# Patient Record
Sex: Female | Born: 1950 | Race: Black or African American | Hispanic: No | Marital: Single
Health system: Midwestern US, Community
[De-identification: ages and names within clinical notes are randomized; demographics above are authoritative.]

## PROBLEM LIST (undated history)

## (undated) DIAGNOSIS — Z9889 Other specified postprocedural states: Secondary | ICD-10-CM

## (undated) DIAGNOSIS — Z889 Allergy status to unspecified drugs, medicaments and biological substances status: Secondary | ICD-10-CM

## (undated) DIAGNOSIS — Z8719 Personal history of other diseases of the digestive system: Secondary | ICD-10-CM

## (undated) DIAGNOSIS — F32A Depression, unspecified: Secondary | ICD-10-CM

## (undated) DIAGNOSIS — T8859XA Other complications of anesthesia, initial encounter: Secondary | ICD-10-CM

## (undated) DIAGNOSIS — E039 Hypothyroidism, unspecified: Secondary | ICD-10-CM

## (undated) DIAGNOSIS — K219 Gastro-esophageal reflux disease without esophagitis: Secondary | ICD-10-CM

## (undated) DIAGNOSIS — T4145XA Adverse effect of unspecified anesthetic, initial encounter: Secondary | ICD-10-CM

## (undated) DIAGNOSIS — F419 Anxiety disorder, unspecified: Secondary | ICD-10-CM

## (undated) DIAGNOSIS — D649 Anemia, unspecified: Secondary | ICD-10-CM

## (undated) DIAGNOSIS — M810 Age-related osteoporosis without current pathological fracture: Secondary | ICD-10-CM

## (undated) DIAGNOSIS — H269 Unspecified cataract: Secondary | ICD-10-CM

## (undated) DIAGNOSIS — E079 Disorder of thyroid, unspecified: Secondary | ICD-10-CM

## (undated) DIAGNOSIS — R112 Nausea with vomiting, unspecified: Secondary | ICD-10-CM

## (undated) DIAGNOSIS — Z972 Presence of dental prosthetic device (complete) (partial): Secondary | ICD-10-CM

## (undated) DIAGNOSIS — B019 Varicella without complication: Secondary | ICD-10-CM

## (undated) DIAGNOSIS — M1990 Unspecified osteoarthritis, unspecified site: Secondary | ICD-10-CM

## (undated) DIAGNOSIS — Z8489 Family history of other specified conditions: Secondary | ICD-10-CM

## (undated) DIAGNOSIS — F329 Major depressive disorder, single episode, unspecified: Secondary | ICD-10-CM

## (undated) DIAGNOSIS — E785 Hyperlipidemia, unspecified: Secondary | ICD-10-CM

## (undated) DIAGNOSIS — E7800 Pure hypercholesterolemia, unspecified: Secondary | ICD-10-CM

## (undated) DIAGNOSIS — E119 Type 2 diabetes mellitus without complications: Secondary | ICD-10-CM

## (undated) DIAGNOSIS — M25551 Pain in right hip: Secondary | ICD-10-CM

## (undated) DIAGNOSIS — Z1231 Encounter for screening mammogram for malignant neoplasm of breast: Secondary | ICD-10-CM

## (undated) DIAGNOSIS — E876 Hypokalemia: Secondary | ICD-10-CM

## (undated) DIAGNOSIS — R197 Diarrhea, unspecified: Secondary | ICD-10-CM

## (undated) DIAGNOSIS — M545 Low back pain, unspecified: Secondary | ICD-10-CM

## (undated) DIAGNOSIS — I10 Essential (primary) hypertension: Secondary | ICD-10-CM

## (undated) DIAGNOSIS — Z471 Aftercare following joint replacement surgery: Secondary | ICD-10-CM

## (undated) DIAGNOSIS — K909 Intestinal malabsorption, unspecified: Secondary | ICD-10-CM

## (undated) DIAGNOSIS — Z01818 Encounter for other preprocedural examination: Secondary | ICD-10-CM

## (undated) DIAGNOSIS — E875 Hyperkalemia: Secondary | ICD-10-CM

## (undated) DIAGNOSIS — K912 Postsurgical malabsorption, not elsewhere classified: Secondary | ICD-10-CM

## (undated) DIAGNOSIS — G8929 Other chronic pain: Secondary | ICD-10-CM

## (undated) DIAGNOSIS — R0609 Other forms of dyspnea: Secondary | ICD-10-CM

## (undated) DIAGNOSIS — M1710 Unilateral primary osteoarthritis, unspecified knee: Secondary | ICD-10-CM

## (undated) DIAGNOSIS — M129 Arthropathy, unspecified: Secondary | ICD-10-CM

## (undated) DIAGNOSIS — J449 Chronic obstructive pulmonary disease, unspecified: Secondary | ICD-10-CM

## (undated) DIAGNOSIS — R55 Syncope and collapse: Secondary | ICD-10-CM

## (undated) DIAGNOSIS — G8918 Other acute postprocedural pain: Secondary | ICD-10-CM

## (undated) DIAGNOSIS — I739 Peripheral vascular disease, unspecified: Secondary | ICD-10-CM

## (undated) DIAGNOSIS — Z23 Encounter for immunization: Secondary | ICD-10-CM

## (undated) DIAGNOSIS — Z1211 Encounter for screening for malignant neoplasm of colon: Secondary | ICD-10-CM

## (undated) DIAGNOSIS — Z96659 Presence of unspecified artificial knee joint: Secondary | ICD-10-CM

## (undated) DIAGNOSIS — Z0000 Encounter for general adult medical examination without abnormal findings: Secondary | ICD-10-CM

## (undated) DIAGNOSIS — M24662 Ankylosis, left knee: Secondary | ICD-10-CM

## (undated) DIAGNOSIS — M1712 Unilateral primary osteoarthritis, left knee: Secondary | ICD-10-CM

## (undated) DIAGNOSIS — B372 Candidiasis of skin and nail: Secondary | ICD-10-CM

## (undated) DIAGNOSIS — K9089 Other intestinal malabsorption: Principal | ICD-10-CM

## (undated) DIAGNOSIS — E611 Iron deficiency: Secondary | ICD-10-CM

## (undated) DIAGNOSIS — Z780 Asymptomatic menopausal state: Principal | ICD-10-CM

## (undated) HISTORY — PX: EYE SURGERY: SHX253

## (undated) HISTORY — PX: THYROID SURGERY: SHX805

## (undated) HISTORY — DX: Anxiety disorder, unspecified: F41.9

## (undated) HISTORY — PX: HIP SURGERY: SHX245

## (undated) HISTORY — PX: CATARACT EXTRACTION: SUR2

## (undated) HISTORY — PX: FOOT SURGERY: SHX648

## (undated) HISTORY — DX: Unspecified cataract: H26.9

## (undated) HISTORY — PX: OTHER SURGICAL HISTORY: SHX169

## (undated) HISTORY — DX: Age-related osteoporosis without current pathological fracture: M81.0

---

## 1979-05-19 HISTORY — PX: TUBAL LIGATION: SHX77

## 1990-05-18 HISTORY — PX: TMJ ARTHROPLASTY: SHX1066

## 1999-04-03 ENCOUNTER — Other Ambulatory Visit: Admission: RE | Admit: 1999-04-03 | Discharge: 1999-04-03 | Payer: Self-pay | Admitting: Psychiatry

## 1999-07-15 ENCOUNTER — Other Ambulatory Visit: Admission: RE | Admit: 1999-07-15 | Discharge: 1999-07-15 | Payer: Self-pay | Admitting: *Deleted

## 1999-10-16 ENCOUNTER — Encounter: Payer: Self-pay | Admitting: Obstetrics and Gynecology

## 1999-10-20 ENCOUNTER — Encounter (INDEPENDENT_AMBULATORY_CARE_PROVIDER_SITE_OTHER): Payer: Self-pay

## 1999-10-20 ENCOUNTER — Inpatient Hospital Stay (HOSPITAL_COMMUNITY): Admission: EM | Admit: 1999-10-20 | Discharge: 1999-10-23 | Payer: Self-pay | Admitting: Obstetrics and Gynecology

## 1999-10-21 ENCOUNTER — Encounter: Payer: Self-pay | Admitting: Obstetrics and Gynecology

## 1999-10-30 ENCOUNTER — Inpatient Hospital Stay (HOSPITAL_COMMUNITY): Admission: AD | Admit: 1999-10-30 | Discharge: 1999-10-30 | Payer: Self-pay | Admitting: Obstetrics and Gynecology

## 2000-03-22 ENCOUNTER — Ambulatory Visit (HOSPITAL_COMMUNITY): Admission: RE | Admit: 2000-03-22 | Discharge: 2000-03-22 | Payer: Self-pay | Admitting: Family Medicine

## 2000-05-18 HISTORY — PX: ABDOMINAL HYSTERECTOMY: SHX81

## 2000-12-24 ENCOUNTER — Other Ambulatory Visit: Admission: RE | Admit: 2000-12-24 | Discharge: 2000-12-24 | Payer: Self-pay | Admitting: Obstetrics and Gynecology
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
## 2001-06-13 ENCOUNTER — Inpatient Hospital Stay (HOSPITAL_COMMUNITY): Admission: EM | Admit: 2001-06-13 | Discharge: 2001-06-17 | Payer: Self-pay | Admitting: *Deleted

## 2001-06-20 ENCOUNTER — Other Ambulatory Visit (HOSPITAL_COMMUNITY): Admission: RE | Admit: 2001-06-20 | Discharge: 2001-06-28 | Payer: Self-pay | Admitting: *Deleted

## 2001-07-29 NOTE — ED Provider Notes (Signed)
[facility]                      EMERGENCY DEPARTMENT TREATMENT REPORT   NAME:  Cheryl Paul, Cheryl Paul   MR #:         BILLING #: [id number]          DOS: 07/29/2001   TIME:10:18 P   [id number]   cc:    Darnelle Catalan, M.D.   Primary Physician:  Darnelle Catalan, M.D.   The patient was evaluated at 2150 hours.   CHIEF COMPLAINT:  Abdominal pain.   HISTORY OF PRESENT ILLNESS:  Cheryl Paul is a 51 year old female with a 1-2   month history of generalized abdominal pain.  It has become worse over the   last 2-3 weeks.  She describes it as a crampy pain in her upper abdomen.   She also has vomiting 2-3 times per day and diarrhea 1 loose nonformed   stool per day.  She does have nausea, admits to subjective chills but no   fevers.  Her symptoms are worse at nighttime when she is lying down.  She   has no chest pain or difficulty breathing.  She has an appointment with Dr.   Shon Baton, a GI physician, on March 18th.  She denies dysuria.  Her abdomen   does not feel distended.   REVIEW OF SYSTEMS:   CONSTITUTIONAL:  Positive chills.  No weight loss, no fevers.   ENT: No sore throat, runny nose or other URI symptoms.   ENDOCRINE:  No diabetic symptoms.   HEMATOLOGIC/LYMPHATIC:  No excessive bruising or lymph node swelling.   RESPIRATORY:  No cough, shortness of breath, or wheezing.   CARDIOVASCULAR:  No chest pain, chest pressure, or palpitations.   GENITOURINARY:  No dysuria, frequency, or urgency.   NEUROLOGICAL:  No headaches, sensory or motor symptoms.   PAST MEDICAL HISTORY:   Gastroesophageal reflux disease, asthma.   MEDICATIONS:  Protonix, albuterol, Proventil, Pepcid.  She was on this   twice a day, but is currently taking it as needed for the last 2-3 weeks.   ALLERGIES:  None.   SOCIAL HISTORY:  Negative.   PAST SURGICAL HISTORY:  Multiple abdominal surgeries including a recent   repair of incarcerated hernia 2 months ago.   PHYSICAL EXAMINATION:    VITAL SIGNS:  Blood pressure 169/85, pulse 77, respirations 20, temperature   99.2.  Pain 9/10.   GENERAL:  This is an obese black female, laying on her side and rubbing her   stomach.   HEENT:  Eyes:  Conjunctivae clear, lids normal.  Pupils equal, symmetrical,   and normally reactive.    Ears/Nose:  Hearing is grossly intact to voice.   Internal and external examinations of the ears are unremarkable.   Mouth/Throat:  Surfaces of the pharynx, palate, and tongue are pink, moist,   and without lesions.   NECK:  Supple, nontender, symmetrical, no masses or JVD, trachea midline,   thyroid not enlarged, nodular, or tender.   LYMPHATICS:   No cervical or submandibular lymphadenopathy palpated.   RESPIRATORY:  Clear and equal BS.  No respiratory distress, tachypnea, or   accessory muscle use.   CARDIOVASCULAR:  Heart regular, without murmurs, gallops, rubs, or thrills.   PMI not displaced.   No peripheral edema or significant varicosities.   GI:  Abdomen soft, nontender, without complaint of pain to palpation.  No   hepatomegaly or splenomegaly.  No abdominal or inguinal  masses appreciated   by inspection or palpation.   Rectal:  No masses or hemorrhoids.  Sphincter tone is normal.   Stool   brown, guaiac negative.   MUSCULOSKELETAL:  Nails:  No clubbing or deformities.  Nailbeds pink with   prompt capillary refill.   SKIN:  Warm and dry without rashes.   IMPRESSION/MANAGEMENT PLAN:  This is an acute exacerbation of a chronic   condition for this patient.  The patient with intermittent abdominal pain   for the last 2-3 months, scheduled to see a GI physician in 4 days.  She   has changed her Pepcid from twice a day to as needed.  Symptoms do sound   reflux in etiology.  They are not consistent with small bowel obstruction.   She does not have an acute abdomen on examination.  At this point I will be   treating her symptoms and following her examination in the emergency   department.  Nursing notes were reviewed.    CONTINUATION BY DR. Denny Levy:   COURSE IN THE EMERGENCY DEPARTMENT:  The patient was given 1 liter of IV   fluids.  She was also given Phenergan 25 mg IV.  She had no further   vomiting in the emergency department. Her nausea resolved. Her abdominal   exam remained soft with no rebound or guarding.   FINAL DIAGNOSIS:  Acute exacerbation of chronic epigastric abdominal pain.   DISPOSITION:  The patient is discharged home in stable condition, with   instructions to follow up with their regular doctor.  They are advised to   return immediately for any worsening or symptoms of concern. She is to   follow up with Dr. Shon Baton as scheduled on March 18.  She is to return to   the   emergency department for significantly worsening pain, vomiting, or fevers.   She was given a prescription for Phenergan 25 mg 1 every 6 hours as needed   for nausea and vomiting.   Electronically Signed By:   Octavia Bruckner, M.D. 07/31/2001 06:30   ____________________________   Octavia Bruckner, M.D.   jb/cd  D:  07/29/2001  T:  07/30/2001  3:32 P   [id number]

## 2002-02-12 NOTE — ED Provider Notes (Signed)
[facility]                      EMERGENCY DEPARTMENT TREATMENT REPORT   NAME:  Cheryl Paul, Cheryl Paul                        PT. LOCATION:     ER  [phone]   MR #:         BILLING #: [id number]          DOS: 02/12/2002   TIME: 6:35 P   32-81-01   cc:  Colbert Ewing, M.D.   Primary Physician:   CHIEF COMPLAINT:   Nausea, vomiting, diarrhea, and abdominal pain.   HISTORY OF PRESENT ILLNESS:   51-year-old woman started on the 26th with   nausea, followed by vomiting and diarrhea.  She has some diffuse crampy   abdominal pain that gets better with emesis or diarrhea.  No localized   abdominal pain.  Was lightheaded with standing earlier, not at this moment.   Has vomited 5 or 6 times today.  Has had 2 episodes of diarrhea, no blood   or dark material at either end.   ADDITIONAL REVIEW OF SYSTEMS:   GENITOURINARY:  No dysuria, frequency, or urgency.   CONSTITUTIONAL:  No fever, chills, weight loss.   ENT: No sore throat, runny nose or other URI symptoms.   RESPIRATORY:  No cough, shortness of breath, or wheezing.   CARDIOVASCULAR:  No chest pain, chest pressure, or palpitations.   MUSCULOSKELETAL:   No injuries.   Denies complaints in any other system.   PAST MEDICAL HISTORY:   She has asthma and bronchitis.  States her asthma   is doing well.   PAST SURGICAL HISTORY:  Multiple including hysterectomy.   FAMILY HISTORY:   No one else ill.   SOCIAL HISTORY:  Negative for current tobacco or alcohol use.   ALLERGIES:    None.   MEDICATIONS:   Albuterol p.r.n.   PHYSICAL EXAMINATION:   VITAL SIGNS:   Blood pressure 109/06, pulse 87, respirations 16,   temperature 99.3, oxygen saturation not recorded.   GENERAL:  This is an obese 51 year old woman in minimal distress.   HEENT:  Pupils equal and reactive, extraocular movements normal.  Nose and   throat unremarkable.   NECK:  Supple, no significant cervical lymphadenopathy.  No JVD.    LUNGS:  Clear and equal breath sounds.  No respiratory distress or   tachypnea.   HEART:  Regular without significant murmurs, gallops, or rubs.   BACK:  No CVAT.   ABDOMEN:  Soft with mild diffuse and inconsistent complaints of pain to   palpation. No tenderness, rebound tenderness, masses.   EXTREMITIES:   Calves soft and nontender.  No peripheral edema.  Pulses   satisfactory.   SKIN:  Warm and dry without rashes.   PSYCHIATRIC:   Judgment appears appropriate.  Recent and remote memory   appear to be intact.   IMPRESSION/MANAGEMENT PLAN:   Acute gastroenteritis with volume depletion   by history.  We will provide some IV fluids, antiemetics and antidiarrheals   and reassess.  We will also evaluate for electrolyte abnormalities and   anemia.   CONTINUATION BY DR. FRUMKIN:   COURSE IN THE EMERGENCY DEPARTMENT:  On re-evaluation at 1934 hours, the   patient had no more nausea or vomiting and no diarrhea.  She still had   about a half liter of  IV to go.  At 2100 hours she vomited after some   Mylanta, her pain was better, and she was nontender at that point.  After   that point, she had been given IV fluids, Reglan, Imodium and sips of   liquids.  She was subsequently given Benadryl, Haloperidol, Carafate, and   Mylanta, followed by the vomiting episode.  Subsequently given 12.5 mg of   IV Phenergan with complete resolution of her symptoms.  At the completion   of her therapy, she had no diarrhea, no nausea, no abdominal pain, no   tenderness.   DIAGNOSTIC IMPRESSIONS:   1.   Acute gastroenteritis with volume depletion.   2. Abdominal pain secondary to #1, improved.   DISPOSITION:  The patient was given verbal and written instructions in the   signs and symptoms of gastrointestinal bleeding and the use of liquid   antacids.    To return specifically for fever, continued vomiting,   increasing pain or orthostasis.  Referred back to her private physician if   unimproved in    1 day.  Return to work 10-01.  Prescription:  Phenergan suppositories 25   mg, #2, with 2 refills.  The patient is discharged with verbal and written   instructions and a referral for ongoing care.  The patient is aware that   they may return at any time for new or worsening symptoms.   Electronically Signed By:   Shanna Cisco, M.D. 02/16/2002 11:21   ____________________________   Shanna Cisco, M.D.   ec/jb  D:  02/12/2002  T:  02/13/2002  3:15 P   [id number]

## 2002-03-17 ENCOUNTER — Other Ambulatory Visit: Admission: RE | Admit: 2002-03-17 | Discharge: 2002-03-17 | Payer: Self-pay | Admitting: Obstetrics and Gynecology

## 2002-08-30 ENCOUNTER — Encounter: Admission: RE | Admit: 2002-08-30 | Discharge: 2002-08-30 | Payer: Self-pay | Admitting: Family Medicine

## 2002-08-30 ENCOUNTER — Encounter: Payer: Self-pay | Admitting: Family Medicine

## 2002-11-20 ENCOUNTER — Encounter: Payer: Self-pay | Admitting: Emergency Medicine

## 2002-11-20 ENCOUNTER — Emergency Department (HOSPITAL_COMMUNITY): Admission: EM | Admit: 2002-11-20 | Discharge: 2002-11-21 | Payer: Self-pay | Admitting: *Deleted

## 2002-12-06 ENCOUNTER — Ambulatory Visit (HOSPITAL_COMMUNITY): Admission: RE | Admit: 2002-12-06 | Discharge: 2002-12-06 | Payer: Self-pay | Admitting: Neurology

## 2003-04-16 ENCOUNTER — Other Ambulatory Visit: Admission: RE | Admit: 2003-04-16 | Discharge: 2003-04-16 | Payer: Self-pay | Admitting: Obstetrics and Gynecology

## 2004-04-29 ENCOUNTER — Other Ambulatory Visit: Admission: RE | Admit: 2004-04-29 | Discharge: 2004-04-29 | Payer: Self-pay | Admitting: Obstetrics and Gynecology

## 2004-05-18 HISTORY — PX: CHOLECYSTECTOMY: SHX55

## 2005-03-11 ENCOUNTER — Encounter (INDEPENDENT_AMBULATORY_CARE_PROVIDER_SITE_OTHER): Payer: Self-pay | Admitting: Specialist

## 2005-03-11 ENCOUNTER — Ambulatory Visit (HOSPITAL_COMMUNITY): Admission: RE | Admit: 2005-03-11 | Discharge: 2005-03-12 | Payer: Self-pay | Admitting: General Surgery

## 2005-04-13 ENCOUNTER — Ambulatory Visit (HOSPITAL_COMMUNITY): Admission: RE | Admit: 2005-04-13 | Discharge: 2005-04-13 | Payer: Self-pay | Admitting: General Surgery

## 2005-05-04 ENCOUNTER — Ambulatory Visit (HOSPITAL_COMMUNITY): Admission: RE | Admit: 2005-05-04 | Discharge: 2005-05-04 | Payer: Self-pay | Admitting: Gastroenterology

## 2005-05-13 ENCOUNTER — Ambulatory Visit (HOSPITAL_COMMUNITY): Admission: RE | Admit: 2005-05-13 | Discharge: 2005-05-13 | Payer: Self-pay | Admitting: Gastroenterology

## 2005-05-18 HISTORY — PX: LIVER BIOPSY: SHX301

## 2005-05-31 ENCOUNTER — Emergency Department (HOSPITAL_COMMUNITY): Admission: EM | Admit: 2005-05-31 | Discharge: 2005-05-31 | Payer: Self-pay | Admitting: Emergency Medicine

## 2005-06-09 ENCOUNTER — Other Ambulatory Visit: Admission: RE | Admit: 2005-06-09 | Discharge: 2005-06-09 | Payer: Self-pay | Admitting: Obstetrics and Gynecology

## 2005-06-16 ENCOUNTER — Ambulatory Visit (HOSPITAL_COMMUNITY): Admission: RE | Admit: 2005-06-16 | Discharge: 2005-06-16 | Payer: Self-pay | Admitting: Gastroenterology

## 2005-11-13 ENCOUNTER — Encounter (INDEPENDENT_AMBULATORY_CARE_PROVIDER_SITE_OTHER): Payer: Self-pay | Admitting: Specialist

## 2005-11-13 ENCOUNTER — Ambulatory Visit (HOSPITAL_COMMUNITY): Admission: RE | Admit: 2005-11-13 | Discharge: 2005-11-13 | Payer: Self-pay | Admitting: Gastroenterology

## 2006-06-24 ENCOUNTER — Ambulatory Visit: Payer: Self-pay | Admitting: Cardiovascular Disease

## 2007-02-28 ENCOUNTER — Ambulatory Visit: Payer: Self-pay | Admitting: Internal Medicine

## 2007-03-07 ENCOUNTER — Ambulatory Visit: Payer: Self-pay

## 2007-03-21 ENCOUNTER — Ambulatory Visit: Payer: Self-pay | Admitting: Internal Medicine

## 2007-05-04 ENCOUNTER — Encounter: Admission: RE | Admit: 2007-05-04 | Discharge: 2007-05-04 | Payer: Self-pay | Admitting: Family Medicine

## 2007-08-01 ENCOUNTER — Encounter: Admission: RE | Admit: 2007-08-01 | Discharge: 2007-08-01 | Payer: Self-pay | Admitting: Obstetrics and Gynecology

## 2007-08-08 ENCOUNTER — Ambulatory Visit (HOSPITAL_COMMUNITY): Admission: RE | Admit: 2007-08-08 | Discharge: 2007-08-08 | Payer: Self-pay | Admitting: Physician Assistant

## 2007-08-15 DIAGNOSIS — R0789 Other chest pain: Secondary | ICD-10-CM | POA: Insufficient documentation

## 2007-08-15 DIAGNOSIS — J449 Chronic obstructive pulmonary disease, unspecified: Secondary | ICD-10-CM | POA: Insufficient documentation

## 2007-08-15 DIAGNOSIS — F17200 Nicotine dependence, unspecified, uncomplicated: Secondary | ICD-10-CM | POA: Insufficient documentation

## 2007-08-15 DIAGNOSIS — J4489 Other specified chronic obstructive pulmonary disease: Secondary | ICD-10-CM | POA: Insufficient documentation

## 2007-08-16 ENCOUNTER — Ambulatory Visit: Payer: Self-pay | Admitting: Internal Medicine

## 2007-08-16 DIAGNOSIS — J984 Other disorders of lung: Secondary | ICD-10-CM | POA: Insufficient documentation

## 2007-08-23 ENCOUNTER — Telehealth: Payer: Self-pay | Admitting: Internal Medicine

## 2007-08-24 ENCOUNTER — Ambulatory Visit: Payer: Self-pay | Admitting: Internal Medicine

## 2007-09-15 ENCOUNTER — Telehealth (INDEPENDENT_AMBULATORY_CARE_PROVIDER_SITE_OTHER): Payer: Self-pay | Admitting: *Deleted

## 2007-09-19 ENCOUNTER — Ambulatory Visit: Payer: Self-pay | Admitting: Internal Medicine

## 2007-10-07 ENCOUNTER — Ambulatory Visit: Payer: Self-pay | Admitting: Internal Medicine

## 2008-05-24 LAB — METABOLIC PANEL, COMPREHENSIVE
A-G Ratio: 1.3 (ref 0.8–1.7)
ALT (SGPT): 36 U/L (ref 30–65)
AST (SGOT): 15 U/L (ref 15–37)
Albumin: 4.3 g/dL (ref 3.4–5.0)
Alk. phosphatase: 86 U/L (ref 50–136)
Anion gap: 8 mmol/L (ref 5–15)
BUN/Creatinine ratio: 19 (ref 12–20)
BUN: 13 MG/DL (ref 7–18)
Bilirubin, total: 0.6 MG/DL (ref 0.1–0.9)
CO2: 31 MMOL/L (ref 21–32)
Calcium: 9.5 MG/DL (ref 8.4–10.4)
Chloride: 103 MMOL/L (ref 100–108)
Creatinine: 0.7 MG/DL (ref 0.6–1.3)
GFR est AA: >60 mL/min/{1.73_m2} (ref >60)
GFR est non-AA: >60 mL/min/{1.73_m2} (ref >60)
Globulin: 3.3 g/dL (ref 2.0–4.0)
Glucose: 138 MG/DL — ABNORMAL HIGH (ref 74–99)
Potassium: 4.6 MMOL/L (ref 3.5–5.5)
Protein, total: 7.6 g/dL (ref 6.4–8.2)
Sodium: 142 MMOL/L (ref 136–145)

## 2008-05-24 LAB — CBC WITH AUTOMATED DIFF
ABS. EOSINOPHILS: 0.3 10*3/uL (ref 0.0–0.4)
ABS. LYMPHOCYTES: 2.3 10*3/uL (ref 0.8–3.5)
ABS. MONOCYTES: 0.4 10*3/uL (ref 0–1.0)
ABS. NEUTROPHILS: 5.4 10*3/uL (ref 1.8–8.0)
BASOPHILS: 0 % (ref 0–3)
EOSINOPHILS: 4 % (ref 0–5)
HCT: 43.2 % (ref 36.0–46.0)
HGB: 13.9 g/dL (ref 12.0–16.0)
LYMPHOCYTES: 27 % (ref 20–51)
MCH: 26.9 PG (ref 25.0–35.0)
MCHC: 32.1 g/dL (ref 31.0–37.0)
MCV: 83.7 FL (ref 78.0–102.0)
MONOCYTES: 5 % (ref 2–9)
MPV: 10.9 FL — ABNORMAL HIGH (ref 7.4–10.4)
NEUTROPHILS: 64 % (ref 42–75)
PLATELET: 180 10*3/uL (ref 130–400)
RBC: 5.16 M/uL — ABNORMAL HIGH (ref 4.10–5.10)
RDW: 14.9 % — ABNORMAL HIGH (ref 11.5–14.5)
WBC: 8.4 10*3/uL (ref 4.5–13.0)

## 2008-05-24 LAB — URINALYSIS W/ RFLX MICROSCOPIC
Bilirubin: NEGATIVE
Blood: NEGATIVE
Glucose: NEGATIVE MG/DL
Ketone: NEGATIVE MG/DL
Leukocyte Esterase: NEGATIVE
Nitrites: NEGATIVE
Protein: NEGATIVE MG/DL
Specific gravity: 1.025 (ref 1.003–1.030)
Urobilinogen: 0.2 EU/DL (ref 0.2–1.0)
pH (UA): 5.5 (ref 5.0–8.0)

## 2008-05-24 LAB — LIPID PANEL
CHOL/HDL Ratio: 5.4 — ABNORMAL HIGH (ref 0–5.0)
Cholesterol, total: 260 MG/DL — ABNORMAL HIGH (ref 0–200)
HDL Cholesterol: 48 MG/DL (ref 40–60)
LDL, calculated: 172.8 MG/DL — ABNORMAL HIGH (ref 0–100)
LDL/HDL Ratio: 3.6
Triglyceride: 196 MG/DL — ABNORMAL HIGH (ref 0–150)
VLDL, calculated: 39.2 MG/DL

## 2008-05-24 LAB — HEMOGLOBIN A1C WITH EAG: Hemoglobin A1c: 7.2 % — ABNORMAL HIGH (ref 4.8–6.0)

## 2008-05-24 LAB — TSH 3RD GENERATION: TSH: 1.97 u[IU]/mL (ref 0.51–6.27)

## 2008-05-25 LAB — VITAMIN D, 25 HYDROXY: Vitamin D 25-Hydroxy: 21 ng/mL — ABNORMAL LOW (ref 30–80)

## 2008-08-27 ENCOUNTER — Ambulatory Visit (HOSPITAL_COMMUNITY): Admission: RE | Admit: 2008-08-27 | Discharge: 2008-08-27 | Payer: Self-pay | Admitting: Obstetrics and Gynecology

## 2008-09-03 ENCOUNTER — Encounter: Admission: RE | Admit: 2008-09-03 | Discharge: 2008-09-03 | Payer: Self-pay | Admitting: Neurology

## 2008-09-13 ENCOUNTER — Telehealth: Payer: Self-pay | Admitting: Internal Medicine

## 2008-09-19 ENCOUNTER — Ambulatory Visit: Payer: Self-pay | Admitting: Cardiovascular Disease

## 2008-09-24 ENCOUNTER — Ambulatory Visit: Payer: Self-pay | Admitting: Internal Medicine

## 2008-09-24 DIAGNOSIS — K449 Diaphragmatic hernia without obstruction or gangrene: Secondary | ICD-10-CM | POA: Insufficient documentation

## 2008-09-24 DIAGNOSIS — K219 Gastro-esophageal reflux disease without esophagitis: Secondary | ICD-10-CM | POA: Insufficient documentation

## 2008-10-05 ENCOUNTER — Encounter: Admission: RE | Admit: 2008-10-05 | Discharge: 2008-10-05 | Payer: Self-pay | Admitting: Family Medicine

## 2008-10-31 ENCOUNTER — Ambulatory Visit: Payer: Self-pay | Admitting: Internal Medicine

## 2008-11-15 ENCOUNTER — Telehealth: Payer: Self-pay | Admitting: Internal Medicine

## 2008-11-26 ENCOUNTER — Ambulatory Visit: Payer: Self-pay | Admitting: Internal Medicine

## 2008-12-03 ENCOUNTER — Telehealth (INDEPENDENT_AMBULATORY_CARE_PROVIDER_SITE_OTHER): Payer: Self-pay | Admitting: *Deleted

## 2008-12-27 ENCOUNTER — Ambulatory Visit: Payer: Self-pay | Admitting: Psychology

## 2009-02-14 ENCOUNTER — Ambulatory Visit: Payer: Self-pay | Admitting: Psychology

## 2009-04-12 ENCOUNTER — Emergency Department (HOSPITAL_COMMUNITY): Admission: EM | Admit: 2009-04-12 | Discharge: 2009-04-12 | Payer: Self-pay | Admitting: Emergency Medicine

## 2009-07-22 ENCOUNTER — Encounter: Admission: RE | Admit: 2009-07-22 | Discharge: 2009-07-22 | Payer: Self-pay | Admitting: Family Medicine

## 2009-08-14 ENCOUNTER — Telehealth (INDEPENDENT_AMBULATORY_CARE_PROVIDER_SITE_OTHER): Payer: Self-pay | Admitting: *Deleted

## 2009-08-20 ENCOUNTER — Other Ambulatory Visit: Admission: RE | Admit: 2009-08-20 | Discharge: 2009-08-20 | Payer: Self-pay | Admitting: Interventional Radiology

## 2009-08-20 ENCOUNTER — Encounter: Admission: RE | Admit: 2009-08-20 | Discharge: 2009-08-20 | Payer: Self-pay | Admitting: Family Medicine

## 2009-10-04 ENCOUNTER — Emergency Department (HOSPITAL_COMMUNITY): Admission: EM | Admit: 2009-10-04 | Discharge: 2009-10-04 | Payer: Self-pay | Admitting: Emergency Medicine

## 2009-10-07 ENCOUNTER — Encounter: Admission: RE | Admit: 2009-10-07 | Discharge: 2009-10-07 | Payer: Self-pay | Admitting: Neurological Surgery

## 2009-10-23 ENCOUNTER — Encounter (INDEPENDENT_AMBULATORY_CARE_PROVIDER_SITE_OTHER): Payer: Self-pay | Admitting: Surgery

## 2009-10-23 ENCOUNTER — Ambulatory Visit (HOSPITAL_COMMUNITY): Admission: RE | Admit: 2009-10-23 | Discharge: 2009-10-24 | Payer: Self-pay | Admitting: Surgery

## 2010-03-24 ENCOUNTER — Telehealth (INDEPENDENT_AMBULATORY_CARE_PROVIDER_SITE_OTHER): Payer: Self-pay | Admitting: *Deleted

## 2010-03-27 ENCOUNTER — Ambulatory Visit (HOSPITAL_COMMUNITY): Admission: RE | Admit: 2010-03-27 | Discharge: 2010-03-28 | Payer: Self-pay | Admitting: Orthopedic Surgery

## 2010-05-18 HISTORY — PX: NECK SURGERY: SHX720

## 2010-06-08 ENCOUNTER — Encounter: Payer: Self-pay | Admitting: Internal Medicine

## 2010-06-17 NOTE — Progress Notes (Signed)
Summary: rx req/ cough/ sob  Phone Note Call from Patient   Caller: Patient Call For: ramaswamy Summary of Call: cough "a little better" but still not coughing anything up. also sob. req another "round" of antibiotics. [facility] [phone]. pt cell# [phone]. Initial call taken by: Tivis Ringer,  August 23, 2007 9:06 AM  Follow-up for Phone Call        Pt states her cough is better but she still can't cough up any mucus. She would like another round of the Doxycycline if MR feels it would help. SOB is about the same. Please advise...................................................................Marland KitchenMichel Bickers [facility]  August 23, 2007 10:47 AM  Additional Follow-up for Phone Call Additional follow up Details #1::        Pls check if she is taking spiriva as well. In 03/2007 I had advised spiriva and she forgot about it. Also, sh can have another 5 days of doxycycline 100mg  by mouth two times a day without refills. Please call it in. Next, she is very likely in copd exacerbation. Probably needs prednisone. Can she make a visit today? Or, tommorrow to me or Tammy Parrett? ...................................................................Kalman Shan MD  August 23, 2007 11:29 AM  Additional Follow-up by: Kalman Shan MD,  August 23, 2007 11:29 AM    Additional Follow-up for Phone Call Additional follow up Details #2::    Pt is using Spiriva. She is aware we will call Doxy 100mg  #10 two times a day to [facility]. She is scheduled to see TP on 08-24-07 for ?COPD execerbation per MR. Follow-up by: Michel Bickers CMA,  August 23, 2007 11:49 AM  New/Updated Medications: DOXYCYCLINE HYCLATE 100 MG  TABS (DOXYCYCLINE HYCLATE) 1 by mouth two times a day   Prescriptions: DOXYCYCLINE HYCLATE 100 MG  TABS (DOXYCYCLINE HYCLATE) 1 by mouth two times a day  #10 x 0   Entered by:   Michel Bickers CMA   Authorized by:   Kalman Shan MD   Signed by:   Michel Bickers CMA on 08/23/2007   Method used:    Telephoned to ...       [facility]       [address]       [place], [place]  [address]       Ph: [phone] or [phone]       Fax: [phone]   RxID:   [id number]

## 2010-06-17 NOTE — Assessment & Plan Note (Signed)
Summary: 26yr rov/ct chest@[facility]  09/19/08   Visit Type:  Follow-up Copy to:  Dr. Marisue Brooklyn Primary Provider/Referring Provider:  Dr. Marisue Brooklyn  CC:  Pt here to review CT results..  History of Present Illness: Followup for  02/2007 micronodules seen on CT chest at [facility], COPD  (isolated low dlco, seen on ct chest), and quitting smoking, rt atypical chest pain.  #Smoking - quit smoking 02/06/2008 with help of wellbutrin and psychiatris. has gained weight since  #Rt sided chest pain -  Present in rt interscapular region and right mammary pain (feels like straight to back). Being followed by PCP. No change  #micronodules - had ct 09/19/2008 that was  a followup for lingula nodule when compared to 09/19/2007. THis nodule is regressd now and is a scar but current CT shows new RML pna patch compared to 09/19/2007. However, she has had no pneumonia symptoms. Insstead she is c/o copious GERD with gagging and vomitus. Prevacid as needed has helped. Mom is very concerned about this because her dad died from lung cancer in 2008/05/16.  Mom is also worried that she is aspirating silently. NOTE: I personally reviewed this CT. Info updated in past medical hx  #COPD - Not dyspneic. No wheezing. Does not want to try new inhalers due to lack of symptoms and prior intolerance in form of lip swelling to spiriva.    Preventive Screening-Counseling & Management     Smoking Status: quit     Year Quit: 01/2008  Current Medications (verified): 1)  Estrace 2 Mg  Tabs (Estradiol) .... Take 1 Tablet By Mouth Once A Day 2)  Trazodone Hcl 100 Mg  Tabs (Trazodone Hcl) .... Take 1 Tab By Mouth At Bedtime 3)  Levothroid 25 Mcg  Tabs (Levothyroxine Sodium) .... Take 1 Tablet By Mouth Once A Day 4)  Centrum Silver   Tabs (Multiple Vitamins-Minerals) .... Take 1 Tablet By Mouth Once A Day 5)  Calcium 500 500 Mg  Tabs (Calcium Carbonate) .... Take 1 Tablet By Mouth Two Times A Day 6)  Cymbalta 60 Mg Cpep (Duloxetine Hcl) ....  Take 1 Tablet By Mouth Once A Day 7)  Sleeping Pill? 8)  B-12 1000 Mcg Cr-Tabs (Cyanocobalamin) .... Take 1 Tablet By Mouth Once A Day  Allergies (verified): 1)  ! * Lithium 2)  ! Morphine 3)  ! Sudafed 4)  ! Prednisone  Past History:  Past Medical History:    #PULMONARY NODULE (ICD-518.89)..........Marland KitchenRamswamy    -> CT 10/20098 @ [facility]; Many micronodule <38mm. Largest 5mm    -> CT 05-17-07 - resolution of nodules    -> CT 09/19/2007 - resolution of noudles above but new Lingular nodule 5mm or so    -> CT 09/19/2008 - lingular nodules has regressed/nearly resolved to a scar. Has new RML pneumonic patch    #C O P D (ICD-496).......Marland KitchenRamaswamy    -> dx based on ilsoated low DLCO + CT chest findings    -> intoleratnt to spiriva (had lip swelling)    -> not on inhaler RX    #TOBACCO ABUSE (ICD-305.1)    -> quit 02/06/2008 with wellbutrin    CHEST PAIN, ATYPICAL (ICD-786.59)    COPD (ICD-496)       Family History:    Reviewed history and no changes required:  Social History:    Reviewed history and no changes required:    Smoking Status:  quit  Review of Systems      See HPI  Vital Signs:  Patient profile:   60 year old female Height:      63 inches Weight:      136 pounds BMI:     24.18 O2 Sat:      97 % Temp:     97.5 degrees F oral Pulse rate:   90 / minute BP sitting:   98 / 60  (right arm) Cuff size:   regular  Vitals Entered By: Carron Curie CMA (Sep 24, 2008 11:27 AM)  O2 Sat at Rest %:  97 O2 Flow:  room air CC: Pt here to review CT results.   Physical Exam  General:  thin.   Head:  normocephalic and atraumatic Eyes:  PERRLA/EOM intact; conjunctiva and sclera clear Ears:  TMs intact and clear with normal canals Nose:  no deformity, discharge, inflammation, or lesions Mouth:  no deformity or lesions Neck:  no masses, thyromegaly, or abnormal cervical nodes Chest Wall:  no deformities noted Lungs:  clear bilaterally to auscultation and  percussion Heart:  regular rate and rhythm, S1, S2 without murmurs, rubs, gallops, or clicks Abdomen:  soft. normal bowel sounds. no mass Msk:  no deformity or scoliosis noted with normal posture Pulses:  pulses normal Extremities:  no clubbing, cyanosis, edema, or deformity noted Neurologic:  CN II-XII grossly intact with normal reflexes, coordination, muscle strength and tone Skin:  intact without lesions or rashes Cervical Nodes:  no significant adenopathy Psych:  alert and cooperative; normal mood and affect; normal attention span and concentration   Impression & Recommendations:  Problem # 1:  PULMONARY NODULE (ICD-518.89) Assessment Deteriorated -> CT 10/20098 @ [facility]; Many micronodule <46mm. Largest 5mm -> CT 04/2007 - resolution of nodules -> CT 09/19/2007 - resolution of noudles above but new Lingular nodule 5mm or so -> CT 09/19/2008 - lingular nodules has regressed/nearly resolved to a scar. Has NEW RML pneumonic patch (Patient asymptomatic exceot for GERD.)  PLAN Repeat ct chest in 6 weeks (mom is very concerned that this is lung cancer v aspiration) No abx Rx as patient asymptomatic from infection standpoint Rx GERD with prevacid Orders: Radiology Referral (Radiology) Est. Patient Level III (16109)  Problem # 2:  G E REFLUX (ICD-530.81) Assessment: New  Having new GERD  ? cause. Sound severe.   plan recommended GI referral but mom refused they will try daily prevacid first  Orders: Est. Patient Level III (60454)  Problem # 3:  C O P D (ICD-496) Assessment: Unchanged  Currently asymptomatic. Intolerant to spiriva. Refuses LABA.   PLAN expectant followup  Problem # 4:  TOBACCO ABUSE (ICD-305.1) Assessment: Improved quit 9/21/20009 Orders: Radiology Referral (Radiology) Est. Patient Level III (09811)  Medications Added to Medication List This Visit: 1)  Cymbalta 60 Mg Cpep (Duloxetine hcl) .... Take 1 tablet by mouth once a day 2)  Sleeping Pill?  3)   B-12 1000 Mcg Cr-tabs (Cyanocobalamin) .... Take 1 tablet by mouth once a day  Patient Instructions: 1)  congratulations on quitting smoking 2)  ct scan chest without contrast 6 weeks from 09/19/2008 3)  take prevacid daily 4)  return after ct chest

## 2010-06-17 NOTE — Progress Notes (Signed)
Summary: fax request  Phone Note From Other Clinic   Caller: donna w/ dr Winfred Burn Call For: [facility] Summary of Call: please fax CT results from 10/31/08 to: attn: donna [phone]. contact # is [phone] Initial call taken by: Tivis Ringer, CNA,  August 14, 2009 10:15 AM  Follow-up for Phone Call        faxed ct/Juanita Follow-up by: Darletta Moll,  August 14, 2009 11:34 AM

## 2010-06-17 NOTE — Assessment & Plan Note (Signed)
Summary: ROV   Visit Type:  Follow-up Referred by:  Dr. Marisue Brooklyn PCP:  Dr. Marisue Brooklyn  Chief Complaint:  fu visit....from CT on May 5th...Marland KitchenMarland KitchenMarland Kitchenreviewed meds....stopped Spiriva....breaking mouth out.  History of Present Illness: Followup for  02/2007 micronodules seen on CT chest at [facility], COPD  (isolated low dlco, seen on ct chest), and quitting smoking, rt atypical chest pain.  #Smoking - was started on chantix by psych but was intolerant to it. So, she discontinued. Subsequently on zyban but was intolerant to that too. Currently, smoking but not any antismoking meds. Sill smoking 1 pack per days  #Rt sided chest pain -  iPresent in rt interscapular region and right mammary pain (feels like straight to back). Been told by PCP it is costochondritis. Being followed by PCP  #micronodules - seen on CT 02/2007 done at [facility] (mostly < 4mm. LArgest 5mm). Had followup CT in Dec 2008 (ordered by Dr. Althea Charon) and May 2009 (ordered by me)  #COPD - in interim, tried spiriva but had tongue swelling despite advice to do mouth wash. So, quit taking spiriva. Not dyspneic. No wheezing. Does not want to try new inhalers due to lack of symptoms  #OVerall - losing weight and mom is very concerned that depression is getting worse. PAtient denies suicidal ideation, hallucination and homicidal thoughts. 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       Updated Prior Medication List: ESTRACE 2 MG  TABS (ESTRADIOL) Take 1 tablet by mouth once a day TRAZODONE HCL 100 MG  TABS (TRAZODONE HCL) Take 1 tab by mouth at bedtime LEVOTHROID 25 MCG  TABS (LEVOTHYROXINE SODIUM) Take 1 tablet by mouth once a day CELEXA 20 MG  TABS (CITALOPRAM HYDROBROMIDE) take 60 mg once daily CENTRUM SILVER   TABS (MULTIPLE VITAMINS-MINERALS) Take 1 tablet by mouth once a day CALCIUM 500 500 MG  TABS (CALCIUM CARBONATE) Take 1 tablet by mouth two times a day VOLTAREN 75 MG  TBEC (DICLOFENAC SODIUM)  two times a day  Current Allergies (reviewed today): ! * LITHIUM ! MORPHINE ! SUDAFED ! PREDNISONE  Past Medical History:    Reviewed history from 08/24/2007 and no changes required:       PULMONARY NODULE (ICD-518.89)       C O P D (ICD-496)       TOBACCO ABUSE (ICD-305.1)       CHEST PAIN, ATYPICAL (ICD-786.59)       COPD (ICD-496)           Family History:    Reviewed history and no changes required:  Social History:    Reviewed history and no changes required:   Risk Factors: Tobacco use:  current    Year started:  trying to quit    Cigarettes:  Yes -- 1/2 pack pack(s) per day   Review of Systems      See HPI   Vital Signs:  Patient Profile:   60 Years Old Female Height:     63 inches Weight:      119.2 pounds BMI:     21.19 O2 Sat:      97 % O2 treatment:    Room Air Temp:     97.7 degrees F oral Pulse rate:   64 / minute BP sitting:   94 / 58  (left arm) Cuff size:   regular  Pt. in pain?   no  Vitals Entered By: Clarise Cruz Duncan Dull) (Oct 07, 2007 11:01 AM)                  Physical Exam  General:     thin.   Head:     normocephalic and atraumatic Eyes:     PERRLA/EOM intact; conjunctiva and sclera clear Ears:     TMs intact and clear with normal canals Nose:     no deformity, discharge, inflammation, or lesions Mouth:     no deformity or lesions Neck:     no masses, thyromegaly, or abnormal cervical nodes Chest Wall:     no deformities noted Lungs:     clear bilaterally to auscultation and percussion Heart:     regular rate and rhythm, S1, S2 without murmurs, rubs, gallops, or clicks Abdomen:     soft. normal bowel sounds. no mass Msk:     no deformity or scoliosis noted with normal posture Pulses:     pulses normal Extremities:     no clubbing, cyanosis, edema, or deformity noted Neurologic:     CN II-XII grossly intact with normal reflexes, coordination, muscle strength and tone Skin:     intact without lesions or  rashes Cervical Nodes:     no significant adenopathy Psych:     alert and cooperative; normal mood and affect; normal attention span and concentration   CT of Chest  Procedure date:  05/04/2007  Findings:      IMPRESSION: (personally reviewed)   1.  Changes of COPD.  No lung nodule, infiltrate, adenopathy, or   effusion.   2.  Small amount of coronary artery calcification.   3.  Somewhat nodular thyroid.  Consider ultrasound of the thyroid to   assess further.   4.  Non-obstructing left lower pole renal calculus.    Read By:  Juline Patch,  M.D.   Released By:  Juline Patch,  M.D.  Additional Information  HL7 RESULT STATUS : F  External image : [phone]  CT of Chest  Procedure date:  09/19/2007  Findings:      IMPRESSION: (personally revieweD)    1.  Tiny left upper lobe nodule is not definitely seen on the prior   study.  This can be followed in 12 months, as clinically indicated.   2.  Right thyroid nodule.  Ultrasound is recommended, as clinically   indicated.   3.  Slight prominence of the right adrenal gland.  Small adenoma   not excluded.    Read By:  Reyes Ivan.,  M.D.   Released By:  Reyes Ivan.,  M.D.    Impression & Recommendations:  Problem # 1:  PULMONARY NODULE (ICD-518.89) Assessment: New Pulm nodules of 02/2007 seen [facility] have resolved completely in 04/2007 Ct chest ordered by Dr. Althea Charon. However, when I saw her in 03/2007 I was unasware this cT was in progress. So, I had her do ct chest in 09/2007 which again shows complete resolution of all nodules from 10/;2008 but there is a new micronodule in Lingula region that is new sine 04/2007 and therefore highly unlikely to be cancer esp given small size. Marland Kitchen   PLAN Ct chest in may 2010 (1 year) Orders: Radiology Referral (Radiology) Est. Patient Level III (84696)   Problem # 2:  C O P D (ICD-496) Currently asymptomatic. Intolerant to spiriva. Refuses LABA. Long  disucssion on rehab and potential side benefits of exercise with quitting smoking and depression. She is willin gto try it out at  [place],  PLAN Pulmonary rehab The following medications were removed from the medication list:    Prednisone 10 Mg Tabs (Prednisone) .Marland KitchenMarland KitchenMarland KitchenMarland Kitchen 4 tabs for 2 days, then 3 tabs for 2 days, 2 tabs for 2 days, then 1 tab for 2 days, then stop  Orders: Radiology Referral (Radiology) Rehabilitation Referral (Rehab) Est. Patient Level III (69629)   Problem # 3:  TOBACCO ABUSE (ICD-305.1) Assessment: Unchanged continues to smoke. Failed chantix and zyban  PLAN Refer rehab Orders: Radiology Referral (Radiology) Rehabilitation Referral (Rehab) Est. Patient Level III ([id number])   Problem # 4:  CHEST PAIN, ATYPICAL (ICD-786.59) Assessment: Unchanged under pcp followup   Patient Instructions: 1)  Next CT chest in may 2010 for nodule followup 2)  Please try to attend pulmnary rehab at [facility] 3)  Next visit in 6-7 months   ]

## 2010-06-17 NOTE — Progress Notes (Signed)
Summary: swollen tongue/ mouth raw  Phone Note Call from Patient   Caller: Patient Call For: ramaswamy Summary of Call: re: spiriva. tongue is swollen (just a little bit per pt). roof of mouth and inside lip is raw. [facility]. [phone].  Initial call taken by: Tivis Ringer,  September 15, 2007 11:43 AM  Follow-up for Phone Call        Pt had not been rinsing mouth after using spiriva.  Instructed pt to start rinsing mouth after each use of spiriva.  Brush mouth  with baking soda.  Call back if symptoms do not improve Follow-up by: Abigail Miyamoto RN,  September 15, 2007 12:01 PM

## 2010-06-17 NOTE — Progress Notes (Signed)
Summary: returned call  Phone Note Call from Patient   Caller: Patient Call For: ramaswamy Summary of Call: pt returned call from jennifer. call cell [phone] Initial call taken by: Tivis Ringer,  November 15, 2008 12:47 PM  Follow-up for Phone Call        pt notified of CT results. Follow-up by: Carron Curie CMA,  November 15, 2008 3:07 PM

## 2010-06-17 NOTE — Progress Notes (Signed)
Summary: pick up disc  Phone Note Call from Patient   Caller: Patient Call For: ramaswamy Summary of Call: pt would like to pick up ct disk. [phone] Initial call taken by: Tivis Ringer,  December 03, 2008 2:53 PM  Follow-up for Phone Call        called and spoke with pt. pt had CT done at [facility].  Informed pt to call Rose at [facility] CT to request copy of CT.  Aundra Millet Reynolds LPN  December 03, 2008 3:51 PM

## 2010-06-17 NOTE — Assessment & Plan Note (Signed)
Summary: copd exacerbation per MR/lc   Referred by:  Dr. Marisue Brooklyn PCP:  Dr. Marisue Brooklyn  Chief Complaint:  increased SOB and dry cough - no better since last OV - 5 day round of doxy was called in yesterday.  History of Present Illness: 60 year old female former smoker with known hx of COPD presents for persistent cough, thick mucus cant get up, Seen last week in office given Doxycycline for 5 days, No better. Denies chest pain, dyspnea, orthopnea, hemoptysis, fever, n/v/d, edema. Doxycycline called in last pm, taken 2 doses, no better.         Prior Medication List:  ESTRACE 2 MG  TABS (ESTRADIOL) Take 1 tablet by mouth once a day TRAZODONE HCL 100 MG  TABS (TRAZODONE HCL) Take 1 tab by mouth at bedtime LEVOTHROID 25 MCG  TABS (LEVOTHYROXINE SODIUM) Take 1 tablet by mouth once a day CELEXA 20 MG  TABS (CITALOPRAM HYDROBROMIDE) take 60 mg once daily CENTRUM SILVER   TABS (MULTIPLE VITAMINS-MINERALS) Take 1 tablet by mouth once a day CALCIUM 500 500 MG  TABS (CALCIUM CARBONATE) Take 1 tablet by mouth two times a day VOLTAREN 75 MG  TBEC (DICLOFENAC SODIUM) two times a day SPIRIVA HANDIHALER 18 MCG  CAPS (TIOTROPIUM BROMIDE MONOHYDRATE) one puffs in handihaler daily DOXYCYCLINE HYCLATE 100 MG  TABS (DOXYCYCLINE HYCLATE) 1 by mouth two times a day   Current Allergies (reviewed today): ! * LITHIUM ! MORPHINE ! SUDAFED ! PREDNISONE  Past Medical History:    Reviewed history and no changes required:       PULMONARY NODULE (ICD-518.89)       C O P D (ICD-496)       TOBACCO ABUSE (ICD-305.1)       CHEST PAIN, ATYPICAL (ICD-786.59)       COPD (ICD-496)           Family History:    Reviewed history and no changes required:  Social History:    Reviewed history and no changes required:   Risk Factors:  Tobacco use:  current    Year started:  trying to quit    Cigarettes:  Yes -- 1/2 pack pack(s) per day   Review of Systems      See HPI   Vital Signs:  Patient  Profile:   60 Years Old Female Height:     63 inches Weight:      122.13 pounds O2 Sat:      94 % O2 treatment:    Room Air Temp:     96.6 degrees F oral Pulse rate:   76 / minute BP sitting:   98 / 42  (right arm) Cuff size:   regular  Vitals Entered By: Boone Master CNA (August 24, 2007 10:17 AM)             Is Patient Diabetic? No Comments Medications reviewed with patient ..................................................................Marland KitchenMalayla Granberry CNA  August 24, 2007 10:17 AM      Physical Exam  No acute distress with stable vital signs. HEENT:  Unremarkable.  Oropharynx is clear. LUNG FIELDS:  Coarse breath sounds with scattered rhonchi. HEART:  There is a regular rhythm without murmur, gallop, rub. ABDOMEN:  Soft, non-tender EXTREMITIES:  Warm without calf tenderness, cyanosis, clubbing, edema.        Problem # 1:  COPD (ICD-496) Slow to resolve exacerbation  Rec: Stop Doxycycline.  Begin Avelox once daily for 7 days (sample given) Mucinex DM two times a  day  Prednisone taper over next week, take with food.  Hold Voltaren until done with prednisone.  Prilosec 20mg  once daily 2 weeks.   Her updated medication list for this problem includes:    Spiriva Handihaler 18 Mcg Caps (Tiotropium bromide monohydrate) ..... One puffs in handihaler daily    Avelox 400 Mg Tabs (Moxifloxacin hcl) .Marland Kitchen... 1 by mouth once daily    Prednisone 10 Mg Tabs (Prednisone) .Marland KitchenMarland KitchenMarland KitchenMarland Kitchen 4 tabs for 2 days, then 3 tabs for 2 days, 2 tabs for 2 days, then 1 tab for 2 days, then stop   Medications Added to Medication List This Visit: 1)  Avelox 400 Mg Tabs (Moxifloxacin hcl) .Marland Kitchen.. 1 by mouth once daily 2)  Prednisone 10 Mg Tabs (Prednisone) .... 4 tabs for 2 days, then 3 tabs for 2 days, 2 tabs for 2 days, then 1 tab for 2 days, then stop   Patient Instructions: 1)  Stop Doxycycline.  2)  Begin Avelox once daily for 7 days (sample given) 3)  Mucinex DM two times a day  4)  Prednisone  taper over next week, take with food.  5)  Hold Voltaren until done with prednisone.  6)  Prilosec 20mg  once daily 2 weeks.  7)  follow up 2-3 weeks Dr. Marchelle Gearing     Prescriptions: PREDNISONE 10 MG  TABS (PREDNISONE) 4 tabs for 2 days, then 3 tabs for 2 days, 2 tabs for 2 days, then 1 tab for 2 days, then stop  #20 x 0   Entered and Authorized by:   Rubye Oaks NP   Signed by:   Tammy Parrett NP on 08/24/2007   Method used:   Print then Give to Patient   RxID:   [id number]  ]

## 2010-06-17 NOTE — Progress Notes (Signed)
Summary: set up CT  Phone Note Call from Patient   Caller: Patient Call For: ramaswamy Summary of Call: pt needs chest CT set up. (per last ov). ok to leave msg on pt's phone as she will be out for the rest of the day. Initial call taken by: Tivis Ringer,  September 13, 2008 10:49 AM  Follow-up for Phone Call        Pt has not been seen in office since 10/07/07.  Last Pt instructs states F/U LUL nodule in 12 mos with repeat CT.  Please put order in EMR for CT or do you want to see pt in office for OV first? Pt was supposed to f/u with MR in 6-7 mos per pt instruct, but failed to do so. Please advise. Thanks Follow-up by: Cloyde Reams RN,  September 13, 2008 11:06 AM  Additional Follow-up for Phone Call Additional follow up Details #1::        have ct and then see me   order in EMR for ct.   Additional Follow-up by: Kalman Shan MD,  September 14, 2008 1:32 AM

## 2010-06-17 NOTE — Progress Notes (Signed)
Summary: Records Request  Faxed OV & Stress to [facility] at [facility] ([phone]). Debby Freiberg  March 24, 2010 1:43 PM

## 2010-06-17 NOTE — Assessment & Plan Note (Signed)
Summary: BREATHING PROBLEM/ MBW   Visit Type:  Follow-up Referred by:  Dr. Marisue Brooklyn PCP:  Dr. Marisue Brooklyn  Chief Complaint:  office visit.......SOB ....Brittany Kitchenpain in back......Brittany Kitchenreviewed meds........  History of Present Illness: Followup after 02/2007 for micronodules, copd (isolated low dlco, seen on ct chest), and quitting smoking, rt atypical chest pain.  #Smoking - was started on chantix by psych but was intolerant to it. So, she discontinued. Next visit she is going to discuss zyban. Still smoking but < 1 pack per days  #Rt sided chest pain -  i had deferred to pcp. Present in rt interscapular region and right mammary pain (feels like straight to back). Been told by PCP it is costochondritis.  #micronodules - seen on CT 02/2007 mostly < 4mm. LArgest 5mm. CT done at [facility]  #COPD - past 8 days more dyspneic than usual, increasd cough compared to usual. No sputum. No fever. No hemoptysis. Of note, not taking spiriva as prescribed  - lost script  Also, feels more exhausted. Of note, was started on IV infusion for osteoporosis recently and on that day bp was low per mom. They are concerned she is "decmpensating fast". Currently bp is normal. They are wondeing if low bp is due to pulmonary issues.      Updated Prior Medication List: ESTRACE 2 MG  TABS (ESTRADIOL) Take 1 tablet by mouth once a day TRAZODONE HCL 100 MG  TABS (TRAZODONE HCL) Take 1 tab by mouth at bedtime LEVOTHROID 25 MCG  TABS (LEVOTHYROXINE SODIUM) Take 1 tablet by mouth once a day CELEXA 20 MG  TABS (CITALOPRAM HYDROBROMIDE) take 60 mg once daily CENTRUM SILVER   TABS (MULTIPLE VITAMINS-MINERALS) Take 1 tablet by mouth once a day CALCIUM 500 500 MG  TABS (CALCIUM CARBONATE) Take 1 tablet by mouth two times a day VOLTAREN 75 MG  TBEC (DICLOFENAC SODIUM) two times a day  Current Allergies (reviewed today): ! * LITHIUM ! MORPHINE ! SUDAFED ! PREDNISONE   Family History:    Reviewed history and no  changes required:  Social History:    Reviewed history and no changes required:   Risk Factors: Tobacco use:  current   Review of Systems      See HPI   Vital Signs:  Patient Profile:   60 Years Old Female Height:     63 inches Weight:      120 pounds BMI:     21.33 O2 Sat:      96 % O2 treatment:    Room Air Temp:     97.6 degrees F oral Pulse rate:   75 / minute BP sitting:   120 / 64  (left arm) Cuff size:   large  Pt. in pain?   no  Vitals Entered By: Clarise Cruz Duncan Dull) (August 16, 2007 12:02 PM)                  Physical Exam  General:     thin.   Head:     normocephalic and atraumatic Eyes:     PERRLA/EOM intact; conjunctiva and sclera clear Ears:     TMs intact and clear with normal canals Nose:     no deformity, discharge, inflammation, or lesions Mouth:     no deformity or lesions Neck:     no masses, thyromegaly, or abnormal cervical nodes Chest Wall:     no deformities noted Lungs:     clear bilaterally to auscultation and percussion Heart:  regular rate and rhythm, S1, S2 without murmurs, rubs, gallops, or clicks Abdomen:     soft. normal bowel sounds. no mass Msk:     no deformity or scoliosis noted with normal posture Pulses:     pulses normal Extremities:     no clubbing, cyanosis, edema, or deformity noted Neurologic:     CN II-XII grossly intact with normal reflexes, coordination, muscle strength and tone Skin:     intact without lesions or rashes Cervical Nodes:     no significant adenopathy Psych:     alert and cooperative; normal mood and affect; normal attention span and concentration     Problem # 1:  C O P D (ICD-496) Assessment: Deteriorated Likley in very mild exacerbation. Not taking spiriva. Still smoiking  PLAN Followup for smoking with psych Reiterated need for daily spiriva and its efficacy in iproving dyspnea, and cutting down exacerbation Doxy for 5 days UC/ER if worse Her updated medication  list for this problem includes:    Spiriva Handihaler 18 Mcg Caps (Tiotropium bromide monohydrate) ..... One puffs in handihaler daily    Doxycycline Monohydrate 100 Mg Caps (Doxycycline monohydrate) ..... By mouth twice daily  Orders: Est. Patient Level III (16109)   Problem # 2:  TOBACCO ABUSE (ICD-305.1) Assessment: Unchanged follow wiht psych Orders: Est. Patient Level III (60454)   Problem # 3:  PULMONARY NODULE (ICD-518.89) Assessment: Unchanged next ct needed july 2009 but patient says she has schedule for may 2009 and wants to keep it.  PLAN CT chest in may 2009 and followup Orders: Est. Patient Level III (09811)   Problem # 4:  CHEST PAIN, ATYPICAL (ICD-786.59) Assessment: Unchanged follow with pcp Orders: Est. Patient Level III ([id number])   Medications Added to Medication List This Visit: 1)  Voltaren 75 Mg Tbec (Diclofenac sodium) .... Two times a day 2)  Spiriva Handihaler 18 Mcg Caps (Tiotropium bromide monohydrate) .... One puffs in handihaler daily 3)  Doxycycline Monohydrate 100 Mg Caps (Doxycycline monohydrate) .... By mouth twice daily   Patient Instructions: 1)  take doxycycline and spiriva as prescribed 2)  keep up ct chest appt in may 2009 3)  folllowoup after ct chest    Prescriptions: DOXYCYCLINE MONOHYDRATE 100 MG  CAPS (DOXYCYCLINE MONOHYDRATE) By mouth twice daily  #12 x 0   Entered and Authorized by:   Kalman Shan MD   Signed by:   Kalman Shan MD on 08/16/2007   Method used:   Print then Give to Patient   RxID:   [id number] SPIRIVA HANDIHALER 18 MCG  CAPS (TIOTROPIUM BROMIDE MONOHYDRATE) one puffs in handihaler daily  #1 x 6   Entered and Authorized by:   Kalman Shan MD   Signed by:   Kalman Shan MD on 08/16/2007   Method used:   Print then Give to Patient   RxID:   [id number]  ]

## 2010-06-28 NOTE — Discharge Summary (Signed)
[facility]   ED Discharge Summary   NAME:  Cheryl Paul, Cheryl Paul   SEX:   F   DOB: [birth year]   MR#    [id number]   ROOM:     ACCT#  [id number]               DATE AND TIME OF ASSIGNMENT:   06/28/2010 at 20:00.       DATE AND TIME OF DISPOSITION:     06/29/2010 at 1230.       ASSIGNMENT DIAGNOSIS:   Chest pain.       DISPOSITION DIAGNOSIS:   Chest pain, cardiac unlikely.       HISTORY OF PRESENT ILLNESS:   A 60 year old female came in with symptoms of a tightness across her chest in    the middle of the night.  She had a normal EKG and initial set of enzymes as    well as a negative D-dimer, and she was placed in the observation for    continued cardiac rule out.       COURSE IN THE OBSERVATION UNIT:   [name] vitals remained stable.  She had several more sets of cardiac    enzymes which were negative.  Her symptoms resolved.  She had a stress echo    which was interpreted by cardiology as negative for ischemia by segmental wall    motion analysis and low probability for hemodynamically significant coronary    artery disease.       EXAMINATION AT DISPOSITION:   The patient was feeling well, asymptomatic.  It was realized that her    chemistry had never been checked, so a BMP was ordered.  Her potassium was    normal.  Her glucose is 99.  Renal function was normal.  She was slightly    hypocalcemic but asymptomatic.  Findings were discussed with her.  She felt    comfortable being discharged home.  She has a history of both diabetes and    hypertension and high cholesterol, has not seen a physician in 3 years.  I    have given her Dr. Rosalia Hammers for followup and advised that she get in for further    treatment and/or testing.         VITAL SIGNS:  Blood pressure 141/93, pulse 78, respirations 18, temperature    98.3, 99% on room air, rating her pain as a 0.    LUNGS:  Clear.   CARDIOVASCULAR:  Heart regular without murmur, gallop or thrill.   ABDOMEN:  Soft, benign       DISPOSITION:      Home as above.  The patient was personally evaluated by myself and Dr. Westley Foots who agrees with the above assessment and plan.  The patient is discharged    with verbal and written instructions and a referral for ongoing care.  The    patient is aware that they may return at any time for new or worsening    symptoms.  It was discussed with the patient that their cardiac evaluation was    unremarkable and does not indicate that immediate intervention is necessary.     The patient was counseled that the testing is not 100 percent accurate and    may generate false negatives.  Should they have continued, new, or worsening    symptoms, such as chest pain or shortness of breath, they should seek    treatment immediately.  ___________________   Stark Jock MD   Dictated ZO:XWRU [place], [place]   ec   D:06/29/2010   T: 06/30/2010 01:26:53   [id number]

## 2010-06-28 NOTE — ED Provider Notes (Signed)
KNOWN ALLERGIES   NKDA       TRIAGE (Sat Jun 28, 2010 15:12 LDD0)   PATIENT: NAME: Cheryl Paul, AGE: 60, GENDER: female, DOB: [birth year]         Feb 23, 1951, TIME OF GREET: Sat Jun 28, 2010 15:11, [place]: [id number],         MEDICAL RECORD NUMBER: [id number], ACCOUNT NUMBER: [id number], PCP:         Maye Hides,. (Sat Jun 28, 2010 15:12 LDD0)   ADMISSION: URGENCY: 2, DEPT: Emergency, BED: WAITING. (Sat Jun 28, 2010 15:12 LDD0)   COMPLAINT:  Cp/Side Pain. (Sat Jun 28, 2010 15:12 LDD0)   PRESENTING COMPLAINT:  pt presents to ER c/o substernal chest         pain radiating around to back (left &amp; right) &amp; right shoulder         intermittently since 0100 this am. (16:39 MMT2)   PAIN: Patient complains of pain, On a scale 0-10 patient rates         pain as 0, substernal chest radiating to right/left back &amp; right         shoulder, Pain is intermittent, Onset was 0100, Pain exacerbated by         ambulation. (16:39 MMT2)   LMP: LMP: Hysterectomy. (16:39 MMT2)   TREATMENT PRIOR TO ARRIVAL: None. (16:39 MMT2)   TB SCREENING: TB screen negative for this patient. (16:39         MMT2)   ABUSE SCREENING: Patient denies physical abuse or threats. (16:39         MMT2)   FALL RISK: Patient has a low risk of falling, Patient has no         history of falling (0), No secondary diagnosis (0), None/bed         rest/nurse assist (0), No IV or IV access (0), Normal/bed         rest/wheelchair (0), Oriented to own ability (0), Total 0. (16:39         MMT2)   SUICIDAL IDEATION: Suicidal ideation is not present. (16:39         MMT2)   ADVANCE DIRECTIVES: Patient does not have advance directives,         Triage assessment performed. (16:39 MMT2)   PROVIDERS: TRIAGE NURSE: Annamary Carolin, RN. (Sat Jun 28, 2010 15:12         LDD0)       CURRENT MEDICATIONS (16:40 MMT2)   Patient not taking meds       MEDICATION SERVICE (18:09 BRI1)   Aspirin:  Order: Aspirin - Dose: 325 mg : Oral         Ordered by: Kristeen Mans, PA-C          Entered by: Kristeen Mans, PA-C Sat Jun 28, 2010 16:23 ,          Acknowledged by: Marlyce Huge, RN Sat Jun 28, 2010 16:40         Documented as given by: Marlyce Huge, RN Sat Jun 28, 2010 18:09          Patient, Medication, Dose, Route and Time verified prior to         administration.          Time given: 1730, Amount given: 325mg , Site: Medication administered         P.O., Correct patient, time, route, dose and medication confirmed  prior to administration, Patient advised of actions and side-effects         prior to administration, Allergies confirmed and medications reviewed         prior to administration, Patient tolerated procedure well, Patient in         position of comfort, Side rails up, Cart in lowest position, Call         light in reach.       ORDERS   12 LEAD EKG:  Ordered for: Tsuchitani, M.D., Hunt[place] Dec         Status: Active. (15:12 LDD0)   Cardiac Monitor:  Ordered for: Cipriano Mile, MD, Onalee Hua         Status: Done by Rochele Raring RN, [facility] Jun 28, 2010 16:41. (16:22         BRI1)   MYOGLOBIN (BLOOD):  Ordered for: Cipriano Mile, MD, Onalee Hua         Status: Done by System Sat Jun 28, 2010 17:48. (16:22 BRI1)   BP Monitor:  Ordered for: Cipriano Mile, MD, Onalee Hua         Status: Done by Rochele Raring RN, [facility] Jun 28, 2010 16:41. (16:22         BRI1)   CHEST 2 VIEWS:  Ordered for: Cipriano Mile, MD, Onalee Hua         Status: Active. (16:22 BRI1)   CBC, AUTOMATED DIFFERENTIAL:  Ordered for: Cipriano Mile, MD, David         Status: Done by System Sat Jun 28, 2010 17:35. (16:22 BRI1)   Urine dip (send for lab U/A if positive):  Ordered for: Cipriano Mile,         MD, Onalee Hua         Status: Done by Francina Ames Sat Jun 28, 2010 17:05. (16:22         BRI1)   TROPONIN I:  Ordered for: Cipriano Mile, MD, David         Status: Done by System Sat Jun 28, 2010 17:47. (16:22 BRI1)   CPK PROFILE:  Ordered for: Cipriano Mile, MD, David         Status: Done by System Sat Jun 28, 2010 17:47. (16:22 BRI1)    O2 sat Monitor:  Ordered for: Cipriano Mile, MD, Onalee Hua         Status: Done by Rochele Raring RN, [facility] Jun 28, 2010 16:41. (16:22         BRI1)   IV- Saline Lock:  Ordered for: Cipriano Mile, MD, Onalee Hua         Status: Done by Cruz Condon, Jamie Sat Jun 28, 2010 17:05. (16:24         JLC7)   BP Cuff Adult Large:  Ordered for: Cipriano Mile, MD, Onalee Hua         Status: Active. (16:46 MMT2)   CONTINUOUS PULSE OX:  Ordered for: Cipriano Mile, MD, Onalee Hua         Status: Active. (16:46 MMT2)   MONITOR ELECTRODE:  Ordered for: Cipriano Mile, MD, Onalee Hua         Status: Active. (16:46 MMT2)   D-DIMER:  Ordered for: Cipriano Mile, MD, David         Status: Done by System Sat Jun 28, 2010 18:15. (17:42 BRI1)   SLIPPERS:  Ordered for: Cipriano Mile, MD, David         Status: Active. (17:48 JDP0)   ED CHEST PAIN OBSERVATION for CEP:  Ordered for: Cipriano Mile, MD,         Onalee Hua  Status: Done by Neil Crouch Sat Jun 28, 2010 18:19. (18:17         DAP0)       NURSING ASSESSMENT: CARDIOVASCULAR (16:41 MMT2)   CONSTITUTIONAL: Patient arrives ambulatory, Gait steady, Patient         appears comfortable, Patient cooperative, Patient alert, Oriented to         person, place and time, Skin warm, Skin dry, Skin normal in color,         Mucous membranes pink, Mucous membranes moist, Patient is         well-groomed.   PAIN: substernal, Pain radiates, to the left back, to the right         back, to the right shoulder, Onset of pain 0100, intermittent,         Patient rates pain as 0 out of 10, Pain exacerbated by, ambulation.   CARDIOVASCULAR: Cardiovascular assessment findings include heart         rate normal, Heart rhythm normal sinus, No associated diaphoresis,         Associated with dyspnea, with exertion, no associated dizziness, no         associated palpitations, no associated paresthesias, no associated         syncopal episode, no associated weakness, No history of pulmonary         embolism, No history of DVT or leg swelling.    RESPIRATORY/CHEST: Respiratory assessment findings include         respiratory effort easy, Respirations regular, Conversing normally,         no signs of distress, no retractions noted, no cyanosis, Breath         sounds clear, Neck and chest exam findings include trachea midline,         Chest expansion equal, Chest movement symmetrical, no jugular vein         distension, no associated cough noted, no associated fever.   SAFETY: Side rails up, Cart/Stretcher in lowest position, Call         light within reach, Hospital ID band on.       NURSING PROCEDURE: ADMISSION (19:41 EAG1)   ADMISSION: Report called to, Marina Goodell, RN, Provided opportunity to         answer questions, Report called at 06/28/2010 19:43, Patient Admited         at. 06/28/2010 19:43.   SAFETY: Call light within reach, Hospital ID band on.       NURSING PROCEDURE: CARDIAC MONITOR (16:00 MMT2)   PATIENT IDENTIFIER: Patient's identity verified by patient         stating name, Patient's identity verified by patient stating birth         date, Patient's identity verified by hospital ID bracelet.   CARDIAC MONITOR: Cardiac monitoring indicated for complaint of         chest pain, Patient placed on cardiac monitor, Heart rate: 78,         showing normal sinus rhythm, Patient placed on non-invasive blood         pressure monitor, with disposable blood pressure cuff applied.   FOLLOW-UP: After procedure, alarms set and on, After procedure,         patient tolerating monitoring.   SAFETY: Side rails up, Cart/Stretcher in lowest position, Call         light within reach, Hospital ID band on.       NURSING PROCEDURE: EKG CHART (15:37 JAL0)   PATIENT IDENTIFIER:  Patient's identity verified by patient         stating name, Patient's identity verified by patient stating birth         date, Patient's identity verified by hospital ID bracelet.   EKG: EKG indicated for complaint of chest pain.   FOLLOW-UP: After procedure, EKG for interpretation given to Dr.          Cipriano Mile, EKG was given to Dr. at 0981.   NOTES: Patient tolerated procedure well, Notes: Ekg performed at         triage.       NURSING PROCEDURE: IV (17:05 JLC7)   PATIENT IDENITIFIER: Patient's identity verified by patient         stating name, Patient's identity verified by patient stating birth         date, Patient's identity verified by hospital ID bracelet, Patient         actively involved in identification process.   IV SITE 1: IV therapy indicated for medication administration, IV         established, to the left antecubital, using a 20 gauge catheter, in         two attempts, Saline lock established, Flushed with normal saline         (mls): 10CC, Labs drawn at time of placement, labeled in the presence         of the patient and sent to lab, Labs drawn at 1705, Tourniquet         removed from patient after procedure., Labs labeled in the presence         of the patient and then sent to the Lab.   FOLLOW-UP SITE 1: After procedure, sterile transparent dressing         applied, After procedure, no drainage at IV site, After procedure, no         swelling at IV site, After procedure, no redness at IV site.   SAFETY: Side rails up, Cart/Stretcher in lowest position, Family         at bedside, Call light within reach, Hospital ID band on.       NURSING PROCEDURE: LAB DRAW (17:55 JLC7)   PATIENT IDENTIFIER: Patient's identity verified by patient         stating name, Patient's identity verified by patient stating birth         date, Patient's identity verified by hospital ID bracelet, Patient's         identity verified by family member, Patient actively involved in         identification process.   LAB DRAW: Lab draw indicated for obtaining specimens for         evaluation, Subsequent lab draw performed, from vascular access         device, existing IV site, LEFT AC, After labs drawn, device flushed         with saline, Lab specimens labeled in the presence of the patient and          sent to lab, LABS DRAWN AT 1755.   SAFETY: Side rails up, Cart/Stretcher in lowest position, Family         at bedside, Call light within reach, Hospital ID band on.       NURSING PROCEDURE: NURSE NOTES   NURSES NOTES: Notes: Reviewed med hx, treatments, results. Pt         posted to ED Obs. (18:24 JLD1)     Notes: attempted report @ 1916;  nurse will call back for report. (19:18         MMT2)       DIAGNOSIS (18:16 DAP0)   FINAL: PRIMARY: chest pain.       DISPOSITION   PATIENT:  Disposition Type: Observation, Disposition: Chest Pain         Observation, Condition: Satisfactory. (18:16 DAP0)      IV Infusion: N/A, Patient left the department. (20:02 EAG1)       INSTRUCTION Wynelle Link Jun 29, 2010 12:11 EI)   DISCHARGE:  STRESS TEST, CHEST PAIN OF UNCLEAR ETIOLOGY.   FOLLOWUPAdelfa Koh, GEN, [address], [place]         [id number], [phone].   SPECIAL:  Follow up with primary care physician to discuss your         blood pressure and diabetes/cholesterol and any medications that need         to be prescribed.         Return to the ER if condition worsens or new symptoms develop.   Key:     BRI1=Irwin, PA-C, [place]  DAP0=Pitrolo, MD, Anne Ng, LPN,     Veda Canning, PA-C, Erin  JAL0=Lettley, ACT III, [place]  JDP0=Parker,     RN, Adela Lank     JLC7=Czajka, PM, Jamie  JLD1=Duft, RN(LYNNETTE), Liborio Nixon  LDD0=David, RN,     Lyn     MMT2=Temme, RN, Elon Jester

## 2010-06-28 NOTE — Procedures (Signed)
Test Reason : Chest pain   Blood Pressure : ***/*** mmHG   Vent. Rate : 077 BPM     Atrial Rate : 077 BPM      P-R Int : 116 ms          QRS Dur : 080 ms       QT Int : 368 ms       P-R-T Axes : 052 004 -89 degrees      QTc Int : 416 ms   Normal sinus rhythm   Nonspecific ST and T wave abnormality   Abnormal ECG   When compared with ECG of 24-Dec-1995 09:57,   No significant change was found   Confirmed by Gotti, M.D., Maruthi (17) on 06/29/2010 10:28:45 AM   Referred By:             Overread By: Maruthi Gotti, M.D.

## 2010-06-28 NOTE — ED Provider Notes (Signed)
[facility]   EMERGENCY DEPARTMENT TREATMENT REPORT   NAME:  Cheryl Paul, Cheryl Paul   SEX:   F   ADMIT: 06/28/2010   DOB:   [birth year]   MR#    [id number]   ROOM:     TIME SEEN: 06 06 PM   ACCT#  [id number]       cc: Colbert Ewing MD       TIME OF EVALUATION:   1542.       PRIMARY CARE Kiaira Pointer:   Colbert Ewing, MD        CHIEF COMPLAINT:   Chest pain, thigh pain.       HISTORY OF PRESENT ILLNESS:   This is a 60 year old female with complaints of chest pain and left flank    pain.  The pain began last night, is described as a tightness in her chest.     She reports it is located across the whole anterior chest wall, but it seems    to be more severe underneath her right breast.  It does radiate into her back.     The patient states that she took Advil last night for her pain and was able    to fall sleep; however, when she woke up this morning, she experienced the    same pain.  It has been constant.  She does state that it is worse with    movement and is associated with some shortness of breath and diaphoresis.  The    patient denies any nausea or vomiting.  Additionally, the patient reports    that left flank pain began approximately the same time.  Denies any problems    with urination, but it does seem to be related to her chest pain.  The pain is    rated as 3 out of 10.       REVIEW OF SYSTEMS:   CONSTITUTIONAL:  No fever.   EYES: No visual symptoms.     ENT:  Denies sore throat or runny nose.   RESPIRATORY:  Does report some shortness of breath associated with her chest    pain. No cough.   CARDIOVASCULAR:  Positive for chest pain.  No palpitations.   GASTROINTESTINAL:  Denies any abdominal pain.  Does report left-sided flank    pain.   GENITOURINARY:  Denies any dysuria.   MUSCULOSKELETAL:  Positive for chest wall pain and left flank pain.  Denies    any other musculoskeletal complaints.   INTEGUMENTARY:  No rashes.   NEUROLOGIC:  No headache.       PAST MEDICAL HISTORY:    Diabetes, hyperlipidemia, hypertension and asthma.  The patient reports that    she does not take any medications for the above conditions.  History of bowel    obstructions status post surgery, hysterectomy, tubal ligation.       SOCIAL HISTORY:   Nonsmoker.       FAMILY HISTORY:   Reports that her mother had a "weak heart," but states that she never had open    heart surgery or stents placed in her heart.  It is unlikely to be coronary    artery disease.  There is a history of diabetes in her family.       MEDICATIONS:   None.       ALLERGIES:   NO KNOWN DRUG ALLERGIES.       PHYSICAL EXAMINATION:   VITAL SIGNS:  Blood pressure 151/87, pulse is 78, respirations 18, temperature  97.9, pain is 3 out of 10, O2 saturation 98% on room air.   GENERAL:  The patient is somewhat obese but she is well developed.  Appearance    and behavior are age and situation appropriate.  ENT:  Mouth with moist    mucous membranes.   NECK:  Supple.   RESPIRATORY:  Breath sounds are clear to auscultation bilaterally.  The    patient is not in any respiratory distress, is not tachypneic or using    accessory muscles to breathe.   CARDIOVASCULAR:  Regular rate and rhythm without any murmurs.  The patient has    2+ distal pulses that are equal.  She does not have any significant    peripheral edema.   CHEST:  Chest wall is without any masses; however, there is diffuse tenderness    to palpation.  There is no evidence of any kind of bruising at these sites.   GASTROINTESTINAL:  Normoactive bowel sounds.  Abdomen is soft and nontender.     It is difficult to appreciate any masses due to the patient's size.   SKIN:  Warm and dry.  There is no evidence of ecchymosis on the chest wall or    any other site.   GENITOURINARY:  I cannot reproduce any cva tenderness on the left or right    side.   NEUROLOGIC:  The patient exhibits good upper and lower extremity strength.       CONTINUATION BY Kristeen Mans, PA:         INITIAL ASSESSMENT AND MANAGEMENT PLAN:    This is a 60 year old female who is noncompliant with her diabetes,    hypertension and cholesterol medications who presented to the emergency room    with chest pain.  Additionally, the patient reports left-sided flank pain.  On    physical exam, the chest wall is tender to palpation; however, lungs are    clear to auscultation and heart is regular rate.  For further evaluation of    the patient's symptoms,  we will do a chest pain workup.  Because the pain is    described as radiating to the back, we will add a D-dimer to this workup.     Additionally, we will add a urine as she does report left-sided flank pain.       RESULTS OF DIAGNOSTIC STUDIES:    Urinalysis:  There was no evidence of infection.  It was nitrite and leukocyte    esterase negative.  Chest x-ray as read by radiology showed no acute    pulmonary process.  EKG showed no acute ST segment changes.  CBC was    unremarkable.  CPK was normal.  Troponin normal.  Myoglobin normal.  D-dimer    was also normal at 0.25.       EMERGENCY DEPARTMENT COURSE:    The patient remained stable while in the emergency room.  She was given a dose    of aspirin 325 mg for her chest pain.  The chest pain described as pressure    remained unchanged across the chest wall anteriorly bilaterally.  The patient    agreed to stay overnight in our observation unit for chest pain protocol.       CLINICAL IMPRESSION AND DIAGNOSIS:   Chest pain.       DISPOSITION AND PLAN:   The patient is discharged to the observation unit for chest pain protocol.     She will receive serial cardiac enzymes and  undergo EST testing tomorrow.  The    patient is agreeable to the above plan.  The patient was personally evaluated    by myself and Dr. Cipriano Mile, who agrees with the above assessment and plan.           ___________________   Elsie Saas MD   Dictated By: Kristeen Mans, PA   cd   D:06/28/2010   T: 06/28/2010 18:45:18   [id number]

## 2010-06-28 NOTE — ED Provider Notes (Signed)
KNOWN ALLERGIES   NKDA       TRIAGE   PATIENT: NAME: Cheryl Paul, AGE: 60, GENDER: female, DOB: Fri         [birth year], TIME OF GREET: Sat Jun 28, 2010 15:11, [place]: [id number],         MEDICAL RECORD NUMBER: [id number], ACCOUNT NUMBER: [id number], PCP:         Maye Hides,.   ADMISSION: URGENCY: 2, DEPT: Emergency, BED: WAITING.   COMPLAINT:  Cp/Side Pain.   PRESENTING COMPLAINT:  pt presents to ER c/o substernal chest         pain radiating around to back (left &amp; right) &amp; right shoulder         intermittently since 0100 this am.   PAIN: Patient complains of pain, On a scale 0-10 patient rates         pain as 0, substernal chest radiating to right/left back &amp; right         shoulder, Pain is intermittent, Onset was 0100, Pain exacerbated by         ambulation.   LMP: LMP: Hysterectomy.   TREATMENT PRIOR TO ARRIVAL: None.   TB SCREENING: TB screen negative for this patient.   ABUSE SCREENING: Patient denies physical abuse or threats.   FALL RISK: Patient has a low risk of falling, Patient has no         history of falling (0), No secondary diagnosis (0), None/bed         rest/nurse assist (0), No IV or IV access (0), Normal/bed         rest/wheelchair (0), Oriented to own ability (0), Total 0.   SUICIDAL IDEATION: Suicidal ideation is not present.   ADVANCE DIRECTIVES: Patient does not have advance directives,         Triage assessment performed.   PROVIDERS: TRIAGE NURSE: Annamary Carolin, RN.       CURRENT MEDICATIONS   Patient not taking meds       MEDICATION SERVICE   Aspirin:  Order: Aspirin - Dose: 325 mg : Oral         Ordered by: Kristeen Mans, PA-C         Entered by: Kristeen Mans, PA-C Sat Jun 28, 2010 16:23 ,          Acknowledged by: Marlyce Huge, RN Sat Jun 28, 2010 16:40         Documented as given by: Marlyce Huge, RN Sat Jun 28, 2010 18:09          Patient, Medication, Dose, Route and Time verified prior to         administration.           Time given: 1730, Amount given: 325mg , Site: Medication administered         P.O., Correct patient, time, route, dose and medication confirmed         prior to administration, Patient advised of actions and side-effects         prior to administration, Allergies confirmed and medications reviewed         prior to administration, Patient tolerated procedure well, Patient in         position of comfort, Side rails up, Cart in lowest position, Call         light in reach.   Key:     BRI1=Irwin, PA-C, [place]  LDD0=David, RN, Lyn  MMT2=Temme, Charity fundraiser, Elon Jester

## 2010-06-28 NOTE — Procedures (Signed)
Study ID: [id number]                                                      [facility]                                                      [address] [place], [place] [address]                            Exercise Stress Echocardiogram Report           Name: Cheryl Paul, Cheryl Paul Date: 06/29/2010 09:48 AM   MRN: [id number]              Patient Location: ERO^EO10^EO10^C   DOB: May 14, 1951          Age: 60 yrs   Height: 63 in            Weight: 270 lb                       BSA: 2.2 meters2   BP: 150/88 mmHg          HR: 69   Gender: Female           Account #: [id number]   Reason For Study: CHEST PAIN   Ordering Physician: Micki Riley   Performed By: Dwyane Luo       Interpretation Summary   The Electrocardiographic Interpretation: undetermined due to baseline ECG   changes.   The Echocardiographic Interpretation: normal wall motion.   Negative for ischemia by segmental wall motion analysis.   Low probability for hemodynamically significant coronary artery disease.       Stress Results              Protocol:  Bruce              Target HR: 138 bpm         Maximum Predicted HR: 162 bpm                          Stress Duration:   3:41 mm:ss *                      Maximum Stress HR: 162 bpm *           Stress Comments   A treadmill exercise test according to Bruce protocol was performed. Resting   ECG: Non-specific ST-T wave changes. Arrhythmia induced during stress:   occasional PAC's. ST Changes during stress: no further change from baseline   EKG changes. Test was terminated due to target heart rate was achieved.   Patient developed no symptoms. Normal blood pressure response.  I      WMSI = 1.00     % Normal = 100                                                                   REST                                                                    II      WMSI = 1.00     % Normal = 100                                                                   PEAK                                                                                                                               Segments  Size   X - Cannot   1 - Normal   2 -          3 - Akinetic4 -          1-2     small   Interpret                 Hypokinetic              Dyskinetic   3-5     moder   ate   5 -                                                             6-14    large   Aneurysmal                                                      15-16   diffu   se               Left Ventricle  The left ventricular chamber size at rest is normal. The left ventricular   chamber size at peak stress is smaller.       _____________________________________________________________________________   __           Electronically signed byDr. Regina Eck, MD   06/29/2010 10:15                           AM

## 2010-06-28 NOTE — Procedures (Signed)
Test Reason : Chest pain   Blood Pressure : ***/*** mmHG   Vent. Rate : 077 BPM     Atrial Rate : 077 BPM      P-R Int : 116 ms          QRS Dur : 080 ms       QT Int : 368 ms       P-R-T Axes : 052 004 -89 degrees      QTc Int : 416 ms   Normal sinus rhythm   Nonspecific ST and T wave abnormality   Abnormal ECG   When compared with ECG of 24-Dec-1995 09:57,   No significant change was found   Confirmed by Tory Emerald, M.D., Maruthi (17) on 06/29/2010 10:28:45 AM   Referred By:             Overread By: Donella Stade, M.D.

## 2010-06-28 NOTE — Procedures (Signed)
Study ID: [id number]                                                      [facility]                                                      [address] [place], [place] [address]                            Exercise Stress Echocardiogram Report           Name: Cheryl Paul, Cheryl Paul Date: 06/29/2010 09:48 AM   MRN: [id number]              Patient Location: ERO^EO10^EO10^C   DOB: 03-02-1951          Age: 60 yrs   Height: 63 in            Weight: 270 lb                       BSA: 2.2 meters2   BP: 150/88 mmHg          HR: 69   Gender: Female           Account #: [id number]   Reason For Study: CHEST PAIN   Ordering Physician: Micki Riley   Performed By: Dwyane Luo       Interpretation Summary   The Electrocardiographic Interpretation: undetermined due to baseline ECG   changes.   The Echocardiographic Interpretation: normal wall motion.   Negative for ischemia by segmental wall motion analysis.   Low probability for hemodynamically significant coronary artery disease.       Stress Results              Protocol:  Bruce              Target HR: 138 bpm         Maximum Predicted HR: 162 bpm                          Stress Duration:   3:41 mm:ss *                      Maximum Stress HR: 162 bpm *           Stress Comments   A treadmill exercise test according to Bruce protocol was performed. Resting   ECG: Non-specific ST-T wave changes. Arrhythmia induced during stress:   occasional PAC's. ST Changes during stress: no further change from baseline   EKG changes. Test was terminated due to target heart rate was achieved.   Patient developed no symptoms. Normal blood pressure response.  I      WMSI = 1.00     % Normal = 100                                                                   REST                                                                   II       WMSI = 1.00     % Normal = 100                                                                   PEAK                                                                                                                               Segments  Size   X - Cannot   1 - Normal   2 -          3 - Akinetic4 -          1-2     small   Interpret                 Hypokinetic              Dyskinetic   3-5     moder   ate   5 -                                                             6-14    large   Aneurysmal                                                      15-16   diffu   se               Left Ventricle  The left ventricular chamber size at rest is normal. The left ventricular   chamber size at peak stress is smaller.       _____________________________________________________________________________   __           Electronically signed byDr. Regina Eck, MD   06/29/2010 10:15                           AM

## 2010-07-30 LAB — CBC
HCT: 38.2 % (ref 36.0–46.0)
Hemoglobin: 12.2 g/dL (ref 12.0–15.0)
MCH: 30.3 pg (ref 26.0–34.0)
MCHC: 31.9 g/dL (ref 30.0–36.0)
MCV: 94.8 fL (ref 78.0–100.0)
Platelets: 223 10*3/uL (ref 150–400)
RBC: 4.03 MIL/uL (ref 3.87–5.11)
RDW: 12.8 % (ref 11.5–15.5)
WBC: 6.4 10*3/uL (ref 4.0–10.5)

## 2010-07-30 LAB — SURGICAL PCR SCREEN
MRSA, PCR: NEGATIVE
Staphylococcus aureus: NEGATIVE

## 2010-08-04 LAB — COMPREHENSIVE METABOLIC PANEL
ALT: 22 U/L (ref 0–35)
AST: 22 U/L (ref 0–37)
Albumin: 3.4 g/dL — ABNORMAL LOW (ref 3.5–5.2)
Alkaline Phosphatase: 78 U/L (ref 39–117)
BUN: 6 mg/dL (ref 6–23)
CO2: 27 mEq/L (ref 19–32)
Calcium: 8.8 mg/dL (ref 8.4–10.5)
Chloride: 105 mEq/L (ref 96–112)
Creatinine, Ser: 0.84 mg/dL (ref 0.4–1.2)
GFR calc Af Amer: >60 mL/min (ref >60)
GFR calc non Af Amer: >60 mL/min (ref >60)
Glucose, Bld: 111 mg/dL — ABNORMAL HIGH (ref 70–99)
Potassium: 4 mEq/L (ref 3.5–5.1)
Sodium: 139 mEq/L (ref 135–145)
Total Bilirubin: 0.5 mg/dL (ref 0.3–1.2)
Total Protein: 6.9 g/dL (ref 6.0–8.3)

## 2010-08-04 LAB — CALCIUM: Calcium: 8.3 mg/dL — ABNORMAL LOW (ref 8.4–10.5)

## 2010-08-20 LAB — APTT: aPTT: 23 seconds — ABNORMAL LOW (ref 24–37)

## 2010-08-20 LAB — PROTIME-INR
INR: 0.98 (ref 0.00–1.49)
Prothrombin Time: 12.9 seconds (ref 11.6–15.2)

## 2010-08-20 LAB — POCT CARDIAC MARKERS
CKMB, poc: 1.4 ng/mL (ref 1.0–8.0)
Myoglobin, poc: 400 ng/mL (ref 12–200)
Troponin i, poc: <0.05 ng/mL (ref 0.00–0.09)

## 2010-08-20 LAB — HEPATIC FUNCTION PANEL
ALT: 11 U/L (ref 0–35)
AST: 36 U/L (ref 0–37)
Albumin: 3.6 g/dL (ref 3.5–5.2)
Alkaline Phosphatase: 79 U/L (ref 39–117)
Bilirubin, Direct: 0.1 mg/dL (ref 0.0–0.3)
Indirect Bilirubin: 0.7 mg/dL (ref 0.3–0.9)
Total Bilirubin: 0.8 mg/dL (ref 0.3–1.2)
Total Protein: 7.1 g/dL (ref 6.0–8.3)

## 2010-08-20 LAB — CK TOTAL AND CKMB (NOT AT ARMC)
CK, MB: 2 ng/mL (ref 0.3–4.0)
Relative Index: 1.1 (ref 0.0–2.5)
Total CK: 186 U/L — ABNORMAL HIGH (ref 7–177)

## 2010-08-20 LAB — POCT I-STAT, CHEM 8
BUN: 8 mg/dL (ref 6–23)
Calcium, Ion: 1.06 mmol/L — ABNORMAL LOW (ref 1.12–1.32)
Chloride: 107 mEq/L (ref 96–112)
Creatinine, Ser: 0.8 mg/dL (ref 0.4–1.2)
Glucose, Bld: 133 mg/dL — ABNORMAL HIGH (ref 70–99)
HCT: 38 % (ref 36.0–46.0)
Hemoglobin: 12.9 g/dL (ref 12.0–15.0)
Potassium: 3.2 mEq/L — ABNORMAL LOW (ref 3.5–5.1)
Sodium: 138 mEq/L (ref 135–145)
TCO2: 18 mmol/L (ref 0–100)

## 2010-08-20 LAB — CBC
HCT: 35.9 % — ABNORMAL LOW (ref 36.0–46.0)
Hemoglobin: 11.9 g/dL — ABNORMAL LOW (ref 12.0–15.0)
MCHC: 33.1 g/dL (ref 30.0–36.0)
MCV: 89.2 fL (ref 78.0–100.0)
Platelets: 309 10*3/uL (ref 150–400)
RBC: 4.03 MIL/uL (ref 3.87–5.11)
RDW: 15 % (ref 11.5–15.5)
WBC: 7.4 10*3/uL (ref 4.0–10.5)

## 2010-08-20 LAB — DIFFERENTIAL
Basophils Absolute: 0 10*3/uL (ref 0.0–0.1)
Basophils Relative: 1 % (ref 0–1)
Eosinophils Absolute: 0.2 10*3/uL (ref 0.0–0.7)
Eosinophils Relative: 2 % (ref 0–5)
Lymphocytes Relative: 39 % (ref 12–46)
Lymphs Abs: 2.9 10*3/uL (ref 0.7–4.0)
Monocytes Absolute: 0.6 10*3/uL (ref 0.1–1.0)
Monocytes Relative: 8 % (ref 3–12)
Neutro Abs: 3.7 10*3/uL (ref 1.7–7.7)
Neutrophils Relative %: 50 % (ref 43–77)

## 2010-08-20 LAB — COMPREHENSIVE METABOLIC PANEL
ALT: 11 U/L (ref 0–35)
AST: 34 U/L (ref 0–37)
Albumin: 3.7 g/dL (ref 3.5–5.2)
Alkaline Phosphatase: 83 U/L (ref 39–117)
BUN: 8 mg/dL (ref 6–23)
CO2: 18 mEq/L — ABNORMAL LOW (ref 19–32)
Calcium: 8.9 mg/dL (ref 8.4–10.5)
Chloride: 105 mEq/L (ref 96–112)
Creatinine, Ser: 0.95 mg/dL (ref 0.4–1.2)
GFR calc Af Amer: >60 mL/min (ref >60)
GFR calc non Af Amer: >60 mL/min (ref >60)
Glucose, Bld: 136 mg/dL — ABNORMAL HIGH (ref 70–99)
Potassium: 3.3 mEq/L — ABNORMAL LOW (ref 3.5–5.1)
Sodium: 137 mEq/L (ref 135–145)
Total Bilirubin: 0.7 mg/dL (ref 0.3–1.2)
Total Protein: 7.4 g/dL (ref 6.0–8.3)

## 2010-08-20 LAB — TROPONIN I: Troponin I: 0.02 ng/mL (ref 0.00–0.06)

## 2010-08-20 LAB — GLUCOSE, CAPILLARY: Glucose-Capillary: 132 mg/dL — ABNORMAL HIGH (ref 70–99)

## 2010-08-20 LAB — LIPASE, BLOOD: Lipase: 27 U/L (ref 11–59)

## 2010-09-30 NOTE — Assessment & Plan Note (Signed)
[facility]                             PULMONARY OFFICE NOTE   NAME:Brittany [name], Brittany [name]                       MRN:          [id number]  DATE:03/21/2007                            DOB:          Oct 06, 1950    CHIEF COMPLAINT:  Followup for right-sided chest pain results and PFT  results.   PROBLEMS:  1. Chronic obstructive pulmonary disease (as evidenced by isolated low      DLCO and emphysema on CT chest).  2. Right-sided atypical chest pain.  Normal stress test on March 07, 2007.  3. Tobacco abuse disorder:  Wants to quit.  4. Micro-nodules on CT scan of the chest, followup pending.  5. Allergic to Prednisone with nausea and vomiting.  6. Significant psychiatric history in the past.   HISTORY OF PRESENT ILLNESS:  Ms. Brittany [name] returns for follow-up  results.  In the interim, she continues to have right-sided chest pain.  Cardiac stress test ordered on March 07, 2007, and is negative.  She  was seen subsequently by Dr. Lovenia Kim on March 16, 2007, and  prescribed meloxicam but this has not helped.  She is also taking  Vicodin since March 16, 2007, at night and this has helped for her  chest pain.  She says now that she also has pain in her shoulders with  radiation down to her fingers, and the fingers feel numb.  I suspect  this is related to some cervical disk disease.  I recommended that she  follow this up with her PCP.   She has also had PFTs and she is also here to discuss quitting smoking.   PAST MEDICAL HISTORY:  Unchanged from February 28, 2007.   PAST SURGICAL HISTORY:  Unchanged from March 17, 2007.   ALLERGIES:  I reviewed the medication allergies.  She says that she has  had a drug reaction to LITHIUM but with MORPHINE AND PREDNISONE she has  had vomiting.  Therefore she did not take it.   SOCIAL HISTORY:  I delved into her tobacco abuse.  She smokes 1-1/2  packs per day for the last 25 years.  She definitely  wants to quit but  she read a lot about Chantix and the suicidal behaviors that have been  reported.  She does not want to take it.  She is willing to try Zyban,  however.   FAMILY HISTORY/REVIEW OF SYSTEMS:  No changes.   CURRENT MEDICATIONS:  1. Currently Estrace.  2. Trazodone.  3. Levothyroxine.  4. Celexa.  5. Calcium.  6. Centrum Silver.  She stopped taking her nortriptyline.   PHYSICAL EXAMINATION:  VITAL SIGNS:  Weight 125 pounds, temperature 98.8  degrees, pulse 108, saturation 97% on room air.  GENERAL:  A pleasant female, seated in the examination room,  comfortable.  CNS:  Alert and oriented x3.  CARDIOVASCULAR:  Normal heart sounds.  Respiratory air entry equal on  both sides.  Normal breath sounds.  HEENT/NECK:  No neck nodes.  No elevated JVP.  ABDOMEN:  Soft, no mass, no organomegaly.  EXTREMITIES:  No clubbing, cyanosis or edema.  SKIN:  Intact.  MUSCULOSKELETAL:  Normal.  No obvious joint problems.   Pulmonary function tests:  These were reviewed today on March 21, 2007:  FEV-I 2.4 liters, 93%; FVC 3.3 liters, 98%; FEV-I to FVC ratio  72, against a predicted of 74; total lung capacity 4.8 liters, 90%;  adjusted DLCO 10.9 mL per mmHg per minute, 56%.  In essence, spirometry  and lung volumes are normal.  The only abnormality is an isolated low  DLCO.   ASSESSMENT/PLAN:  1. Right-sided chest pain:  Cardiac stress test is normal.  The      symptoms suggest spinal process like a degenerative disk disease.      I recommended that she follow this up with her primary care      physician.   1. Tobacco abuse:  She wants to try medications.  I informed her that      the descriptions of suicidal ideations were seen in a few patients      and were reported widely in the media.  No body knows the true      incidence, but is probably rare, and currently under FDA      investigation.  She then deliberated about taking Chantix, but      later she and her mom  collectively felt that given her recent      emergence from psychiatric decompensation, they did not want to      take this.  I also mentioned reports in [employer] that      Zyban/Wellbutrin could precipitate a shift towards mania in      patients with bipolar disorder.  Therefore, we jointly concluded      that her psychiatrist, Dr. Archer Asa, should advise on her the      right drug to help her quit smoking.   1. Chronic obstructive pulmonary disease:  She has chronic obstructive      pulmonary disease on the CT chest and PFTs today show isolated low      DLCO.  I have started her on Spiriva one puff daily.   1. Health maintenance:  She already had her flu shot.   1. Allergies:  She states allergy to PREDNISONE caused vomiting.  In      the future, if there is a chronic obstructive pulmonary disease      exacerbation, I see no reason why she cannot have prednisone.   FOLLOWUP:  In nine months, when she will have a CT scan of her chest for  micro-nodules.  At that time she will update me on her conversation with  her psychiatrist.     Kalman Shan, MD  Electronically Signed    MR/MedQ  DD: 03/21/2007  DT: 03/22/2007  Job #: [id number]   cc:   Lovenia Kim, D.O.  Archer Asa, M.D.

## 2010-09-30 NOTE — Letter (Signed)
February 28, 2007    Lovenia Kim, D.O.  [address]  [place], [place] [address]   RE:  Brittany [name], Brittany [name]  MRN:  [id number]  /  DOB:  [birth year]   Dear Dr. Elisabeth Most:   Thank you for referring Wyman Songster to pulmonary clinic for evaluation  of her pulmonary nodules.  It was indeed a pleasure to see her.  As you  know, she is a very pleasant 60 year old woman.  She is accompanied by  her mom.  History dates back to January 2008 when she was evaluated by  you for right-sided atypical chest pain that has been present since  November 2007.  She describes this chest pain as present in the right  lateral part of the sternum and radiating to the back with atypical  features.  She has been diagnosed to have costochondritis.  As part of  this evaluation, she had chest x-ray and was then followed up with a CT  scan of the chest.  Apparently, the CT scan in January showed right  upper lobe nodule.  This was followed up by a CT scan again on February 18, 2007.  According to her own history, the right upper lobe nodule was  stable, but there were 2 new nodules on the right lung, and therefore,  she has been referred here.   She herself is completely asymptomatic, other than the chest pain.  She  denies shortness of breath, hemoptysis, fever, weight loss, night  sweats, or chills.   PAST MEDICAL HISTORY:  1. Significant for depression on antidepressants.  2. Hypothyroidism on Synthroid.   PAST SURGICAL HISTORY:  1. Status post cholecystectomy October 2006.  2. Status post hysterectomy June 2002.  3. Status post tubal ligation January 1981.  4. Denies exposure to tuberculosis.  5. Status post hip surgery in 1971.  6. Status post TMJ surgery in 1992.   MEDICATION ALLERGIES:  LITHIUM, MORPHINE, SUDAFED, PREDNISONE.  Unclear  what the nature of the allergies are.  I will address this at the next  visit.   SOCIAL HISTORY:  She lives by herself.  She is disabled.  She works  in  Clinical biochemist.  No recent travel history.  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  She denied any asbestos or fume  exposures otherwise.  She smokes 1-and-a-half packs per day for the last  25 years, and continues to smoke.  She is divorced and has children.   FAMILY HISTORY:  Heart disease -  mother.  Her father had lung cancer.   REVIEW OF SYSTEMS:  As documented in the questionnaire.  Significant for  atypical chest pain, acid reflux, and depression.  Otherwise, negative  for any other symptoms.   CURRENT MEDICATIONS:  Estrace.  Trazodone.  Levothyroxine.  Celexa.  Calcium.  Centrum Silver.  Nortriptyline.   PHYSICAL EXAM:  VITAL SIGNS:  Weight 125.2, temperature 98.3, blood  pressure 100/58, pulse 76.  Saturation 96% on room air.  GENERAL:  Pleasant thin female sitting comfortably in the exam room.  CNS:  Alert and oriented x3.  Speech and ambulation normal.  CARDIOVASCULAR:  Normal heart sounds.  No murmurs.  RESPIRATORY:  Air entry equal in both sides.  No wheezes or crackles.  HEENT:  No neck nodes.  No  elevated JVP.  Supple neck.  ABDOMEN:  Soft.  No mass.  No organomegaly.  Normal bowel sounds.  EXTREMITIES:  No cyanosis, clubbing, or pedal edema.  SKIN:  Intact.  BACK:  Normal musculoskeletal.  No obvious joint problems.   LABORATORY EVALUATION:  CT scan of the chest done on February 23, 2007 and  reported by Dr. Frederica Kuster at [facility] on [facility] in  [place].  The findings are as follows.  1. 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  I confirm the      presence of this.  2. A 4 mm nodule at the posterior right apex.  I am not sure that this      is present.  Probably referring to a nodule-like lesion that is      present along the pleural surface.  3. Small hazy airspace nodules seen in  January in the right mid lung      at the junction of the upper and middle lobes.  They say this has      diminished.  I did not see this in either scan.  4. A 4 to 5 mm nodule in the upper right middle lobe lung window image      27.  I confirm the presence of this nodule.  5. A 3 mm subpleural nodule that is new in the medial left upper lobe      lung window 14.  I did not see this.  6. Two small 2 to 3 mm air space nodules within the left lower lobe      lung window, images 31 to 33.  I did not see this.  7. A 2 to 3 mm hazy airspace nodule along the superior margin of the      right major fissure, probably within the middle lobe lung window,      image 36.  I confirm the presence of this.  8. Additional small scattered hazy subpleural nodularities that are      unchanged.  I am not sure this is there.  9. Bilateral upper lobe emphysematous changes.  I confirm the presence      of this.  In addition, I think the emphysematous changes are      present even in the lower lobes.  10.CBC, white count is 6.7, hemoglobin 13.2, platelet count 227,000 on      February 22, 2007.  11.Chemistries show an albumin of 3.8, calcium 8.8, LFTs normal and      vitamin D normal on February 22, 2007.   ASSESSMENT AND PLAN:  1. Chest pain.  The chest pain sounds atypical, but given the fact      that she is a smoker, her mother had heart disease, and she is in      her 62s, I have recommended dobutamine cardiac stress test.   1. Micro-nodules.  There are multiple micro-nodules in the CT scan of      the chest.  Some of the nodules reported by the radiologist, I am      not sure of, but some I do agree with.  Never the less, the nodules      are too small to be categorized.  Therefore, the next followup scan      according to the Fleischner criteria, will be in 6 to 9 months.      She will have this at [facility].  I have specifically      instructed her  to bring her old scans with her for  comparison.   1. Chronic obstructive pulmonary disease.  There is definite presence      of chronic obstructive pulmonary disease on her CT scan of the      chest.  I will discuss this in more detail at the next visit.  I      have ordered PFTs and have ordered PFTs.   1. Health Maintenance.  She has already had her flu shot last week.   1. Smoking.  I have told her that she needs to stop smoking.  At      followup we will discuss this.   1. Allergies.  She is allergic to PREDNISONE and multiple other      medications.  I will delve into this more at the next visit.   Return to clinic in 2 to 4 weeks to discuss smoking and COPD.    Sincerely,      Kalman Shan, MD  Electronically Signed    MR/MedQ  DD: 02/28/2007  DT: 03/01/2007  Job #: [id number]

## 2010-10-03 NOTE — Op Note (Signed)
   NAME:  Brittany [name], Brittany [name]                          ACCOUNT NO.:  [id number]   MEDICAL RECORD NO.:  [id number]                   PATIENT TYPE:  OUT   LOCATION:  [facility]                                  FACILITY:  [facility]   PHYSICIAN:  Casimiro Needle L. Thad Ranger, M.D.           DATE OF BIRTH:  June 27, 1950   DATE OF PROCEDURE:  12/06/2002  DATE OF DISCHARGE:                                 OPERATIVE REPORT   PROCEDURE:  Diagnostic lumbar puncture.   INDICATIONS:  Atypical dementia.   DESCRIPTION OF PROCEDURE:  Informed consent was obtained after the procedure  risks and benefits were explained to the patient, and she agreed to proceed.  The patient was placed in the right lateral decubitus position and prepped  and draped in the usual sterile fashion. Local anesthesia was achieved with  2 cc of lidocaine. A 20-gauge spinal needle was inserted into the L3-4  interspace and advanced until clear CSF was obtained. Opening pressure was  70 mm of water. CSF flow rates were extremely slow and ceased after  collection of 3 cc of spinal fluid. No further CSF could be obtained, even  after multiple attempts at replacing the needle. The needle was then  withdrawn. Hemostasis achieved. There were no immediate complications noted.  The patient was advised to lie supine for one hour and then may be  discharged home. She is to have a RPR drawn with results called to me, and  CSF orders will be obtained further from that time. If RPR is positive, we  will check cell count, differential, glucose, and VDRL. If negative, will  send for oligoclonal and obtain other studies as above, to the extent that  they can be obtained with the small amount of CSF collected.                                               Michael L. Thad Ranger, M.D.    MLR/MEDQ  D:  12/06/2002  T:  12/06/2002  Job:  [id number]

## 2010-10-03 NOTE — Assessment & Plan Note (Signed)
[facility]                            CARDIOLOGY OFFICE NOTE   NAME:Brittany [name]                       MRN:          [id number]  DATE:06/24/2006                            DOB:          01-Jul-1950    Brittany [name] is a pleasant 60 year old patient of Dr Elisabeth Most.   Her sister accompanies her today.  Apparently, she has had some decline  in her mental status recently.  She is disabled from this.   She has been having atypical chest pain.  Her pain is clearly  musculoskeletal in nature.  She has pain to palpation over her chest.  She had an injection by Dr. Purnell Shoemaker before and has an indentation over the  left breast area.   The patient has not had any significant palpitations, PND, or orthopnea.   She denies on review of systems any other significant problems.   She has allergies to LITHIUM, MORPHINE, and SUDAFED.   She smokes one-and-a-half packs per year.  She drinks 32 ounces of  caffeinated beverages a day.   She is disabled, she is single.  Her sister was with her today.  Her  sister just got into nursing school.  Apparently, there have been some  issues with declining mental status which have been worked up by  neurology.  She has had previous hip surgery, tubal ligation,  hysterectomy, and TMJ surgery.  Family history remarkable for no  significant premature coronary disease.  Mother and father are still  alive.   Medications include:  1. Estrace 2 mg a day.  2. Neurontin 300 b.i.d.  3. Trazodone 100 a day.  4. Synthroid 25 a day.  5. Celexa 100 a day.  6. Calcium.   The Neurontin was started recently for the patient's chest pain.   Her exam is remarkable for a blood pressure 130/70, pulse is 70 and  regular.  HEENT is normal.  There are no carotid bruits.  Lungs are clear.  There is an S1, S2 with normal heart tones.  Abdomen  is benign, lower extremities intact pulses, no edema.   EKG is normal at rest.   I think the patient  should probably be worked up with a stress Myoview  only.   She does not think she can walk on a treadmill and I think adenosine  would be fine.  I advised her to talk to Dr. Elisabeth Most about being  placed on Mobic or other antiinflammatories.  It may be worthwhile in  the future to check a sed rate.   I will try to look up the results but apparently she had a chest CT at  [facility] recently which apparently was normal.  This would rule out  any other noncardiac etiologies to her chest pain.  I do not think she  needs a bone scan, which her sister asked about.  Her chest pain is  atypical, her EKG is normal, and I suspect her stress test will look  fine.  So long as this is the case then she will follow up with Dr.  Elisabeth Most for antiinflammatory  therapy.     Brittany [name]. Eden Emms, MD, [facility]  Electronically Signed    PCN/MedQ  DD: 06/24/2006  DT: 06/24/2006  Job #: [id number]

## 2010-10-03 NOTE — Op Note (Signed)
[facility]  Patient:    Brittany [name], Brittany [name]                       MRN: [id number] Proc. Date: 10/20/99 Adm. Date:  [id number] Attending:  Cordelia Pen Ii                           Operative Report  PREOPERATIVE DIAGNOSIS:  Uterine prolapse, cystocele and pelvic pain.  POSTOPERATIVE DIAGNOSIS:  Uterine prolapse, cystocele and pelvic pain.  OPERATION PERFORMED:  Laparoscopically assisted vaginal hysterectomy with bilateral salpingo-oophorectomy and anterior-posterior vaginal repair.  SURGEON:  Guy Sandifer. Arleta Creek, M.D.  ASSISTANT:  Juluis Mire, M.D.  ANESTHESIA:  General endotracheal.  ANESTHESIOLOGIST:  Lucille Passy, M.D.  ESTIMATED BLOOD LOSS:  250 cc.  INDICATIONS FOR PROCEDURE:  The patient is a 60 year old married white female G2, P2, status post tubal ligation with symptoms of pelvic prolapse.  Details are dictated in the history and physical.  Laparoscopically assisted vaginal hysterectomy with bilateral salpingo-oophorectomy and anterior-posterior vaginal repair has been discussed with the patient.  Possible risks and complications have been discussed including but not limited to infection, bowel, bladder or ureteral damage, bleeding requiring transfusion of blood products with possible transfusion reaction, HIV and hepatitis acquisition, deep vein thrombosis, pulmonary embolus and pneumonia, fistula formation, laparotomy, dyspareunia and recurrence of prolapse.  All questions were answered and consent was signed on the chart.  OPERATIVE FINDINGS:  Upper abdomen was normal.  Uterus was upper normal limits of size, smooth, normal in contour.  Tubes are status post ligation bilaterally.  Otherwise normal.  Left ovary contained a 2 cm smooth translucent follicular type cyst.  Right ovary normal.  Pelvic sidewall, anterior and posterior cul-de-sacs were normal.  DESCRIPTION OF PROCEDURE:  The patient was taken to the operating  room and placed in dorsal supine position where general anesthesia was induced via endotracheal intubation.  She was then placed in dorsal lithotomy position where she was prepped abdominally and vaginally.  Bladder straight catheterized.  Hulka tenaculum was placed in the uterus as a manipulator and she was draped in sterile fashion.  Small infraumbilical incision was made and a 12 mm disposable trocar sleeve was placed on the first attempt without difficulty.  Placement verified laparoscope and no damage to surrounding structures was noted.  Pneumoperitoneum was induced.  A superpubic and later a left lower quadrant incisions were made after careful transillumination and 5 mm nondisposable trocar sleeves were placed under direct visualization without difficulty.  The above findings were noted.  The course of the ureters was identified carefully bilaterally and seemed to be well clear of the area of surgery.  The laparoscope was then placed through the left lower quadrant incision and using the disposable staple with the white cartridge, the infundibulopelvic ligament on the right side was taken down, followed by a second bite taking down the round ligament.  A similar procedure was carried out on the left side using three bites in total.  Then changing back to the operative laparoscope, reinspection again revealed good placement of the staple cartridges and the ureters to be well clear of the area of surgery and good hemostasis.  The vesicouterine peritoneum was then taken down in the midline, hydrodissected and then taken down bilaterally.  Good hemostasis was achieved.  Instruments were removed and attention was turned to the vagina. The posterior cul-de-sac was entered sharply without difficulty.  The  cervic was circumscribed with a scalpel and mucosa was advanced sharply and bluntly. The uterosacral ligaments were then taken down bilaterally and ligated with transfixion sutures of 0  Monocryl.  All suture was 0 Monocryl unless otherwise designated.  Bladder pillars were then taken bilaterally.  The uterine vessels were then taken bilaterally. The anterior cul-de-sac was then entered without difficulty.  A bite above the level of the uterine vessels was taken bilaterally and the fundus with tubes and ovaries was then delivered posteriorly.  Proximal ligaments were then clamped and cut and the specimen was then cut free.  Pedicles were then ligated with two free ties bilaterally. Good hemostasis was noted.  Uterosacral ligaments were then plicated to the vaginal cuff bilaterally.  Uterosacral ligaments were then plicated in the midline with two interrupted sutures.  The posterior half of the cuff was then closed with figure-of-eight sutures.  Then the anterior vaginal mucosa was freed from the underlying bladder in the midline sharply to a point approximately 1 to 2 cm below the urethral meatus.  The mucosa was then dissected bilaterally sharply and bluntly.  Kelly plication sutures were then placed at the urethrovessical angle.  The base of the bladder was reduced with a pursestring and the vesicovaginal fascia was reapproximated in the midline with interrupted suture.  Excess vaginal mucosa was trimmed.  0 figure-of-eight sutures were used to close the anterior half of the vaginal cuff.  The anterior vaginal mucosa was then closed in running fashion with running locking 2-0 Vicryl suture.  Posterior repairs were then carried out by first removing a diamond-shaped wedge of tissue from the posterior peritoneal body.  The posterior mucosa was then freed from the underlying rectum in the midline sharply.  This was then carried bilaterally sharply and bluntly.  The rectovaginal fascia was then reapproximated in the midline with interrupted sutures of 0 Monocryl.  Excess mucosa was removed and the posterior vaginal mucosa was then reapproximated in running fashion with running  2-0 locking Vicryl suture.  The posterior perineal body was then dissected out and reapproximated with 0 Monocryl suture.  The 2-0 Vicryl suture from the  posterior vaginal mucosa was then carried forward and closed as the remainder of the incision in episiotomy type fashion.  A Foley catheter was then placed in the bladder and approximately 30 cc of clear urine was obtained.  The bladder was then filled retrograde with approximately 400 cc of normal saline. A Bonanno catheter was then placed superpubically with good aspiration of saline both with and without the stilet in place.  The catheter was then sutured in place with Ethilon suture.  The bladder was drained and Foley catheter was removed.  2-inch iodoform gauze was then placed in the vagina. Next, attention was returned to the abdomen.  Pneumoperitoneum was reintroduced.  Irrigation was carried out and excellent hemostasis was noted all around.  Inferior trocar sleeves were removed.  The pneumoperitoneum was reduced and no bleeding was noted from any site.  The pneumoperitoneum was completely reduced and the umbilical trocar sleeve was removed.  The umbilical incision was closed first with a 0 Vicryl suture in the deep underlying tissues.  3-0 Vicryl suture was then used to reapproximate the skin edges on the incisions in an interrupted type fashion.  0.5% plain Marcaine was then injected into the incisions.  Dressings were applied.  All counts were correct.  The patient was awakened and taken to recovery room in stable condition. DD:  10/20/99 TD:  10/22/99 Job: [id number]

## 2010-10-03 NOTE — H&P (Signed)
[facility]  Patient:    Brittany [name], Brittany [name]                         MRN: [id number] Adm. Date:  10/20/99 Attending:  Guy Sandifer. Arleta Creek, M.D.                         History and Physical  CHIEF COMPLAINT:  Cystocele, uterine prolapse and pelvic pain.  HISTORY OF PRESENT ILLNESS:  This patient is a 60 year old married white female, G 2, P 2, status post tubal ligation, who had regular menses until one year ago. She now has a menses every 3-12 weeks.  She has intermittent vasomotor symptoms. She has a distinct sensation of something falling from the vagina.  This is associated with low back pain.  She has occasional stress incontinence when bearing down very hard.  By examination she has a cystocele, uterine prolapse, and right adnexal pain.  A pelvic ultrasound on August 15, 1999, revealed a normal-sized uterus with normal ovaries.  A Pap smear in November 2000, is benign.  Options have been carefully discussed with the patient.  She desires definitive surgical therapy.  She is being admitted for an LAVH, BSO, and an AP repair.  PAST MEDICAL HISTORY:  Negative.  PAST SURGICAL HISTORY: 1. Right hip surgery.  The patient states she has no residual soreness and no    limit to her range of motion in the right hip. 2. Tubal ligation in 1981. 3. TMJ surgery in 1993.  SOCIAL HISTORY:  Tobacco, two packages a day.  Denies alcohol or drug abuse.  CURRENT MEDICATIONS: 1. Desyrel 50 mg q.d. 2. Celexa 40 mg q.d. 3. Allergy injections.  ALLERGIES:  No known drug allergies.  FAMILY HISTORY:  Mental retardation in a maternal aunt.  The patient is a twin.  Ovarian cancer in a maternal grandmother.  Coronary artery disease in the mother.  OBSTETRIC HISTORY:  Vaginal delivery x 2.  REVIEW OF SYSTEMS:  Negative except as above.  PHYSICAL EXAMINATION:  VITAL SIGNS:  Height 5 feet 6 inches, weight 127 pounds, blood pressure 102/60.  HEENT:  Without  thyromegaly.  LUNGS:  Clear to auscultation.  HEART:  A regular rate and rhythm.  BACK:  Without CVA tenderness.  BREASTS:  Without masses, retraction, or discharge.  ABDOMEN:  Soft, nontender.  No masses.  PELVIC:  Vulva, vagina, cervix without lesions.  There is first to second degree cystocele.  Uterus is very immobile with at least first-degree prolapse in the dorsal lithotomy position.  A small rectocele is noted.  Adnexa:  Mildly tender on the right without a mass.   Left adnexa nontender, without masses.  EXTREMITIES/NEUROLOGIC:  Grossly within normal limits.  ASSESSMENT: 1. Uterine prolapse. 2. Cystocele. 3. Pelvic pain.  PLAN:  Laparoscopically-assisted vaginal hysterectomy with bilateral salpingo-oophorectomy and anterior/posterior vaginal repair. DD:  10/15/99 TD:  10/15/99 Job: [id number]

## 2010-10-03 NOTE — Discharge Summary (Signed)
[facility]  Patient:    Brittany [name], Brittany [name]                       MRN: [id number] Adm. Date:  [id number] Disc. Date: [id number] Attending:  Cordelia Pen Ii                           Discharge Summary  ADMISSION DIAGNOSES:          1. Uterine prolapse.                               2. Cystocele.                               3. Pelvic pain.  DISCHARGE DIAGNOSES:          1. Uterine prolapse.                               2. Cystocele.                               3. Pelvic pain.  PROCEDURE:                    On October 20, 1999, laparoscopically-assisted vaginal hysterectomy with bilateral salpingo-oophorectomy and anterior and posterior vaginal repair.  REASON FOR ADMISSION:         This patient is a 60 year old married white female, gravida 2, para 2, status post tubal ligation with symptoms of vaginal prolapse and pelvic pain.  Details are dictated in history and physical.  HOSPITAL COURSE:              The patient is admitted to the hospital and undergoes the above procedure without complications.  On the evening of surgery, she has ood pain relief, is resting well, and is afebrile with clear urine output.  On the first postoperative day, the patient is up to the bathroom to try and void with her bladder trials.  Her suprapubic catheter reportedly catches on something which pulls and gives her a popping sensation and sudden onset of severe lower abdominal pain.  This was unrelieved with her PCA pump.  She also had nausea and vomiting  with this.  Examination revealed the abdomen to be slightly distended with voluntary guarding.  Acute abdominal series was without evidence of obstruction or bowel perforation.  White count that morning is 14.4 and repeat at 5 p.m. with he onset of this pain is 9.8.  Hemoglobin that morning is 10.0 and the repeat at 5  p.m. is 9.7.  It is felt that her pain primarily is due to bladder spasm.   The suprapubic catheter is removed intact and a Foley catheter is placed.  She is changed to a Dilaudid PCA and given Zofran p.r.n. for nausea and vomiting.  On he following day, she reports improvement in that she has decreased pain.  She did  develop a bad headache late that evening and the early a.m. of June 6, which lead to some nausea and vomiting.  This headache was relieved with Toradol.  She does report she had a history of headaches in the past.  At the time of evaluation, he  was passing flatus, but still had some headaches.  Her diet had not been advanced to that point.  She was afebrile and vital signs were stable.  Electrolytes were within normal limits with the exception of a calcium level of 8.3.  Her examination was otherwise within normal limits.  Her diet was advanced and the patient was ambulating well.  On the day of discharge, she is tolerating a regular diet, had a bowel movement, is afebrile with stable vital signs and feels much better.  CONDITION ON DISCHARGE:       Good.  DIET:                         Regular as tolerated.  ACTIVITY:                     No lifting, no vaginal entry, and no operation of  automobiles.  She is to call the office for problems including, but not limited to, temperature of 101 or greater, increasing pain, persistent nausea and vomiting, or vaginal bleeding as heavy as a period.  Foley catheter is in place and catheter  education is ordered.  DISCHARGE MEDICATIONS:        1. Tylox #30 one to two p.o. q.6h. p.r.n.                               2. ______ 1.25 mg daily.                               3. Macrobid #7 no refills one p.o. q.d. while                                  catheter is in place.  FOLLOW-UP:                    Follow-up will be in the office in approximately ne week for catheter removal and a voiding trial. DD:  10/23/99 TD:  10/24/99 Job: [id number]

## 2010-10-03 NOTE — Discharge Summary (Signed)
[facility]  Patient:    Brittany [name], Brittany [name] Visit Number: [id number] MRN: [id number]          Service Type: PSY Location: [facility] Attending Physician:  Denny Peon Dictated by:   Jeanice Lim, M.D. Admit Date:  06/20/2001 Discharge Date: 06/28/2001                             Discharge Summary  IDENTIFYING DATA:  This is a 60 year old Caucasian female, married, voluntarily admitted.  Referred to [facility] by her psychiatrist for suicidal thoughts, feeling that she may harm herself and explode.  The patient has been followed up by Dr. Senaida Ores and has a history of being diagnosed with bipolar disorder and being treated with lithium which she believes she is allergic to.  ADMISSION MEDICATIONS: 1. Allergy injection weekly. 2. Depakote. 3. Effexor. 4. BuSpar. 5. Trazodone. 6. Risperdal. 7. Estratest. 8. Estrace vaginal cream. 9. Calcium supplement daily.  ALLERGIES:   LITHIUM, PSEUDOEPHEDRINE, PREDNISONE.  ROUTINE ADMISSION LABORATORY DATA:  Essentially within normal limits including CMET and CBC.  MENTAL STATUS EXAMINATION:  Healthy, fully alert female in no acute distress, depressed, mildly anxious, calm and cooperative.  Speech: Within normal limits.  Mood: Depressed and mildly hopeless.  Thought process: Goal directed. Thought content: Positive for vague suicidal ideation without specific plan, no homicidal ideation, no psychosis.  Cognitive: Intact.  ADMITTING DIAGNOSES: Axis I:    Bipolar disorder type I, depressed phase. Axis II:   None. Axis III:  Lipid disorder. Axis IV:   Moderate problems with marital stress related to sexual side            effects of medications. Axis V:    32/68  HOSPITAL COURSE:  The patient was admitted and routine p.r.n. medications were ordered.  The patient was restarted on Depakote, trazodone, Risperdal, and medical medications were resumed.  BuSpar was restarted and Seroquel was optimized.  The  patient responded well to clinical intervention and medication changes, tolerating them well without side effects.  CONDITION AT DISCHARGE:  Markedly improved.  Mood was more euthymic.  Affect: Brighter.  Thought process: Goal directed.  Thought content: Negative for dangerous ideation or psychotic symptoms.  The patient reported motivation to be compliant with followup plan.  DISCHARGE MEDICATIONS: 1. Depakote 500 mg two q.h.s. 2. Seroquel 100 mg two q.h.s. 3. The patient was taken off of BuSpar and trazodone. 4. Continue Estrace, the allergy medicine, Estratest, and Os-Cal as previously    prescribed.  FOLLOWUP:  IOP downstairs.  DISCHARGE DIAGNOSES: Axis I:    Bipolar disorder type I, depressed phase. Axis II:   None. Axis III:  Lipid disorder. Axis IV:   Moderate problems with marital stress related to sexual side            effects of medications. Axis V:    Global assessment of functioning on discharge was 50-55. Dictated by:   Jeanice Lim, M.D. Attending Physician:  Denny Peon DD:  07/28/01 TD:  07/30/01 Job: [id number]

## 2010-10-03 NOTE — H&P (Signed)
[facility]  Patient:    Brittany [name], Brittany [name] Visit Number: [id number] MRN: [id number]          Service Type: PSY Location: 300 0300 02 Attending Physician:  Denny Peon Dictated by:   Young Berry Scott, N.P. Admit Date:  06/13/2001                     Psychiatric Admission Assessment  DATE OF ASSESSMENT:  June 14, 2001 at 9:30 a.m.  IDENTIFYING INFORMATION:  This is a 60 year old Caucasian female, who is married.  She is a voluntary admission.  HISTORY OF PRESENT ILLNESS:  This patient is referred by her psychiatrist for suicidal thoughts that she might "harm herself and explode."  The patient reports that she has been "on a downhill slide" for the past 3-4 weeks, feeling more depressed, irritable with poor concentration and increased forgetfulness at work.  She reports that this has been compounded by a decreasing taper of her Effexor for the past 2-3 weeks, which she was being taken off of by her psychiatrist because of the sexual side effects, which have caused severe stress in her marriage.  The patient reports feelings of suicidality and severe depression have become much worse over this past week and particularly over this past weekend.  The patient endorses hypersomnia, sleeping 9-10 hours a night.  She denies any homicidal ideation.  Denies any auditory or visual hallucinations.  The patient reports that she has tried Viagra for sexual side effects related to the antidepressants but with no response.  PAST PSYCHIATRIC HISTORY:  The patient is followed by Dr. Leone Haven as an outpatient.  This is her first psychiatric inpatient admission.  This is her [facility] admission.  The patient reports that she has a history of bipolar disorder diagnosed approximately two years ago.  Has been previously seen by Dr. Lourdes Sledge.  At one point, she had been treated with lithium, which she found out that she was  allergic to and was told never to take it again.  The patient reports that she was started on Risperdal some months back because of feeling forgetful and having some periods where her mind would jump around and she could not think clearly and then forget where she was and the Risperdal has cleared that up.  The patient also reports that, two years ago, when she was first being diagnosed with bipolar disorder, there was a period of eight months where she really does not remember anything, where she really lost everything.  SOCIAL HISTORY:  The patient is married with a supportive husband and grown children.  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  FAMILY HISTORY:  Unclear.  ALCOHOL/DRUG HISTORY:  The patient smokes approximately two packs per day of cigarettes.  She denies any other substance abuse.  MEDICAL HISTORY:  The patient is followed by Dr. Lianne Bushy, who is her primary care physician.  Medical problems include multiple environmental and drug allergies and patient does have some type of lipid disorder not otherwise specified.  Past medical history is remarkable for history of hysterectomy.  MEDICATIONS:  The patient takes an allergy injection weekly.  Depakote 1000 mg q.h.s., Effexor 37.5 mg q.d., BuSpar (dose unknown), trazodone 150 mg p.o. q.h.s., Risperdal 2 mg p.o. q.h.s., Estratest 1  q.d. and Estrace vaginal cream p.r.n. and calcium supplement daily.  DRUG ALLERGIES:  LITHIUM, PSEUDOEPHEDRINE, PREDNISONE and then several other food and environmental allergies.  POSITIVE PHYSICAL FINDINGS:  PE is pending.  Deferred until later today. Vital signs, on admission, are temperature 97.8, pulse 84, respirations 18, blood pressure 118/68.  LABORATORY DATA:  The patients thyroid panel is currently pending.   CMET and CBC are within normal limits.  MENTAL STATUS EXAMINATION:  This is a healthy, fully alert female, who is in no acute distress with a depressed and mildly anxious affect but she does have a calm and cooperative manner.  Speech is normal and relevant.  She is able to appreciate humor.  Mood is depressed and mildly hopeless.  Thought process is logical and goal directed.  She is positive for some vague suicidal ideation but with no specific intent or plan.  No homicidal ideation.  No psychosis. Cognitively, she is intact and oriented x 4.  The patients intelligence is average to above average.  Judgment within normal limits.  She is reliable. Impulse control within normal limits.  Insight fair.  DIAGNOSES: Axis I:    Rule out bipolar disorder, type 1, depressed. Axis II:   Deferred. 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 Axis IV:   Moderate to severe (problems with marital stress related to sexual            side effects of medications). Axis V:    Current 32; past year 23.  PLAN:  Voluntarily admit the patient to stabilize her mood with 15-minute checks in place.  The patient contracts for safety on the unit.  Our goal is to alleviate her suicidal ideation.  As far as a plan, we will plan on stopping her Effexor now secondary to her sexual side effects and will consider substituting Geodon for her Risperdal for stabilization and secondary antidepressant properties of the Geodon and see if that does not help her.  We will also consider an OB/GYN referral on discharge for further assistance with the sexual side effects.  The patient has used Viagra in the past but had no luck with this.  Meanwhile, we have restarted her other routine medications.  ESTIMATED LENGTH OF STAY:  Four to five days. Dictated by:   Young Berry Scott, N.P. Attending Physician:  Denny Peon DD:  06/14/01 TD:  06/15/01 Job: [id number]

## 2010-10-03 NOTE — Op Note (Signed)
Brittany [name], Brittany [name]                ACCOUNT NO.:  [id number]   MEDICAL RECORD NO.:  [id number]          PATIENT TYPE:  AMB   LOCATION:  DAY                          FACILITY:  [facility]   PHYSICIAN:  Gita Kudo, M.D. DATE OF BIRTH:  1950/05/30   DATE OF PROCEDURE:  03/11/2005  DATE OF DISCHARGE:                                 OPERATIVE REPORT   OPERATIVE PROCEDURE:  Laparoscopic cholecystectomy with intraoperative  cholangiogram.   SURGEON:  Gita Kudo, M.D.   ASSISTANT:  Anselm Pancoast. Zachery Dakins, M.D.   ANESTHESIA:  General.   PREOPERATIVE DIAGNOSIS:  Gallstones.   POSTOPERATIVE DIAGNOSIS:  Gallstones. Normal cholangiogram.   CLINICAL SUMMARY:  A 60 year old female with bouts of abdominal pain,  documented gallstones on ultrasound and a normal liver function panel.   OPERATIVE FINDINGS:  The gallbladder was thin-walled, had multiple stones in  it. The cystic duct and artery normal on anatomy and the cholangiogram  looked normal.   OPERATIVE PROCEDURE:  Under satisfactory general endotracheal anesthesia,  the patient was positioned, prepped and draped in the standard fashion. She  received 1.0 grams Ancef preop. A total of 30 mL of 0.5% Marcaine with  epinephrine was infiltrated at her operative sites for postop analgesia. A  midline incision made below the umbilicus and carried into the peritoneal  cavity. Controlled with a figure-of-eight #0 Vicryl suture and operating  Hassan port inserted, secured. Good CO2 pneumoperitoneum established and  camera placed. Under direct vision, two #5 ports placed laterally and a  second #10 medially. Lateral graspers gave good exposure and I took the  filmy adhesions down from the gallbladder and then dissected the cystic duct  carefully and circumferentially. The cystic duct gallbladder junction was  identified and a clip placed there. An incision made in the cystic duct and  the cholangiocatheter inserted and good films obtained.  The catheter  withdrawn and the duct controlled with multiple clips distally and divided.  The cystic artery was likewise circumferentially dissected, controlled with  multiple clips and divided. Following this, the gallbladder was removed from  below upward using coagulating current for both hemostasis and dissection.  The liver bed was dry, lavaged with saline, suctioned dry. Then the camera  moved to the upper port and through the lower port, a large grasper placed  and used to extract the gallbladder intact without spillage or complication.  Following this, the operative site was again checked, lavaged, suctioned.  Then all ports CO2 released. The midline closed with a previous figure-of-  eight and a second interrupted #0 Vicryl suture. Subcu  approximated with 4-0 Vicryl and Steri-Strips for the skin. Sterile  absorbent dressings were applied and the patient went to the recovery room  from the operating room in good condition. Blood loss was minimal, no  complications, sponge and needle counts correct.           ______________________________  Gita Kudo, M.D.     MRL/MEDQ  D:  03/11/2005  T:  03/11/2005  Job:  [id number]   cc:   Lianne Bushy, M.D.  Fax: [phone]

## 2010-12-23 ENCOUNTER — Other Ambulatory Visit: Payer: Self-pay | Admitting: Gastroenterology

## 2010-12-23 DIAGNOSIS — R634 Abnormal weight loss: Secondary | ICD-10-CM

## 2010-12-29 ENCOUNTER — Ambulatory Visit
Admission: RE | Admit: 2010-12-29 | Discharge: 2010-12-29 | Disposition: A | Discharge status: Home / Self Care | Payer: Medicare Other | Source: Ambulatory Visit | Attending: Gastroenterology | Admitting: Gastroenterology

## 2010-12-29 DIAGNOSIS — R634 Abnormal weight loss: Secondary | ICD-10-CM

## 2010-12-29 MED ORDER — IOHEXOL 300 MG/ML  SOLN
100.0000 mL | Freq: Once | INTRAMUSCULAR | Status: AC | PRN
  Administered 2010-12-29: 100 mL via INTRAVENOUS

## 2011-01-08 ENCOUNTER — Ambulatory Visit
Admission: RE | Admit: 2011-01-08 | Discharge: 2011-01-08 | Disposition: A | Discharge status: Home / Self Care | Payer: Medicare Other | Source: Ambulatory Visit | Attending: Gastroenterology | Admitting: Gastroenterology

## 2011-01-08 DIAGNOSIS — R634 Abnormal weight loss: Secondary | ICD-10-CM

## 2011-02-13 ENCOUNTER — Other Ambulatory Visit: Payer: Self-pay | Admitting: Gastroenterology

## 2011-03-07 NOTE — ED Provider Notes (Signed)
[facility]   EMERGENCY DEPARTMENT TREATMENT REPORT   NAME:  Cheryl Paul, Cheryl Paul   SEX:   F   ADMIT: 03/07/2011   DOB:   03/31/1951   MR#    [id number]   ROOM:     TIME SEEN: 05 57 PM   ACCT#  [id number]               PRIMARY CARE PHYSICIAN:   The patient denies.       EVALUATION TIME:   1741         CHIEF COMPLAINT:   Upper extremity pain and paresthesias.       HISTORY OF PRESENT ILLNESS:   A 60 year old female presenting with complaints of 2-week history of upper    extremity pain described as beginning near the shoulders, at times radiating    down both arms exacerbated with movement and overuse, but not exertion.  She    does complain of intermittent paresthesias to both arms as well.  She has had    no injury, no trauma, no dyspnea at rest and/or exertion.  No chest pain or    discomfort.  Seeking further evaluation in the ER at this time.       REVIEW OF SYSTEMS:   CONSTITUTIONAL:  No fever, no chill.   RESPIRATORY:  The patient does state she has some chronic dyspnea secondary to    asthma but no worsening dyspnea complaints today.  Currently, no wheezing    complaints.   CARDIOVASCULAR:  As above.  No palpitation.   GASTROINTESTINAL:  No vomiting, diarrhea, or abdominal pain.    MUSCULOSKELETAL:  As above.  No other joint pain or swelling.   INTEGUMENTARY:  No rashes.   NEUROLOGICAL:  As above.  No other sensory or motor symptoms.       PAST MEDICAL HISTORY:   Asthma, diabetes, bowel obstruction, hyperlipidemia, hypertension, arthritis,    bursitis shoulders, surgical hernia repair, hysterectomy, tonsillectomy.       SOCIAL HISTORY:   No alcohol, tobacco or drug abuse.       FAMILY HISTORY:   Noncontributory.       ALLERGIES:   IV DYE, LATEX, TYLENOL WITH CODEINE.       MEDICATIONS:   None.       PHYSICAL EXAMINATION:   GENERAL APPEARANCE:  Adequately nourished 60 year old female presenting for    exam.   VITAL SIGNS:  Blood pressure 168/82, pulse 105, rechecked on monitor 90 at     bedside, respirations 20, temperature 99.1, O2 sat 99% room air.   RESPIRATORY:  Clear and equal breath sounds.  No respiratory distress,    tachypnea, or accessory muscle use.      CARDIOVASCULAR:  Heart:  S1 and S2 appreciated, regular rate and rhythm    appreciated.     MUSCULOSKELETAL:  Some reproducible discomfort over the bilateral deltoids and    bilateral trapezius muscles but with no obvious bony deformity.  The patient    has adequate range of motion of both shoulders to flexion, extension,    abduction, adduction on exam.  She has full range of motion of other joints of    upper extremities.  No other acute musculoskeletal findings.  2+ radial    pulses bilaterally.   NEUROLOGIC:  5/5 strength upper extremities bilaterally, diminished though    symmetric deep tendon reflexes upper extremities bilaterally.  Light touch    sensation intact and equal in dermatomes of upper extremities bilaterally.  SKIN:  Warm and dry, no acute findings.          CONTINUATION BY NICHOLAS BROSKY, PA-C:       INITIAL ASSESSMENT AND MANAGEMENT PLAN:   A 60 year old female presenting with complaints of bilateral shoulder pain    exacerbated with overuse and movement.  She has had no exertional dyspnea, no    chest pain, no clinical correlation whatsoever to suggest anginal equivalent.     Emergency room attending, however, does request EKG for any possible    baseline changes.  She is a diabetic as well.  We will check baseline glucose,    although she has been noncompliant with her medication therapy.  Further    evaluate patient upon receipt of these services.       DIAGNOSTIC STUDIES:   A 12-lead EKG as read by ER attending demonstrating normal sinus rate and    rhythm with no acute ST or T-wave abnormalities.  Blood glucose 232.       RE-EVALUATION AND COURSE IN THE EMERGENCY DEPARTMENT:   The patient remained stable.  We will discharge home.  Restart her for     diabetic medications.  Recommend followup care.  It should be noted that    nursing staff did make a note incorrectly about patient having dyspnea on    exertion.  The patient denied any dyspnea on exertion to ER attending and PA.     She states she has some baseline dyspnea associated with her asthma, but no    acute exacerbations whatsoever today.       CLINICAL IMPRESSION AND DIAGNOSES:   1.  Diabetes mellitus type 2, uncontrolled, no complications.   2.  Bilateral upper extremity paresthesia.       DISPOSITION AND PLAN:   Metformin as directed to control untreated diabetes.  Must follow up with    physician within the next week for recheck of blood sugar and possible    addition of medication if necessary.  Also follow up for further evaluation of    paresthesia.  Return to the ER sooner if the condition worsens, new symptoms    develop, or for any other concerns.       The patient was personally evaluated by myself and Dr. Orma Flaming who    agrees with the above assessment and plan.           ___________________   Posey Pronto MD   Dictated By: Baruch Goldmann, PA-C       My signature above authenticates this document and my orders, the final    diagnosis (es), discharge prescription (s), and instructions in the [facility]    Pulsecheck record.   [facility]   D:03/07/2011   T: 03/08/2011 16:19:06   [id number]

## 2011-03-07 NOTE — Procedures (Signed)
Test Reason : Other   Blood Pressure : ***/*** mmHG   Vent. Rate : 088 BPM     Atrial Rate : 088 BPM      P-R Int : 132 ms          QRS Dur : 080 ms       QT Int : 314 ms       P-R-T Axes : 029 002 -07 degrees      QTc Int : 379 ms   Normal sinus rhythm   Nonspecific ST and T wave abnormality   When compared with ECG of 28-Jun-2010 15:14,   No significant change was found   Confirmed by Sposato, Joseph ([id number]) on 03/08/2011 8:21:54 AM   Referred By:             Overread By: Joseph Sposato

## 2011-03-07 NOTE — ED Provider Notes (Signed)
KNOWN ALLERGIES   Iodinated radiocontrast agents   Latex   NKDA (Unconfirmed)   Tylenol with Codeine #3       TRIAGE (17:33 LDD0)   TRIAGE NOTES:  delayed triage, patient went to restroom. (17:33         LDD0)   PATIENT: NAME: Cheryl Paul, AGE: 60, GENDER: female, DOB: Fri         28-Jun-1950, TIME OF GREET: Sat Mar 07, 2011 17:26, SSN: [id number],         MEDICAL RECORD NUMBER: [phone], ACCOUNT NUMBER: [id number], PCP: Pt         Denies,. (17:33 LDD0)   ADMISSION: URGENCY: 3, TRANSPORT: Ambulatory, DEPT: Emergency,         BED: WAITING. (17:33 LDD0)   COMPLAINT:  Pain In Both Arms Numbness In. (17:33 LDD0)   PRESENTING COMPLAINT:  Pt c/o shoulder pain and numbness/tingling         in both arms x 2 -2.5 weeks - No chest pain or SOB that is different         from baseline. (17:54 BMS1)   PAIN: Patient complains of pain, Pain described as aching, Pain         described as unbearable, On a scale 0-10 patient rates pain as 7,         Pain is constant. (17:54 BMS1)   IMMUNIZATIONS:  Last tetanus shot received less than 5 years ago.         (17:54 BMS1)   LMP: LMP: Hysterectomy. (17:54 BMS1)   TB SCREENING: TB screen negative for this patient. (17:54         BMS1)   ABUSE SCREENING: Patient denies physical abuse or threats. (17:54         BMS1)   FALL RISK: Patient has a low risk of falling, Patient has no         history of falling (0), Secondary diagnosis (25), None/bed rest/nurse         assist (0), No IV or IV access (0), Normal/bed rest/wheelchair (0),         Oriented to own ability (0), Total 25. (17:54 BMS1)   SUICIDAL IDEATION: Suicidal ideation is not present. (17:54         BMS1)   ADVANCE DIRECTIVES: Patient does not have advance directives.         (17:54 BMS1)   PROVIDERS: TRIAGE NURSE: Annamary Carolin, RN. (17:33 LDD0)   PREVIOUS VISIT ALLERGIES: Nkda. (17:33 LDD0)       PRESENTING PROBLEM (Sat Mar 07, 2011 17:33 LDD0)      Presenting problems: Arm Injury-Pain-Swelling.       CURRENT MEDICATIONS (17:39 LDD0)    Patient not taking meds       ORDERS   12 LEAD EKG:  Ordered for: Truddie Crumble, MD, Rob         Status: Active         Comment: BIL shoulder pain. (18:05 NJB)   Blood Glucose:  Ordered for: Truddie Crumble, MD, Rob         Status: Done by Louie Bun, Erlinda Sat Mar 07, 2011 18:35.         (18:05 NJB)   BP Cuff Adult X-Long:  Ordered for: Truddie Crumble, MD, Rob         Status: Active. (19:06 BMS1)   MONITOR ELECTRODE:  Ordered for: Truddie Crumble, MD, Rob         Status: Active. (19:06 BMS1)  CONTINUOUS PULSE OX:  Ordered for: Truddie Crumble, MD, Rob         Status: Active. (19:06 BMS1)       NURSING ASSESSMENT: CV WITH PROCEDURES (17:55 BMS1)   CONSTITUTIONAL: History obtained from patient, Patient arrives,         via hospital wheelchair, Gait steady, Patient appears comfortable,         Patient cooperative, Patient alert, Oriented to person, place and         time, Skin warm, Skin dry, Skin normal in color, Mucous membranes         pink, Mucous membranes moist, Patient is well-groomed, Patient         complains of Pt c/o shoulder pain and numbness/tingling in both arms         x 2 -2.5 weeks - No chest pain or SOB that is different from         baseline.   PAIN:  shoulders and arms, on a scale 0-10 patient rates pain as         7.   CARDIOVASCULAR: Cardiovascular assessment findings include heart         rate normal, Heart rhythm normal sinus, Heart sounds normal, Left         radial pulse +3(easily palpated, considered normal), Right radial         pulse +3(easily palpated, considered normal), Left dorsalis pedis         pulse +3(easily palpated, considered normal), Right dorsalis pedis         pulse +3(easily palpated, considered normal), No associated         diaphoresis, Associated with dyspnea, with exertion, normal level for         pt, no associated dizziness, no associated edema, no associated         palpitations, Associated with paresthesias, described as tingling, to          bilateral upper extremities, no associated syncopal episode, no         associated weakness, No history of pulmonary embolism, No history of         DVT or leg swelling.   RESPIRATORY/CHEST: Respiratory assessment findings include         respiratory effort easy, Respirations regular, Conversing normally,         no signs of distress, no retractions noted, no cyanosis, Breath         sounds clear, Neck and chest exam findings include trachea midline,         Chest expansion equal, Chest movement symmetrical, no jugular vein         distension, Tenderness, to left chest, no crepitus noted, no         subcutaneous emphysema noted, Associated with cough, productive of,         white sputum, no associated fever.   CARDIAC MONITOR: Patient placed on cardiac monitor, Heart rate:         90, showing normal sinus rhythm, without ectopy, with no ST segment         changes, Patient placed on non-invasive blood pressure monitor, with         disposable blood pressure cuff applied, Patient placed on continuous         pulse oximetry, Adult/pediatric oxisensor applied, Oxygen saturation         95%.   VITAL SIGNS: BP: 172, / 97, BP: (Lying), Pulse: 89, Resp: 23,  Pain: 7, O2 sat: 95, on Room air.       NURSING ASSESSMENT: EXTREMITY UPPER (17:59 BMS1)   CONSTITUTIONAL: Complex assessment performed.   PAIN: sharp, to the shoulder, bilaterally.   LEFT UPPER EXTREMITY: Left upper extremity assessment findings         include capillary refill less than 2 seconds, Skin color normal to         hand, Skin temperature to hand warm, Distal sensation intact, Muscle         tone normal, muscle strength 5, no edema present, radial pulse is +3,         brachial pulse is +3, Inspection findings include no signs of trauma.   RIGHT UPPER EXTREMITY: Right upper extremity assessment findings         include capillary refill less than 2 seconds, Skin color normal to         hand, Skin temperature to hand warm, Distal sensation intact,  Muscle         tone normal, muscle strength 5, no edema present, radial pulse is +3,         brachial pulse is +3, Inspection findings include no signs of trauma.       NURSING PROCEDURE: DISCHARGE NOTE (19:03 BMS1)   DISCHARGE: Patient discharged to home, ambulating without         assistance, driving self, unaccompanied, Discharge instructions given         to patient, Simple or moderate discharge teaching performed, by B.         Shortt RN, Prescriptions given and instructions on side effects         given, Name of prescription(s) given: Metformin, Above person(s)         verbalized understanding of discharge instructions and follow-up         care, Patient treated and evaluated by physician.   BELONGINGS: Belongings and valuables with patient at time of         discharge include:, Belongings remain with patient, Valuables remain         with patient.   SAFETY: Side rails up, Cart/Stretcher in lowest position, Family         at bedside, Call light within reach, Hospital ID band on.       NURSING PROCEDURE: EKG CHART (18:21 BMS1)   PATIENT IDENTIFIER: Patient's identity verified by patient         stating name, Patient's identity verified by patient stating birth         date, Patient's identity verified by hospital ID bracelet, Patient         actively involved in identification process.   EKG: 12 lead EKG performed on the left chest, done by B. Shortt         RN at [employer], first EKG.   FOLLOW-UP: After procedure, EKG for interpretation given to Dr.         Jama Flavors and Truddie Crumble, EKG was given to Dr. at [phone].       DIAGNOSIS (18:41 NJB)   FINAL: PRIMARY: Type II Diabetes mellitus (NIDDM) -         uncontrolled, no complications, ADDITIONAL: BIL UE paresthesias.       DISPOSITION   PATIENT:  Disposition Type: Discharged, Disposition: Discharged,         Condition: Stable. (18:41 NJB)      Patient left the department. (19:07 BMS1)       INSTRUCTION (18:39 NJB)  DISCHARGE:  DIABETES MELLITUS, TYPE II - WITH ONGOING TREATMENT         (NIDDM, DM), PARESTHESIAS (TINGLING FEELING, PINS AND NEEDLES).   FOLLOWUPErnst Spell, GMD, [address] [place] [id number], [place]         [id number], [id number].   SPECIAL:  Metformin as directed to control your untreated         diabetes. You MUST follow up with physician within next week for         recheck of blood sugar and possible additional medication if         necessary. Also follow up for further evaluation of your         paresthesias.         Read and understand discharge instructions prior to leaving ER.         Follow these in regards to care and return for those reasons as         detailed.         Return to the ER if condition worsens or new symptoms develop.   Key:     BMS1=Shortt, RN, MSN, Britta Mccreedy  LDD0=David, RN, Lyn  NJB=Brosky, PA-C,     Weston Brass

## 2011-03-07 NOTE — Procedures (Signed)
Test Reason : Other   Blood Pressure : ***/*** mmHG   Vent. Rate : 088 BPM     Atrial Rate : 088 BPM      P-R Int : 132 ms          QRS Dur : 080 ms       QT Int : 314 ms       P-R-T Axes : 029 002 -07 degrees      QTc Int : 379 ms   Normal sinus rhythm   Nonspecific ST and T wave abnormality   When compared with ECG of 28-Jun-2010 15:14,   No significant change was found   Confirmed by Lauree Chandler ([id number]) on 03/08/2011 8:21:54 AM   Referred By:             Gay Filler By: Lauree Chandler

## 2011-10-27 ENCOUNTER — Ambulatory Visit
Admission: RE | Admit: 2011-10-27 | Discharge: 2011-10-27 | Disposition: A | Discharge status: Home / Self Care | Payer: Medicare Other | Source: Ambulatory Visit | Attending: Family Medicine | Admitting: Family Medicine

## 2011-10-27 ENCOUNTER — Other Ambulatory Visit: Payer: Self-pay | Admitting: Family Medicine

## 2011-10-27 DIAGNOSIS — R0989 Other specified symptoms and signs involving the circulatory and respiratory systems: Secondary | ICD-10-CM

## 2011-11-03 ENCOUNTER — Other Ambulatory Visit: Payer: Self-pay | Admitting: Family Medicine

## 2011-11-03 DIAGNOSIS — R9389 Abnormal findings on diagnostic imaging of other specified body structures: Secondary | ICD-10-CM

## 2011-11-09 ENCOUNTER — Other Ambulatory Visit: Payer: Medicare Other

## 2011-11-09 ENCOUNTER — Ambulatory Visit
Admission: RE | Admit: 2011-11-09 | Discharge: 2011-11-09 | Disposition: A | Discharge status: Home / Self Care | Payer: Medicare Other | Source: Ambulatory Visit | Attending: Family Medicine | Admitting: Family Medicine

## 2011-11-09 DIAGNOSIS — R9389 Abnormal findings on diagnostic imaging of other specified body structures: Secondary | ICD-10-CM

## 2011-12-13 ENCOUNTER — Emergency Department (HOSPITAL_COMMUNITY): Payer: No Typology Code available for payment source

## 2011-12-13 ENCOUNTER — Emergency Department (HOSPITAL_COMMUNITY)
Admission: EM | Admit: 2011-12-13 | Discharge: 2011-12-13 | Disposition: A | Discharge status: Home / Self Care | Payer: No Typology Code available for payment source | Attending: Emergency Medicine | Admitting: Emergency Medicine

## 2011-12-13 ENCOUNTER — Encounter (HOSPITAL_COMMUNITY): Payer: Self-pay | Admitting: *Deleted

## 2011-12-13 DIAGNOSIS — F329 Major depressive disorder, single episode, unspecified: Secondary | ICD-10-CM | POA: Insufficient documentation

## 2011-12-13 DIAGNOSIS — M25559 Pain in unspecified hip: Secondary | ICD-10-CM | POA: Insufficient documentation

## 2011-12-13 DIAGNOSIS — M25539 Pain in unspecified wrist: Secondary | ICD-10-CM | POA: Insufficient documentation

## 2011-12-13 DIAGNOSIS — F3289 Other specified depressive episodes: Secondary | ICD-10-CM | POA: Insufficient documentation

## 2011-12-13 DIAGNOSIS — T148XXA Other injury of unspecified body region, initial encounter: Secondary | ICD-10-CM

## 2011-12-13 DIAGNOSIS — Z23 Encounter for immunization: Secondary | ICD-10-CM | POA: Insufficient documentation

## 2011-12-13 DIAGNOSIS — E7800 Pure hypercholesterolemia, unspecified: Secondary | ICD-10-CM | POA: Insufficient documentation

## 2011-12-13 HISTORY — DX: Depression, unspecified: F32.A

## 2011-12-13 HISTORY — DX: Pure hypercholesterolemia, unspecified: E78.00

## 2011-12-13 HISTORY — DX: Major depressive disorder, single episode, unspecified: F32.9

## 2011-12-13 LAB — POCT I-STAT, CHEM 8
BUN: 6 mg/dL (ref 6–23)
Calcium, Ion: 1.09 mmol/L — ABNORMAL LOW (ref 1.13–1.30)
Chloride: 108 mEq/L (ref 96–112)
Creatinine, Ser: 0.9 mg/dL (ref 0.50–1.10)
Glucose, Bld: 100 mg/dL — ABNORMAL HIGH (ref 70–99)
HCT: 36 % (ref 36.0–46.0)
Hemoglobin: 12.2 g/dL (ref 12.0–15.0)
Potassium: 3.4 mEq/L — ABNORMAL LOW (ref 3.5–5.1)
Sodium: 142 mEq/L (ref 135–145)
TCO2: 18 mmol/L (ref 0–100)

## 2011-12-13 LAB — GLUCOSE, CAPILLARY: Glucose-Capillary: 109 mg/dL — ABNORMAL HIGH (ref 70–99)

## 2011-12-13 MED ORDER — AMMONIA AROMATIC IN INHA
RESPIRATORY_TRACT | Status: AC
  Administered 2011-12-13: 17:00:00
  Filled 2011-12-13: qty 10

## 2011-12-13 MED ORDER — TETANUS-DIPHTH-ACELL PERTUSSIS 5-2.5-18.5 LF-MCG/0.5 IM SUSP
0.5000 mL | Freq: Once | INTRAMUSCULAR | Status: AC
Start: 2011-12-13 — End: 2011-12-13
  Administered 2011-12-13: 0.5 mL via INTRAMUSCULAR
  Filled 2011-12-13: qty 0.5

## 2011-12-13 MED ORDER — TETANUS-DIPHTHERIA TOXOIDS TD 5-2 LFU IM INJ: 0.5000 mL | INJECTION | Freq: Once | INTRAMUSCULAR | Status: DC

## 2011-12-13 NOTE — ED Notes (Signed)
Patient laying in bed unresponsive to auditory and physical stimuli. Patient responded to  ammonia  and oral suction. Patient now alert and answers questions appropriately.
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
## 2012-03-18 HISTORY — PX: ROTATOR CUFF REPAIR: SHX139

## 2012-04-14 NOTE — ED Provider Notes (Signed)
[facility]  EMERGENCY DEPARTMENT TREATMENT REPORT  NAME:  Yetta Barre, Cire  SEX:   F  ADMIT: 04/14/2012  DOB:   03-02-1951  MR#    [id number]  ROOM:    TIME SEEN: 02 16 AM  ACCT#  [id number]        TIME OF SERVICE:  0147    CHIEF COMPLAINT:  Finger injury.    HISTORY OF PRESENT ILLNESS:  The patient is a 61 year old female who accidentally cut her left second   finger with a steak knife approximately 45 minutes ago while she was cooking.    The patient denies any numbness, tingling or pain to the area.  She states   her tetanus is up to date.    REVIEW OF SYSTEMS:  CONSTITUTIONAL:  No reported fever.  MUSCULOSKELETAL:  No joint pain.  INTEGUMENTARY:  Laceration to left 2nd finger.  NEUROLOGIC:  No sensory or motor symptoms.    PAST MEDICAL HISTORY:  Diabetes, asthma, bowel obstruction, hyperlipidemia, hypertension.    CURRENT MEDICATIONS:  Reviewed on Ibex.    ALLERGIES:  MULTIPLE, PLEASE SEE IBEX.    PHYSICAL EXAMINATION:  VITAL SIGNS:  Blood pressure 147/87, pulse 83, respirations 18, temperature   97.8 orally, pain 4 out of 10, O2 saturation 97% on room air.  GENERAL APPEARANCE:  The patient appears well developed, well nourished.  She   is alert, sitting comfortably on exam bed.  MUSCULOSKELETAL:  No bony tenderness to palpation over the left hand, all 5   fingers are intact.  Radial pulses 2+.  Nail beds pink with prompt capillary   refill.  SKIN:  There is a small 1 cm superficial linear laceration over the lateral   aspect of the left 2nd finger.  No nail bed involvement is noted.  Minimal   bleeding at this time.  No surrounding erythema or edema.  NEUROLOGIC:  Strength and light touch sensation over all 5 digits and grip   strength is intact.    INITIAL IMPRESSION:  This is a 61 year old female here for evaluation of a 1 cm superficial linear   laceration to left 2nd finger requiring simple repair.  Her tetanus, again, is   up to date and she is neurovascularly intact.    PROCEDURE NOTE:   Using sterile technique, area of wound was cleaned using Betadine and normal   saline, was anesthetized using a single digit block using 2% plain lidocaine.    Using 4-0 nylon sutures, a total of 2 sutures were placed for closure of the   wound without complication.  The patient tolerated this procedure well.    Bacitracin ointment and a bandage were placed over top of wound.    FINAL DIAGNOSIS:  A 2 cm finger laceration with simple repair.    DISPOSITION AND PLAN:  The patient is stable for discharge home at this time.  The patient to follow   up with her primary care physician or return to the ED in 10 days for suture   removal.  She is to apply bacitracin, Neosporin to the area twice daily.  She   is to return for any new or worsening symptoms including increased redness   from the wound site, fever or any other complications.      The patient was personally evaluated by myself and Dr. Candis Shine who   agrees with the above assessment and plan.      ___________________  Wynelle Bourgeois MD  Dictated By: Morrie Sheldon  Dellis Anes, PA-C    My signature above authenticates this document and my orders, the final   diagnosis (es), discharge prescription (s), and instructions in the [facility]   Pul[facility]ck record.  DS  D:04/14/2012  T: 04/14/2012 03:41:09  [id number]  Authenticated by Wynelle Bourgeois, MD On 04/16/2012 82:95:62 AM

## 2012-05-25 LAB — CBC WITH AUTOMATED DIFF
BASOPHILS: 0 % (ref 0–3)
EOSINOPHILS: 2 % (ref 0–5)
HCT: 42.1 % (ref 37.0–50.0)
HGB: 13.4 gm/dl (ref 12.4–17.2)
LYMPHOCYTES: 25 % — ABNORMAL LOW (ref 28–48)
MCH: 27.1 pg (ref 25.4–34.6)
MCHC: 31.9 gm/dl (ref 30.0–36.0)
MCV: 85.1 fL (ref 80.0–98.0)
MONOCYTES: 6 % (ref 1–13)
MPV: 11.1 fL — ABNORMAL HIGH (ref 6.0–10.0)
NEUTROPHILS: 67 % — ABNORMAL HIGH (ref 34–64)
NRBC: 0 (ref 0–0)
PLATELET: 167 10*3/uL (ref 140–450)
RBC: 4.95 M/uL (ref 3.60–5.20)
RDW: 14.7 % — ABNORMAL HIGH (ref 11.5–14.0)
WBC: 10.6 10*3/uL (ref 4.0–11.0)

## 2012-05-25 LAB — METABOLIC PANEL, BASIC
BUN: 15 mg/dl (ref 7–25)
CO2: 30 mEq/L (ref 21–32)
Calcium: 9 mg/dl (ref 8.5–10.1)
Chloride: 106 mEq/L (ref 98–107)
Creatinine: 0.7 mg/dl (ref 0.6–1.3)
GFR est AA: >60
GFR est non-AA: >60
Glucose: 114 mg/dl — ABNORMAL HIGH (ref 74–106)
Potassium: 3.9 mEq/L (ref 3.5–5.1)
Sodium: 142 mEq/L (ref 136–145)

## 2012-05-25 LAB — GLUCOSE, POC: Glucose (POC): 129 — ABNORMAL HIGH (ref 65–105)

## 2012-05-25 LAB — CKMB PROFILE
CK - MB: 1.6 ng/ml (ref 0.0–3.6)
CK - MB: 1.6 ng/ml (ref 0.0–3.6)
CK - MB: 1.5 ng/ml (ref 0.0–3.6)
CK-MB Index: 1.5 % (ref 0.0–4.9)
CK-MB Index: 1.6 % (ref 0.0–4.9)
CK-MB Index: 1.8 % (ref 0.0–4.9)
CK: 107 U/L (ref 26–192)
CK: 98 U/L (ref 26–192)
CK: 82 U/L (ref 26–192)

## 2012-05-25 LAB — TROPONIN I
Troponin-I: <0.015 ng/ml (ref 0.00–0.09)
Troponin-I: <0.015 ng/ml (ref 0.00–0.09)
Troponin-I: <0.015 ng/ml (ref 0.00–0.09)

## 2012-05-25 NOTE — Discharge Summary (Signed)
[facility]  ED Discharge Summary  NAME:  Cheryl Paul, Cheryl Paul  SEX:   F  DOB: [birth year]  MR#    [id number]  ROOM:  EO02  ACCT#  [id number]        DATE AND TIME OF ADMISSION TO OBSERVATION:  05/25/2012 at 0136    DATE AND TIME OF DISCHARGE FROM OBSERVATION:  05/25/2012 at 1251    The patient was seen in the Emergency Department for evaluation of chest pain.    The evaluation was unremarkable and subsequently the patient was assigned to   observation under chest pain protocol.  Course in Observation:  The patient   remained pain free and did not develop other symptoms.  The cardiac enzymes   were negative and the patient underwent an EST, the results of which were   negative for ischemia.    PHYSICAL EXAM:   VITAL SIGNS:  Blood pressure 142/75, pulse 72, respirations 19, temperature is   98.2, pain is 5 out 10, O2 saturations 97% on room air.    HEENT:  Membranes moist.  LUNGS:  Clear.  HEART:  Regular.  ABDOMEN:  Soft and nontender.      DIAGNOSIS:  Acute chest pain with negative cardiac testing.    PLAN:  Follow up within 1 week with their physician or the on-call doctor.  Should   they have continued, new, or worsening symptoms, such as chest pain or   shortness of breath, they should seek treatment immediately.     It was discussed with the patient that their cardiac evaluation was   unremarkable and does not indicate that immediate intervention is necessary.   The patient was counseled that the testing is not 100 percent accurate and may   generate false negatives.      CLINICAL COURSE:  We discussed the patient's bilateral arm pain.  It is unclear as to what may   be causing her discomfort, but she will follow up with her primary care for   further evaluation.  She denied any fall or injury.  The patient was given   prescriptions for Robaxin and acetaminophen/hydrocodone.  The patient was   personally evaluated by myself and Dr. Juanito Doom who agrees with the above   assessment and plan.       ___________________  Wynelle Bourgeois MD  Dictated NF:AOZHYQM V. Rice, PA-C    My signature above authenticates this document and my orders, the final   diagnosis(es), discharge prescription(s) and instructions in the [facility] record.  SC  D:05/25/2012  T: 05/25/2012 13:37:53  [id number]  Authenticated by Wynelle Bourgeois, MD On 05/25/2012 62:95:28 PM

## 2012-05-25 NOTE — ED Provider Notes (Signed)
[facility]  EMERGENCY DEPARTMENT TREATMENT REPORT  NAME:  Cheryl Paul, Cheryl Paul  SEX:   F  ADMIT: 05/24/2012  DOB:   07/19/1950  MR#    [id number]  ROOM:  EO02  TIME SEEN: 05 43 AM  ACCT#  [id number]    cc: Nickie Retort M.D.    TIME OF EVALUATION:  2255    PRIMARY CARE PROVIDER:  Dr. Kandis Mannan.      CHIEF COMPLAINT:  Bilateral arm pain.    HISTORY OF PRESENT ILLNESS:  This is a 62 year old female presenting to the emergency room today with   complaints of bilateral arm pain over the past 5 days.  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  Pain is described as aching.  She does report   worsening of the pain with movement.  She denies any associated chest pain or   shortness of breath to me.  Has been taking over-the-counter Tylenol and   ibuprofen without any relief and, therefore, is here for further evaluation.    REVIEW OF SYSTEMS:  CONSTITUTIONAL:  No fever.  EYES:  No visual symptoms.   ENT:  No sore throat, runny nose, or other URI symptoms.   RESPIRATORY:  No shortness of breath.  CARDIOVASCULAR:  No chest pain.  GASTROINTESTINAL:  No abdominal pain, vomiting or diarrhea.  GENITOURINARY:  No dysuria.  MUSCULOSKELETAL:  Positive for bilateral shoulder pain.  INTEGUMENTARY:  No rashes.  NEUROLOGIC:  No headache.    PAST MEDICAL HISTORY:  Asthma, diabetes, hyperlipidemia, hypertension, history of surgery to the   abdomen secondary to bowel obstruction, hysterectomy.    SOCIAL HISTORY:  Nonsmoker.    FAMILY HISTORY:  Noncontributory.    MEDICATIONS:  Reviewed in Ibex.    ALLERGIES:  ALSO REVIEWED.    PHYSICAL EXAMINATION:  VITAL SIGNS:  Blood pressure 133/76, pulse of 74, respirations 18, temperature   97.8, pain 10 out of 10, O2 saturation 99% on room air.  GENERAL:  The patient well nourished, well developed, answering questions   appropriately.  HEENT:  Eyes:  Conjunctivae clear, lids normal.  Pupils equal,  symmetrical,   and normally reactive.  ENT: Mouth:  Mucous membranes moist.     RESPIRATORY:  Clear and equal breath sounds.  No respiratory distress,   tachypnea, or accessory muscle use.   CARDIOVASCULAR:   Heart regular, without murmurs, gallops, rubs, or thrills.    Chest wall symmetric, no masses.  No reproducible tenderness to the anterior   chest wall.  GASTROINTESTINAL:  Normoactive bowel sounds.  Abdomen soft and nontender.  MUSCULOSKELETAL:  The patient has no localized midline tenderness to her   cervical, thoracic, lumbar, sacral bodies.  She seems to have paravertebral   tenderness bilaterally of the low cervical, high thoracic region into the   musculature of the shoulders bilaterally.  On my evaluation, it seems that   movement of the neck reproduces pain; however, the patient has full range of   motion of the shoulders without any reproduction of the pain.  There is no   tenderness to the scapula, humeral head, clavicles bilaterally.    NEUROLOGIC:  Alert, oriented. Sensation intact, motor strength equal and   symmetric.     INITIAL ASSESSMENT AND MANAGEMENT PLAN:   This is a 62 year old female here with bilateral arm pain.  On Dr. Larwance Rote   evaluation, he found it more difficult  to reproduce the pain.  The patient is   diabetic and concern for atypical presentation for chest pain etiology;   therefore, additional labs were ordered to consider chest pain etiology.    DIAGNOSTIC  INTERPRETATION:  Dr. Vinnie Langton did not see any acute S-T segment or T-wave abnormalities that   are consistent with acute ischemia or infarction.  Chest x-ray is read by Dr.   Arvella Merles, read as negative for acute cardiopulmonary process.  CBC is   unremarkable, normal white count, normal hemoglobin and hematocrit.  BMP   revealed a glucose of 114, otherwise normal.  Cardiac enzymes within normal    stable.      EMERGENCY ROOM COURSE:  The patient was stable while here in the Emergency Department.  Medicated for    her pain with oral Tylenol, oral aspirin given per chest pain protocol.  The   patient's pain did seem to improve with these medications.  Based on her risk   factors of diabetes, hypertension, she is agreeable to stay in our chest pain   protocol unit for further testing with dobutamine stress echo.  Apparently,   her last stress test was around 4 years ago.    CLINICAL IMPRESSION AND DIAGNOSES:    Chest pain.     DISPOSITION:  The patient admitted to the ED observation unit.  The initial Emergency   Department evaluation of this patient appears to be negative for evidence of   an acute coronary ischemia requiring hospital admission or urgent   intervention.  However, ischemic coronary disease has not been eliminated as a   consideration.  Consequently, the patient will be assigned to the Emergency   Department Observation Unit for serial cardiac enzymes and, if these are   negative, resting and stress echocardiography or other additional diagnostic   testing. The patient was personally evaluated by myself and Dr. Vinnie Langton who   agrees with the above assessment and plan.      ___________________  Smitty Cords MD  Dictated By: Kristeen Mans, PA    My signature above authenticates this document and my orders, the final   diagnosis (es), discharge prescription (s), and instructions in the [facility]   Pul[facility]ck record.  NT  D:05/25/2012  T: 05/25/2012 09:01:49  [id number]  Authenticated by Smitty Cords, M.D. On 05/27/2012 01:22:53 AM

## 2012-05-26 ENCOUNTER — Encounter (HOSPITAL_COMMUNITY): Payer: Self-pay | Admitting: *Deleted

## 2012-05-26 ENCOUNTER — Encounter (HOSPITAL_COMMUNITY)
Admission: RE | Disposition: A | Discharge status: Home / Self Care | Payer: Self-pay | Source: Ambulatory Visit | Attending: Gastroenterology

## 2012-05-26 ENCOUNTER — Ambulatory Visit (HOSPITAL_COMMUNITY)
Admission: RE | Admit: 2012-05-26 | Discharge: 2012-05-26 | Disposition: A | Discharge status: Home / Self Care | Payer: Medicare Other | Source: Ambulatory Visit | Attending: Gastroenterology | Admitting: Gastroenterology

## 2012-05-26 DIAGNOSIS — K449 Diaphragmatic hernia without obstruction or gangrene: Secondary | ICD-10-CM | POA: Insufficient documentation

## 2012-05-26 DIAGNOSIS — K219 Gastro-esophageal reflux disease without esophagitis: Secondary | ICD-10-CM | POA: Insufficient documentation

## 2012-05-26 HISTORY — DX: Hypothyroidism, unspecified: E03.9

## 2012-05-26 HISTORY — DX: Anemia, unspecified: D64.9

## 2012-05-26 HISTORY — DX: Other complications of anesthesia, initial encounter: T88.59XA

## 2012-05-26 HISTORY — DX: Nausea with vomiting, unspecified: R11.2

## 2012-05-26 HISTORY — PX: ESOPHAGOGASTRODUODENOSCOPY: SHX5428

## 2012-05-26 HISTORY — DX: Adverse effect of unspecified anesthetic, initial encounter: T41.45XA

## 2012-05-26 HISTORY — DX: Personal history of other diseases of the digestive system: Z87.19

## 2012-05-26 HISTORY — DX: Gastro-esophageal reflux disease without esophagitis: K21.9

## 2012-05-26 HISTORY — DX: Other specified postprocedural states: Z98.890

## 2012-05-26 HISTORY — PX: BRAVO PH STUDY: SHX5421

## 2012-05-26 SURGERY — EGD (ESOPHAGOGASTRODUODENOSCOPY)
Anesthesia: Moderate Sedation

## 2012-05-26 MED ORDER — DIPHENHYDRAMINE HCL 50 MG/ML IJ SOLN
INTRAMUSCULAR | Status: DC | PRN
  Administered 2012-05-26 (×2): 25 mg via INTRAVENOUS

## 2012-05-26 MED ORDER — ONDANSETRON HCL 4 MG/2ML IJ SOLN
INTRAMUSCULAR | Status: DC | PRN
  Administered 2012-05-26: 4 mg via INTRAVENOUS

## 2012-05-26 MED ORDER — BUTAMBEN-TETRACAINE-BENZOCAINE 2-2-14 % EX AERO
INHALATION_SPRAY | CUTANEOUS | Status: DC | PRN
  Administered 2012-05-26 (×2): 1 via TOPICAL

## 2012-05-26 MED ORDER — MIDAZOLAM HCL 10 MG/2ML IJ SOLN
INTRAMUSCULAR | Status: DC | PRN
  Administered 2012-05-26 (×4): 2 mg via INTRAVENOUS

## 2012-05-26 MED ORDER — FENTANYL CITRATE 0.05 MG/ML IJ SOLN
INTRAMUSCULAR | Status: DC | PRN
  Administered 2012-05-26 (×4): 25 ug via INTRAVENOUS

## 2012-05-26 MED ORDER — FENTANYL CITRATE 0.05 MG/ML IJ SOLN
INTRAMUSCULAR | Status: AC
  Filled 2012-05-26: qty 4

## 2012-05-26 MED ORDER — SODIUM CHLORIDE 0.9 % IV SOLN: INTRAVENOUS | Status: DC

## 2012-05-26 MED ORDER — ONDANSETRON HCL 4 MG/2ML IJ SOLN
INTRAMUSCULAR | Status: AC
  Filled 2012-05-26: qty 2

## 2012-05-26 MED ORDER — SODIUM CHLORIDE 0.9 % IV SOLN
INTRAVENOUS | Status: DC
  Administered 2012-05-26: 14:00:00 via INTRAVENOUS

## 2012-05-26 MED ORDER — MIDAZOLAM HCL 10 MG/2ML IJ SOLN
INTRAMUSCULAR | Status: AC
  Filled 2012-05-26: qty 4

## 2012-05-26 MED ORDER — DIPHENHYDRAMINE HCL 50 MG/ML IJ SOLN
INTRAMUSCULAR | Status: AC
  Filled 2012-05-26: qty 1

## 2012-05-26 NOTE — H&P (Signed)
  Date of Initial H&P: 05/25/12  History reviewed, patient examined, no change in status, stable for surgery. EGD with Bravo placement to measure the pH and see if having acid reflux. To be done off of PPI therapy so advised to not take anymore PPIs until the procedure has completed.

## 2012-05-26 NOTE — Op Note (Signed)
[facility] [address] [place], [address]   ENDOSCOPY PROCEDURE REPORT  PATIENT: Brittany, [name]  MR#: [id number] BIRTHDATE: 09-18-1950 , 61  yrs. old GENDER: Female  ENDOSCOPIST: Charlott Rakes, MD REFERRED XB:JYNWG Little, M.D.  PROCEDURE DATE:  05/26/2012 PROCEDURE:   EGD w/ Bravo capsule placement ASA CLASS:   Class II INDICATIONS:History of esophageal reflux. MEDICATIONS: Fentanyl 100 mcg IV, Versed 8 mg IV, Diphenhydramine (Benadryl) 50 mg IV, Zofran 4 mg IV, and Cetacaine spray x 2  TOPICAL ANESTHETIC:  DESCRIPTION OF PROCEDURE:   After the risks benefits and alternatives of the procedure were thoroughly explained, informed consent was obtained.  The Pentax Gastroscope [id number]  endoscope was introduced through the mouth and advanced to the second portion of the duodenum , limited by Without limitations.   The instrument was slowly withdrawn as the mucosa was fully examined.     FINDINGS: The endoscope was inserted into the oropharynx and esophagus was intubated.  The gastroesophageal junction was noted to be 36 cm from the incisors. The esophagus was normal in appearance.  Endoscope was advanced into the stomach, which revealed normal appearing gastric mucosa.  The endoscope was advanced to the duodenal bulb and second portion of duodenum which were unremarkable.  The endoscope was withdrawn back into the stomach and retroflexion revealed a small hiatal hernia. The GEJ was confirmed at 36 cm and the endoscope was withdrawn. The Bravo capsule catheter was marked at 30 cm ( 6 cm above the GEJ) and passed without difficulty into the esophagus. The Bravo capsule was deployed in the usual fashion and placement was confirmed. The Bravo capsule was confirmed to be reading the pH prior to completion of the procedure.  COMPLICATIONS: None  ENDOSCOPIC IMPRESSION:     1. S/P Bravo capsule placement to measure pH off of PPIs 2. Small hiatal  hernia  RECOMMENDATIONS: F/U on Bravo capsule results   REPEAT EXAM: N/A  _______________________________ Charlott Rakes, MD eSigned:  Charlott Rakes, MD 05/26/2012 3:15 PM    NF:AOZHY Little, MD  PATIENT NAME:  Brittany [name] MR#: [id number]

## 2012-05-26 NOTE — Interval H&P Note (Signed)
History and Physical Interval Note:  05/26/2012 2:37 PM  Brittany [name]  has presented today for surgery, with the diagnosis of reflux/  The various methods of treatment have been discussed with the patient and family. After consideration of risks, benefits and other options for treatment, the patient has consented to  Procedure(s) (LRB) with comments: ESOPHAGOGASTRODUODENOSCOPY (EGD) (N/A) BRAVO PH STUDY (N/A) as a surgical intervention .  [name] history has been reviewed, patient examined, no change in status, stable for surgery.  I have reviewed the patient's chart and labs.  Questions were answered to the patient's satisfaction.     Jamilyn Pigeon C.

## 2012-05-27 ENCOUNTER — Encounter (HOSPITAL_COMMUNITY): Payer: Self-pay | Admitting: Gastroenterology

## 2012-05-30 ENCOUNTER — Encounter (HOSPITAL_COMMUNITY): Payer: Self-pay

## 2012-05-30 ENCOUNTER — Encounter (HOSPITAL_COMMUNITY)
Admission: RE | Disposition: A | Discharge status: Home / Self Care | Payer: Self-pay | Source: Ambulatory Visit | Attending: Gastroenterology

## 2012-05-30 ENCOUNTER — Ambulatory Visit (HOSPITAL_COMMUNITY)
Admission: RE | Admit: 2012-05-30 | Discharge: 2012-05-30 | Disposition: A | Discharge status: Home / Self Care | Payer: Medicare Other | Source: Ambulatory Visit | Attending: Gastroenterology | Admitting: Gastroenterology

## 2012-05-30 DIAGNOSIS — E7800 Pure hypercholesterolemia, unspecified: Secondary | ICD-10-CM | POA: Insufficient documentation

## 2012-05-30 DIAGNOSIS — K449 Diaphragmatic hernia without obstruction or gangrene: Secondary | ICD-10-CM | POA: Insufficient documentation

## 2012-05-30 DIAGNOSIS — R1310 Dysphagia, unspecified: Secondary | ICD-10-CM | POA: Insufficient documentation

## 2012-05-30 DIAGNOSIS — K219 Gastro-esophageal reflux disease without esophagitis: Secondary | ICD-10-CM | POA: Insufficient documentation

## 2012-05-30 DIAGNOSIS — E039 Hypothyroidism, unspecified: Secondary | ICD-10-CM | POA: Insufficient documentation

## 2012-05-30 HISTORY — PX: ESOPHAGOGASTRODUODENOSCOPY: SHX5428

## 2012-05-30 SURGERY — EGD (ESOPHAGOGASTRODUODENOSCOPY)
Anesthesia: Moderate Sedation

## 2012-05-30 MED ORDER — MIDAZOLAM HCL 10 MG/2ML IJ SOLN
INTRAMUSCULAR | Status: AC
  Filled 2012-05-30: qty 2

## 2012-05-30 MED ORDER — SODIUM CHLORIDE 0.9 % IV SOLN: INTRAVENOUS | Status: DC

## 2012-05-30 MED ORDER — MIDAZOLAM HCL 10 MG/2ML IJ SOLN
INTRAMUSCULAR | Status: DC | PRN
  Administered 2012-05-30 (×4): 2 mg via INTRAVENOUS

## 2012-05-30 MED ORDER — ONDANSETRON HCL 4 MG/2ML IJ SOLN
INTRAMUSCULAR | Status: DC | PRN
  Administered 2012-05-30: 4 mg via INTRAVENOUS

## 2012-05-30 MED ORDER — ONDANSETRON HCL 4 MG/2ML IJ SOLN
INTRAMUSCULAR | Status: AC
  Filled 2012-05-30: qty 2

## 2012-05-30 MED ORDER — SODIUM CHLORIDE 0.9 % IV SOLN
INTRAVENOUS | Status: DC
  Administered 2012-05-30: 500 mL via INTRAVENOUS

## 2012-05-30 MED ORDER — DIPHENHYDRAMINE HCL 50 MG/ML IJ SOLN
INTRAMUSCULAR | Status: DC | PRN
  Administered 2012-05-30 (×2): 25 mg via INTRAVENOUS

## 2012-05-30 MED ORDER — FENTANYL CITRATE 0.05 MG/ML IJ SOLN
INTRAMUSCULAR | Status: DC | PRN
  Administered 2012-05-30 (×4): 25 ug via INTRAVENOUS

## 2012-05-30 MED ORDER — DIPHENHYDRAMINE HCL 50 MG/ML IJ SOLN
INTRAMUSCULAR | Status: AC
  Filled 2012-05-30: qty 1

## 2012-05-30 MED ORDER — BUTAMBEN-TETRACAINE-BENZOCAINE 2-2-14 % EX AERO
INHALATION_SPRAY | CUTANEOUS | Status: DC | PRN
  Administered 2012-05-30: 2 via TOPICAL

## 2012-05-30 MED ORDER — FENTANYL CITRATE 0.05 MG/ML IJ SOLN
INTRAMUSCULAR | Status: AC
  Filled 2012-05-30: qty 2

## 2012-05-30 NOTE — Op Note (Signed)
[facility] [address] [place], [address]   ENDOSCOPY PROCEDURE REPORT  PATIENT: Brittany, [name]  MR#: [id number] BIRTHDATE: 02-25-1951 , 61  yrs. old GENDER: Female ENDOSCOPIST:Catina Nuss, MD REFERRED BY:  Dr. Aida Puffer PROCEDURE DATE:  05/30/2012 PROCEDURE:      Upper Endoscopy ASA CLASS: INDICATIONS:   Food impaction 4 days s/p Bravo capsule placement MEDICATION:    Zofran 4 mg IV, Benadryl 50 mg IV, fentanyl 100 mcg IV, Versed 10 mg IV TOPICAL ANESTHETIC:    Cetacaine spray  DESCRIPTION OF PROCEDURE:   the patient came as an outpatient to the [facility] long endoscopy unit, for the purpose of returning her bravo capsule monitoring equipment. However, when she arrived to the endoscopy unit, she reported that the chicken sandwich she had eaten for lunch had become lodged in her esophagus, and she was unable to swallow. She was spitting up moderately large amounts of clear secretions. Therefore, endoscopic evaluation was felt to be needed, for relief of a food impaction.  She was sedated with the above medications after providing written consent and undergoing "time out." The Pentax adult video endoscope was passed under direct vision. The vocal cords looked normal. The esophagus was readily entered under direct vision.  Interestingly, there was no food impaction present by the time we passed the scope. The bravo capsule was present in the distal esophagus, but there was no food in the esophagus whatsoever. The esophageal mucosa was normal. A 1 cm hiatal hernia was present, without any obvious ring or stricture.  The stomach was entered. It contained a small bilious residual, perhaps 30 cc. There was some food debris floating in the bile. The gastric mucosa was unremarkable, specifically without evidence of erosions or ulcers, and a retroflex view the cardia was unremarkable.  The pylorus, duodenal bulb, and second duodenum looked normal.  The  scope was then removed from the patient. No biopsies were obtained. No interventions were performed. I did attempt to gently nudge the bravo capsule with the tip of the endoscope to see if I would be able to break up free and get it out of the esophageal lumen, as a potential source for future obstruction. However, it held fast and I did not want to force it off.  The patient tolerated this procedure well. There was no clinical instability throughout the procedure.     COMPLICATIONS: None  ENDOSCOPIC IMPRESSION:  no food impaction remaining at the time this procedure. Bravo capsule still present  RECOMMENDATIONS:  mechanical soft diet for the next 2-3 weeks, by which time the bravo capsule will most likely have broken loose and passed spontaneously.    _______________________________ eSignedBernette Redbird, MD 05/30/2012 5:13 PM    PATIENT NAME:  Brittany, [name]

## 2012-05-30 NOTE — H&P (Signed)
  62 year old female presents to the endoscopy unit with a food impaction, approximately 4 days status post placement of a bravo capsule for pH monitoring. She had a chicken sandwich for lunch and that stuck there and she's been having secretions, but ever since. These symptoms came to light when she returned to the endoscopy unit today to turn in the monitoring equipment.  Past medical history: GERD, hypercholesterolemia, hypothyroidism, depression, anemia  Operations: TMJ surgery, tubal ligation, hysterectomy, cholecystectomy, neck surgery, thyroid surgery  Physical exam: Vital signs normal. Patient coughing repeatedly but lungs are clear. She's bringing up clear mucous salivary secretions. Heart normal. Abdomen soft and nontender. Oropharynx benign. No pallor or icterus.  Impression: Food impaction, probably due to chicken hung up on her bravo capsule  Plan: Proceed to endoscopic evaluation. The patient is already familiar with the procedure but the risks were reviewed and she is agreeable to proceed.  Florencia Reasons, M.D. [phone]

## 2012-05-31 ENCOUNTER — Encounter (HOSPITAL_COMMUNITY): Payer: Self-pay | Admitting: Gastroenterology

## 2012-05-31 ENCOUNTER — Ambulatory Visit (HOSPITAL_COMMUNITY): Admit: 2012-05-31 | Payer: Self-pay | Admitting: Gastroenterology

## 2012-06-02 ENCOUNTER — Ambulatory Visit (INDEPENDENT_AMBULATORY_CARE_PROVIDER_SITE_OTHER): Payer: Medicare Other | Admitting: Family Medicine

## 2012-06-02 ENCOUNTER — Ambulatory Visit: Payer: Medicare Other

## 2012-06-02 VITALS — BP 97/62 | HR 67 | Temp 97.7°F | Resp 16 | Ht 66.0 in | Wt 128.0 lb

## 2012-06-02 DIAGNOSIS — M549 Dorsalgia, unspecified: Secondary | ICD-10-CM

## 2012-06-02 DIAGNOSIS — R509 Fever, unspecified: Secondary | ICD-10-CM

## 2012-06-02 DIAGNOSIS — J690 Pneumonitis due to inhalation of food and vomit: Secondary | ICD-10-CM

## 2012-06-02 LAB — POCT CBC
Granulocyte percent: 49.7 %G (ref 37–80)
HCT, POC: 38.5 % (ref 37.7–47.9)
Hemoglobin: 11.4 g/dL — AB (ref 12.2–16.2)
Lymph, poc: 3.9 — AB (ref 0.6–3.4)
MCH, POC: 28.1 pg (ref 27–31.2)
MCHC: 29.6 g/dL — AB (ref 31.8–35.4)
MCV: 95.1 fL (ref 80–97)
MID (cbc): 0.5 (ref 0–0.9)
MPV: 9 fL (ref 0–99.8)
POC Granulocyte: 4.3 (ref 2–6.9)
POC LYMPH PERCENT: 45 %L (ref 10–50)
POC MID %: 5.3 %M (ref 0–12)
Platelet Count, POC: 435 10*3/uL — AB (ref 142–424)
RBC: 4.05 M/uL (ref 4.04–5.48)
RDW, POC: 14.3 %
WBC: 8.6 10*3/uL (ref 4.6–10.2)

## 2012-06-02 MED ORDER — LEVOFLOXACIN 500 MG PO TABS: 500.0000 mg | ORAL_TABLET | Freq: Every day | ORAL | Status: DC

## 2012-06-02 MED ORDER — CEFTRIAXONE SODIUM 1 G IJ SOLR
1.0000 g | INTRAMUSCULAR | Status: DC
  Administered 2012-06-02: 1 g via INTRAMUSCULAR

## 2012-06-02 NOTE — Patient Instructions (Addendum)
Aspiration Pneumonia Aspiration pneumonia is an infection in your lungs. It occurs when you breathe (aspirate) things into your lungs such as food, vomit, or liquid. When these things get into your lungs, swelling (inflammation) can occur. This can make it difficult for you to breath. Aspiration pneumonia is a serious condition and can be life threatening.  CAUSES  Aspiration pneumonia can have many causes. Some of the causes include:  Having a brain injury or disease:  Stroke.  Seizures.  Confusion (Dementia).  ALS (Amyotrophic Lateral Sclerosis, or Lou Gehrig's disease).  Parkinson's disease.  Other causes of aspiration pneumonia include:  Being under general anesthesia for procedures.  Being in a coma (unconscious). This unconscious state can be caused by medicine, illegal drugs, injury or disease. Being in a coma can decrease a person's cough (gag) reflex. Aspiration can occur when a person is not awake enough to cough if something goes into the lungs.  A narrowing of the esophagus (the tube that carries food to the stomach).  Having dental problems that makes it hard to swallow.  Drinking too much alcohol. If a person passes out and vomits, vomit can be swallowed into the lungs.  Taking certain medications. Tranquilizers and sedatives can sometimes decrease your swallowing or gag reflex. SYMPTOMS   Coughing after swallowing food or liquids.  Breathing problems. These could include wheezing (a whistling sound) or shortness of breath.  Bluish skin. This can be caused by lack of oxygen.  Coughing up food or mucus. The mucus might contain blood, pus or greenish material.  Fever.  Chest pain.  Fatigue (being more tired than usual).  Sweating more than usual.  Bad breath. DIAGNOSIS  A physical examination and testing will be needed to see if aspiration pneumonia is present. This can include:  A review of the above symptoms.  Chest X-ray. This is a picture of the  lungs.  Listening to your lungs with a stethoscope. Your healthcare provider will listen for:  Crackling sounds in the lungs.  Decreased breath sounds.  A rapid heartbeat.  Swallowing study. This test looks at how food is swallowed and whether it goes into your breathing tube (trachea) or food pipe (esophagus).  Sputum culture. Sputum (saliva and mucus) is collected from the lungs or bronchi (tubes that carry air to the lungs). It is then tested for bacteria.  Computed tomography. This is called a CT scan. It also can show lung damage.  Bronchoscopy. This test uses a flexible tube (bronchoscope) to see inside the lungs. TREATMENT  Treatment will depend on how severe the aspiration pneumonia is and what led to it.  Some people may need to be treated in the hospital. Your breathing will be carefully monitored. Depending on how well you are breathing, you may:  Be able to breath on your own but need oxygen.  Need breathing support via a breathing machine (ventilator).  Medication:  Antibiotics or anti-fungal drugs might be prescribed. The particular medicine will depend on what caused the infection.  Other drugs may be given to reduce fever and/or pain.  Other treatments or corrections may be needed. For example:  Following a recommended diet. This is especially important if the swallowing study was failed.  Revising medications.  Fixing dental problems.  Correcting breathing obstructions.  Treating stomach disorders.  Dealing with alcohol issues.  Having a feeding tube placed in the stomach. HOME CARE INSTRUCTIONS   Take any medicines that were prescribed. Follow the directions carefully.  Check with your caregiver  before taking over-the-counter medications.  Rest as instructed by your caregiver.  Keep all follow-up appointments with your healthcare provider. This is important so the caregiver can make sure the pneumonia is gone. SEEK MEDICAL CARE IF:  Any of  these symptoms return:  Shortness of breath or difficulty breathing.  Wheezing.  Fever of more than 100.5 F (38.1 C) or as recommended by your caregiver.  Chest pain. MAKE SURE YOU:   Understand these instructions.  Will watch your condition.  Will get help right away if you are not doing well or get worse. Document Released: 03/01/2009 Document Revised: 07/27/2011 Document Reviewed: 03/01/2009 [facility] Patient Information 2013 [place], [place].

## 2012-06-02 NOTE — Progress Notes (Signed)
Subjective:    Patient ID: Brittany [name], female    DOB: [birth year], 62 y.o.   MRN: [id number] Chief Complaint  Patient presents with  . Aspiration    felt like she aspirated on Monday  . Cough  . Pneumonia    concerned that she has pneumonia  . Fever    HPI  Brittany [name] is a 62 yo female who 1 wk prev went to [facility] for endoscopy and pH capsule monitor put into throat.  Returned Monday to have it removed (4d ago) and after eating lunch she regurgitated - felt her food lodge in throat. Was coughing a lot and was concerned she aspirated into lung as now sxs feel similar to when she had pneumonia prior.  Her main symptom is a lot of right side back pain. She is having fever spikes - highest 100.5, sweats. occ chills.  No cough - it has completely resolved. No energy. Just feels horrible.  Past Medical History  Diagnosis Date  . Hypercholesteremia   . Complication of anesthesia   . PONV (postoperative nausea and vomiting)   . Hypothyroidism   . Depression   . GERD (gastroesophageal reflux disease)   . H/O hiatal hernia   . Anemia   . Cataract   . Anxiety   . Osteoporosis    Current Outpatient Prescriptions on File Prior to Visit  Medication Sig Dispense Refill  . aspirin 81 MG tablet Take 81 mg by mouth daily.      . calcium carbonate (OS-CAL) 600 MG TABS Take 600 mg by mouth 2 (two) times daily with a meal.      . citalopram (CELEXA) 40 MG tablet Take 40 mg by mouth daily.      . clorazepate (TRANXENE) 7.5 MG tablet Take 4 mg by mouth daily as needed. Anxiety      . estradiol (ESTRACE) 1 MG tablet Take 1 mg by mouth daily.      Marland Kitchen levothyroxine (SYNTHROID, LEVOTHROID) 25 MCG tablet Take 25 mcg by mouth daily.      . Multiple Vitamins-Minerals (WOMENS 50+ MULTI VITAMIN/MIN PO) Take 1 tablet by mouth daily.      . traZODone (DESYREL) 50 MG tablet Take 50 mg by mouth at bedtime.       No current facility-administered medications on file prior to visit.   Allergies  Allergen Reactions    . Lithium Anaphylaxis  . Morphine Nausea And Vomiting  . Pseudoephedrine Hypertension     Review of Systems  Constitutional: Positive for fever, chills, diaphoresis, activity change, appetite change and fatigue. Negative for unexpected weight change.  HENT: Positive for sore throat. Negative for congestion, rhinorrhea and sinus pressure.   Respiratory: Negative for cough, chest tightness, shortness of breath and wheezing.   Cardiovascular: Negative for chest pain, palpitations and leg swelling.  Gastrointestinal: Positive for abdominal pain. Negative for nausea, vomiting, diarrhea and constipation.  Musculoskeletal: Positive for myalgias, back pain and arthralgias.  Skin: Negative for rash.  Hematological: Negative for adenopathy.  Psychiatric/Behavioral: Negative for sleep disturbance.      BP 97/62  Pulse 67  Temp 97.7 F (36.5 C) (Oral)  Resp 16  Ht 5\' 6"  (1.676 m)  Wt 128 lb (58.06 kg)  BMI 20.66 kg/m2  SpO2 99% Objective:   Physical Exam  Constitutional: She is oriented to person, place, and time. She appears well-developed and well-nourished. No distress.  HENT:  Head: Normocephalic and atraumatic.  Right Ear: Tympanic membrane, external ear and ear  canal normal.  Left Ear: Tympanic membrane, external ear and ear canal normal.  Nose: Nose normal. No mucosal edema or rhinorrhea.  Mouth/Throat: Uvula is midline, oropharynx is clear and moist and mucous membranes are normal. No oropharyngeal exudate.  Eyes: Conjunctivae normal are normal. Right eye exhibits no discharge. Left eye exhibits no discharge. No scleral icterus.  Neck: Normal range of motion. Neck supple.  Cardiovascular: Normal rate, regular rhythm, normal heart sounds and intact distal pulses.   Pulmonary/Chest: Effort normal and breath sounds normal.  Abdominal: Soft. Bowel sounds are normal. She exhibits no distension and no mass. There is no tenderness. There is no rebound and no guarding.   Musculoskeletal:       Thoracic back: She exhibits tenderness and bony tenderness.  Lymphadenopathy:    She has no cervical adenopathy.  Neurological: She is alert and oriented to person, place, and time.  Skin: Skin is warm and dry. She is not diaphoretic. No erythema.  Psychiatric: She has a normal mood and affect. Her behavior is normal.      Results for orders placed in visit on 06/02/12  POCT CBC      Component Value Range   WBC 8.6  4.6 - 10.2 K/uL   Lymph, poc 3.9 (*) 0.6 - 3.4   POC LYMPH PERCENT 45.0  10 - 50 %L   MID (cbc) 0.5  0 - 0.9   POC MID % 5.3  0 - 12 %M   POC Granulocyte 4.3  2 - 6.9   Granulocyte percent 49.7  37 - 80 %G   RBC 4.05  4.04 - 5.48 M/uL   Hemoglobin 11.4 (*) 12.2 - 16.2 g/dL   HCT, POC 16.1  09.6 - 47.9 %   MCV 95.1  80 - 97 fL   MCH, POC 28.1  27 - 31.2 pg   MCHC 29.6 (*) 31.8 - 35.4 g/dL   RDW, POC 04.5     Platelet Count, POC 435 (*) 142 - 424 K/uL   MPV 9.0  0 - 99.8 fL      [facility] reading (PRIMARY) by  Dr. Clelia Croft.  Right central lung infiltrate  Assessment & Plan:  Aspiration pneumonia - Rocephin 1g IM x 1 now.  Start levaquin - warned pt that true aspiration pna often requires IV anbitiotics so will need to f/u closely and if any worsening - SHoB, higher fevers, etc -> to ER.  Recheck in 2-3d.  If no improvement, could consider adding clindamycin?

## 2012-06-04 ENCOUNTER — Encounter: Payer: Self-pay | Admitting: Family Medicine

## 2012-06-04 ENCOUNTER — Ambulatory Visit (INDEPENDENT_AMBULATORY_CARE_PROVIDER_SITE_OTHER): Payer: Medicare Other | Admitting: Family Medicine

## 2012-06-04 VITALS — BP 90/58 | HR 70 | Temp 97.5°F | Resp 16 | Ht 65.5 in | Wt 127.0 lb

## 2012-06-04 DIAGNOSIS — J690 Pneumonitis due to inhalation of food and vomit: Secondary | ICD-10-CM

## 2012-06-04 MED ORDER — ALBUTEROL SULFATE (2.5 MG/3ML) 0.083% IN NEBU
2.5000 mg | INHALATION_SOLUTION | Freq: Once | RESPIRATORY_TRACT | Status: AC
  Administered 2012-06-04: 2.5 mg via RESPIRATORY_TRACT

## 2012-06-04 MED ORDER — METHYLPREDNISOLONE ACETATE 80 MG/ML IJ SUSP
80.0000 mg | Freq: Once | INTRAMUSCULAR | Status: AC
  Administered 2012-06-04: 80 mg via INTRAMUSCULAR

## 2012-06-04 NOTE — Progress Notes (Signed)
62 yo woman who was diagnosed with aspiration pneumonia after GERD monitor removed last Monday by endoscopy.  A capsule that was inserted in throat "remains."   She was seen two days ago and given Rocephin and started on Levaquin,    Feels worse today than Thursday.  More short of breath with increased chest pain (central).  She is still regurgitating despite being put on soft diet.  Patient has remote h/o asthma  Objective:  NAD, somewhat pale. Neck: supple Chest:  Clear to ausculatation Heart:  Reg, no murmur. Ext:  No edema  Results for orders placed in visit on 06/02/12  POCT CBC      Component Value Range   WBC 8.6  4.6 - 10.2 K/uL   Lymph, poc 3.9 (*) 0.6 - 3.4   POC LYMPH PERCENT 45.0  10 - 50 %L   MID (cbc) 0.5  0 - 0.9   POC MID % 5.3  0 - 12 %M   POC Granulocyte 4.3  2 - 6.9   Granulocyte percent 49.7  37 - 80 %G   RBC 4.05  4.04 - 5.48 M/uL   Hemoglobin 11.4 (*) 12.2 - 16.2 g/dL   HCT, POC 28.4  13.2 - 47.9 %   MCV 95.1  80 - 97 fL   MCH, POC 28.1  27 - 31.2 pg   MCHC 29.6 (*) 31.8 - 35.4 g/dL   RDW, POC 44.0     Platelet Count, POC 435 (*) 142 - 424 K/uL   MPV 9.0  0 - 99.8 fL   Clear lungs, jittery after albuterol treatment;  No symptomatic relief  Assessment:  Very little objective today to base further treatment on.  She seems stable at present, but anxious and has poor eye contact.  Plan:  1. Aspiration pneumonia  albuterol (PROVENTIL) (2.5 MG/3ML) 0.083% nebulizer solution 2.5 mg, methylPREDNISolone acetate (DEPO-MEDROL) injection 80 mg   Recheck 24-48 hours

## 2012-08-30 ENCOUNTER — Emergency Department (HOSPITAL_COMMUNITY): Payer: Medicare Other

## 2012-08-30 ENCOUNTER — Encounter (HOSPITAL_COMMUNITY): Payer: Self-pay | Admitting: Emergency Medicine

## 2012-08-30 ENCOUNTER — Observation Stay (HOSPITAL_COMMUNITY)
Admission: EM | Admit: 2012-08-30 | Discharge: 2012-09-02 | Disposition: A | Discharge status: Home / Self Care | Payer: Medicare Other | Attending: Internal Medicine | Admitting: Internal Medicine

## 2012-08-30 DIAGNOSIS — K219 Gastro-esophageal reflux disease without esophagitis: Secondary | ICD-10-CM | POA: Insufficient documentation

## 2012-08-30 DIAGNOSIS — R4182 Altered mental status, unspecified: Secondary | ICD-10-CM | POA: Insufficient documentation

## 2012-08-30 DIAGNOSIS — R1084 Generalized abdominal pain: Secondary | ICD-10-CM | POA: Insufficient documentation

## 2012-08-30 DIAGNOSIS — K922 Gastrointestinal hemorrhage, unspecified: Secondary | ICD-10-CM | POA: Diagnosis present

## 2012-08-30 DIAGNOSIS — R531 Weakness: Secondary | ICD-10-CM

## 2012-08-30 DIAGNOSIS — R29898 Other symptoms and signs involving the musculoskeletal system: Secondary | ICD-10-CM | POA: Insufficient documentation

## 2012-08-30 DIAGNOSIS — D649 Anemia, unspecified: Secondary | ICD-10-CM | POA: Insufficient documentation

## 2012-08-30 DIAGNOSIS — J449 Chronic obstructive pulmonary disease, unspecified: Secondary | ICD-10-CM | POA: Diagnosis present

## 2012-08-30 DIAGNOSIS — F17200 Nicotine dependence, unspecified, uncomplicated: Secondary | ICD-10-CM | POA: Diagnosis present

## 2012-08-30 DIAGNOSIS — J4489 Other specified chronic obstructive pulmonary disease: Secondary | ICD-10-CM | POA: Diagnosis present

## 2012-08-30 DIAGNOSIS — R1110 Vomiting, unspecified: Secondary | ICD-10-CM | POA: Insufficient documentation

## 2012-08-30 DIAGNOSIS — R0789 Other chest pain: Secondary | ICD-10-CM

## 2012-08-30 DIAGNOSIS — K921 Melena: Principal | ICD-10-CM | POA: Insufficient documentation

## 2012-08-30 DIAGNOSIS — K449 Diaphragmatic hernia without obstruction or gangrene: Secondary | ICD-10-CM | POA: Diagnosis present

## 2012-08-30 LAB — SAMPLE TO BLOOD BANK

## 2012-08-30 LAB — CBC WITH DIFFERENTIAL/PLATELET
Basophils Absolute: 0 10*3/uL (ref 0.0–0.1)
Basophils Relative: 0 % (ref 0–1)
Eosinophils Absolute: 0.2 10*3/uL (ref 0.0–0.7)
Eosinophils Relative: 4 % (ref 0–5)
HCT: 30.5 % — ABNORMAL LOW (ref 36.0–46.0)
Hemoglobin: 9.9 g/dL — ABNORMAL LOW (ref 12.0–15.0)
Lymphocytes Relative: 55 % — ABNORMAL HIGH (ref 12–46)
Lymphs Abs: 3.2 10*3/uL (ref 0.7–4.0)
MCH: 29.4 pg (ref 26.0–34.0)
MCHC: 32.5 g/dL (ref 30.0–36.0)
MCV: 90.5 fL (ref 78.0–100.0)
Monocytes Absolute: 0.8 10*3/uL (ref 0.1–1.0)
Monocytes Relative: 14 % — ABNORMAL HIGH (ref 3–12)
Neutro Abs: 1.6 10*3/uL — ABNORMAL LOW (ref 1.7–7.7)
Neutrophils Relative %: 28 % — ABNORMAL LOW (ref 43–77)
Platelets: 206 10*3/uL (ref 150–400)
RBC: 3.37 MIL/uL — ABNORMAL LOW (ref 3.87–5.11)
RDW: 14.1 % (ref 11.5–15.5)
WBC: 5.8 10*3/uL (ref 4.0–10.5)

## 2012-08-30 LAB — OCCULT BLOOD, POC DEVICE: Fecal Occult Bld: POSITIVE — AB

## 2012-08-30 LAB — COMPREHENSIVE METABOLIC PANEL
ALT: 12 U/L (ref 0–35)
AST: 21 U/L (ref 0–37)
Albumin: 3.3 g/dL — ABNORMAL LOW (ref 3.5–5.2)
Alkaline Phosphatase: 86 U/L (ref 39–117)
BUN: 9 mg/dL (ref 6–23)
CO2: 28 mEq/L (ref 19–32)
Calcium: 9.1 mg/dL (ref 8.4–10.5)
Chloride: 108 mEq/L (ref 96–112)
Creatinine, Ser: 0.9 mg/dL (ref 0.50–1.10)
GFR calc Af Amer: 79 mL/min — ABNORMAL LOW (ref >90)
GFR calc non Af Amer: 68 mL/min — ABNORMAL LOW (ref >90)
Glucose, Bld: 82 mg/dL (ref 70–99)
Potassium: 3.9 mEq/L (ref 3.5–5.1)
Sodium: 142 mEq/L (ref 135–145)
Total Bilirubin: 0.1 mg/dL — ABNORMAL LOW (ref 0.3–1.2)
Total Protein: 6.7 g/dL (ref 6.0–8.3)

## 2012-08-30 LAB — LACTIC ACID, PLASMA: Lactic Acid, Venous: 0.7 mmol/L (ref 0.5–2.2)

## 2012-08-30 LAB — LIPASE, BLOOD: Lipase: 36 U/L (ref 11–59)

## 2012-08-30 LAB — TROPONIN I: Troponin I: <0.3 ng/mL (ref <0.30)

## 2012-08-30 MED ORDER — ONDANSETRON HCL 4 MG/2ML IJ SOLN
4.0000 mg | INTRAMUSCULAR | Status: AC | PRN
  Administered 2012-08-31 – 2012-09-01 (×2): 4 mg via INTRAVENOUS
  Filled 2012-08-30 (×2): qty 2

## 2012-08-30 MED ORDER — FENTANYL CITRATE 0.05 MG/ML IJ SOLN
50.0000 ug | INTRAMUSCULAR | Status: DC | PRN
  Administered 2012-08-30: 50 ug via INTRAVENOUS
  Filled 2012-08-30: qty 2

## 2012-08-30 MED ORDER — IOHEXOL 300 MG/ML  SOLN
50.0000 mL | Freq: Once | INTRAMUSCULAR | Status: AC | PRN
  Administered 2012-08-30: 50 mL via ORAL

## 2012-08-30 MED ORDER — PANTOPRAZOLE SODIUM 40 MG IV SOLR
8.0000 mg/h | INTRAVENOUS | Status: DC
  Administered 2012-08-30 – 2012-09-01 (×4): 8 mg/h via INTRAVENOUS
  Filled 2012-08-30 (×9): qty 80

## 2012-08-30 MED ORDER — SODIUM CHLORIDE 0.9 % IV SOLN
INTRAVENOUS | Status: DC
  Administered 2012-08-30: 21:00:00 via INTRAVENOUS

## 2012-08-30 MED ORDER — SODIUM CHLORIDE 0.9 % IV SOLN
80.0000 mg | Freq: Once | INTRAVENOUS | Status: AC
  Administered 2012-08-30: 80 mg via INTRAVENOUS
  Filled 2012-08-30 (×2): qty 80

## 2012-08-30 NOTE — ED Notes (Signed)
PT. REPORTS DARK STOOLS /DARK EMESIS ONSET YESTERDAY WITH GENERALIZED ABDOMINAL CRAMPING .

## 2012-08-30 NOTE — ED Provider Notes (Signed)
History     CSN: [id number]  Arrival date & time 08/30/12  2019   First MD Initiated Contact with Patient 08/30/12 2029      Chief Complaint  Patient presents with  . GI Bleeding     HPI Pt was seen at 2035.   Per pt, c/o gradual onset and persistence of constant generalized abd "pain" for the past 3 days.  Has been associated with multiple intermittent episodes of N/V/D.  Describes the abd pain as "cramping."  Describes the emesis and diarrhea as "dark" since yesterday.  Denies fevers, no back pain, no rash, no CP/SOB, no blood in stools or emesis.      Past Medical History  Diagnosis Date  . Hypercholesteremia   . Complication of anesthesia   . PONV (postoperative nausea and vomiting)   . Hypothyroidism   . Depression   . GERD (gastroesophageal reflux disease)   . H/O hiatal hernia   . Anemia   . Cataract   . Anxiety   . Osteoporosis     Past Surgical History  Procedure Laterality Date  . Hip surgery  1970s    "hip sunk in"  . Tubal ligation  1981  . Tmj arthroplasty  1992  . Abdominal hysterectomy  2002    total  . Cholecystectomy  2006  . Neck surgery  2012  . Thyroid surgery      nodule removed, right side  . Rotator cuff repair  2013 november    right side  . Esophagogastroduodenoscopy  05/26/2012    Procedure: ESOPHAGOGASTRODUODENOSCOPY (EGD);  Surgeon: Shirley Friar, MD;  Location: [facility];  Service: Endoscopy;  Laterality: N/A;  . Bravo ph study  05/26/2012    Procedure: BRAVO PH STUDY;  Surgeon: Shirley Friar, MD;  Location: [facility];  Service: Endoscopy;  Laterality: N/A;  . Esophagogastroduodenoscopy  05/30/2012    Procedure: ESOPHAGOGASTRODUODENOSCOPY (EGD);  Surgeon: Florencia Reasons, MD;  Location: [facility];  Service: Endoscopy;  Laterality: N/A;  . Eye surgery      Family History  Problem Relation Age of Onset  . Hypertension Mother   . Kidney disease Mother   . Cancer Sister     History  Substance Use Topics  .  Smoking status: Former Smoker    Quit date: 05/18/2008  . Smokeless tobacco: Not on file  . Alcohol Use: No    Review of Systems ROS: Statement: All systems negative except as marked or noted in the HPI; Constitutional: Negative for fever and chills. ; ; Eyes: Negative for eye pain, redness and discharge. ; ; ENMT: Negative for ear pain, hoarseness, nasal congestion, sinus pressure and sore throat. ; ; Cardiovascular: Negative for chest pain, palpitations, diaphoresis, dyspnea and peripheral edema. ; ; Respiratory: Negative for cough, wheezing and stridor. ; ; Gastrointestinal: +abd pain, N/V/D, black stools and emesis. Negative for blood in stool, hematemesis, jaundice and rectal bleeding. . ; ; Genitourinary: Negative for dysuria, flank pain and hematuria. ; ; Musculoskeletal: Negative for back pain and neck pain. Negative for swelling and trauma.; ; Skin: Negative for pruritus, rash, abrasions, blisters, bruising and skin lesion.; ; Neuro: Negative for headache, lightheadedness and neck stiffness. Negative for weakness, altered level of consciousness , altered mental status, extremity weakness, paresthesias, involuntary movement, seizure and syncope.       Allergies  Lithium; Morphine; and Pseudoephedrine  Home Medications   Current Outpatient Rx  Name  Route  Sig  Dispense  Refill  . aluminum  hydroxide-magnesium carbonate (GAVISCON) 95-358 MG/15ML SUSP   Oral   Take 15 mLs by mouth every 6 (six) hours as needed (for gas).         Marland Kitchen aspirin EC 81 MG tablet   Oral   Take 81 mg by mouth every evening.         . calcium carbonate (OS-CAL) 600 MG TABS   Oral   Take 600 mg by mouth 2 (two) times daily with a meal.         . citalopram (CELEXA) 40 MG tablet   Oral   Take 40 mg by mouth every morning.          . clorazepate (TRANXENE) 7.5 MG tablet   Oral   Take 4 mg by mouth at bedtime as needed for anxiety or sleep. Anxiety         . levothyroxine (SYNTHROID,  LEVOTHROID) 25 MCG tablet   Oral   Take 25 mcg by mouth every morning.          . loratadine (CLARITIN) 10 MG tablet   Oral   Take 10 mg by mouth at bedtime.         . Multiple Vitamins-Minerals (WOMENS 50+ MULTI VITAMIN/MIN PO)   Oral   Take 1 tablet by mouth daily.         . traZODone (DESYREL) 50 MG tablet   Oral   Take 50 mg by mouth at bedtime.           BP 131/71  Pulse 63  Resp 16  SpO2 100%  Physical Exam 2040: Physical examination:  Nursing notes reviewed; Vital signs and O2 SAT reviewed;  Constitutional: Well developed, Well nourished, Well hydrated, In no acute distress; Head:  Normocephalic, atraumatic; Eyes: EOMI, PERRL, No scleral icterus; ENMT: Mouth and pharynx normal, Mucous membranes moist; Neck: Supple, Full range of motion, No lymphadenopathy; Cardiovascular: Regular rate and rhythm, No gallop; Respiratory: Breath sounds clear & equal bilaterally, No wheezes.  Speaking full sentences with ease, Normal respiratory effort/excursion; Chest: Nontender, Movement normal; Abdomen: Soft, +diffuse tenderness to palp, esp RUQ and LUQ. Nondistended, Normal bowel sounds. Rectal exam performed w/permission of pt and ED RN chaperone present.  Anal tone normal.  Non-tender, soft black stool in rectal vault, heme positive.  No fissures, no external hemorrhoids, no palp masses.;; Genitourinary: No CVA tenderness; Extremities: Pulses normal, No tenderness, No edema, No calf edema or asymmetry.; Neuro: AA&Ox3, Major CN grossly intact.  Speech clear. No gross focal motor or sensory deficits in extremities.; Skin: Color pale, Warm, Dry.   ED Course  Procedures    MDM  MDM Reviewed: previous chart, vitals and nursing note Reviewed previous: labs Interpretation: labs, ECG, x-ray and CT scan    Date: 08/30/2012  Rate: 62  Rhythm: normal sinus rhythm  QRS Axis: normal  Intervals: normal  ST/T Wave abnormalities: normal  Conduction Disutrbances:none  Narrative  Interpretation:   Old EKG Reviewed: none available.  Results for orders placed during the hospital encounter of 08/30/12  CBC WITH DIFFERENTIAL      Result Value Range   WBC 5.8  4.0 - 10.5 K/uL   RBC 3.37 (*) 3.87 - 5.11 MIL/uL   Hemoglobin 9.9 (*) 12.0 - 15.0 g/dL   HCT 16.1 (*) 09.6 - 04.5 %   MCV 90.5  78.0 - 100.0 fL   MCH 29.4  26.0 - 34.0 pg   MCHC 32.5  30.0 - 36.0 g/dL   RDW 40.9  11.5 - 15.5 %   Platelets 206  150 - 400 K/uL   Neutrophils Relative 28 (*) 43 - 77 %   Neutro Abs 1.6 (*) 1.7 - 7.7 K/uL   Lymphocytes Relative 55 (*) 12 - 46 %   Lymphs Abs 3.2  0.7 - 4.0 K/uL   Monocytes Relative 14 (*) 3 - 12 %   Monocytes Absolute 0.8  0.1 - 1.0 K/uL   Eosinophils Relative 4  0 - 5 %   Eosinophils Absolute 0.2  0.0 - 0.7 K/uL   Basophils Relative 0  0 - 1 %   Basophils Absolute 0.0  0.0 - 0.1 K/uL  COMPREHENSIVE METABOLIC PANEL      Result Value Range   Sodium 142  135 - 145 mEq/L   Potassium 3.9  3.5 - 5.1 mEq/L   Chloride 108  96 - 112 mEq/L   CO2 28  19 - 32 mEq/L   Glucose, Bld 82  70 - 99 mg/dL   BUN 9  6 - 23 mg/dL   Creatinine, Ser 1.61  0.50 - 1.10 mg/dL   Calcium 9.1  8.4 - 09.6 mg/dL   Total Protein 6.7  6.0 - 8.3 g/dL   Albumin 3.3 (*) 3.5 - 5.2 g/dL   AST 21  0 - 37 U/L   ALT 12  0 - 35 U/L   Alkaline Phosphatase 86  39 - 117 U/L   Total Bilirubin 0.1 (*) 0.3 - 1.2 mg/dL   GFR calc non Af Amer 68 (*) >90 mL/min   GFR calc Af Amer 79 (*) >90 mL/min  LIPASE, BLOOD      Result Value Range   Lipase 36  11 - 59 U/L  LACTIC ACID, PLASMA      Result Value Range   Lactic Acid, Venous 0.7  0.5 - 2.2 mmol/L  TROPONIN I      Result Value Range   Troponin I <0.30  <0.30 ng/mL  OCCULT BLOOD, POC DEVICE      Result Value Range   Fecal Occult Bld POSITIVE (*) NEGATIVE  SAMPLE TO BLOOD BANK      Result Value Range   Blood Bank Specimen SAMPLE AVAILABLE FOR TESTING     Sample Expiration 08/31/2012     Ct Abdomen Pelvis W Contrast 08/31/2012  *RADIOLOGY  REPORT*  Clinical Data: Abdominal pain with nausea, vomiting, and diarrhea. Dark stools.  Dark emesis.  CT ABDOMEN AND PELVIS WITH CONTRAST  Technique:  Multidetector CT imaging of the abdomen and pelvis was performed following the standard protocol during bolus administration of intravenous contrast.  Contrast: OMNIPAQUE IOHEXOL 300 MG/ML  SOLN  Comparison: CT scan dated 12/29/2010 and radiographs dated 08/30/2012  Findings: There is a moderate hiatal hernia.  The bowel is otherwise normal including the terminal ileum and appendix.  Cecum lies low in the right side of the pelvis as does the appendix. Uterus and ovaries and gallbladder have been removed.  Liver, spleen, pancreas, adrenal glands, bile ducts, and kidneys are normal except for two tiny cysts on the lower pole of the left kidney and a small stone in the lower pole of the left kidney.  IMPRESSION: No acute abnormalities.  Hiatal hernia.  Single stone in the lower pole of the left kidney.   Original Report Authenticated By: Francene Boyers, M.D.    Dg Abd Acute W/chest 08/30/2012  *RADIOLOGY REPORT*  Clinical Data: Generalized abdominal pain and vomiting.  ACUTE ABDOMEN SERIES (ABDOMEN 2  VIEW & CHEST 1 VIEW)  Comparison: Chest radiograph performed 06/02/2012, and CT of the chest, abdomen and pelvis performed 02/10/2011  Findings: The lungs are well-aerated.  Residual mild right apical and midlung airspace opacity may reflect scarring, or possibly mild recurrent infection.  This is noted in a similar distribution to the prior study.  There is no evidence of pleural effusion or pneumothorax.  The cardiomediastinal silhouette is within normal limits.  The visualized bowel gas pattern is unremarkable.  Scattered stool and air are seen within the colon; there is no evidence of small bowel dilatation to suggest obstruction.  No free intra-abdominal air is identified on the provided upright view.  No acute osseous abnormalities are seen; the sacroiliac  joints are unremarkable in appearance.  Cervical spinal fusion hardware is noted.  Clips are noted within the right upper quadrant, reflecting prior cholecystectomy.  IMPRESSION:  1.  Unremarkable bowel gas pattern; no free intra-abdominal air seen.  Moderate amount of stool noted in the colon. 2.  Residual mild right apical and midlung airspace opacity may reflect scarring, or possibly mild recurrent pneumonia.  This is seen in a similar distribution to the prior study from January.   Original Report Authenticated By: Tonia Ghent, M.D.     Results for EMREY, THORNLEY (MRN [id number]) as of 08/31/2012 00:51  Ref. Range 04/12/2009 19:17 03/24/2010 10:47 12/13/2011 17:40 06/02/2012 13:04 08/30/2012 20:38  Hemoglobin Latest Range: 12.0-15.0 g/dL 62.1 30.8 65.7 84.6 (A) 9.9 (L)  HCT Latest Range: 36.0-46.0 % 38.0 38.2 36.0 38.5 30.5 (L)     0045:  No hx of esophageal varices on previous EGD in 05/2012. No hx liver dysfunction. H/H lower than previous.  Likely gastritis; tx with IV zofran and protonix gtt.  Dx and testing d/w pt and family.  Questions answered.  Verb understanding, agreeable to admit. T/C to [facility] Dr. Conley Rolls, case discussed, including:  HPI, pertinent PM/SHx, VS/PE, dx testing, ED course and treatment:  Agreeable to admit, requests to write temporary orders, obtain tele bed to team [id number].            Laray Anger, DO 09/01/12 1614

## 2012-08-31 ENCOUNTER — Encounter (HOSPITAL_COMMUNITY): Payer: Self-pay | Admitting: Internal Medicine

## 2012-08-31 ENCOUNTER — Encounter (HOSPITAL_COMMUNITY)
Admission: EM | Disposition: A | Discharge status: Home / Self Care | Payer: Self-pay | Source: Home / Self Care | Attending: Internal Medicine

## 2012-08-31 DIAGNOSIS — F17200 Nicotine dependence, unspecified, uncomplicated: Secondary | ICD-10-CM

## 2012-08-31 DIAGNOSIS — J449 Chronic obstructive pulmonary disease, unspecified: Secondary | ICD-10-CM

## 2012-08-31 DIAGNOSIS — R0789 Other chest pain: Secondary | ICD-10-CM

## 2012-08-31 DIAGNOSIS — K922 Gastrointestinal hemorrhage, unspecified: Secondary | ICD-10-CM | POA: Diagnosis present

## 2012-08-31 DIAGNOSIS — K219 Gastro-esophageal reflux disease without esophagitis: Secondary | ICD-10-CM

## 2012-08-31 HISTORY — PX: ESOPHAGOGASTRODUODENOSCOPY: SHX5428

## 2012-08-31 LAB — CBC
HCT: 30.2 % — ABNORMAL LOW (ref 36.0–46.0)
HCT: 30.2 % — ABNORMAL LOW (ref 36.0–46.0)
HCT: 30.4 % — ABNORMAL LOW (ref 36.0–46.0)
HCT: 30.6 % — ABNORMAL LOW (ref 36.0–46.0)
Hemoglobin: 10 g/dL — ABNORMAL LOW (ref 12.0–15.0)
Hemoglobin: 10.2 g/dL — ABNORMAL LOW (ref 12.0–15.0)
Hemoglobin: 9.8 g/dL — ABNORMAL LOW (ref 12.0–15.0)
Hemoglobin: 9.8 g/dL — ABNORMAL LOW (ref 12.0–15.0)
MCH: 29.9 pg (ref 26.0–34.0)
MCH: 30.2 pg (ref 26.0–34.0)
MCH: 29.5 pg (ref 26.0–34.0)
MCH: 29.3 pg (ref 26.0–34.0)
MCHC: 33.1 g/dL (ref 30.0–36.0)
MCHC: 33.8 g/dL (ref 30.0–36.0)
MCHC: 32.2 g/dL (ref 30.0–36.0)
MCHC: 32 g/dL (ref 30.0–36.0)
MCV: 90.1 fL (ref 78.0–100.0)
MCV: 89.3 fL (ref 78.0–100.0)
MCV: 91.6 fL (ref 78.0–100.0)
MCV: 91.3 fL (ref 78.0–100.0)
Platelets: 182 10*3/uL (ref 150–400)
Platelets: 182 10*3/uL (ref 150–400)
Platelets: 183 10*3/uL (ref 150–400)
Platelets: 194 10*3/uL (ref 150–400)
RBC: 3.35 MIL/uL — ABNORMAL LOW (ref 3.87–5.11)
RBC: 3.38 MIL/uL — ABNORMAL LOW (ref 3.87–5.11)
RBC: 3.32 MIL/uL — ABNORMAL LOW (ref 3.87–5.11)
RBC: 3.35 MIL/uL — ABNORMAL LOW (ref 3.87–5.11)
RDW: 14 % (ref 11.5–15.5)
RDW: 14.1 % (ref 11.5–15.5)
RDW: 14.2 % (ref 11.5–15.5)
RDW: 14.3 % (ref 11.5–15.5)
WBC: 6.1 10*3/uL (ref 4.0–10.5)
WBC: 4.8 10*3/uL (ref 4.0–10.5)
WBC: 4.8 10*3/uL (ref 4.0–10.5)
WBC: 5.9 10*3/uL (ref 4.0–10.5)

## 2012-08-31 LAB — TSH: TSH: 4.83 u[IU]/mL — ABNORMAL HIGH (ref 0.350–4.500)

## 2012-08-31 SURGERY — EGD (ESOPHAGOGASTRODUODENOSCOPY)
Anesthesia: Moderate Sedation

## 2012-08-31 MED ORDER — LORATADINE 10 MG PO TABS
10.0000 mg | ORAL_TABLET | Freq: Every day | ORAL | Status: DC
  Administered 2012-08-31 – 2012-09-01 (×2): 10 mg via ORAL
  Filled 2012-08-31 (×4): qty 1

## 2012-08-31 MED ORDER — MIDAZOLAM HCL 5 MG/ML IJ SOLN
INTRAMUSCULAR | Status: AC
  Filled 2012-08-31: qty 3

## 2012-08-31 MED ORDER — FENTANYL CITRATE 0.05 MG/ML IJ SOLN
INTRAMUSCULAR | Status: AC
  Filled 2012-08-31: qty 4

## 2012-08-31 MED ORDER — FENTANYL CITRATE 0.05 MG/ML IJ SOLN
25.0000 ug | INTRAMUSCULAR | Status: DC | PRN
  Administered 2012-08-31 – 2012-09-01 (×4): 25 ug via INTRAVENOUS
  Filled 2012-08-31 (×5): qty 2

## 2012-08-31 MED ORDER — LEVOTHYROXINE SODIUM 25 MCG PO TABS
25.0000 ug | ORAL_TABLET | Freq: Every day | ORAL | Status: DC
  Administered 2012-08-31 – 2012-09-02 (×3): 25 ug via ORAL
  Filled 2012-08-31 (×5): qty 1

## 2012-08-31 MED ORDER — TRAZODONE HCL 50 MG PO TABS
50.0000 mg | ORAL_TABLET | Freq: Every day | ORAL | Status: DC
  Administered 2012-08-31 – 2012-09-01 (×2): 50 mg via ORAL
  Filled 2012-08-31 (×4): qty 1

## 2012-08-31 MED ORDER — IOHEXOL 300 MG/ML  SOLN
100.0000 mL | Freq: Once | INTRAMUSCULAR | Status: AC | PRN
  Administered 2012-08-31: 100 mL via INTRAVENOUS

## 2012-08-31 MED ORDER — ACETAMINOPHEN 325 MG PO TABS
650.0000 mg | ORAL_TABLET | Freq: Four times a day (QID) | ORAL | Status: DC | PRN
  Administered 2012-09-01 (×2): 650 mg via ORAL
  Filled 2012-08-31 (×2): qty 2

## 2012-08-31 MED ORDER — ALUM HYDROXIDE-MAG CARBONATE 95-358 MG/15ML PO SUSP: 15.0000 mL | Freq: Four times a day (QID) | ORAL | Status: DC | PRN

## 2012-08-31 MED ORDER — CITALOPRAM HYDROBROMIDE 40 MG PO TABS
40.0000 mg | ORAL_TABLET | Freq: Every morning | ORAL | Status: DC
Start: 2012-08-31 — End: 2012-09-02
  Administered 2012-08-31 – 2012-09-02 (×2): 40 mg via ORAL
  Filled 2012-08-31 (×3): qty 1

## 2012-08-31 MED ORDER — SODIUM CHLORIDE 0.9 % IJ SOLN
3.0000 mL | Freq: Two times a day (BID) | INTRAMUSCULAR | Status: DC
  Administered 2012-08-31: 3 mL via INTRAVENOUS

## 2012-08-31 MED ORDER — SUCRALFATE 1 GM/10ML PO SUSP
1.0000 g | Freq: Three times a day (TID) | ORAL | Status: DC
  Administered 2012-08-31 – 2012-09-02 (×3): 1 g via ORAL
  Filled 2012-08-31 (×12): qty 10

## 2012-08-31 MED ORDER — CALCIUM CARBONATE 1250 (500 CA) MG PO TABS
1.0000 | ORAL_TABLET | Freq: Two times a day (BID) | ORAL | Status: DC
  Administered 2012-08-31 – 2012-09-02 (×4): 500 mg via ORAL
  Filled 2012-08-31 (×9): qty 1

## 2012-08-31 MED ORDER — CHLORHEXIDINE GLUCONATE 0.12 % MT SOLN
15.0000 mL | Freq: Two times a day (BID) | OROMUCOSAL | Status: DC
  Administered 2012-08-31 – 2012-09-02 (×5): 15 mL via OROMUCOSAL
  Filled 2012-08-31 (×8): qty 15

## 2012-08-31 MED ORDER — KCL IN DEXTROSE-NACL 20-5-0.9 MEQ/L-%-% IV SOLN
INTRAVENOUS | Status: DC
  Administered 2012-08-31: 03:00:00 via INTRAVENOUS
  Administered 2012-08-31: 100 mL/h via INTRAVENOUS
  Administered 2012-08-31 – 2012-09-01 (×4): via INTRAVENOUS
  Filled 2012-08-31 (×11): qty 1000

## 2012-08-31 MED ORDER — CLORAZEPATE DIPOTASSIUM 3.75 MG PO TABS: 4.0000 mg | ORAL_TABLET | Freq: Every evening | ORAL | Status: DC | PRN

## 2012-08-31 MED ORDER — ONDANSETRON HCL 4 MG/2ML IJ SOLN: 4.0000 mg | Freq: Three times a day (TID) | INTRAMUSCULAR | Status: AC | PRN

## 2012-08-31 MED ORDER — CALCIUM CARBONATE 600 MG PO TABS: 600.0000 mg | ORAL_TABLET | Freq: Two times a day (BID) | ORAL | Status: DC

## 2012-08-31 MED ORDER — BIOTENE DRY MOUTH MT LIQD
15.0000 mL | Freq: Two times a day (BID) | OROMUCOSAL | Status: DC
Start: 2012-08-31 — End: 2012-09-02
  Administered 2012-08-31 – 2012-09-01 (×2): 15 mL via OROMUCOSAL

## 2012-08-31 MED ORDER — DIPHENHYDRAMINE HCL 50 MG/ML IJ SOLN
INTRAMUSCULAR | Status: AC
  Filled 2012-08-31: qty 1

## 2012-08-31 MED ORDER — ONDANSETRON HCL 4 MG/2ML IJ SOLN
INTRAMUSCULAR | Status: DC | PRN
  Administered 2012-08-31: 4 mg via INTRAVENOUS

## 2012-08-31 MED ORDER — ONDANSETRON HCL 4 MG/2ML IJ SOLN
INTRAMUSCULAR | Status: AC
  Filled 2012-08-31: qty 2

## 2012-08-31 MED ORDER — SODIUM CHLORIDE 0.9 % IV SOLN: INTRAVENOUS | Status: DC

## 2012-08-31 MED ORDER — MIDAZOLAM HCL 10 MG/2ML IJ SOLN
INTRAMUSCULAR | Status: DC | PRN
  Administered 2012-08-31 (×2): 2.5 mg via INTRAVENOUS

## 2012-08-31 MED ORDER — ALUM & MAG HYDROXIDE-SIMETH 200-200-20 MG/5ML PO SUSP: 15.0000 mL | Freq: Four times a day (QID) | ORAL | Status: DC | PRN

## 2012-08-31 MED ORDER — FENTANYL CITRATE 0.05 MG/ML IJ SOLN
INTRAMUSCULAR | Status: DC | PRN
  Administered 2012-08-31 (×2): 25 ug via INTRAVENOUS

## 2012-08-31 MED ORDER — BUTAMBEN-TETRACAINE-BENZOCAINE 2-2-14 % EX AERO
INHALATION_SPRAY | CUTANEOUS | Status: DC | PRN
  Administered 2012-08-31: 2 via TOPICAL

## 2012-08-31 NOTE — H&P (Signed)
[facility] History and Physical  Brittany [name] WUJ:[id number] DOB: 12-25-1950    PCP:   Carmin Richmond, MD   Chief Complaint: Black stool.  HPI: Brittany [name] is an 62 y.o. female with history of GERD, status post capsule enteroscopy with pH monitoring, (result unavailable at this time in Epic), hypothyroidism, hyperlipidemia, anxiety, history of anemia status post hysterectomy and cholecystectomy, presents to the emergency room complaining of abdominal discomfort and black stool. She is currently not on any iron supplement. She denied any recent use of Pepto-Bismol. She denied any lightheadedness, chest pain, shortness of breath. She also has.vomitus. There has been no hematochezia nor hematemesis. Evaluation in emergency room included a hemoglobin of 10.9 g per decaliter (about 1 g per decaliter lower than in January 2040) she is guaiac positive, she has normal renal function tests and BUN was only 9. Hospitalist was asked to admit her for possible upper GI bleed. She remained hemodynamically stable. Her gastroenterologist was Dr. Charlott Rakes  Rewiew of Systems:  Constitutional: Negative for malaise, fever and chills. No significant weight loss or weight gain Eyes: Negative for eye pain, redness and discharge, diplopia, visual changes, or flashes of light. ENMT: Negative for ear pain, hoarseness, nasal congestion, sinus pressure and sore throat. No headaches; tinnitus, drooling, or problem swallowing. Cardiovascular: Negative for chest pain, palpitations, diaphoresis, dyspnea and peripheral edema. ; No orthopnea, PND Respiratory: Negative for cough, hemoptysis, wheezing and stridor. No pleuritic chestpain. Gastrointestinal: Negative for nausea, vomiting, diarrhea, constipation, abdominal pain, melena Genitourinary: Negative for frequency, dysuria, incontinence,flank pain and hematuria; Musculoskeletal: Negative for back pain and neck pain. Negative for swelling and trauma.;  Skin:  . Negative for pruritus, rash, abrasions, bruising and skin lesion.; ulcerations Neuro: Negative for headache, lightheadedness and neck stiffness. Negative for weakness, altered level of consciousness , altered mental status, extremity weakness, burning feet, involuntary movement, seizure and syncope.  Psych: negative for anxiety, depression, insomnia, tearfulness, panic attacks, hallucinations, paranoia, suicidal or homicidal ideation    Past Medical History  Diagnosis Date  . Hypercholesteremia   . Complication of anesthesia   . PONV (postoperative nausea and vomiting)   . Hypothyroidism   . Depression   . GERD (gastroesophageal reflux disease)   . H/O hiatal hernia   . Anemia   . Cataract   . Anxiety   . Osteoporosis     Past Surgical History  Procedure Laterality Date  . Hip surgery  1970s    "hip sunk in"  . Tubal ligation  1981  . Tmj arthroplasty  1992  . Abdominal hysterectomy  2002    total  . Cholecystectomy  2006  . Neck surgery  2012  . Thyroid surgery      nodule removed, right side  . Rotator cuff repair  2013 november    right side  . Esophagogastroduodenoscopy  05/26/2012    Procedure: ESOPHAGOGASTRODUODENOSCOPY (EGD);  Surgeon: Shirley Friar, MD;  Location: [facility];  Service: Endoscopy;  Laterality: N/A;  . Bravo ph study  05/26/2012    Procedure: BRAVO PH STUDY;  Surgeon: Shirley Friar, MD;  Location: [facility];  Service: Endoscopy;  Laterality: N/A;  . Esophagogastroduodenoscopy  05/30/2012    Procedure: ESOPHAGOGASTRODUODENOSCOPY (EGD);  Surgeon: Florencia Reasons, MD;  Location: [facility];  Service: Endoscopy;  Laterality: N/A;  . Eye surgery      Medications:  HOME MEDS: Prior to Admission medications   Medication Sig Start Date End Date Taking? Authorizing Provider  aluminum hydroxide-magnesium  carbonate (GAVISCON) 95-358 MG/15ML SUSP Take 15 mLs by mouth every 6 (six) hours as needed (for gas).   Yes Historical Provider, MD   aspirin EC 81 MG tablet Take 81 mg by mouth every evening.   Yes Historical Provider, MD  calcium carbonate (OS-CAL) 600 MG TABS Take 600 mg by mouth 2 (two) times daily with a meal.   Yes Historical Provider, MD  citalopram (CELEXA) 40 MG tablet Take 40 mg by mouth every morning.    Yes Historical Provider, MD  clorazepate (TRANXENE) 7.5 MG tablet Take 4 mg by mouth at bedtime as needed for anxiety or sleep. Anxiety   Yes Historical Provider, MD  levothyroxine (SYNTHROID, LEVOTHROID) 25 MCG tablet Take 25 mcg by mouth every morning.    Yes Historical Provider, MD  loratadine (CLARITIN) 10 MG tablet Take 10 mg by mouth at bedtime.   Yes Historical Provider, MD  Multiple Vitamins-Minerals (WOMENS 50+ MULTI VITAMIN/MIN PO) Take 1 tablet by mouth daily.   Yes Historical Provider, MD  traZODone (DESYREL) 50 MG tablet Take 50 mg by mouth at bedtime.   Yes Historical Provider, MD     Allergies:  Allergies  Allergen Reactions  . Lithium Anaphylaxis  . Morphine Nausea And Vomiting  . Pseudoephedrine Hypertension    Social History:   reports that she quit smoking about 4 years ago. She does not have any smokeless tobacco history on file. She reports that she does not drink alcohol or use illicit drugs.  Family History: Family History  Problem Relation Age of Onset  . Hypertension Mother   . Kidney disease Mother   . Cancer Sister      Physical Exam: Filed Vitals:   08/30/12 2300 08/30/12 2315 08/31/12 0130 08/31/12 0211  BP: 134/71 146/63 125/99 130/79  Pulse: 60 61 75 69  Temp:    98.3 F (36.8 C)  TempSrc:    Oral  Resp: 14 19  20   Height:    5\' 6"  (1.676 m)  Weight:    58.6 kg (129 lb 3 oz)  SpO2: 100% 100% 100% 96%   Blood pressure 130/79, pulse 69, temperature 98.3 F (36.8 C), temperature source Oral, resp. rate 20, height 5\' 6"  (1.676 m), weight 58.6 kg (129 lb 3 oz), SpO2 96.00%.  GEN:  Pleasant  patient lying in the stretcher in no acute distress; cooperative with  exam. PSYCH:  alert and oriented x4; does not appear anxious or depressed; affect is appropriate. HEENT: Mucous membranes pink and anicteric; PERRLA; EOM intact; no cervical lymphadenopathy nor thyromegaly or carotid bruit; no JVD; There were no stridor. Neck is very supple. Breasts:: Not examined CHEST WALL: No tenderness CHEST: Normal respiration, clear to auscultation bilaterally.  HEART: Regular rate and rhythm.  There are no murmur, rub, or gallops.   BACK: No kyphosis or scoliosis; no CVA tenderness ABDOMEN: soft and non-tender; no masses, no organomegaly, normal abdominal bowel sounds; no pannus; no intertriginous candida. There is no rebound and no distention. Rectal Exam: EDP he stated it was guaiac positive melanotic stool EXTREMITIES: No bone or joint deformity; age-appropriate arthropathy of the hands and knees; no edema; no ulcerations.  There is no calf tenderness. Genitalia: not examined PULSES: 2+ and symmetric SKIN: Normal hydration no rash or ulceration CNS: Cranial nerves 2-12 grossly intact no focal lateralizing neurologic deficit.  Speech is fluent; uvula elevated with phonation, facial symmetry and tongue midline. DTR are normal bilaterally, cerebella exam is intact, barbinski is negative and strengths are  equaled bilaterally.  No sensory loss.   Labs on Admission:  Basic Metabolic Panel:  Recent Labs Lab 08/30/12 2038  NA 142  K 3.9  CL 108  CO2 28  GLUCOSE 82  BUN 9  CREATININE 0.90  CALCIUM 9.1   Liver Function Tests:  Recent Labs Lab 08/30/12 2038  AST 21  ALT 12  ALKPHOS 86  BILITOT 0.1*  PROT 6.7  ALBUMIN 3.3*    Recent Labs Lab 08/30/12 2038  LIPASE 36   No results found for this basename: AMMONIA,  in the last 168 hours CBC:  Recent Labs Lab 08/30/12 2038 08/31/12 0315  WBC 5.8 6.1  NEUTROABS 1.6*  --   HGB 9.9* 10.0*  HCT 30.5* 30.2*  MCV 90.5 90.1  PLT 206 182   Cardiac Enzymes:  Recent Labs Lab 08/30/12 2028   TROPONINI <0.30    CBG: No results found for this basename: GLUCAP,  in the last 168 hours   Radiological Exams on Admission: Ct Abdomen Pelvis W Contrast  08/31/2012  *RADIOLOGY REPORT*  Clinical Data: Abdominal pain with nausea, vomiting, and diarrhea. Dark stools.  Dark emesis.  CT ABDOMEN AND PELVIS WITH CONTRAST  Technique:  Multidetector CT imaging of the abdomen and pelvis was performed following the standard protocol during bolus administration of intravenous contrast.  Contrast: OMNIPAQUE IOHEXOL 300 MG/ML  SOLN  Comparison: CT scan dated 12/29/2010 and radiographs dated 08/30/2012  Findings: There is a moderate hiatal hernia.  The bowel is otherwise normal including the terminal ileum and appendix.  Cecum lies low in the right side of the pelvis as does the appendix. Uterus and ovaries and gallbladder have been removed.  Liver, spleen, pancreas, adrenal glands, bile ducts, and kidneys are normal except for two tiny cysts on the lower pole of the left kidney and a small stone in the lower pole of the left kidney.  IMPRESSION: No acute abnormalities.  Hiatal hernia.  Single stone in the lower pole of the left kidney.   Original Report Authenticated By: Francene Boyers, M.D.    Dg Abd Acute W/chest  08/30/2012  *RADIOLOGY REPORT*  Clinical Data: Generalized abdominal pain and vomiting.  ACUTE ABDOMEN SERIES (ABDOMEN 2 VIEW & CHEST 1 VIEW)  Comparison: Chest radiograph performed 06/02/2012, and CT of the chest, abdomen and pelvis performed 02/10/2011  Findings: The lungs are well-aerated.  Residual mild right apical and midlung airspace opacity may reflect scarring, or possibly mild recurrent infection.  This is noted in a similar distribution to the prior study.  There is no evidence of pleural effusion or pneumothorax.  The cardiomediastinal silhouette is within normal limits.  The visualized bowel gas pattern is unremarkable.  Scattered stool and air are seen within the colon; there is no  evidence of small bowel dilatation to suggest obstruction.  No free intra-abdominal air is identified on the provided upright view.  No acute osseous abnormalities are seen; the sacroiliac joints are unremarkable in appearance.  Cervical spinal fusion hardware is noted.  Clips are noted within the right upper quadrant, reflecting prior cholecystectomy.  IMPRESSION:  1.  Unremarkable bowel gas pattern; no free intra-abdominal air seen.  Moderate amount of stool noted in the colon. 2.  Residual mild right apical and midlung airspace opacity may reflect scarring, or possibly mild recurrent pneumonia.  This is seen in a similar distribution to the prior study from January.   Original Report Authenticated By: Tonia Ghent, M.D.     Assessment/Plan Present on  Admission:  Marland Kitchen G E REFLUX . TOBACCO ABUSE . Upper GI bleed . Chronic airway obstruction, not elsewhere classified  PLAN:  I unfortunately was not able to obtain the result of the EGD in January 2014. I suspect she does have a slow upper GI bleed. She is quite asymptomatic at this time. There has been hemodynamic stability. We'll follow her crit every 6 hours with type and screen will hold off on transfusion. I will make her n.p.o. in case she needs upper endoscopy again. Please consult Dr. Oswaldo Done schooler in the morning. She was given intravenous PPI drip and I will continue. I recommended she stop smoking. She is stable, full code, and will be admitted to telemetry under [facility].  Other plans as per orders.  Code Status: Full Code.   Houston Siren, MD. [facility] Pager [phone] 7pm to 7am.  08/31/2012, 4:23 AM

## 2012-08-31 NOTE — Progress Notes (Signed)
Addendum  Patient seen and examined, chart and data base reviewed.  I agree with the above assessment and plan.  For full details please see Mrs. Algis Downs PA note.  EGD showed hiatal hernia and erythematous mucosa, GI recommend to continue PPI.  No evidence of bleeding.   Clint Lipps, MD [facility] Pager: [phone] 08/31/2012, 12:54 PM

## 2012-08-31 NOTE — Progress Notes (Signed)
[facility] PROGRESS NOTE  Brittany [name] HQI:[id number] DOB: [birth year] DOA: 08/30/2012 PCP: Carmin Richmond, MD  Assessment/Plan: 1. Severe GERD with coffee ground emesis -History of same with on-going work up by [facility] -unknown etiology  -Started on protonix drip at admission.  Given supportive txt with zofran, IVF. -Consulted [facility] - we appreciate Dr. Luan Moore evaluation -awaiting endoscopy today  2. Abdominal pain - Uncertain etiology -significantly tender to palpation in central abdomen and epigastic area -CT abdomen pelvis negative for acute abnormalities.  Moderate hiatal hernia.  3. Hypothyroidism -Continue levothyroxine   4. Anxiety -stable -Continue citalopram -Continue clorazepate  -Continue trazodone   5. Normocytic Anemia with possible acute blood loss anemia. -Guiac positive in the ED - baseline Hgb appears to be 11-12.  Currently 10.0 -Serial hemoglobin labs   Code Status: Full Family Communication: None Disposition Plan: Inpatient   Consultants:  [facility]  Procedures:  Endoscopy scheduled for 11am   Antibiotics:    HPI/Subjective: No complaints of pain. Patient is sitting in chair at bedside. No recent vomiting or BMs. Patient is NPO and scheduled for endoscopy.   Objective: Filed Vitals:   08/30/12 2315 08/31/12 0130 08/31/12 0211 08/31/12 0537  BP: 146/63 125/99 130/79 112/67  Pulse: 61 75 69 71  Temp:   98.3 F (36.8 C) 97.9 F (36.6 C)  TempSrc:   Oral Oral  Resp: 19  20 18   Height:   5\' 6"  (1.676 m)   Weight:   58.6 kg (129 lb 3 oz)   SpO2: 100% 100% 96% 97%    Intake/Output Summary (Last 24 hours) at 08/31/12 0835 Last data filed at 08/31/12 0602  Gross per 24 hour  Intake 495.41 ml  Output      0 ml  Net 495.41 ml   Filed Weights   08/31/12 0211  Weight: 58.6 kg (129 lb 3 oz)    Exam:   General: WDWN female, resting comfortably seated in chair, no apparent distress  HEENT: PERRLA,  sclera/conjunctiva clear, mucous membranes moist,  Neck: supple, no JVD, no cervical lymphadenopathy   Cardiovascular: RRR, no murmurs/rubs/gallops,   Respiratory: clear to auscultation anterior and posterior fields bilaterally, no rales/rhonchi/wheezes   Abdomen: soft non-distended, tender to deep palpation in RUQ and epigastric areas, normal BS, no ascites appreciated, no guarding, no rigidity  Musculoskeletal: moves all extremities spontaneously, no lower leg edema, distal pulses present and even bilaterally  Neurological: A&O, CN 2-12 grossly intact, cerebella exam intact, normal affect   Data Reviewed: Basic Metabolic Panel:  Recent Labs Lab 08/30/12 2038  NA 142  K 3.9  CL 108  CO2 28  GLUCOSE 82  BUN 9  CREATININE 0.90  CALCIUM 9.1   Liver Function Tests:  Recent Labs Lab 08/30/12 2038  AST 21  ALT 12  ALKPHOS 86  BILITOT 0.1*  PROT 6.7  ALBUMIN 3.3*    Recent Labs Lab 08/30/12 2038  LIPASE 36   CBC:  Recent Labs Lab 08/30/12 2038 08/31/12 0315 08/31/12 0755  WBC 5.8 6.1 4.8  NEUTROABS 1.6*  --   --   HGB 9.9* 10.0* 10.2*  HCT 30.5* 30.2* 30.2*  MCV 90.5 90.1 89.3  PLT 206 182 182   Cardiac Enzymes:  Recent Labs Lab 08/30/12 2028  TROPONINI <0.30    Studies: Ct Abdomen Pelvis W Contrast  08/31/2012  *RADIOLOGY REPORT*  Clinical Data: Abdominal pain with nausea, vomiting, and diarrhea. Dark stools.  Dark emesis.  CT ABDOMEN AND PELVIS WITH  CONTRAST  Technique:  Multidetector CT imaging of the abdomen and pelvis was performed following the standard protocol during bolus administration of intravenous contrast.  Contrast: OMNIPAQUE IOHEXOL 300 MG/ML  SOLN  Comparison: CT scan dated 12/29/2010 and radiographs dated 08/30/2012  Findings: There is a moderate hiatal hernia.  The bowel is otherwise normal including the terminal ileum and appendix.  Cecum lies low in the right side of the pelvis as does the appendix. Uterus and ovaries and  gallbladder have been removed.  Liver, spleen, pancreas, adrenal glands, bile ducts, and kidneys are normal except for two tiny cysts on the lower pole of the left kidney and a small stone in the lower pole of the left kidney.  IMPRESSION: No acute abnormalities.  Hiatal hernia.  Single stone in the lower pole of the left kidney.   Original Report Authenticated By: Francene Boyers, M.D.    Dg Abd Acute W/chest  08/30/2012  *RADIOLOGY REPORT*  Clinical Data: Generalized abdominal pain and vomiting.  ACUTE ABDOMEN SERIES (ABDOMEN 2 VIEW & CHEST 1 VIEW)  Comparison: Chest radiograph performed 06/02/2012, and CT of the chest, abdomen and pelvis performed 02/10/2011  Findings: The lungs are well-aerated.  Residual mild right apical and midlung airspace opacity may reflect scarring, or possibly mild recurrent infection.  This is noted in a similar distribution to the prior study.  There is no evidence of pleural effusion or pneumothorax.  The cardiomediastinal silhouette is within normal limits.  The visualized bowel gas pattern is unremarkable.  Scattered stool and air are seen within the colon; there is no evidence of small bowel dilatation to suggest obstruction.  No free intra-abdominal air is identified on the provided upright view.  No acute osseous abnormalities are seen; the sacroiliac joints are unremarkable in appearance.  Cervical spinal fusion hardware is noted.  Clips are noted within the right upper quadrant, reflecting prior cholecystectomy.  IMPRESSION:  1.  Unremarkable bowel gas pattern; no free intra-abdominal air seen.  Moderate amount of stool noted in the colon. 2.  Residual mild right apical and midlung airspace opacity may reflect scarring, or possibly mild recurrent pneumonia.  This is seen in a similar distribution to the prior study from January.   Original Report Authenticated By: Tonia Ghent, M.D.     Scheduled Meds: . antiseptic oral rinse  15 mL Mouth Rinse q12n4p  . calcium  carbonate  1 tablet Oral BID WC  . chlorhexidine  15 mL Mouth Rinse BID  . citalopram  40 mg Oral q morning - 10a  . levothyroxine  25 mcg Oral QAC breakfast  . loratadine  10 mg Oral QHS  . sodium chloride  3 mL Intravenous Q12H  . traZODone  50 mg Oral QHS   Continuous Infusions: . dextrose 5 % and 0.9 % NaCl with KCl 20 mEq/L 100 mL/hr at 08/31/12 0242  . pantoprozole (PROTONIX) infusion 8 mg/hr (08/30/12 2333)    Active Problems:   TOBACCO ABUSE   Chronic airway obstruction, not elsewhere classified   G E REFLUX   Upper GI bleed    Rudolpho Sevin PA-S [facility]  If 7PM-7AM, please contact night-coverage at [email or web address], password [email or web address]  Algis Downs, [place] [facility] Pager: [phone]  08/31/2012, 8:35 AM  LOS: 1 day

## 2012-08-31 NOTE — Progress Notes (Signed)
Patient admitted to 5529 from ED. Patient lives at home with son. Patient has bruise on rt hip from fall last Wednesday.  Patient states that her left foot is numb and has been numb for over a month.  Patient is A&Ox3.  Placed patient on tele running NSR.  Patient oriented to unit and room.  Will continue to monitor patient. Nelda Marseille, RN

## 2012-08-31 NOTE — Consult Note (Addendum)
Subjective:   HPI  The patient is a 62 year old [name] who we are asked to see in regards to melena and coffee-ground emesis. She has a long history of heartburn and apparent gastroesophageal reflux. She complains that for years she has had problems with waterbrash type symptoms and heartburn. Normally what she brings up she describes as a yellowish bile but yesterday started bringing up coffee-ground-appearing material and having melena.  She has a history of gastroparesis. In January of this year she had a EGD which was normal and a bravo capsule was placed. The reading of the bravo capsule was normal. She had an EGD done 4 days after her bravo capsule was placed because she felt there was a foreign body in her esophagus ,  thought was chicken but the endoscopy showed only a small hiatal hernia and the bravo capsule was still in place. There was no obstruction. She saw a gastroenterologist at [facility] 8 days ago for further evaluation. No tests were done. She had a colonoscopy in 2012 which showed diverticulosis.  Review of Systems Denies chest pain or shortness of breath  Past Medical History  Diagnosis Date  . Hypercholesteremia   . Complication of anesthesia   . PONV (postoperative nausea and vomiting)   . Hypothyroidism   . Depression   . GERD (gastroesophageal reflux disease)   . H/O hiatal hernia   . Anemia   . Cataract   . Anxiety   . Osteoporosis    Past Surgical History  Procedure Laterality Date  . Hip surgery  1970s    "hip sunk in"  . Tubal ligation  1981  . Tmj arthroplasty  1992  . Abdominal hysterectomy  2002    total  . Cholecystectomy  2006  . Neck surgery  2012  . Thyroid surgery      nodule removed, right side  . Rotator cuff repair  2013 november    right side  . Esophagogastroduodenoscopy  05/26/2012    Procedure: ESOPHAGOGASTRODUODENOSCOPY (EGD);  Surgeon: Shirley Friar, MD;  Location: [facility];  Service: Endoscopy;  Laterality: N/A;  . Bravo  ph study  05/26/2012    Procedure: BRAVO PH STUDY;  Surgeon: Shirley Friar, MD;  Location: [facility];  Service: Endoscopy;  Laterality: N/A;  . Esophagogastroduodenoscopy  05/30/2012    Procedure: ESOPHAGOGASTRODUODENOSCOPY (EGD);  Surgeon: Florencia Reasons, MD;  Location: [facility];  Service: Endoscopy;  Laterality: N/A;  . Eye surgery     History   Social History  . Marital Status: Divorced    Spouse Name: N/A    Number of Children: N/A  . Years of Education: N/A   Occupational History  . Not on file.   Social History Main Topics  . Smoking status: Former Smoker    Quit date: 02/05/2008  . Smokeless tobacco: Not on file  . Alcohol Use: No  . Drug Use: No  . Sexually Active: Not on file   Other Topics Concern  . Not on file   Social History Narrative  . No narrative on file   family history includes Cancer in her sister; Hypertension in her mother; and Kidney disease in her mother. Current facility-administered medications:alum & mag hydroxide-simeth (MAALOX/MYLANTA) 200-200-20 MG/5ML suspension 15 mL, 15 mL, Oral, Q6H PRN, Houston Siren, MD;  antiseptic oral rinse (BIOTENE) solution 15 mL, 15 mL, Mouth Rinse, q12n4p, Houston Siren, MD;  calcium carbonate (OS-CAL - dosed in mg of elemental calcium) tablet 500 mg of elemental calcium, 1  tablet, Oral, BID WC, Houston Siren, MD, 500 mg of elemental calcium at 08/31/12 0752 chlorhexidine (PERIDEX) 0.Brittany % solution 15 mL, 15 mL, Mouth Rinse, BID, Houston Siren, MD, 15 mL at 08/31/12 2130;  citalopram (CELEXA) tablet 40 mg, 40 mg, Oral, q morning - 10a, Houston Siren, MD;  clorazepate (TRANXENE) tablet 3.75 mg, 3.75 mg, Oral, QHS PRN, Houston Siren, MD;  dextrose 5 % and 0.9 % NaCl with KCl 20 mEq/L infusion, , Intravenous, Continuous, Houston Siren, MD, Last Rate: 100 mL/hr at 08/31/12 0242 fentaNYL (SUBLIMAZE) injection 25 mcg, 25 mcg, Intravenous, Q2H PRN, Houston Siren, MD, 25 mcg at 08/31/12 8657;  levothyroxine (SYNTHROID, LEVOTHROID) tablet 25 mcg, 25 mcg, Oral,  QAC breakfast, Houston Siren, MD, 25 mcg at 08/31/12 8469;  loratadine (CLARITIN) tablet 10 mg, 10 mg, Oral, QHS, Houston Siren, MD;  ondansetron [facility]) injection 4 mg, 4 mg, Intravenous, Q1H PRN, Laray Anger, DO ondansetron [facility]) injection 4 mg, 4 mg, Intravenous, Q8H PRN, Laray Anger, DO;  pantoprazole (PROTONIX) 80 mg in sodium chloride 0.9 % 250 mL infusion, 8 mg/hr, Intravenous, Continuous, Laray Anger, DO, Last Rate: 25 mL/hr at 08/30/12 2333, 8 mg/hr at 08/30/12 2333;  sodium chloride 0.9 % injection 3 mL, 3 mL, Intravenous, Q12H, Houston Siren, MD;  traZODone (DESYREL) tablet 50 mg, 50 mg, Oral, QHS, Houston Siren, MD Allergies  Allergen Reactions  . Lithium Anaphylaxis  . Morphine Nausea And Vomiting  . Pseudoephedrine Hypertension     Objective:     BP 112/67  Pulse 71  Temp(Src) 97.9 F (36.6 C) (Oral)  Resp 18  Ht 5\' 6"  (1.676 m)  Wt 58.6 kg (129 lb 3 oz)  BMI 20.86 kg/m2  SpO2 97%  She is in no distress  Heart regular rhythm no murmurs  Lungs clear  Abdomen: Soft, mild epigastric tenderness  No hepatosplenomegaly  Laboratory No components found with this basename: d1      Assessment:     #1. Coffee-ground emesis and melena  #2. Long history of gastroesophageal reflux  #3. History of gastroparesis      Plan:     We will proceed with EGD to evaluate the upper GI tract at this time in view of the coffee-ground emesis and melena. Recommend PPI therapy. Lab Results  Component Value Date   HGB 10.2* 08/31/2012   HGB 10.0* 08/31/2012   HGB 9.9* 08/30/2012   HGB 11.4* 06/02/2012   HCT 30.2* 08/31/2012   HCT 30.2* 08/31/2012   HCT 30.5* 08/30/2012   HCT 38.5 06/02/2012   ALKPHOS 86 08/30/2012   ALKPHOS 78 10/21/2009   ALKPHOS 79 04/12/2009   AST 21 08/30/2012   AST 22 10/21/2009   AST 36 04/12/2009   ALT Brittany 08/30/2012   ALT 22 10/21/2009   ALT 11 04/12/2009

## 2012-08-31 NOTE — Op Note (Signed)
[facility] [address] [place], [address]   ENDOSCOPY PROCEDURE REPORT  PATIENT: Brittany [name], Brittany [name]  MR#: [id number] BIRTHDATE: 1950-11-03 , 61  yrs. old GENDER: Female ENDOSCOPIST: Wandalee Ferdinand, MD REFERRED BY: PROCEDURE DATE:  08/31/2012 PROCEDURE:   EGD ASA CLASS: 3 INDICATIONS: coffee-ground emesis and melena MEDICATIONS: fentanyl 50 mcg IV, Versed 5 mg IV, Zofran 4 mg IV  TOPICAL ANESTHETIC:  DESCRIPTION OF PROCEDURE:   After the risks benefits and alternatives of the procedure were thoroughly explained, informed consent was obtained.  The EG-[id number] ([id number])  endoscope was introduced through the mouth and advanced to the second portion of the duodenum      , limited by Without limitations.   The instrument was slowly withdrawn as the mucosa was fully examined.      FINDINGS:  Esophagus: Normal  Stomach: Hiatal hernia, erythematous mucosa in the antrum. No evidence of ulcer or active bleeding.  Duodenum: Normal  COMPLICATIONS:none  ENDOSCOPIC IMPRESSION:see above   RECOMMENDATIONS:I would recommend continuing PPI therapy at this time. I don't see any evidence of active bleeding. Monitor for further signs of bleeding or drop in H&H.   REPEAT EXAM: when necessary   _______________________________ Rhodia Albright, MD 08/31/2012 11:31 AM       PATIENT NAME:  [name] MR#: [id number]

## 2012-09-01 ENCOUNTER — Encounter (HOSPITAL_COMMUNITY): Payer: Self-pay | Admitting: Gastroenterology

## 2012-09-01 ENCOUNTER — Observation Stay (HOSPITAL_COMMUNITY): Payer: Medicare Other

## 2012-09-01 DIAGNOSIS — R531 Weakness: Secondary | ICD-10-CM

## 2012-09-01 LAB — CBC
HCT: 33.2 % — ABNORMAL LOW (ref 36.0–46.0)
Hemoglobin: 10.9 g/dL — ABNORMAL LOW (ref 12.0–15.0)
MCH: 29.5 pg (ref 26.0–34.0)
MCHC: 32.8 g/dL (ref 30.0–36.0)
MCV: 90 fL (ref 78.0–100.0)
Platelets: 206 10*3/uL (ref 150–400)
RBC: 3.69 MIL/uL — ABNORMAL LOW (ref 3.87–5.11)
RDW: 13.9 % (ref 11.5–15.5)
WBC: 6.3 10*3/uL (ref 4.0–10.5)

## 2012-09-01 LAB — GLUCOSE, CAPILLARY
Glucose-Capillary: 108 mg/dL — ABNORMAL HIGH (ref 70–99)
Glucose-Capillary: 104 mg/dL — ABNORMAL HIGH (ref 70–99)

## 2012-09-01 LAB — BASIC METABOLIC PANEL
BUN: 4 mg/dL — ABNORMAL LOW (ref 6–23)
CO2: 23 mEq/L (ref 19–32)
Calcium: 9.2 mg/dL (ref 8.4–10.5)
Chloride: 110 mEq/L (ref 96–112)
Creatinine, Ser: 0.83 mg/dL (ref 0.50–1.10)
GFR calc Af Amer: 87 mL/min — ABNORMAL LOW (ref >90)
GFR calc non Af Amer: 75 mL/min — ABNORMAL LOW (ref >90)
Glucose, Bld: 105 mg/dL — ABNORMAL HIGH (ref 70–99)
Potassium: 4 mEq/L (ref 3.5–5.1)
Sodium: 143 mEq/L (ref 135–145)

## 2012-09-01 LAB — LACTIC ACID, PLASMA: Lactic Acid, Venous: 3.5 mmol/L — ABNORMAL HIGH (ref 0.5–2.2)

## 2012-09-01 MED ORDER — ONDANSETRON HCL 4 MG/2ML IJ SOLN: 4.0000 mg | Freq: Four times a day (QID) | INTRAMUSCULAR | Status: DC | PRN

## 2012-09-01 MED ORDER — PANTOPRAZOLE SODIUM 40 MG IV SOLR
40.0000 mg | Freq: Two times a day (BID) | INTRAVENOUS | Status: DC
  Administered 2012-09-01 – 2012-09-02 (×3): 40 mg via INTRAVENOUS
  Filled 2012-09-01 (×4): qty 40

## 2012-09-01 MED ORDER — NALOXONE HCL 0.4 MG/ML IJ SOLN
INTRAMUSCULAR | Status: AC
  Administered 2012-09-01: 09:00:00
  Filled 2012-09-01: qty 1

## 2012-09-01 MED ORDER — NALOXONE HCL 0.4 MG/ML IJ SOLN
INTRAMUSCULAR | Status: AC
  Filled 2012-09-01: qty 1

## 2012-09-01 MED ORDER — ACETAMINOPHEN 650 MG RE SUPP: 650.0000 mg | RECTAL | Status: DC | PRN

## 2012-09-01 MED ORDER — PROCHLORPERAZINE EDISYLATE 5 MG/ML IJ SOLN
10.0000 mg | Freq: Once | INTRAMUSCULAR | Status: AC
  Administered 2012-09-01: 10 mg via INTRAVENOUS
  Filled 2012-09-01: qty 2

## 2012-09-01 NOTE — Progress Notes (Signed)
Pt started vomiting. Pt was given 4mg  of zofran IV.

## 2012-09-01 NOTE — Progress Notes (Signed)
No further melena or coffee-ground emesis. She still complains of her chronic reflux symptoms.  At this point I have nothing more to suggest. After discharge I will have her followup with Dr. Bosie Clos her primary gastroenterologist and also at [facility]. We will sign off.

## 2012-09-01 NOTE — Evaluation (Signed)
Clinical/Bedside Swallow Evaluation Patient Details  Name: Brittany [name] MRN: [id number] Date of Birth: [birth year]  Today's Date: 09/01/2012 Time: 4782-9562 SLP Time Calculation (min): 11 min  Past Medical History:  Past Medical History  Diagnosis Date  . Hypercholesteremia   . Complication of anesthesia   . PONV (postoperative nausea and vomiting)   . Hypothyroidism   . Depression   . GERD (gastroesophageal reflux disease)   . H/O hiatal hernia   . Anemia   . Cataract   . Anxiety   . Osteoporosis    Past Surgical History:  Past Surgical History  Procedure Laterality Date  . Hip surgery  1970s    "hip sunk in"  . Tubal ligation  1981  . Tmj arthroplasty  1992  . Abdominal hysterectomy  2002    total  . Cholecystectomy  2006  . Neck surgery  2012  . Thyroid surgery      nodule removed, right side  . Rotator cuff repair  2013 november    right side  . Esophagogastroduodenoscopy  05/26/2012    Procedure: ESOPHAGOGASTRODUODENOSCOPY (EGD);  Surgeon: Shirley Friar, MD;  Location: [facility];  Service: Endoscopy;  Laterality: N/A;  . Bravo ph study  05/26/2012    Procedure: BRAVO PH STUDY;  Surgeon: Shirley Friar, MD;  Location: [facility];  Service: Endoscopy;  Laterality: N/A;  . Esophagogastroduodenoscopy  05/30/2012    Procedure: ESOPHAGOGASTRODUODENOSCOPY (EGD);  Surgeon: Florencia Reasons, MD;  Location: [facility];  Service: Endoscopy;  Laterality: N/A;  . Eye surgery    . Esophagogastroduodenoscopy N/A 08/31/2012    Procedure: ESOPHAGOGASTRODUODENOSCOPY (EGD);  Surgeon: Graylin Shiver, MD;  Location[facility];  Service: Endoscopy;  Laterality: N/A;   HPI:  Brittany [name] is an 62 y.o. female admitted for possible slow Upper GI bleed. She has a history of GERD and gastroparesis. She recently had capsule enteroscopy with pH monitoring. From chart-She had an EGD done 4 days after her bravo capsule was placed because she felt there was a foreign body in her  esophagus , thought was chicken but the endoscopy showed only a small hiatal hernia and the bravo capsule was still in place. There was no obstruction. Patient was last seen normal at [phone]. At 8 AM patient was found to be unresponsive to painfull stimuli. RR nurse was called but by the time they arrived patient was more arousal and complaining of HA and sever abd pain. It is thought she has increased left arm weakness and noted left tongue deviation.  CT head showed no acute infarct. 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 Endoscopy negative for GI abnormality.    Assessment / Plan / Recommendation Clinical Impression  Pt demosntrates adequate oral and oropharyngeal function. There is extensive history of GERD and vomiting. SLP briefly offered basic esophageal precautions of which the pt is aware. The pt did not demosntrate any difficutly during exam. Recommend continuing regular diet and thin liquids. No SLP f/u recommended.     Aspiration Risk       Diet Recommendation Regular;Thin liquid   Liquid Administration via: Cup;Straw Medication Administration: Whole meds with liquid Supervision: Patient able to self feed Compensations: Slow rate;Small sips/bites;Follow solids with liquid Postural Changes and/or Swallow Maneuvers: Seated upright 90 degrees;Upright 30-60 min after meal    Other  Recommendations Oral Care Recommendations: Patient independent with oral care   Follow Up Recommendations  None    Frequency and Duration  Pertinent Vitals/Pain NA    SLP Swallow Goals     Swallow Study Prior Functional Status       General HPI: Brittany [name] is an 62 y.o. female admitted for possible slow Upper GI bleed. She has a history of GERD and gastroparesis. She recently had capsule enteroscopy with pH monitoring. From chart-She had an EGD done 4 days after her bravo capsule was placed because she felt there was a foreign body in her esophagus ,  thought was chicken but the endoscopy showed only a small hiatal hernia and the bravo capsule was still in place. There was no obstruction. Patient was last seen normal at [phone]. At 8 AM patient was found to be unresponsive to painfull stimuli. RR nurse was called but by the time they arrived patient was more arousal and complaining of HA and sever abd pain. It is thought she has increased left arm weakness and noted left tongue deviation.  CT head showed no acute infarct. 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 Endoscopy negative for GI abnormality.  Type of Study: Bedside swallow evaluation Previous Swallow Assessment: Esophagram 2012 - normal oropharyngeal swallow, mild GER Diet Prior to this Study: Regular;Thin liquids Temperature Spikes Noted: No History of Recent Intubation: No Behavior/Cognition: Alert;Cooperative Oral Cavity - Dentition: Poor condition;Missing dentition Self-Feeding Abilities: Able to feed self Patient Positioning: Upright in bed Baseline Vocal Quality: Clear Volitional Cough: Strong Volitional Swallow: Able to elicit    Oral/Motor/Sensory Function Overall Oral Motor/Sensory Function: Appears within functional limits for tasks assessed   Ice Chips     Thin Liquid Thin Liquid: Within functional limits Presentation: Cup;Straw    Nectar Thick Nectar Thick Liquid: Not tested   Honey Thick Honey Thick Liquid: Not tested   Puree Puree: Not tested   Solid   GO    Solid: Within functional limits      [facility], [place] CCC-SLP [phone]  Claudine Mouton 09/01/2012,4:05 PM

## 2012-09-01 NOTE — Progress Notes (Signed)
Addendum  Patient seen and examined, chart and data base reviewed.  I agree with the above assessment and plan.  For full details please see Mrs. Algis Downs PA note.  Rapid response for unresponsiveness. Likely secondary to the Fentanyl effect, resolved after Narcan administration.    Clint Lipps, MD [facility] Pager: [phone] 09/01/2012, 12:44 PM

## 2012-09-01 NOTE — Consult Note (Signed)
NEURO HOSPITALIST CONSULT NOTE    Reason for Consult: briefAMS with ? stroke  HPI:                                                                                                                                          Brittany [name] is an 62 y.o. female admitted for possible slow Upper GI bleed. From chart-She had an EGD done 4 days after her bravo capsule was placed because she felt there was a foreign body in her esophagus , thought was chicken but the endoscopy showed only a small hiatal hernia and the bravo capsule was still in place. There was no obstruction. Patient was last seen normal at [phone]. At 8 AM patient was found to be unresponsive to painfull stimuli. RR nurse was called but by the time they arrived patient was more arousal and complaining of HA and sever abd pain. It is thought she has increased left arm weakness and noted left tongue deviation.  CT head showed no acute infarct. Neurology was consulted to see patient.    Past Medical History  Diagnosis Date  . Hypercholesteremia   . Complication of anesthesia   . PONV (postoperative nausea and vomiting)   . Hypothyroidism   . Depression   . GERD (gastroesophageal reflux disease)   . H/O hiatal hernia   . Anemia   . Cataract   . Anxiety   . Osteoporosis     Past Surgical History  Procedure Laterality Date  . Hip surgery  1970s    "hip sunk in"  . Tubal ligation  1981  . Tmj arthroplasty  1992  . Abdominal hysterectomy  2002    total  . Cholecystectomy  2006  . Neck surgery  2012  . Thyroid surgery      nodule removed, right side  . Rotator cuff repair  2013 november    right side  . Esophagogastroduodenoscopy  05/26/2012    Procedure: ESOPHAGOGASTRODUODENOSCOPY (EGD);  Surgeon: Shirley Friar, MD;  Location: [facility];  Service: Endoscopy;  Laterality: N/A;  . Bravo ph study  05/26/2012    Procedure: BRAVO PH STUDY;  Surgeon: Shirley Friar, MD;  Location: [facility] ENDOSCOPY;   Service: Endoscopy;  Laterality: N/A;  . Esophagogastroduodenoscopy  05/30/2012    Procedure: ESOPHAGOGASTRODUODENOSCOPY (EGD);  Surgeon: Florencia Reasons, MD;  Location: [facility];  Service: Endoscopy;  Laterality: N/A;  . Eye surgery    . Esophagogastroduodenoscopy N/A 08/31/2012    Procedure: ESOPHAGOGASTRODUODENOSCOPY (EGD);  Surgeon: Graylin Shiver, MD;  Location: [facility] ENDOSCOPY;  Service: Endoscopy;  Laterality: N/A;    Family History  Problem Relation Age of Onset  . Hypertension Mother   . Kidney disease Mother   . Cancer Sister     Social  History:  reports that she quit smoking about 4 years ago. She does not have any smokeless tobacco history on file. She reports that she does not drink alcohol or use illicit drugs.  Allergies  Allergen Reactions  . Lithium Anaphylaxis  . Morphine Nausea And Vomiting  . Pseudoephedrine Hypertension    MEDICATIONS:                                                                                                                     Scheduled: . antiseptic oral rinse  15 mL Mouth Rinse q12n4p  . calcium carbonate  1 tablet Oral BID WC  . chlorhexidine  15 mL Mouth Rinse BID  . citalopram  40 mg Oral q morning - 10a  . levothyroxine  25 mcg Oral QAC breakfast  . loratadine  10 mg Oral QHS  . naloxone      . pantoprazole (PROTONIX) IV  40 mg Intravenous Q12H  . sodium chloride  3 mL Intravenous Q12H  . sucralfate  1 g Oral TID WC & HS  . traZODone  50 mg Oral QHS     ROS:                                                                                                                                       History obtained from the patient  General ROS: negative for - chills, fatigue, fever, night sweats, weight gain or weight loss Psychological ROS: negative for - behavioral disorder, hallucinations, memory difficulties, mood swings or suicidal ideation Ophthalmic ROS: negative for - blurry vision, double vision, eye pain or loss of  vision ENT ROS: negative for - epistaxis, nasal discharge, oral lesions, sore throat, tinnitus or vertigo Allergy and Immunology ROS: negative for - hives or itchy/watery eyes Hematological and Lymphatic ROS: negative for - bleeding problems, bruising or swollen lymph nodes Endocrine ROS: negative for - galactorrhea, hair pattern changes, polydipsia/polyuria or temperature intolerance Respiratory ROS: negative for - cough, hemoptysis, shortness of breath or wheezing Cardiovascular ROS: negative for - chest pain, dyspnea on exertion, edema or irregular heartbeat Gastrointestinal ROS: negative for - abdominal pain, diarrhea, hematemesis, nausea/vomiting or stool incontinence Genito-Urinary ROS: negative for - dysuria, hematuria, incontinence or urinary frequency/urgency Musculoskeletal ROS: negative for - joint swelling or muscular weakness Neurological ROS: as noted in HPI Dermatological ROS: negative for rash and skin lesion changes   Blood pressure 146/85, pulse  80, temperature 97.5 F (36.4 C), temperature source Axillary, resp. rate 20, height 5\' 6"  (1.676 m), weight 58.6 kg (129 lb 3 oz), SpO2 100.00%.   Neurologic Examination:                                                                                                      Mental Status: Alert, oriented, flat affect.  Speech fluent without evidence of aphasia.  Able to follow 3 step commands without difficulty. Cranial Nerves: II: Discs flat bilaterally; Visual fields grossly normal, pupils equal, round, reactive to light and accommodation III,IV, VI: ptosis not present, extra-ocular motions intact bilaterally V,VII: smile asymmetric on the left but inconsistant, facial light touch sensation splits midline to tuning fork  VIII: hearing normal bilaterally IX,X: gag reflex present XI: bilateral shoulder shrug XII: midline tongue extension with no problems deviating to left and right when asked.  Motor: Right : Upper extremity    5/5    Left:     Upper extremity   See note  Lower extremity   5/5     Lower extremity   See note --Significant give way strength on the left arm and leg with inconsistent strength. On formal exam she shows 4/5 strength when distracted and able to hold left leg off bed on the left.  Tone and bulk:normal tone throughout; no atrophy noted Sensory: Pinprick and light touch intact stated to be absent in left arm and leg but withdraws from pain on leg when distracted.  Deep Tendon Reflexes: 2+ and symmetric throughout UE and 1+ KJ bilaterally with no AJ Plantars: Mute bilaterally Cerebellar: normal finger-to-nose,  normal heel-to-shin test o the right and also on left leg CV: pulses palpable throughout    No results found for this basename: cbc, bmp, coags, chol, tri, ldl, hga1c    Results for orders placed during the hospital encounter of 08/30/12 (from the past 48 hour(s))  SAMPLE TO BLOOD BANK     Status: None   Collection Time    08/30/12  8:27 PM      Result Value Range   Blood Bank Specimen SAMPLE AVAILABLE FOR TESTING     Sample Expiration 08/31/2012    TROPONIN I     Status: None   Collection Time    08/30/12  8:28 PM      Result Value Range   Troponin I <0.30  <0.30 ng/mL   Comment:            Due to the release kinetics of cTnI,     a negative result within the first hours     of the onset of symptoms does not rule out     myocardial infarction with certainty.     If myocardial infarction is still suspected,     repeat the test at appropriate intervals.  CBC WITH DIFFERENTIAL     Status: Abnormal   Collection Time    08/30/12  8:38 PM      Result Value Range   WBC 5.8  4.0 - 10.5 K/uL   RBC 3.37 (*)  3.87 - 5.11 MIL/uL   Hemoglobin 9.9 (*) 12.0 - 15.0 g/dL   HCT 16.1 (*) 09.6 - 04.5 %   MCV 90.5  78.0 - 100.0 fL   MCH 29.4  26.0 - 34.0 pg   MCHC 32.5  30.0 - 36.0 g/dL   RDW 40.9  81.1 - 91.4 %   Platelets 206  150 - 400 K/uL   Neutrophils Relative 28 (*) 43 - 77 %    Neutro Abs 1.6 (*) 1.7 - 7.7 K/uL   Lymphocytes Relative 55 (*) 12 - 46 %   Lymphs Abs 3.2  0.7 - 4.0 K/uL   Monocytes Relative 14 (*) 3 - 12 %   Monocytes Absolute 0.8  0.1 - 1.0 K/uL   Eosinophils Relative 4  0 - 5 %   Eosinophils Absolute 0.2  0.0 - 0.7 K/uL   Basophils Relative 0  0 - 1 %   Basophils Absolute 0.0  0.0 - 0.1 K/uL  COMPREHENSIVE METABOLIC PANEL     Status: Abnormal   Collection Time    08/30/12  8:38 PM      Result Value Range   Sodium 142  135 - 145 mEq/L   Potassium 3.9  3.5 - 5.1 mEq/L   Chloride 108  96 - 112 mEq/L   CO2 28  19 - 32 mEq/L   Glucose, Bld 82  70 - 99 mg/dL   BUN 9  6 - 23 mg/dL   Creatinine, Ser 7.82  0.50 - 1.10 mg/dL   Calcium 9.1  8.4 - 95.6 mg/dL   Total Protein 6.7  6.0 - 8.3 g/dL   Albumin 3.3 (*) 3.5 - 5.2 g/dL   AST 21  0 - 37 U/L   ALT 12  0 - 35 U/L   Alkaline Phosphatase 86  39 - 117 U/L   Total Bilirubin 0.1 (*) 0.3 - 1.2 mg/dL   GFR calc non Af Amer 68 (*) >90 mL/min   GFR calc Af Amer 79 (*) >90 mL/min   Comment:            The eGFR has been calculated     using the CKD EPI equation.     This calculation has not been     validated in all clinical     situations.     eGFR's persistently     <90 mL/min signify     possible Chronic Kidney Disease.  LIPASE, BLOOD     Status: None   Collection Time    08/30/12  8:38 PM      Result Value Range   Lipase 36  11 - 59 U/L  LACTIC ACID, PLASMA     Status: None   Collection Time    08/30/12  8:38 PM      Result Value Range   Lactic Acid, Venous 0.7  0.5 - 2.2 mmol/L  OCCULT BLOOD, POC DEVICE     Status: Abnormal   Collection Time    08/30/12  8:47 PM      Result Value Range   Fecal Occult Bld POSITIVE (*) NEGATIVE  TSH     Status: Abnormal   Collection Time    08/31/12  3:15 AM      Result Value Range   TSH 4.830 (*) 0.350 - 4.500 uIU/mL  CBC     Status: Abnormal   Collection Time    08/31/12  3:15 AM      Result Value Range  WBC 6.1  4.0 - 10.5 K/uL   RBC 3.35  (*) 3.87 - 5.11 MIL/uL   Hemoglobin 10.0 (*) 12.0 - 15.0 g/dL   HCT 16.1 (*) 09.6 - 04.5 %   MCV 90.1  78.0 - 100.0 fL   MCH 29.9  26.0 - 34.0 pg   MCHC 33.1  30.0 - 36.0 g/dL   RDW 40.9  81.1 - 91.4 %   Platelets 182  150 - 400 K/uL  CBC     Status: Abnormal   Collection Time    08/31/12  7:55 AM      Result Value Range   WBC 4.8  4.0 - 10.5 K/uL   RBC 3.38 (*) 3.87 - 5.11 MIL/uL   Hemoglobin 10.2 (*) 12.0 - 15.0 g/dL   HCT 78.2 (*) 95.6 - 21.3 %   MCV 89.3  78.0 - 100.0 fL   MCH 30.2  26.0 - 34.0 pg   MCHC 33.8  30.0 - 36.0 g/dL   RDW 08.6  57.8 - 46.9 %   Platelets 182  150 - 400 K/uL  CBC     Status: Abnormal   Collection Time    08/31/12  1:11 PM      Result Value Range   WBC 4.8  4.0 - 10.5 K/uL   RBC 3.32 (*) 3.87 - 5.11 MIL/uL   Hemoglobin 9.8 (*) 12.0 - 15.0 g/dL   HCT 62.9 (*) 52.8 - 41.3 %   MCV 91.6  78.0 - 100.0 fL   MCH 29.5  26.0 - 34.0 pg   MCHC 32.2  30.0 - 36.0 g/dL   RDW 24.4  01.0 - 27.2 %   Platelets 183  150 - 400 K/uL  CBC     Status: Abnormal   Collection Time    08/31/12  7:25 PM      Result Value Range   WBC 5.9  4.0 - 10.5 K/uL   RBC 3.35 (*) 3.87 - 5.11 MIL/uL   Hemoglobin 9.8 (*) 12.0 - 15.0 g/dL   HCT 53.6 (*) 64.4 - 03.4 %   MCV 91.3  78.0 - 100.0 fL   MCH 29.3  26.0 - 34.0 pg   MCHC 32.0  30.0 - 36.0 g/dL   RDW 74.2  59.5 - 63.8 %   Platelets 194  150 - 400 K/uL  GLUCOSE, CAPILLARY     Status: Abnormal   Collection Time    09/01/12  8:56 AM      Result Value Range   Glucose-Capillary 108 (*) 70 - 99 mg/dL  CBC     Status: Abnormal   Collection Time    09/01/12  9:55 AM      Result Value Range   WBC 6.3  4.0 - 10.5 K/uL   RBC 3.69 (*) 3.87 - 5.11 MIL/uL   Hemoglobin 10.9 (*) 12.0 - 15.0 g/dL   HCT 75.6 (*) 43.3 - 29.5 %   MCV 90.0  78.0 - 100.0 fL   MCH 29.5  26.0 - 34.0 pg   MCHC 32.8  30.0 - 36.0 g/dL   RDW 18.8  41.6 - 60.6 %   Platelets 206  150 - 400 K/uL  LACTIC ACID, PLASMA     Status: Abnormal   Collection Time     09/01/12  9:55 AM      Result Value Range   Lactic Acid, Venous 3.5 (*) 0.5 - 2.2 mmol/L  BASIC METABOLIC PANEL     Status: Abnormal  Collection Time    09/01/12  9:55 AM      Result Value Range   Sodium 143  135 - 145 mEq/L   Potassium 4.0  3.5 - 5.1 mEq/L   Chloride 110  96 - 112 mEq/L   CO2 23  19 - 32 mEq/L   Glucose, Bld 105 (*) 70 - 99 mg/dL   BUN 4 (*) 6 - 23 mg/dL   Creatinine, Ser 4.78  0.50 - 1.10 mg/dL   Calcium 9.2  8.4 - 29.5 mg/dL   GFR calc non Af Amer 75 (*) >90 mL/min   GFR calc Af Amer 87 (*) >90 mL/min   Comment:            The eGFR has been calculated     using the CKD EPI equation.     This calculation has not been     validated in all clinical     situations.     eGFR's persistently     <90 mL/min signify     possible Chronic Kidney Disease.  GLUCOSE, CAPILLARY     Status: Abnormal   Collection Time    09/01/12 11:37 AM      Result Value Range   Glucose-Capillary 104 (*) 70 - 99 mg/dL    Ct Head Wo Contrast  09/01/2012  *RADIOLOGY REPORT*  Clinical Data: Headache with vomiting status post recent fall.  CT HEAD WITHOUT CONTRAST  Technique:  Contiguous axial images were obtained from the base of the skull through the vertex without contrast.  Comparison: Head CT 12/13/2011.  Findings: [name] head is tilted within the CT gantry. Reformatted images were generated to partially correct for this. There is mild motion artifact.  Ill-defined increased density within the left lentiform nucleus on image 15 is probably artifactual.  There is no abnormality on the adjacent images or on the reformatted images.  I do not see any evidence of acute intracranial hemorrhage, mass lesion, brain edema or extra-axial fluid collection.  The ventricles and subarachnoid spaces appear appropriately sized for age.  There is minimal ethmoid sinus mucosal thickening without air fluid levels.  The mastoids and middle ears are clear.  No calvarial fracture is identified.   IMPRESSION: Stable examination.  No acute intracranial or calvarial findings identified.   Original Report Authenticated By: Carey Bullocks, M.D.    Ct Abdomen Pelvis W Contrast  08/31/2012  *RADIOLOGY REPORT*  Clinical Data: Abdominal pain with nausea, vomiting, and diarrhea. Dark stools.  Dark emesis.  CT ABDOMEN AND PELVIS WITH CONTRAST  Technique:  Multidetector CT imaging of the abdomen and pelvis was performed following the standard protocol during bolus administration of intravenous contrast.  Contrast: OMNIPAQUE IOHEXOL 300 MG/ML  SOLN  Comparison: CT scan dated 12/29/2010 and radiographs dated 08/30/2012  Findings: There is a moderate hiatal hernia.  The bowel is otherwise normal including the terminal ileum and appendix.  Cecum lies low in the right side of the pelvis as does the appendix. Uterus and ovaries and gallbladder have been removed.  Liver, spleen, pancreas, adrenal glands, bile ducts, and kidneys are normal except for two tiny cysts on the lower pole of the left kidney and a small stone in the lower pole of the left kidney.  IMPRESSION: No acute abnormalities.  Hiatal hernia.  Single stone in the lower pole of the left kidney.   Original Report Authenticated By: Francene Boyers, M.D.    Dg Abd Acute W/chest  08/30/2012  *RADIOLOGY REPORT*  Clinical Data: Generalized abdominal pain and vomiting.  ACUTE ABDOMEN SERIES (ABDOMEN 2 VIEW & CHEST 1 VIEW)  Comparison: Chest radiograph performed 06/02/2012, and CT of the chest, abdomen and pelvis performed 02/10/2011  Findings: The lungs are well-aerated.  Residual mild right apical and midlung airspace opacity may reflect scarring, or possibly mild recurrent infection.  This is noted in a similar distribution to the prior study.  There is no evidence of pleural effusion or pneumothorax.  The cardiomediastinal silhouette is within normal limits.  The visualized bowel gas pattern is unremarkable.  Scattered stool and air are seen within the  colon; there is no evidence of small bowel dilatation to suggest obstruction.  No free intra-abdominal air is identified on the provided upright view.  No acute osseous abnormalities are seen; the sacroiliac joints are unremarkable in appearance.  Cervical spinal fusion hardware is noted.  Clips are noted within the right upper quadrant, reflecting prior cholecystectomy.  IMPRESSION:  1.  Unremarkable bowel gas pattern; no free intra-abdominal air seen.  Moderate amount of stool noted in the colon. 2.  Residual mild right apical and midlung airspace opacity may reflect scarring, or possibly mild recurrent pneumonia.  This is seen in a similar distribution to the prior study from January.   Original Report Authenticated By: Tonia Ghent, M.D.    Dg Abd Portable 2v  09/01/2012  *RADIOLOGY REPORT*  Clinical Data: Abdominal pain.  Question free intraperitoneal air.  PORTABLE ABDOMEN - 2 VIEW  Comparison: CT abdomen and pelvis 08/30/2012.  Findings: No free intraperitoneal air is identified.  Contrast material from the patient's CT scan is seen throughout the colon. No evidence of bowel obstruction.  IMPRESSION: No acute finding.   Original Report Authenticated By: Holley Dexter, M.D.      Assessment/Plan: 62 YO female with new onset AMS this am and new complaints of left sided weakness and decreased sensation. Initial CT head was negative for acute CVA, Exam shows inconsistent/effort dependant strength, positive hoover's, splits midline to tuning fork on forehead.  Patient currently in MRI.    Assessment and plan discussed with with attending physician and they are in agreement.    Felicie Morn PA-C [facility] [phone]  09/01/2012, 12:12 PM     I have seen and evaluated the patient. I have reviewed the above note and made appropriate changes. She described face arm and leg involvement. By the time of my exam, the patient's symptom had improved, but still present. She described  facial weakness on the left, but was able to puff her cheeks out and held her left cheek tightly as the right cheek puffed. She had an inconsistent motor exam that was encourageable.   The exam coupled with a normal MRI would argue strongly for a psychogenic nature to her symptoms. At this time, I do not feel that any further neurological workup needs to be done. I discussed the nature of the spell with the patient.    Neurology will sign off, please call with any further questions.   Ritta Slot, MD [facility] [phone]  If 7pm- 7am, please page neurology on call at [phone].

## 2012-09-01 NOTE — Progress Notes (Signed)
[facility] PROGRESS NOTE  Brittany [name] ZOX:[id number] DOB: [birth year] DOA: 08/30/2012 PCP: Carmin Richmond, MD  09/01/12 Upon entering the room this morning I found Brittany [name] obtunded. She was last seen normal at 8:00 am on 4/17She barely responded to painful stimuli.  Rapid response was called.  When Brittany [name] did begin to come around she was complaining of a severe headache and severe abdominal pain.  She wretched multiple times.  Now with left arm weakness and left tongue deviation.  Assessment/Plan:   Altered Mental Status -Was last seen normal at 8:00 am on 4/17 -found non-responsive on 4/17 at approx 8:30.  -Had received Fentanyl 25 mcg 30 min prior. -Rapid response called.  Narcan given with some positive results. -Patient still drowsy.  Now demonstrated Left upper extremity weakness and tongue deviation.  Also complaining of numbness in the left side of her face and left arm.  Rule out stroke --Patient still drowsy.  11:30 am Now demonstrated Left upper extremity weakness and tongue deviation.  Also complaining of numbness in the left side of her face and left arm. -CT Head 4/17 negative -MRI Brain pending -Neuro Consulted.  Urinary Retention Patient complaining that she is unable to urinate  Bladder scan showed 750 ml in place. Foley placed.  Severe GERD with coffee ground emesis -History of same with on-going work up by [facility] -Upper EGD 4/16 by Dr. Evette Cristal showed no ulcerations or bleeding only hiatal hernia and erythematous mucosa. -Started on protonix drip at admission.  Changed to IV protonix BID. Given supportive txt with zofran, IVF. - Patient still with significant wretching and acid sensation  Abdominal pain -Uncertain etiology .   -significantly tender to palpation in central abdomen and epigastic area (she jumps) -CT abdomen pelvis negative for acute abnormalities.  Moderate hiatal hernia.  Hypothyroidism -Continue levothyroxine    Anxiety -stable -Continue citalopram -Continue clorazepate  -Continue trazodone   Normocytic Anemia with possible acute blood loss anemia. -Guiac positive in the ED - baseline Hgb appears to be 11-12.  Currently 10.9   Code Status: Full Family Communication: None Disposition Plan: Inpatient   Consultants:  [facility]  Neuro  Procedures:  Endoscopy scheduled for 11am   Antibiotics:    HPI/Subjective: No complaints of pain. Patient is sitting in chair at bedside. No recent vomiting or BMs. Patient is NPO and scheduled for endoscopy.   Objective: Filed Vitals:   08/31/12 2105 09/01/12 0524 09/01/12 0854 09/01/12 0902  BP: 126/75 134/76 165/94 146/85  Pulse: 73 69 86 80  Temp: 98.3 F (36.8 C) 97.4 F (36.3 C) 97.5 F (36.4 C)   TempSrc: Oral Oral Axillary   Resp: 18 20 20 20   Height:      Weight:      SpO2: 93% 97% 100% 100%    Intake/Output Summary (Last 24 hours) at 09/01/12 1201 Last data filed at 09/01/12 8119  Gross per 24 hour  Intake 2707.67 ml  Output      0 ml  Net 2707.67 ml   Filed Weights   08/31/12 0211  Weight: 58.6 kg (129 lb 3 oz)    Exam:   General: WDWN female, resting comfortably seated in chair, no apparent distress  HEENT: PERRLA, sclera/conjunctiva clear, mucous membranes moist,  Neck: supple, no JVD, no cervical lymphadenopathy   Cardiovascular: RRR, no murmurs/rubs/gallops,   Respiratory: clear to auscultation anterior and posterior fields bilaterally, no rales/rhonchi/wheezes   Abdomen: soft non-distended, tender to deep palpation in RUQ and epigastric  areas, normal BS, no ascites appreciated, no guarding, no rigidity  Musculoskeletal: moves all extremities spontaneously, no lower leg edema, distal pulses present and even bilaterally  Neurological: A&O, CN 2-12 grossly intact, cerebella exam intact, normal affect   Data Reviewed: Basic Metabolic Panel:  Recent Labs Lab 08/30/12 2038 09/01/12 0955  NA 142  143  K 3.9 4.0  CL 108 110  CO2 28 23  GLUCOSE 82 105*  BUN 9 4*  CREATININE 0.90 0.83  CALCIUM 9.1 9.2   Liver Function Tests:  Recent Labs Lab 08/30/12 2038  AST 21  ALT 12  ALKPHOS 86  BILITOT 0.1*  PROT 6.7  ALBUMIN 3.3*    Recent Labs Lab 08/30/12 2038  LIPASE 36   CBC:  Recent Labs Lab 08/30/12 2038 08/31/12 0315 08/31/12 0755 08/31/12 1311 08/31/12 1925 09/01/12 0955  WBC 5.8 6.1 4.8 4.8 5.9 6.3  NEUTROABS 1.6*  --   --   --   --   --   HGB 9.9* 10.0* 10.2* 9.8* 9.8* 10.9*  HCT 30.5* 30.2* 30.2* 30.4* 30.6* 33.2*  MCV 90.5 90.1 89.3 91.6 91.3 90.0  PLT 206 182 182 183 194 206   Cardiac Enzymes:  Recent Labs Lab 08/30/12 2028  TROPONINI <0.30    Studies: Ct Head Wo Contrast  09/01/2012  *RADIOLOGY REPORT*  Clinical Data: Headache with vomiting status post recent fall.  CT HEAD WITHOUT CONTRAST  Technique:  Contiguous axial images were obtained from the base of the skull through the vertex without contrast.  Comparison: Head CT 12/13/2011.  Findings: [name] head is tilted within the CT gantry. Reformatted images were generated to partially correct for this. There is mild motion artifact.  Ill-defined increased density within the left lentiform nucleus on image 15 is probably artifactual.  There is no abnormality on the adjacent images or on the reformatted images.  I do not see any evidence of acute intracranial hemorrhage, mass lesion, brain edema or extra-axial fluid collection.  The ventricles and subarachnoid spaces appear appropriately sized for age.  There is minimal ethmoid sinus mucosal thickening without air fluid levels.  The mastoids and middle ears are clear.  No calvarial fracture is identified.  IMPRESSION: Stable examination.  No acute intracranial or calvarial findings identified.   Original Report Authenticated By: Carey Bullocks, M.D.    Ct Abdomen Pelvis W Contrast  08/31/2012  *RADIOLOGY REPORT*  Clinical Data: Abdominal pain  with nausea, vomiting, and diarrhea. Dark stools.  Dark emesis.  CT ABDOMEN AND PELVIS WITH CONTRAST  Technique:  Multidetector CT imaging of the abdomen and pelvis was performed following the standard protocol during bolus administration of intravenous contrast.  Contrast: OMNIPAQUE IOHEXOL 300 MG/ML  SOLN  Comparison: CT scan dated 12/29/2010 and radiographs dated 08/30/2012  Findings: There is a moderate hiatal hernia.  The bowel is otherwise normal including the terminal ileum and appendix.  Cecum lies low in the right side of the pelvis as does the appendix. Uterus and ovaries and gallbladder have been removed.  Liver, spleen, pancreas, adrenal glands, bile ducts, and kidneys are normal except for two tiny cysts on the lower pole of the left kidney and a small stone in the lower pole of the left kidney.  IMPRESSION: No acute abnormalities.  Hiatal hernia.  Single stone in the lower pole of the left kidney.   Original Report Authenticated By: Francene Boyers, M.D.    Dg Abd Acute W/chest  08/30/2012  *RADIOLOGY REPORT*  Clinical Data:  Generalized abdominal pain and vomiting.  ACUTE ABDOMEN SERIES (ABDOMEN 2 VIEW & CHEST 1 VIEW)  Comparison: Chest radiograph performed 06/02/2012, and CT of the chest, abdomen and pelvis performed 02/10/2011  Findings: The lungs are well-aerated.  Residual mild right apical and midlung airspace opacity may reflect scarring, or possibly mild recurrent infection.  This is noted in a similar distribution to the prior study.  There is no evidence of pleural effusion or pneumothorax.  The cardiomediastinal silhouette is within normal limits.  The visualized bowel gas pattern is unremarkable.  Scattered stool and air are seen within the colon; there is no evidence of small bowel dilatation to suggest obstruction.  No free intra-abdominal air is identified on the provided upright view.  No acute osseous abnormalities are seen; the sacroiliac joints are unremarkable in appearance.   Cervical spinal fusion hardware is noted.  Clips are noted within the right upper quadrant, reflecting prior cholecystectomy.  IMPRESSION:  1.  Unremarkable bowel gas pattern; no free intra-abdominal air seen.  Moderate amount of stool noted in the colon. 2.  Residual mild right apical and midlung airspace opacity may reflect scarring, or possibly mild recurrent pneumonia.  This is seen in a similar distribution to the prior study from January.   Original Report Authenticated By: Tonia Ghent, M.D.    Dg Abd Portable 2v  09/01/2012  *RADIOLOGY REPORT*  Clinical Data: Abdominal pain.  Question free intraperitoneal air.  PORTABLE ABDOMEN - 2 VIEW  Comparison: CT abdomen and pelvis 08/30/2012.  Findings: No free intraperitoneal air is identified.  Contrast material from the patient's CT scan is seen throughout the colon. No evidence of bowel obstruction.  IMPRESSION: No acute finding.   Original Report Authenticated By: Holley Dexter, M.D.     Scheduled Meds: . antiseptic oral rinse  15 mL Mouth Rinse q12n4p  . calcium carbonate  1 tablet Oral BID WC  . chlorhexidine  15 mL Mouth Rinse BID  . citalopram  40 mg Oral q morning - 10a  . levothyroxine  25 mcg Oral QAC breakfast  . loratadine  10 mg Oral QHS  . naloxone      . pantoprazole (PROTONIX) IV  40 mg Intravenous Q12H  . sodium chloride  3 mL Intravenous Q12H  . sucralfate  1 g Oral TID WC & HS  . traZODone  50 mg Oral QHS   Continuous Infusions: . dextrose 5 % and 0.9 % NaCl with KCl 20 mEq/L 100 mL/hr at 09/01/12 1104    Active Problems:   TOBACCO ABUSE   Chronic airway obstruction, not elsewhere classified   G E REFLUX   Upper GI bleed    Algis Downs, PA-C [facility] Pager: [phone]  09/01/2012, 12:01 PM  LOS: 2 days

## 2012-09-02 DIAGNOSIS — M6281 Muscle weakness (generalized): Secondary | ICD-10-CM

## 2012-09-02 MED ORDER — PANTOPRAZOLE SODIUM 40 MG PO TBEC: 40.0000 mg | DELAYED_RELEASE_TABLET | Freq: Every day | ORAL | Status: DC

## 2012-09-02 NOTE — Progress Notes (Signed)
Brittany [name] to be D/C'd Home per MD order.  Discussed with the patient and all questions fully answered.    Medication List    TAKE these medications       aluminum hydroxide-magnesium carbonate 95-358 MG/15ML Susp  Commonly known as:  GAVISCON  Take 15 mLs by mouth every 6 (six) hours as needed (for gas).     aspirin EC 81 MG tablet  Take 81 mg by mouth every evening.     calcium carbonate 600 MG Tabs  Commonly known as:  OS-CAL  Take 600 mg by mouth 2 (two) times daily with a meal.     citalopram 40 MG tablet  Commonly known as:  CELEXA  Take 40 mg by mouth every morning.     clorazepate 7.5 MG tablet  Commonly known as:  TRANXENE  Take 4 mg by mouth at bedtime as needed for anxiety or sleep. Anxiety     levothyroxine 25 MCG tablet  Commonly known as:  SYNTHROID, LEVOTHROID  Take 25 mcg by mouth every morning.     loratadine 10 MG tablet  Commonly known as:  CLARITIN  Take 10 mg by mouth at bedtime.     pantoprazole 40 MG tablet  Commonly known as:  PROTONIX  Take 1 tablet (40 mg total) by mouth daily.     traZODone 50 MG tablet  Commonly known as:  DESYREL  Take 50 mg by mouth at bedtime.     WOMENS 50+ MULTI VITAMIN/MIN PO  Take 1 tablet by mouth daily.        VVS, Skin clean, dry and intact without evidence of skin break down, no evidence of skin tears noted. IV catheter discontinued intact. Site without signs and symptoms of complications. Dressing and pressure applied.  An After Visit Summary was printed and given to the patient. Patient escorted via WC, and D/C home via private auto.  Kennyth Arnold D 09/02/2012 1:17 PM

## 2012-09-02 NOTE — Discharge Summary (Signed)
Physician Discharge Summary  Brittany [name] ZOX:[id number] DOB: Jul 11, 1950 DOA: 08/30/2012  PCP: Carmin Richmond, MD  Admit date: 08/30/2012 Discharge date: 09/02/2012  Time spent: *40* minutes  Recommendations for Outpatient Follow-up:   Followup with primary care physician primary gastroenterologist.  Discharge Diagnoses:  Active Problems:   TOBACCO ABUSE   Chronic airway obstruction, not elsewhere classified   G E REFLUX   Upper GI bleed   Left-sided weakness   Discharge Condition: Stable  Diet recommendation: Regular diet, patient advised to avoid acidic food  Filed Weights   08/31/12 0211  Weight: 58.6 kg (129 lb 3 oz)    History of present illness:  Brittany [name] is an 62 y.o. female with history of GERD, status post capsule enteroscopy with pH monitoring, (result unavailable at this time in Epic), hypothyroidism, hyperlipidemia, anxiety, history of anemia status post hysterectomy and cholecystectomy, presents to the emergency room complaining of abdominal discomfort and black stool. She is currently not on any iron supplement. She denied any recent use of Pepto-Bismol. She denied any lightheadedness, chest pain, shortness of breath. She also has.vomitus. There has been no hematochezia nor hematemesis. Evaluation in emergency room included a hemoglobin of 10.9 g per decaliter (about 1 g per decaliter lower than in January 2040) she is guaiac positive, she has normal renal function tests and BUN was only 9. Hospitalist was asked to admit her for possible upper GI bleed. She remained hemodynamically stable. Her gastroenterologist was Dr. Jennings Books Course:   1. Severe GERD with coffee-ground emesis: As mentioned above patient reports coffee-ground emesis at the time of admission, patient has stable hemoglobin and stable vitals. Her Synthroid was consulted, EGD was done by gastroenterology and 08/31/2012 which showed hiatal hernia and erythematous mucosa, there is no  evidence of bleeding. GI recommended to continue PPI daily at home, followup with primary gastroenterologist Dr. Bosie Clos as outpatient.  2. Altered mental status: On 09/01/2012 patient was about to be discharged from the hospital, but she did develop and responsiveness altered mental status. Rapid response team was called, patient did have normal blood pressure and pulse but she was not able to respond to verbal stimuli. Nurse reported that she had fentanyl 25 mg about 30 minutes before the event, patient received Narcan and she became responsive immediately, the unresponsiveness is likely secondary to narcotics. After the incident patient was complaining of left-sided weakness, I consulted neurology to evaluate the patient, MRI of the brain showed no evidence of abnormalities, neurology question the numbness/weakness psychogenic after her unresponsive episode. Patient today is doing very well, she walked around, she ate her food and she is wide awake and alert.  3. Abdominal pain: Of unclear etiology, patient is significantly tender to palpation in the central abdomen and epigastric area, CT scan of abdomen pelvis negative for acute abnormalities as well as EGD showed no abnormalities. Patient claimed her pain is better prior to discharge.  4. Anxiety: Patient is on citalopram, clorazepate and trazodone at home, she was still have significant anxiety was in the hospital and she needed a lot of feedback to stay calm.  5. Anemia: Normocytic anemia, patient has positive stool Hemoccult in the ED, hemoglobin remains relatively stable her baseline is 11-12 and she stayed around 10.9. As mentioned above the EGD showed no evidence of abnormalities or bleeding, as no further "coffee-ground emesis" or melena. Per GI recommendation followup as outpatient.  Procedures:  EGD done by Dr.Ganem , Showed hiatal hernia and erythematous mucosa in  the antrum no evidence of ulcer or active  bleeding.  Consultations:  [facility]  Discharge Exam: Filed Vitals:   09/01/12 0902 09/01/12 1300 09/01/12 2029 09/02/12 0543  BP: 146/85 122/70 101/59 114/74  Pulse: 80 69 68 73  Temp:  98.2 F (36.8 C) 98.2 F (36.8 C) 98.4 F (36.9 C)  TempSrc:  Oral  Oral  Resp: 20 16 18 16   Height:      Weight:      SpO2: 100% 96% 97% 97%   General: Alert and awake, oriented x3, not in any acute distress. HEENT: anicteric sclera, pupils reactive to light and accommodation, EOMI CVS: S1-S2 clear, no murmur rubs or gallops Chest: clear to auscultation bilaterally, no wheezing, rales or rhonchi Abdomen: soft nontender, nondistended, normal bowel sounds, no organomegaly Extremities: no cyanosis, clubbing or edema noted bilaterally Neuro: Cranial nerves II-XII intact, no focal neurological deficits  Discharge Instructions     Medication List    TAKE these medications       aluminum hydroxide-magnesium carbonate 95-358 MG/15ML Susp  Commonly known as:  GAVISCON  Take 15 mLs by mouth every 6 (six) hours as needed (for gas).     aspirin EC 81 MG tablet  Take 81 mg by mouth every evening.     calcium carbonate 600 MG Tabs  Commonly known as:  OS-CAL  Take 600 mg by mouth 2 (two) times daily with a meal.     citalopram 40 MG tablet  Commonly known as:  CELEXA  Take 40 mg by mouth every morning.     clorazepate 7.5 MG tablet  Commonly known as:  TRANXENE  Take 4 mg by mouth at bedtime as needed for anxiety or sleep. Anxiety     levothyroxine 25 MCG tablet  Commonly known as:  SYNTHROID, LEVOTHROID  Take 25 mcg by mouth every morning.     loratadine 10 MG tablet  Commonly known as:  CLARITIN  Take 10 mg by mouth at bedtime.     pantoprazole 40 MG tablet  Commonly known as:  PROTONIX  Take 1 tablet (40 mg total) by mouth daily.     traZODone 50 MG tablet  Commonly known as:  DESYREL  Take 50 mg by mouth at bedtime.     WOMENS 50+ MULTI VITAMIN/MIN PO  Take  1 tablet by mouth daily.           Follow-up Information   Follow up with DAVIS,JAMES W, MD In 1 week.   Contact information:   [address] [place] [address] [phone]       Follow up with Shirley Friar., MD In 2 weeks.   Contact information:   [address] AND [address] [place] [address] [phone]        The results of significant diagnostics from this hospitalization (including imaging, microbiology, ancillary and laboratory) are listed below for reference.    Significant Diagnostic Studies: Ct Head Wo Contrast  09/01/2012  *RADIOLOGY REPORT*  Clinical Data: Headache with vomiting status post recent fall.  CT HEAD WITHOUT CONTRAST  Technique:  Contiguous axial images were obtained from the base of the skull through the vertex without contrast.  Comparison: Head CT 12/13/2011.  Findings: [name] head is tilted within the CT gantry. Reformatted images were generated to partially correct for this. There is mild motion artifact.  Ill-defined increased density within the left lentiform nucleus on image 15 is probably artifactual.  There is no abnormality on the adjacent images or on the reformatted images.  I do not see any evidence of acute intracranial hemorrhage, mass lesion, brain edema or extra-axial fluid collection.  The ventricles and subarachnoid spaces appear appropriately sized for age.  There is minimal ethmoid sinus mucosal thickening without air fluid levels.  The mastoids and middle ears are clear.  No calvarial fracture is identified.  IMPRESSION: Stable examination.  No acute intracranial or calvarial findings identified.   Original Report Authenticated By: Carey Bullocks, M.D.    Mr Brain Wo Contrast  09/01/2012  *RADIOLOGY REPORT*  Clinical Data: Headache.  Obtunded.  Left arm and leg numbness  MRI HEAD WITHOUT CONTRAST  Technique:  Multiplanar, multiecho pulse sequences of the brain and surrounding  structures were obtained according to standard protocol without intravenous contrast.  Comparison: CT head 09/01/2012  Findings: Negative for acute infarct.  No significant chronic ischemia.  Ventricle size is normal.  Brainstem is normal.  Negative for hemorrhage or mass lesion.  No fluid collection or midline shift.  Mild mucosal edema in the paranasal sinuses.  IMPRESSION: No significant intracranial abnormality.   Original Report Authenticated By: Janeece Riggers, M.D.    Ct Abdomen Pelvis W Contrast  08/31/2012  *RADIOLOGY REPORT*  Clinical Data: Abdominal pain with nausea, vomiting, and diarrhea. Dark stools.  Dark emesis.  CT ABDOMEN AND PELVIS WITH CONTRAST  Technique:  Multidetector CT imaging of the abdomen and pelvis was performed following the standard protocol during bolus administration of intravenous contrast.  Contrast: OMNIPAQUE IOHEXOL 300 MG/ML  SOLN  Comparison: CT scan dated 12/29/2010 and radiographs dated 08/30/2012  Findings: There is a moderate hiatal hernia.  The bowel is otherwise normal including the terminal ileum and appendix.  Cecum lies low in the right side of the pelvis as does the appendix. Uterus and ovaries and gallbladder have been removed.  Liver, spleen, pancreas, adrenal glands, bile ducts, and kidneys are normal except for two tiny cysts on the lower pole of the left kidney and a small stone in the lower pole of the left kidney.  IMPRESSION: No acute abnormalities.  Hiatal hernia.  Single stone in the lower pole of the left kidney.   Original Report Authenticated By: Francene Boyers, M.D.    Dg Abd Acute W/chest  08/30/2012  *RADIOLOGY REPORT*  Clinical Data: Generalized abdominal pain and vomiting.  ACUTE ABDOMEN SERIES (ABDOMEN 2 VIEW & CHEST 1 VIEW)  Comparison: Chest radiograph performed 06/02/2012, and CT of the chest, abdomen and pelvis performed 02/10/2011  Findings: The lungs are well-aerated.  Residual mild right apical and midlung airspace opacity may reflect  scarring, or possibly mild recurrent infection.  This is noted in a similar distribution to the prior study.  There is no evidence of pleural effusion or pneumothorax.  The cardiomediastinal silhouette is within normal limits.  The visualized bowel gas pattern is unremarkable.  Scattered stool and air are seen within the colon; there is no evidence of small bowel dilatation to suggest obstruction.  No free intra-abdominal air is identified on the provided upright view.  No acute osseous abnormalities are seen; the sacroiliac joints are unremarkable in appearance.  Cervical spinal fusion hardware is noted.  Clips are noted within the right upper quadrant, reflecting prior cholecystectomy.  IMPRESSION:  1.  Unremarkable bowel gas pattern; no free intra-abdominal air seen.  Moderate amount of stool noted in the colon. 2.  Residual mild right apical and midlung airspace opacity may reflect scarring,  or possibly mild recurrent pneumonia.  This is seen in a similar distribution to the prior study from January.   Original Report Authenticated By: Tonia Ghent, M.D.    Dg Abd Portable 2v  09/01/2012  *RADIOLOGY REPORT*  Clinical Data: Abdominal pain.  Question free intraperitoneal air.  PORTABLE ABDOMEN - 2 VIEW  Comparison: CT abdomen and pelvis 08/30/2012.  Findings: No free intraperitoneal air is identified.  Contrast material from the patient's CT scan is seen throughout the colon. No evidence of bowel obstruction.  IMPRESSION: No acute finding.   Original Report Authenticated By: Holley Dexter, M.D.     Microbiology: No results found for this or any previous visit (from the past 240 hour(s)).   Labs: Basic Metabolic Panel:  Recent Labs Lab 08/30/12 2038 09/01/12 0955  NA 142 143  K 3.9 4.0  CL 108 110  CO2 28 23  GLUCOSE 82 105*  BUN 9 4*  CREATININE 0.90 0.83  CALCIUM 9.1 9.2   Liver Function Tests:  Recent Labs Lab 08/30/12 2038  AST 21  ALT 12  ALKPHOS 86  BILITOT 0.1*  PROT 6.7   ALBUMIN 3.3*    Recent Labs Lab 08/30/12 2038  LIPASE 36   No results found for this basename: AMMONIA,  in the last 168 hours CBC:  Recent Labs Lab 08/30/12 2038 08/31/12 0315 08/31/12 0755 08/31/12 1311 08/31/12 1925 09/01/12 0955  WBC 5.8 6.1 4.8 4.8 5.9 6.3  NEUTROABS 1.6*  --   --   --   --   --   HGB 9.9* 10.0* 10.2* 9.8* 9.8* 10.9*  HCT 30.5* 30.2* 30.2* 30.4* 30.6* 33.2*  MCV 90.5 90.1 89.3 91.6 91.3 90.0  PLT 206 182 182 183 194 206   Cardiac Enzymes:  Recent Labs Lab 08/30/12 2028  TROPONINI <0.30   BNP: BNP (last 3 results) No results found for this basename: PROBNP,  in the last 8760 hours CBG:  Recent Labs Lab 09/01/12 0856 09/01/12 1137  GLUCAP 108* 104*       Signed:  Pura Picinich A  [facility] 09/02/2012, 12:08 PM

## 2012-09-02 NOTE — Evaluation (Signed)
Physical Therapy Evaluation Patient Details Name: Brittany [name] MRN: [id number] DOB: 09/09/1950 Today's Date: 09/02/2012 Time: 4098-1191 PT Time Calculation (min): 19 min  PT Assessment / Plan / Recommendation Clinical Impression  Patient is a 61 yo female admitted with GIB and weakness.  Patient independent with mobility/gait with good balance.  No acute PT needs identified - PT will sign off.    PT Assessment  Patent does not need any further PT services    Follow Up Recommendations  No PT follow up;Supervision - Intermittent    Does the patient have the potential to tolerate intense rehabilitation      Barriers to Discharge        Equipment Recommendations  None recommended by PT    Recommendations for Other Services     Frequency      Precautions / Restrictions Precautions Precautions: None Restrictions Weight Bearing Restrictions: No   Pertinent Vitals/Pain       Mobility  Bed Mobility Bed Mobility: Not assessed Transfers Transfers: Sit to Stand;Stand to Sit Sit to Stand: 7: Independent;With armrests;From chair/3-in-1 Stand to Sit: 7: Independent;With armrests;To chair/3-in-1 Ambulation/Gait Ambulation/Gait Assistance: 7: Independent Ambulation Distance (Feet): 200 Feet Assistive device: None Ambulation/Gait Assistance Details: Good gait pattern and balance. Gait Pattern: Within Functional Limits Gait velocity: WFL      PT Goals  N/A  Visit Information  Last PT Received On: 09/02/12 Assistance Needed: +1    Subjective Data  Subjective: "My face is still a little numb.  Otherwise I am OK" Patient Stated Goal: To go home soon.   Prior Functioning  Home Living Lives With: Alone Available Help at Discharge: Family;Available PRN/intermittently Prior Function Level of Independence: Independent Able to Take Stairs?: Yes Driving: Yes Communication Communication: No difficulties    Cognition  Cognition Arousal/Alertness: Awake/alert Behavior  During Therapy: WFL for tasks assessed/performed Overall Cognitive Status: Within Functional Limits for tasks assessed    Extremity/Trunk Assessment Right Upper Extremity Assessment RUE ROM/Strength/Tone: WFL for tasks assessed RUE Sensation: WFL - Light Touch Left Upper Extremity Assessment LUE ROM/Strength/Tone: WFL for tasks assessed LUE Sensation: WFL - Light Touch Right Lower Extremity Assessment RLE ROM/Strength/Tone: WFL for tasks assessed RLE Sensation: WFL - Light Touch Left Lower Extremity Assessment LLE ROM/Strength/Tone: WFL for tasks assessed LLE Sensation: WFL - Light Touch   Balance Balance Balance Assessed: Yes High Level Balance High Level Balance Activites: Direction changes;Turns;Sudden stops;Head turns (Stepping over obstacles) High Level Balance Comments: No loss of balance with high level balance activities.  End of Session PT - End of Session Activity Tolerance: Patient tolerated treatment well Patient left: in chair;with call bell/phone within reach;with family/visitor present Nurse Communication: Mobility status  GP Functional Assessment Tool Used: Clinical judgement Functional Limitation: Mobility: Walking and moving around Mobility: Walking and Moving Around Current Status (Y7829): 0 percent impaired, limited or restricted Mobility: Walking and Moving Around Discharge Status [phone]): 0 percent impaired, limited or restricted   Vena Austria 09/02/2012, 2:51 PM Durenda Hurt. Renaldo Fiddler, [facility] Acute Rehab Services Pager [phone]

## 2012-09-05 NOTE — Progress Notes (Signed)
09/01/12 1500  SLP G-Codes **NOT FOR INPATIENT CLASS**  Functional Assessment Tool Used (clinical judgement)  Functional Limitations Swallowing  Swallow Current Status (Z6109) [facility]  Swallow Goal Status (U0454) [facility]  Swallow Discharge Status (U9811) [facility]  SLP Evaluations  $ SLP Speech Visit 1 Procedure  SLP Evaluations  $BSS Swallow 1 Procedure

## 2012-10-26 NOTE — ED Provider Notes (Signed)
[facility]  EMERGENCY DEPARTMENT TREATMENT REPORT  NAME:  Cheryl Paul, Myrla  SEX:   F  ADMIT: 10/25/2012  DOB:   1951-02-01  MR#    [id number]  ROOM:    TIME SEEN: 08 03 AM  ACCT#  [id number]        TIME OF EVALUATION:  2320.    PRIMARY CARE PROVIDER:  Unknown.    CHIEF COMPLAINT:  Productive cough.    HISTORY OF PRESENT ILLNESS:  This is a 62 year old female presenting to the Emergency Department secondary   to cough.  Symptoms have been persistent over the past 3 days.  Cough is   productive with what the patient believes is green-colored sputum.  It is   associated with runny nose, nasal congestion symptoms as well.  The patient   has been taking over-the-counter Mucinex without any relief and due to   symptoms, here for further evaluation.  Otherwise, the patient states that she   only has chest pain when she coughs.  She denies any shortness of breath.    REVIEW OF SYSTEMS:  CONSTITUTIONAL:  No fever.  ENT:  Positive for nasal congestion.  Reports hoarseness of voice but   otherwise denies sore throat.  RESPIRATORY:  Positive cough and shortness of breath.  CARDIOVASCULAR:  Reports chest pain only when she coughs.  No current chest   pain.  GASTROINTESTINAL:  No abdominal pain, vomiting or diarrhea.    PAST MEDICAL HISTORY:  Diabetes, asthma, history of bowel obstructions.  The patient has had a couple   surgeries in the past, hyperlipidemia, hypertension, arthritis, hernia   repair, hysterectomy, tonsillectomy.    SOCIAL HISTORY:   Nonsmoker.    FAMILY HISTORY:  Noncontributory.    MEDICATIONS:  Lisinopril, Glucophage, Proventil and aspirin.    ALLERGIES:  REVIEWED IN IBEX.    PHYSICAL EXAMINATION:  VITAL SIGNS:  Blood pressure 130/104, pulse 75, respirations 22, temperature   98.7, pain 7 out of 10, O2 saturation 100% on room air.  GENERAL:  The patient well nourished, well developed, answering questions   appropriately, no acute distress.   HEENT:  Eyes:  Conjunctivae clear, lids normal.  Pupils equal, symmetrical and   normally reactive.  ENT:  Internal examination of the ears bilaterally   unremarkable.  TMs clear.  No bulging or erythema.  Mouth:  Mucous membranes   moist.  Pharynx nonerythematous.  LYMPHATICS:  No cervical or submandibular lymphadenopathy palpated.   RESPIRATORY:  The patient does have faint expiratory wheezing in all lung   fields.  She is in no acute respiratory distress, not tachypneic.  CARDIOVASCULAR:  Regular rate and rhythm, no murmurs are appreciated.  CHEST:  Chest wall symmetric, no masses, no tenderness.  DP pulses 2+ and   equal bilaterally.  No peripheral edema or significant varicosities.    GASTROINTESTINAL:  Normoactive bowel sounds.  Abdomen soft, nontender.    INITIAL ASSESSMENT AND MANAGEMENT PLAN:  A 62 year old female here secondary to cough.  I believe that she has likely   viral URI, bronchitis.  However, she does state that she has chest pain when   she coughs.  Due to her history of diabetes, hypertension, we will obtain   screening EKG as well as chest x-ray to ensure that there is no acute   infiltrate.    DIAGNOSTIC  INTERPRETATIONS:  Dr. Luciano Cutter did not see any acute ST segment or T-wave abnormalities that are   consistent with acute ischemia or infarction.  Chest x-ray as read by Dr.   Luciano Cutter revealed no evidence of acute infiltrate.      ER COURSE:   The patient stable while here, received numerous breathing treatments while   here.  Reassessment of the patient's lungs revealed clear breath sounds.  She   did feel much more comfortable.  Again, I suspect symptoms are secondary to a   viral URI, bronchitis.  The patient will be discharged home with albuterol   inhaler, given Tussionex to help with her cough symptoms, instructions to   follow up with her doctor, return for any new or worsening symptoms including   fevers, chest pain, difficulty breathing.    CLINICAL IMPRESSION, DIAGNOSIS:   Acute bronchitis.    DISPOSITION:  The patient discharged home with instructions as above.      The patient was personally evaluated by myself and Dr. Luciano Cutter who agrees with   the above assessment and plan.      ___________________  Candace Cruise MD  Dictated By: Kristeen Mans, PA    My signature above authenticates this document and my orders, the final   diagnosis (es), discharge prescription (s), and instructions in the [facility]   [facility] record.    If you have any questions please contact [phone].      D:10/26/2012  T: 10/26/2012 14:00:56  [id number]  Authenticated by Lyman Speller. Luciano Cutter, M.D. On 11/26/2012 11:09:02 AM

## 2013-03-23 ENCOUNTER — Other Ambulatory Visit: Payer: Self-pay

## 2013-11-14 NOTE — Progress Notes (Signed)
Pt is here to establish care. Diabetes, HTN, Chol, arthriits, asthma,     Do you have an advance directive no  Do you want information on an advance directive yes    Pt  mychart is active.    1. Have you been to the ER, urgent care clinic since your last visit?  Hospitalized since your last visit?No    2. Have you seen or consulted any other health care providers outside of the [facility] since your last visit?  Include any pap smears or colon screening. No

## 2013-11-14 NOTE — Patient Instructions (Addendum)
Preventing Falls: After Your Visit  Your Care Instructions  Getting around your home safely can be a challenge if you have injuries or health problems that make it easy for you to fall. Loose rugs and furniture in walkways are among the dangers for many older people who have problems walking or who have poor eyesight. People who have conditions such as arthritis, osteoporosis, or dementia also have to be careful not to fall.  You can make your home safer with a few simple measures.  Follow-up care is a key part of your treatment and safety. Be sure to make and go to all appointments, and call your doctor if you are having problems. It's also a good idea to know your test results and keep a list of the medicines you take.  How can you care for yourself at home?  Taking care of yourself  ?? You may get dizzy if you do not drink enough water. To prevent dehydration, drink plenty of fluids, enough so that your urine is light yellow or clear like water. Choose water and other caffeine-free clear liquids. If you have kidney, heart, or liver disease and have to limit fluids, talk with your doctor before you increase the amount of fluids you drink.  ?? Exercise regularly to improve your strength, muscle tone, and balance. Walk if you can. Swimming may be a good choice if you cannot walk easily.  ?? Have your vision and hearing checked each year or any time you notice a change. If you have trouble seeing and hearing, you might not be able to avoid objects and could lose your balance.  ?? Know the side effects of the medicines you take. Ask your doctor or pharmacist whether the medicines you take can affect your balance. Sleeping pills or sedatives can affect your balance.  ?? Limit the amount of alcohol you drink. Alcohol can impair your balance and other senses.  ?? Ask your doctor whether calluses or corns on your feet need to be removed. If you wear loose-fitting shoes because of calluses or corns, you  can lose your balance and fall.  ?? Talk to your doctor if you have numbness in your feet.  Preventing falls at home  ?? Remove raised doorway thresholds, throw rugs, and clutter. Repair loose carpet or raised areas in the floor.  ?? Move furniture and electrical cords to keep them out of walking paths.  ?? Use nonskid floor wax, and wipe up spills right away, especially on ceramic tile floors.  ?? If you use a walker or cane, put rubber tips on it. If you use crutches, clean the bottoms of them regularly with an abrasive pad, such as steel wool.  ?? Keep your house well lit, especially stairways, porches, and outside walkways. Use night-lights in areas such as hallways and bathrooms. Add extra light switches or use remote switches (such as switches that go on or off when you clap your hands) to make it easier to turn lights on if you have to get up during the night.  ?? Install sturdy handrails on stairways.  ?? Move items in your cabinets so that the things you use a lot are on the lower shelves (about waist level).  ?? Keep a cordless phone and a flashlight with new batteries by your bed. If possible, put a phone in each of the main rooms of your house, or carry a cell phone in case you fall and cannot reach a phone. Or, you can wear   a device around your neck or wrist. You push a button that sends a signal for help.  ?? Wear low-heeled shoes that fit well and give your feet good support. Use footwear with nonskid soles. Check the heels and soles of your shoes for wear. Repair or replace worn heels or soles.  ?? Do not wear socks without shoes on wood floors.  ?? Walk on the grass when the sidewalks are slippery. If you live in an area that gets snow and ice in the winter, sprinkle salt on slippery steps and sidewalks.  Preventing falls in the bath  ?? Install grab bars and nonskid mats inside and outside your shower or tub and near the toilet and sinks.  ?? Use shower chairs and bath benches.   ?? Use a hand-held shower head that will allow you to sit while showering.  ?? Get into a tub or shower by putting the weaker leg in first. Get out of a tub or shower with your strong side first.  ?? Repair loose toilet seats and consider installing a raised toilet seat to make getting on and off the toilet easier.  ?? Keep your bathroom door unlocked while you are in the shower.   Where can you learn more?   Go to http://www.healthwise.net/[facility]  Enter G117 in the search box to learn more about "Preventing Falls: After Your Visit."   ?? 2006-2015 Healthwise, Incorporated. Care instructions adapted under license by [employer] (which disclaims liability or warranty for this information). This care instruction is for use with your licensed healthcare professional. If you have questions about a medical condition or this instruction, always ask your healthcare professional. Healthwise, Incorporated disclaims any warranty or liability for your use of this information.  Content Version: 10.5.422740; Current as of: March 31, 2013              Preventing Outdoor Falls: After Your Visit  Your Care Instructions  Worries about falls don't need to keep you indoors. Walking and other types of exercise can improve your fitness, strength, and balance. And this helps to lower your risk of falling. You will need to watch your step and learn a few safety measures.  If you are worried about having a fall outdoors, ask your doctor about exercises, classes, or physical therapy that may help. You can learn ways to gain strength, flexibility, and balance. Ask if it might help to use a cane or walker.  You can make your time outdoors safer with a few simple measures.  Follow-up care is a key part of your treatment and safety. Be sure to make and go to all appointments, and call your doctor if you are having problems. It's also a good idea to know your test results and keep a list of the medicines you take.   How can you prevent falls outdoors?  ?? Wear shoes with firm soles and low heels. If you have to walk on an icy surface, use grippers that can be worn over your shoes in bad weather.  ?? Be extra careful if weather is bad. Walk on the grass when the sidewalks are slick. If you live in a place that gets snow and ice in the winter, sprinkle salt on slippery stairs and sidewalks.  ?? Be careful getting on or off buses and trains or getting in and out of cars. If handrails are available, use them.  ?? Be careful when you cross the street. Look for crosswalks or places where curb   cuts or ramps are present.  ?? Try not to hurry, especially if you are carrying something.  ?? Be cautious in parking lots or garages. There may be curbs or changes in pavement, or the height of the pavement may vary.  ?? Make sure to wear the correct eyeglasses, if you need them. Reading glasses or bifocals can make it harder to see hazards that might be in your way.  ?? If you are walking outdoors for exercise, try to:  ?? Walk in well-lighted, well-maintained areas. These include high school or college tracks, shopping malls, and public spaces.  ?? Walk with a partner.  ?? Watch out for cracked sidewalks, curbs, changes in the height of the pavement, exposed tree roots, and debris such as fallen leaves or branches.   Where can you learn more?   Go to Metropolitan[employer].huhttp://www.healthwise.net/[facility]  Enter U018 in the search box to learn more about "Preventing Outdoor Falls: After Your Visit."   ?? 2006-2015 Healthwise, Incorporated. Care instructions adapted under license by [employer] (which disclaims liability or warranty for this information). This care instruction is for use with your licensed healthcare professional. If you have questions about a medical condition or this instruction, always ask your healthcare professional. Healthwise, Incorporated disclaims any warranty or liability for your use of this information.   Content Version: 10.5.422740; Current as of: March 31, 2013      Follow-up Disposition:   Return if symptoms worsen or fail to improve.   Risk and benefits of new medication discussed in detail, patient was given the opportunity to ask questions  AVS provided  reviewed diet, exercise and weight control   Alarm signals discussed. ER precautions   Plan of care reviewed with patient. Understanding verbalized and they are in agreement with plan of care.     Baruch Goldmanneresa R Johnson, DO

## 2013-11-15 NOTE — Telephone Encounter (Signed)
Opened in error

## 2013-11-15 NOTE — Progress Notes (Signed)
HISTORY OF PRESENT ILLNESS  Cheryl Paul is a 63 y.o. female.  HPI Comments: 63 year old female new patient in today to establish.    -Patient in today with complaint of joint pain from arthritis and incontinence.    -Here previous doctor's office is closed, she has her paper chart with her.  -She reports hx of COPD and Sleep apnea, she does not use her Cpap and says she probably needs a new one.  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  She reports hx of chronic sinus infections and bronchitis  -She was last seen in feb and had blood work including an A1c which was 8.1,   -She reports chronic pain in her b/l knee at 8/10 and shoulders 9/10, she is on percocet previously provided by ortho.  She also reports neck pain with radiation into shoulders.  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    Allergies   Allergen Reactions   ??? Iodine Rash   ??? Lactose Diarrhea     Lactose intolerance       Past Medical History   Diagnosis Date   ??? Asthma    ??? Diabetes (HCC)    ??? Hypercholesterolemia    ??? Hypertension    ??? H/O sinusitis    ??? Arthritis      knee, arms,        Family History   Problem Relation Age of Onset   ??? Hypertension Mother    ??? Stroke Mother    ??? Diabetes Mother    ??? Thyroid Disease Mother    ??? Arthritis-rheumatoid Mother    ??? Cancer Father      colon polpe       History   Substance Use Topics   ??? Smoking status: Never Smoker    ??? Smokeless tobacco: Never Used   ??? Alcohol Use: No        Current Outpatient Prescriptions   Medication Sig   ??? glipiZIDE (GLUCOTROL) 5 mg tablet Take  by mouth two (2) times a day.   ??? clotrimazole-betamethasone (LOTRISONE) 1-0.05 % lotion Apply  to affected area two (2) times a day.   ??? albuterol (VENTOLIN HFA) 90 mcg/actuation inhaler Take  by inhalation.   ??? oxyCODONE-acetaminophen (PERCOCET) 5-325 mg per tablet Take 1 Tab by  mouth every four (4) hours as needed for Pain.   ??? lisinopril (PRINIVIL, ZESTRIL) 10 mg tablet Take  by mouth daily.     No current facility-administered medications for this visit.        Past Surgical History   Procedure Laterality Date   ??? Pr abdomen surgery proc unlisted       4 surguries for blockages   ??? Hx hernia repair       abdomen   ??? Hx cholecystectomy     ??? Hx gyn     ??? Hx hysterectomy       tubes tied   ??? Hx carpal tunnel release Bilateral        Review of Systems   Constitutional: Negative for fever and chills.   HENT: Positive for congestion.    Respiratory: Positive for shortness of breath.         DOE   Cardiovascular: Positive for orthopnea.   Gastrointestinal:        C/o  early satiety and N/V when eating certain foods   Genitourinary:        C/o frequency, nocturia, urge incontenance   Musculoskeletal: Positive for back pain, joint pain and neck pain.        C/o morning stiffness, Muscle cramps, calf pain w/ walking.  Walks with walker   Skin: Positive for rash.        In folds of skin, she uses a cream that helps   Neurological: Positive for tingling.        C/o numbness   Endo/Heme/Allergies: Positive for polydipsia.     BP 138/83 mmHg   Pulse 84   Temp(Src) 98 ??F (36.7 ??C) (Oral)   Resp 18   Ht 5\' 3"  (1.6 m)   Wt 293 lb (132.904 kg)   BMI 51.92 kg/m2   SpO2 99%     Physical Exam   Constitutional: She is oriented to person, place, and time.   Morbidly obese female, antalgic gait, uses walker   HENT:   Head: Normocephalic and atraumatic.   Eyes: Conjunctivae and EOM are normal.   Neck: Normal range of motion.   Cardiovascular: Normal rate, regular rhythm, normal heart sounds and intact distal pulses.    No murmur heard.  Pulmonary/Chest: Effort normal and breath sounds normal. No respiratory distress.   Abdominal: Soft.   Musculoskeletal: She exhibits no edema or tenderness.        Right knee: She exhibits decreased range of motion.        Left knee: She exhibits decreased range of motion.    Neurological: She is alert and oriented to person, place, and time. No cranial nerve deficit.   Psychiatric: She has a normal mood and affect. Her behavior is normal. Judgment and thought content normal.       ASSESSMENT and PLAN    ICD-9-CM    1. Type 2 diabetes mellitus without complication (HCC) 250.00 HEMOGLOBIN A1C     CBC W/O DIFF     URINALYSIS W/MICROSCOPIC     [id number] [facility] DRAW FEE     [id number] - [facility] COLLECTION   2. Incontinence in female 625.6    3. Candidal intertrigo 112.3    4. Arthritis, multiple joint involvement 716.99    5. Essential hypertension 401.9 METABOLIC PANEL, COMPREHENSIVE     MICROALBUMIN:CREATININE RATIO, RANDOM URINE     CBC W/O DIFF     URINALYSIS W/MICROSCOPIC   6. COPD (chronic obstructive pulmonary disease) (HCC) 496 REFERRAL TO PULMONARY DISEASE   7. Hyperlipidemia 272.4 LIPID PANEL     CBC W/O DIFF   8. Chronic sinusitis, unspecified location 473.9    9. Encounter for vitamin deficiency screening V77.99 VITAMIN D, 25 HYDROXY     -Reviewed old records  -Advised close follow up for chronic medical conditions  -Labs for CVD risk stratification and treatment planning.  HM to be addressed include mammogram, Hep C screen, DM foot exam, colonoscopy, pap etc.  Will continue to review old records for any past results on any HM that may have been done   -RTC in 2 weeks    Follow-up Disposition:   Return if symptoms worsen or fail to improve.   Risk and benefits of new medication discussed in detail, patient was given the opportunity to ask questions  AVS provided  reviewed diet, exercise and weight control   Alarm signals discussed. ER precautions   Plan of care reviewed with patient. Understanding verbalized and they are in agreement with plan  of care.     Lemont Fillers, DO

## 2013-11-20 LAB — CBC W/O DIFF
HCT: 42.3 % (ref 34.0–46.6)
HGB: 13.6 g/dL (ref 11.1–15.9)
MCH: 27.1 pg (ref 26.6–33.0)
MCHC: 32.2 g/dL (ref 31.5–35.7)
MCV: 84 fL (ref 79–97)
PLATELET: 189 10*3/uL (ref 150–379)
RBC: 5.01 x10E6/uL (ref 3.77–5.28)
RDW: 13.8 % (ref 12.3–15.4)
WBC: 7.6 10*3/uL (ref 3.4–10.8)

## 2013-11-20 LAB — METABOLIC PANEL, COMPREHENSIVE
A-G Ratio: 2 (ref 1.1–2.5)
ALT (SGPT): 14 IU/L (ref 0–32)
AST (SGOT): 16 IU/L (ref 0–40)
Albumin: 4.5 g/dL (ref 3.6–4.8)
Alk. phosphatase: 60 IU/L (ref 39–117)
BUN/Creatinine ratio: 15 (ref 11–26)
BUN: 11 mg/dL (ref 8–27)
Bilirubin, total: 0.3 mg/dL (ref 0.0–1.2)
CO2: 22 mmol/L (ref 18–29)
Calcium: 9.6 mg/dL (ref 8.7–10.3)
Chloride: 99 mmol/L (ref 97–108)
Creatinine: 0.73 mg/dL (ref 0.57–1.00)
GFR est AA: 102 mL/min/{1.73_m2} (ref >59)
GFR est non-AA: 89 mL/min/{1.73_m2} (ref >59)
GLOBULIN, TOTAL: 2.2 g/dL (ref 1.5–4.5)
Glucose: 214 mg/dL — ABNORMAL HIGH (ref 65–99)
Potassium: 4.6 mmol/L (ref 3.5–5.2)
Protein, total: 6.7 g/dL (ref 6.0–8.5)
Sodium: 141 mmol/L (ref 134–144)

## 2013-11-20 LAB — URINALYSIS W/MICROSCOPIC
Bilirubin: NEGATIVE
Blood: NEGATIVE
Glucose: NEGATIVE
Ketone: NEGATIVE
Leukocyte Esterase: NEGATIVE
Nitrites: NEGATIVE
Protein: NEGATIVE
Specific Gravity: 1.019 (ref 1.005–1.030)
Urobilinogen: 0.2 mg/dL (ref 0.0–1.9)
pH (UA): 5.5 (ref 5.0–7.5)

## 2013-11-20 LAB — LIPID PANEL
Cholesterol, total: 225 mg/dL — ABNORMAL HIGH (ref 100–199)
HDL Cholesterol: 47 mg/dL (ref >39)
LDL, calculated: 125 mg/dL — ABNORMAL HIGH (ref 0–99)
Triglyceride: 265 mg/dL — ABNORMAL HIGH (ref 0–149)
VLDL, calculated: 53 mg/dL — ABNORMAL HIGH (ref 5–40)

## 2013-11-20 LAB — MICROALBUMIN:CREATININE RATIO, RANDOM URINE
Creatinine, urine random: 131.9 mg/dL (ref 15.0–278.0)
Microalb/Creat ratio (ug/mg creat.): 3.1 mg/g creat (ref 0.0–30.0)
Microalbumin, urine: 4.1 ug/mL (ref 0.0–17.0)

## 2013-11-20 LAB — MICROSCOPIC EXAMINATION

## 2013-11-20 LAB — CVD REPORT

## 2013-11-20 LAB — HEMOGLOBIN A1C WITH EAG: Hemoglobin A1c: 10.7 % — ABNORMAL HIGH (ref 4.8–5.6)

## 2013-11-21 NOTE — Telephone Encounter (Signed)
Called patient and discussed significantly abnormal lipid panel and A1c.  Asked patient to schedule an appointment to see me in the next few days for a medication adjustment.  Patient agreed.

## 2013-11-21 NOTE — Telephone Encounter (Signed)
Patient request return call re recent lab results.    Also said the weight loss program you discussed is not available at this time (they are postponed until Sept or Oct)

## 2013-11-22 MED ORDER — METFORMIN 1,000 MG TAB
1000 mg | ORAL_TABLET | Freq: Two times a day (BID) | ORAL | Status: DC
Start: 2013-11-22 — End: 2014-07-23

## 2013-11-22 MED ORDER — ATORVASTATIN 20 MG TAB
20 mg | ORAL_TABLET | Freq: Every day | ORAL | Status: DC
Start: 2013-11-22 — End: 2014-04-16

## 2013-11-22 MED ORDER — CANAGLIFLOZIN 100 MG TABLET
100 mg | ORAL_TABLET | Freq: Every day | ORAL | Status: DC
Start: 2013-11-22 — End: 2013-12-05

## 2013-11-22 NOTE — Progress Notes (Signed)
Attempted to contact patient for Diabetes medication management. Hgb A1C 10.7. LVM

## 2013-11-22 NOTE — Patient Instructions (Addendum)
Learning About Diabetes Food Guidelines  Your Care Instructions  Meal planning is important to manage diabetes. It helps keep your blood sugar at a target level (which you set with your doctor). You don't have to eat special foods. You can eat what your family eats, including sweets once in a while. But you do have to pay attention to how often you eat and how much you eat of certain foods.  You may want to work with a dietitian or a certified diabetes educator (CDE) to help you plan meals and snacks. A dietitian or CDE can also help you lose weight if that is one of your goals.  What should you know about eating carbs?  Managing the amount of carbohydrate (carbs) you eat is an important part of healthy meals when you have diabetes. Carbohydrate is found in many foods.  ?? Learn which foods have carbs. And learn the amounts of carbs in different foods.  ?? Bread, cereal, pasta, and rice have about 15 grams of carbs in a serving. A serving is 1 slice of bread (1 ounce), ?? cup of cooked cereal, or 1/3 cup of cooked pasta or rice.  ?? Fruits have 15 grams of carbs in a serving. A serving is 1 small fresh fruit, such as an apple or orange; ?? of a banana; ?? cup of cooked or canned fruit; ?? cup of fruit juice; 1 cup of melon or raspberries; or 2 tablespoons of dried fruit.  ?? Milk and no-sugar-added yogurt have 15 grams of carbs in a serving. A serving is 1 cup of milk or 2/3 cup of no-sugar-added yogurt.  ?? Starchy vegetables have 15 grams of carbs in a serving. A serving is ?? cup of mashed potatoes or sweet potato; 1 cup winter squash; ?? of a small baked potato; ?? cup of cooked beans; or ?? cup cooked corn or green peas.  ?? Learn how much carbs to eat each day and at each meal. A dietitian or CDE can teach you how to keep track of the amount of carbs you eat. This is called carbohydrate counting.  ?? If you are not sure how to count carbohydrate grams, use the Plate  Method to plan meals. It is a good, quick way to make sure that you have a balanced meal. It also helps you spread carbs throughout the day.  ?? Divide your plate by types of foods. Put non-starchy vegetables on half the plate, meat or other protein food on one-quarter of the plate, and a grain or starchy vegetable in the final quarter of the plate. To this you can add a small piece of fruit and 1 cup of milk or yogurt, depending on how many carbs you are supposed to eat at a meal.  ?? Try to eat about the same amount of carbs at each meal. Do not "save up" your daily allowance of carbs to eat at one meal.  ?? Proteins have very little or no carbs per serving. Examples of proteins are beef, chicken, turkey, fish, eggs, tofu, cheese, cottage cheese, and peanut butter. A serving size of meat is 3 ounces, which is about the size of a deck of cards. Examples of meat substitute serving sizes (equal to 1 ounce of meat) are 1/4 cup of cottage cheese, 1 egg, 1 tablespoon of peanut butter, and ?? cup of tofu.  How can you eat out and still eat healthy?  ?? Learn to estimate the serving sizes of foods that have   carbohydrate. If you measure food at home, it will be easier to estimate the amount in a serving of restaurant food.  ?? If the meal you order has too much carbohydrate (such as potatoes, corn, or baked beans), ask to have a low-carbohydrate food instead. Ask for a salad or green vegetables.  ?? If you use insulin, check your blood sugar before and after eating out to help you plan how much to eat in the future.  ?? If you eat more carbohydrate at a meal than you had planned, take a walk or do other exercise. This will help lower your blood sugar.  What else should you know?  ?? Limit saturated fat, such as the fat from meat and dairy products. This is a healthy choice because people who have diabetes are at higher risk of heart disease. So choose lean cuts of meat and nonfat or low-fat dairy  products. Use olive or canola oil instead of butter or shortening when cooking.  ?? Don't skip meals. Your blood sugar may drop too low if you skip meals and take insulin or certain medicines for diabetes.  ?? Check with your doctor before you drink alcohol. Alcohol can cause your blood sugar to drop too low. Alcohol can also cause a bad reaction if you take certain diabetes medicines.  Follow-up care is a key part of your treatment and safety. Be sure to make and go to all appointments, and call your doctor if you are having problems. It's also a good idea to know your test results and keep a list of the medicines you take.   Where can you learn more?   Go to http://www.healthwise.net/[facility]  Enter I147 in the search box to learn more about "Learning About Diabetes Food Guidelines."   ?? 2006-2015 Healthwise, Incorporated. Care instructions adapted under license by [employer] (which disclaims liability or warranty for this information). This care instruction is for use with your licensed healthcare professional. If you have questions about a medical condition or this instruction, always ask your healthcare professional. Healthwise, Incorporated disclaims any warranty or liability for your use of this information.  Content Version: 10.5.422740; Current as of: June 29, 2013              Nutrition Tips for Diabetes: After Your Visit  Your Care Instructions  A healthy diet is important to manage diabetes. It helps you lose weight (if you need to) and keep it off. It gives you the nutrition and energy your body needs and helps prevent heart disease. But a diet for diabetes does not mean that you have to eat special foods. You can eat what your family eats, including occasional sweets and other favorites. But you do have to pay attention to how often you eat and how much you eat of certain foods. The right plan for you will give you meals that help you keep your blood sugar at healthy levels.   Try to eat a variety of foods and to spread carbohydrate throughout the day. Carbohydrate raises blood sugar higher and more quickly than any other nutrient does. Carbohydrate is found in sugar, breads and cereals, fruit, starchy vegetables such as potatoes and corn, and milk and yogurt.  You may want to work with a dietitian or diabetes educator to help you plan meals and snacks. A dietitian or diabetes educator also can help you lose weight if that is one of your goals. The following tips can help you enjoy your meals and stay   healthy.  Follow-up care is a key part of your treatment and safety. Be sure to make and go to all appointments, and call your doctor if you are having problems. It???s also a good idea to know your test results and keep a list of the medicines you take.  How can you care for yourself at home?  ?? Learn which foods have carbohydrate and how much carbohydrate to eat. A dietitian or diabetes educator can help you learn to keep track of how much carbohydrate you eat.  ?? Spread carbohydrate throughout the day. Eat some carbohydrate at all meals, but do not eat too much at any one time.  ?? Plan meals to include food from all the food groups. These are the food groups and some example portion sizes:  ?? Grains: 1 slice of bread (1 ounce), ?? cup of cooked cereal, and 1/3 cup of cooked pasta or rice. These have about 15 grams of carbohydrate in a serving. Choose whole grains such as whole wheat bread or crackers, oatmeal, and brown rice more often than refined grains.  ?? Fruit: 1 small fresh fruit, such as an apple or orange; ?? of a banana; ?? cup of chopped, cooked, or canned fruit; ?? cup of fruit juice; 1 cup of melon or raspberries; and 2 tablespoons of dried fruit. These have about 15 grams of carbohydrate in a serving.  ?? Dairy: 1 cup of nonfat or low-fat milk and 2/3 cup of plain yogurt. These have about 15 grams of carbohydrate in a serving.   ?? Protein foods: Beef, chicken, turkey, fish, eggs, tofu, cheese, cottage cheese, and peanut butter. A serving size of meat is 3 ounces, which is about the size of a deck of cards. Examples of meat substitute serving sizes (equal to 1 ounce of meat) are 1/4 cup of cottage cheese, 1 egg, 1 tablespoon of peanut butter, and ?? cup of tofu. These have very little or no carbohydrate per serving.  ?? Vegetables: Starchy vegetables such as ?? cup of cooked dried beans, peas, potatoes, or corn have about 15 grams of carbohydrate. Nonstarchy vegetables have very little carbohydrate, such as 1 cup of raw leafy vegetables (such as spinach), ?? cup of other vegetables (cooked or chopped), and 3/4 cup of vegetable juice.  ?? Use the plate format to plan meals. It is a good, quick way to make sure that you have a balanced meal. It also helps you spread carbohydrate throughout the day. You divide your plate by types of foods. Put vegetables on half the plate, meat or meat substitutes on one-quarter of the plate, and a grain or starchy vegetable (such as brown rice or a potato) in the final quarter of the plate. To this you can add a small piece of fruit and 1 cup of milk or yogurt, depending on how much carbohydrate you are supposed to eat at a meal.  ?? Talk to your dietitian or diabetes educator about ways to add limited amounts of sweets into your meal plan. You can eat these foods now and then, as long as you include the amount of carbohydrate they have in your daily carbohydrate allowance.  ?? If you drink alcohol, limit it to no more than 1 drink a day for women and 2 drinks a day for men. If you are pregnant, no amount of alcohol is known to be safe.  ?? Protein, fat, and fiber do not raise blood sugar as much as carbohydrate does. If you eat   a lot of these nutrients in a meal, your blood sugar will rise more slowly than it would otherwise.  ?? Limit saturated fats, such as those from meat and dairy products. Try to  replace it with monounsaturated fat, such as olive oil. This is a healthier choice because people who have diabetes are at higher-than-average risk of heart disease. But use a modest amount of olive oil. A tablespoon of olive oil has 14 grams of fat and 120 calories.  ?? Exercise lowers blood sugar. If you take insulin by shots or pump, you can use less than you would if you were not exercising. Keep in mind that timing matters. If you exercise within 1 hour after a meal, your body may need less insulin for that meal than it would if you exercised 3 hours after the meal. Test your blood sugar to find out how exercise affects your need for insulin.  ?? Exercise on most days of the week. Aim for at least 30 minutes. Exercise helps you stay at a healthy weight and helps your body use insulin. Walking is an easy way to get exercise. Gradually increase the amount you walk every day. You also may want to swim, bike, or do other activities.  When you eat out  ?? Learn to estimate the serving sizes of foods that have carbohydrate. If you measure food at home, it will be easier to estimate the amount in a serving of restaurant food.  ?? If the meal you order has too much carbohydrate (such as potatoes, corn, or baked beans), ask to have a low-carbohydrate food instead. Ask for a salad or green vegetables.  ?? If you use insulin, check your blood sugar before and after eating out to help you plan how much to eat in the future.  ?? If you eat more carbohydrate at a meal than you had planned, take a walk or do other exercise. This will help lower your blood sugar.   Where can you learn more?   Go to [facility]  Enter [phone] in the search box to learn more about "Nutrition Tips for Diabetes: After Your Visit."   ?? 2006-2014 Healthwise, Incorporated. Care instructions adapted under license by [employer] (which disclaims liability or warranty for this  information). This care instruction is for use with your licensed healthcare professional. If you have questions about a medical condition or this instruction, always ask your healthcare professional. Healthwise, Incorporated disclaims any warranty or liability for your use of this information.  Content Version: 10.2.346038; Current as of: October 19, 2012              Hemoglobin A1c: About This Test  What is it?  Hemoglobin A1c is a blood test that checks your average blood sugar level over the past 2 to 3 months. This test also is called a glycohemoglobin test or an A1c test.  Why is this test done?  The A1c test is done to check how well your diabetes has been controlled over the past 2 to 3 months. Your doctor can use this information to adjust your medicine and diabetes treatment, if needed.  How can you prepare for the test?  You do not need to stop eating before you have an A1c test. This test can be done at any time during the day, even after a meal.  What happens during the test?  The health professional taking a sample of your blood will:  ?? Wrap an elastic band around your  upper arm. This makes the veins below the band larger so it is easier to put a needle into the vein.  ?? Clean the needle site with alcohol.  ?? Put the needle into the vein.  ?? Attach a tube to the needle to fill it with blood.  ?? Remove the band from your arm when enough blood is collected.  ?? Put a gauze pad or cotton ball over the needle site as the needle is removed.  ?? Put pressure on the site and then put on a bandage.  What else should you know about the test?  The test result is usually given as a percentage. The normal A1c is less than 5.7%.  The A1c test result also can be used to find your estimated average glucose, or eAG. Your eAG and A1c show the same thing in two different ways. They both help you learn more about your average blood sugar range over the past 2 to 3 months.   Where can you learn more?    Go to [email or web address]http://www.healthwise.net/[facility]  Enter U216 in the search box to learn more about "Hemoglobin A1c: About This Test."   ?? 2006-2015 Healthwise, Incorporated. Care instructions adapted under license by [employer] (which disclaims liability or warranty for this information). This care instruction is for use with your licensed healthcare professional. If you have questions about a medical condition or this instruction, always ask your healthcare professional. Healthwise, Incorporated disclaims any warranty or liability for your use of this information.  Content Version: 10.5.422740; Current as of: October 19, 2012

## 2013-11-22 NOTE — Telephone Encounter (Signed)
Pt states the neurosurgeon gave Pt of percocet and she is no longer seeing him. Also states her PCP gave her a lower dose of percocet but he is no longer practicing.  Pt made aware  she would need to discuss getting pain medication for various joint pains before Dr Laural BenesJohnson will prescribe it, per verbral order of Dr Laural BenesJohnson.

## 2013-11-22 NOTE — Progress Notes (Signed)
Pt is here for  follow up on diabetes & discuss labs.    Do you have an advance directive no  Do you want information on an advance directive no    Pt  mychart is active.      1. Have you been to the ER, urgent care clinic since your last visit?  Hospitalized since your last visit?No    2. Have you seen or consulted any other health care providers outside of the [facility] since your last visit?  Include any pap smears or colon screening. No

## 2013-11-22 NOTE — Progress Notes (Signed)
Discussed with patient about adding an additional medication to DM management regimen to lower Hgb A1C. Patient understands that a Hgb A1C of 10.7 is high and needs to be lowered with medication, diet and exercise. Patient understands what Hgb A1C is and what BS range she has been in for the last 2-78months. New medication will be sent to pharmacy. Patient had discussion with Dr. Laural Benes earlier today at OV. Patient encouraged to notify our office if she has any questions, concerns, side effects, etc. Patient verbalizes an understanding. Next OV 12/06/13. Appointment with Nutritionist 12/05/13.

## 2013-11-22 NOTE — Telephone Encounter (Signed)
Pt requests pain med for leg pain. She states she mentioned pain at today's OV but was never told whether or not Dr. Laural Paul would prescribe one.

## 2013-11-23 NOTE — Telephone Encounter (Signed)
Pt calling back on telephone enc from 11/22/13. She states she does not have to have Percocet pt would like any pain med due to her Arthritis. Pt states she does not have the money for another OV after just seeing [facility] yesterday. Please advise.

## 2013-11-23 NOTE — Telephone Encounter (Signed)
Called patient to discuss her request.  No answer, generic VM left asking patient call my office so I could speak with her.

## 2013-11-24 MED ORDER — TRAMADOL 50 MG TAB
50 mg | ORAL_TABLET | Freq: Four times a day (QID) | ORAL | Status: DC | PRN
Start: 2013-11-24 — End: 2014-07-19

## 2013-11-24 NOTE — Telephone Encounter (Signed)
Called patient , advised of the protocol that I follow for narcotic prescribing.  I advised that since I have reviewed her chart and see that she has sever c spine degenerative changes as assessed by her previous neurosurgeon I will go ahead and give her 1 month of tramadol (patient had initially requested percocet) until she is able to come in for a visit specifically for her chronic pain and until I am able to make my own independent assessment and recommendations.  Patient agrees with plan.      Please advise patient that medication is ready for pick up but she would need to be seen with in the mont prior to any refills being issued.

## 2013-11-25 NOTE — Progress Notes (Signed)
HISTORY OF PRESENT ILLNESS  Cheryl Paul is a 63 y.o. female.  HPI Comments: 63 year old established female in today to follow up on abnormal lab work.    She reports that since her last visit she has had no side effects or complications.  She has been taking her medication with no difficulty or side effects.  She denies polydipsia, polyuria or polyphagia.      Allergies   Allergen Reactions   ??? Iodine Rash   ??? Lactose Diarrhea     Lactose intolerance       Past Medical History   Diagnosis Date   ??? Asthma    ??? Diabetes (HCC)    ??? Hypercholesterolemia    ??? Hypertension    ??? H/O sinusitis    ??? Arthritis      knee, arms,        Family History   Problem Relation Age of Onset   ??? Hypertension Mother    ??? Stroke Mother    ??? Diabetes Mother    ??? Thyroid Disease Mother    ??? Arthritis-rheumatoid Mother    ??? Cancer Father      colon polpe       History   Substance Use Topics   ??? Smoking status: Never Smoker    ??? Smokeless tobacco: Never Used   ??? Alcohol Use: No        Current Outpatient Prescriptions   Medication Sig   ??? metFORMIN (GLUCOPHAGE) 1,000 mg tablet Take 1 Tab by mouth two (2) times daily (with meals).   ??? atorvastatin (LIPITOR) 20 mg tablet Take 1 Tab by mouth daily. Indications: HYPERCHOLESTEROLEMIA   ??? canagliflozin (INVOKANA) 100 mg tablet Take 1 Tab by mouth Daily (before breakfast).   ??? glipiZIDE (GLUCOTROL) 5 mg tablet Take  by mouth two (2) times a day.   ??? clotrimazole-betamethasone (LOTRISONE) 1-0.05 % lotion Apply  to affected area two (2) times a day.   ??? albuterol (VENTOLIN HFA) 90 mcg/actuation inhaler Take  by inhalation.   ??? lisinopril (PRINIVIL, ZESTRIL) 10 mg tablet Take  by mouth daily.   ??? traMADol (ULTRAM) 50 mg tablet Take 1 Tab by mouth every six (6) hours as needed for Pain. Max Daily Amount: 200 mg. Indications: NEUROPATHIC PAIN, PAIN     No current facility-administered medications for this visit.        Past Surgical History   Procedure Laterality Date    ??? Pr abdomen surgery proc unlisted       4 surguries for blockages   ??? Hx hernia repair       abdomen   ??? Hx cholecystectomy     ??? Hx gyn     ??? Hx hysterectomy       tubes tied   ??? Hx carpal tunnel release Bilateral        ROS  Constitutional: Negative for fever and chills.   Respiratory: Negative for cough and shortness of breath.    Cardiovascular: Negative for chest pain and leg swelling.   Gastrointestinal: Negative for nausea, vomiting and diarrhea.   Genitourinary: Negative for dysuria and flank pain.   Musculoskeletal: Negative for myalgias and joint pain.   Neurological: Negative for dizziness, tremors, sensory change, speech change, focal weakness and headaches.   Psychiatric/Behavioral: Negative for depression and suicidal ideas. The patient is not nervous/anxious.      BP 138/82 mmHg   Pulse 75   Temp(Src) 98.2 ??F (36.8 ??C) (Oral)  Resp 18   Ht 5\' 3"  (1.6 m)   Wt 291 lb (131.997 kg)   BMI 51.56 kg/m2   SpO2 98%  Physical Exam   Constitutional: She is oriented to person, place, and time and well-developed, well-nourished, and in no distress.   Cardiovascular: Normal rate, regular rhythm, normal heart sounds and intact distal pulses.    No murmur heard.  Pulmonary/Chest: Effort normal and breath sounds normal. No respiratory distress.   Musculoskeletal: Normal range of motion. She exhibits no edema.   Neurological: She is alert and oriented to person, place, and time. Gait normal.   Skin: Skin is warm and dry.   Psychiatric: Mood, memory, affect and judgment normal.       ASSESSMENT and PLAN    ICD-9-CM    1. Type 2 diabetes mellitus without complication (HCC) 250.00 metFORMIN (GLUCOPHAGE) 1,000 mg tablet     REFERRAL TO NUTRITION     canagliflozin (INVOKANA) 100 mg tablet   2. Hyperlipidemia 272.4 atorvastatin (LIPITOR) 20 mg tablet   3. Screening for colon cancer V76.51 REFERRAL FOR COLONOSCOPY   4. Screening for breast cancer V76.10 MAM MAMMO BI SCREENING DIGTL        -Type on DM with significantly elevated A1C of 10.7  -HLD uncontrolled with TG 265, total chol 225, HDL 47 and LDL 125.    -RTC 2 weeks    Follow-up Disposition:   Return if symptoms worsen or fail to improve.   Risk and benefits of new medication discussed in detail, patient was given the opportunity to ask questions  AVS provided  reviewed diet, exercise and weight control   Alarm signals discussed. ER precautions   Plan of care reviewed with patient. Understanding verbalized and they are in agreement with plan of care.     Baruch Goldmann, DO

## 2013-11-27 NOTE — Telephone Encounter (Signed)
LM for Pt to call:  Has Pt had previous colonscopy and if not has she seen a GI doctor and what is his/her name.

## 2013-11-29 NOTE — Telephone Encounter (Signed)
Pt aware referral form faxed to Dr Candie ChromanLawson's group & is fine with this.

## 2013-11-29 NOTE — Telephone Encounter (Signed)
Pt went to Dr. Hart [place] on [address] in 2003.

## 2013-12-04 NOTE — Telephone Encounter (Signed)
Pt called to advised she had to stop canagliflozin (INVOKANA) 100 mg tablet .Cheryl Paul. Pt said she was having terrible side effects. Please give update on what other medications that could be prescribe. Thanks

## 2013-12-04 NOTE — Telephone Encounter (Signed)
Please triage patient and find out exactly what side effects where as they could be an allergy vs intolerance.  It will help me determine what other medications can be beneficial to her.    Best,  TRJ

## 2013-12-04 NOTE — Telephone Encounter (Signed)
LM w/request for Pt to return call to discuss side effects she is having.

## 2013-12-05 NOTE — ED Provider Notes (Signed)
HPI Comments: 9:14 AM. 12/05/2013    Cheryl Paul is a 63 y.o. female who presents to the ED with complaints of L 4th toe pain since Thursday night(5 days ago) after hitting it on her large wooden bedpost. Pt states that she has been taking left over Tramadol for pain. The pain went away after 2 days and now the toe just feels sore.  The toe began to bruise and there was some bruising on the outside of her left foot so she got concerned.  She went to [facility] ED last night, but there was a very long wait so she went home.  She denies other injuries. She denies nausea, SOB, dizziness. She has no other complaints at this time.           The history is provided by the patient.        Past Medical History   Diagnosis Date   ??? Asthma    ??? Diabetes (HCC)    ??? Hypercholesterolemia    ??? Hypertension    ??? H/O sinusitis    ??? Arthritis      knee, arms,         Past Surgical History   Procedure Laterality Date   ??? Pr abdomen surgery proc unlisted       4 surguries for blockages   ??? Hx hernia repair       abdomen   ??? Hx cholecystectomy     ??? Hx gyn     ??? Hx hysterectomy       tubes tied   ??? Hx carpal tunnel release Bilateral          Family History   Problem Relation Age of Onset   ??? Hypertension Mother    ??? Stroke Mother    ??? Diabetes Mother    ??? Thyroid Disease Mother    ??? Arthritis-rheumatoid Mother    ??? Cancer Father      colon polpe        History     Social History   ??? Marital Status: SINGLE     Spouse Name: N/A     Number of Children: N/A   ??? Years of Education: N/A     Occupational History   ??? Not on file.     Social History Main Topics   ??? Smoking status: Never Smoker    ??? Smokeless tobacco: Never Used   ??? Alcohol Use: No   ??? Drug Use: No   ??? Sexual Activity: Not on file     Other Topics Concern   ??? Not on file     Social History Narrative                  ALLERGIES: Iodine and Lactose      Review of Systems   Constitutional: Negative.    HENT: Negative.    Eyes: Negative.    Respiratory: Negative.     Cardiovascular: Negative.    Gastrointestinal: Negative.    Endocrine: Negative.    Genitourinary: Negative.    Musculoskeletal: Positive for myalgias and arthralgias.   Skin: Negative.    Allergic/Immunologic: Negative.    Neurological: Negative.    Hematological: Negative.    Psychiatric/Behavioral: Negative.    All other systems reviewed and are negative.      Filed Vitals:    12/05/13 0756   BP: 141/82   Pulse: 88   Temp: 98.5 ??F (36.9 ??C)   Resp: 20  Height: 5\' 3"  (1.6 m)   Weight: 135.172 kg (298 lb)   SpO2: 99%            Physical Exam   Constitutional: She is oriented to person, place, and time. She appears well-developed and well-nourished. No distress.   HENT:   Head: Normocephalic and atraumatic.   Eyes: Conjunctivae and EOM are normal. Pupils are equal, round, and reactive to light.   Cardiovascular: Normal rate, regular rhythm and normal heart sounds.    Pulmonary/Chest: Effort normal and breath sounds normal.   Musculoskeletal:        Left foot: There is tenderness (at base of 4th toe over proximal phalanx) and swelling (slight at the 4th toe). There is normal range of motion (ROM of 4th toe increases pain), normal capillary refill, no crepitus, no deformity and no laceration.   L foot:    Neurological: She is alert and oriented to person, place, and time.   Skin: Skin is warm and dry. She is not diaphoretic.   Nursing note and vitals reviewed.       MDM  Number of Diagnoses or Management Options  Fracture of fourth toe, left, closed, initial encounter:   Pain of toe of left foot:   Diagnosis management comments: Suspect a toe fracture.  Will perform x-ray to confirm.         Amount and/or Complexity of Data Reviewed  Tests in the radiology section of CPT??: ordered and reviewed    Patient Progress  Patient progress: improved      Procedures   Left 4th toe buddy taped to the 3rd toe with cotton between the toes.  Pt tolerated the procedure well.  Distal NV supply intact post-taping.        X-Ray, CT or other radiology findings or impressions:  XR TOES LT 2 OR MORE   Preliminary Result      LT toe XR: non-displaced spiral fracture proximal phalanx 4th toe extending to distal articular surface, no dislocation.             Scribe Attestation:   December 05, 2013 at 9:13 AM Oswaldo ConroyErin E. Winebarger scribing for and in the presence of Dr.Vada Swift Carmela Hurt Kieth Hartis, MD     Oswaldo ConroyErin E. Winebarger, Water engineercribe      Provider Attestation:   I personally performed the services described in the documentation, reviewed the documentation, as recorded by the scribe in my presence, and it accurately and completely records my words and actions. December 05, 2013 at 9:43 AM - Norval MortonGAIL D Annahi Short, MD

## 2013-12-05 NOTE — ED Notes (Signed)
Pt. States she hit her 4th left toe on a bed post on Thursday,c/o pain.

## 2013-12-05 NOTE — Progress Notes (Signed)
RD Nutrition Assessment      Anthropometrics  Ht Readings from Last 1 Encounters:   12/05/13 5\' 3"  (1.6 m)     Wt Readings from Last 2 Encounters:   12/05/13 298 lb (135.172 kg)   11/22/13 291 lb (131.997 kg)        Estimated body mass index is 51.56 kg/(m^2) as calculated from the following:    Height as of 11/22/13: 5\' 3"  (1.6 m).    Weight as of 11/22/13: 291 lb (131.997 kg).      IBW: 115#    ABW: 173#         RMR (AF:  1.2    )= 2595kcals/day  Estimated Needs for Weight Loss: 1600 kcals/day    Subjective/Objective    Labs:  Lab Results   Component Value Date/Time    HEMOGLOBIN A1C 10.7 11/14/2013 10:59 AM       Lab Results   Component Value Date/Time    CHOLESTEROL, TOTAL 225 11/14/2013 10:59 AM    HDL CHOLESTEROL 47 11/14/2013 10:59 AM    LDL, CALCULATED 125 11/14/2013 10:59 AM    TRIGLYCERIDE 265 11/14/2013 10:59 AM       Medications: Metformin and glipizide.    Weight & Diet Hx: Patient presents for nutrition counseling for diabetes management. Patient has had diabetes for a while now and attending classes at [facility] about a year ago. Currently checking her blood sugar every once in a while (the other day was 144 after breakfast). Interested in trying a smoothie diet. Currently skipping meals often and consuming a lot of sweet tea.    Food Record Results:   Breakfast- oatmeal (1 cup) or scrambled eggs with boiled sausage.  Lunch- Skips  Dinner- baked meat with vegetable and starch.    Beverages- sweet tea and water.      Exercise: None    Social Hx: NA    ________________________________________________________________________    Diagnosis:  1. Nutrition related knowledge deficit r/t DM as evidenced by patients report of never learning carb counting before and A1c=10.7.        Intervention:  Improve nutrition and diabetes related knowledge base.  Educated on basics of diabetes pathophysiology. Including signs/symptoms of hypo/hyperglycemia + treatment, target blood glucose levels and  importance of regular monitoring.  Discussed meal planning for glycemic control including the plate method, carbohydrate counting (recommend   3    carbs/meal), demonstrated reading food labels & discussed portion control. Encouraged paired glucose monitoring with meals to identify appropriate meals for optimal blood glucose control. Reviewed smoothie diet and discussed fruit portions (cut fruit portions in half from recipe). Reviewed carb amount in sweet tea (at least 60 grams from [employer]). Patient agreed she needs to cut down on this. Provided healthier alternatives to frequently consumed foods and encouraged patient to stop skipping lunch meal.  Provided take home handouts for materials discussed above.            Monitoring/Evaluation  Follow up appointment scheduled per patient. Reassess needs based on weigh-in and progress with nutrition recommendations.    Pt provided with RD contact information for questions/concerns between appointments.     Sula Fetterly A POPE, RD

## 2013-12-07 NOTE — Telephone Encounter (Signed)
LM for Pt to return call concerning side effects to medication.

## 2013-12-07 NOTE — Telephone Encounter (Signed)
Please attempt to call patient again.

## 2013-12-07 NOTE — Telephone Encounter (Signed)
Pt states she was nauseated, fatique,  had unsteady gait & feeling like she had heartburn. She stopped the Invokanna on 11/30/13 and symptoms are gone. Will call with new RX

## 2013-12-08 NOTE — Telephone Encounter (Signed)
Called patient, VM picked up.  Left VM asking patient to call my office on Monday to set up visit with nurse navigator to talk about options to lower A1C.

## 2013-12-12 NOTE — Telephone Encounter (Signed)
Left detail message on patient's personal voicemail that her mammogram was negative. If she has any questions she can give us a call.

## 2013-12-12 NOTE — Telephone Encounter (Signed)
-----   Message from Baruch Goldmann, DO sent at 12/12/2013  2:18 PM EDT -----  Please advise patient of negative mammogram.  ----- Message -----     From: Rad Results In Edi     Sent: 12/12/2013   8:18 AM       To: Baruch Goldmann, DO

## 2014-01-01 ENCOUNTER — Encounter

## 2014-01-01 MED ORDER — BUDESONIDE-FORMOTEROL HFA 80 MCG-4.5 MCG/ACTUATION AEROSOL INHALER
Freq: Two times a day (BID) | RESPIRATORY_TRACT | Status: DC
Start: 2014-01-01 — End: 2015-07-16

## 2014-01-01 NOTE — Telephone Encounter (Signed)
Pt states she can't afford $60 for Symbicort.  Is there something else she can take  or a coupon or something?

## 2014-01-01 NOTE — Patient Instructions (Signed)
Start Symbicort 80/4.5 two inhalation twice a day  Use Albuterol inhaler, two puffs every six hours as needed   IGE  BNP  Follow up in two to three months time

## 2014-01-01 NOTE — Progress Notes (Signed)
Problem list:  History of reactive airway disease   Cough and mild wheezing in extreme weather     HPI: Cheryl Paul is a 63 y/o female who carries known history of reactive airway disease, DM, and HTN is evaluated for interval increase in dry cough and wheezing for past  Five years. Patient carries diagnosis of mild reactive airway disease for past many many years. Her symptoms stayed well control without active treatment. For past five years patient has noticed dry coughing spells especially when she gets expose to dry and or hot humid air. Each coughing spell last for five to 10 seconds. She denies any productive phlegm. She also reports occasional wheezing with such exposure  And or over exertional activity. She denies any exercise limitation. Whatever limitation she has experienced in past is due to knee arthritis. Patient denies any history of Allergic rhinitis and or GERD.    Discussed the patient's above normal BMI with her.  I have recommended the following interventions: dietary management education, guidance, and counseling .  The BMI follow up plan is as follows: BMI is out of normal parameters and plan is as follows: I have counseled this patient on diet and exercise regimens      Allergies   Allergen Reactions   ??? Iodine Rash   ??? Lactose Diarrhea     Lactose intolerance     Current Outpatient Prescriptions   Medication Sig Dispense Refill   ??? ADVOCATE REDI-CODE+ strip      ??? ADVOCATE REDI-CODE+ CTRL LOW soln      ??? ULTRA THIN LANCETS 31 gauge misc      ??? traMADol (ULTRAM) 50 mg tablet Take 1 Tab by mouth every six (6) hours as needed for Pain. Max Daily Amount: 200 mg. Indications: NEUROPATHIC PAIN, PAIN 120 Tab 0   ??? metFORMIN (GLUCOPHAGE) 1,000 mg tablet Take 1 Tab by mouth two (2) times daily (with meals). 60 Tab 3   ??? atorvastatin (LIPITOR) 20 mg tablet Take 1 Tab by mouth daily. Indications: HYPERCHOLESTEROLEMIA 30 Tab 3   ??? glipiZIDE (GLUCOTROL) 5 mg tablet Take  by mouth two (2) times a day.      ??? clotrimazole-betamethasone (LOTRISONE) 1-0.05 % lotion Apply  to affected area two (2) times a day.     ??? lisinopril (PRINIVIL, ZESTRIL) 10 mg tablet Take  by mouth daily.     ??? INVOKANA 100 mg tablet      ??? albuterol (VENTOLIN HFA) 90 mcg/actuation inhaler Take  by inhalation.       Past Medical History   Diagnosis Date   ??? Asthma    ??? Diabetes (HCC)    ??? Hypercholesterolemia    ??? Hypertension    ??? H/O sinusitis    ??? Arthritis      knee, arms,      Past Surgical History   Procedure Laterality Date   ??? Pr abdomen surgery proc unlisted       4 surguries for blockages   ??? Hx hernia repair       abdomen   ??? Hx cholecystectomy     ??? Hx gyn     ??? Hx hysterectomy       tubes tied   ??? Hx carpal tunnel release Bilateral      Family History   Problem Relation Age of Onset   ??? Hypertension Mother    ??? Stroke Mother    ??? Diabetes Mother    ??? Thyroid Disease Mother    ???  Arthritis-rheumatoid Mother    ??? Cancer Father      colon polpe     History     Social History   ??? Marital Status: SINGLE     Spouse Name: N/A     Number of Children: N/A   ??? Years of Education: N/A     Social History Main Topics   ??? Smoking status: Never Smoker    ??? Smokeless tobacco: Never Used   ??? Alcohol Use: No   ??? Drug Use: No   ??? Sexual Activity: Not on file     Other Topics Concern   ??? 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 Denies history of smoking      Review of Systems   Constitutional: Negative.    HENT: Negative.    Eyes: Negative.    Respiratory: Negative.    Cardiovascular: Negative.    Gastrointestinal: Negative.    Genitourinary: Negative.    Musculoskeletal: Negative.    Skin: Negative.    Neurological: Negative.    Endo/Heme/Allergies: Negative.    Psychiatric/Behavioral: Negative.      Blood pressure 160/103, pulse 76, temperature 98.1 ??F (36.7 ??C), temperature source Oral, resp. rate 16, height 5\' 3"  (1.6 m), weight 131.543 kg (290 lb), SpO2 96 %.    Physical Exam    Constitutional: She is oriented to person, place, and time.   Obesity    HENT:   Head: Normocephalic and atraumatic.   Eyes: EOM are normal. Pupils are equal, round, and reactive to light.   Neck: Normal range of motion. Neck supple.   Cardiovascular: Normal rate and regular rhythm.    Pulmonary/Chest: Effort normal.   Abdominal: Soft. Bowel sounds are normal. She exhibits no distension.   Musculoskeletal: Normal range of motion. She exhibits edema. She exhibits no tenderness.   Trace    Neurological: She is alert and oriented to person, place, and time.   Skin: Skin is warm and dry.   Psychiatric: She has a normal mood and affect. Her behavior is normal.     Investigation   PFT (12/27/13): Mixed respiratory defect   DATE Pre bronchodilator  Post  Bronchodilator    FVC 1.99(80) 2.09(84)(5)   FEV1 1.47(75) 1.53(5)   FEV1/FVC 74(79) 73   TLC 3.38(79)    RV 1.47(84)    RV/TLC 44(41)    DLCO 17.09(86)        Results for Cheryl Paul, Cheryl Paul (MRN [id number]) as of 01/01/2014 10:41   Ref. Range 05/23/2008 15:05 11/14/2013 10:59   WBC Latest Range: 3.4-10.8 x10E3/uL 8.4 7.6   RBC Latest Range: 3.77-5.28 x10E6/uL 5.16 (H) 5.01   HGB Latest Range: 11.1-15.9 g/dL 04.513.9 40.913.6   HCT Latest Range: 34.0-46.6 % 43.2 42.3   MCV Latest Range: 79-97 fL 83.7 84   MCH Latest Range: 26.6-33.0 pg 26.9 27.1   MCHC Latest Range: 31.5-35.7 g/dL 81.132.1 91.432.2   RDW Latest Range: 12.3-15.4 % 14.9 (H) 13.8   PLATELET Latest Range: 150-379 x10E3/uL 180 189   MPV Latest Range: 7.4-10.4 FL 10.9 (H)    NEUTROPHILS Latest Range: 42-75 % 64    LYMPHOCYTES Latest Range: 20-51 % 27    MONOCYTES Latest Range: 2-9 % 5    EOSINOPHILS Latest Range: 0-5 % 4    BASOPHILS Latest Range: 0-3 % 0    DF No range found AUTOMATED    ABS. NEUTROPHILS Latest Range:  1.8-8.0 K/UL 5.4    ABS. LYMPHOCYTES Latest Range: 0.8-3.5 K/UL 2.3    ABS. MONOCYTES Latest Range: 0-1.0 K/UL 0.4    ABS. EOSINOPHILS Latest Range: 0.0-0.4 K/UL 0.3      Assessment    Mild persistent reactive airway disease.  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 Allena Katz, MD, MBA   Pulmonary and Critical Care Medicine         .

## 2014-01-01 NOTE — Addendum Note (Signed)
Addended by: Dyanne CarrelUKE, JUSTINA M on: 01/01/2014 11:52 AM      Modules accepted: Level of Service

## 2014-01-01 NOTE — Telephone Encounter (Signed)
The pt. Is noted to have [employer] FEP retired Investment banker, corporate.  She is told $60 is very cheap for an inhaler.  Due to the [facility] FEP there are not many coupons available for use but, she could look on line for coupons and Patient Assistance programs.

## 2014-01-04 LAB — IMMUNOGLOBULIN E, QT: Immunoglobulin E: 158 IU/mL — ABNORMAL HIGH (ref 0–100)

## 2014-01-04 LAB — BNP: B-type Natriuretic Peptide: 26.9 pg/mL (ref 0.0–100.0)

## 2014-02-05 NOTE — Progress Notes (Signed)
HISTORY OF PRESENT ILLNESS  Cheryl Paul is a 63 y.o. female.  HPI Comments: 63 year old established female in today for 3 mos follow up on HTN, COPD, DM, arthritis and HLD.      Patient is here today for her follow up, she has concerns about her blood sugars.  She was on a third agent invokana but had side effects and D/C'ed it.  She reports that BG at home average 140 and she has one reading in the 200's.  She doe not have her log today.  She reports that she is doing well.  She is taking her medications with no adverse side effects, she is requesting refills today.  She denies CP, SOB, dyspnea, edema, N/T or myalgias.    She reports a sedentary lifestyle.    She has seen the nutritionist but has not started a diet plan as yet, she has gained 3 lbs.  She is fasting today and has not taken her medication    She has sleep apnea but needs a new CPAP    She reports chronic pain related to what sounds like a pinched nerve that radiates down her right arm    Allergies   Allergen Reactions   ??? Iodine Rash   ??? Lactose Diarrhea     Lactose intolerance       Past Medical History   Diagnosis Date   ??? Asthma    ??? Diabetes (HCC)    ??? Hypercholesterolemia    ??? Hypertension    ??? H/O sinusitis    ??? Arthritis      knee, arms,    ??? Toe fracture, left August 2015     4th toe       Family History   Problem Relation Age of Onset   ??? Hypertension Mother    ??? Stroke Mother    ??? Diabetes Mother    ??? Thyroid Disease Mother    ??? Arthritis-rheumatoid Mother    ??? Cancer Father      colon polpe       History   Substance Use Topics   ??? Smoking status: Never Smoker    ??? Smokeless tobacco: Never Used   ??? Alcohol Use: No        Current Outpatient Prescriptions   Medication Sig   ??? ADVOCATE REDI-CODE+ strip    ??? ADVOCATE REDI-CODE+ CTRL LOW soln    ??? ULTRA THIN LANCETS 31 gauge misc    ??? budesonide-formoterol (SYMBICORT) 80-4.5 mcg/actuation HFAA inhaler Take 2 Puffs by inhalation two (2) times a day.    ??? traMADol (ULTRAM) 50 mg tablet Take 1 Tab by mouth every six (6) hours as needed for Pain. Max Daily Amount: 200 mg. Indications: NEUROPATHIC PAIN, PAIN   ??? metFORMIN (GLUCOPHAGE) 1,000 mg tablet Take 1 Tab by mouth two (2) times daily (with meals).   ??? atorvastatin (LIPITOR) 20 mg tablet Take 1 Tab by mouth daily. Indications: HYPERCHOLESTEROLEMIA   ??? glipiZIDE (GLUCOTROL) 5 mg tablet Take  by mouth two (2) times a day.   ??? clotrimazole-betamethasone (LOTRISONE) 1-0.05 % lotion Apply  to affected area two (2) times a day.   ??? albuterol (VENTOLIN HFA) 90 mcg/actuation inhaler Take  by inhalation.   ??? lisinopril (PRINIVIL, ZESTRIL) 10 mg tablet Take  by mouth daily.     No current facility-administered medications for this visit.        Past Surgical History   Procedure Laterality Date   ???  Pr abdomen surgery proc unlisted       4 surguries for blockages   ??? Hx hernia repair       abdomen   ??? Hx cholecystectomy     ??? Hx gyn     ??? Hx hysterectomy       tubes tied   ??? Hx carpal tunnel release Bilateral        ROS   Constitutional: See HPI.  Negative for fever and chills.   HENT: Negative for tinnitus.    Eyes: Negative for blurred vision and double vision.   Respiratory: Negative for cough and shortness of breath.    Cardiovascular: Negative for chest pain and leg swelling.   Gastrointestinal: Negative for nausea, vomiting and diarrhea.   Genitourinary: Negative for dysuria and flank pain.   Musculoskeletal: See HPI.  Negative for myalgias and joint pain.   Neurological: See HPI.  Negative for dizziness, tremors, sensory change, speech change, focal weakness and headaches.   Psychiatric/Behavioral: Negative for depression and suicidal ideas. The patient is not nervous/anxious.    BP 146/81 mmHg   Pulse 76   Temp(Src) 97.3 ??F (36.3 ??C) (Oral)   Resp 16   Ht  (1.6 m)   Wt 293 lb (132.904 kg)   BMI 51.92 kg/m2   SpO2 99%  Physical Exam  Constitutional: Obese habitus.  She is oriented to person, place, and time  and in no distress.   HENT:   Head: Normocephalic and atraumatic.   Right Ear: External ear normal.   Left Ear: External ear normal.   Eyes: Pupils are equal, round, and reactive to light.   Neck: Normal range of motion.   Cardiovascular: Normal rate, regular rhythm, normal heart sounds and intact distal pulses.    No murmur heard.  Pulmonary/Chest: Effort normal and breath sounds normal. No respiratory distress.   Musculoskeletal: +  Trace edema.  Normal range of motion.   Neurological: She is alert and oriented to person, place, and time. Gait normal.   Skin: Skin is warm and dry.   Psychiatric: Mood, memory, affect and judgment normal.     ASSESSMENT and PLAN    ICD-9-CM ICD-10-CM    1. Type 2 diabetes mellitus without complication (HCC) 250.00 E11.9 HEMOGLOBIN A1C      METABOLIC PANEL, COMPREHENSIVE      TSH, 3RD GENERATION      CBC W/O DIFF      [id number] [facility] DRAW FEE      [id number] - [facility] COLLECTION   2. COPD (chronic obstructive pulmonary disease) (HCC) 496 J44.9 METABOLIC PANEL, COMPREHENSIVE      CBC W/O DIFF   3. Incontinence in female 625.6 N39.3    4. Arthritis, multiple joint involvement 716.99 M12.9    5. Essential hypertension 401.9 I10 METABOLIC PANEL, COMPREHENSIVE      CBC W/O DIFF   6. Hyperlipidemia 272.4 E78.5 LIPID PANEL      METABOLIC PANEL, COMPREHENSIVE      CBC W/O DIFF   7. Sleep apnea, obstructive 327.23 G47.33 REFERRAL TO NEUROLOGY      METABOLIC PANEL, COMPREHENSIVE      CBC W/O DIFF   8. Need for hepatitis C screening test V73.89 Z11.59 HEPATITIS C AB   9. Diabetic eye exam (HCC) V72.0 E11.9 REFERRAL TO OPHTHALMOLOGY    250.00 Z01.00    10. Morbid obesity (HCC) 278.01 E66.01 HEMOGLOBIN A1C      METABOLIC PANEL, COMPREHENSIVE      TSH, 3RD  GENERATION      CBC W/O DIFF   11. Encounter for vitamin deficiency screening V77.99 Z13.21 VITAMIN D, 25 HYDROXY      VITAMIN D, 25 HYDROXY     -Advised continued compliance with medication regimen and therapeutic treatment plan.   -labs ordered today  -Patient will likely need the addition of a third agent for better DM control.  Awaiting A1C for further assessment.  -RTC 2 weeks to follow up on labs    Follow-up Disposition:   Return if symptoms worsen or fail to improve.   Risk and benefits of new medication discussed in detail, patient was given the opportunity to ask questions  AVS provided  reviewed diet, exercise and weight control   Alarm signals discussed. ER precautions   Plan of care reviewed with patient. Understanding verbalized and they are in agreement with plan of care.     Baruch Goldmann, DO    -

## 2014-02-05 NOTE — Patient Instructions (Addendum)
Learning About Diabetes Food Guidelines  Your Care Instructions  Meal planning is important to manage diabetes. It helps keep your blood sugar at a target level (which you set with your doctor). You don't have to eat special foods. You can eat what your family eats, including sweets once in a while. But you do have to pay attention to how often you eat and how much you eat of certain foods.  You may want to work with a dietitian or a certified diabetes educator (CDE) to help you plan meals and snacks. A dietitian or CDE can also help you lose weight if that is one of your goals.  What should you know about eating carbs?  Managing the amount of carbohydrate (carbs) you eat is an important part of healthy meals when you have diabetes. Carbohydrate is found in many foods.  ?? Learn which foods have carbs. And learn the amounts of carbs in different foods.  ?? Bread, cereal, pasta, and rice have about 15 grams of carbs in a serving. A serving is 1 slice of bread (1 ounce), ?? cup of cooked cereal, or 1/3 cup of cooked pasta or rice.  ?? Fruits have 15 grams of carbs in a serving. A serving is 1 small fresh fruit, such as an apple or orange; ?? of a banana; ?? cup of cooked or canned fruit; ?? cup of fruit juice; 1 cup of melon or raspberries; or 2 tablespoons of dried fruit.  ?? Milk and no-sugar-added yogurt have 15 grams of carbs in a serving. A serving is 1 cup of milk or 2/3 cup of no-sugar-added yogurt.  ?? Starchy vegetables have 15 grams of carbs in a serving. A serving is ?? cup of mashed potatoes or sweet potato; 1 cup winter squash; ?? of a small baked potato; ?? cup of cooked beans; or ?? cup cooked corn or green peas.  ?? Learn how much carbs to eat each day and at each meal. A dietitian or CDE can teach you how to keep track of the amount of carbs you eat. This is called carbohydrate counting.  ?? If you are not sure how to count carbohydrate grams, use the Plate  Method to plan meals. It is a good, quick way to make sure that you have a balanced meal. It also helps you spread carbs throughout the day.  ?? Divide your plate by types of foods. Put non-starchy vegetables on half the plate, meat or other protein food on one-quarter of the plate, and a grain or starchy vegetable in the final quarter of the plate. To this you can add a small piece of fruit and 1 cup of milk or yogurt, depending on how many carbs you are supposed to eat at a meal.  ?? Try to eat about the same amount of carbs at each meal. Do not "save up" your daily allowance of carbs to eat at one meal.  ?? Proteins have very little or no carbs per serving. Examples of proteins are beef, chicken, turkey, fish, eggs, tofu, cheese, cottage cheese, and peanut butter. A serving size of meat is 3 ounces, which is about the size of a deck of cards. Examples of meat substitute serving sizes (equal to 1 ounce of meat) are 1/4 cup of cottage cheese, 1 egg, 1 tablespoon of peanut butter, and ?? cup of tofu.  How can you eat out and still eat healthy?  ?? Learn to estimate the serving sizes of foods that have   carbohydrate. If you measure food at home, it will be easier to estimate the amount in a serving of restaurant food.  ?? If the meal you order has too much carbohydrate (such as potatoes, corn, or baked beans), ask to have a low-carbohydrate food instead. Ask for a salad or green vegetables.  ?? If you use insulin, check your blood sugar before and after eating out to help you plan how much to eat in the future.  ?? If you eat more carbohydrate at a meal than you had planned, take a walk or do other exercise. This will help lower your blood sugar.  What else should you know?  ?? Limit saturated fat, such as the fat from meat and dairy products. This is a healthy choice because people who have diabetes are at higher risk of heart disease. So choose lean cuts of meat and nonfat or low-fat dairy  products. Use olive or canola oil instead of butter or shortening when cooking.  ?? Don't skip meals. Your blood sugar may drop too low if you skip meals and take insulin or certain medicines for diabetes.  ?? Check with your doctor before you drink alcohol. Alcohol can cause your blood sugar to drop too low. Alcohol can also cause a bad reaction if you take certain diabetes medicines.  Follow-up care is a key part of your treatment and safety. Be sure to make and go to all appointments, and call your doctor if you are having problems. It's also a good idea to know your test results and keep a list of the medicines you take.   Where can you learn more?   Go to http://www.healthwise.net/[facility]  Enter I147 in the search box to learn more about "Learning About Diabetes Food Guidelines."   ?? 2006-2015 Healthwise, Inc[employer]. Care instructions adapted under license by [facility] (which disclaims liability or warranty for this information). This care instruction is for use with your licensed healthcare professional. If you have questions about a medical condition or this instruction, always ask your healthcare professional. Healthwise, Incorporated disclaims any warranty or liability for your use of this information.  Content Version: 10.5.422740; Current as of: June 29, 2013          Follow-up Disposition:   Return if symptoms worsen or fail to improve.   Risk and benefits of new medication discussed in detail, patient was given the opportunity to ask questions  AVS provided  reviewed diet, exercise and weight control   Alarm signals discussed. ER precautions   Plan of care reviewed with patient. Understanding verbalized and they are in agreement with plan of care.     Teresa R Johnson, DO
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
## 2014-02-06 LAB — HEPATITIS C ANTIBODY: HCV Ab: <0.1 s/co ratio (ref 0.0–0.9)

## 2014-02-06 LAB — CBC W/O DIFF
HCT: 42.3 % (ref 34.0–46.6)
HGB: 13.3 g/dL (ref 11.1–15.9)
MCH: 27.2 pg (ref 26.6–33.0)
MCHC: 31.4 g/dL — ABNORMAL LOW (ref 31.5–35.7)
MCV: 87 fL (ref 79–97)
PLATELET: 187 10*3/uL (ref 150–379)
RBC: 4.89 x10E6/uL (ref 3.77–5.28)
RDW: 13.6 % (ref 12.3–15.4)
WBC: 8.3 10*3/uL (ref 3.4–10.8)

## 2014-02-06 LAB — METABOLIC PANEL, COMPREHENSIVE
A-G Ratio: 2 (ref 1.1–2.5)
ALT (SGPT): 9 IU/L (ref 0–32)
AST (SGOT): 10 IU/L (ref 0–40)
Albumin: 4.3 g/dL (ref 3.6–4.8)
Alk. phosphatase: 60 IU/L (ref 39–117)
BUN/Creatinine ratio: 18 (ref 11–26)
BUN: 12 mg/dL (ref 8–27)
Bilirubin, total: 0.3 mg/dL (ref 0.0–1.2)
CO2: 23 mmol/L (ref 18–29)
Calcium: 9.1 mg/dL (ref 8.7–10.3)
Chloride: 100 mmol/L (ref 97–108)
Creatinine: 0.67 mg/dL (ref 0.57–1.00)
GFR est AA: 109 mL/min/{1.73_m2} (ref >59)
GFR est non-AA: 95 mL/min/{1.73_m2} (ref >59)
GLOBULIN, TOTAL: 2.1 g/dL (ref 1.5–4.5)
Glucose: 219 mg/dL — ABNORMAL HIGH (ref 65–99)
Potassium: 4.5 mmol/L (ref 3.5–5.2)
Protein, total: 6.4 g/dL (ref 6.0–8.5)
Sodium: 140 mmol/L (ref 134–144)

## 2014-02-06 LAB — VITAMIN D, 25 HYDROXY: VITAMIN D, 25-HYDROXY: 13.1 ng/mL — ABNORMAL LOW (ref 30.0–100.0)

## 2014-02-06 LAB — LIPID PANEL
Cholesterol, total: 161 mg/dL (ref 100–199)
HDL Cholesterol: 47 mg/dL (ref >39)
LDL, calculated: 80 mg/dL (ref 0–99)
Triglyceride: 168 mg/dL — ABNORMAL HIGH (ref 0–149)
VLDL, calculated: 34 mg/dL (ref 5–40)

## 2014-02-06 LAB — HEMOGLOBIN A1C WITH EAG: Hemoglobin A1c: 9.2 % — ABNORMAL HIGH (ref 4.8–5.6)

## 2014-02-06 LAB — TSH 3RD GENERATION: TSH: 2.08 u[IU]/mL (ref 0.450–4.500)

## 2014-02-06 LAB — CVD REPORT

## 2014-02-06 LAB — HEPATITIS C AB: HEP C VIRUS AB: <0.1 s/co ratio (ref 0.0–0.9)

## 2014-02-06 NOTE — Telephone Encounter (Signed)
Dr Oletta Lamas  [facility]   Tuesday, Nov. 3, 2015 @ 2:45pm  [address]  [place], [place]    Ph [phone]  Fax [phone]  LM w/appt info on Pt's cell

## 2014-02-06 NOTE — Telephone Encounter (Signed)
[facility]  Dr Cristopher Peru  02/14/14 @ 8:30am  [address]  [place], [place]  [phone]    Pt will need to bring Cpap to appt if she has one.  Appt will last 1 to 1 1/2 hours.

## 2014-02-12 NOTE — Progress Notes (Signed)
Quick Note:        Will discuss with patient on upcoming visit.    ______

## 2014-02-15 ENCOUNTER — Encounter: Primary: Family Medicine

## 2014-02-19 ENCOUNTER — Encounter: Attending: Family Medicine | Primary: Family Medicine

## 2014-02-19 NOTE — Progress Notes (Signed)
Quick Note:        Called and discussed with patient, she will reschedule her appointment.    ______
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
## 2014-03-02 ENCOUNTER — Other Ambulatory Visit: Payer: Self-pay

## 2014-03-06 ENCOUNTER — Encounter: Attending: Pulmonary Disease | Primary: Family Medicine

## 2014-03-09 MED ORDER — LISINOPRIL 10 MG TAB
10 mg | ORAL_TABLET | Freq: Every day | ORAL | Status: DC
Start: 2014-03-09 — End: 2015-01-12

## 2014-03-09 NOTE — Telephone Encounter (Signed)
Requested Prescriptions     Pending Prescriptions Disp Refills   ??? lisinopril (PRINIVIL, ZESTRIL) 10 mg tablet       Sig: Take  by mouth daily.

## 2014-03-26 ENCOUNTER — Encounter

## 2014-04-16 MED ORDER — ATORVASTATIN 20 MG TAB
20 mg | ORAL_TABLET | ORAL | Status: DC
Start: 2014-04-16 — End: 2014-09-13

## 2014-04-18 ENCOUNTER — Inpatient Hospital Stay: Payer: BLUE CROSS/BLUE SHIELD

## 2014-04-18 LAB — GLUCOSE, POC
Glucose (POC): 156 mg/dL — ABNORMAL HIGH (ref 70–110)
Glucose (POC): 132 mg/dL — ABNORMAL HIGH (ref 70–110)

## 2014-04-18 MED ORDER — INSULIN LISPRO 100 UNIT/ML INJECTION
100 unit/mL | Freq: Once | SUBCUTANEOUS | Status: AC
Start: 2014-04-18 — End: 2014-04-18
  Administered 2014-04-18: 14:00:00 via SUBCUTANEOUS

## 2014-04-18 MED ORDER — SODIUM CHLORIDE 0.9 % IJ SYRG
Freq: Three times a day (TID) | INTRAMUSCULAR | Status: DC
Start: 2014-04-18 — End: 2014-04-18

## 2014-04-18 MED ORDER — LACTATED RINGERS IV
INTRAVENOUS | Status: DC
Start: 2014-04-18 — End: 2014-04-18
  Administered 2014-04-18: 15:00:00 via INTRAVENOUS

## 2014-04-18 MED ORDER — PROPOFOL 10 MG/ML IV EMUL
10 mg/mL | INTRAVENOUS | Status: DC | PRN
Start: 2014-04-18 — End: 2014-04-18
  Administered 2014-04-18 (×6): via INTRAVENOUS

## 2014-04-18 MED ORDER — FAMOTIDINE (PF) 20 MG/2 ML IV
20 mg/2 mL | Freq: Once | INTRAVENOUS | Status: AC
Start: 2014-04-18 — End: 2014-04-18
  Administered 2014-04-18: 15:00:00 via INTRAVENOUS

## 2014-04-18 MED ORDER — SODIUM CHLORIDE 0.9 % IJ SYRG
INTRAMUSCULAR | Status: DC | PRN
Start: 2014-04-18 — End: 2014-04-18

## 2014-04-18 MED ORDER — LIDOCAINE (PF) 20 MG/ML (2 %) IJ SOLN
20 mg/mL (2 %) | INTRAMUSCULAR | Status: DC | PRN
Start: 2014-04-18 — End: 2014-04-18
  Administered 2014-04-18: 15:00:00 via INTRAVENOUS

## 2014-04-18 MED FILL — FAMOTIDINE (PF) 20 MG/2 ML IV: 20 mg/2 mL | INTRAVENOUS | Qty: 2

## 2014-04-18 MED FILL — LACTATED RINGERS IV: INTRAVENOUS | Qty: 1000

## 2014-04-18 MED FILL — INSULIN LISPRO 100 UNIT/ML INJECTION: 100 unit/mL | SUBCUTANEOUS | Qty: 1

## 2014-04-18 MED FILL — BD POSIFLUSH NORMAL SALINE 0.9 % INJECTION SYRINGE: INTRAMUSCULAR | Qty: 10

## 2014-04-18 NOTE — Anesthesia Post-Procedure Evaluation (Signed)
Post-Anesthesia Evaluation and Assessment    Patient: Cheryl Paul MRN: [id number]  SSN: [id number]    Date of Birth: 08-May-1951  Age: 63 y.o.  Sex: female       Cardiovascular Function/Vital Signs  Visit Vitals   Item Reading   ??? BP 109/62 mmHg   ??? Pulse 69   ??? Temp 36.7 ??C (98.1 ??F)   ??? Resp 18   ??? Ht 5\' 3"  (1.6 m)   ??? Wt 131.543 kg (290 lb)   ??? BMI 51.38 kg/m2   ??? SpO2 100%       Patient is status post MAC anesthesia for Procedure(s):  COLONOSCOPY with cold forceps polypectomy.    Nausea/Vomiting: None    Postoperative hydration reviewed and adequate.    Pain:  Pain Scale 1: Numeric (0 - 10) (04/18/14 1039)  Pain Intensity 1: 0 (04/18/14 1001)   Managed    Neurological Status:       At baseline    Mental Status and Level of Consciousness: Alert and oriented     Pulmonary Status:   O2 Device: Oxygen mask (04/18/14 1029)   Adequate oxygenation and airway patent    Complications related to anesthesia: None    Post-anesthesia assessment completed. No concerns    Signed By: Guss BundeXIAOPING Darnice Comrie, MD     April 18, 2014

## 2014-04-18 NOTE — Other (Signed)
Patient armband removed and shredded

## 2014-04-18 NOTE — Anesthesia Pre-Procedure Evaluation (Signed)
Anesthetic History   No history of anesthetic complications            Review of Systems / Medical History  Patient summary reviewed    Pulmonary    COPD: moderate    Sleep apnea: CPAP    Asthma : well controlled       Neuro/Psych   Within defined limits           Cardiovascular    Hypertension: well controlled              Exercise tolerance: >4 METS     GI/Hepatic/Renal  Within defined limits              Endo/Other    Diabetes: well controlled, type 2         Other Findings              Physical Exam    Airway  Mallampati: III  TM Distance: 4 - 6 cm  Neck ROM: short neck   Mouth opening: Diminished (comment)     Cardiovascular  Regular rate and rhythm,  S1 and S2 normal,  no murmur, click, rub, or gallop             Dental    Dentition: Poor dentition     Pulmonary  Breath sounds clear to auscultation               Abdominal  GI exam deferred       Other Findings            Anesthetic Plan    ASA: 3  Anesthesia type: MAC            Anesthetic plan and risks discussed with: Patient

## 2014-04-18 NOTE — Procedures (Signed)
[facility]  [address]  [place], [place] [id number]      Brief Procedure Note    [name]  1950/08/03  [id number]    Date of Procedure: 04/18/2014    Preoperative diagnosis: Hx colon polyps    Postoperative diagnosis: colon polyps x 2, internal hemorrhoids; non-bleeding cecal AVM    Procedure: Procedure(s):  COLONOSCOPY with cold forceps polypectomy    Operator:  Dr. Olivia Mackieanielle J Ruble Buttler, MD    Assistant(s): Endoscopy Technician-1: Leane ParaEloisa Sodusta  Endoscopy Technician-2: Rosalita ChessmanLoleta S Bass  Endoscopy RN-1: Leotis Painebra L. Gordy LevanWalton    NWG:[id number]:None    Specimens:   ID Type Source Tests Collected by Time Destination   1 : descending colon polyp Preservative Colon, Descending  Olivia Mackieanielle J Shaden Higley, MD 04/18/2014 1023 Pathology   2 : sigmoid polyp Preservative Sigmoid  Olivia Mackieanielle J Velton Roselle, MD 04/18/2014 1023 Pathology       Findings: See printed and scanned procedure note    Complications: None    Dr. Olivia Mackieanielle J Kissy Cielo, MD  04/18/2014  10:33 AM

## 2014-04-18 NOTE — Other (Signed)
Cheryl MawBrenda T Paul  [birth year]  [id number]    Situation:  Verbal report to  Claris CheErin Lottman RN  Procedure: Procedure(s):  COLONOSCOPY with cold forceps polypectomy    Background:    Preoperative diagnosis: Hx colon polyps  Postoperative diagnosis: colon polyps, internal hemorrhoids    Operator:  Dr. Rubye Oaks. Culbert  Assistant(s): Endoscopy Technician-1: Memory DanceEloisa Sodusta  Endoscopy Technician-2: Rosalita ChessmanLoleta S Bass  Endoscopy RN-1: Leotis Painebra L. Gordy LevanWalton    Specimens:   ID Type Source Tests Collected by Time Destination   1 : descending colon polyp Preservative Colon, Descending  Olivia Mackieanielle J Culbert, MD 04/18/2014 1023 Pathology   2 : sigmoid polyp Preservative Sigmoid  Olivia Mackieanielle J Culbert, MD 04/18/2014 1023 Pathology       Assessment:  Intra-procedure medications       Anesthesia gave intra-procedure sedation and medications, see anesthesia flow sheet    Abdominal assessment: round and soft     Recommendation:  Discharge patient per MD order to home
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
## 2014-04-18 NOTE — H&P (Signed)
 Formatting of this note is different from the original.  Images from the original note were not included.    History and Physical    Patient: Cheryl Paul MRN: [id number]  SSN: [id number]    Date of Birth: Nov 18, 1950  Age: 63 y.o.  Sex: female      Subjective:     Cheryl Paul is a 63 y.o. female who presents for interval colon cancer surveillance. Last colonoscopy in 2003-2004 with removal of 1-2 polyps. Denies any symptoms or concerns.     Past Medical History   Diagnosis Date   ? Asthma    ? Diabetes (HCC)    ? Hypercholesterolemia    ? Hypertension    ? H/O sinusitis    ? Arthritis      knee, arms,    ? Toe fracture, left August 2015     4th toe   ? Chronic obstructive pulmonary disease [facility])      Past Surgical History   Procedure Laterality Date   ? Pr abdomen surgery proc unlisted       4 surguries for blockages   ? Hx hernia repair       abdomen   ? Hx cholecystectomy     ? Hx gyn     ? Hx hysterectomy       tubes tied   ? Hx carpal tunnel release Bilateral      Family History   Problem Relation Age of Onset   ? Hypertension Mother    ? Stroke Mother    ? Diabetes Mother    ? Thyroid Disease Mother    ? Arthritis-rheumatoid Mother    ? Cancer Father      colon polpe     History   Substance Use Topics   ? Smoking status: Never Smoker    ? Smokeless tobacco: Never Used   ? Alcohol Use: No     Prior to Admission medications    Medication Sig Start Date End Date Taking? Authorizing Provider   lisinopril (PRINIVIL, ZESTRIL) 10 mg tablet Take 1 Tab by mouth daily. 03/09/14  Yes Verneita JONELLE Louder, DO   ADVOCATE REDI-CODE+ strip  10/11/13  Yes Historical Provider   ADVOCATE REDI-CODE+ CTRL LOW soln  10/11/13  Yes Historical Provider   ULTRA THIN LANCETS 31 gauge misc  10/11/13  Yes Historical Provider   budesonide-formoterol (SYMBICORT) 80-4.5 mcg/actuation HFAA inhaler Take 2 Puffs by inhalation two (2) times a day. 01/01/14  Yes Laurence Blanch, MD   traMADol (ULTRAM) 50 mg tablet Take 1 Tab by mouth every six (6)  hours as needed for Pain. Max Daily Amount: 200 mg. Indications: NEUROPATHIC PAIN, PAIN 11/24/13  Yes Verneita JONELLE Louder, DO   metFORMIN (GLUCOPHAGE) 1,000 mg tablet Take 1 Tab by mouth two (2) times daily (with meals). 11/22/13  Yes Verneita JONELLE Louder, DO   glipiZIDE (GLUCOTROL) 5 mg tablet Take  by mouth two (2) times a day.   Yes Historical Provider   clotrimazole-betamethasone (LOTRISONE) 1-0.05 % lotion Apply  to affected area two (2) times a day.   Yes Historical Provider   albuterol (VENTOLIN HFA) 90 mcg/actuation inhaler Take  by inhalation.   Yes Historical Provider   atorvastatin (LIPITOR) 20 mg tablet TAKE ONE TABLET BY MOUTH ONE TIME DAILY  04/16/14   Teresa R Johnson, DO       Allergies   Allergen Reactions   ? Iodine Rash   ? Lactose Diarrhea  Lactose intolerance     Review of Systems:  A comprehensive review of systems was negative except for that written in the History of Present Illness.    Objective:     Filed Vitals:    03/30/14 1013   Height: 5' 3 (1.6 m)   Weight: 132.45 kg (292 lb)       Physical Exam:  GENERAL: alert, cooperative, no distress, appears stated age  LUNG: clear to auscultation bilaterally  HEART: regular rate and rhythm, S1, S2 normal, no murmur, click, rub or gallop  ABDOMEN: soft, non-tender. Bowel sounds normal. No masses,  no organomegaly  NEUROLOGIC: alert & oriented x 3    Assessment:     1. Personal history of colon polyps    Plan:     1. Colonoscopy    Signed By: Edsel JINNY Reeds, MD     April 18, 2014        Electronically signed by Reeds Edsel JINNY, MD at 04/18/2014  7:39 AM EST

## 2014-04-18 NOTE — Unmapped External Note (Signed)
 Formatting of this note might be different from the original.  Patient armband removed and shredded    Electronically signed by Glinda Welby CROME, RN at 04/18/2014 12:12 PM EST

## 2014-04-18 NOTE — Procedures (Signed)
 Formatting of this note is different from the original.  Images from the original note were not included.      [facility]  [address]  [place], [place] [address]    Brief Procedure Note    Cheryl Paul  May 15, 1951  [id number]    Date of Procedure: 04/18/2014    Preoperative diagnosis: Hx colon polyps    Postoperative diagnosis: colon polyps x 2, internal hemorrhoids; non-bleeding cecal AVM    Procedure: Procedure(s):  COLONOSCOPY with cold forceps polypectomy    Operator:  Dr. Edsel JINNY Reeds, MD    Assistant(s): Endoscopy Technician-1: Marni Mohr  Endoscopy Technician-2: Benny GORMAN Buddle  Endoscopy RN-1: Adrien CROME. Lebron    ZAO:Wnwz    Specimens:   ID Type Source Tests Collected by Time Destination   1 : descending colon polyp Preservative Colon, Descending  Edsel JINNY Reeds, MD 04/18/2014 1023 Pathology   2 : sigmoid polyp Preservative Sigmoid  Edsel JINNY Reeds, MD 04/18/2014 1023 Pathology     Findings: See printed and scanned procedure note    Complications: None    Dr. Edsel JINNY Reeds, MD  04/18/2014  10:33 AM      Electronically signed by Reeds Edsel JINNY, MD at 04/18/2014 10:34 AM EST

## 2014-04-18 NOTE — Unmapped External Note (Signed)
 Formatting of this note is different from the original.  Cheryl Paul  Feb 15, 1951  [id number]    Situation:  Verbal report to  Rocky Petty RN  Procedure: Procedure(s):  COLONOSCOPY with cold forceps polypectomy    Background:    Preoperative diagnosis: Hx colon polyps  Postoperative diagnosis: colon polyps, internal hemorrhoids    Operator:  Dr. CHARM Reeds  Assistant(s): Endoscopy Technician-1: Marni Sodusta  Endoscopy Technician-2: Benny GORMAN Buddle  Endoscopy RN-1: Adrien CROME. Lebron    Specimens:   ID Type Source Tests Collected by Time Destination   1 : descending colon polyp Preservative Colon, Descending  Edsel JINNY Reeds, MD 04/18/2014 1023 Pathology   2 : sigmoid polyp Preservative Sigmoid  Edsel JINNY Reeds, MD 04/18/2014 1023 Pathology     Assessment:  Intra-procedure medications     Anesthesia gave intra-procedure sedation and medications, see anesthesia flow sheet    Abdominal assessment: round and soft     Recommendation:  Discharge patient per MD order to home  Electronically signed by Lebron Adrien L at 04/18/2014 10:33 AM EST

## 2014-04-18 NOTE — Procedures (Signed)
Procedures by Olivia Mackieulbert, Rossie Bretado J, MD at 04/18/14 1033                Author: Olivia Mackieulbert, Claudis Giovanelli J, MD  Service: Gastroenterology  Author Type: Physician       Filed: 04/18/14 1034  Date of Service: 04/18/14 1033  Status: Signed          Editor: Olivia Mackieulbert, Blandon Offerdahl J, MD (Physician)                                [facility]   [address]   [place], [place] [id number]         Brief Procedure Note      Cheryl Paul   [birth year]   [id number]      Date of Procedure: 04/18/2014      Preoperative diagnosis: Hx colon polyps      Postoperative diagnosis: colon polyps x 2, internal hemorrhoids ; non-bleeding cecal AVM      Procedure: Procedure(s):   COLONOSCOPY with cold forceps polypectomy      Operator:  Dr. Olivia Mackieanielle J Lake Cinquemani, MD      Assistant(s): Endoscopy Technician-1: Leane ParaEloisa Sodusta   Endoscopy Technician-2: Rosalita ChessmanLoleta S Bass   Endoscopy RN-1: Leotis Painebra L. Gordy LevanWalton      [id number]:None      Specimens:            ID  Type  Source  Tests  Collected by  Time  Destination             1 : descending colon polyp  Preservative  Colon, Descending    Olivia Mackieanielle J Samira Acero, MD  04/18/2014 1023  Pathology     2 : sigmoid polyp  Preservative  Sigmoid    Olivia Mackieanielle J Huntington Leverich, MD  04/18/2014 1023  Pathology           Findings: See printed and scanned procedure note      Complications: None      Dr. Olivia Mackieanielle J Tayquan Gassman, MD   04/18/2014  10:33 AM

## 2014-04-19 LAB — EKG, 12 LEAD, INITIAL
Atrial Rate: 76 {beats}/min
Calculated P Axis: 51 degrees
Calculated R Axis: 25 degrees
Calculated T Axis: 72 degrees
Diagnosis: NORMAL
P-R Interval: 142 ms
Q-T Interval: 392 ms
QRS Duration: 86 ms
QTC Calculation (Bezet): 441 ms
Ventricular Rate: 76 {beats}/min

## 2014-04-19 MED FILL — DIPRIVAN 10 MG/ML INTRAVENOUS EMULSION: 10 mg/mL | INTRAVENOUS | Qty: 20

## 2014-04-19 MED FILL — LIDOCAINE (PF) 20 MG/ML (2 %) IJ SOLN: 20 mg/mL (2 %) | INTRAMUSCULAR | Qty: 5

## 2014-06-12 DIAGNOSIS — R159 Full incontinence of feces: Secondary | ICD-10-CM | POA: Diagnosis not present

## 2014-06-12 DIAGNOSIS — E039 Hypothyroidism, unspecified: Secondary | ICD-10-CM | POA: Diagnosis not present

## 2014-06-12 DIAGNOSIS — Z6822 Body mass index (BMI) 22.0-22.9, adult: Secondary | ICD-10-CM | POA: Diagnosis not present

## 2014-06-26 DIAGNOSIS — R062 Wheezing: Secondary | ICD-10-CM | POA: Diagnosis not present

## 2014-06-26 DIAGNOSIS — R222 Localized swelling, mass and lump, trunk: Secondary | ICD-10-CM | POA: Diagnosis not present

## 2014-06-26 DIAGNOSIS — E039 Hypothyroidism, unspecified: Secondary | ICD-10-CM | POA: Diagnosis not present

## 2014-06-26 DIAGNOSIS — R61 Generalized hyperhidrosis: Secondary | ICD-10-CM | POA: Diagnosis not present

## 2014-06-26 DIAGNOSIS — Z6822 Body mass index (BMI) 22.0-22.9, adult: Secondary | ICD-10-CM | POA: Diagnosis not present

## 2014-07-12 DIAGNOSIS — E039 Hypothyroidism, unspecified: Secondary | ICD-10-CM | POA: Diagnosis not present

## 2014-07-12 DIAGNOSIS — R61 Generalized hyperhidrosis: Secondary | ICD-10-CM | POA: Diagnosis not present

## 2014-07-12 DIAGNOSIS — J302 Other seasonal allergic rhinitis: Secondary | ICD-10-CM | POA: Diagnosis not present

## 2014-07-12 DIAGNOSIS — Z6823 Body mass index (BMI) 23.0-23.9, adult: Secondary | ICD-10-CM | POA: Diagnosis not present

## 2014-07-19 ENCOUNTER — Encounter: Attending: Family Medicine | Primary: Family Medicine

## 2014-07-19 ENCOUNTER — Ambulatory Visit
Admit: 2014-07-19 | Discharge: 2014-07-19 | Payer: PRIVATE HEALTH INSURANCE | Attending: Family Medicine | Primary: Family Medicine

## 2014-07-19 ENCOUNTER — Encounter

## 2014-07-19 DIAGNOSIS — R61 Generalized hyperhidrosis: Secondary | ICD-10-CM | POA: Diagnosis not present

## 2014-07-19 DIAGNOSIS — R109 Unspecified abdominal pain: Secondary | ICD-10-CM | POA: Diagnosis not present

## 2014-07-19 DIAGNOSIS — Z6823 Body mass index (BMI) 23.0-23.9, adult: Secondary | ICD-10-CM | POA: Diagnosis not present

## 2014-07-19 DIAGNOSIS — Z0000 Encounter for general adult medical examination without abnormal findings: Secondary | ICD-10-CM

## 2014-07-19 MED ORDER — HYDROCODONE-ACETAMINOPHEN 5 MG-325 MG TAB
5-325 mg | ORAL_TABLET | Freq: Four times a day (QID) | ORAL | Status: DC | PRN
Start: 2014-07-19 — End: 2014-08-17

## 2014-07-19 NOTE — Progress Notes (Signed)
Pt is here for shoulder, knee, back pain, chronic.    Do you have an advance directive no  Do you want information on an advance directive no      1. Have you been to the ER, urgent care clinic since your last visit?  Hospitalized since your last visit?No    2. Have you seen or consulted any other health care providers outside of the [facility] since your last visit?  Include any pap smears or colon screening. No

## 2014-07-19 NOTE — Telephone Encounter (Signed)
Pt will be going traveling to [place] and she needs a tetanus shot and a malari pill before she leaves in Oct

## 2014-07-19 NOTE — Patient Instructions (Addendum)
A Healthy Lifestyle: Care Instructions  Your Care Instructions  A healthy lifestyle can help you feel good, stay at a healthy weight, and have plenty of energy for both work and play. A healthy lifestyle is something you can share with your whole family.  A healthy lifestyle also can lower your risk for serious health problems, such as high blood pressure, heart disease, and diabetes.  You can follow a few steps listed below to improve your health and the health of your family.  Follow-up care is a key part of your treatment and safety. Be sure to make and go to all appointments, and call your doctor if you are having problems. It???s also a good idea to know your test results and keep a list of the medicines you take.  How can you care for yourself at home?  ?? Do not eat too much sugar, fat, or fast foods. You can still have dessert and treats now and then. The goal is moderation.  ?? Start small to improve your eating habits. Pay attention to portion sizes, drink less juice and soda pop, and eat more fruits and vegetables.  ?? Eat a healthy amount of food. A 3-ounce serving of meat, for example, is about the size of a deck of cards. Fill the rest of your plate with vegetables and whole grains.  ?? Limit the amount of soda and sports drinks you have every day. Drink more water when you are thirsty.  ?? Eat at least 5 servings of fruits and vegetables every day. It may seem like a lot, but it is not hard to reach this goal. A serving or helping is 1 piece of fruit, 1 cup of vegetables, or 2 cups of leafy, raw vegetables. Have an apple or some carrot sticks as an afternoon snack instead of a candy bar. Try to have fruits and/or vegetables at every meal.  ?? Make exercise part of your daily routine. You may want to start with simple activities, such as walking, bicycling, or slow swimming. Try to be active 30 to 60 minutes every day. You do not need to do all 30 to 60  minutes all at once. For example, you can exercise 3 times a day for 10 or 20 minutes. Moderate exercise is safe for most people, but it is always a good idea to talk to your doctor before starting an exercise program.  ?? Keep moving. Mow the lawn, work in the garden, or clean your house. Take the stairs instead of the elevator at work.  ?? If you smoke, quit. People who smoke have an increased risk for heart attack, stroke, cancer, and other lung illnesses. Quitting is hard, but there are ways to boost your chance of quitting tobacco for good.  ?? Use nicotine gum, patches, or lozenges.  ?? Ask your doctor about stop-smoking programs and medicines.  ?? Keep trying.  In addition to reducing your risk of diseases in the future, you will notice some benefits soon after you stop using tobacco. If you have shortness of breath or asthma symptoms, they will likely get better within a few weeks after you quit.  ?? Limit how much alcohol you drink. Moderate amounts of alcohol (up to 2 drinks a day for men, 1 drink a day for women) are okay. But drinking too much can lead to liver problems, high blood pressure, and other health problems.  Family health  If you have a family, there are many things you can do   together to improve your health.  ?? Eat meals together as a family as often as possible.  ?? Eat healthy foods. This includes fruits, vegetables, lean meats and dairy, and whole grains.  ?? Include your family in your fitness plan. Most people think of activities such as jogging or tennis as the way to fitness, but there are many ways you and your family can be more active. Anything that makes you breathe hard and gets your heart pumping is exercise. Here are some tips:  ?? Walk to do errands or to take your child to school or the bus.  ?? Go for a family bike ride after dinner instead of watching TV.   Where can you learn more?   Go to http://www.healthwise.net/[facility]   Enter U807 in the search box to learn more about "A Healthy Lifestyle: Care Instructions."   ?? 2006-2015 Healthwise, Incorporated. Care instructions adapted under license by [employer] (which disclaims liability or warranty for this information). This care instruction is for use with your licensed healthcare professional. If you have questions about a medical condition or this instruction, always ask your healthcare professional. Healthwise, Incorporated disclaims any warranty or liability for your use of this information.  Content Version: 10.7.482551; Current as of: March 31, 2013        Follow-up Disposition:   Return if symptoms worsen or fail to improve.   Risk and benefits of new medication discussed in detail when indicated, patient was given the opportunity to ask questions   AVS provided  reviewed diet, exercise and weight control when indicated  Alarm signals discussed. ER precautions reviewed when indicated  Plan of care reviewed with patient. Understanding verbalized and they are in agreement with plan of care.     Teresa R Johnson, DO

## 2014-07-20 ENCOUNTER — Inpatient Hospital Stay: Admit: 2014-07-20 | Payer: BLUE CROSS/BLUE SHIELD | Primary: Family Medicine

## 2014-07-20 LAB — METABOLIC PANEL, COMPREHENSIVE
A-G Ratio: 1.3 (ref 0.8–1.7)
ALT (SGPT): 22 U/L (ref 13–56)
AST (SGOT): 12 U/L — ABNORMAL LOW (ref 15–37)
Albumin: 4 g/dL (ref 3.4–5.0)
Alk. phosphatase: 64 U/L (ref 45–117)
Anion gap: 9 mmol/L (ref 3.0–18)
BUN/Creatinine ratio: 16 (ref 12–20)
BUN: 11 MG/DL (ref 7.0–18)
Bilirubin, total: 0.3 MG/DL (ref 0.2–1.0)
CO2: 27 mmol/L (ref 21–32)
Calcium: 8.8 MG/DL (ref 8.5–10.1)
Chloride: 107 mmol/L (ref 100–108)
Creatinine: 0.68 MG/DL (ref 0.6–1.3)
GFR est AA: >60 mL/min/{1.73_m2} (ref >60)
GFR est non-AA: >60 mL/min/{1.73_m2} (ref >60)
Globulin: 3.1 g/dL (ref 2.0–4.0)
Glucose: 173 mg/dL — ABNORMAL HIGH (ref 74–99)
Potassium: 4.2 mmol/L (ref 3.5–5.5)
Protein, total: 7.1 g/dL (ref 6.4–8.2)
Sodium: 143 mmol/L (ref 136–145)

## 2014-07-20 LAB — TSH 3RD GENERATION: TSH: 1.88 u[IU]/mL (ref 0.36–3.74)

## 2014-07-20 LAB — CBC WITH AUTOMATED DIFF
ABS. BASOPHILS: 0 10*3/uL (ref 0.0–0.06)
ABS. EOSINOPHILS: 0.1 10*3/uL (ref 0.0–0.4)
ABS. LYMPHOCYTES: 1.9 10*3/uL (ref 0.9–3.6)
ABS. MONOCYTES: 0.4 10*3/uL (ref 0.05–1.2)
ABS. NEUTROPHILS: 4.4 10*3/uL (ref 1.8–8.0)
BASOPHILS: 1 % (ref 0–2)
EOSINOPHILS: 2 % (ref 0–5)
HCT: 45.5 % — ABNORMAL HIGH (ref 35.0–45.0)
HGB: 13.7 g/dL (ref 12.0–16.0)
LYMPHOCYTES: 28 % (ref 21–52)
MCH: 26.9 PG (ref 24.0–34.0)
MCHC: 30.1 g/dL — ABNORMAL LOW (ref 31.0–37.0)
MCV: 89.2 FL (ref 74.0–97.0)
MONOCYTES: 6 % (ref 3–10)
MPV: 13.3 FL — ABNORMAL HIGH (ref 9.2–11.8)
NEUTROPHILS: 63 % (ref 40–73)
PLATELET: 184 10*3/uL (ref 135–420)
RBC: 5.1 M/uL (ref 4.20–5.30)
RDW: 14.2 % (ref 11.6–14.5)
WBC: 6.9 10*3/uL (ref 4.6–13.2)

## 2014-07-20 LAB — HEMOGLOBIN A1C WITH EAG
Est. average glucose: 189 mg/dL
Hemoglobin A1c: 8.2 % — ABNORMAL HIGH (ref 4.2–5.6)

## 2014-07-20 LAB — VITAMIN D, 25 HYDROXY: Vitamin D 25-Hydroxy: 12.9 ng/mL — ABNORMAL LOW (ref 30–100)

## 2014-07-20 LAB — LIPID PANEL
CHOL/HDL Ratio: 3.2 (ref 0–5.0)
Cholesterol, total: 158 MG/DL (ref <200)
HDL Cholesterol: 49 MG/DL (ref 40–60)
LDL, calculated: 82 MG/DL (ref 0–100)
Triglyceride: 135 MG/DL (ref <150)
VLDL, calculated: 27 MG/DL

## 2014-07-22 NOTE — Progress Notes (Signed)
HISTORY OF PRESENT ILLNESS  Cheryl Paul is a 64 y.o. female.  HPI Comments: 64 year old established female patient in today with an acute concern.    Patient reports b/l knee pain.  She reports chronic pain in her back and thigh also.  She occasional uses a walker  She reports that tramadol has not been beneficial.  Patient reports that she has seen ortho but needs to loose weight prior to surgery.  She has reported a 20 lbs weight loss but reports she needs to loose another 20 lbs.  Pain is 9/10    She is requesting weight loss help and pain control    She has no concerns and reports that she is doing well.  She is taking her medications with no adverse side effects, she is requesting refills today.  She denies CP, SOB, dyspnea, edema, N/T or myalgias.        Allergies   Allergen Reactions   ??? Iodine Rash   ??? Lactose Diarrhea     Lactose intolerance       Past Medical History   Diagnosis Date   ??? Asthma    ??? Diabetes (HCC)    ??? Hypercholesterolemia    ??? Hypertension    ??? H/O sinusitis    ??? Arthritis      knee, arms,    ??? Toe fracture, left August 2015     4th toe   ??? Chronic obstructive pulmonary disease (HCC)        Family History   Problem Relation Age of Onset   ??? Hypertension Mother    ??? Stroke Mother    ??? Diabetes Mother    ??? Thyroid Disease Mother    ??? Arthritis-rheumatoid Mother    ??? Cancer Father      colon polpe       History   Substance Use Topics   ??? Smoking status: Never Smoker    ??? Smokeless tobacco: Never Used   ??? Alcohol Use: No        Current Outpatient Prescriptions   Medication Sig   ??? HYDROcodone-acetaminophen (NORCO) 5-325 mg per tablet Take 1 Tab by mouth every six (6) hours as needed for Pain. Max Daily Amount: 4 Tabs.   ??? atorvastatin (LIPITOR) 20 mg tablet TAKE ONE TABLET BY MOUTH ONE TIME DAILY    ??? lisinopril (PRINIVIL, ZESTRIL) 10 mg tablet Take 1 Tab by mouth daily.   ??? ADVOCATE REDI-CODE+ strip    ??? ADVOCATE REDI-CODE+ CTRL LOW soln    ??? ULTRA THIN LANCETS 31 gauge misc     ??? budesonide-formoterol (SYMBICORT) 80-4.5 mcg/actuation HFAA inhaler Take 2 Puffs by inhalation two (2) times a day.   ??? metFORMIN (GLUCOPHAGE) 1,000 mg tablet Take 1 Tab by mouth two (2) times daily (with meals).   ??? glipiZIDE (GLUCOTROL) 5 mg tablet Take  by mouth two (2) times a day.   ??? clotrimazole-betamethasone (LOTRISONE) 1-0.05 % lotion Apply  to affected area two (2) times a day.   ??? albuterol (VENTOLIN HFA) 90 mcg/actuation inhaler Take  by inhalation.     No current facility-administered medications for this visit.        Past Surgical History   Procedure Laterality Date   ??? Pr abdomen surgery proc unlisted       4 surguries for blockages   ??? Hx hernia repair       abdomen   ??? Hx cholecystectomy     ??? Hx  gyn     ??? Hx hysterectomy       tubes tied   ??? Hx carpal tunnel release Bilateral    ??? Hx polypectomy  04/18/14       Review of Systems   Constitutional:        C/o increased weight   Musculoskeletal: Positive for back pain.        C/o b/l knee pain   Neurological: Negative for tingling.     BP 138/96 mmHg   Pulse 82   Temp(Src) 98 ??F (36.7 ??C) (Oral)   Resp 16   Ht  (1.6 m)   Wt 279 lb (126.554 kg)   BMI 49.44 kg/m2   SpO2 98%  Physical Exam  Constitutional: Obese habitus.  She is oriented to person, place, and time and in no distress.   Head: Normocephalic and atraumatic.   Right Ear: External ear normal.   Left Ear: External ear normal.   Eyes: Pupils are equal, round, and reactive to light.   Neck: Normal range of motion.   Cardiovascular: Normal rate, regular rhythm, normal heart sounds and intact distal pulses.    No murmur heard.  Pulmonary/Chest: Effort normal and breath sounds normal. No respiratory distress.   Musculoskeletal: +  Trace edema.  Normal range of motion.   Neurological: She is alert and oriented to person, place, and time. Gait normal.   Skin: Skin is warm and dry.   Psychiatric: Mood, memory, affect and judgment normal.     ASSESSMENT and PLAN    ICD-10-CM ICD-9-CM     1. Morbid obesity, unspecified obesity type (HCC) E66.01 278.01    2. Type 2 diabetes mellitus without complication (HCC) E11.9 250.00 HEMOGLOBIN A1C   3. Arthritis, multiple joint involvement M12.9 716.99 HYDROcodone-acetaminophen (NORCO) 5-325 mg per tablet   4. Chronic obstructive pulmonary disease, unspecified COPD type (HCC) J44.9 496    5. Essential hypertension I10 401.9 METABOLIC PANEL, COMPREHENSIVE   6. Hyperlipidemia, unspecified hyperlipidemia type E78.5 272.4 LIPID PANEL   7. Vitamin D deficiency E55.9 268.9 VITAMIN D, 25 HYDROXY   8. Laboratory exam ordered as part of routine general medical examination Z00.00 V72.62 [id number] - [facility] COLLECTION      CBC WITH AUTOMATED DIFF      METABOLIC PANEL, COMPREHENSIVE      LIPID PANEL      TSH, 3RD GENERATION      VITAMIN D, 25 HYDROXY      [id number] [facility] DRAW FEE      HEMOGLOBIN A1C     -Labs reviewed with patient today.  -DM uncontrolled A1C 8.2 improved from 9.2, patient advised to ctn glipizide, metformin and dietary modification.  Will consider adding GLP-1 receptor agonist on next visit.  -HTN needs improvement but patient reports pain today which is likely contributing will add a second agent if BP remains elevated on follow up visit.  -Advised better pain control with norco and follow up with ortho   -Recommend medical weight loss and patient provided with information today  -RTC 1 month follow up HTN    Follow-up Disposition:   Return if symptoms worsen or fail to improve.   Risk and benefits of new medication discussed in detail when indicated, patient was given the opportunity to ask questions   AVS provided  reviewed diet, exercise and weight control when indicated  Alarm signals discussed. ER precautions reviewed when indicated  Plan of care reviewed with patient. Understanding verbalized and they are in agreement  with plan of care.     Lemont Fillers, DO

## 2014-07-23 ENCOUNTER — Encounter

## 2014-07-23 NOTE — Telephone Encounter (Signed)
Last OB 07/19/14  Next OV 08/21/14

## 2014-07-24 DIAGNOSIS — Z9049 Acquired absence of other specified parts of digestive tract: Secondary | ICD-10-CM | POA: Diagnosis not present

## 2014-07-24 DIAGNOSIS — R109 Unspecified abdominal pain: Secondary | ICD-10-CM | POA: Diagnosis not present

## 2014-07-24 DIAGNOSIS — Z9889 Other specified postprocedural states: Secondary | ICD-10-CM | POA: Diagnosis not present

## 2014-07-24 MED ORDER — METFORMIN 1,000 MG TAB
1000 mg | ORAL_TABLET | Freq: Two times a day (BID) | ORAL | Status: DC
Start: 2014-07-24 — End: 2015-07-16

## 2014-08-14 DIAGNOSIS — K219 Gastro-esophageal reflux disease without esophagitis: Secondary | ICD-10-CM | POA: Diagnosis not present

## 2014-08-14 DIAGNOSIS — R159 Full incontinence of feces: Secondary | ICD-10-CM | POA: Diagnosis not present

## 2014-08-16 NOTE — Telephone Encounter (Signed)
C/o lab results from 07/19/14 and the pain med norco not helping with pain it only makes the patient sleepy.

## 2014-08-17 ENCOUNTER — Encounter

## 2014-08-17 DIAGNOSIS — A09 Infectious gastroenteritis and colitis, unspecified: Secondary | ICD-10-CM | POA: Diagnosis not present

## 2014-08-17 DIAGNOSIS — R197 Diarrhea, unspecified: Secondary | ICD-10-CM | POA: Diagnosis not present

## 2014-08-17 MED ORDER — ERGOCALCIFEROL (VITAMIN D2) 50,000 UNIT CAP
1250 mcg (50,000 unit) | ORAL_CAPSULE | ORAL | Status: AC
Start: 2014-08-17 — End: 2014-10-06

## 2014-08-17 MED ORDER — SITAGLIPTIN 25 MG TAB
25 mg | ORAL_TABLET | Freq: Every day | ORAL | Status: DC
Start: 2014-08-17 — End: 2015-09-26

## 2014-08-17 MED ORDER — OXYCODONE-ACETAMINOPHEN 5 MG-325 MG TAB
5-325 mg | ORAL_TABLET | ORAL | Status: DC | PRN
Start: 2014-08-17 — End: 2014-11-08

## 2014-08-17 NOTE — Progress Notes (Signed)
Patient returned my call, advised of most recently labs including uncontrolled DM and vit D deficiency.  Will send new medicines to pharmacy for better control of both.  Patient agrees.  She reports previously seeing ortho but says she would like to be seen at a closer location, advised that referral was in.  Patient provided with script for percocet today, she is aware that it is at the office ready for pick up.

## 2014-08-17 NOTE — Telephone Encounter (Signed)
Called patient but no response.  Non emergent VM left asking patient to call back

## 2014-08-20 NOTE — Telephone Encounter (Signed)
Pt states she needs a letter that states she is under your care. Also she needs a malari and tetanus shot. Pt states she was told by Dr Delford FieldWright she may need to go to a clinic or pharmacy for malaria shot. Pt states they need a rx prescription for immunizations.  Thanks

## 2014-08-21 ENCOUNTER — Encounter: Attending: Family Medicine | Primary: Family Medicine

## 2014-09-03 ENCOUNTER — Ambulatory Visit
Admit: 2014-09-03 | Discharge: 2014-09-03 | Payer: PRIVATE HEALTH INSURANCE | Attending: Specialist | Primary: Family Medicine

## 2014-09-03 DIAGNOSIS — M170 Bilateral primary osteoarthritis of knee: Secondary | ICD-10-CM

## 2014-09-03 MED ORDER — BETAMETHASONE ACET & SOD PHOS 6 MG/ML SUSP FOR INJECTION
6 mg/mL | Freq: Once | INTRAMUSCULAR | Status: AC
Start: 2014-09-03 — End: 2014-09-03

## 2014-09-03 MED ORDER — BUPIVACAINE (PF) 0.25 % (2.5 MG/ML) IJ SOLN
0.25 % (2.5 mg/mL) | Freq: Once | INTRAMUSCULAR | Status: AC
Start: 2014-09-03 — End: 2014-09-03

## 2014-09-03 NOTE — Progress Notes (Signed)
Chief Complaint   Patient presents with   ??? Knee Pain     bil   ??? Shoulder Pain     bil

## 2014-09-03 NOTE — Progress Notes (Signed)
Patient: Cheryl Paul                MRN: [id number]       SSN: [id number]  Date of Birth: [birth year]        AGE: 64 y.o.        SEX: female      PCP: Langston Maskereresa R Wright, DO  09/03/2014    Chief Complaint   Patient presents with   ??? Knee Pain     bil   ??? Shoulder Pain     bil       HISTORY:  Cheryl Paul is a 64 y.o. female who is seen for bilateral shoulder and knee pain.   She has had ongoing knee pain for the past few years.  She states previous x-rays of her knees showed severe osteoarthritis.     She was previously seen [facility] by Dr. Hollice EspyGibson about two years ago.  She states she had previous cortisone injections.  She has difficulty standing and sitting.  She previously saw a neurosurgereon who discussed doing an laminectomy.  She has posterior burning sensations in her knees.  She has shoulder pain radiating into her upper arms.      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  She retired from the [employer] in 2008 after 35 years.  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  She has two great grandchildren ages 2610 and 513.  She has 5 grandchildren.    Last 3 Recorded Weights in this Encounter    09/03/14 1030   Weight: 279 lb (126.554 kg)     Body mass index is 49.44 kg/(m^2).    Patient Active Problem List   Diagnosis Code   ??? 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 Type 2 diabetes mellitus without complication (HCC) E11.9   ??? Incontinence in female N39.3   ??? Candidal intertrigo B37.2   ??? 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 Essential hypertension I10   ??? COPD (chronic obstructive pulmonary disease) (HCC) J44.9   ??? Hyperlipidemia E78.5   ??? Vitamin D deficiency E55.9   ??? Internal hemorrhoids K64.8   ??? Colon polyps K63.5       REVIEW OF SYSTEMS: All Below are Negative except: See HPI     Constitutional: negative for fever, chills, and weight loss.    Cardiovascular: negative for chest pain, claudication, leg swelling, SOB, DOE   Gastrointestinal: Negative for pain, N/V/C/D, Blood in stool or urine, dysuria,  hematuria, incontinence, pelvic pain.   Musculoskeletal: See HPI   Neurological: Negative for dizziness and weakness.   Negative for headaches, Visual changes, confusion, seizures   Phychiatric/Behavioral: Negative for depression, memory loss, substance  abuse.    Extremities: Negative for hair changes, rash, or skin lesion changes.   Hematologic: Negative for bleeding problems, bruising, pallor or swollen lymph  nodes   Peripheral Vascular: No calf pain, no circulation deficits.    History     Social History   ??? Marital Status: SINGLE     Spouse Name: N/A   ??? Number of Children: N/A   ??? Years of Education: N/A     Occupational History   ??? Not on file.     Social History Main Topics   ??? Smoking status: Never Smoker    ??? Smokeless tobacco: Never Used   ??? Alcohol Use: No   ??? Drug  Use: No   ??? Sexual Activity: Not on file     Other Topics Concern   ??? 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 Denies history of smoking         Allergies   Allergen Reactions   ??? Iodine Rash   ??? Lactose Diarrhea     Lactose intolerance        Current Outpatient Prescriptions   Medication Sig   ??? betamethasone (CELESTONE SOLUSPAN) 6 mg/mL injection 0.5 mL by Intra artICUlar route once for 1 dose.   ??? bupivacaine, PF, (MARCAINE, PF,) 0.25 % (2.5 mg/mL) injection 4 mL by Intra artICUlar route once for 1 dose.   ??? ergocalciferol (ERGOCALCIFEROL) 50,000 unit capsule Take 1 Cap by mouth every seven (7) days for 8 doses.   ??? oxyCODONE-acetaminophen (PERCOCET) 5-325 mg per tablet Take 1 Tab by mouth every four (4) hours as needed for Pain. Max Daily Amount: 6 Tabs.   ??? sitaGLIPtin (JANUVIA) 25 mg tablet Take 1 Tab by mouth daily.   ??? metFORMIN (GLUCOPHAGE) 1,000 mg tablet Take 1 Tab by mouth two (2) times  daily (with meals).   ??? atorvastatin (LIPITOR) 20 mg tablet TAKE ONE TABLET BY MOUTH ONE TIME DAILY    ??? lisinopril (PRINIVIL, ZESTRIL) 10 mg tablet Take 1 Tab by mouth daily.   ??? ADVOCATE REDI-CODE+ strip    ??? ADVOCATE REDI-CODE+ CTRL LOW soln    ??? ULTRA THIN LANCETS 31 gauge misc    ??? budesonide-formoterol (SYMBICORT) 80-4.5 mcg/actuation HFAA inhaler Take 2 Puffs by inhalation two (2) times a day.   ??? glipiZIDE (GLUCOTROL) 5 mg tablet Take  by mouth two (2) times a day.   ??? clotrimazole-betamethasone (LOTRISONE) 1-0.05 % lotion Apply  to affected area two (2) times a day.   ??? albuterol (VENTOLIN HFA) 90 mcg/actuation inhaler Take  by inhalation.     No current facility-administered medications for this visit.        PHYSICAL EXAMINATION:  BP 134/80 mmHg   Pulse 74   Temp(Src) 97.9 ??F (36.6 ??C)   Ht  (1.6 m)   Wt 279 lb (126.554 kg)   BMI 49.44 kg/m2     ORTHO EXAMINATION:  Examination Left knee Right knee   Skin Intact Intact   Range of motion 95-5 95-5   Effusion - -   Medial joint line tenderness + +   Lateral joint line tenderness - -   Popliteal tenderness - -   Osteophytes palpable +, medial  +, medial   McMurray???s - -   Patella crepitus + +   Anterior drawer - -   Lateral laxity - -   Medial laxity - -   Varus deformity + +   Valgus deformity - -   Pretibial edema 2+ 2+   Calf tenderness - -     Examination Right shoulder Left shoulder   Skin Intact Intact   Effusion - -   Biceps deformity - -   Atrophy - -   AC joint tenderness - -   Acromial tenderness + +   Biceps tenderness - -   Forward flexion/Elevation ROM 175 175   Active abduction ROM 160 160   External rotation ROM 90 90   Internal rotation ROM 70 70   Apprehension - -   Impingement - -   Drop Arm Test - -   Neurovascular  Intact Intact         PROCEDURE: Patient's knees were injected with 4 cc Marcaine and 1/2 cc Celestone.      Chart reviewed for the following:   I, Tonia Brooms, MD, have reviewed the History, Physical and updated  the Allergic reactions for Cheryl Maw     TIME OUT performed immediately prior to start of procedure:  I, Tonia Brooms, MD, have performed the following reviews on Cheryl Paul prior to the start of the procedure:            * Patient was identified by name and date of birth   * Agreement on procedure being performed was verified  * Risks and Benefits explained to the patient  * Procedure site verified and marked as necessary  * Patient was positioned for comfort  * Consent was obtained     Time: 11:41 AM     Date of procedure: 09/03/2014    Procedure performed by:  Tonia Brooms, MD    Cheryl Paul tolerated the procedure well with no complications.      RADIOLOGY: Three views of knees: no fractures, no effusion, severe joint space narrowing, tricompartmental osteophytes present, varus deformity.       IMPRESSION:      ICD-10-CM ICD-9-CM    1. Primary osteoarthritis of both knees M17.0 715.16 betamethasone (CELESTONE SOLUSPAN) 6 mg/mL injection      BETAMETHASONE ACETATE & SODIUM PHOSPHATE INJECTION 3 MG EA.      DRAIN/INJECT LARGE JOINT/BURSA      bupivacaine, PF, (MARCAINE, PF,) 0.25 % (2.5 mg/mL) injection      PROCEDURE AUTHORIZATION TO SCHEDULER      AMB POC X-RAY KNEE 3 VIEW   2. Chronic pain of both knees M25.561 719.46 betamethasone (CELESTONE SOLUSPAN) 6 mg/mL injection    M25.562 338.29 BETAMETHASONE ACETATE & SODIUM PHOSPHATE INJECTION 3 MG EA.    G89.29  DRAIN/INJECT LARGE JOINT/BURSA      bupivacaine, PF, (MARCAINE, PF,) 0.25 % (2.5 mg/mL) injection      PROCEDURE AUTHORIZATION TO SCHEDULER      AMB POC X-RAY KNEE 3 VIEW   3. Chronic pain of both shoulders M25.512 719.41     G89.29 338.29     M25.511         PLAN:  Cheryl Paul knees were injected 4 cc 0.25% Marcaine and 0.5 cc Celestone.   I will see her back in about one month.   Consider viscosupplementation if pain continues.      Scribed by Beverly Sessions (Scribekick) as dictated by Tonia Brooms, MD

## 2014-09-03 NOTE — Patient Instructions (Signed)
Joint Injections: Care Instructions  Your Care Instructions  Joint injections are shots into a joint, such as the knee. They may be used to put in medicines, such as pain relievers. Or they can be used to take out fluid. Sometimes the fluid is tested in a lab. This can help find the cause of a joint problem.  A corticosteroid, or steroid, shot is used to reduce inflammation in tendons or joints. It is often used to treat problems such as arthritis, tendinitis, and bursitis.  Steroids can be injected directly into a painful, inflamed joint. They can also help reduce inflammation of a bursa. A bursa is a sac of fluid. It cushions and lubricates areas where tendons, ligaments, skin, muscles, or bones rub against each other.  A steroid shot can sometimes help with short-term pain relief when other treatments haven't worked. If steroid shots help, pain may improve for weeks or months.  Follow-up care is a key part of your treatment and safety. Be sure to make and go to all appointments, and call your doctor if you are having problems. It's also a good idea to know your test results and keep a list of the medicines you take.  How can you care for yourself at home?  ?? Put ice or a cold pack on the area for 10 to 20 minutes at a time. Put a thin cloth between the ice and your skin.  ?? Take anti-inflammatory medicines to reduce pain, swelling, or inflammation. These include ibuprofen (Advil, Motrin) and naproxen (Aleve). Read and follow all instructions on the label.  ?? Avoid strenuous activities for several days, especially those that put stress on the area where you got the shot.  ?? If you have dressings over the area, keep them clean and dry. You may remove them when your doctor tells you to.  When should you call for help?  Call your doctor now or seek immediate medical care if:  ?? You have signs of infection, such as:  ?? Increased pain, swelling, warmth, or redness.  ?? Red streaks leading from the site.   ?? Pus draining from the site.  ?? A fever.  Watch closely for changes in your health, and be sure to contact your doctor if you have any problems.   Where can you learn more?   Go to http://www.healthwise.net/[facility]  Enter N616 in the search box to learn more about "Joint Injections: Care Instructions."   ?? 2006-2015 Healthwise, Incorporated. Care instructions adapted under license by [place] (which disclaims liability or warranty for this information). This care instruction is for use with your licensed healthcare professional. If you have questions about a medical condition or this instruction, always ask your healthcare professional. Healthwise, Incorporated disclaims any warranty or liability for your use of this information.  Content Version: 10.7.482551; Current as of: Oct 06, 2013

## 2014-09-04 NOTE — Telephone Encounter (Signed)
Received procedure request for bilateral Euflexxa injections/pt only seen once/need further documentation, failed steroid, NSAID use, non pharmocologic failure, such as, PT, home exercise, etc.

## 2014-09-04 NOTE — Telephone Encounter (Signed)
Pt informed and told her to RTO if further treatment is needed

## 2014-09-06 NOTE — Telephone Encounter (Signed)
Pt called in she went to Dr. Liliane ChannelBlasdell office and he wanted her to be seen by Dr.Barrett for a gastic bypass patient wants to know if this is okay with Dr.Wright she would like to speak to Dr.Wright about this

## 2014-09-07 NOTE — Telephone Encounter (Signed)
Please advise patient that I would love to speak with her regarding her concerns.  Unfortunately I may not be able to get back with her via phone until Monday but if she would like to communicate through MyChart I may be able to get her a response sooner.

## 2014-09-07 NOTE — Telephone Encounter (Signed)
Patient called again regarding status of message below. She said she made an appointment with Dr Beverely LowElizabeth Barrett [facility] for 09/27/2014    She wants Dr Delford FieldWright to be aware and speak with her about this

## 2014-09-07 NOTE — Telephone Encounter (Signed)
Patient states she will send a My Chart message to Dr. Delford FieldWright.

## 2014-09-10 DIAGNOSIS — R109 Unspecified abdominal pain: Secondary | ICD-10-CM | POA: Diagnosis not present

## 2014-09-10 DIAGNOSIS — Z6823 Body mass index (BMI) 23.0-23.9, adult: Secondary | ICD-10-CM | POA: Diagnosis not present

## 2014-09-10 DIAGNOSIS — J302 Other seasonal allergic rhinitis: Secondary | ICD-10-CM | POA: Diagnosis not present

## 2014-09-10 DIAGNOSIS — N951 Menopausal and female climacteric states: Secondary | ICD-10-CM | POA: Diagnosis not present

## 2014-09-12 NOTE — Telephone Encounter (Signed)
Patient called last week and request information on gastric bypass surgery which has been recommended to her for 70lbs weight loss prior to knee replacement surgery.  I returned her call today.    Discussed pertinent alternatives for weight loss including medical weight loss, discussed some of the resources offered to help patient loose weight and provided her with the number to [facility] weight loss program.  Patient expressed her gratitude.

## 2014-09-13 ENCOUNTER — Encounter

## 2014-09-13 MED ORDER — GLIPIZIDE 5 MG TAB
5 mg | ORAL_TABLET | Freq: Two times a day (BID) | ORAL | Status: DC
Start: 2014-09-13 — End: 2014-12-10

## 2014-09-13 MED ORDER — ATORVASTATIN 20 MG TAB
20 mg | ORAL_TABLET | ORAL | Status: DC
Start: 2014-09-13 — End: 2014-12-10

## 2014-09-13 NOTE — Telephone Encounter (Signed)
Last OV 07/19/14

## 2014-09-13 NOTE — Telephone Encounter (Signed)
Lipitor and Glipizide refilled and sent to pharmacy. Please notify pt.

## 2014-09-24 DIAGNOSIS — Z6824 Body mass index (BMI) 24.0-24.9, adult: Secondary | ICD-10-CM | POA: Diagnosis not present

## 2014-09-24 DIAGNOSIS — N952 Postmenopausal atrophic vaginitis: Secondary | ICD-10-CM | POA: Diagnosis not present

## 2014-09-24 NOTE — Telephone Encounter (Signed)
Patient was called to confirm appointment. Unable to reach. Left voicemail to return call.

## 2014-09-27 ENCOUNTER — Encounter: Attending: Surgery | Primary: Family Medicine

## 2014-09-27 NOTE — Telephone Encounter (Signed)
Left a message for Mrs. Cheryl Paul to reschedule her appointment and to make sure that all is ok. Will try again later to schedule appointment. JDG

## 2014-10-01 NOTE — Telephone Encounter (Signed)
Talked to Cheryl Paul and she stated to me that " she is going to just continue with the current provider that she has." " She doesn't want to start all the way over and lose what she has started with the other provider."...JDG

## 2014-10-04 ENCOUNTER — Other Ambulatory Visit: Payer: Self-pay | Admitting: Obstetrics and Gynecology

## 2014-10-04 DIAGNOSIS — Z124 Encounter for screening for malignant neoplasm of cervix: Secondary | ICD-10-CM | POA: Diagnosis not present

## 2014-10-04 DIAGNOSIS — Z118 Encounter for screening for other infectious and parasitic diseases: Secondary | ICD-10-CM | POA: Diagnosis not present

## 2014-10-04 DIAGNOSIS — Z113 Encounter for screening for infections with a predominantly sexual mode of transmission: Secondary | ICD-10-CM | POA: Diagnosis not present

## 2014-10-04 DIAGNOSIS — Z6823 Body mass index (BMI) 23.0-23.9, adult: Secondary | ICD-10-CM | POA: Diagnosis not present

## 2014-10-04 DIAGNOSIS — N771 Vaginitis, vulvitis and vulvovaginitis in diseases classified elsewhere: Secondary | ICD-10-CM | POA: Diagnosis not present

## 2014-10-05 DIAGNOSIS — G603 Idiopathic progressive neuropathy: Secondary | ICD-10-CM | POA: Diagnosis not present

## 2014-10-05 DIAGNOSIS — G5600 Carpal tunnel syndrome, unspecified upper limb: Secondary | ICD-10-CM | POA: Diagnosis not present

## 2014-10-05 DIAGNOSIS — K219 Gastro-esophageal reflux disease without esophagitis: Secondary | ICD-10-CM | POA: Diagnosis not present

## 2014-10-05 DIAGNOSIS — R1013 Epigastric pain: Secondary | ICD-10-CM | POA: Diagnosis not present

## 2014-10-05 LAB — CYTOLOGY - PAP

## 2014-10-08 ENCOUNTER — Other Ambulatory Visit: Payer: Self-pay

## 2014-10-08 DIAGNOSIS — K319 Disease of stomach and duodenum, unspecified: Secondary | ICD-10-CM | POA: Diagnosis not present

## 2014-10-08 DIAGNOSIS — K2900 Acute gastritis without bleeding: Secondary | ICD-10-CM | POA: Diagnosis not present

## 2014-10-08 DIAGNOSIS — E039 Hypothyroidism, unspecified: Secondary | ICD-10-CM | POA: Diagnosis not present

## 2014-10-08 DIAGNOSIS — Z9884 Bariatric surgery status: Secondary | ICD-10-CM | POA: Diagnosis not present

## 2014-10-08 DIAGNOSIS — F419 Anxiety disorder, unspecified: Secondary | ICD-10-CM | POA: Diagnosis not present

## 2014-10-08 DIAGNOSIS — K219 Gastro-esophageal reflux disease without esophagitis: Secondary | ICD-10-CM | POA: Diagnosis not present

## 2014-10-08 DIAGNOSIS — K2950 Unspecified chronic gastritis without bleeding: Secondary | ICD-10-CM | POA: Diagnosis not present

## 2014-10-08 DIAGNOSIS — R1013 Epigastric pain: Secondary | ICD-10-CM | POA: Diagnosis not present

## 2014-10-11 DIAGNOSIS — R202 Paresthesia of skin: Secondary | ICD-10-CM | POA: Diagnosis not present

## 2014-10-16 DIAGNOSIS — M545 Low back pain: Secondary | ICD-10-CM | POA: Diagnosis not present

## 2014-10-25 ENCOUNTER — Other Ambulatory Visit: Payer: Self-pay | Admitting: Obstetrics and Gynecology

## 2014-10-25 DIAGNOSIS — N762 Acute vulvitis: Secondary | ICD-10-CM | POA: Diagnosis not present

## 2014-10-25 DIAGNOSIS — N760 Acute vaginitis: Secondary | ICD-10-CM | POA: Diagnosis not present

## 2014-11-08 ENCOUNTER — Encounter

## 2014-11-08 NOTE — Telephone Encounter (Signed)
Pt called in requesting refill of her   Requested Prescriptions     Pending Prescriptions Disp Refills   ??? oxyCODONE-acetaminophen (PERCOCET) 5-325 mg per tablet 60 Tab 0     Sig: Take 1 Tab by mouth every four (4) hours as needed for Pain. Max Daily Amount: 6 Tabs.   .

## 2014-11-08 NOTE — Telephone Encounter (Signed)
Last OV 09/03/14  No future appts scheduled

## 2014-11-09 MED ORDER — OXYCODONE-ACETAMINOPHEN 5 MG-325 MG TAB
5-325 mg | ORAL_TABLET | ORAL | Status: DC | PRN
Start: 2014-11-09 — End: 2015-01-28

## 2014-11-09 NOTE — Telephone Encounter (Signed)
Pt aware written RX is ready for pickup.

## 2014-11-21 DIAGNOSIS — M4806 Spinal stenosis, lumbar region: Secondary | ICD-10-CM | POA: Diagnosis not present

## 2014-11-21 DIAGNOSIS — M545 Low back pain: Secondary | ICD-10-CM | POA: Diagnosis not present

## 2014-12-01 DIAGNOSIS — M4806 Spinal stenosis, lumbar region: Secondary | ICD-10-CM | POA: Diagnosis not present

## 2014-12-03 DIAGNOSIS — M5126 Other intervertebral disc displacement, lumbar region: Secondary | ICD-10-CM | POA: Diagnosis not present

## 2014-12-05 DIAGNOSIS — N762 Acute vulvitis: Secondary | ICD-10-CM | POA: Diagnosis not present

## 2014-12-06 DIAGNOSIS — Z1231 Encounter for screening mammogram for malignant neoplasm of breast: Secondary | ICD-10-CM | POA: Diagnosis not present

## 2014-12-07 DIAGNOSIS — M5126 Other intervertebral disc displacement, lumbar region: Secondary | ICD-10-CM | POA: Diagnosis not present

## 2014-12-07 DIAGNOSIS — M545 Low back pain: Secondary | ICD-10-CM | POA: Diagnosis not present

## 2014-12-10 MED ORDER — ATORVASTATIN 20 MG TAB
20 mg | ORAL_TABLET | ORAL | Status: DC
Start: 2014-12-10 — End: 2015-07-17

## 2014-12-10 MED ORDER — GLIPIZIDE 5 MG TAB
5 mg | ORAL_TABLET | ORAL | Status: DC
Start: 2014-12-10 — End: 2015-07-16

## 2014-12-12 DIAGNOSIS — H2640 Unspecified secondary cataract: Secondary | ICD-10-CM | POA: Diagnosis not present

## 2014-12-12 DIAGNOSIS — H04123 Dry eye syndrome of bilateral lacrimal glands: Secondary | ICD-10-CM | POA: Diagnosis not present

## 2014-12-14 DIAGNOSIS — M47897 Other spondylosis, lumbosacral region: Secondary | ICD-10-CM | POA: Diagnosis not present

## 2014-12-14 DIAGNOSIS — M5126 Other intervertebral disc displacement, lumbar region: Secondary | ICD-10-CM | POA: Diagnosis not present

## 2014-12-14 DIAGNOSIS — M5416 Radiculopathy, lumbar region: Secondary | ICD-10-CM | POA: Diagnosis not present

## 2014-12-19 DIAGNOSIS — N762 Acute vulvitis: Secondary | ICD-10-CM | POA: Diagnosis not present

## 2014-12-19 DIAGNOSIS — N898 Other specified noninflammatory disorders of vagina: Secondary | ICD-10-CM | POA: Diagnosis not present

## 2014-12-30 DIAGNOSIS — S92351A Displaced fracture of fifth metatarsal bone, right foot, initial encounter for closed fracture: Secondary | ICD-10-CM | POA: Diagnosis not present

## 2014-12-30 DIAGNOSIS — S92341A Displaced fracture of fourth metatarsal bone, right foot, initial encounter for closed fracture: Secondary | ICD-10-CM | POA: Diagnosis not present

## 2014-12-30 DIAGNOSIS — S92331A Displaced fracture of third metatarsal bone, right foot, initial encounter for closed fracture: Secondary | ICD-10-CM | POA: Diagnosis not present

## 2014-12-31 ENCOUNTER — Ambulatory Visit
Admit: 2014-12-31 | Discharge: 2014-12-31 | Payer: PRIVATE HEALTH INSURANCE | Attending: Family Medicine | Primary: Family Medicine

## 2014-12-31 DIAGNOSIS — R55 Syncope and collapse: Secondary | ICD-10-CM

## 2014-12-31 MED ORDER — ALBUTEROL SULFATE HFA 90 MCG/ACTUATION AEROSOL INHALER
90 mcg/actuation | Freq: Four times a day (QID) | RESPIRATORY_TRACT | 1 refills | Status: DC | PRN
Start: 2014-12-31 — End: 2015-07-16

## 2014-12-31 NOTE — Progress Notes (Signed)
Pt is here for a F/U from a fall last Thursday  .Pt denies injury.     Do you have an advance directive no  Request Pt bring a copy of advance directive for scanning.  Do you want information on an advance directive declined     1. Have you been to the ER, urgent care clinic since your last visit?  Hospitalized since your last visit?No    2. Have you seen or consulted any other health care providers outside of the [facility] since your last visit?  Include any pap smears or colon screening. No

## 2014-12-31 NOTE — Patient Instructions (Addendum)
Fainting: Care Instructions  Your Care Instructions     When you faint, or pass out, you lose consciousness for a short time. A brief drop in blood flow to the brain often causes it. When you fall or lie down, more blood flows to your brain and you regain consciousness.  Emotional stress, pain, or overheating???especially if you have been standing???can make you faint. In these cases, fainting is usually not serious. But fainting can be a sign of a more serious problem. Your doctor may want you to have more tests to rule out other causes.  The treatment you need depends on the reason why you fainted.  The doctor has checked you carefully, but problems can develop later. If you notice any problems or new symptoms, get medical treatment right away.  Follow-up care is a key part of your treatment and safety. Be sure to make and go to all appointments, and call your doctor if you are having problems. It's also a good idea to know your test results and keep a list of the medicines you take.  How can you care for yourself at home?  ?? Drink plenty of fluids to prevent dehydration. If you have kidney, heart, or liver disease and have to limit fluids, talk with your doctor before you increase your fluid intake.  When should you call for help?  Call 911 anytime you think you may need emergency care. For example, call if:  ?? You have symptoms of a heart problem. These may include:  ?? Chest pain or pressure.  ?? Severe trouble breathing.  ?? A fast or irregular heartbeat.  ?? Lightheadedness or sudden weakness.  ?? Coughing up pink, foamy mucus.  ?? Passing out.  After you call 911, the operator may tell you to chew 1 adult-strength or 2 to 4 low-dose aspirin. Wait for an ambulance. Do not try to drive yourself.  ?? You have symptoms of a stroke. These may include:  ?? Sudden numbness, tingling, weakness, or loss of movement in your face, arm, or leg, especially on only one side of your body.  ?? Sudden vision changes.   ?? Sudden trouble speaking.  ?? Sudden confusion or trouble understanding simple statements.  ?? Sudden problems with walking or balance.  ?? A sudden, severe headache that is different from past headaches.  ?? You passed out (lost consciousness) again.  Watch closely for changes in your health, and be sure to contact your doctor if:  ?? You do not get better as expected.  Where can you learn more?  Go to http://www.healthwise.net/GoodHelpConnections  Enter A848 in the search box to learn more about "Fainting: Care Instructions."  ?? 2006-2016 Healthwise, Incorporated. Care instructions adapted under license by [employer] (which disclaims liability or warranty for this information). This care instruction is for use with your licensed healthcare professional. If you have questions about a medical condition or this instruction, always ask your healthcare professional. Healthwise, Incorporated disclaims any warranty or liability for your use of this information.  Content Version: 10.9.538570; Current as of: April 06, 2014

## 2015-01-01 DIAGNOSIS — S92331A Displaced fracture of third metatarsal bone, right foot, initial encounter for closed fracture: Secondary | ICD-10-CM | POA: Diagnosis not present

## 2015-01-01 DIAGNOSIS — S92352A Displaced fracture of fifth metatarsal bone, left foot, initial encounter for closed fracture: Secondary | ICD-10-CM | POA: Diagnosis not present

## 2015-01-01 DIAGNOSIS — S92342A Displaced fracture of fourth metatarsal bone, left foot, initial encounter for closed fracture: Secondary | ICD-10-CM | POA: Diagnosis not present

## 2015-01-03 ENCOUNTER — Encounter: Primary: Family Medicine

## 2015-01-04 DIAGNOSIS — K219 Gastro-esophageal reflux disease without esophagitis: Secondary | ICD-10-CM | POA: Diagnosis not present

## 2015-01-04 DIAGNOSIS — S92342A Displaced fracture of fourth metatarsal bone, left foot, initial encounter for closed fracture: Secondary | ICD-10-CM | POA: Diagnosis not present

## 2015-01-04 DIAGNOSIS — S92341A Displaced fracture of fourth metatarsal bone, right foot, initial encounter for closed fracture: Secondary | ICD-10-CM | POA: Diagnosis not present

## 2015-01-04 DIAGNOSIS — Z79899 Other long term (current) drug therapy: Secondary | ICD-10-CM | POA: Diagnosis not present

## 2015-01-04 DIAGNOSIS — M545 Low back pain: Secondary | ICD-10-CM | POA: Diagnosis not present

## 2015-01-04 DIAGNOSIS — J3089 Other allergic rhinitis: Secondary | ICD-10-CM | POA: Diagnosis not present

## 2015-01-04 DIAGNOSIS — S92351A Displaced fracture of fifth metatarsal bone, right foot, initial encounter for closed fracture: Secondary | ICD-10-CM | POA: Diagnosis not present

## 2015-01-04 DIAGNOSIS — G8918 Other acute postprocedural pain: Secondary | ICD-10-CM | POA: Diagnosis not present

## 2015-01-04 DIAGNOSIS — S92352A Displaced fracture of fifth metatarsal bone, left foot, initial encounter for closed fracture: Secondary | ICD-10-CM | POA: Diagnosis not present

## 2015-01-04 DIAGNOSIS — S92331A Displaced fracture of third metatarsal bone, right foot, initial encounter for closed fracture: Secondary | ICD-10-CM | POA: Diagnosis not present

## 2015-01-04 DIAGNOSIS — E039 Hypothyroidism, unspecified: Secondary | ICD-10-CM | POA: Diagnosis not present

## 2015-01-04 DIAGNOSIS — G8929 Other chronic pain: Secondary | ICD-10-CM | POA: Diagnosis not present

## 2015-01-05 NOTE — Progress Notes (Signed)
HISTORY OF PRESENT ILLNESS  Cheryl Paul is a 64 y.o. female.  HPI  64 year old established female patient in today with an ongoing concern.    Patient reports an unwitnessed fall last week.  Patient says she was sitting in the kitchen doing chores as she always does due to her OA of B/L knees when she felt herself lean to the left before falling from the chair.  She felt as if she had no control of her body at the time.  Her friends were in another room and heard her fall and came to her aid, she does not think she lost consciousness.  She did not go to the ED that day.  She reports that she had not eaten all that day and had felt fatigue following the incident and slept for most of the day following.  She denies shakes, sweats, pain, neurological deficits, weakness, incontinence or tongue biting.  She denies CP, palpitations or SOB.    Patient with hx of DM, HLD and COPD    Allergies   Allergen Reactions   ??? Iodine Rash   ??? Lactose Diarrhea     Lactose intolerance       Past Medical History   Diagnosis Date   ??? Arthritis      knee, arms,    ??? Asthma    ??? Chronic obstructive pulmonary disease (HCC)    ??? Diabetes (HCC)    ??? H/O sinusitis    ??? Hypercholesterolemia    ??? Hypertension    ??? Toe fracture, left August 2015     4th toe       Family History   Problem Relation Age of Onset   ??? Hypertension Mother    ??? Stroke Mother    ??? Diabetes Mother    ??? Thyroid Disease Mother    ??? Arthritis-rheumatoid Mother    ??? Cancer Father      colon polpe       Social History   Substance Use Topics   ??? Smoking status: Never Smoker   ??? Smokeless tobacco: Never Used   ??? Alcohol use No        Current Outpatient Prescriptions   Medication Sig   ??? albuterol (VENTOLIN HFA) 90 mcg/actuation inhaler Take 2 Puffs by inhalation every six (6) hours as needed for Wheezing.   ??? atorvastatin (LIPITOR) 20 mg tablet TAKE ONE TABLET BY MOUTH ONE TIME DAILY   ??? glipiZIDE (GLUCOTROL) 5 mg tablet TAKE ONE TABLET BY MOUTH TWICE DAILY    ??? oxyCODONE-acetaminophen (PERCOCET) 5-325 mg per tablet Take 1 Tab by mouth every four (4) hours as needed for Pain. Max Daily Amount: 6 Tabs.   ??? metFORMIN (GLUCOPHAGE) 1,000 mg tablet Take 1 Tab by mouth two (2) times daily (with meals).   ??? lisinopril (PRINIVIL, ZESTRIL) 10 mg tablet Take 1 Tab by mouth daily.   ??? ADVOCATE REDI-CODE+ strip    ??? ADVOCATE REDI-CODE+ CTRL LOW soln    ??? ULTRA THIN LANCETS 31 gauge misc    ??? budesonide-formoterol (SYMBICORT) 80-4.5 mcg/actuation HFAA inhaler Take 2 Puffs by inhalation two (2) times a day.   ??? clotrimazole-betamethasone (LOTRISONE) 1-0.05 % lotion Apply  to affected area two (2) times a day.   ??? sitaGLIPtin (JANUVIA) 25 mg tablet Take 1 Tab by mouth daily.     No current facility-administered medications for this visit.         Past Surgical History   Procedure Laterality  Date   ??? Pr abdomen surgery proc unlisted       4 surguries for blockages   ??? Hx hernia repair       abdomen   ??? Hx cholecystectomy     ??? Hx gyn     ??? Hx hysterectomy       tubes tied   ??? Hx carpal tunnel release Bilateral    ??? Hx polypectomy  04/18/14     ROS  See HPI  Visit Vitals   ??? BP 132/74 (BP 1 Location: Left arm)   ??? Pulse 67   ??? Temp 97.5 ??F (36.4 ??C)   ??? Resp 18   ??? Ht 5\' 3"  (1.6 m)   ??? Wt 280 lb (127 kg)   ??? SpO2 97%   ??? BMI 49.6 kg/m2     Physical Exam  Constitutional: Obese habitus.  She is oriented to person, place, and time and in no distress.   Head: Normocephalic and atraumatic.   Right Ear: External ear normal.   Left Ear: External ear normal.   Eyes: Pupils are equal, round, and reactive to light.   Neck: Normal range of motion.   Cardiovascular: Normal rate, regular rhythm, normal heart sounds and intact distal pulses.    No murmur heard.  Pulmonary/Chest: Effort normal and breath sounds normal. No respiratory distress.   Musculoskeletal: +  Trace edema.  Normal range of motion.   Neurological: She is alert and oriented to person, place, and time. Gait normal.    Skin: Skin is warm and dry.   Psychiatric: Mood, memory, affect and judgment normal.     Lab Results   Component Value Date/Time    HEMOGLOBIN A1C 8.2 07/19/2014 09:45 AM     ASSESSMENT and PLAN    ICD-10-CM ICD-9-CM    1. Syncope, unspecified syncope type R55 780.2 EKG, 12 LEAD, INITIAL      CT HEAD WO CONT      [id number] - [facility] COLLECTION      CBC WITH AUTOMATED DIFF      METABOLIC PANEL, COMPREHENSIVE      TSH 3RD GENERATION      [id number] [facility] DRAW FEE      URINALYSIS W/MICROSCOPIC      HEMOGLOBIN A1C WITH EAG      LIPID PANEL      DUPLEX CAROTID BILATERAL   2. Fall from chair, initial encounter W07.Lorne Skeens B147.8      -Work up for syncope initiated today.  DDX extensive but include hypoglycemia, seizure d/o, vasovagal syncope, neurologic or cardiac in nature.  -No symptoms today and no emergent work up neccessary based on HPI and PE.  ED precautions reviewed.  Patient agrees with plan and was given the opportunity to ask questions.  -RTC 2 weeks follow up     -Patient classified as morbidly obese  -Advised continued exercise and dietary modifications.   -Patient agrees with assessment and plan    According to the CDC an increased BMI can lead to "all-causes of death (mortality), High blood pressure (Hypertension), High LDL cholesterol, low HDL cholesterol, or high levels of triglycerides (Dyslipidemia), Type 2 diabetes, Coronary heart disease, Stroke, Gallbladder disease, Osteoarthritis (a breakdown of cartilage and bone within a joint), Sleep apnea and breathing problems, Chronic inflammation and increased oxidative stress, some cancers (endometrial, breast, colon, kidney, gallbladder, and liver), Low quality of life, Mental illness such as clinical depression, anxiety, and other mental disorders and Body pain and difficulty with physical functioning."  BMI Weight Status   Below 18.5 Underweight   18.5 ??? 24.9 Normal or Healthy Weight   25.0 ??? 29.9 Overweight   30.0 and Above Obese    40.0 and Above Morbid Obesity     Additional Instructions:  The patient understands that they should contact the office at any time if any questions or concerns develop.  They are also aware that they can call our main office number at [phone] at any time if they would like to address any concerns with the physician.  They also understand that they should dial 911 if any acute emergency arises.  The patient understands that they should give us a minimum of 48 hours to complete prescription refills once they are requested.  The patient has also been instructed to contact us by calling the main office number if they have not received feedback within 2 weeks of having any tests completed.  The patient is a aware that they should read all package insert information when picking up the medications and that they should consult the pharmacist of a physician if they have any questions or concerns regarding the prescribed medications.  Discussed with the patient new medications given and patient instructed to read pharmacy literature regarding side effects and drug interactions.  Instructions for taking the medications were provided to the patient and the consequences of not taking it.      Follow-up Disposition:   Return if symptoms worsen or fail to improve.   Risk and benefits of new medication discussed in detail when indicated, patient was given the opportunity to ask questions   AVS provided  reviewed diet, exercise and weight control when indicated  Alarm signals discussed. ER precautions reviewed when indicated  Plan of care reviewed with patient. Understanding verbalized and they are in agreement with plan of care.     Baruch Goldmanneresa R Johnson, DO

## 2015-01-08 ENCOUNTER — Inpatient Hospital Stay: Admit: 2015-01-08 | Payer: BLUE CROSS/BLUE SHIELD | Attending: Family Medicine | Primary: Family Medicine

## 2015-01-08 DIAGNOSIS — R55 Syncope and collapse: Secondary | ICD-10-CM

## 2015-01-08 DIAGNOSIS — R9431 Abnormal electrocardiogram [ECG] [EKG]: Secondary | ICD-10-CM

## 2015-01-08 LAB — EKG, 12 LEAD, INITIAL
Atrial Rate: 72 {beats}/min
Calculated P Axis: 42 degrees
Calculated R Axis: 14 degrees
Calculated T Axis: 23 degrees
Diagnosis: NORMAL
P-R Interval: 138 ms
Q-T Interval: 378 ms
QRS Duration: 82 ms
QTC Calculation (Bezet): 413 ms
Ventricular Rate: 72 {beats}/min

## 2015-01-08 NOTE — Other (Signed)
Vascular study complete. Patient armband removed and shredded. Patient discharged.

## 2015-01-08 NOTE — Telephone Encounter (Signed)
LM for Pt that Dr Delford Field prefers to sit down & discuss your labs at the office visit & recommended scheduling a lab appt 1 week before her appt with Dr Delford Field.

## 2015-01-08 NOTE — Telephone Encounter (Signed)
Patient has a follow up appointment and wants to know if she should come in for her labs before her appointment. Appointment Sept 12, 2016.

## 2015-01-08 NOTE — Procedures (Signed)
[facility]  *** FINAL REPORT ***    Name: Cheryl Paul, Cheryl Paul  MRN: [id number]    Outpatient  DOB: 28 Oct 1950  HIS Order #: [id number]  TRAKnet Visit #: [id number]  Date: 08 Jan 2015    TYPE OF TEST: Cerebrovascular Duplex    REASON FOR TEST    Right Carotid:-             Proximal               Mid                 Distal  cm/s  Systolic  Diastolic  Systolic  Diastolic  Systolic  Diastolic  CCA:    103.1      19.1      111.5      21.8       95.2      17.1  Bulb:  ECA:     79.8      16.3  ICA:     38.8      13.5       55.1      19.6       46.2      23.6  ICA/CCA:  0.5       0.9    ICA Stenosis: Normal    Right Vertebral:-  Finding: Antegrade  Sys:       56.5  Dia:       25.4    Right Subclavian: Normal    Left Carotid:-            Proximal                Mid                 Distal  cm/s  Systolic  Diastolic  Systolic  Diastolic  Systolic  Diastolic  CCA:     87.7      21.2       85.4      26.2       92.9      26.2  Bulb:  ECA:     55.9      10.7  ICA:     63.4      13.0       84.1      27.1       50.9      18.8  ICA/CCA:  0.9       1.0    ICA Stenosis: Normal    Left Vertebral:-  Finding: Antegrade  Sys:       32.7  Dia:        7.8    Left Subclavian: Normal    INTERPRETATION/FINDINGS  Duplex images were obtained using 2-D gray scale, color flow, and  spectral Doppler analysis.  1. No evidence of significant arterial occlusive disease in the  internal carotid arteries.  2. No significant stenosis in the external carotid arteries  bilaterally.  3. Antegrade flow in both vertebral arteries.  4. Normal flow in both subclavian arteries.    ADDITIONAL COMMENTS  Limitations: Body habitus    I have personally reviewed the data relevant to the interpretation of  this  study.    TECHNOLOGIST: Jennifer McFarland, RVT  Signed: 01/08/2015 11:33 AM    PHYSICIAN: Kataleah Bejar, MD  Signed: 01/08/2015 03:41 PM

## 2015-01-08 NOTE — Procedures (Signed)
[facility]  *** FINAL REPORT ***    Name: Cheryl SongsterJONES, Cheryl  MRN: [id number]    Outpatient  DOB: 28 Oct 1950  HIS Order #: [id number]  TRAKnet Visit #: [id number]  Date: 08 Jan 2015    TYPE OF TEST: Cerebrovascular Duplex    REASON FOR TEST    Right Carotid:-             Proximal               Mid                 Distal  cm/s  Systolic  Diastolic  Systolic  Diastolic  Systolic  Diastolic  CCA:    103.1      19.1      111.5      21.8       95.2      17.1  Bulb:  ECA:     79.8      16.3  ICA:     38.8      13.5       55.1      19.6       46.2      23.6  ICA/CCA:  0.5       0.9    ICA Stenosis: Normal    Right Vertebral:-  Finding: Antegrade  Sys:       56.5  Dia:       25.4    Right Subclavian: Normal    Left Carotid:-            Proximal                Mid                 Distal  cm/s  Systolic  Diastolic  Systolic  Diastolic  Systolic  Diastolic  CCA:     78.487.7      21.2       85.4      26.2       92.9      26.2  Bulb:  ECA:     55.9      10.7  ICA:     63.4      13.0       84.1      27.1       50.9      18.8  ICA/CCA:  0.9       1.0    ICA Stenosis: Normal    Left Vertebral:-  Finding: Antegrade  Sys:       32.7  Dia:        7.8    Left Subclavian: Normal    INTERPRETATION/FINDINGS  Duplex images were obtained using 2-D gray scale, color flow, and  spectral Doppler analysis.  1. No evidence of significant arterial occlusive disease in the  internal carotid arteries.  2. No significant stenosis in the external carotid arteries  bilaterally.  3. Antegrade flow in both vertebral arteries.  4. Normal flow in both subclavian arteries.    ADDITIONAL COMMENTS  Limitations: Body habitus    I have personally reviewed the data relevant to the interpretation of  this  study.    TECHNOLOGIST: Wynell BalloonJennifer McFarland, RVT  Signed: 01/08/2015 11:33 AM    PHYSICIAN: Lenox AhrJayaraman Carlton Buskey, MD  Signed: 01/08/2015 03:41 PM

## 2015-01-09 DIAGNOSIS — S92342A Displaced fracture of fourth metatarsal bone, left foot, initial encounter for closed fracture: Secondary | ICD-10-CM | POA: Diagnosis not present

## 2015-01-12 MED ORDER — LISINOPRIL 10 MG TAB
10 mg | ORAL_TABLET | ORAL | 1 refills | Status: DC
Start: 2015-01-12 — End: 2015-07-16

## 2015-01-14 DIAGNOSIS — S92331A Displaced fracture of third metatarsal bone, right foot, initial encounter for closed fracture: Secondary | ICD-10-CM | POA: Diagnosis not present

## 2015-01-18 ENCOUNTER — Other Ambulatory Visit
Admit: 2015-01-18 | Discharge: 2015-01-18 | Payer: PRIVATE HEALTH INSURANCE | Attending: Family Medicine | Primary: Family Medicine

## 2015-01-18 ENCOUNTER — Inpatient Hospital Stay: Admit: 2015-01-18 | Payer: BLUE CROSS/BLUE SHIELD | Primary: Family Medicine

## 2015-01-18 DIAGNOSIS — M5416 Radiculopathy, lumbar region: Secondary | ICD-10-CM | POA: Diagnosis not present

## 2015-01-18 DIAGNOSIS — M5117 Intervertebral disc disorders with radiculopathy, lumbosacral region: Secondary | ICD-10-CM | POA: Diagnosis not present

## 2015-01-18 DIAGNOSIS — M5126 Other intervertebral disc displacement, lumbar region: Secondary | ICD-10-CM | POA: Diagnosis not present

## 2015-01-18 DIAGNOSIS — M5136 Other intervertebral disc degeneration, lumbar region: Secondary | ICD-10-CM | POA: Diagnosis not present

## 2015-01-18 DIAGNOSIS — R55 Syncope and collapse: Secondary | ICD-10-CM

## 2015-01-18 LAB — METABOLIC PANEL, COMPREHENSIVE
A-G Ratio: 1.4 (ref 0.8–1.7)
ALT (SGPT): 17 U/L (ref 13–56)
AST (SGOT): 11 U/L — ABNORMAL LOW (ref 15–37)
Albumin: 3.8 g/dL (ref 3.4–5.0)
Alk. phosphatase: 59 U/L (ref 45–117)
Anion gap: 10 mmol/L (ref 3.0–18)
BUN/Creatinine ratio: 15 (ref 12–20)
BUN: 11 MG/DL (ref 7.0–18)
Bilirubin, total: 0.4 MG/DL (ref 0.2–1.0)
CO2: 27 mmol/L (ref 21–32)
Calcium: 8.8 MG/DL (ref 8.5–10.1)
Chloride: 105 mmol/L (ref 100–108)
Creatinine: 0.72 MG/DL (ref 0.6–1.3)
GFR est AA: >60 mL/min/{1.73_m2} (ref >60)
GFR est non-AA: >60 mL/min/{1.73_m2} (ref >60)
Globulin: 2.8 g/dL (ref 2.0–4.0)
Glucose: 211 mg/dL — ABNORMAL HIGH (ref 74–99)
Potassium: 4 mmol/L (ref 3.5–5.5)
Protein, total: 6.6 g/dL (ref 6.4–8.2)
Sodium: 142 mmol/L (ref 136–145)

## 2015-01-18 LAB — CBC WITH AUTOMATED DIFF
ABS. BASOPHILS: 0 10*3/uL (ref 0.0–0.06)
ABS. EOSINOPHILS: 0.1 10*3/uL (ref 0.0–0.4)
ABS. LYMPHOCYTES: 2.1 10*3/uL (ref 0.9–3.6)
ABS. MONOCYTES: 0.3 10*3/uL (ref 0.05–1.2)
ABS. NEUTROPHILS: 4.6 10*3/uL (ref 1.8–8.0)
BASOPHILS: 0 % (ref 0–2)
EOSINOPHILS: 2 % (ref 0–5)
HCT: 41.8 % (ref 35.0–45.0)
HGB: 12.7 g/dL (ref 12.0–16.0)
LYMPHOCYTES: 29 % (ref 21–52)
MCH: 27.1 PG (ref 24.0–34.0)
MCHC: 30.4 g/dL — ABNORMAL LOW (ref 31.0–37.0)
MCV: 89.3 FL (ref 74.0–97.0)
MONOCYTES: 4 % (ref 3–10)
MPV: 13.1 FL — ABNORMAL HIGH (ref 9.2–11.8)
NEUTROPHILS: 65 % (ref 40–73)
PLATELET: 169 10*3/uL (ref 135–420)
RBC: 4.68 M/uL (ref 4.20–5.30)
RDW: 14.1 % (ref 11.6–14.5)
WBC: 7 10*3/uL (ref 4.6–13.2)

## 2015-01-18 LAB — LIPID PANEL
CHOL/HDL Ratio: 3 (ref 0–5.0)
Cholesterol, total: 154 MG/DL (ref <200)
HDL Cholesterol: 52 MG/DL (ref 40–60)
LDL, calculated: 73 MG/DL (ref 0–100)
Triglyceride: 145 MG/DL (ref <150)
VLDL, calculated: 29 MG/DL

## 2015-01-18 LAB — TSH 3RD GENERATION: TSH: 2.32 u[IU]/mL (ref 0.36–3.74)

## 2015-01-18 NOTE — Progress Notes (Signed)
Lab visit

## 2015-01-19 LAB — URINALYSIS W/MICROSCOPIC
Bilirubin: NEGATIVE
Blood: NEGATIVE
Glucose: NEGATIVE mg/dL
Ketone: NEGATIVE mg/dL
Leukocyte Esterase: NEGATIVE
Nitrites: NEGATIVE
Protein: NEGATIVE mg/dL
RBC: 0 /hpf (ref 0–5)
Specific gravity: 1.02 (ref 1.005–1.030)
Urobilinogen: 1 EU/dL (ref 0.2–1.0)
WBC: 4 /hpf (ref 0–4)
pH (UA): 5 (ref 5.0–8.0)

## 2015-01-19 LAB — HEMOGLOBIN A1C WITH EAG
Est. average glucose: 206 mg/dL
Hemoglobin A1c: 8.8 % — ABNORMAL HIGH (ref 4.2–5.6)

## 2015-01-23 DIAGNOSIS — S92331A Displaced fracture of third metatarsal bone, right foot, initial encounter for closed fracture: Secondary | ICD-10-CM | POA: Diagnosis not present

## 2015-01-28 ENCOUNTER — Ambulatory Visit
Admit: 2015-01-28 | Discharge: 2015-01-28 | Payer: PRIVATE HEALTH INSURANCE | Attending: Family Medicine | Primary: Family Medicine

## 2015-01-28 ENCOUNTER — Telehealth

## 2015-01-28 DIAGNOSIS — E119 Type 2 diabetes mellitus without complications: Secondary | ICD-10-CM

## 2015-01-28 MED ORDER — NYSTATIN 100,000 UNIT/G TOPICAL CREAM
100000 unit/gram | Freq: Two times a day (BID) | CUTANEOUS | 0 refills | Status: DC
Start: 2015-01-28 — End: 2015-07-16

## 2015-01-28 MED ORDER — EXENATIDE ER 2 MG SUBCUTANEOUS EXTENDED RELEASE SUSPENSION
2 mg | SUBCUTANEOUS | 2 refills | Status: DC
Start: 2015-01-28 — End: 2015-06-17

## 2015-01-28 MED ORDER — OXYCODONE-ACETAMINOPHEN 5 MG-325 MG TAB
5-325 mg | ORAL_TABLET | ORAL | 0 refills | Status: DC | PRN
Start: 2015-01-28 — End: 2015-04-13

## 2015-01-28 NOTE — Patient Instructions (Signed)
A Healthy Lifestyle: Care Instructions  Your Care Instructions  A healthy lifestyle can help you feel good, stay at a healthy weight, and have plenty of energy for both work and play. A healthy lifestyle is something you can share with your whole family.  A healthy lifestyle also can lower your risk for serious health problems, such as high blood pressure, heart disease, and diabetes.  You can follow a few steps listed below to improve your health and the health of your family.  Follow-up care is a key part of your treatment and safety. Be sure to make and go to all appointments, and call your doctor if you are having problems. It???s also a good idea to know your test results and keep a list of the medicines you take.  How can you care for yourself at home?  ?? Do not eat too much sugar, fat, or fast foods. You can still have dessert and treats now and then. The goal is moderation.  ?? Start small to improve your eating habits. Pay attention to portion sizes, drink less juice and soda pop, and eat more fruits and vegetables.  ?? Eat a healthy amount of food. A 3-ounce serving of meat, for example, is about the size of a deck of cards. Fill the rest of your plate with vegetables and whole grains.  ?? Limit the amount of soda and sports drinks you have every day. Drink more water when you are thirsty.  ?? Eat at least 5 servings of fruits and vegetables every day. It may seem like a lot, but it is not hard to reach this goal. A serving or helping is 1 piece of fruit, 1 cup of vegetables, or 2 cups of leafy, raw vegetables. Have an apple or some carrot sticks as an afternoon snack instead of a candy bar. Try to have fruits and/or vegetables at every meal.  ?? Make exercise part of your daily routine. You may want to start with simple activities, such as walking, bicycling, or slow swimming. Try to be active 30 to 60 minutes every day. You do not need to do all 30 to 60  minutes all at once. For example, you can exercise 3 times a day for 10 or 20 minutes. Moderate exercise is safe for most people, but it is always a good idea to talk to your doctor before starting an exercise program.  ?? Keep moving. Mow the lawn, work in the garden, or clean your house. Take the stairs instead of the elevator at work.  ?? If you smoke, quit. People who smoke have an increased risk for heart attack, stroke, cancer, and other lung illnesses. Quitting is hard, but there are ways to boost your chance of quitting tobacco for good.  ?? Use nicotine gum, patches, or lozenges.  ?? Ask your doctor about stop-smoking programs and medicines.  ?? Keep trying.  In addition to reducing your risk of diseases in the future, you will notice some benefits soon after you stop using tobacco. If you have shortness of breath or asthma symptoms, they will likely get better within a few weeks after you quit.  ?? Limit how much alcohol you drink. Moderate amounts of alcohol (up to 2 drinks a day for men, 1 drink a day for women) are okay. But drinking too much can lead to liver problems, high blood pressure, and other health problems.  Family health  If you have a family, there are many things you can do   together to improve your health.  ?? Eat meals together as a family as often as possible.  ?? Eat healthy foods. This includes fruits, vegetables, lean meats and dairy, and whole grains.  ?? Include your family in your fitness plan. Most people think of activities such as jogging or tennis as the way to fitness, but there are many ways you and your family can be more active. Anything that makes you breathe hard and gets your heart pumping is exercise. Here are some tips:  ?? Walk to do errands or to take your child to school or the bus.  ?? Go for a family bike ride after dinner instead of watching TV.  Where can you learn more?  Go to http://www.healthwise.net/GoodHelpConnections   Enter U807 in the search box to learn more about "A Healthy Lifestyle: Care Instructions."  ?? 2006-2016 Healthwise, Incorporated. Care instructions adapted under license by [employer] (which disclaims liability or warranty for this information). This care instruction is for use with your licensed healthcare professional. If you have questions about a medical condition or this instruction, always ask your healthcare professional. Healthwise, Incorporated disclaims any warranty or liability for your use of this information.  Content Version: 10.9.538570; Current as of: April 06, 2014

## 2015-01-28 NOTE — Progress Notes (Signed)
Pt is here for 2 week follow up syncope.     Do you have an advance directive no  Request Pt bring a copy of advance directive for scanning.  Do you want information on an advance directive declined       1. Have you been to the ER, urgent care clinic since your last visit?  Hospitalized since your last visit?No    2. Have you seen or consulted any other health care providers outside of the [facility] since your last visit?  Include any pap smears or colon screening. No

## 2015-01-28 NOTE — Telephone Encounter (Signed)
Patient called in and would like to have a script sent to her pharmacy for a malaria pill. Patient also wants to know what other options she has, can she get a shot for this and where. Please advise.

## 2015-01-29 MED ORDER — ATOVAQUONE-PROGUANIL 250 MG-100 MG TAB
250-100 mg | ORAL_TABLET | Freq: Every day | ORAL | 0 refills | Status: AC
Start: 2015-01-29 — End: 2015-02-14

## 2015-01-29 NOTE — Telephone Encounter (Signed)
Attempted to reach Pt however mailbox is full.

## 2015-01-29 NOTE — Telephone Encounter (Signed)
Pt returned call & is aware DR Delford Field send in medication to her pharmacy as requested.

## 2015-01-29 NOTE — Telephone Encounter (Signed)
Please advise patient that there are many medications used to prevent malaria, medications are recommended based on many variables.  Currently I recommend malarone, script has been sent to pharmacy.

## 2015-01-30 DIAGNOSIS — S92331A Displaced fracture of third metatarsal bone, right foot, initial encounter for closed fracture: Secondary | ICD-10-CM | POA: Diagnosis not present

## 2015-01-31 ENCOUNTER — Encounter

## 2015-01-31 DIAGNOSIS — Z0000 Encounter for general adult medical examination without abnormal findings: Secondary | ICD-10-CM | POA: Diagnosis not present

## 2015-01-31 DIAGNOSIS — Z139 Encounter for screening, unspecified: Secondary | ICD-10-CM | POA: Diagnosis not present

## 2015-01-31 DIAGNOSIS — Z1389 Encounter for screening for other disorder: Secondary | ICD-10-CM | POA: Diagnosis not present

## 2015-02-01 NOTE — Progress Notes (Signed)
HISTORY OF PRESENT ILLNESS  Cheryl Paul is a 64 y.o. female.  HPI  64 year old established female patient in today for 2 week follow up on syncope.    Patient reports that she has been doing well, she has had no more episodes of of passing out.  She has reported a recent fall while getting onto her bed which is on risers one evening.  She now has left hip aches and muscle cramps  CT head, Carotid dopplers, EKG and labs reviewed with patient today.    Patient reports continued erratic eating patterns, she is on a sulfonylureas.  After full review of tests and imaging leads me to suspect hypoglycemia as etiology of syncope.      Recap from previous visit:  Patient reports an unwitnessed fall last week.  Patient says she was sitting in the kitchen doing chores as she always does due to her OA of B/L knees when she felt herself lean to the left before falling from the chair.  She felt as if she had no control of her body at the time.  Her friends were in another room and heard her fall and came to her aid, she does not think she lost consciousness.  She did not go to the ED that day.  She reports that she had not eaten all that day and had felt fatigue following the incident and slept for most of the day following.  She denies shakes, sweats, pain, neurological deficits, weakness, incontinence or tongue biting.  She denies CP, palpitations or SOB.    Patient with hx of DM, HLD and COPD    Allergies   Allergen Reactions   ??? Iodine Rash   ??? Lactose Diarrhea     Lactose intolerance       Past Medical History   Diagnosis Date   ??? Arthritis      knee, arms,    ??? Asthma    ??? Chronic obstructive pulmonary disease (HCC)    ??? Diabetes (HCC)    ??? H/O sinusitis    ??? Hypercholesterolemia    ??? Hypertension    ??? Toe fracture, left August 2015     4th toe       Family History   Problem Relation Age of Onset   ??? Hypertension Mother    ??? Stroke Mother    ??? Diabetes Mother    ??? Thyroid Disease Mother     ??? Arthritis-rheumatoid Mother    ??? Cancer Father      colon polpe       Social History   Substance Use Topics   ??? Smoking status: Never Smoker   ??? Smokeless tobacco: Never Used   ??? Alcohol use No        Current Outpatient Prescriptions   Medication Sig   ??? oxyCODONE-acetaminophen (PERCOCET) 5-325 mg per tablet Take 1 Tab by mouth every four (4) hours as needed for Pain. Max Daily Amount: 6 Tabs.   ??? nystatin (MYCOSTATIN) topical cream Apply  to affected area two (2) times a day.   ??? exenatide microspheres (BYDUREON) 2 mg serr 2 mg by SubCUTAneous route every seven (7) days.   ??? lisinopril (PRINIVIL, ZESTRIL) 10 mg tablet TAKE 1 TABLET BY MOUTH DAILY   ??? albuterol (VENTOLIN HFA) 90 mcg/actuation inhaler Take 2 Puffs by inhalation every six (6) hours as needed for Wheezing.   ??? atorvastatin (LIPITOR) 20 mg tablet TAKE ONE TABLET BY MOUTH ONE TIME DAILY   ???  glipiZIDE (GLUCOTROL) 5 mg tablet TAKE ONE TABLET BY MOUTH TWICE DAILY   ??? metFORMIN (GLUCOPHAGE) 1,000 mg tablet Take 1 Tab by mouth two (2) times daily (with meals).   ??? ADVOCATE REDI-CODE+ strip    ??? ADVOCATE REDI-CODE+ CTRL LOW soln    ??? ULTRA THIN LANCETS 31 gauge misc    ??? budesonide-formoterol (SYMBICORT) 80-4.5 mcg/actuation HFAA inhaler Take 2 Puffs by inhalation two (2) times a day.   ??? atovaquone-proguanil (MALARONE) 250-100 mg per tablet Take 1 Tab by mouth daily for 16 days. Start 2 days prior to travel and continue for 1 week after return from [place].   ??? AFLURIA 2016-2017, PF, syrg injection Flu shot @@ [facility] 01/08/15   ??? sitaGLIPtin (JANUVIA) 25 mg tablet Take 1 Tab by mouth daily.     No current facility-administered medications for this visit.         Past Surgical History   Procedure Laterality Date   ??? Pr abdomen surgery proc unlisted       4 surguries for blockages   ??? Hx hernia repair       abdomen   ??? Hx cholecystectomy     ??? Hx gyn     ??? Hx hysterectomy       tubes tied   ??? Hx carpal tunnel release Bilateral    ??? Hx polypectomy  04/18/14      ROS  See HPI  Visit Vitals   ??? BP 132/80 (BP 1 Location: Right arm)   ??? Pulse 82   ??? Temp 97.4 ??F (36.3 ??C) (Oral)   ??? Resp 18   ??? Ht 5\' 3"  (1.6 m)   ??? Wt 280 lb 9.6 oz (127.3 kg)   ??? SpO2 99%   ??? BMI 49.71 kg/m2     Physical Exam  Constitutional: Obese habitus.  She is oriented to person, place, and time and in no distress.   Head: Normocephalic and atraumatic.   Right Ear: External ear normal.   Left Ear: External ear normal.   Eyes: Pupils are equal, round, and reactive to light.   Neck: Normal range of motion.   Cardiovascular: Normal rate, regular rhythm, normal heart sounds and intact distal pulses.    No murmur heard.  Pulmonary/Chest: Effort normal and breath sounds normal. No respiratory distress.   Musculoskeletal: +  Trace edema.  Normal range of motion.   Neurological: She is alert and oriented to person, place, and time. Gait normal.   Skin: Skin is warm and dry.   Psychiatric: Mood, memory, affect and judgment normal.     Lab Results   Component Value Date/Time    HEMOGLOBIN A1C 8.8 01/18/2015 09:01 AM     ASSESSMENT and PLAN    ICD-10-CM ICD-9-CM    1. Type 2 diabetes mellitus without complication (HCC) E11.9 250.00 exenatide microspheres (BYDUREON) 2 mg serr      CBC WITH AUTOMATED DIFF      METABOLIC PANEL, COMPREHENSIVE      [id number] [facility] DRAW FEE      [id number] - [facility] COLLECTION      MICROALBUMIN, UR, RAND W/ MICROALBUMIN/CREA RATIO      HEMOGLOBIN A1C WITH EAG   2. Arthritis, multiple joint involvement M12.9 716.99 AMB SUPPLY ORDER      oxyCODONE-acetaminophen (PERCOCET) 5-325 mg per tablet   3. Candidal intertrigo B37.2 112.3 nystatin (MYCOSTATIN) topical cream   4. Chronic obstructive pulmonary disease, unspecified COPD type (HCC) J44.9 496 CBC WITH AUTOMATED DIFF  METABOLIC PANEL, COMPREHENSIVE   5. Essential hypertension with goal blood pressure less than 130/80 I10 401.9 CBC WITH AUTOMATED DIFF      METABOLIC PANEL, COMPREHENSIVE      [id number] [facility] DRAW FEE       [id number] - [facility] COLLECTION      TSH 3RD GENERATION      MICROALBUMIN, UR, RAND W/ MICROALBUMIN/CREA RATIO   6. Pure hypercholesterolemia E78.0 272.0 [id number] [facility] DRAW FEE      [id number] - [facility] COLLECTION      LIPID PANEL   7. Vitamin D deficiency E55.9 268.9 [id number] [facility] DRAW FEE      [id number] - [facility] COLLECTION      VITAMIN D, 25 HYDROXY   8. Morbid obesity with BMI of 45.0-49.9, adult (HCC) Z68.42 V85.42 CBC WITH AUTOMATED DIFF      METABOLIC PANEL, COMPREHENSIVE      [id number] [facility] DRAW FEE      [id number] - [facility] COLLECTION      TSH 3RD GENERATION      MICROALBUMIN, UR, RAND W/ MICROALBUMIN/CREA RATIO      HEMOGLOBIN A1C WITH EAG     -Work up for syncope reviewed today.  DDX extensive but include hypoglycemia (which is my primary diagnosis), seizure d/o, vasovagal syncope, neurologic or cardiac in nature.  -Will d/c glipizide and januvia.  Initiate bydureon today  -No symptoms today and no further emergent work up neccessary based on HPI, PE and results review.  ED precautions reviewed.  Patient agrees with plan and was given the opportunity to ask questions.  -RTC 3 month  follow up w/ labs    -Patient classified as morbidly obese  -Advised continued exercise and dietary modifications.   -Patient agrees with assessment and plan    According to the CDC an increased BMI can lead to "all-causes of death (mortality), High blood pressure (Hypertension), High LDL cholesterol, low HDL cholesterol, or high levels of triglycerides (Dyslipidemia), Type 2 diabetes, Coronary heart disease, Stroke, Gallbladder disease, Osteoarthritis (a breakdown of cartilage and bone within a joint), Sleep apnea and breathing problems, Chronic inflammation and increased oxidative stress, some cancers (endometrial, breast, colon, kidney, gallbladder, and liver), Low quality of life, Mental illness such as clinical depression, anxiety, and other mental disorders and Body pain and difficulty with physical functioning."     BMI Weight Status   Below 18.5 Underweight   18.5 ??? 24.9 Normal or Healthy Weight   25.0 ??? 29.9 Overweight   30.0 and Above Obese   40.0 and Above Morbid Obesity     Additional Instructions:  The patient understands that they should contact the office at any time if any questions or concerns develop.  They are also aware that they can call our main office number at [phone] at any time if they would like to address any concerns with the physician.  They also understand that they should dial 911 if any acute emergency arises.  The patient understands that they should give [place] a minimum of 48 hours to complete prescription refills once they are requested.  The patient has also been instructed to contact us by calling the main office number if they have not received feedback within 2 weeks of having any tests completed.  The patient is a aware that they should read all package insert information when picking up the medications and that they should consult the pharmacist of a physician if they have any questions or concerns regarding the prescribed medications.  Discussed with  the patient new medications given and patient instructed to read pharmacy literature regarding side effects and drug interactions.  Instructions for taking the medications were provided to the patient and the consequences of not taking it.      Follow-up Disposition:   Return if symptoms worsen or fail to improve.   Risk and benefits of new medication discussed in detail when indicated, patient was given the opportunity to ask questions   AVS provided  reviewed diet, exercise and weight control when indicated  Alarm signals discussed. ER precautions reviewed when indicated  Plan of care reviewed with patient. Understanding verbalized and they are in agreement with plan of care.     Baruch Goldmann, DO

## 2015-02-04 DIAGNOSIS — N94819 Vulvodynia, unspecified: Secondary | ICD-10-CM | POA: Diagnosis not present

## 2015-02-04 DIAGNOSIS — R102 Pelvic and perineal pain: Secondary | ICD-10-CM | POA: Diagnosis not present

## 2015-02-14 NOTE — Telephone Encounter (Signed)
Per last OV note on 01/28/15 Pt is supposed to stop glipizide & januvia and start bydueron. LM for Pt with this information & to call back if she has any questions.

## 2015-02-14 NOTE — Telephone Encounter (Signed)
Patient called in and stated that she will be taking her Bydureon and is not sure which diabetic medications she should stop. Please advise.

## 2015-02-18 DIAGNOSIS — S92331A Displaced fracture of third metatarsal bone, right foot, initial encounter for closed fracture: Secondary | ICD-10-CM | POA: Diagnosis not present

## 2015-03-08 DIAGNOSIS — Z23 Encounter for immunization: Secondary | ICD-10-CM | POA: Diagnosis not present

## 2015-03-18 DIAGNOSIS — J029 Acute pharyngitis, unspecified: Secondary | ICD-10-CM | POA: Diagnosis not present

## 2015-03-18 DIAGNOSIS — H6691 Otitis media, unspecified, right ear: Secondary | ICD-10-CM | POA: Diagnosis not present

## 2015-03-18 DIAGNOSIS — Z112 Encounter for screening for other bacterial diseases: Secondary | ICD-10-CM | POA: Diagnosis not present

## 2015-03-18 DIAGNOSIS — Z23 Encounter for immunization: Secondary | ICD-10-CM | POA: Diagnosis not present

## 2015-04-13 ENCOUNTER — Emergency Department: Admit: 2015-04-13 | Payer: BLUE CROSS/BLUE SHIELD | Primary: Family Medicine

## 2015-04-13 ENCOUNTER — Inpatient Hospital Stay
Admit: 2015-04-13 | Discharge: 2015-04-13 | Disposition: A | Discharge status: Home / Self Care | Payer: BLUE CROSS/BLUE SHIELD | Attending: Emergency Medicine

## 2015-04-13 DIAGNOSIS — M79662 Pain in left lower leg: Secondary | ICD-10-CM

## 2015-04-13 LAB — D-DIMER, QUANTITATIVE: D-Dimer, Quant: 0.33 ug/ml(FEU) (ref <0.46)

## 2015-04-13 LAB — CBC WITH AUTOMATED DIFF
ABS. BASOPHILS: 0 10*3/uL (ref 0.0–0.06)
ABS. EOSINOPHILS: 0.1 10*3/uL (ref 0.0–0.4)
ABS. LYMPHOCYTES: 2 10*3/uL (ref 0.9–3.6)
ABS. MONOCYTES: 0.3 10*3/uL (ref 0.05–1.2)
ABS. NEUTROPHILS: 4.1 10*3/uL (ref 1.8–8.0)
BASOPHILS: 1 % (ref 0–2)
EOSINOPHILS: 2 % (ref 0–5)
HCT: 42.4 % (ref 35.0–45.0)
HGB: 13 g/dL (ref 12.0–16.0)
LYMPHOCYTES: 30 % (ref 21–52)
MCH: 26.4 PG (ref 24.0–34.0)
MCHC: 30.7 g/dL — ABNORMAL LOW (ref 31.0–37.0)
MCV: 86.2 FL (ref 74.0–97.0)
MONOCYTES: 5 % (ref 3–10)
MPV: 12.4 FL — ABNORMAL HIGH (ref 9.2–11.8)
NEUTROPHILS: 62 % (ref 40–73)
PLATELET: 177 10*3/uL (ref 135–420)
RBC: 4.92 M/uL (ref 4.20–5.30)
RDW: 13.6 % (ref 11.6–14.5)
WBC: 6.6 10*3/uL (ref 4.6–13.2)

## 2015-04-13 LAB — METABOLIC PANEL, BASIC
Anion gap: 8 mmol/L (ref 3.0–18)
BUN/Creatinine ratio: 14 (ref 12–20)
BUN: 10 MG/DL (ref 7.0–18)
CO2: 31 mmol/L (ref 21–32)
Calcium: 8.9 MG/DL (ref 8.5–10.1)
Chloride: 103 mmol/L (ref 100–108)
Creatinine: 0.73 MG/DL (ref 0.6–1.3)
GFR est AA: >60 mL/min/{1.73_m2} (ref >60)
GFR est non-AA: >60 mL/min/{1.73_m2} (ref >60)
Glucose: 278 mg/dL — ABNORMAL HIGH (ref 74–99)
Potassium: 4.2 mmol/L (ref 3.5–5.5)
Sodium: 142 mmol/L (ref 136–145)

## 2015-04-13 LAB — D DIMER: D DIMER: 0.33 ug/ml(FEU) (ref <0.46)

## 2015-04-13 MED ORDER — OXYCODONE-ACETAMINOPHEN 5 MG-325 MG TAB
5-325 mg | ORAL_TABLET | Freq: Four times a day (QID) | ORAL | 0 refills | Status: DC | PRN
Start: 2015-04-13 — End: 2015-06-17

## 2015-04-13 NOTE — ED Notes (Signed)
I have reviewed discharge instructions with the patient.  The patient verbalized understanding.  Current Discharge Medication List      CONTINUE these medications which have CHANGED    Details   oxyCODONE-acetaminophen (PERCOCET) 5-325 mg per tablet Take 1 Tab by mouth every six (6) hours as needed for Pain. Max Daily Amount: 4 Tabs.  Qty: 12 Tab, Refills: 0         CONTINUE these medications which have NOT CHANGED    Details   AFLURIA 2016-2017, PF, syrg injection Flu shot @@ [facility] 01/08/15      nystatin (MYCOSTATIN) topical cream Apply  to affected area two (2) times a day.  Qty: 15 g, Refills: 0    Associated Diagnoses: Candidal intertrigo      exenatide microspheres (BYDUREON) 2 mg serr 2 mg by SubCUTAneous route every seven (7) days.  Qty: 4 Each, Refills: 2    Associated Diagnoses: Type 2 diabetes mellitus without complication (HCC)      lisinopril (PRINIVIL, ZESTRIL) 10 mg tablet TAKE 1 TABLET BY MOUTH DAILY  Qty: 90 Tab, Refills: 1      albuterol (VENTOLIN HFA) 90 mcg/actuation inhaler Take 2 Puffs by inhalation every six (6) hours as needed for Wheezing.  Qty: 1 Inhaler, Refills: 1      atorvastatin (LIPITOR) 20 mg tablet TAKE ONE TABLET BY MOUTH ONE TIME DAILY  Qty: 90 Tab, Refills: 1      glipiZIDE (GLUCOTROL) 5 mg tablet TAKE ONE TABLET BY MOUTH TWICE DAILY  Qty: 180 Tab, Refills: 1      sitaGLIPtin (JANUVIA) 25 mg tablet Take 1 Tab by mouth daily.  Qty: 30 Tab, Refills: 3    Associated Diagnoses: Type 2 diabetes mellitus without complication (HCC)      metFORMIN (GLUCOPHAGE) 1,000 mg tablet Take 1 Tab by mouth two (2) times daily (with meals).  Qty: 60 Tab, Refills: 3    Associated Diagnoses: Type 2 diabetes mellitus without complication (HCC)      ADVOCATE REDI-CODE+ strip       ADVOCATE REDI-CODE+ CTRL LOW soln       ULTRA THIN LANCETS 31 gauge misc       budesonide-formoterol (SYMBICORT) 80-4.5 mcg/actuation HFAA inhaler Take 2 Puffs by inhalation two (2) times a day.  Qty: 1 Inhaler, Refills: 11          Patient armband removed and shredded

## 2015-04-13 NOTE — ED Provider Notes (Signed)
HPI Comments: 64 yr old female, hx DM, htn, asthma, COPD, and arthritis presents to the ED complaining of L leg pain and a "knot" to the outer upper thigh. Pt reports losing her balance and falling several weeks ago, which she states is when her pain started. Denies any  Hx of DVT. Denies leg swelling, redness, warmth, SOB, chest pain, fever, chills, abdominal sx, and urinary sx. Pt states the pain is worse when she lays down. Pt reports that she takes percocet for pain but is out of this medication. No other complaints.     Patient is a 64 y.o. female presenting with leg pain.   Leg Pain    Pertinent negatives include no numbness, no back pain and no neck pain.        Past Medical History:   Diagnosis Date   ??? Arthritis      knee, arms,    ??? Asthma    ??? Chronic obstructive pulmonary disease (HCC)    ??? Diabetes (HCC)    ??? H/O sinusitis    ??? Hypercholesterolemia    ??? Hypertension    ??? Toe fracture, left August 2015     4th toe       Past Surgical History:   Procedure Laterality Date   ??? Pr abdomen surgery proc unlisted       4 surguries for blockages   ??? Hx hernia repair       abdomen   ??? Hx cholecystectomy     ??? Hx gyn     ??? Hx hysterectomy       tubes tied   ??? Hx carpal tunnel release Bilateral    ??? Hx polypectomy  04/18/14         Family History:   Problem Relation Age of Onset   ??? Hypertension Mother    ??? Stroke Mother    ??? Diabetes Mother    ??? Thyroid Disease Mother    ??? Arthritis-rheumatoid Mother    ??? Cancer Father      colon polpe       Social History     Social History   ??? Marital status: SINGLE     Spouse name: N/A   ??? Number of children: N/A   ??? Years of education: N/A     Occupational History   ??? Not on file.     Social History Main Topics   ??? Smoking status: Never Smoker   ??? Smokeless tobacco: Never Used   ??? Alcohol use No   ??? Drug use: No   ??? Sexual activity: Not on file     Other Topics Concern   ??? 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 Denies history of smoking          ALLERGIES: Iodine and Lactose    Review of Systems   Constitutional: Negative for chills, diaphoresis, fatigue and fever.   HENT: Negative for congestion, ear pain, rhinorrhea and sore throat.    Eyes: Negative for pain and redness.   Respiratory: Negative for cough, shortness of breath, wheezing and stridor.    Cardiovascular: Negative for chest pain, palpitations and leg swelling.   Gastrointestinal: Negative for abdominal pain, constipation, diarrhea, nausea and vomiting.   Genitourinary: Negative for dysuria, flank pain, frequency and hematuria.   Musculoskeletal: Negative for back pain, myalgias, neck pain and neck stiffness.  Leg pain   Skin: Negative for rash and wound.   Neurological: Negative for dizziness, seizures, syncope, light-headedness, numbness and headaches.   All other systems reviewed and are negative.      Vitals:    04/13/15 1351   BP: 150/80   Pulse: 74   Resp: 16   Temp: 97.7 ??F (36.5 ??C)   SpO2: 99%   Weight: 127 kg (280 lb)   Height: 5\' 3"  (1.6 m)            Physical Exam   Constitutional: She is oriented to person, place, and time. She appears well-developed and well-nourished. No distress.   Morbidly obese   HENT:   Head: Normocephalic.   Neck: Normal range of motion. Neck supple.   Cardiovascular: Normal rate, regular rhythm and normal heart sounds.  Exam reveals no gallop and no friction rub.    No murmur heard.  Pulmonary/Chest: Effort normal and breath sounds normal. No stridor. No respiratory distress. She has no wheezes. She has no rales.   Musculoskeletal: Normal range of motion. She exhibits no edema or tenderness.   No edema or TTP noted to the R calf or femur. Negative SLR and Faber/patrick bilaterally. Pt is able to ambulate with the use of a cane, but is limping with ambulation.    Neurological: She is alert and oriented to person, place, and time. She  exhibits normal muscle tone. Coordination normal.   Pt is able to ambulate with the use of a cane, but is limping with ambulation.    Skin: Skin is warm and dry. No rash noted. She is not diaphoretic. No erythema.   Psychiatric: She has a normal mood and affect. Her behavior is normal. Thought content normal.   Nursing note and vitals reviewed.       MDM  Number of Diagnoses or Management Options  Arthralgia of left lower leg:   Diagnosis management comments: Impression: leg pain, hyperglycemia, htn     X-ray: degenerative changes, no acute process noted    MCHC 30.7, MPLV 12.4, glucose 278, D dimer unremarkable.     Patient is stable for discharge at this time. Rx for percocet given. Rest and follow-up with PCP/ortho this week. Return to the ED immediately for any new or worsening sx.  Jennie Hannay J Kenika Sahm, PA-C 3:00 PM            Amount and/or Complexity of Data Reviewed  Clinical lab tests: reviewed and ordered  Tests in the radiology section of CPT??: ordered and reviewed    Risk of Complications, Morbidity, and/or Mortality  Presenting problems: moderate  Diagnostic procedures: moderate  Management options: low    Patient Progress  Patient progress: stable    ED Course       Procedures

## 2015-04-13 NOTE — ED Triage Notes (Addendum)
Patient states losing her balance and experiencing a fall 2-3 weeks ago.  She states pain to left leg and c/o "knot" to outer upper thigh.  Patient states hx of arthritis.  Patient states that she is currently out of her percocet

## 2015-04-16 NOTE — Progress Notes (Signed)
[facility]    Nurse Navigator Note for ED Follow up    Patient was seen in [facility] ED on 04/13/15 for Left leg pain.    Medical History:     Past Medical History   Diagnosis Date   ??? Arthritis      knee, arms,    ??? Asthma    ??? Chronic obstructive pulmonary disease (HCC)    ??? Diabetes (HCC)    ??? H/O sinusitis    ??? Hypercholesterolemia    ??? Hypertension    ??? Toe fracture, left August 2015     4th toe       Patient presenting symptoms:Per Triage nurse, Patient states losing her balance and experiencing a fall 2-3 weeks ago. She states pain to left leg and c/o "knot" to outer upper thigh. Patient states hx of arthritis. Patient states that she is currently out of her percocet    Discharge Diagnoses:  Pain of left lower extremity    Hyperglycemia    Essential hypertension            Significant Lab/Diagnostic Findings:   Lab Results  Component Value Date/Time   WBC 6.6 04/13/2015 02:25 PM   HGB 13.0 04/13/2015 02:25 PM   HCT 42.4 04/13/2015 02:25 PM   PLATELET 177 04/13/2015 02:25 PM   MCV 86.2 04/13/2015 02:25 PM       Lab Results  Component Value Date/Time   GFR EST AA >60 04/13/2015 02:25 PM   GFR EST NON-AA >60 04/13/2015 02:25 PM   CREATININE 0.73 04/13/2015 02:25 PM   BUN 10 04/13/2015 02:25 PM   SODIUM 142 04/13/2015 02:25 PM   POTASSIUM 4.2 04/13/2015 02:25 PM   CHLORIDE 103 04/13/2015 02:25 PM   CO2 31 04/13/2015 02:25 PM          New medications at discharge include:  Percocet/Patient states she is not taking it because Percocet doesn't work for her.     Support System consists of: family    Future Plan for Patient/Discharge Instructions:  Patient had an office visit at Dr. Pricilla RiffleWardell's office on today. Patient denies any left leg, hip or thigh pain. Patient states she received injections in both hands. Patient doesn't want to see Dr. Delford FieldWright prior to her 04/29/15 appointment    Adherence to previous treatment and likelihood for f/u: yes    Called patient on telephone and verified with 2 identifiers.

## 2015-04-22 ENCOUNTER — Inpatient Hospital Stay: Admit: 2015-04-22 | Payer: BLUE CROSS/BLUE SHIELD | Primary: Family Medicine

## 2015-04-22 ENCOUNTER — Other Ambulatory Visit
Admit: 2015-04-22 | Discharge: 2015-04-22 | Payer: PRIVATE HEALTH INSURANCE | Attending: Family Medicine | Primary: Family Medicine

## 2015-04-22 DIAGNOSIS — E119 Type 2 diabetes mellitus without complications: Secondary | ICD-10-CM

## 2015-04-22 NOTE — Progress Notes (Signed)
Lab visit

## 2015-04-23 LAB — HEMOGLOBIN A1C WITH EAG
Est. average glucose: 220 mg/dL
Hemoglobin A1c: 9.3 % — ABNORMAL HIGH (ref 4.2–5.6)

## 2015-04-23 LAB — METABOLIC PANEL, COMPREHENSIVE
A-G Ratio: 1.3 (ref 0.8–1.7)
ALT (SGPT): 17 U/L (ref 13–56)
AST (SGOT): 11 U/L — ABNORMAL LOW (ref 15–37)
Albumin: 3.8 g/dL (ref 3.4–5.0)
Alk. phosphatase: 62 U/L (ref 45–117)
Anion gap: 8 mmol/L (ref 3.0–18)
BUN/Creatinine ratio: 18 (ref 12–20)
BUN: 12 MG/DL (ref 7.0–18)
Bilirubin, total: 0.5 MG/DL (ref 0.2–1.0)
CO2: 29 mmol/L (ref 21–32)
Calcium: 8.6 MG/DL (ref 8.5–10.1)
Chloride: 101 mmol/L (ref 100–108)
Creatinine: 0.67 MG/DL (ref 0.6–1.3)
GFR est AA: >60 mL/min/{1.73_m2} (ref >60)
GFR est non-AA: >60 mL/min/{1.73_m2} (ref >60)
Globulin: 3 g/dL (ref 2.0–4.0)
Glucose: 182 mg/dL — ABNORMAL HIGH (ref 74–99)
Potassium: 4.1 mmol/L (ref 3.5–5.5)
Protein, total: 6.8 g/dL (ref 6.4–8.2)
Sodium: 138 mmol/L (ref 136–145)

## 2015-04-23 LAB — CBC WITH AUTOMATED DIFF
ABS. BASOPHILS: 0 10*3/uL (ref 0.0–0.06)
ABS. EOSINOPHILS: 0.2 10*3/uL (ref 0.0–0.4)
ABS. LYMPHOCYTES: 2.9 10*3/uL (ref 0.9–3.6)
ABS. MONOCYTES: 0.5 10*3/uL (ref 0.05–1.2)
ABS. NEUTROPHILS: 4.5 10*3/uL (ref 1.8–8.0)
BASOPHILS: 1 % (ref 0–2)
EOSINOPHILS: 2 % (ref 0–5)
HCT: 42.9 % (ref 35.0–45.0)
HGB: 13.1 g/dL (ref 12.0–16.0)
LYMPHOCYTES: 36 % (ref 21–52)
MCH: 27.1 PG (ref 24.0–34.0)
MCHC: 30.5 g/dL — ABNORMAL LOW (ref 31.0–37.0)
MCV: 88.6 FL (ref 74.0–97.0)
MONOCYTES: 7 % (ref 3–10)
MPV: 12.8 FL — ABNORMAL HIGH (ref 9.2–11.8)
NEUTROPHILS: 54 % (ref 40–73)
PLATELET: 169 10*3/uL (ref 135–420)
RBC: 4.84 M/uL (ref 4.20–5.30)
RDW: 13.7 % (ref 11.6–14.5)
WBC: 8.2 10*3/uL (ref 4.6–13.2)

## 2015-04-23 LAB — LIPID PANEL
CHOL/HDL Ratio: 3.2 (ref 0–5.0)
Cholesterol, total: 156 MG/DL (ref <200)
HDL Cholesterol: 49 MG/DL (ref 40–60)
LDL, calculated: 72.6 MG/DL (ref 0–100)
Triglyceride: 172 MG/DL — ABNORMAL HIGH (ref <150)
VLDL, calculated: 34.4 MG/DL

## 2015-04-23 LAB — MICROALBUMIN, UR, RAND W/ MICROALB/CREAT RATIO
Creatinine, urine random: 178.54 mg/dL — ABNORMAL HIGH (ref 30–125)
Microalbumin,urine random: 1.3 MG/DL (ref 0–3.0)
Microalbumin/Creat ratio (mg/g creat): 7 mg/g (ref 0–30)

## 2015-04-23 LAB — TSH 3RD GENERATION: TSH: 3.96 u[IU]/mL — ABNORMAL HIGH (ref 0.36–3.74)

## 2015-04-23 LAB — VITAMIN D, 25 HYDROXY: Vitamin D 25-Hydroxy: 14.7 ng/mL — ABNORMAL LOW (ref 30–100)

## 2015-04-25 NOTE — Progress Notes (Signed)
Non emergent labs will discuss at follow up visit.

## 2015-04-29 ENCOUNTER — Encounter

## 2015-04-29 ENCOUNTER — Encounter: Attending: Family Medicine | Primary: Family Medicine

## 2015-06-06 NOTE — Telephone Encounter (Signed)
Patient called in and stated that she has bursitis and want to speak to Dr. Delford Field about this. Patient can be reached at [phone]

## 2015-06-06 NOTE — Telephone Encounter (Signed)
Message left on the VM for the patient to call back.

## 2015-06-07 NOTE — Telephone Encounter (Signed)
Pt returned nurse Avis call and would like for Nurse to give her a return call.

## 2015-06-10 NOTE — Telephone Encounter (Signed)
The patient has an appointment and will discuss with Dr.Wright at that time.

## 2015-06-17 ENCOUNTER — Ambulatory Visit
Admit: 2015-06-17 | Discharge: 2015-06-17 | Payer: PRIVATE HEALTH INSURANCE | Attending: Family Medicine | Primary: Family Medicine

## 2015-06-17 DIAGNOSIS — E119 Type 2 diabetes mellitus without complications: Secondary | ICD-10-CM

## 2015-06-17 MED ORDER — BLOOD SUGAR DIAGNOSTIC TEST STRIPS
ORAL_STRIP | 3 refills | Status: DC
Start: 2015-06-17 — End: 2018-02-23

## 2015-06-17 NOTE — Patient Instructions (Signed)
Hemoglobin A1c: About This Test  What is it?  Hemoglobin A1c is a blood test that checks your average blood sugar level over the past 2 to 3 months. This test also is called a glycohemoglobin test or an A1c test.  Why is this test done?  The A1c test is done to check how well your diabetes has been controlled over the past 2 to 3 months. Your doctor can use this information to adjust your medicine and diabetes treatment, if needed.  How can you prepare for the test?  You do not need to stop eating before you have an A1c test. This test can be done at any time during the day, even after a meal.  What happens during the test?  The health professional taking a sample of your blood will:  ?? Wrap an elastic band around your upper arm. This makes the veins below the band larger so it is easier to put a needle into the vein.  ?? Clean the needle site with alcohol.  ?? Put the needle into the vein.  ?? Attach a tube to the needle to fill it with blood.  ?? Remove the band from your arm when enough blood is collected.  ?? Put a gauze pad or cotton ball over the needle site as the needle is removed.  ?? Put pressure on the site and then put on a bandage.  What else should you know about the test?  The test result is usually given as a percentage. The normal A1c is less than 5.7%.  The A1c test result also can be used to find your estimated average glucose, or eAG. Your eAG and A1c show the same thing in two different ways. They both help you learn more about your average blood sugar range over the past 2 to 3 months.  Where can you learn more?  Go to http://www.healthwise.net/[employer].  Enter U216 in the search box to learn more about "Hemoglobin A1c: About This Test."  Current as of: Oct 08, 2014  Content Version: 11.1  ?? 2006-2016 [employer]. Care instructions adapted under license by [employer] (which disclaims liability or warranty  for this information). If you have questions about a medical condition or this instruction, always ask your healthcare professional. [employer]wise[employer] Inc[employer] disclaims any warranty or liability for your use of this information.

## 2015-06-17 NOTE — Progress Notes (Signed)
Pt is here for follow up HTN, Diabetes, Cholesterol, Vit D Def. Was to F/U in 3 months.  Labs drawn in Dec. 2016    Do you have an advance directive no  Request Pt bring a copy of advance directive for scanning.  Do you want information on an advance directive declined        1. Have you been to the ER, urgent care clinic since your last visit?  Hospitalized since your last visit?Yes [facility] ED 04/13/15 Leg pain.     2. Have you seen or consulted any other health care providers outside of the [facility] since your last visit?  Include any pap smears or colon screening. Yes Dr Delene Loll 12/16       Pt is due for foot exam today.

## 2015-06-17 NOTE — Progress Notes (Signed)
HISTORY OF PRESENT ILLNESS  Cheryl Paul is a 65 y.o. female.  HPI Comments: 65 year old established female in today for 4 mos follow up on HTN, COPD, DM, vitamin D deficiency, arthritis and HLD.    Patient is here today for her follow up, she has concerns about her blood sugars.  She does not have her log today.  She reports that she is doing well.  She is taking her medications with no adverse side effects, she is requesting refills today.  She denies CP, SOB, dyspnea, edema, N/T or myalgias.    She reports a sedentary lifestyle.    She has seen the nutritionist but has not started a diet plan as yet, she has gained 3 lbs.    She has sleep apnea but needs a new CPAP    She c/o trigger finger and recent diagnosis of bursitis in the hip.  She sees ortho    Allergies   Allergen Reactions   ??? Iodine Rash   ??? Lactose Diarrhea     Lactose intolerance       Past Medical History   Diagnosis Date   ??? Arthritis      knee, arms,    ??? Asthma    ??? Chronic obstructive pulmonary disease ([place])    ??? Diabetes ([place])    ??? H/O sinusitis    ??? Hypercholesterolemia    ??? Hypertension    ??? Toe fracture, left August 2015     4th toe       Family History   Problem Relation Age of Onset   ??? Hypertension Mother    ??? Stroke Mother    ??? Diabetes Mother    ??? Thyroid Disease Mother    ??? Arthritis-rheumatoid Mother    ??? Cancer Father      colon polpe       Social History   Substance Use Topics   ??? Smoking status: Never Smoker   ??? Smokeless tobacco: Never Used   ??? Alcohol use No        Current Outpatient Prescriptions   Medication Sig   ??? glucose blood VI test strips (ADVOCATE REDI-CODE+) strip Check blood sugar daily before breakfast   DX E11.9   ??? nystatin (MYCOSTATIN) topical cream Apply  to affected area two (2) times a day.   ??? lisinopril (PRINIVIL, ZESTRIL) 10 mg tablet TAKE 1 TABLET BY MOUTH DAILY   ??? atorvastatin (LIPITOR) 20 mg tablet TAKE ONE TABLET BY MOUTH ONE TIME DAILY    ??? glipiZIDE (GLUCOTROL) 5 mg tablet TAKE ONE TABLET BY MOUTH TWICE DAILY   ??? metFORMIN (GLUCOPHAGE) 1,000 mg tablet Take 1 Tab by mouth two (2) times daily (with meals).   ??? ADVOCATE REDI-CODE+ CTRL LOW soln    ??? ULTRA THIN LANCETS 31 gauge misc    ??? AFLURIA 2016-2017, PF, syrg injection Flu shot @@ [facility] 01/08/15   ??? albuterol (VENTOLIN HFA) 90 mcg/actuation inhaler Take 2 Puffs by inhalation every six (6) hours as needed for Wheezing.   ??? sitaGLIPtin (JANUVIA) 25 mg tablet Take 1 Tab by mouth daily.   ??? budesonide-formoterol (SYMBICORT) 80-4.5 mcg/actuation HFAA inhaler Take 2 Puffs by inhalation two (2) times a day.     No current facility-administered medications for this visit.         Past Surgical History   Procedure Laterality Date   ??? Pr abdomen surgery proc unlisted       4 surguries for blockages   ???  Hx hernia repair       abdomen   ??? Hx cholecystectomy     ??? Hx gyn     ??? Hx hysterectomy       tubes tied   ??? Hx carpal tunnel release Bilateral    ??? Hx polypectomy  04/18/14       ROS   Constitutional: Negative for fever and chills.   HENT: Negative for tinnitus.    Eyes: Negative for blurred vision and double vision.   Respiratory: Negative for cough and shortness of breath.    Cardiovascular: Negative for chest pain and leg swelling.   Gastrointestinal: Negative for nausea, vomiting and diarrhea.   Genitourinary: Negative for dysuria and flank pain.   Musculoskeletal: See HPI.  Negative for myalgias and joint pain.   Neurological: Negative for dizziness, tremors, sensory change, speech change, focal weakness and headaches.   Psychiatric/Behavioral: Negative for depression and suicidal ideas. The patient is not nervous/anxious.        Visit Vitals   ??? BP 137/82   ??? Pulse 76   ??? Temp 97.9 ??F (36.6 ??C) (Oral)   ??? Resp 18   ??? Ht 5' 3"  (1.6 m)   ??? Wt 286 lb (129.7 kg)   ??? SpO2 100%   ??? BMI 50.66 kg/m2     Physical Exam  Constitutional: Obese habitus.  She is oriented to person, place, and time  and in no distress.   Head: Normocephalic and atraumatic.   Right Ear: External ear normal.   Left Ear: External ear normal.   Eyes: Pupils are equal, round, and reactive to light.   Neck: Normal range of motion.   Cardiovascular: Normal rate, regular rhythm, normal heart sounds and intact distal pulses.    No murmur heard.  Pulmonary/Chest: Effort normal and breath sounds normal. No respiratory distress.   Musculoskeletal: +  Trace edema.  Normal range of motion.   Neurological: She is alert and oriented to person, place, and time. Gait normal.   Skin: Skin is warm and dry.   Psychiatric: Mood, memory, affect and judgment normal.          Diabetic foot exam performed by TRJ    Measurement  Response Physician Comment   Monofilament  R - normal sensation with micro filament  L - normal sensation with micro filament    Pulse DP R - present  L - present    Vibratory R - normal  L -normal    Pulse TP R - present  L - present    Structural deformity R - None  L - None    Skin Integrity / Deformity R - None  L - None       Reviewed by: Lemont Fillers, DO       Lab Results   Component Value Date/Time    WBC 8.2 04/22/2015 09:20 AM    HGB 13.1 04/22/2015 09:20 AM    HCT 42.9 04/22/2015 09:20 AM    PLATELET 169 04/22/2015 09:20 AM    MCV 88.6 04/22/2015 09:20 AM     Lab Results   Component Value Date/Time    Sodium 138 04/22/2015 09:20 AM    Potassium 4.1 04/22/2015 09:20 AM    Chloride 101 04/22/2015 09:20 AM    CO2 29 04/22/2015 09:20 AM    Anion gap 8 04/22/2015 09:20 AM    Glucose 182 04/22/2015 09:20 AM    BUN 12 04/22/2015 09:20 AM  Creatinine 0.67 04/22/2015 09:20 AM    BUN/Creatinine ratio 18 04/22/2015 09:20 AM    GFR est AA >60 04/22/2015 09:20 AM    GFR est non-AA >60 04/22/2015 09:20 AM    Calcium 8.6 04/22/2015 09:20 AM    Bilirubin, total 0.5 04/22/2015 09:20 AM    AST (SGOT) 11 04/22/2015 09:20 AM    Alk. phosphatase 62 04/22/2015 09:20 AM    Protein, total 6.8 04/22/2015 09:20 AM     Albumin 3.8 04/22/2015 09:20 AM    Globulin 3.0 04/22/2015 09:20 AM    A-G Ratio 1.3 04/22/2015 09:20 AM    ALT (SGPT) 17 04/22/2015 09:20 AM     Lab Results   Component Value Date/Time    Cholesterol, total 156 04/22/2015 09:20 AM    HDL Cholesterol 49 04/22/2015 09:20 AM    LDL, calculated 72.6 04/22/2015 09:20 AM    VLDL, calculated 34.4 04/22/2015 09:20 AM    Triglyceride 172 04/22/2015 09:20 AM    CHOL/HDL Ratio 3.2 04/22/2015 09:20 AM     Lab Results   Component Value Date/Time    Hemoglobin A1c 9.3 04/22/2015 09:20 AM     Lab Results   Component Value Date/Time    TSH 3.96 04/22/2015 09:20 AM       ASSESSMENT and PLAN    ICD-10-CM ICD-9-CM    1. Type 2 diabetes mellitus without complication, without long-term current use of insulin (HCC) E11.9 250.00 CBC WITH AUTOMATED DIFF      METABOLIC PANEL, COMPREHENSIVE      MICROALBUMIN, UR, RAND W/ MICROALBUMIN/CREA RATIO      HEMOGLOBIN A1C WITH EAG      URINALYSIS W/MICROSCOPIC   2. Chronic obstructive pulmonary disease, unspecified COPD type (HCC) J44.9 496 CBC WITH AUTOMATED DIFF      METABOLIC PANEL, COMPREHENSIVE   3. [place] intertrigo B37.2 112.3    4. Essential hypertension I10 401.9 CBC WITH AUTOMATED DIFF      METABOLIC PANEL, COMPREHENSIVE      MICROALBUMIN, UR, RAND W/ MICROALBUMIN/CREA RATIO      URINALYSIS W/MICROSCOPIC   5. Pure hypercholesterolemia E78.00 272.0 LIPID PANEL   6. Vitamin D deficiency E55.9 268.9 ergocalciferol (ERGOCALCIFEROL) 50,000 unit capsule      VITAMIN D, 25 HYDROXY   7. Abnormal thyroid blood test R94.6 794.5 TSH 3RD GENERATION      T4, FREE      TSH 3RD GENERATION   8. Morbid obesity with BMI of 40.0-44.9, adult ([place]) E66.01 278.01     Z68.41 V85.41      -Advised continued compliance with medication regimen and therapeutic treatment plan.  -Repeat thyroid labs   -Add high dose vit D today  -labs reviewed in detail with patient  -Patient will likely need the addition of a third agent for better DM  control.  Awaiting A1C for further assessment.  -RTC 3 months follow up w/ labs    Additional Instructions:  The patient understands that they should contact the office at any time if any questions or concerns develop.  They are also aware that they can call our main office number at [phone] at any time if they would like to address any concerns with the physician.  They also understand that they should dial 911 if any acute emergency arises.  The patient understands that they should give [place] a minimum of 48 hours to complete prescription refills once they are requested.  The patient has also been instructed to contact us by calling  the main office number if they have not received feedback within 2 weeks of having any tests completed.  The patient is a aware that they should read all package insert information when picking up the medications and that they should consult the pharmacist of a physician if they have any questions or concerns regarding the prescribed medications.  Discussed with the patient new medications given and patient instructed to read pharmacy literature regarding side effects and drug interactions.  Instructions for taking the medications were provided to the patient and the consequences of not taking it.      Follow-up Disposition:   Return if symptoms worsen or fail to improve.   Risk and benefits of new medication discussed in detail, patient was given the opportunity to ask questions  AVS provided  reviewed diet, exercise and weight control   Alarm signals discussed. ER precautions   Plan of care reviewed with patient. Understanding verbalized and they are in agreement with plan of care.     Lemont Fillers, DO    -

## 2015-06-20 NOTE — Telephone Encounter (Signed)
Please advise patient that vit D sent to the pharmacy for her.  Also please ask her to come in to repeat thyroid labs as she had a mild abnormality.

## 2015-06-20 NOTE — Addendum Note (Signed)
Addended by: Langston Masker on: 06/20/2015 07:40 PM      Modules accepted: Orders

## 2015-06-21 MED ORDER — ERGOCALCIFEROL (VITAMIN D2) 50,000 UNIT CAP
1250 mcg (50,000 unit) | ORAL_CAPSULE | ORAL | 2 refills | Status: AC
Start: 2015-06-21 — End: 2015-08-09

## 2015-06-21 NOTE — Telephone Encounter (Signed)
I called and spoke with the pt and informed her that per the provider "vit D sent to the pharmacy for her. Also please ask her to come in to repeat thyroid labs as she had a mild abnormality."  Pt asked "i did a thyroid test?" and i explained to her that she completed a tsh (thyroid lab test) in December."  Pt stated "okay" and verbalized understanding.

## 2015-06-24 ENCOUNTER — Ambulatory Visit
Admit: 2015-06-24 | Discharge: 2015-06-24 | Payer: PRIVATE HEALTH INSURANCE | Attending: Family Medicine | Primary: Family Medicine

## 2015-06-24 ENCOUNTER — Inpatient Hospital Stay: Admit: 2015-06-24 | Payer: BLUE CROSS/BLUE SHIELD | Primary: Family Medicine

## 2015-06-24 DIAGNOSIS — R946 Abnormal results of thyroid function studies: Secondary | ICD-10-CM

## 2015-06-24 DIAGNOSIS — E119 Type 2 diabetes mellitus without complications: Secondary | ICD-10-CM

## 2015-06-24 NOTE — Progress Notes (Signed)
Pt is here for 1 week to discuss diabetes   Pt has not taken BP meds this morning.     Do you have an advance directive no  Request Pt bring a copy of advance directive for scanning.  Do you want information on an advance directive declined       1. Have you been to the ER, urgent care clinic since your last visit?  Hospitalized since your last visit?No    2. Have you seen or consulted any other health care providers outside of the [facility] since your last visit?  Include any pap smears or colon screening. No      Pt given Pneumoccal vaccine 23 in R deltoid per verbral read back order Dr Shelle Iron.  Pt tolerated procedure with out reaction.

## 2015-06-24 NOTE — Progress Notes (Signed)
HISTORY OF PRESENT ILLNESS  Cheryl Paul is a 65 y.o. female.  HPI Comments: 65 year old established female in today for 1 week follow up on DM.    Patient is here today for her follow up, she has her log with her, AM BG average 220 and PM BG average 170.    She reports that she is doing well.  She is taking her medications with no adverse side effects, she is requesting refills today.  She denies CP, SOB, dyspnea, edema, N/T or myalgias.    She reports a sedentary lifestyle.      She has seen the nutritionist and has implemented her diet plan as yet, she has had some weight gain.    She has sleep apnea but needs a new CPAP    She has not taken her BP medications today    Allergies   Allergen Reactions   ??? Iodine Rash   ??? Lactose Diarrhea     Lactose intolerance       Past Medical History   Diagnosis Date   ??? Arthritis      knee, arms,    ??? Asthma    ??? Chronic obstructive pulmonary disease ([place])    ??? Diabetes ([place])    ??? H/O sinusitis    ??? Hypercholesterolemia    ??? Hypertension    ??? Toe fracture, left August 2015     4th toe       Family History   Problem Relation Age of Onset   ??? Hypertension Mother    ??? Stroke Mother    ??? Diabetes Mother    ??? Thyroid Disease Mother    ??? Arthritis-rheumatoid Mother    ??? Cancer Father      colon polpe       Social History   Substance Use Topics   ??? Smoking status: Never Smoker   ??? Smokeless tobacco: Never Used   ??? Alcohol use No        Current Outpatient Prescriptions   Medication Sig   ??? ergocalciferol (ERGOCALCIFEROL) 50,000 unit capsule Take 1 Cap by mouth every seven (7) days for 8 doses.   ??? glucose blood VI test strips (ADVOCATE REDI-CODE+) strip Check blood sugar daily before breakfast   DX E11.9   ??? lisinopril (PRINIVIL, ZESTRIL) 10 mg tablet TAKE 1 TABLET BY MOUTH DAILY   ??? atorvastatin (LIPITOR) 20 mg tablet TAKE ONE TABLET BY MOUTH ONE TIME DAILY   ??? glipiZIDE (GLUCOTROL) 5 mg tablet TAKE ONE TABLET BY MOUTH TWICE DAILY    ??? metFORMIN (GLUCOPHAGE) 1,000 mg tablet Take 1 Tab by mouth two (2) times daily (with meals).   ??? ADVOCATE REDI-CODE+ CTRL LOW soln    ??? ULTRA THIN LANCETS 31 gauge misc    ??? HYDROcodone-acetaminophen (NORCO) 5-325 mg per tablet 40 tabs     06/17/15   ??? AFLURIA 2016-2017, PF, syrg injection Flu shot @@ [employer] 01/08/15   ??? nystatin (MYCOSTATIN) topical cream Apply  to affected area two (2) times a day.   ??? albuterol (VENTOLIN HFA) 90 mcg/actuation inhaler Take 2 Puffs by inhalation every six (6) hours as needed for Wheezing.   ??? sitaGLIPtin (JANUVIA) 25 mg tablet Take 1 Tab by mouth daily.   ??? budesonide-formoterol (SYMBICORT) 80-4.5 mcg/actuation HFAA inhaler Take 2 Puffs by inhalation two (2) times a day.     No current facility-administered medications for this visit.         Past Surgical History  Procedure Laterality Date   ??? Pr abdomen surgery proc unlisted       4 surguries for blockages   ??? Hx hernia repair       abdomen   ??? Hx cholecystectomy     ??? Hx gyn     ??? Hx hysterectomy       tubes tied   ??? Hx carpal tunnel release Bilateral    ??? Hx polypectomy  04/18/14       ROS   Constitutional: Negative for fever and chills.   Respiratory: Negative for cough and shortness of breath.    Cardiovascular: Negative for chest pain and leg swelling.   Musculoskeletal: Negative for myalgias and joint pain.   Neurological: Negative for dizziness, tremors, sensory change, speech change, focal weakness and headaches.   Psychiatric/Behavioral: Negative for depression and suicidal ideas. The patient is not nervous/anxious.        Visit Vitals   ??? BP 136/71 (BP 1 Location: Left arm)   ??? Pulse 84   ??? Temp 98.6 ??F (37 ??C) (Oral)   ??? Resp 16   ??? Ht 5' 3"  (1.6 m)   ??? Wt 284 lb (128.8 kg)   ??? SpO2 99%   ??? BMI 50.31 kg/m2     Physical Exam  Constitutional: Obese habitus.  She is oriented to person, place, and time and in no distress.   Head: Normocephalic and atraumatic.    Cardiovascular: Normal rate, regular rhythm, normal heart sounds and intact distal pulses.    No murmur heard.  Pulmonary/Chest: Effort normal and breath sounds normal. No respiratory distress.   Musculoskeletal: +  Trace edema.  Normal range of motion.   Neurological: She is alert and oriented to person, place, and time. Gait normal.   Skin: Skin is warm and dry.   Psychiatric: Mood, memory, affect and judgment normal.       Lab Results   Component Value Date/Time    WBC 8.2 04/22/2015 09:20 AM    HGB 13.1 04/22/2015 09:20 AM    HCT 42.9 04/22/2015 09:20 AM    PLATELET 169 04/22/2015 09:20 AM    MCV 88.6 04/22/2015 09:20 AM     Lab Results   Component Value Date/Time    Sodium 138 04/22/2015 09:20 AM    Potassium 4.1 04/22/2015 09:20 AM    Chloride 101 04/22/2015 09:20 AM    CO2 29 04/22/2015 09:20 AM    Anion gap 8 04/22/2015 09:20 AM    Glucose 182 04/22/2015 09:20 AM    BUN 12 04/22/2015 09:20 AM    Creatinine 0.67 04/22/2015 09:20 AM    BUN/Creatinine ratio 18 04/22/2015 09:20 AM    GFR est AA >60 04/22/2015 09:20 AM    GFR est non-AA >60 04/22/2015 09:20 AM    Calcium 8.6 04/22/2015 09:20 AM    Bilirubin, total 0.5 04/22/2015 09:20 AM    AST (SGOT) 11 04/22/2015 09:20 AM    Alk. phosphatase 62 04/22/2015 09:20 AM    Protein, total 6.8 04/22/2015 09:20 AM    Albumin 3.8 04/22/2015 09:20 AM    Globulin 3.0 04/22/2015 09:20 AM    A-G Ratio 1.3 04/22/2015 09:20 AM    ALT (SGPT) 17 04/22/2015 09:20 AM     Lab Results   Component Value Date/Time    Cholesterol, total 156 04/22/2015 09:20 AM    HDL Cholesterol 49 04/22/2015 09:20 AM    LDL, calculated 72.6 04/22/2015 09:20 AM    VLDL, calculated  34.4 04/22/2015 09:20 AM    Triglyceride 172 04/22/2015 09:20 AM    CHOL/HDL Ratio 3.2 04/22/2015 09:20 AM     Lab Results   Component Value Date/Time    Hemoglobin A1c 9.3 04/22/2015 09:20 AM     Lab Results   Component Value Date/Time    TSH 2.85 06/24/2015 09:32 AM    T4, Free 1.2 06/24/2015 09:32 AM        ASSESSMENT and PLAN    ICD-10-CM ICD-9-CM    1. Type 2 diabetes mellitus without complication, without long-term current use of insulin (HCC) E11.9 250.00 REFERRAL TO OPHTHALMOLOGY   2. Essential hypertension I10 401.9    3. Encounter for immunization Z23 V03.89 PNEUMOCOCCAL POLYSACCHARIDE VACCINE, 23-VALENT, ADULT OR IMMUNOSUPPRESSED PT DOSE,      PR IMMUNIZ ADMIN,1 SINGLE/COMB VAC/TOXOID     -Advised continued compliance with medication regimen and therapeutic treatment plan.  -Patient will likely need the addition of another agent for better DM control.    -RTC 3 months follow up w/ labs    Additional Instructions:  The patient understands that they should contact the office at any time if any questions or concerns develop.  They are also aware that they can call our main office number at [phone] at any time if they would like to address any concerns with the physician.  They also understand that they should dial 911 if any acute emergency arises.  The patient understands that they should give [place] a minimum of 48 hours to complete prescription refills once they are requested.  The patient has also been instructed to contact us by calling the main office number if they have not received feedback within 2 weeks of having any tests completed.  The patient is a aware that they should read all package insert information when picking up the medications and that they should consult the pharmacist of a physician if they have any questions or concerns regarding the prescribed medications.  Discussed with the patient new medications given and patient instructed to read pharmacy literature regarding side effects and drug interactions.  Instructions for taking the medications were provided to the patient and the consequences of not taking it.      Follow-up Disposition:   Return if symptoms worsen or fail to improve.   Risk and benefits of new medication discussed in detail, patient was given  the opportunity to ask questions  AVS provided  reviewed diet, exercise and weight control   Alarm signals discussed. ER precautions   Plan of care reviewed with patient. Understanding verbalized and they are in agreement with plan of care.     Lemont Fillers, DO

## 2015-06-24 NOTE — Patient Instructions (Addendum)
Nutrition Tips for Diabetes: After Your Visit  Your Care Instructions  A healthy diet is important to manage diabetes. It helps you lose weight (if you need to) and keep it off. It gives you the nutrition and energy your body needs and helps prevent heart disease. But a diet for diabetes does not mean that you have to eat special foods. You can eat what your family eats, including occasional sweets and other favorites. But you do have to pay attention to how often you eat and how much you eat of certain foods. The right plan for you will give you meals that help you keep your blood sugar at healthy levels.  Try to eat a variety of foods and to spread carbohydrate throughout the day. Carbohydrate raises blood sugar higher and more quickly than any other nutrient does. Carbohydrate is found in sugar, breads and cereals, fruit, starchy vegetables such as potatoes and corn, and milk and yogurt.  You may want to work with a dietitian or diabetes educator to help you plan meals and snacks. A dietitian or diabetes educator also can help you lose weight if that is one of your goals. The following tips can help you enjoy your meals and stay healthy.  Follow-up care is a key part of your treatment and safety. Be sure to make and go to all appointments, and call your doctor if you are having problems. It???s also a good idea to know your test results and keep a list of the medicines you take.  How can you care for yourself at home?  ?? Learn which foods have carbohydrate and how much carbohydrate to eat. A dietitian or diabetes educator can help you learn to keep track of how much carbohydrate you eat.  ?? Spread carbohydrate throughout the day. Eat some carbohydrate at all meals, but do not eat too much at any one time.  ?? Plan meals to include food from all the food groups. These are the food groups and some example portion sizes:  ?? Grains: 1 slice of bread (1 ounce), ?? cup of cooked cereal, and 1/3 cup  of cooked pasta or rice. These have about 15 grams of carbohydrate in a serving. Choose whole grains such as whole wheat bread or crackers, oatmeal, and brown rice more often than refined grains.  ?? Fruit: 1 small fresh fruit, such as an apple or orange; ?? of a banana; ?? cup of chopped, cooked, or canned fruit; ?? cup of fruit juice; 1 cup of melon or raspberries; and 2 tablespoons of dried fruit. These have about 15 grams of carbohydrate in a serving.  ?? Dairy: 1 cup of nonfat or low-fat milk and 2/3 cup of plain yogurt. These have about 15 grams of carbohydrate in a serving.  ?? Protein foods: Beef, chicken, turkey, fish, eggs, tofu, cheese, cottage cheese, and peanut butter. A serving size of meat is 3 ounces, which is about the size of a deck of cards. Examples of meat substitute serving sizes (equal to 1 ounce of meat) are 1/4 cup of cottage cheese, 1 egg, 1 tablespoon of peanut butter, and ?? cup of tofu. These have very little or no carbohydrate per serving.  ?? Vegetables: Starchy vegetables such as ?? cup of cooked dried beans, peas, potatoes, or corn have about 15 grams of carbohydrate. Nonstarchy vegetables have very little carbohydrate, such as 1 cup of raw leafy vegetables (such as spinach), ?? cup of other vegetables (cooked or chopped), and 3/4 cup   of vegetable juice.  ?? Use the plate format to plan meals. It is a good, quick way to make sure that you have a balanced meal. It also helps you spread carbohydrate throughout the day. You divide your plate by types of foods. Put vegetables on half the plate, meat or meat substitutes on one-quarter of the plate, and a grain or starchy vegetable (such as brown rice or a potato) in the final quarter of the plate. To this you can add a small piece of fruit and 1 cup of milk or yogurt, depending on how much carbohydrate you are supposed to eat at a meal.  ?? Talk to your dietitian or diabetes educator about ways to add limited  amounts of sweets into your meal plan. You can eat these foods now and then, as long as you include the amount of carbohydrate they have in your daily carbohydrate allowance.  ?? If you drink alcohol, limit it to no more than 1 drink a day for women and 2 drinks a day for men. If you are pregnant, no amount of alcohol is known to be safe.  ?? Protein, fat, and fiber do not raise blood sugar as much as carbohydrate does. If you eat a lot of these nutrients in a meal, your blood sugar will rise more slowly than it would otherwise.  ?? Limit saturated fats, such as those from meat and dairy products. Try to replace it with monounsaturated fat, such as olive oil. This is a healthier choice because people who have diabetes are at higher-than-average risk of heart disease. But use a modest amount of olive oil. A tablespoon of olive oil has 14 grams of fat and 120 calories.  ?? Exercise lowers blood sugar. If you take insulin by shots or pump, you can use less than you would if you were not exercising. Keep in mind that timing matters. If you exercise within 1 hour after a meal, your body may need less insulin for that meal than it would if you exercised 3 hours after the meal. Test your blood sugar to find out how exercise affects your need for insulin.  ?? Exercise on most days of the week. Aim for at least 30 minutes. Exercise helps you stay at a healthy weight and helps your body use insulin. Walking is an easy way to get exercise. Gradually increase the amount you walk every day. You also may want to swim, bike, or do other activities.  When you eat out  ?? Learn to estimate the serving sizes of foods that have carbohydrate. If you measure food at home, it will be easier to estimate the amount in a serving of restaurant food.  ?? If the meal you order has too much carbohydrate (such as potatoes, corn, or baked beans), ask to have a low-carbohydrate food instead. Ask for a salad or green vegetables.   ?? If you use insulin, check your blood sugar before and after eating out to help you plan how much to eat in the future.  ?? If you eat more carbohydrate at a meal than you had planned, take a walk or do other exercise. This will help lower your blood sugar.   Where can you learn more?   Go to http://www.healthwise.net/[facility]  Enter I183 in the search box to learn more about "Nutrition Tips for Diabetes: After Your Visit."   ?? 2006-2014 Healthwise[employer]. Care instructions adapted under license by [facility] (which disclaims liability or warranty for this   information). This care instruction is for use with your licensed healthcare professional. If you have questions about a medical condition or this instruction, always ask your healthcare professional. Healthwise, Incorporated disclaims any warranty or liability for your use of this information.  Content Version: 10.2.346038; Current as of: October 19, 2012              Vaccine Information Statement    Pneumococcal Polysaccharide Vaccine: What You Need to Know    Many Vaccine Information Statements are available in Spanish and other languages. See www.immunize.org/vis.  Hojas de informaci??n Sobre Vacunas est??n disponibles en espa??ol y en muchos otros idiomas. Visite http://www.immunize.org/vis.    1. Why get vaccinated?    Vaccination can protect older adults (and some children and younger adults) from pneumococcal disease.    Pneumococcal disease is caused by bacteria that can spread from person to person through close contact.  It can cause ear infections, and it can also lead to more serious infections of the:  ??? Lungs (pneumonia),  ??? Blood (bacteremia), and  ??? Covering of the brain and spinal cord (meningitis). Meningitis can cause deafness and brain damage, and it can be fatal.      Anyone can get pneumococcal disease, but children under 2 years of age, people with certain medical conditions, adults over 65 years of age, and  cigarette smokers are at the highest risk.    About 18,000 older adults die each year from pneumococcal disease in the [place].    Treatment of pneumococcal infections with penicillin and other drugs used to be more effective. But some strains of the disease have become resistant to these drugs. This makes prevention of the disease, through vaccination, even more important.    2. Pneumococcal polysaccharide vaccine (PPSV23)    Pneumococcal polysaccharide vaccine (PPSV23) protects against 23 types of pneumococcal bacteria. It will not prevent all pneumococcal disease.    PPSV23 is recommended for:  ??? All adults 65 years of age and older,  ??? Anyone 2 through 64 years of age with certain long-term health problems,  ??? Anyone 2 through 64 years of age with a weakened immune system,  ??? Adults 19 through 64 years of age who smoke cigarettes or have asthma.     Most people need only one dose of PPSV.  A second dose is recommended for certain high-risk groups.  People 65 and older should get a dose even if they have gotten one or more doses of the vaccine before they turned 65.    Your healthcare provider can give you more information about these recommendations.    Most healthy adults develop protection within 2 to 3 weeks of getting the shot.     3. Some people should not get this vaccine    ??? Anyone who has had a life-threatening allergic reaction to PPSV should not get another dose.    ??? Anyone who has a severe allergy to any component of PPSV should not receive it. Tell your provider if you have any severe allergies.    ??? Anyone who is moderately or severely ill when the shot is scheduled may be asked to wait until they recover before getting the vaccine. Someone with a mild illness can usually be vaccinated.    ??? Children less than 2 years of age should not receive this vaccine.    ??? There is no evidence that PPSV is harmful to either a pregnant woman or  to her fetus. However, as a   precaution, women who need the vaccine should be vaccinated before becoming pregnant, if possible.    4. Risks of a vaccine reaction    With any medicine, including vaccines, there is a chance of side effects. These are usually mild and go away on their own, but serious reactions are also possible.     About half of people who get PPSV have mild side effects, such as redness or pain where the shot is given, which go away within about two days.    Less than 1 out of 100 people develop a fever, muscle aches, or more severe local reactions.    Problems that could happen after any vaccine:    ??? People sometimes faint after a medical procedure, including vaccination. Sitting or lying down for about 15 minutes can help prevent fainting, and injuries caused by a fall. Tell your doctor if you feel dizzy, or have vision changes or ringing in the ears.    ??? Some people get severe pain in the shoulder and have difficulty moving the arm where a shot was given. This happens very rarely.    ??? Any medication can cause a severe allergic reaction. Such reactions from a vaccine are very rare, estimated at about 1 in a million doses, and would happen within a few minutes to a few hours after the vaccination.     As with any medicine, there is a very remote chance of a vaccine causing a serious injury or death.    The safety of vaccines is always being monitored.  For more information, visit: www.cdc.gov/vaccinesafety/     5. What if there is a serious reaction?    What should I look for?    Look for anything that concerns you, such as signs of a severe allergic reaction, very high fever, or unusual behavior.    Signs of a severe allergic reaction can include hives, swelling of the face and throat, difficulty breathing, a fast heartbeat, dizziness, and weakness. These would usually start a few minutes to a few hours after the vaccination.    What should I do?     If you think it is a severe allergic reaction or other emergency that can???t wait, call 9-1-1 or get to the nearest hospital. Otherwise, call your doctor.    Afterward, the reaction should be reported to the Vaccine Adverse Event Reporting System (VAERS). Your doctor might file this report, or you can do it yourself through the VAERS web site at www.vaers.hhs.gov, or by calling 1-800-822-7967.     VAERS does not give medical advice.    6. How can I learn more?    ??? Ask your doctor. He or she can give you the vaccine package insert or suggest other sources of information.  ??? Call your local or state health department.  ??? Contact the Centers for Disease Control and Prevention (CDC):  - Call 1-800-232-4636 (1-800-CDC-INFO) or  - Visit CDC???s website at www.cdc.gov/vaccines    Vaccine Information Statement   PPSV   09/08/2013    Department of Health and Human Services  Centers for Disease Control and Prevention    Office Use Only

## 2015-06-25 LAB — TSH 3RD GENERATION: TSH: 2.85 u[IU]/mL (ref 0.36–3.74)

## 2015-06-25 LAB — T4, FREE: T4, Free: 1.2 NG/DL (ref 0.7–1.5)

## 2015-06-29 ENCOUNTER — Inpatient Hospital Stay
Admit: 2015-06-29 | Discharge: 2015-06-29 | Disposition: A | Discharge status: Home / Self Care | Payer: BLUE CROSS/BLUE SHIELD | Attending: Emergency Medicine

## 2015-06-29 DIAGNOSIS — J069 Acute upper respiratory infection, unspecified: Secondary | ICD-10-CM

## 2015-06-29 MED ORDER — BENZONATATE 100 MG CAP
100 mg | ORAL_CAPSULE | Freq: Three times a day (TID) | ORAL | 0 refills | Status: AC | PRN
Start: 2015-06-29 — End: 2015-07-06

## 2015-06-29 MED ORDER — FLUTICASONE 50 MCG/ACTUATION NASAL SPRAY, SUSP
50 mcg/actuation | Freq: Every day | NASAL | 0 refills | Status: DC
Start: 2015-06-29 — End: 2017-12-17

## 2015-06-29 NOTE — ED Provider Notes (Addendum)
HPI Comments: Cheryl Paul is a 65 y.o. Female with history of DM, HTN, arthritis, chronic pain that presents to the ED with a complaint of cough and post nasal drip x3 days.  Patient states that she went to here PCP on Monday and was feeling OK.  Denies SOB, CP, HA, NVD, abdominal pain.       Past Medical History:   Diagnosis Date   ??? Arthritis      knee, arms,    ??? Asthma    ??? Chronic obstructive pulmonary disease (HCC)    ??? Diabetes (HCC)    ??? H/O sinusitis    ??? Hypercholesterolemia    ??? Hypertension    ??? Toe fracture, left August 2015     4th toe       Past Surgical History:   Procedure Laterality Date   ??? Pr abdomen surgery proc unlisted       4 surguries for blockages   ??? Hx hernia repair       abdomen   ??? Hx cholecystectomy     ??? Hx gyn     ??? Hx hysterectomy       tubes tied   ??? Hx carpal tunnel release Bilateral    ??? Hx polypectomy  04/18/14         Family History:   Problem Relation Age of Onset   ??? Hypertension Mother    ??? Stroke Mother    ??? Diabetes Mother    ??? Thyroid Disease Mother    ??? Arthritis-rheumatoid Mother    ??? Cancer Father      colon polpe       Social History     Social History   ??? Marital status: SINGLE     Spouse name: N/A   ??? Number of children: N/A   ??? Years of education: N/A     Occupational History   ??? Not on file.     Social History Main Topics   ??? Smoking status: Never Smoker   ??? Smokeless tobacco: Never Used   ??? Alcohol use No   ??? Drug use: No   ??? Sexual activity: Not on file     Other Topics Concern   ??? 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 Denies history of smoking          ALLERGIES: Iodine and Lactose    Review of Systems   Respiratory: Positive for cough.    All other systems reviewed and are negative.      There were no vitals filed for this visit.         Physical Exam   Constitutional: She is oriented to person, place, and time. She appears well-developed.   Patient appears obese   HENT:    Head: Normocephalic and atraumatic.   Right Ear: External ear normal.   Left Ear: External ear normal.   Nose: Nose normal.   Mouth/Throat: Oropharynx is clear and moist. No oropharyngeal exudate.   Eyes: Conjunctivae are normal. Pupils are equal, round, and reactive to light.   Neck: Normal range of motion. Neck supple.   Cardiovascular: Normal rate, regular rhythm and normal heart sounds.    Pulmonary/Chest: Effort normal and breath sounds normal. No respiratory distress.   Musculoskeletal: Normal range of motion.   Neurological: She is alert and oriented to person, place, and time.   Skin: Skin is  warm and dry.   Psychiatric: She has a normal mood and affect. Her behavior is normal.   Nursing note and vitals reviewed.       MDM  Number of Diagnoses or Management Options  Diagnosis management comments: Impression:  URI, cough    Plan: discharge home  Increase fluids  Flonase for nasal sx  Tessalon Perles as prescribed  Follow up with PCP    Risk of Complications, Morbidity, and/or Mortality  Presenting problems: low  Diagnostic procedures: low  Management options: low    Patient Progress  Patient progress: stable    ED Course       Procedures             Vitals:  Patient Vitals for the past 12 hrs:   Temp Pulse Resp BP SpO2   06/29/15 1307 98.5 ??F (36.9 ??C) 78 20 145/69 100 %         Medications ordered:   Medications - No data to display      Lab findings:  No results found for this or any previous visit (from the past 12 hour(s)).        X-Ray, CT or other radiology findings or impressions:  No orders to display       Progress notes, Consult notes or additional Procedure notes:       Disposition:  Diagnosis: No diagnosis found.    Disposition: discharge     Follow-up Information     None           Patient's Medications   Start Taking    No medications on file   Continue Taking    ADVOCATE REDI-CODE+ CTRL LOW SOLN        AFLURIA 2016-2017, PF, SYRG INJECTION    Flu shot @@ [facility] 01/08/15     ALBUTEROL (VENTOLIN HFA) 90 MCG/ACTUATION INHALER    Take 2 Puffs by inhalation every six (6) hours as needed for Wheezing.    ATORVASTATIN (LIPITOR) 20 MG TABLET    TAKE ONE TABLET BY MOUTH ONE TIME DAILY    BUDESONIDE-FORMOTEROL (SYMBICORT) 80-4.5 MCG/ACTUATION HFAA INHALER    Take 2 Puffs by inhalation two (2) times a day.    ERGOCALCIFEROL (ERGOCALCIFEROL) 50,000 UNIT CAPSULE    Take 1 Cap by mouth every seven (7) days for 8 doses.    GLIPIZIDE (GLUCOTROL) 5 MG TABLET    TAKE ONE TABLET BY MOUTH TWICE DAILY    GLUCOSE BLOOD VI TEST STRIPS (ADVOCATE REDI-CODE+) STRIP    Check blood sugar daily before breakfast   DX E11.9    HYDROCODONE-ACETAMINOPHEN (NORCO) 5-325 MG PER TABLET    40 tabs     06/17/15    LISINOPRIL (PRINIVIL, ZESTRIL) 10 MG TABLET    TAKE 1 TABLET BY MOUTH DAILY    METFORMIN (GLUCOPHAGE) 1,000 MG TABLET    Take 1 Tab by mouth two (2) times daily (with meals).    NYSTATIN (MYCOSTATIN) TOPICAL CREAM    Apply  to affected area two (2) times a day.    SITAGLIPTIN (JANUVIA) 25 MG TABLET    Take 1 Tab by mouth daily.    ULTRA THIN LANCETS 31 GAUGE MISC       These Medications have changed    No medications on file   Stop Taking    No medications on file

## 2015-06-29 NOTE — ED Triage Notes (Signed)
Pt presents to ER c/o post nasal drip, runny nose, cough productive of clear sputum x 2 days.

## 2015-06-29 NOTE — ED Notes (Signed)
I have reviewed discharge instructions with the patient.  The patient verbalized understanding.  Current Discharge Medication List      START taking these medications    Details   fluticasone (FLONASE) 50 mcg/actuation nasal spray 2 Sprays by Both Nostrils route daily.  Qty: 1 Bottle, Refills: 0      benzonatate (TESSALON PERLES) 100 mg capsule Take 1 Cap by mouth three (3) times daily as needed for Cough for up to 7 days.  Qty: 30 Cap, Refills: 0         CONTINUE these medications which have NOT CHANGED    Details   HYDROcodone-acetaminophen (NORCO) 5-325 mg per tablet 40 tabs     06/17/15      ergocalciferol (ERGOCALCIFEROL) 50,000 unit capsule Take 1 Cap by mouth every seven (7) days for 8 doses.  Qty: 8 Cap, Refills: 2    Associated Diagnoses: Vitamin D deficiency      nystatin (MYCOSTATIN) topical cream Apply  to affected area two (2) times a day.  Qty: 15 g, Refills: 0    Associated Diagnoses: Candidal intertrigo      lisinopril (PRINIVIL, ZESTRIL) 10 mg tablet TAKE 1 TABLET BY MOUTH DAILY  Qty: 90 Tab, Refills: 1      albuterol (VENTOLIN HFA) 90 mcg/actuation inhaler Take 2 Puffs by inhalation every six (6) hours as needed for Wheezing.  Qty: 1 Inhaler, Refills: 1      atorvastatin (LIPITOR) 20 mg tablet TAKE ONE TABLET BY MOUTH ONE TIME DAILY  Qty: 90 Tab, Refills: 1      glipiZIDE (GLUCOTROL) 5 mg tablet TAKE ONE TABLET BY MOUTH TWICE DAILY  Qty: 180 Tab, Refills: 1      metFORMIN (GLUCOPHAGE) 1,000 mg tablet Take 1 Tab by mouth two (2) times daily (with meals).  Qty: 60 Tab, Refills: 3    Associated Diagnoses: Type 2 diabetes mellitus without complication (HCC)      glucose blood VI test strips (ADVOCATE REDI-CODE+) strip Check blood sugar daily before breakfast   DX E11.9  Qty: 50 Strip, Refills: 3      AFLURIA 2016-2017, PF, syrg injection Flu shot @@ [facility] 01/08/15      sitaGLIPtin (JANUVIA) 25 mg tablet Take 1 Tab by mouth daily.  Qty: 30 Tab, Refills: 3     Associated Diagnoses: Type 2 diabetes mellitus without complication (HCC)      ADVOCATE REDI-CODE+ CTRL LOW soln       ULTRA THIN LANCETS 31 gauge misc       budesonide-formoterol (SYMBICORT) 80-4.5 mcg/actuation HFAA inhaler Take 2 Puffs by inhalation two (2) times a day.  Qty: 1 Inhaler, Refills: 11         Patient armband removed and shredded

## 2015-07-05 NOTE — Telephone Encounter (Signed)
Pt calling with the following 2 items    1. Request return call re recent lab results    2. Wanted to let Dr Delford Field know she was seen at [facility] recently, treated for upper respiratory infection

## 2015-07-05 NOTE — Telephone Encounter (Signed)
Called patient and DOB verified.  Advised that repeat thyroid labs are unremarkable.    She updated me on ED visit also.

## 2015-07-16 ENCOUNTER — Encounter

## 2015-07-16 NOTE — Telephone Encounter (Signed)
Pt has new pharmacy. [facility]

## 2015-07-17 MED ORDER — NYSTATIN 100,000 UNIT/G TOPICAL CREAM
100000 unit/gram | Freq: Two times a day (BID) | CUTANEOUS | 1 refills | Status: DC
Start: 2015-07-17 — End: 2020-07-02

## 2015-07-17 MED ORDER — ALCOHOL SWABS
MEDICATED_PAD | CUTANEOUS | 1 refills | Status: DC
Start: 2015-07-17 — End: 2020-10-23

## 2015-07-17 MED ORDER — LANCETS 33 GAUGE
33 gauge | 3 refills | Status: DC
Start: 2015-07-17 — End: 2018-02-23

## 2015-07-17 MED ORDER — METFORMIN 1,000 MG TAB
1000 mg | ORAL_TABLET | Freq: Two times a day (BID) | ORAL | 1 refills | Status: DC
Start: 2015-07-17 — End: 2018-02-23

## 2015-07-17 MED ORDER — GLIPIZIDE 5 MG TAB
5 mg | ORAL_TABLET | ORAL | 1 refills | Status: DC
Start: 2015-07-17 — End: 2018-02-23

## 2015-07-17 MED ORDER — LISINOPRIL 10 MG TAB
10 mg | ORAL_TABLET | ORAL | 1 refills | Status: DC
Start: 2015-07-17 — End: 2018-02-21

## 2015-07-17 MED ORDER — LANCING DEVICE WITH LANCETS KIT
PACK | 0 refills | Status: DC
Start: 2015-07-17 — End: 2018-02-23

## 2015-07-17 MED ORDER — ALBUTEROL SULFATE HFA 90 MCG/ACTUATION AEROSOL INHALER
90 mcg/actuation | Freq: Four times a day (QID) | RESPIRATORY_TRACT | 0 refills | Status: DC | PRN
Start: 2015-07-17 — End: 2017-05-24

## 2015-07-17 MED ORDER — BUDESONIDE-FORMOTEROL HFA 80 MCG-4.5 MCG/ACTUATION AEROSOL INHALER
Freq: Two times a day (BID) | RESPIRATORY_TRACT | 1 refills | Status: DC
Start: 2015-07-17 — End: 2017-12-17

## 2015-07-18 MED ORDER — ATORVASTATIN 20 MG TAB
20 mg | ORAL_TABLET | ORAL | 2 refills | Status: DC
Start: 2015-07-18 — End: 2018-05-22

## 2015-07-18 NOTE — Telephone Encounter (Signed)
Please notify patient refill has been approved and sent to pharmacy.

## 2015-07-24 NOTE — Telephone Encounter (Signed)
Returned call to patient. Left message for patient to call the office.

## 2015-07-24 NOTE — Telephone Encounter (Signed)
Patient called in and stated that she would like for the nurse to give her a call. Patient wants to talk about diabetic eye exam. Please advise.

## 2015-09-18 ENCOUNTER — Encounter: Attending: Family Medicine | Primary: Family Medicine

## 2015-09-26 ENCOUNTER — Inpatient Hospital Stay
Admit: 2015-09-26 | Discharge: 2015-09-26 | Disposition: A | Discharge status: Home / Self Care | Payer: BLUE CROSS/BLUE SHIELD | Attending: Emergency Medicine

## 2015-09-26 DIAGNOSIS — M25561 Pain in right knee: Secondary | ICD-10-CM

## 2015-09-26 MED ORDER — TRAMADOL 50 MG TAB
50 mg | ORAL_TABLET | Freq: Three times a day (TID) | ORAL | 0 refills | Status: DC | PRN
Start: 2015-09-26 — End: 2015-10-29

## 2015-09-26 NOTE — Progress Notes (Signed)
Below documentation reviewed and agree that patient should be seen, she has no showed otho and declined appointment with me.  Emergent ED precautions were reviewed per nurse note.

## 2015-09-26 NOTE — Other (Signed)
Vascular study complete. Report to follow.

## 2015-09-26 NOTE — ED Notes (Signed)
Cheryl Paul is a 65 y.o. female that was discharged in stable. Pt was accompanied by friend. Pt is not driving. The patients diagnosis, condition and treatment were explained to  patient and aftercare instructions were given.  The patient verbalized understanding. Patient armband removed and shredded.

## 2015-09-26 NOTE — Progress Notes (Signed)
Complained of trouble sleeping, stated that she took tramadol (prescribed by orthopedics). Appointment scheduled for sometime this week to see ortho but did not go due to limited funds.   Rt leg pain, burning, rated at a 9 and swelling, located in the thigh area. Feels better when she sleeps. Denied radiating pain. Tramadol does not help. Bilateral swelling in legs. Noticed a week ago. Denied, sob, chest pain, difficulty breathing. Offered to schedule her to see PCP today which she declined. Advised that she go to the ED to be evaluated and she verbalized understanding.

## 2015-09-26 NOTE — Procedures (Signed)
[facility]  *** FINAL REPORT ***    Name: Cheryl Paul, Cheryl Paul  MRN: [id number]  DOB: 28 Oct 1950  HIS Order #: [id number]  [facility] Visit #: [id number]  Date: 26 Sep 2015    TYPE OF TEST: Peripheral Venous Testing    REASON FOR TEST  Pain in limb, Limb swelling    Right Leg:-  Deep venous thrombosis:           No  Superficial venous thrombosis:    No  Deep venous insufficiency:        Not examined  Superficial venous insufficiency: Not examined      INTERPRETATION/FINDINGS  Right leg :  Duplex images were obtained using 2-D gray scale, color flow, and  spectral Doppler analysis.  1. No evidence of deep venous thrombosis detected in the veins  visualized.  2. Deep veins visualized include the common femoral, femoral,  popliteal, posterior tibial and peroneal veins.  3. No evidence of superficial thrombosis detected.  4. Superficial veins visualized include the proximal great saphenous  vein.  5. No evidence of deep vein thrombosis in the contralateral common  femoral vein.    ADDITIONAL COMMENTS    I have personally reviewed the data relevant to the interpretation of  this  study.    TECHNOLOGIST: Jennifer McFarland, RVT  Signed: 09/26/2015 03:28 PM    PHYSICIAN: Mardella Nuckles, D.O.  Signed: 09/26/2015 03:56 PM

## 2015-09-26 NOTE — ED Provider Notes (Signed)
HPI Comments: Patient is a 65 y/o female w/ osteoarthritis, chronic knee pain, DM, who presents to the ER c/o right knee pain.  Patient states she was sent by her PCP for further evaluation to rule out possible DVT.  Patient denied any previous history of the same.  Patient states she sees Dr. Orson Slick, ortho for her chronic knee pain and osteoarthritis.  Patient is not a candidate for surgery due to her weight.  She has been taking tramadol and hydrocodone as needed with little relief in the symptoms.  She denied any recent long travels.  Patient is ambulatory, with slow steady gate.  No other symptoms or complaints at this time.      Patient is a 65 y.o. female presenting with knee pain. The history is provided by the patient.   Knee Pain    This is a recurrent problem.        Past Medical History:   Diagnosis Date   ??? Arthritis     knee, arms,    ??? Asthma    ??? Chronic obstructive pulmonary disease (HCC)    ??? Diabetes (HCC)    ??? H/O sinusitis    ??? Hypercholesterolemia    ??? Hypertension    ??? Toe fracture, left August 2015    4th toe       Past Surgical History:   Procedure Laterality Date   ??? ABDOMEN SURGERY PROC UNLISTED      4 surguries for blockages   ??? HX CARPAL TUNNEL RELEASE Bilateral    ??? HX CHOLECYSTECTOMY     ??? HX GYN     ??? HX HERNIA REPAIR      abdomen   ??? HX HYSTERECTOMY      tubes tied   ??? HX POLYPECTOMY  04/18/14         Family History:   Problem Relation Age of Onset   ??? Hypertension Mother    ??? Stroke Mother    ??? Diabetes Mother    ??? Thyroid Disease Mother    ??? Arthritis-rheumatoid Mother    ??? Cancer Father      colon polpe       Social History     Social History   ??? Marital status: SINGLE     Spouse name: N/A   ??? Number of children: N/A   ??? Years of education: N/A     Occupational History   ??? Not on file.     Social History Main Topics   ??? Smoking status: Never Smoker   ??? Smokeless tobacco: Never Used   ??? Alcohol use No   ??? Drug use: No   ??? Sexual activity: Not on file     Other Topics Concern    ??? 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 Denies history of smoking          ALLERGIES: Penicillins; Iodine; and Lactose    Review of Systems   Constitutional: Negative for chills, fatigue and fever.   HENT: Negative.  Negative for sore throat.    Eyes: Negative.    Respiratory: Negative for cough and shortness of breath.    Cardiovascular: Negative for chest pain and palpitations.   Gastrointestinal: Negative for abdominal pain, nausea and vomiting.   Genitourinary: Negative for dysuria.   Musculoskeletal: Positive for arthralgias.        Right knee pain   Skin: Negative.  Neurological: Negative for dizziness, weakness, light-headedness and headaches.   Psychiatric/Behavioral: Negative.    All other systems reviewed and are negative.      Vitals:    09/26/15 1355   BP: 128/74   Pulse: 74   Resp: 18   Temp: 98.4 ??F (36.9 ??C)   SpO2: 100%   Weight: 126.2 kg (278 lb 4 oz)   Height: 5\' 3"  (1.6 m)            Physical Exam   Constitutional: She is oriented to person, place, and time. She appears well-developed and well-nourished. No distress.   Morbidly obese   HENT:   Head: Normocephalic and atraumatic.   Mouth/Throat: Oropharynx is clear and moist.   Eyes: Conjunctivae are normal. No scleral icterus.   Neck: Neck supple. No JVD present. No tracheal deviation present.   Cardiovascular: Normal rate, regular rhythm and normal heart sounds.    Pulmonary/Chest: Effort normal and breath sounds normal. No respiratory distress. She has no wheezes.   Abdominal: Soft. She exhibits no distension. There is no tenderness. There is no rebound and no guarding.   Musculoskeletal: Normal range of motion. She exhibits tenderness. She exhibits no edema or deformity.        Legs:  Neurological: She is alert and oriented to person, place, and time. She has normal strength. Gait normal. GCS eye subscore is 4. GCS verbal subscore is 5. GCS motor subscore is 6.    Ambulatory with slow, steady gait   Skin: Skin is warm and dry. She is not diaphoretic.   Psychiatric: She has a normal mood and affect.   Nursing note and vitals reviewed.       MDM  Number of Diagnoses or Management Options  Acute pain of right knee:   Diagnosis management comments: 2:37 PM  65 y/o female c/o right knee pain ongoing for several weeks. Pt has hx of osteoarthritis; not a candidate for surgery per ortho due to weight and DM.  Patient states she has been taking tramadol and hydrocodone with little relief.  No previous hx of DVT.  Will plan on doppler of right leg and reassess.  Dwan BoltStacy L Jt Brabec, PA-C    3:57 PM  Doppler of right leg negative for DVT.  Discussed results with patient.  Will have follow up with pcp and ortho as outpatient for chronic pain.  All questions answered and patient in agreement with plan of care.  Will plan for discharge.  Dwan BoltStacy L Julian Askin, PA-C    Clinical Impression:  Right knee pain         Amount and/or Complexity of Data Reviewed  Tests in the radiology section of CPT??: ordered and reviewed    Risk of Complications, Morbidity, and/or Mortality  Presenting problems: moderate  Diagnostic procedures: moderate  Management options: moderate    Patient Progress  Patient progress: stable    ED Course       Procedures

## 2015-09-26 NOTE — ED Triage Notes (Addendum)
Patient complaints of bilateral knees pain for a week worsening today. Patient reports pain increased with movement  Patient reports swelling noted on both knees and she was sent to ED for further evaluation for blood clot  Patient reports fell down on both knees over a week ago

## 2015-09-26 NOTE — Procedures (Signed)
[facility]  *** FINAL REPORT ***    Name: Cheryl Paul, Cheryl Paul  MRN: [id number]  DOB: 28 Oct 1950  HIS Order #: [id number]  TRAKnet Visit #: [id number]  Date: 26 Sep 2015    TYPE OF TEST: Peripheral Venous Testing    REASON FOR TEST  Pain in limb, Limb swelling    Right Leg:-  Deep venous thrombosis:           No  Superficial venous thrombosis:    No  Deep venous insufficiency:        Not examined  Superficial venous insufficiency: Not examined      INTERPRETATION/FINDINGS  Right leg :  Duplex images were obtained using 2-D gray scale, color flow, and  spectral Doppler analysis.  1. No evidence of deep venous thrombosis detected in the veins  visualized.  2. Deep veins visualized include the common femoral, femoral,  popliteal, posterior tibial and peroneal veins.  3. No evidence of superficial thrombosis detected.  4. Superficial veins visualized include the proximal great saphenous  vein.  5. No evidence of deep vein thrombosis in the contralateral common  femoral vein.    ADDITIONAL COMMENTS    I have personally reviewed the data relevant to the interpretation of  this  study.    TECHNOLOGIST: Wynell BalloonJennifer McFarland, RVT  Signed: 09/26/2015 03:28 PM    PHYSICIAN: [place] Rozann Holts, D.O.  Signed: 09/26/2015 03:56 PM

## 2015-10-15 ENCOUNTER — Encounter: Attending: Family Medicine | Primary: Family Medicine

## 2015-10-29 ENCOUNTER — Ambulatory Visit
Admit: 2015-10-29 | Discharge: 2015-10-29 | Payer: PRIVATE HEALTH INSURANCE | Attending: Family Medicine | Primary: Family Medicine

## 2015-10-29 DIAGNOSIS — M129 Arthropathy, unspecified: Secondary | ICD-10-CM

## 2015-10-29 MED ORDER — BUPRENORPHINE 10 MCG/HOUR WEEKLY TRANSDERMAL PATCH
10 mcg/hour | MEDICATED_PATCH | TRANSDERMAL | 0 refills | Status: DC
Start: 2015-10-29 — End: 2015-11-20

## 2015-10-29 NOTE — Telephone Encounter (Signed)
The pain patch buprenorphine (BUTRANS) 10 mcg/hour     too expensive per pt is there something else Dr Delford FieldWright can prescribe.    [facility] [address]

## 2015-10-29 NOTE — Telephone Encounter (Signed)
Pt aware to come by office to pickup coupon for trial offer & savings card for butrans patch.     Also need Pt to initial at the bottom of each page of the controlled substance contract.     Pt [facility] [address] [facility] narcotics.

## 2015-10-29 NOTE — ACP (Advance Care Planning) (Signed)
Pt declined ACP paperwork at previous visit.

## 2015-10-29 NOTE — Progress Notes (Signed)
HISTORY OF PRESENT ILLNESS  Cheryl Paul is a 65 y.o. female.  HPI  She has an ongoing concern    Patient reports b/l knee pain, R>L.  She reports chronic pain in her back and thigh also.  She is currently using a walker  She reports that tramadol and norco has not been beneficial.  Patient reports that she has seen ortho but needs to loose weight prior to surgery.  She has reported a 20 lbs weight loss but reports she needs to loose another 20 lbs.  Pain is 9/10    She has been doing water aerobics- she has had 5 sessions  She is requesting pain control    She has no concerns and reports that she is doing well.  She is taking her medications with no adverse side effects, she is requesting refills today.  She denies CP, SOB, dyspnea, edema, N/T or myalgias.    She was seen in the ED on 09/26/15 and had a negative workup for DVT    Allergies   Allergen Reactions   ??? Penicillins Itching   ??? Iodine Rash   ??? Lactose Diarrhea     Lactose intolerance       Past Medical History:   Diagnosis Date   ??? Arthritis     knee, arms,    ??? Asthma    ??? Chronic obstructive pulmonary disease ([place])    ??? Diabetes ([place])    ??? H/O sinusitis    ??? Hypercholesterolemia    ??? Hypertension    ??? Toe fracture, left August 2015    4th toe       Family History   Problem Relation Age of Onset   ??? Hypertension Mother    ??? Stroke Mother    ??? Diabetes Mother    ??? Thyroid Disease Mother    ??? Arthritis-rheumatoid Mother    ??? Cancer Father      colon polpe       Social History   Substance Use Topics   ??? Smoking status: Never Smoker   ??? Smokeless tobacco: Never Used   ??? Alcohol use No        Current Outpatient Prescriptions   Medication Sig   ??? buprenorphine (BUTRANS) 10 mcg/hour 1 Patch by TransDERmal route every seven (7) days. Indications: Chronic Pain with Opioid Tolerance   ??? atorvastatin (LIPITOR) 20 mg tablet TAKE ONE TABLET BY MOUTH ONE TIME DAILY   ??? lisinopril (PRINIVIL, ZESTRIL) 10 mg tablet TAKE 1 TABLET BY MOUTH DAILY    ??? glipiZIDE (GLUCOTROL) 5 mg tablet TAKE ONE TABLET BY MOUTH TWICE DAILY   ??? lancets (ONE TOUCH DELICA) 33 gauge misc Use daily to test blood sugar    E11.9   ??? budesonide-formoterol (SYMBICORT) 80-4.5 mcg/actuation HFAA inhaler Take 2 Puffs by inhalation two (2) times a day.   ??? metFORMIN (GLUCOPHAGE) 1,000 mg tablet Take 1 Tab by mouth two (2) times daily (with meals).   ??? Lancing Device with Lancets (ONE TOUCH DELICA) kit Test blood sugar daily DX E11.9   ??? alcohol swabs padm Use daily to test blood sugar  DX E11.9   ??? glucose blood VI test strips (ADVOCATE REDI-CODE+) strip Check blood sugar daily before breakfast   DX E11.9   ??? ADVOCATE REDI-CODE+ CTRL LOW soln    ??? ULTRA THIN LANCETS 31 gauge misc    ??? ergocalciferol (ERGOCALCIFEROL) 50,000 unit capsule    ??? nystatin (MYCOSTATIN) topical cream Apply  to  affected area two (2) times a day.   ??? albuterol (VENTOLIN HFA) 90 mcg/actuation inhaler Take 2 Puffs by inhalation every six (6) hours as needed for Wheezing.   ??? fluticasone (FLONASE) 50 mcg/actuation nasal spray 2 Sprays by Both Nostrils route daily.   ??? AFLURIA 2016-2017, PF, syrg injection Flu shot @@ [facility] 01/08/15     No current facility-administered medications for this visit.         Past Surgical History:   Procedure Laterality Date   ??? ABDOMEN SURGERY PROC UNLISTED      4 surguries for blockages   ??? HX CARPAL TUNNEL RELEASE Bilateral    ??? HX CHOLECYSTECTOMY     ??? HX GYN     ??? HX HERNIA REPAIR      abdomen   ??? HX HYSTERECTOMY      tubes tied   ??? HX POLYPECTOMY  04/18/14     ROS  See HPI    Visit Vitals   ??? BP 143/72   ??? Pulse 77   ??? Temp 98.1 ??F (36.7 ??C) (Oral)   ??? Resp 18   ??? Ht 5' 3"  (1.6 m)   ??? Wt 287 lb 9.6 oz (130.5 kg)   ??? SpO2 99%   ??? BMI 50.95 kg/m2     Physical Exam   Constitutional: She is oriented to person, place, and time.   Obese habitus   Musculoskeletal:   Using a walker   Neurological: She is alert and oriented to person, place, and time.   Skin: Skin is warm and dry.    Psychiatric: She has a normal mood and affect. Her behavior is normal.     ASSESSMENT and PLAN    ICD-10-CM ICD-9-CM    1. Arthritis, multiple joint involvement M12.9 716.99 buprenorphine (BUTRANS) 10 mcg/hour   2. Chronic pain of right knee M25.561 719.46     G89.29 338.29      -Pain contract completed today  -RTC as previously scheduled  -Follow up with ortho as scheduled    -Patient classified as morbidly obese  -Advised continued exercise and dietary modifications.   -Patient agrees with assessment and plan    According to the CDC an increased BMI can lead to "all-causes of death (mortality), High blood pressure (Hypertension), High LDL cholesterol, low HDL cholesterol, or high levels of triglycerides (Dyslipidemia), Type 2 diabetes, Coronary heart disease, Stroke, Gallbladder disease, Osteoarthritis (a breakdown of cartilage and bone within a joint), Sleep apnea and breathing problems, Chronic inflammation and increased oxidative stress, some cancers (endometrial, breast, colon, kidney, gallbladder, and liver), Low quality of life, Mental illness such as clinical depression, anxiety, and other mental disorders and Body pain and difficulty with physical functioning."    BMI Weight Status   Below 18.5 Underweight   18.5 ??? 24.9 Normal or Healthy Weight   25.0 ??? 29.9 Overweight   30.0 and Above Obese   40.0 and Above Morbid Obesity     Follow-up Disposition:   Return if symptoms worsen or fail to improve.   Risk and benefits of new medication discussed in detail when indicated, patient was given the opportunity to ask questions   AVS provided  reviewed diet, exercise and weight control when indicated  Alarm signals discussed. ER precautions reviewed when indicated  Plan of care reviewed with patient. Understanding verbalized and they are in agreement with plan of care.     Lemont Fillers, DO

## 2015-10-29 NOTE — Patient Instructions (Signed)
A Healthy Lifestyle: Care Instructions  Your Care Instructions  A healthy lifestyle can help you feel good, stay at a healthy weight, and have plenty of energy for both work and play. A healthy lifestyle is something you can share with your whole family.  A healthy lifestyle also can lower your risk for serious health problems, such as high blood pressure, heart disease, and diabetes.  You can follow a few steps listed below to improve your health and the health of your family.  Follow-up care is a key part of your treatment and safety. Be sure to make and go to all appointments, and call your doctor if you are having problems. It???s also a good idea to know your test results and keep a list of the medicines you take.  How can you care for yourself at home?  ?? Do not eat too much sugar, fat, or fast foods. You can still have dessert and treats now and then. The goal is moderation.  ?? Start small to improve your eating habits. Pay attention to portion sizes, drink less juice and soda pop, and eat more fruits and vegetables.  ?? Eat a healthy amount of food. A 3-ounce serving of meat, for example, is about the size of a deck of cards. Fill the rest of your plate with vegetables and whole grains.  ?? Limit the amount of soda and sports drinks you have every day. Drink more water when you are thirsty.  ?? Eat at least 5 servings of fruits and vegetables every day. It may seem like a lot, but it is not hard to reach this goal. A serving or helping is 1 piece of fruit, 1 cup of vegetables, or 2 cups of leafy, raw vegetables. Have an apple or some carrot sticks as an afternoon snack instead of a candy bar. Try to have fruits and/or vegetables at every meal.  ?? Make exercise part of your daily routine. You may want to start with simple activities, such as walking, bicycling, or slow swimming. Try to be active 30 to 60 minutes every day. You do not need to do all 30 to 60  minutes all at once. For example, you can exercise 3 times a day for 10 or 20 minutes. Moderate exercise is safe for most people, but it is always a good idea to talk to your doctor before starting an exercise program.  ?? Keep moving. Mow the lawn, work in the garden, or clean your house. Take the stairs instead of the elevator at work.  ?? If you smoke, quit. People who smoke have an increased risk for heart attack, stroke, cancer, and other lung illnesses. Quitting is hard, but there are ways to boost your chance of quitting tobacco for good.  ?? Use nicotine gum, patches, or lozenges.  ?? Ask your doctor about stop-smoking programs and medicines.  ?? Keep trying.  In addition to reducing your risk of diseases in the future, you will notice some benefits soon after you stop using tobacco. If you have shortness of breath or asthma symptoms, they will likely get better within a few weeks after you quit.  ?? Limit how much alcohol you drink. Moderate amounts of alcohol (up to 2 drinks a day for men, 1 drink a day for women) are okay. But drinking too much can lead to liver problems, high blood pressure, and other health problems.  Family health  If you have a family, there are many things you can do   together to improve your health.  ?? Eat meals together as a family as often as possible.  ?? Eat healthy foods. This includes fruits, vegetables, lean meats and dairy, and whole grains.  ?? Include your family in your fitness plan. Most people think of activities such as jogging or tennis as the way to fitness, but there are many ways you and your family can be more active. Anything that makes you breathe hard and gets your heart pumping is exercise. Here are some tips:  ?? Walk to do errands or to take your child to school or the bus.  ?? Go for a family bike ride after dinner instead of watching TV.  Where can you learn more?  Go to http://www.healthwise.net/GoodHelpConnections.   Enter U807 in the search box to learn more about "A Healthy Lifestyle: Care Instructions."  Current as of: December 11, 2014  Content Version: 11.2  ?? 2006-2017 Healthwise[employer]. Care instructions adapted under license by [employer] (which disclaims liability or warranty for this information). If you have questions about a medical condition or this instruction, always ask your healthcare professional. Healthwise, Incorporated disclaims any warranty or liability for your use of this information.

## 2015-10-29 NOTE — Progress Notes (Signed)
Pt is here for ED F/U from [facility] 09/26/15 for R knee pain. Pt going to orthopaedics for knee pain. States she just wants to discuss this with Dr Delford FieldWright & let her know whats going on.      1. Have you been to the ER, urgent care clinic since your last visit?  Hospitalized since your last visit?Yes 09/26/15 [facility], R knee pain,  06/29/15 cough    2. Have you seen or consulted any other health care providers outside of the [facility] since your last visit?  Include any pap smears or colon screening. Yes Dr Kennyth LoseBowman orthro 07/19/15    Pt [facility] for [facility] therapy by Dr Orson SlickBowman, orthopaedics

## 2015-10-29 NOTE — ACP (Advance Care Planning) (Signed)
Pt declined ACP paperwork at previous visit.

## 2015-10-30 NOTE — Telephone Encounter (Signed)
Called and spoke with the pt. She stated that she is unable to use the savings card due to her insurance type.   Verified eligibility information on the savings card, she is not eligible.

## 2015-10-30 NOTE — Telephone Encounter (Signed)
Patient called in and stated that she can not use the coupon card and can not afford $100 for the medication Butrans. Patient want to know if something else can be called in for her. Please advise.

## 2015-10-30 NOTE — Telephone Encounter (Signed)
Called the pt to informed her that per the provider "Please advise patient that I don't provide any other pain medicines but the ones that she has already tried and says did not work. I have no other options for her, she should see ortho" she previously stated that she is scheduled to see ortho July 7th.  No answer. Left her a message requesting return call.

## 2015-10-30 NOTE — Telephone Encounter (Signed)
Please advise patient that I don't provide any other pain medicines but the ones that she has already tried and says did not work.  I have no other options for her, she should see ortho

## 2015-11-11 ENCOUNTER — Telehealth

## 2015-11-11 NOTE — Telephone Encounter (Signed)
Patient called in and stated that she was informed by her insurance company that if Dr. Delford FieldWright give her a script for a three month supply of the Butrans patch she can get in for $150 dollars instead of a $100 dollars a month. Please advise and call patient with more information.

## 2015-11-12 NOTE — Telephone Encounter (Signed)
Pt aware this would need to be covered by Dr Delford FieldWright once she returns next week.

## 2015-11-12 NOTE — Telephone Encounter (Signed)
This prescription needs to be addressed by Dr. Delford FieldWright when she returns.

## 2015-11-20 MED ORDER — BUPRENORPHINE 10 MCG/HOUR WEEKLY TRANSDERMAL PATCH
10 mcg/hour | MEDICATED_PATCH | TRANSDERMAL | 0 refills | Status: DC
Start: 2015-11-20 — End: 2016-07-22

## 2015-11-20 NOTE — Telephone Encounter (Signed)
Which pharmacy?

## 2015-11-20 NOTE — Telephone Encounter (Signed)
[facility]

## 2015-11-20 NOTE — Telephone Encounter (Signed)
Script sent

## 2015-11-20 NOTE — Telephone Encounter (Signed)
RX printed faxed to [facility] @ [phone].  Pt's insurance# [id number]

## 2015-11-20 NOTE — Telephone Encounter (Signed)
LM for Pt that requested RX for 3 month supply has been faxed to her pharmacy.

## 2015-11-20 NOTE — Telephone Encounter (Signed)
LM for Pt to inquire what pharmacy she is wants the Butrans to be sent to,  can it be escribed or does it need to be faxed by hand. & her account number if it has to be faxed by hand

## 2015-12-04 NOTE — Telephone Encounter (Signed)
[facility] called to request One Touch Verio test strips.    Fax: [phone]

## 2015-12-05 MED ORDER — BLOOD SUGAR DIAGNOSTIC TEST STRIPS
ORAL_STRIP | 3 refills | Status: DC
Start: 2015-12-05 — End: 2018-02-23

## 2015-12-06 NOTE — Telephone Encounter (Signed)
Pt states that the Butran patch had patient to break out on the right front of arm.  Rash was reddish and used nesporin.    Pt would like to know if she should try relocating the area where she would place the patch.    Pharmacist suggested that the patient try another area on her front of arm or chest or right side of chest or back.    Pt would like to know what PCP would suggest.

## 2015-12-09 NOTE — Telephone Encounter (Signed)
LM for Pt to place patch in another location & if the rash continues she would need to stop the Butrans patch.   Request call back if any questions.

## 2015-12-09 NOTE — Telephone Encounter (Signed)
She may try relocating the patch, if rash resumes she should d/c medications

## 2015-12-10 NOTE — Telephone Encounter (Signed)
Patient called in and stated that she was using the butrans patch and it caused a rash on her arm. Patient called and informed the office of this. Patient stated that now she is out of $105. Per Patient the pharmacy told her that she should try another Musician like Teda or Wallace. Patient wants to know which one she should use. Please advise.

## 2015-12-11 NOTE — Telephone Encounter (Signed)
Pt aware of Dr Lynelle Doctor message concerning the butrans patch. Pt states she got an injection in her knee & with that & the butrans patch she isn't having too much pain.  Pt states she will decide what to do & call back.

## 2015-12-11 NOTE — Telephone Encounter (Signed)
Please advise patient that when I order butrans patch I do not specify which brand it really depends on what insurance will cover from my understanding.  I cannot recommend a specific brand and I cannot guarantee that she will not also have a reaction to that patch.  The only other thing I can offer is to put her back on pills for her pain

## 2015-12-27 MED ORDER — ERGOCALCIFEROL (VITAMIN D2) 50,000 UNIT CAP
1250 mcg (50,000 unit) | ORAL_CAPSULE | ORAL | 0 refills | Status: DC
Start: 2015-12-27 — End: 2018-02-18

## 2015-12-27 NOTE — Progress Notes (Signed)
Called patient and left call back number.  The message was to inform of PA-C's notes.

## 2015-12-27 NOTE — Progress Notes (Signed)
Please let patient know that I have reviewed her labs and sent in a prescription for Vitamin D supplementation that she will need to take once weekly.  Please have her follow up with PCP.

## 2015-12-30 NOTE — Telephone Encounter (Signed)
Hey Cindee this is just [place].  Thanks

## 2015-12-30 NOTE — Telephone Encounter (Signed)
Pt was advised of Vit D script sent to the pharmacy.    Just wanted to make everyone aware this patient decided to transfer care to Dr. Meridee ScoreJ. Bowers.

## 2015-12-31 ENCOUNTER — Inpatient Hospital Stay: Admit: 2015-12-31 | Payer: BLUE CROSS/BLUE SHIELD | Primary: Family Medicine

## 2015-12-31 DIAGNOSIS — E119 Type 2 diabetes mellitus without complications: Secondary | ICD-10-CM

## 2015-12-31 LAB — HEMOGLOBIN A1C W/O EAG: Hemoglobin A1c: 8.9 % — ABNORMAL HIGH (ref 4.2–5.6)

## 2016-01-02 NOTE — Progress Notes (Signed)
Patient is aware: Please let patient know that I have reviewed her labs and sent in a prescription for Vitamin D supplementation that she will need to take once weekly.  Please have her follow up with PCP.

## 2016-01-17 ENCOUNTER — Encounter: Attending: Family Medicine | Primary: Family Medicine

## 2016-02-20 ENCOUNTER — Encounter: Attending: Family Medicine | Primary: Family Medicine

## 2016-03-18 ENCOUNTER — Encounter

## 2016-04-06 ENCOUNTER — Inpatient Hospital Stay: Admit: 2016-04-06 | Payer: BLUE CROSS/BLUE SHIELD | Primary: Family Medicine

## 2016-04-06 DIAGNOSIS — E039 Hypothyroidism, unspecified: Secondary | ICD-10-CM

## 2016-04-06 LAB — METABOLIC PANEL, COMPREHENSIVE
A-G Ratio: 1.2 (ref 0.8–1.7)
ALT (SGPT): 18 U/L (ref 13–56)
AST (SGOT): 12 U/L — ABNORMAL LOW (ref 15–37)
Albumin: 3.6 g/dL (ref 3.4–5.0)
Alk. phosphatase: 67 U/L (ref 45–117)
Anion gap: 8 mmol/L (ref 3.0–18)
BUN/Creatinine ratio: 15 (ref 12–20)
BUN: 12 MG/DL (ref 7.0–18)
Bilirubin, total: 0.2 MG/DL (ref 0.2–1.0)
CO2: 28 mmol/L (ref 21–32)
Calcium: 8.4 MG/DL — ABNORMAL LOW (ref 8.5–10.1)
Chloride: 103 mmol/L (ref 100–108)
Creatinine: 0.82 MG/DL (ref 0.6–1.3)
GFR est AA: >60 mL/min/{1.73_m2} (ref >60)
GFR est non-AA: >60 mL/min/{1.73_m2} (ref >60)
Globulin: 3.1 g/dL (ref 2.0–4.0)
Glucose: 267 mg/dL — ABNORMAL HIGH (ref 74–99)
Potassium: 4.2 mmol/L (ref 3.5–5.5)
Protein, total: 6.7 g/dL (ref 6.4–8.2)
Sodium: 139 mmol/L (ref 136–145)

## 2016-04-06 LAB — CBC WITH AUTOMATED DIFF
ABS. BASOPHILS: 0 10*3/uL (ref 0.0–0.06)
ABS. EOSINOPHILS: 0.2 10*3/uL (ref 0.0–0.4)
ABS. LYMPHOCYTES: 1.9 10*3/uL (ref 0.9–3.6)
ABS. MONOCYTES: 0.4 10*3/uL (ref 0.05–1.2)
ABS. NEUTROPHILS: 4.7 10*3/uL (ref 1.8–8.0)
BASOPHILS: 0 % (ref 0–2)
EOSINOPHILS: 2 % (ref 0–5)
HCT: 41.1 % (ref 35.0–45.0)
HGB: 12.8 g/dL (ref 12.0–16.0)
LYMPHOCYTES: 26 % (ref 21–52)
MCH: 27.2 PG (ref 24.0–34.0)
MCHC: 31.1 g/dL (ref 31.0–37.0)
MCV: 87.4 FL (ref 74.0–97.0)
MONOCYTES: 6 % (ref 3–10)
MPV: 13 FL — ABNORMAL HIGH (ref 9.2–11.8)
NEUTROPHILS: 66 % (ref 40–73)
PLATELET: 157 10*3/uL (ref 135–420)
RBC: 4.7 M/uL (ref 4.20–5.30)
RDW: 13.7 % (ref 11.6–14.5)
WBC: 7.2 10*3/uL (ref 4.6–13.2)

## 2016-04-06 LAB — HEMOGLOBIN A1C WITH EAG
Est. average glucose: 252 mg/dL
Hemoglobin A1c: 10.4 % — ABNORMAL HIGH (ref 4.2–5.6)

## 2016-04-06 LAB — LIPID PANEL
CHOL/HDL Ratio: 5 (ref 0–5.0)
Cholesterol, total: 218 MG/DL — ABNORMAL HIGH (ref <200)
HDL Cholesterol: 44 MG/DL (ref 40–60)
Triglyceride: 458 MG/DL — ABNORMAL HIGH (ref <150)

## 2016-04-06 LAB — TSH 3RD GENERATION: TSH: 2.8 u[IU]/mL (ref 0.36–3.74)

## 2016-04-06 LAB — VITAMIN D, 25 HYDROXY: Vitamin D 25-Hydroxy: 16.1 ng/mL — ABNORMAL LOW (ref 30–100)

## 2016-04-08 ENCOUNTER — Ambulatory Visit

## 2016-04-08 ENCOUNTER — Inpatient Hospital Stay: Admit: 2016-04-08 | Payer: BLUE CROSS/BLUE SHIELD | Primary: Family Medicine

## 2016-04-08 DIAGNOSIS — Z780 Asymptomatic menopausal state: Secondary | ICD-10-CM

## 2016-04-08 DIAGNOSIS — Z1231 Encounter for screening mammogram for malignant neoplasm of breast: Secondary | ICD-10-CM

## 2016-05-14 NOTE — Telephone Encounter (Signed)
error 

## 2016-07-22 ENCOUNTER — Emergency Department: Admit: 2016-07-22 | Payer: BLUE CROSS/BLUE SHIELD | Primary: Family Medicine

## 2016-07-22 ENCOUNTER — Inpatient Hospital Stay
Admit: 2016-07-22 | Discharge: 2016-07-22 | Disposition: A | Discharge status: Home / Self Care | Payer: BLUE CROSS/BLUE SHIELD | Attending: Emergency Medicine

## 2016-07-22 DIAGNOSIS — M25551 Pain in right hip: Secondary | ICD-10-CM

## 2016-07-22 LAB — EKG 12-LEAD
Atrial Rate: 76 {beats}/min
Diagnosis: NORMAL
P Axis: 51 degrees
P-R Interval: 116 ms
Q-T Interval: 410 ms
QRS Duration: 84 ms
QTc Calculation (Bazett): 461 ms
R Axis: 3 degrees
T Axis: 71 degrees
Ventricular Rate: 76 {beats}/min

## 2016-07-22 LAB — CBC WITH AUTOMATED DIFF
ABS. BASOPHILS: 0 10*3/uL (ref 0.0–0.06)
ABS. EOSINOPHILS: 0.1 10*3/uL (ref 0.0–0.4)
ABS. LYMPHOCYTES: 2.4 10*3/uL (ref 0.9–3.6)
ABS. MONOCYTES: 0.5 10*3/uL (ref 0.05–1.2)
ABS. NEUTROPHILS: 5.1 10*3/uL (ref 1.8–8.0)
BASOPHILS: 0 % (ref 0–2)
EOSINOPHILS: 2 % (ref 0–5)
HCT: 43.9 % (ref 35.0–45.0)
HGB: 13.6 g/dL (ref 12.0–16.0)
LYMPHOCYTES: 30 % (ref 21–52)
MCH: 26.5 PG (ref 24.0–34.0)
MCHC: 31 g/dL (ref 31.0–37.0)
MCV: 85.6 FL (ref 74.0–97.0)
MONOCYTES: 6 % (ref 3–10)
MPV: 12.7 FL — ABNORMAL HIGH (ref 9.2–11.8)
NEUTROPHILS: 62 % (ref 40–73)
PLATELET: 170 10*3/uL (ref 135–420)
RBC: 5.13 M/uL (ref 4.20–5.30)
RDW: 13.6 % (ref 11.6–14.5)
WBC: 8.1 10*3/uL (ref 4.6–13.2)

## 2016-07-22 LAB — METABOLIC PANEL, COMPREHENSIVE
A-G Ratio: 1.1 (ref 0.8–1.7)
ALT (SGPT): 19 U/L (ref 13–56)
AST (SGOT): 15 U/L (ref 15–37)
Albumin: 3.9 g/dL (ref 3.4–5.0)
Alk. phosphatase: 62 U/L (ref 45–117)
Anion gap: 8 mmol/L (ref 3.0–18)
BUN/Creatinine ratio: 15 (ref 12–20)
BUN: 10 MG/DL (ref 7.0–18)
Bilirubin, total: 0.5 MG/DL (ref 0.2–1.0)
CO2: 30 mmol/L (ref 21–32)
Calcium: 9.7 MG/DL (ref 8.5–10.1)
Chloride: 102 mmol/L (ref 100–108)
Creatinine: 0.68 MG/DL (ref 0.6–1.3)
GFR est AA: >60 mL/min/{1.73_m2} (ref >60)
GFR est non-AA: >60 mL/min/{1.73_m2} (ref >60)
Globulin: 3.7 g/dL (ref 2.0–4.0)
Glucose: 184 mg/dL — ABNORMAL HIGH (ref 74–99)
Potassium: 3.9 mmol/L (ref 3.5–5.5)
Protein, total: 7.6 g/dL (ref 6.4–8.2)
Sodium: 140 mmol/L (ref 136–145)

## 2016-07-22 LAB — URINALYSIS W/ RFLX MICROSCOPIC
Bilirubin: NEGATIVE
Blood: NEGATIVE
Glucose: NEGATIVE mg/dL
Ketone: NEGATIVE mg/dL
Leukocyte Esterase: NEGATIVE
Nitrites: NEGATIVE
Protein: NEGATIVE mg/dL
Specific gravity: 1.018 (ref 1.005–1.030)
Urobilinogen: 0.2 EU/dL (ref 0.2–1.0)
pH (UA): 5 (ref 5.0–8.0)

## 2016-07-22 LAB — EKG, 12 LEAD, INITIAL
Atrial Rate: 76 {beats}/min
Calculated P Axis: 51 degrees
Calculated R Axis: 3 degrees
Calculated T Axis: 71 degrees
Diagnosis: NORMAL
P-R Interval: 116 ms
Q-T Interval: 410 ms
QRS Duration: 84 ms
QTC Calculation (Bezet): 461 ms
Ventricular Rate: 76 {beats}/min

## 2016-07-22 LAB — LIPASE: Lipase: 188 U/L (ref 73–393)

## 2016-07-22 LAB — CARDIAC PANEL,(CK, CKMB & TROPONIN)
CK - MB: <1 ng/ml (ref <3.6)
CK: 59 U/L (ref 26–192)
Troponin-I, QT: <0.02 NG/ML (ref 0.00–0.06)

## 2016-07-22 MED ORDER — ACETAMINOPHEN 325 MG TABLET
325 mg | ORAL | Status: AC
Start: 2016-07-22 — End: 2016-07-22
  Administered 2016-07-22: 22:00:00 via ORAL

## 2016-07-22 MED ORDER — ACETAMINOPHEN 325 MG TABLET
325 mg | ORAL_TABLET | ORAL | 0 refills | Status: DC | PRN
Start: 2016-07-22 — End: 2017-09-24

## 2016-07-22 MED ORDER — ACETAMINOPHEN 325 MG TABLET
325 mg | ORAL_TABLET | ORAL | 0 refills | Status: DC | PRN
Start: 2016-07-22 — End: 2016-07-22

## 2016-07-22 MED FILL — TYLENOL 325 MG TABLET: 325 mg | ORAL | Qty: 2

## 2016-07-22 NOTE — ED Notes (Signed)
Patient ambulate with out difficulty patient had a visitor at bedside.

## 2016-07-22 NOTE — ED Provider Notes (Addendum)
EMERGENCY DEPARTMENT HISTORY AND PHYSICAL EXAM    Date: 07/22/2016  Patient Name: Cheryl Paul    History of Presenting Illness     Chief Complaint   Patient presents with   ??? Hip Pain   ??? Abdominal Pain         History Provided By: Patient    Chief Complaint: right flank, right rib, and right hip pain   Duration: 3 Days  Timing:  Constant  Location: right flank and right hip  Quality: Dull  Severity: 8 out of 10  Modifying Factors: no aggravating or alleviating factors  Associated Symptoms: denies any other associated signs or symptoms      Additional History (Context): Cheryl Paul is a 66 y.o. female with asthma, DM, HTN, and COPD who presents with C/O right hip, right flank, and right rib pain x3 days. Patient reports constant right hip and right flank pain x3 days, describes her pain as dull with intermittent sharp episodes. Patient also reports sharp pains under right ribs for the last 3 days. Patient denies injury to hip or back, falling, blood in urine, dysuria, cough, fever, chills, abdominal pain, or any other symptoms or complaints.     PCP: Gwyneth Revels, MD    Current Facility-Administered Medications   Medication Dose Route Frequency Provider Last Rate Last Dose   ??? acetaminophen (TYLENOL) tablet 650 mg  650 mg Oral NOW Orpah Clinton, NP         Current Outpatient Prescriptions   Medication Sig Dispense Refill   ??? insulin glargine (TOUJEO SOLOSTAR U-300 INSULIN) 300 unit/mL (1.5 mL) inpn 40 Units by SubCUTAneous route daily.     ??? acetaminophen (TYLENOL) 325 mg tablet Take 2 Tabs by mouth every four (4) hours as needed for Pain. 20 Tab 0   ??? ergocalciferol (ERGOCALCIFEROL) 50,000 unit capsule Take 1 Cap by mouth every seven (7) days. 8 Cap 0   ??? atorvastatin (LIPITOR) 20 mg tablet TAKE ONE TABLET BY MOUTH ONE TIME DAILY 90 Tab 2   ??? lisinopril (PRINIVIL, ZESTRIL) 10 mg tablet TAKE 1 TABLET BY MOUTH DAILY 90 Tab 1   ??? nystatin (MYCOSTATIN) topical cream Apply  to affected area two (2)  times a day. 15 g 1   ??? glipiZIDE (GLUCOTROL) 5 mg tablet TAKE ONE TABLET BY MOUTH TWICE DAILY 180 Tab 1   ??? albuterol (VENTOLIN HFA) 90 mcg/actuation inhaler Take 2 Puffs by inhalation every six (6) hours as needed for Wheezing. 3 Inhaler 0   ??? budesonide-formoterol (SYMBICORT) 80-4.5 mcg/actuation HFAA inhaler Take 2 Puffs by inhalation two (2) times a day. 3 Inhaler 1   ??? metFORMIN (GLUCOPHAGE) 1,000 mg tablet Take 1 Tab by mouth two (2) times daily (with meals). 180 Tab 1   ??? fluticasone (FLONASE) 50 mcg/actuation nasal spray 2 Sprays by Both Nostrils route daily. 1 Bottle 0   ??? glucose blood VI test strips (ONETOUCH VERIO) strip Check blood sugar daily. DX E11.9 100 Strip 3   ??? lancets (ONE TOUCH DELICA) 33 gauge misc Use daily to test blood sugar    E11.9 100 Lancet 3   ??? Lancing Device with Lancets (ONE TOUCH DELICA) kit Test blood sugar daily DX E11.9 1 Kit 0   ??? alcohol swabs padm Use daily to test blood sugar  DX E11.9 100 Pad 1   ??? glucose blood VI test strips (ADVOCATE REDI-CODE+) strip Check blood sugar daily before breakfast   DX E11.9 50 Strip 3   ???  ULTRA THIN LANCETS 31 gauge misc          Past History     Past Medical History:  Past Medical History:   Diagnosis Date   ??? Arthritis     knee, arms,    ??? Asthma    ??? Chronic obstructive pulmonary disease ([place])    ??? Diabetes ([place])    ??? H/O sinusitis    ??? Hypercholesterolemia    ??? Hypertension    ??? Toe fracture, left August 2015    4th toe       Past Surgical History:  Past Surgical History:   Procedure Laterality Date   ??? ABDOMEN SURGERY PROC UNLISTED      4 surguries for blockages   ??? HX CARPAL TUNNEL RELEASE Bilateral    ??? HX CHOLECYSTECTOMY     ??? HX GYN     ??? HX HERNIA REPAIR      abdomen   ??? HX HYSTERECTOMY      tubes tied   ??? HX POLYPECTOMY  04/18/14       Family History:  Family History   Problem Relation Age of Onset   ??? Hypertension Mother    ??? Stroke Mother    ??? Diabetes Mother    ??? Thyroid Disease Mother    ??? Arthritis-rheumatoid Mother     ??? Cancer Father      colon polpe   ??? Cancer Maternal Aunt        Social History:  Social History   Substance Use Topics   ??? Smoking status: Never Smoker   ??? Smokeless tobacco: Never Used   ??? Alcohol use No       Allergies:  Allergies   Allergen Reactions   ??? Penicillins Itching   ??? Butrans [Buprenorphine] Rash   ??? Iodine Rash   ??? Lactose Diarrhea     Lactose intolerance         Review of Systems   Review of Systems   Constitutional: Negative for chills and fever.   Respiratory: Negative for cough and shortness of breath.    Cardiovascular: Negative for chest pain.   Gastrointestinal: Negative for abdominal pain, nausea and vomiting.   Genitourinary: Positive for flank pain. Negative for difficulty urinating, dysuria and urgency.   Musculoskeletal: Positive for arthralgias (right hip). Negative for back pain.   All other systems reviewed and are negative.    All Other Systems Negative  Physical Exam     Vitals:    07/22/16 1416   BP: (!) 161/91   Pulse: 77   Resp: 18   Temp: 98 ??F (36.7 ??C)   SpO2: 98%   Weight: 126.6 kg (279 lb)   Height: _0  (1.6 m)     Physical Exam   Constitutional: She is oriented to person, place, and time. She appears well-developed and well-nourished. No distress.   HENT:   Head: Normocephalic.   Eyes: Pupils are equal, round, and reactive to light.   Neck: Normal range of motion. Neck supple.   Cardiovascular: Normal rate, regular rhythm, normal heart sounds and intact distal pulses.    Pulmonary/Chest: Effort normal and breath sounds normal. No respiratory distress. She has no wheezes. She has no rales.   Abdominal: Soft. Bowel sounds are normal. She exhibits no distension. There is no tenderness. There is no rebound, no guarding and no CVA tenderness.   Musculoskeletal:   Tender to palpation over right lateral hip. No deformity, ecchymosis, warmth, or erythema.  Neurological: She is alert and oriented to person, place, and time.   Skin: Skin is warm and dry. She is not diaphoretic.    Nursing note and vitals reviewed.     Diagnostic Study Results     Labs -     Recent Results (from the past 12 hour(s))   CBC WITH AUTOMATED DIFF    Collection Time: 07/22/16  3:00 PM   Result Value Ref Range    WBC 8.1 4.6 - 13.2 K/uL    RBC 5.13 4.20 - 5.30 M/uL    HGB 13.6 12.0 - 16.0 g/dL    HCT 43.9 35.0 - 45.0 %    MCV 85.6 74.0 - 97.0 FL    MCH 26.5 24.0 - 34.0 PG    MCHC 31.0 31.0 - 37.0 g/dL    RDW 13.6 11.6 - 14.5 %    PLATELET 170 135 - 420 K/uL    MPV 12.7 (H) 9.2 - 11.8 FL    NEUTROPHILS 62 40 - 73 %    LYMPHOCYTES 30 21 - 52 %    MONOCYTES 6 3 - 10 %    EOSINOPHILS 2 0 - 5 %    BASOPHILS 0 0 - 2 %    ABS. NEUTROPHILS 5.1 1.8 - 8.0 K/UL    ABS. LYMPHOCYTES 2.4 0.9 - 3.6 K/UL    ABS. MONOCYTES 0.5 0.05 - 1.2 K/UL    ABS. EOSINOPHILS 0.1 0.0 - 0.4 K/UL    ABS. BASOPHILS 0.0 0.0 - 0.06 K/UL    DF AUTOMATED     METABOLIC PANEL, COMPREHENSIVE    Collection Time: 07/22/16  3:00 PM   Result Value Ref Range    Sodium 140 136 - 145 mmol/L    Potassium 3.9 3.5 - 5.5 mmol/L    Chloride 102 100 - 108 mmol/L    CO2 30 21 - 32 mmol/L    Anion gap 8 3.0 - 18 mmol/L    Glucose 184 (H) 74 - 99 mg/dL    BUN 10 7.0 - 18 MG/DL    Creatinine 0.68 0.6 - 1.3 MG/DL    BUN/Creatinine ratio 15 12 - 20      GFR est AA >60 >60 ml/min/1.22m    GFR est non-AA >60 >60 ml/min/1.727m   Calcium 9.7 8.5 - 10.1 MG/DL    Bilirubin, total 0.5 0.2 - 1.0 MG/DL    ALT (SGPT) 19 13 - 56 U/L    AST (SGOT) 15 15 - 37 U/L    Alk. phosphatase 62 45 - 117 U/L    Protein, total 7.6 6.4 - 8.2 g/dL    Albumin 3.9 3.4 - 5.0 g/dL    Globulin 3.7 2.0 - 4.0 g/dL    A-G Ratio 1.1 0.8 - 1.7     LIPASE    Collection Time: 07/22/16  3:00 PM   Result Value Ref Range    Lipase 188 73 - 393 U/L   CARDIAC PANEL,(CK, CKMB & TROPONIN)    Collection Time: 07/22/16  3:00 PM   Result Value Ref Range    CK 59 26 - 192 U/L    CK - MB <1.0 <3.6 ng/ml    CK-MB Index  0.0 - 4.0 %     CALCULATION NOT PERFORMED WHEN RESULT IS BELOW LINEAR LIMIT     Troponin-I, Qt. <0.02 0.00 - 0.06 NG/ML   EKG, 12 LEAD, INITIAL    Collection Time: 07/22/16  3:03 PM   Result Value  Ref Range    Ventricular Rate 76 BPM    Atrial Rate 76 BPM    P-R Interval 116 ms    QRS Duration 84 ms    Q-T Interval 410 ms    QTC Calculation (Bezet) 461 ms    Calculated P Axis 51 degrees    Calculated R Axis 3 degrees    Calculated T Axis 71 degrees    Diagnosis       Normal sinus rhythm  Low voltage QRS  Nonspecific T wave abnormality  Abnormal ECG  When compared with ECG of 08-Jan-2015 08:30,  No significant change was found     URINALYSIS W/ RFLX MICROSCOPIC    Collection Time: 07/22/16  3:05 PM   Result Value Ref Range    Color YELLOW      Appearance CLEAR      Specific gravity 1.018 1.005 - 1.030      pH (UA) 5.0 5.0 - 8.0      Protein NEGATIVE  NEG mg/dL    Glucose NEGATIVE  NEG mg/dL    Ketone NEGATIVE  NEG mg/dL    Bilirubin NEGATIVE  NEG      Blood NEGATIVE  NEG      Urobilinogen 0.2 0.2 - 1.0 EU/dL    Nitrites NEGATIVE  NEG      Leukocyte Esterase NEGATIVE  NEG         Radiologic Studies -   XR HIP RT W OR WO PELV 2-3 VWS   Final Result      XR CHEST PA LAT   Final Result        CT Results  (Last 48 hours)    None        CXR Results  (Last 48 hours)               07/22/16 1614  XR CHEST PA LAT Final result    Impression:  IMPRESSION:       No acute cardiopulmonary process.       Thank you for your referral.       Narrative:  Chest PA and lateral views       CPT CODE: [id number]       HISTORY:Right-sided abdominal pain with breathing        COMPARISON: None.       FINDINGS: The heart is normal in size. The lungs are clear.  The bilateral   costophrenic angles are sharp. The mediastinum, pulmonary vascularity and bony   thorax appear unremarkable.                    Medical Decision Making   I am the first provider for this patient.    I reviewed the vital signs, available nursing notes, past medical history, past surgical history, family history and social history.     Vital Signs-Reviewed the patient's vital signs.      Pulse Oximetry Analysis - 98% on room air    EKG:  Interpreted by the EP. Dr. Izell Carolina   Time Interpreted: 1503   Rate: 76   Rhythm: sinus rhythm   Interpretation: Normal sinus rhythm, nonspecific t wave abnormality    Comparison: no change from prior EKG on 01/08/2015    Records Reviewed: Nursing Notes    Procedures:  Procedures    Provider Notes (Medical Decision Making):     DDX:  UTI, pyelonephritis, kidney stone, right hip pain, arthritis, or other process    66 YO  F presents with 3 day history of right hip, right flank, and pain under right rib. Patient denies chest pain, SOB, nausea, abdominal pain, fever, or chills. On exam patient is tender to palpation over right lateral hip. No CVA tenderness, nontender in RUQ, right rip, and abdomen. Will drawn CBC, BMP, UA.    4:00 PM:  Patient's lab work unremarkable including cardiac enzymes and UA. Will order xray of chest to evalauate right rib pain and xray of right hip.     4:27 PM: Right hip shows changes consistent with arthritis, no acute findings seen. Chest xray shows no acute cardiopulmonary process. Awaiting radiology read on both. Will advise patient to take tylenol as needed for pain. Follow up with PCP for further management of hip pain. Advised patient to return to ED for worsening or change in symptoms or concerns.     MED RECONCILIATION:  Current Facility-Administered Medications   Medication Dose Route Frequency   ??? acetaminophen (TYLENOL) tablet 650 mg  650 mg Oral NOW     Current Outpatient Prescriptions   Medication Sig   ??? insulin glargine (TOUJEO SOLOSTAR U-300 INSULIN) 300 unit/mL (1.5 mL) inpn 40 Units by SubCUTAneous route daily.   ??? acetaminophen (TYLENOL) 325 mg tablet Take 2 Tabs by mouth every four (4) hours as needed for Pain.   ??? ergocalciferol (ERGOCALCIFEROL) 50,000 unit capsule Take 1 Cap by mouth every seven (7) days.    ??? atorvastatin (LIPITOR) 20 mg tablet TAKE ONE TABLET BY MOUTH ONE TIME DAILY   ??? lisinopril (PRINIVIL, ZESTRIL) 10 mg tablet TAKE 1 TABLET BY MOUTH DAILY   ??? nystatin (MYCOSTATIN) topical cream Apply  to affected area two (2) times a day.   ??? glipiZIDE (GLUCOTROL) 5 mg tablet TAKE ONE TABLET BY MOUTH TWICE DAILY   ??? albuterol (VENTOLIN HFA) 90 mcg/actuation inhaler Take 2 Puffs by inhalation every six (6) hours as needed for Wheezing.   ??? budesonide-formoterol (SYMBICORT) 80-4.5 mcg/actuation HFAA inhaler Take 2 Puffs by inhalation two (2) times a day.   ??? metFORMIN (GLUCOPHAGE) 1,000 mg tablet Take 1 Tab by mouth two (2) times daily (with meals).   ??? fluticasone (FLONASE) 50 mcg/actuation nasal spray 2 Sprays by Both Nostrils route daily.   ??? glucose blood VI test strips (ONETOUCH VERIO) strip Check blood sugar daily. DX E11.9   ??? lancets (ONE TOUCH DELICA) 33 gauge misc Use daily to test blood sugar    E11.9   ??? Lancing Device with Lancets (ONE TOUCH DELICA) kit Test blood sugar daily DX E11.9   ??? alcohol swabs padm Use daily to test blood sugar  DX E11.9   ??? glucose blood VI test strips (ADVOCATE REDI-CODE+) strip Check blood sugar daily before breakfast   DX E11.9   ??? ULTRA THIN LANCETS 31 gauge misc        Disposition: d/c home    DISCHARGE NOTE:   Pt has been reexamined. Patient has no new complaints, changes, or physical findings.  Care plan outlined and precautions discussed.  Results of labs and xray were reviewed with the patient. All medications were reviewed with the patient; will d/c home with tylenol. All of pt's questions and concerns were addressed. Patient was instructed and agrees to follow up with pcp, as well as to return to the ED upon further deterioration. Patient is ready to go home.    Follow-up Information     Follow up With Details Comments [place], MD  Call in 1 day for follow up appointment.  [place] [address]  [phone]       [facility]  As needed, If symptoms worsen [address]  [place] [address]  [phone]          Current Discharge Medication List      START taking these medications    Details   acetaminophen (TYLENOL) 325 mg tablet Take 2 Tabs by mouth every four (4) hours as needed for Pain.  Qty: 20 Tab, Refills: 0               Diagnosis     Clinical Impression:   1. Right hip pain    2. Rib pain on right side

## 2016-07-22 NOTE — ED Triage Notes (Addendum)
States right side abdominal pain from beneath right breast radiating downward into right hip area since yesterday.  Denies vomiting.

## 2016-07-22 NOTE — ED Notes (Signed)
Current Discharge Medication List      START taking these medications    Details   acetaminophen (TYLENOL) 325 mg tablet Take 2 Tabs by mouth every four (4) hours as needed for Pain.  Qty: 20 Tab, Refills: 0           Patient armband removed and shredded  Prescription given and reviewed with patient.

## 2016-07-22 NOTE — ED Triage Notes (Signed)
Cheryl Paul is a 66 year old female arrived to ER c/o right rib, RLQ abdominal pain, and right hip pain.  Patient states, "I mainly feel the pain in my right rib and it goes to my belly."  Patient reports she has been applying Lidocaine cream with no relief.  Denies fever, chills, nausea, vomiting, diarrhea, constipation, melena.  Denies any recent travels. Denies CP, SOB, flank pain, urinary sx's, or any other concerns.     I performed a brief evaluation, including history and physical, of the patient here in triage and I have determined that pt will need further treatment and evaluation from the main side ER physician.  I have placed initial orders to help in expediting patients care.     July 22, 2016 at 2:17 PM - Charlott RakesYolanda Binnie Droessler, NP

## 2016-08-13 ENCOUNTER — Encounter

## 2016-08-18 ENCOUNTER — Ambulatory Visit: Primary: Family Medicine

## 2016-08-31 ENCOUNTER — Inpatient Hospital Stay
Admit: 2016-08-31 | Discharge: 2016-08-31 | Disposition: A | Discharge status: Home / Self Care | Payer: BLUE CROSS/BLUE SHIELD | Attending: Emergency Medicine

## 2016-08-31 ENCOUNTER — Emergency Department: Admit: 2016-08-31 | Payer: BLUE CROSS/BLUE SHIELD | Primary: Family Medicine

## 2016-08-31 DIAGNOSIS — J449 Chronic obstructive pulmonary disease, unspecified: Secondary | ICD-10-CM

## 2016-08-31 MED ORDER — FLUTICASONE 50 MCG/ACTUATION NASAL SPRAY, SUSP
50 mcg/actuation | Freq: Every day | NASAL | 0 refills | Status: DC
Start: 2016-08-31 — End: 2017-08-20

## 2016-08-31 MED ORDER — PREDNISONE 20 MG TAB
20 mg | ORAL | Status: AC
Start: 2016-08-31 — End: 2016-08-31
  Administered 2016-08-31: 14:00:00 via ORAL

## 2016-08-31 MED ORDER — IPRATROPIUM-ALBUTEROL 2.5 MG-0.5 MG/3 ML NEB SOLUTION
2.5 mg-0.5 mg/3 ml | RESPIRATORY_TRACT | Status: AC
Start: 2016-08-31 — End: 2016-08-31
  Administered 2016-08-31: 14:00:00 via RESPIRATORY_TRACT

## 2016-08-31 MED ORDER — PREDNISONE 10 MG TABLETS IN A DOSE PACK
10 mg | ORAL_TABLET | ORAL | 0 refills | Status: DC
Start: 2016-08-31 — End: 2017-09-24

## 2016-08-31 MED ORDER — CETIRIZINE 10 MG TAB
10 mg | ORAL_TABLET | Freq: Every day | ORAL | 0 refills | Status: AC
Start: 2016-08-31 — End: 2016-09-30

## 2016-08-31 MED FILL — IPRATROPIUM-ALBUTEROL 2.5 MG-0.5 MG/3 ML NEB SOLUTION: 2.5 mg-0.5 mg/3 ml | RESPIRATORY_TRACT | Qty: 3

## 2016-08-31 MED FILL — PREDNISONE 20 MG TAB: 20 mg | ORAL | Qty: 3

## 2016-08-31 NOTE — ED Notes (Signed)
I have reviewed discharge instructions with the patient.  The patient verbalized understanding.

## 2016-08-31 NOTE — ED Provider Notes (Signed)
EMERGENCY DEPARTMENT HISTORY AND PHYSICAL EXAM    Date: 08/31/2016  Patient Name: Cheryl Paul    History of Presenting Illness     Chief Complaint   Patient presents with   ??? Shortness of Breath         History Provided By: Patient    Chief Complaint: Shortness of breath   Duration: 2 days   Timing:  Acute  Location: Chest   Quality: Tightness  Severity: Mild  Modifying Factors: None   Associated Symptoms: none       Additional History (Context): Cheryl Paul is a 66 y.o. female with a history of asthma, DM, HTN, arthritis, hypercholesterolemia, and COPD who presents with a 2 day history of shortness of breath, wheezing, pruritic/watery eyes. She states she has been using her rescue inhaler at home without relief. She states she should be on simbcort but is unable to afford it. Is not on any allergy medication, states the wheezing get worse when the pollen is out and seasons change. Has not called her PCP. Reports a dry cough for the past two days. Is not and has never been a smoker. Denies chest pain. Pt denies any fevers or chills, headache, dizziness or light headedness, ENT issues, CP or discomfort, n/v/d/c, abd pain, back pain, diaphoresis, melena/hematochezia, dysuria, hematuria, frequency, focal weakness/numbness/tingling, or rash.  Patient has no other complaints at this time.       PCP: Gwyneth Revels, MD    Current Outpatient Prescriptions   Medication Sig Dispense Refill   ??? insulin glargine (TOUJEO SOLOSTAR U-300 INSULIN) 300 unit/mL (1.5 mL) inpn 40 Units by SubCUTAneous route daily.     ??? acetaminophen (TYLENOL) 325 mg tablet Take 2 Tabs by mouth every four (4) hours as needed for Pain. 20 Tab 0   ??? ergocalciferol (ERGOCALCIFEROL) 50,000 unit capsule Take 1 Cap by mouth every seven (7) days. 8 Cap 0   ??? glucose blood VI test strips (ONETOUCH VERIO) strip Check blood sugar daily. DX E11.9 100 Strip 3   ??? atorvastatin (LIPITOR) 20 mg tablet TAKE ONE TABLET BY MOUTH ONE TIME DAILY 90 Tab 2    ??? lisinopril (PRINIVIL, ZESTRIL) 10 mg tablet TAKE 1 TABLET BY MOUTH DAILY 90 Tab 1   ??? nystatin (MYCOSTATIN) topical cream Apply  to affected area two (2) times a day. 15 g 1   ??? glipiZIDE (GLUCOTROL) 5 mg tablet TAKE ONE TABLET BY MOUTH TWICE DAILY 180 Tab 1   ??? lancets (ONE TOUCH DELICA) 33 gauge misc Use daily to test blood sugar    E11.9 100 Lancet 3   ??? albuterol (VENTOLIN HFA) 90 mcg/actuation inhaler Take 2 Puffs by inhalation every six (6) hours as needed for Wheezing. 3 Inhaler 0   ??? budesonide-formoterol (SYMBICORT) 80-4.5 mcg/actuation HFAA inhaler Take 2 Puffs by inhalation two (2) times a day. 3 Inhaler 1   ??? metFORMIN (GLUCOPHAGE) 1,000 mg tablet Take 1 Tab by mouth two (2) times daily (with meals). 180 Tab 1   ??? Lancing Device with Lancets (ONE TOUCH DELICA) kit Test blood sugar daily DX E11.9 1 Kit 0   ??? alcohol swabs padm Use daily to test blood sugar  DX E11.9 100 Pad 1   ??? fluticasone (FLONASE) 50 mcg/actuation nasal spray 2 Sprays by Both Nostrils route daily. 1 Bottle 0   ??? glucose blood VI test strips (ADVOCATE REDI-CODE+) strip Check blood sugar daily before breakfast   DX E11.9 50 Strip 3   ???  ULTRA THIN LANCETS 31 gauge misc          Past History     Past Medical History:  Past Medical History:   Diagnosis Date   ??? Arthritis     knee, arms,    ??? Asthma    ??? Chronic obstructive pulmonary disease ([place])    ??? Diabetes ([place])    ??? H/O sinusitis    ??? Hypercholesterolemia    ??? Hypertension    ??? Toe fracture, left August 2015    4th toe       Past Surgical History:  Past Surgical History:   Procedure Laterality Date   ??? ABDOMEN SURGERY PROC UNLISTED      4 surguries for blockages   ??? HX CARPAL TUNNEL RELEASE Bilateral    ??? HX CHOLECYSTECTOMY     ??? HX GYN     ??? HX HERNIA REPAIR      abdomen   ??? HX HYSTERECTOMY      tubes tied   ??? HX POLYPECTOMY  04/18/14       Family History:  Family History   Problem Relation Age of Onset   ??? Hypertension Mother    ??? Stroke Mother    ??? Diabetes Mother     ??? Thyroid Disease Mother    ??? Arthritis-rheumatoid Mother    ??? Cancer Father      colon polpe   ??? Cancer Maternal Aunt        Social History:  Social History   Substance Use Topics   ??? Smoking status: Never Smoker   ??? Smokeless tobacco: Never Used   ??? Alcohol use No       Allergies:  Allergies   Allergen Reactions   ??? Penicillins Itching   ??? Butrans [Buprenorphine] Rash   ??? Iodine Rash   ??? Lactose Diarrhea     Lactose intolerance         Review of Systems   Review of Systems   Constitutional: Negative for chills and fever.   HENT: Negative for congestion, rhinorrhea and sore throat.    Eyes: Positive for discharge and itching.   Respiratory: Positive for cough, shortness of breath and wheezing.    Cardiovascular: Negative for chest pain, palpitations and leg swelling.   Gastrointestinal: Negative for abdominal pain, blood in stool, constipation, diarrhea, nausea and vomiting.   Genitourinary: Negative for dysuria, frequency and hematuria.   Musculoskeletal: Negative for back pain and myalgias.   Skin: Negative for rash and wound.   Neurological: Negative for dizziness and headaches.   All other systems reviewed and are negative.    All Other Systems Negative  Physical Exam     Vitals:    08/31/16 1007 08/31/16 1010 08/31/16 1015 08/31/16 1030   BP: 154/71 154/71  (!) 164/93   Pulse:  77     Resp:  20     Temp:  98.4 ??F (36.9 ??C)     SpO2: 97% 100% 100% 99%   Weight:  127 kg (280 lb)     Height:  5' 3"  (1.6 m)       Physical Exam   Constitutional: She is oriented to person, place, and time. She appears well-developed and well-nourished. No distress.   HENT:   Head: Normocephalic and atraumatic.   Eyes: Conjunctivae are normal.   Neck: Normal range of motion. Neck supple.   Cardiovascular: Normal rate, regular rhythm and normal heart sounds.    Pulmonary/Chest: Effort normal. No  accessory muscle usage. No respiratory distress. She has wheezes. She has no rhonchi. She has no rales. She exhibits no tenderness.    Abdominal: Soft. Bowel sounds are normal. She exhibits no distension. There is no tenderness. There is no rebound and no guarding.   Musculoskeletal: She exhibits no edema or deformity.   Neurological: She is alert and oriented to person, place, and time. She has normal reflexes.   Skin: Skin is warm and dry. She is not diaphoretic.   Psychiatric: She has a normal mood and affect.   Nursing note and vitals reviewed.        Diagnostic Study Results     Labs -   No results found for this or any previous visit (from the past 12 hour(s)).    Radiologic Studies -   XR CHEST PA LAT   Final Result        CT Results  (Last 48 hours)    None        CXR Results  (Last 48 hours)               08/31/16 1030  XR CHEST PA LAT Final result    Impression:  IMPRESSION:       Borderline cardiomegaly.       Minimal pulmonary vascular prominence, without pulmonary edema.       At the base of left lung there are mild atelectatic changes, and central   fibrotic changes, with or without subtle infiltrates. No pleural effusion.       Narrative:  Chest x-rays, PA and lateral views:               INDICATION:       Chest congestion and cough for 2 days.       Shortness of breath.       History of asthma, diabetes, hypertension and COPD.       COMPARISON STUDY: Chest x-ray on 07/22/2016, 07/29/2007.       FINDINGS:       Cardiac size is borderline with a cardiothoracic ratio of 15.2/29.1. Pulmonary   vascularity minimally prominent but not cephalized.       At the basal left lung there are mild hazy and patchy opacities, suggestive of   minimal fibrotic or atelectatic changes, although subtle infiltrates may not be   excluded.       Rest the lungs is are clear. Lungs are not hyperinflated.       No evidence of pneumothorax or pleural effusion.       In thoracic spine there are findings of mild-to-moderate multilevel spondylosis.                           Medical Decision Making   I am the first provider for this patient.     I reviewed the vital signs, available nursing notes, past medical history, past surgical history, family history and social history.    Vital Signs-Reviewed the patient's vital signs.      Pulse Oximetry Analysis -  100 % RA    Records Reviewed: Nursing Notes and Old Medical Records    Procedures:  Procedures none     Provider Notes (Medical Decision Making):     Differential Diagnosis:  influenza, mononucleosis, acute bronchitis, URI, streptococcal pharyngitis, pertussis, pneumonia, asthma exacerbation, allergic rhinitis      Plan: Will order chest xray, dose of steroids, and duo neb.  11:50 AM  Patient states she is feeling much better and is ready to go home.       MED RECONCILIATION:  No current facility-administered medications for this encounter.      Current Outpatient Prescriptions   Medication Sig   ??? insulin glargine (TOUJEO SOLOSTAR U-300 INSULIN) 300 unit/mL (1.5 mL) inpn 40 Units by SubCUTAneous route daily.   ??? acetaminophen (TYLENOL) 325 mg tablet Take 2 Tabs by mouth every four (4) hours as needed for Pain.   ??? ergocalciferol (ERGOCALCIFEROL) 50,000 unit capsule Take 1 Cap by mouth every seven (7) days.   ??? glucose blood VI test strips (ONETOUCH VERIO) strip Check blood sugar daily. DX E11.9   ??? atorvastatin (LIPITOR) 20 mg tablet TAKE ONE TABLET BY MOUTH ONE TIME DAILY   ??? lisinopril (PRINIVIL, ZESTRIL) 10 mg tablet TAKE 1 TABLET BY MOUTH DAILY   ??? nystatin (MYCOSTATIN) topical cream Apply  to affected area two (2) times a day.   ??? glipiZIDE (GLUCOTROL) 5 mg tablet TAKE ONE TABLET BY MOUTH TWICE DAILY   ??? lancets (ONE TOUCH DELICA) 33 gauge misc Use daily to test blood sugar    E11.9   ??? albuterol (VENTOLIN HFA) 90 mcg/actuation inhaler Take 2 Puffs by inhalation every six (6) hours as needed for Wheezing.   ??? budesonide-formoterol (SYMBICORT) 80-4.5 mcg/actuation HFAA inhaler Take 2 Puffs by inhalation two (2) times a day.    ??? metFORMIN (GLUCOPHAGE) 1,000 mg tablet Take 1 Tab by mouth two (2) times daily (with meals).   ??? Lancing Device with Lancets (ONE TOUCH DELICA) kit Test blood sugar daily DX E11.9   ??? alcohol swabs padm Use daily to test blood sugar  DX E11.9   ??? fluticasone (FLONASE) 50 mcg/actuation nasal spray 2 Sprays by Both Nostrils route daily.   ??? glucose blood VI test strips (ADVOCATE REDI-CODE+) strip Check blood sugar daily before breakfast   DX E11.9   ??? ULTRA THIN LANCETS 31 gauge misc        Disposition:  Home     DISCHARGE NOTE:   Pt has been reexamined. Patient has no new complaints, changes, or physical findings.  Care plan outlined and precautions discussed.  Results of chest xray and breathing treatment  were reviewed with the patient. All medications were reviewed with the patient; will d/c home with steriods, zyrtec and Flonase . All of pt's questions and concerns were addressed. Patient was instructed and agrees to follow up with PCP, as well as to return to the ED upon further deterioration. Patient is ready to go home.    Follow-up Information     Follow up With Details Comments Contact Info    [facility]  As needed [place] [id number]  [phone]    Gwyneth Revels, MD In 2 days  [address]  [place] [address]  [phone]            Current Discharge Medication List      START taking these medications    Details   cetirizine (ZYRTEC) 10 mg tablet Take 1 Tab by mouth daily for 30 days.  Qty: 30 Tab, Refills: 0      !! fluticasone (FLONASE) 50 mcg/actuation nasal spray 2 Sprays by Both Nostrils route daily.  Qty: 1 Bottle, Refills: 0      predniSONE (STERAPRED DS) 10 mg dose pack Take 2 Tabs by mouth See Admin Instructions.  Qty: 48  Tab, Refills: 0       !! - Potential duplicate medications found. Please discuss with provider.      CONTINUE these medications which have NOT CHANGED    Details    !! fluticasone (FLONASE) 50 mcg/actuation nasal spray 2 Sprays by Both Nostrils route daily.  Qty: 1 Bottle, Refills: 0       !! - Potential duplicate medications found. Please discuss with provider.          Diagnosis     Clinical Impression:   1. SOB (shortness of breath)    2. Mild asthma with acute exacerbation, unspecified whether persistent

## 2016-08-31 NOTE — ED Triage Notes (Signed)
Pt reports increased SOB and coughing for several days.  Reports hx COPD.  Reports using inhalers with minimal relief.

## 2016-11-19 ENCOUNTER — Other Ambulatory Visit: Payer: Self-pay | Admitting: Student

## 2016-11-19 DIAGNOSIS — R1031 Right lower quadrant pain: Secondary | ICD-10-CM

## 2016-11-19 DIAGNOSIS — R630 Anorexia: Secondary | ICD-10-CM

## 2016-11-19 DIAGNOSIS — R1032 Left lower quadrant pain: Secondary | ICD-10-CM

## 2016-11-19 DIAGNOSIS — R1013 Epigastric pain: Secondary | ICD-10-CM

## 2016-11-19 DIAGNOSIS — R112 Nausea with vomiting, unspecified: Secondary | ICD-10-CM

## 2016-11-23 ENCOUNTER — Other Ambulatory Visit
Admission: RE | Admit: 2016-11-23 | Discharge: 2016-11-23 | Disposition: A | Discharge status: Home / Self Care | Payer: Medicare Other | Source: Ambulatory Visit | Attending: Student | Admitting: Student

## 2016-11-23 ENCOUNTER — Ambulatory Visit
Admission: RE | Admit: 2016-11-23 | Discharge: 2016-11-23 | Disposition: A | Discharge status: Home / Self Care | Payer: Medicare Other | Source: Ambulatory Visit | Attending: Student | Admitting: Student

## 2016-11-23 DIAGNOSIS — R630 Anorexia: Secondary | ICD-10-CM | POA: Diagnosis present

## 2016-11-23 DIAGNOSIS — N281 Cyst of kidney, acquired: Secondary | ICD-10-CM | POA: Insufficient documentation

## 2016-11-23 DIAGNOSIS — N200 Calculus of kidney: Secondary | ICD-10-CM | POA: Diagnosis not present

## 2016-11-23 DIAGNOSIS — R1013 Epigastric pain: Secondary | ICD-10-CM | POA: Insufficient documentation

## 2016-11-23 DIAGNOSIS — Z9049 Acquired absence of other specified parts of digestive tract: Secondary | ICD-10-CM | POA: Insufficient documentation

## 2016-11-23 DIAGNOSIS — I700 Atherosclerosis of aorta: Secondary | ICD-10-CM | POA: Insufficient documentation

## 2016-11-23 DIAGNOSIS — R197 Diarrhea, unspecified: Secondary | ICD-10-CM | POA: Diagnosis present

## 2016-11-23 DIAGNOSIS — K760 Fatty (change of) liver, not elsewhere classified: Secondary | ICD-10-CM | POA: Diagnosis not present

## 2016-11-23 DIAGNOSIS — R1032 Left lower quadrant pain: Secondary | ICD-10-CM | POA: Diagnosis not present

## 2016-11-23 DIAGNOSIS — R112 Nausea with vomiting, unspecified: Secondary | ICD-10-CM | POA: Diagnosis present

## 2016-11-23 DIAGNOSIS — R1031 Right lower quadrant pain: Secondary | ICD-10-CM | POA: Insufficient documentation

## 2016-11-23 LAB — GASTROINTESTINAL PANEL BY PCR, STOOL (REPLACES STOOL CULTURE)

## 2016-11-23 MED ORDER — IOPAMIDOL (ISOVUE-300) INJECTION 61%
100.0000 mL | Freq: Once | INTRAVENOUS | Status: AC | PRN
  Administered 2016-11-23: 100 mL via INTRAVENOUS

## 2016-11-24 LAB — MISC LABCORP TEST (SEND OUT): Labcorp test code: 86207

## 2016-11-24 LAB — H. PYLORI ANTIGEN, STOOL: H. Pylori Stool Ag, Eia: NEGATIVE

## 2016-11-27 LAB — PANCREATIC ELASTASE, FECAL: Pancreatic Elastase-1, Stool: >500 ug Elast./g (ref >200)

## 2017-03-11 ENCOUNTER — Encounter

## 2017-04-12 ENCOUNTER — Ambulatory Visit

## 2017-04-12 ENCOUNTER — Inpatient Hospital Stay: Admit: 2017-04-12 | Payer: MEDICARE | Primary: Family Medicine

## 2017-04-12 DIAGNOSIS — Z1231 Encounter for screening mammogram for malignant neoplasm of breast: Secondary | ICD-10-CM

## 2017-05-24 ENCOUNTER — Inpatient Hospital Stay
Admit: 2017-05-24 | Discharge: 2017-05-24 | Disposition: A | Discharge status: Home / Self Care | Payer: MEDICARE | Attending: Emergency Medicine

## 2017-05-24 ENCOUNTER — Emergency Department: Admit: 2017-05-24 | Payer: MEDICARE | Primary: Family Medicine

## 2017-05-24 DIAGNOSIS — R079 Chest pain, unspecified: Secondary | ICD-10-CM

## 2017-05-24 LAB — D-DIMER, QUANTITATIVE: D-Dimer, Quant: 0.5 ug/ml(FEU) — ABNORMAL HIGH (ref <0.46)

## 2017-05-24 LAB — METABOLIC PANEL, BASIC
Anion gap: 11 mmol/L (ref 3.0–18)
BUN/Creatinine ratio: 16 (ref 12–20)
BUN: 15 MG/DL (ref 7.0–18)
CO2: 26 mmol/L (ref 21–32)
Calcium: 8.7 MG/DL (ref 8.5–10.1)
Chloride: 99 mmol/L — ABNORMAL LOW (ref 100–108)
Creatinine: 0.93 MG/DL (ref 0.6–1.3)
GFR est AA: >60 mL/min/{1.73_m2} (ref >60)
GFR est non-AA: >60 mL/min/{1.73_m2} (ref >60)
Glucose: 329 mg/dL — ABNORMAL HIGH (ref 74–99)
Potassium: 4.1 mmol/L (ref 3.5–5.5)
Sodium: 136 mmol/L (ref 136–145)

## 2017-05-24 LAB — CBC WITH AUTOMATED DIFF
ABS. BASOPHILS: 0 10*3/uL (ref 0.0–0.1)
ABS. EOSINOPHILS: 0.2 10*3/uL (ref 0.0–0.4)
ABS. LYMPHOCYTES: 2.9 10*3/uL (ref 0.9–3.6)
ABS. MONOCYTES: 0.6 10*3/uL (ref 0.05–1.2)
ABS. NEUTROPHILS: 5.4 10*3/uL (ref 1.8–8.0)
BASOPHILS: 0 % (ref 0–2)
EOSINOPHILS: 2 % (ref 0–5)
HCT: 42.4 % (ref 35.0–45.0)
HGB: 13.5 g/dL (ref 12.0–16.0)
LYMPHOCYTES: 32 % (ref 21–52)
MCH: 27.4 PG (ref 24.0–34.0)
MCHC: 31.8 g/dL (ref 31.0–37.0)
MCV: 86 FL (ref 74.0–97.0)
MONOCYTES: 6 % (ref 3–10)
MPV: 12.8 FL — ABNORMAL HIGH (ref 9.2–11.8)
NEUTROPHILS: 60 % (ref 40–73)
PLATELET: 167 10*3/uL (ref 135–420)
RBC: 4.93 M/uL (ref 4.20–5.30)
RDW: 13.9 % (ref 11.6–14.5)
WBC: 9.1 10*3/uL (ref 4.6–13.2)

## 2017-05-24 LAB — PROTHROMBIN TIME + INR
INR: 1 (ref 0.8–1.2)
Prothrombin time: 12.9 s (ref 11.5–15.2)

## 2017-05-24 LAB — CARDIAC PANEL,(CK, CKMB & TROPONIN)
CK - MB: <1 ng/ml (ref <3.6)
CK: 77 U/L (ref 26–192)
Troponin-I, QT: <0.02 NG/ML (ref 0.00–0.06)

## 2017-05-24 LAB — TROPONIN I: Troponin-I, QT: <0.02 NG/ML (ref 0.00–0.06)

## 2017-05-24 LAB — D DIMER: D DIMER: 0.5 ug/ml(FEU) — ABNORMAL HIGH (ref <0.46)

## 2017-05-24 LAB — LIPASE: Lipase: 295 U/L (ref 73–393)

## 2017-05-24 LAB — NT-PRO BNP: NT pro-BNP: 16 PG/ML (ref 0–900)

## 2017-05-24 LAB — PTT: aPTT: 24.1 s (ref 23.0–36.4)

## 2017-05-24 MED ORDER — ALBUTEROL SULFATE HFA 90 MCG/ACTUATION AEROSOL INHALER
90 mcg/actuation | RESPIRATORY_TRACT | 0 refills | Status: DC | PRN
Start: 2017-05-24 — End: 2020-10-23

## 2017-05-24 MED ORDER — TRAMADOL 50 MG TAB
50 mg | ORAL_TABLET | Freq: Four times a day (QID) | ORAL | 0 refills | Status: DC | PRN
Start: 2017-05-24 — End: 2017-09-24

## 2017-05-24 NOTE — ED Notes (Signed)
Patient given copy of dc instructions and script(s).  Patient verbalized understanding of instructions and script (s).  Patient given a current medication reconciliation form and verbalized understanding of their medications.   Patient verbalized understanding of the importance of discussing medications with  his or her physician or clinic they will be following up with.  Patient alert and oriented and in no acute distress.  Patient discharged home ambulatory with self.

## 2017-05-24 NOTE — ED Provider Notes (Signed)
Pt c/o chest tightness, w sob, off and no for 2-3 days. w movement, especially sitting up, twisting. Brief episodes . Resolve after movment. No fever.  Mild cough.  Non prod.  Mild sob, episodic, none now. No fever.  No back or abd pain. No leg pain or leg swelling.  H/o copd.  Denies curr wheezing. Says out of alb mdi though, req refill. Non smoker. Says neg cardiac testing, last 3 yrs ago per pt.              Past Medical History:   Diagnosis Date   ??? Arthritis     knee, arms,    ??? Asthma    ??? Chronic obstructive pulmonary disease ([place])    ??? Diabetes ([place])    ??? H/O sinusitis    ??? Hypercholesterolemia    ??? Hypertension    ??? Toe fracture, left August 2015    4th toe       Past Surgical History:   Procedure Laterality Date   ??? ABDOMEN SURGERY PROC UNLISTED      4 surguries for blockages   ??? HX CARPAL TUNNEL RELEASE Bilateral    ??? HX CHOLECYSTECTOMY     ??? HX GYN     ??? HX HERNIA REPAIR      abdomen   ??? HX HYSTERECTOMY      tubes tied   ??? HX POLYPECTOMY  04/18/14         Family History:   Problem Relation Age of Onset   ??? Hypertension Mother    ??? Stroke Mother    ??? Diabetes Mother    ??? Thyroid Disease Mother    ??? Arthritis-rheumatoid Mother    ??? Cancer Father         colon polpe   ??? Cancer Maternal Aunt        Social History     Socioeconomic History   ??? Marital status: SINGLE     Spouse name: Not on file   ??? Number of children: Not on file   ??? Years of education: Not on file   ??? Highest education level: Not on file   Social Needs   ??? Financial resource strain: Not on file   ??? Food insecurity - worry: Not on file   ??? Food insecurity - inability: Not on file   ??? Transportation needs - medical: Not on file   ??? Transportation needs - non-medical: Not on file   Occupational History   ??? Not on file   Tobacco Use   ??? Smoking status: Never Smoker   ??? Smokeless tobacco: Never Used   Substance and Sexual Activity   ??? Alcohol use: No     Alcohol/week: 0.0 oz   ??? Drug use: No   ??? Sexual activity: Not on file    Other Topics Concern   ??? 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 Denies history of smoking          ALLERGIES: Penicillins; Butrans [buprenorphine]; Iodine; and Lactose    Review of Systems   Constitutional: Negative for fever.   HENT: Negative for congestion.    Respiratory: Positive for shortness of breath. Negative for cough.    Cardiovascular: Positive for chest pain.   Gastrointestinal: Negative for abdominal pain and vomiting.   Musculoskeletal: Negative for back pain.   Skin: Negative for rash.   Neurological: Negative for light-headedness.  All other systems reviewed and are negative.      Vitals:    05/24/17 0343 05/24/17 0344 05/24/17 0400 05/24/17 0512   BP:   (!) 152/108 132/64   Pulse:  92 94 84   Resp:  _0 Temp:       SpO2: 96% 100% 100% 99%   Weight:                Physical Exam   Constitutional: She is oriented to person, place, and time. She appears well-developed.   obese   HENT:   Head: Normocephalic.   Mouth/Throat: Oropharynx is clear and moist.   Eyes: Pupils are equal, round, and reactive to light.   Neck: Normal range of motion.   Cardiovascular: Regular rhythm.   No murmur heard.  Pulmonary/Chest: Effort normal and breath sounds normal. She has no wheezes.   Abdominal: Soft. There is no tenderness.   Musculoskeletal: Normal range of motion. She exhibits no tenderness.   Neurological: She is alert and oriented to person, place, and time.   Skin: No rash noted.   Nursing note and vitals reviewed.       MDM       Procedures      Vitals:  Patient Vitals for the past 12 hrs:   Temp Pulse Resp BP SpO2   05/24/17 0512 ??? 84 19 132/64 99 %   05/24/17 0400 ??? 94 21 (!) 152/108 100 %   05/24/17 0344 ??? 92 19 ??? 100 %   05/24/17 0343 ??? ??? ??? ??? 96 %   05/24/17 0341 ??? ??? ??? 130/79 ???   05/24/17 0339 97.6 ??F (36.4 ??C) 92 18 130/79 100 %       Medications ordered:   Medications - No data to display    Lab findings:   Recent Results (from the past 12 hour(s))   EKG, 12 LEAD, INITIAL    Collection Time: 05/24/17  3:39 AM   Result Value Ref Range    Ventricular Rate 91 BPM    Atrial Rate 91 BPM    P-R Interval 132 ms    QRS Duration 86 ms    Q-T Interval 346 ms    QTC Calculation (Bezet) 425 ms    Calculated P Axis 62 degrees    Calculated R Axis 13 degrees    Calculated T Axis 92 degrees    Diagnosis       Normal sinus rhythm  Nonspecific T wave abnormality  Abnormal ECG  When compared with ECG of 22-Jul-2016 15:03,  No significant change was found     CBC WITH AUTOMATED DIFF    Collection Time: 05/24/17  3:47 AM   Result Value Ref Range    WBC 9.1 4.6 - 13.2 K/uL    RBC 4.93 4.20 - 5.30 M/uL    HGB 13.5 12.0 - 16.0 g/dL    HCT 42.4 35.0 - 45.0 %    MCV 86.0 74.0 - 97.0 FL    MCH 27.4 24.0 - 34.0 PG    MCHC 31.8 31.0 - 37.0 g/dL    RDW 13.9 11.6 - 14.5 %    PLATELET 167 135 - 420 K/uL    MPV 12.8 (H) 9.2 - 11.8 FL    NEUTROPHILS 60 40 - 73 %    LYMPHOCYTES 32 21 - 52 %    MONOCYTES 6 3 - 10 %    EOSINOPHILS 2 0 - 5 %  BASOPHILS 0 0 - 2 %    ABS. NEUTROPHILS 5.4 1.8 - 8.0 K/UL    ABS. LYMPHOCYTES 2.9 0.9 - 3.6 K/UL    ABS. MONOCYTES 0.6 0.05 - 1.2 K/UL    ABS. EOSINOPHILS 0.2 0.0 - 0.4 K/UL    ABS. BASOPHILS 0.0 0.0 - 0.1 K/UL    DF AUTOMATED     METABOLIC PANEL, BASIC    Collection Time: 05/24/17  3:47 AM   Result Value Ref Range    Sodium 136 136 - 145 mmol/L    Potassium 4.1 3.5 - 5.5 mmol/L    Chloride 99 (L) 100 - 108 mmol/L    CO2 26 21 - 32 mmol/L    Anion gap 11 3.0 - 18 mmol/L    Glucose 329 (H) 74 - 99 mg/dL    BUN 15 7.0 - 18 MG/DL    Creatinine 0.93 0.6 - 1.3 MG/DL    BUN/Creatinine ratio 16 12 - 20      GFR est AA >60 >60 ml/min/1.25m    GFR est non-AA >60 >60 ml/min/1.788m   Calcium 8.7 8.5 - 10.1 MG/DL   CARDIAC PANEL,(CK, CKMB & TROPONIN)    Collection Time: 05/24/17  3:47 AM   Result Value Ref Range    CK 77 26 - 192 U/L    CK - MB <1.0 <3.6 ng/ml    CK-MB Index  0.0 - 4.0 %      CALCULATION NOT PERFORMED WHEN RESULT IS BELOW LINEAR LIMIT    Troponin-I, QT <0.02 0.00 - 0.06 NG/ML   PROTHROMBIN TIME + INR    Collection Time: 05/24/17  3:47 AM   Result Value Ref Range    Prothrombin time 12.9 11.5 - 15.2 sec    INR 1.0 0.8 - 1.2     PTT    Collection Time: 05/24/17  3:47 AM   Result Value Ref Range    aPTT 24.1 23.0 - 36.4 SEC   LIPASE    Collection Time: 05/24/17  3:47 AM   Result Value Ref Range    Lipase 295 73 - 393 U/L   NT-PRO BNP    Collection Time: 05/24/17  3:47 AM   Result Value Ref Range    NT pro-BNP 16 0 - 900 PG/ML   D DIMER    Collection Time: 05/24/17  3:47 AM   Result Value Ref Range    D DIMER 0.50 (H) <0.46 ug/ml(FEU)   TROPONIN I    Collection Time: 05/24/17  5:40 AM   Result Value Ref Range    Troponin-I, QT <0.02 0.00 - 0.06 NG/ML         X-Ray, CT or other radiology findings or impressions:  XR CHEST PORT   Final Result   IMPRESSION::      1.  No acute cardiopulmonary disease.          Progress notes, Consult notes or additional Procedure notes:   6:26 AM pt says pain w movement, mild.  req pain rx, says tol ultram well in past for sim  Pain.  Neg ck/trop, trop x 2.  ekg w nad. Neg d-dimer.  cxr neg . Not c/w dissection/cad/pe/ptx/pna.  Pt req dc. Stable for dc and close f/u.  Detailed ret inst given. To dc per pt req. Verbalizes understanding of detailed ret inst given. No emc.   elev gluc but not c/w dka.     Diagnosis:   1. Chest pain, unspecified type  2. Hyperglycemia    3. Medication refill        Disposition: home    Follow-up Information     Follow up With Specialties Details Why Contact Info    Gwyneth Revels, MD Family Practice Schedule an appointment as soon as possible for a visit in 1 day  [address]  [place] [address]  [phone]                Medication List      START taking these medications    traMADol 50 mg tablet  Commonly known as:  ULTRAM  Take 1 Tab by mouth every six (6) hours as needed for Pain. Max Daily  Amount: 200 mg.        CHANGE how you take these medications    albuterol 90 mcg/actuation inhaler  Commonly known as:  PROVENTIL HFA, VENTOLIN HFA, PROAIR HFA  Take 1-2 Puffs by inhalation every four (4) hours as needed for Wheezing.  What changed:    ?? how much to take  ?? when to take this        ASK your doctor about these medications    acetaminophen 325 mg tablet  Commonly known as:  TYLENOL  Take 2 Tabs by mouth every four (4) hours as needed for Pain.     alcohol swabs Padm  Use daily to test blood sugar  DX E11.9     atorvastatin 20 mg tablet  Commonly known as:  LIPITOR  TAKE ONE TABLET BY MOUTH ONE TIME DAILY     budesonide-formoterol 80-4.5 mcg/actuation Hfaa  Commonly known as:  SYMBICORT  Take 2 Puffs by inhalation two (2) times a day.     ergocalciferol 50,000 unit capsule  Commonly known as:  ERGOCALCIFEROL  Take 1 Cap by mouth every seven (7) days.     * fluticasone 50 mcg/actuation nasal spray  Commonly known as:  FLONASE  2 Sprays by Both Nostrils route daily.     * fluticasone 50 mcg/actuation nasal spray  Commonly known as:  FLONASE  2 Sprays by Both Nostrils route daily.     glipiZIDE 5 mg tablet  Commonly known as:  GLUCOTROL  TAKE ONE TABLET BY MOUTH TWICE DAILY     * glucose blood VI test strips strip  Commonly known as:  ADVOCATE REDI-CODE PLUS  Check blood sugar daily before breakfast   DX E11.9     * glucose blood VI test strips strip  Commonly known as:  ONETOUCH VERIO  Check blood sugar daily. DX E11.9     Lancing Device with Lancets Kit  Commonly known as:  One Touch Delica  Test blood sugar daily DX E11.9     lisinopril 10 mg tablet  Commonly known as:  PRINIVIL, ZESTRIL  TAKE 1 TABLET BY MOUTH DAILY     metFORMIN 1,000 mg tablet  Commonly known as:  GLUCOPHAGE  Take 1 Tab by mouth two (2) times daily (with meals).     nystatin topical cream  Commonly known as:  MYCOSTATIN  Apply  to affected area two (2) times a day.     predniSONE 10 mg dose pack  Commonly known as:  STERAPRED DS   Take 2 Tabs by mouth See Admin Instructions.     TOUJEO SOLOSTAR U-300 INSULIN 300 unit/mL (1.5 mL) Inpn  Generic drug:  insulin glargine U-300 conc     * ULTRA THIN LANCETS 31 gauge Misc  Generic  drug:  lancets     * lancets 33 gauge Misc  Commonly known as:  One Touch Delica  Use daily to test blood sugar    E11.9         * This list has 6 medication(s) that are the same as other medications prescribed for you. Read the directions carefully, and ask your doctor or other care provider to review them with you.               Where to Get Your Medications      Information about where to get these medications is not yet available    Ask your nurse or doctor about these medications  ?? albuterol 90 mcg/actuation inhaler  ?? traMADol 50 mg tablet

## 2017-05-24 NOTE — ED Triage Notes (Signed)
PAtient c/o SOB with chest tightness with deep breathing x 2 days. Admits to needing an inhaler refill.

## 2017-05-25 LAB — EKG 12-LEAD
Atrial Rate: 91 {beats}/min
Diagnosis: NORMAL
P Axis: 62 degrees
P-R Interval: 132 ms
Q-T Interval: 346 ms
QRS Duration: 86 ms
QTc Calculation (Bazett): 425 ms
R Axis: 13 degrees
T Axis: 92 degrees
Ventricular Rate: 91 {beats}/min

## 2017-05-25 LAB — EKG, 12 LEAD, INITIAL
Atrial Rate: 91 {beats}/min
Calculated P Axis: 62 degrees
Calculated R Axis: 13 degrees
Calculated T Axis: 92 degrees
Diagnosis: NORMAL
P-R Interval: 132 ms
Q-T Interval: 346 ms
QRS Duration: 86 ms
QTC Calculation (Bezet): 425 ms
Ventricular Rate: 91 {beats}/min

## 2017-06-02 ENCOUNTER — Encounter

## 2017-06-16 ENCOUNTER — Inpatient Hospital Stay: Admit: 2017-06-16 | Payer: MEDICARE | Attending: Physical Medicine & Rehabilitation | Primary: Family Medicine

## 2017-06-16 DIAGNOSIS — I739 Peripheral vascular disease, unspecified: Secondary | ICD-10-CM

## 2017-06-23 ENCOUNTER — Encounter

## 2017-08-16 ENCOUNTER — Ambulatory Visit: Admit: 2017-08-16 | Discharge: 2017-08-16 | Payer: MEDICARE | Attending: Specialist | Primary: Family Medicine

## 2017-08-16 DIAGNOSIS — M170 Bilateral primary osteoarthritis of knee: Secondary | ICD-10-CM

## 2017-08-16 NOTE — Patient Instructions (Incomplete)
Knee Arthritis: Exercises  Your Care Instructions  Here are some examples of exercises for knee arthritis. Start each exercise slowly. Ease off the exercise if you start to have pain.  Your doctor or physical therapist will tell you when you can start these exercises and which ones will work best for you.  How to do the exercises  Knee flexion with heel slide    1. Lie on your back with your knees bent.  2. Slide your heel back by bending your affected knee as far as you can. Then hook your other foot around your ankle to help pull your heel even farther back.  3. Hold for about 6 seconds, then rest for up to 10 seconds.  4. Repeat 8 to 12 times.  5. Switch legs and repeat steps 1 through 4, even if only one knee is sore.    Quad sets    1. Sit with your affected leg straight and supported on the floor or a firm bed. Place a small, rolled-up towel under your knee. Your other leg should be bent, with that foot flat on the floor.  2. Tighten the thigh muscles of your affected leg by pressing the back of your knee down into the towel.  3. Hold for about 6 seconds, then rest for up to 10 seconds.  4. Repeat 8 to 12 times.  5. Switch legs and repeat steps 1 through 4, even if only one knee is sore.    Straight-leg raises to the front    1. Lie on your back with your good knee bent so that your foot rests flat on the floor. Your affected leg should be straight. Make sure that your low back has a normal curve. You should be able to slip your hand in between the floor and the small of your back, with your palm touching the floor and your back touching the back of your hand.  2. Tighten the thigh muscles in your affected leg by pressing the back of your knee flat down to the floor. Hold your knee straight.  3. Keeping the thigh muscles tight and your leg straight, lift your affected leg up so that your heel is about 12 inches off the floor. Hold for about 6 seconds, then lower slowly.   4. Relax for up to 10 seconds between repetitions.  5. Repeat 8 to 12 times.  6. Switch legs and repeat steps 1 through 5, even if only one knee is sore.    Active knee flexion    1. Lie on your stomach with your knees straight. If your kneecap is uncomfortable, roll up a washcloth and put it under your leg just above your kneecap.  2. Lift the foot of your affected leg by bending the knee so that you bring the foot up toward your buttock. If this motion hurts, try it without bending your knee quite as far. This may help you avoid any painful motion.  3. Slowly move your leg up and down.  4. Repeat 8 to 12 times.  5. Switch legs and repeat steps 1 through 4, even if only one knee is sore.    Quadriceps stretch (facedown)    1. Lie flat on your stomach, and rest your face on the floor.  2. Wrap a towel or belt strap around the lower part of your affected leg. Then use the towel or belt strap to slowly pull your heel toward your buttock until you feel a stretch.    3. Hold for about 15 to 30 seconds, then relax your leg against the towel or belt strap.  4. Repeat 2 to 4 times.  5. Switch legs and repeat steps 1 through 4, even if only one knee is sore.    Stationary exercise bike    1. If you do not have a stationary exercise bike at home, you can find one to ride at your local health club or community center.  2. Adjust the height of the bike seat so that your knee is slightly bent when your leg is extended downward. If your knee hurts when the pedal reaches the top, you can raise the seat so that your knee does not bend as much.  3. Start slowly. At first, try to do 5 to 10 minutes of cycling with little to no resistance. Then increase your time and the resistance bit by bit until you can do 20 to 30 minutes without pain.  4. If you start to have pain, rest your knee until your pain gets back to the level that is normal for you. Or cycle for less time or with less effort.     Follow-up care is a key part of your treatment and safety. Be sure to make and go to all appointments, and call your doctor if you are having problems. It's also a good idea to know your test results and keep a list of the medicines you take.  Where can you learn more?  Go to InsuranceStats.cahttp://www.healthwise.net/GoodHelpConnections.  Enter C159 in the search box to learn more about "Knee Arthritis: Exercises."  Current as of: February 04, 2017  Content Version: 11.9  ?? 2006-2018 [employer]. Care instructions adapted under license by [employer] (which disclaims liability or warranty for this information). If you have questions about a medical condition or this instruction, always ask your healthcare professional. Healthwise, Inc[employer] disclaims any warranty or liability for your use of this information.         Learning About Bariatric Surgery  What is bariatric surgery?    Bariatric surgery is surgery to help you lose weight. This type of surgery is only used for people who are very overweight and have not been able to lose weight with diet and exercise.  This surgery makes the stomach smaller. Some types of surgery also change the connection between your stomach and intestines.  How is bariatric surgery done?  Bariatric surgery may be either "open" or "laparoscopic." Open surgery is done through a large cut (incision) in the belly. Laparoscopic surgery is done through several small cuts. The doctor puts a lighted tube, or scope, and other surgical tools through small cuts in your belly. The doctor is able to see your organs with the scope. There are different types of bariatric surgery.  Gastric sleeve surgery  The surgery is usually done through several small incisions in the belly. The doctor removes more than half of your stomach. This leaves a thin sleeve, or tube, that is about the size of a banana. Because part of your stomach has been removed, this can't be reversed.   Roux-en-Y gastric bypass surgery  Roux-en-Y (say "roo-en-why") surgery changes the connection between the stomach and the intestines.  The doctor separates a section of your stomach from the rest of your stomach. This makes a small pouch. The new pouch will hold the food you eat. The doctor connects the stomach pouch to the middle part of the small intestine.  Gastric banding surgery  The surgery is usually done through several small incisions in the belly. The doctor wraps a band around the upper part of the stomach. This creates a small pouch. The small size of the pouch means that you will get full after you eat just a small amount of food. The doctor can inflate or deflate the band to adjust the size. This lets the doctor adjust how quickly food passes from the new pouch into the stomach. It does not change the connection between the stomach and the intestines.  What can you expect after the surgery?  You may stay in the hospital for one or more days after the surgery. How long you stay depends on the type of surgery you had.  Most people need 2 to 4 weeks before they are ready to get back to their usual routine.  For the first 2 to 6 weeks after surgery, you probably will need to follow a liquid or soft diet. Bit by bit, you will be able to eat more solid foods. Your doctor may advise you to work with a dietitian. This way you'll be sure to get enough protein, vitamins, and minerals while you are losing weight. Even with a healthy diet, you may need to take vitamin and mineral supplements.  After surgery, you will not be able to eat very much at one time. You will get full quickly. Try not to eat too much at one time or eat foods that are high in fat or sugar. If you do, you may vomit, get stomach pain, or have diarrhea.  You probably will lose weight very quickly in the first few months after surgery. As time goes on, your weight loss will slow down. You will have  regular doctor visits to check how you are doing.  Think of bariatric surgery as a tool to help you lose weight. It isn't an instant fix. You will still need to eat a healthy diet and get regular exercise. This will help you reach your weight goal and avoid regaining the weight you lose.  Follow-up care is a key part of your treatment and safety. Be sure to make and go to all appointments, and call your doctor if you are having problems. It's also a good idea to know your test results and keep a list of the medicines you take.  Where can you learn more?  Go to [employer]Stats.ca.  Enter G469 in the search box to learn more about "Learning About Bariatric Surgery."  Current as of: November 09, 2016  Content Version: 11.9  ?? 2006-2018 [employer]. Care instructions adapted under license by [employer] (which disclaims liability or warranty for this information). If you have questions about a medical condition or this instruction, always ask your healthcare professional. [employer], [employer] disclaims any warranty or liability for your use of this information.

## 2017-08-16 NOTE — Progress Notes (Signed)
Patient: Cheryl Paul                MRN: [id number]       SSN: [id number]  Date of Birth: May 14, 1951        AGE: 67 y.o.        SEX: female      PCP: Gwyneth Revels, MD  08/16/17    Chief Complaint   Patient presents with   ??? Knee Pain     bil knee pain      HISTORY:  Cheryl Paul is a 67 y.o. female who is seen for bilateral shoulder and knee pain.   She has been experiencing worsening pain for the past few years.  She states previous x-rays of her knees showed severe osteoarthritis.  She states that her knees pop like popcorn.   She was previously seen at [facility] by Dr. Noberto Retort about two years ago.  She states she had previous cortisone injections.  She has difficulty standing and sitting.  She previously saw a neurosurgereon who discussed doing an laminectomy.  She has posterior burning sensations in her knees.  She has shoulder pain radiating into her upper arms.  She has discussed bariatric surgery at [facility] but did not follow through with her surgery.     She sees Dr. Jorje Guild for pain management. She was previously seen by Dr. Synetta Shadow but is no longer able to be seen at [facility] since she switched insurances.     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  She has two great grandchildren ages 60 and 41.  She has 5 grandchildren. HGA1C 10.4 as of 04/06/16. Current weight and height are 279 lbs and 5'3".     Last 3 Recorded Weights in this Encounter    08/16/17 1425   Weight: 279 lb 3.2 oz (126.6 kg)     Body mass index is 49.46 kg/m??.     Patient Active Problem List   Diagnosis Code   ??? 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 Type 2 diabetes mellitus without complication (HCC) J62.8   ??? Incontinence in female R32   ??? Candidal intertrigo B37.2   ??? 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 Essential hypertension I10   ??? COPD (chronic obstructive pulmonary disease) (HCC) J44.9   ??? Hyperlipidemia E78.5   ??? Vitamin D deficiency E55.9   ??? Internal hemorrhoids K64.8   ??? Colon polyps K63.5   ??? Obesity, morbid ([place]) E66.01       REVIEW OF SYSTEMS: All Below are Negative except: See HPI     Constitutional: negative for fever, chills, and weight loss.   Cardiovascular: negative for chest pain, claudication, leg swelling, SOB, DOE   Gastrointestinal: Negative for pain, N/V/C/D, Blood in stool or urine, dysuria,  hematuria, incontinence, pelvic pain.   Musculoskeletal: See HPI   Neurological: Negative for dizziness and weakness.   Negative for headaches, Visual changes, confusion, seizures   Phychiatric/Behavioral:  Negative for depression, memory loss, substance  abuse.    Extremities: Negative for hair changes, rash, or skin lesion changes.   Hematologic: Negative for bleeding problems, bruising, pallor or swollen lymph  nodes   Peripheral Vascular: No calf pain, no circulation deficits.    Social History     Socioeconomic History   ??? Marital status: SINGLE     Spouse name: Not on file   ??? Number of children: Not on file   ??? Years of education: Not on file   ??? Highest education level: Not on file   Occupational History   ??? Not on file   Social Needs   ??? Financial resource strain: Not on file   ??? Food insecurity:     Worry: Not on file     Inability: Not on file   ??? Transportation needs:     Medical: Not on file     Non-medical: Not on file   Tobacco Use   ??? Smoking status: Never Smoker   ??? Smokeless tobacco: Never Used   Substance and Sexual Activity   ??? Alcohol use: No     Alcohol/week: 0.0 oz   ??? Drug use: No   ??? Sexual activity: Not on file   Lifestyle    ??? Physical activity:     Days per week: Not on file     Minutes per session: Not on file   ??? Stress: Not on file   Relationships   ??? Social connections:     Talks on phone: Not on file     Gets together: Not on file     Attends religious service: Not on file     Active member of club or organization: Not on file     Attends meetings of clubs or organizations: Not on file     Relationship status: Not on file   ??? Intimate partner violence:     Fear of current or ex partner: Not on file     Emotionally abused: Not on file     Physically abused: Not on file     Forced sexual activity: Not on file   Other Topics Concern   ??? 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 Denies history of smoking         Allergies   Allergen Reactions   ??? Penicillins Itching   ??? Butrans [Buprenorphine] Rash   ??? Iodine Rash   ??? Lactose Diarrhea     Lactose intolerance        Current Outpatient Medications   Medication Sig   ??? albuterol (PROVENTIL HFA, VENTOLIN HFA, PROAIR HFA) 90 mcg/actuation inhaler Take 1-2 Puffs by inhalation every four (4) hours as needed for Wheezing.   ??? fluticasone (FLONASE) 50 mcg/actuation nasal spray 2 Sprays by Both Nostrils route daily.   ??? insulin glargine (TOUJEO SOLOSTAR U-300 INSULIN) 300 unit/mL (1.5 mL) inpn 40 Units by SubCUTAneous route daily.   ??? acetaminophen (TYLENOL) 325 mg tablet Take 2 Tabs by mouth every four (4) hours as needed for Pain.   ??? ergocalciferol (ERGOCALCIFEROL) 50,000 unit capsule Take 1 Cap by mouth every seven (7) days.   ??? glucose blood VI test strips (ONETOUCH VERIO) strip Check blood sugar daily. DX E11.9   ??? atorvastatin (LIPITOR) 20 mg tablet TAKE ONE TABLET BY MOUTH ONE TIME DAILY   ??? lisinopril (PRINIVIL, ZESTRIL) 10 mg tablet TAKE 1 TABLET  BY MOUTH DAILY   ??? nystatin (MYCOSTATIN) topical cream Apply  to affected area two (2) times a day.    ??? glipiZIDE (GLUCOTROL) 5 mg tablet TAKE ONE TABLET BY MOUTH TWICE DAILY   ??? lancets (ONE TOUCH DELICA) 33 gauge misc Use daily to test blood sugar    E11.9   ??? budesonide-formoterol (SYMBICORT) 80-4.5 mcg/actuation HFAA inhaler Take 2 Puffs by inhalation two (2) times a day.   ??? metFORMIN (GLUCOPHAGE) 1,000 mg tablet Take 1 Tab by mouth two (2) times daily (with meals).   ??? Lancing Device with Lancets (ONE TOUCH DELICA) kit Test blood sugar daily DX E11.9   ??? alcohol swabs padm Use daily to test blood sugar  DX E11.9   ??? fluticasone (FLONASE) 50 mcg/actuation nasal spray 2 Sprays by Both Nostrils route daily.   ??? glucose blood VI test strips (ADVOCATE REDI-CODE+) strip Check blood sugar daily before breakfast   DX E11.9   ??? ULTRA THIN LANCETS 31 gauge misc    ??? traMADol (ULTRAM) 50 mg tablet Take 1 Tab by mouth every six (6) hours as needed for Pain. Max Daily Amount: 200 mg.   ??? predniSONE (STERAPRED DS) 10 mg dose pack Take 2 Tabs by mouth See Admin Instructions.     No current facility-administered medications for this visit.         PHYSICAL EXAMINATION:  Visit Vitals  BP 127/84   Pulse 97   Temp 97.6 ??F (36.4 ??C) (Oral)   Resp 16   Ht 5' 3"  (1.6 m)   Wt 279 lb 3.2 oz (126.6 kg)   SpO2 100%   BMI 49.46 kg/m??        ORTHO EXAMINATION:  Examination Left knee Right knee   Skin Intact Intact   Range of motion 95-5 95-5   Effusion - -   Medial joint line tenderness + +   Lateral joint line tenderness - -   Popliteal tenderness - -   Osteophytes palpable +, medial  +, medial   McMurray???s - -   Patella crepitus + +   Anterior drawer - -   Lateral laxity - -   Medial laxity - -   Varus deformity + +   Valgus deformity - -   Pretibial edema 2+ 2+   Calf tenderness - -     Examination Right shoulder Left shoulder   Skin Intact Intact   Effusion - -   Biceps deformity - -   Atrophy - -   AC joint tenderness - -   Acromial tenderness + +   Biceps tenderness - -   Forward flexion/Elevation ROM 175 175    Active abduction ROM 160 160   External rotation ROM 90 90   Internal rotation ROM 70 70   Apprehension - -   Impingement - -   Drop Arm Test - -   Neurovascular Intact Intact     RADIOLOGY:   XR BIL KNEE 09/03/14 [facility]  IMPRESSION:  Three views - No fractures, no effusion, bil severe end stage medial and patellofemoral joint space narrowing, + large osteophytes present. IKDC Grade D. Varus deformity    IMPRESSION:      ICD-10-CM ICD-9-CM    1. Bilateral primary osteoarthritis of knee M17.0 715.16 REFERRAL TO PHYSICAL THERAPY   2. Chronic pain of both knees M25.561 719.46 REFERRAL TO PHYSICAL THERAPY    M25.562 338.29     G89.29       PLAN:  She will be referred for bariatric surgery consultation. We discussed possible need for knee arthroplasty at some time in the future if pain continues pending weight loss <#200. I will see her back as needed.  Consider viscosupplementation if pain continues. She will start a brief course of outpatient aquatic physical therapy.    Scribed by Angelia Mould (DISH) as dictated by Delrae Sawyers, MD

## 2017-08-17 ENCOUNTER — Ambulatory Visit: Admit: 2017-08-17 | Discharge: 2017-08-17 | Payer: MEDICARE | Attending: Family | Primary: Family Medicine

## 2017-08-17 NOTE — Progress Notes (Signed)
A user error has taken place: encounter opened in error, closed for administrative reasons.

## 2017-08-20 ENCOUNTER — Ambulatory Visit: Admit: 2017-08-20 | Discharge: 2017-08-20 | Payer: MEDICARE | Attending: Surgery | Primary: Family Medicine

## 2017-08-20 NOTE — Progress Notes (Signed)
Initial Consultation for Bariatric Surgery Template     Cheryl Paul is a 67 y.o. black female who presents for discussion of the surgical options available for the definitive management of her clinically severe obesity.  Onset: Age 25 weighing 180 pounds on a 5 foot 3 inch frame.  Weight at age 55: 135 pounds on a 5 foot 3 inch frame.  Current weight: 279 pounds on a 5 foot 3 inch frame with a body mass index of 49.  Maximum weight: 303 pounds in 2017.  Time weight gain: Slowly progressive interrupted by dietary weight loss followed by regain of the last weight as well as additional weight that is exhibiting the yo-yo effect up to her maximum weight of 303 pounds which occurred in 2017.  Maximum medical weight loss attempts: Multiple unsupervised and supervised weight loss trials with a maximum loss occurring in 2008 losing 10 pounds over 3 months.  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  Current weight: 279.  BMI: 49.  Ideal body weight: 128.  Excess body weight: 151.  Estimated postsurgical weight loss: 121.  Postsurgical goal weight: 158.  Allergies: Penicillin???itching, iodine,Buprenorphine???rash.  Current medications: See medication list.  Past medical history:  1.  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  2.  Vitamin D deficiency.  3.  Allergic rhinitis.  4.  COPD.  5.  History of colonic polyps.  6.  History of candidal intertriginitis I am just giving a hard time  7.  Chronic sinusitis.  Past surgical history:  1.  TAH/BSO 1980s.  2.  Bilateral staged carpal tunnel releases 1982.   3.  Open cholecystectomy 1990s.  4.  Incisional hernia repair/primary 2002  5.  Recurrent incisional hernia repair with mesh 2004.  6.  Abdominal wound exploration/mesh removal 2004.  7.  Abdominal wound exploration/mesh removal/open lysis of adhesions for SBO 2004.  8.  Bilateral tubal ligation 1979  Social history: Denies utilization of tobacco and alcohol.  Family history.  Mother deceased 6???hypertension, adult onset diabetes mellitus, dialysis dependent renal failure, CVA, hypothyroidism, juvenile rheumatoid arthritis.  Father deceased 57???esophageal carcinoma.  Siblings x4???health unknown    Past Medical History:   Diagnosis Date   ??? Arthritis     knee, arms,    ??? Asthma    ??? Chronic obstructive pulmonary disease ([place])    ??? Diabetes ([place])    ??? H/O sinusitis    ??? Hypercholesterolemia    ??? Hypertension    ??? Toe fracture, left August 2015    4th toe       Past Surgical History:   Procedure Laterality Date   ??? ABDOMEN SURGERY PROC UNLISTED      4 surguries for blockages   ??? HX CARPAL TUNNEL RELEASE Bilateral    ??? HX CHOLECYSTECTOMY     ??? HX GYN     ??? HX HERNIA REPAIR      abdomen   ??? HX HYSTERECTOMY      tubes tied   ??? HX POLYPECTOMY  04/18/14       Current Outpatient Medications   Medication Sig Dispense Refill   ??? albuterol (PROVENTIL HFA, VENTOLIN HFA, PROAIR HFA) 90 mcg/actuation inhaler Take 1-2 Puffs by inhalation every four (  4) hours as needed for Wheezing. 1 Inhaler 0   ??? insulin glargine (TOUJEO SOLOSTAR U-300 INSULIN) 300 unit/mL (1.5 mL) inpn 40 Units by SubCUTAneous route daily.     ??? ergocalciferol (ERGOCALCIFEROL) 50,000 unit capsule Take 1 Cap by mouth every seven (7) days. 8 Cap 0   ??? glucose blood VI test strips (ONETOUCH VERIO) strip Check blood sugar daily. DX E11.9 100 Strip 3   ??? atorvastatin (LIPITOR) 20 mg tablet TAKE ONE TABLET BY MOUTH ONE TIME DAILY 90 Tab 2   ??? lisinopril (PRINIVIL, ZESTRIL) 10 mg tablet TAKE 1 TABLET BY MOUTH DAILY 90 Tab 1    ??? nystatin (MYCOSTATIN) topical cream Apply  to affected area two (2) times a day. 15 g 1   ??? glipiZIDE (GLUCOTROL) 5 mg tablet TAKE ONE TABLET BY MOUTH TWICE DAILY 180 Tab 1   ??? lancets (ONE TOUCH DELICA) 33 gauge misc Use daily to test blood sugar    E11.9 100 Lancet 3   ??? metFORMIN (GLUCOPHAGE) 1,000 mg tablet Take 1 Tab by mouth two (2) times daily (with meals). 180 Tab 1   ??? Lancing Device with Lancets (ONE TOUCH DELICA) kit Test blood sugar daily DX E11.9 1 Kit 0   ??? alcohol swabs padm Use daily to test blood sugar  DX E11.9 100 Pad 1   ??? ULTRA THIN LANCETS 31 gauge misc      ??? traMADol (ULTRAM) 50 mg tablet Take 1 Tab by mouth every six (6) hours as needed for Pain. Max Daily Amount: 200 mg. 15 Tab 0   ??? predniSONE (STERAPRED DS) 10 mg dose pack Take 2 Tabs by mouth See Admin Instructions. 48 Tab 0   ??? acetaminophen (TYLENOL) 325 mg tablet Take 2 Tabs by mouth every four (4) hours as needed for Pain. 20 Tab 0   ??? budesonide-formoterol (SYMBICORT) 80-4.5 mcg/actuation HFAA inhaler Take 2 Puffs by inhalation two (2) times a day. 3 Inhaler 1   ??? fluticasone (FLONASE) 50 mcg/actuation nasal spray 2 Sprays by Both Nostrils route daily. 1 Bottle 0   ??? glucose blood VI test strips (ADVOCATE REDI-CODE+) strip Check blood sugar daily before breakfast   DX E11.9 50 Strip 3       Allergies   Allergen Reactions   ??? Penicillins Itching   ??? Butrans [Buprenorphine] Rash   ??? Iodine Rash   ??? Lactose Diarrhea     Lactose intolerance       Social History     Tobacco Use   ??? Smoking status: Never Smoker   ??? Smokeless tobacco: Never Used   Substance Use Topics   ??? Alcohol use: No     Alcohol/week: 0.0 oz   ??? Drug use: No       Family History   Problem Relation Age of Onset   ??? Hypertension Mother    ??? Stroke Mother    ??? Diabetes Mother    ??? Thyroid Disease Mother    ??? Arthritis-rheumatoid Mother    ??? Cancer Father         colon polpe   ??? Cancer Maternal Aunt        Family Status   Relation Name Status   ??? Mother  Deceased    ??? Father  Deceased   ??? MAunt  (Not Specified)       Review of Systems:  Positive in BOLD    CONST: Fever, weight loss, fatigue or chills  GI: Nausea, vomiting, abdominal  pain, change in bowel habits, hematochezia, melena, and GERD   INTEG: Dermatitis, abnormal moles  HEENT: Recent changes in vision, vertigo, epistaxis, dysphagia and hoarseness  CV: Chest pain, palpitations, HTN, edema and varicosities  RESP: Cough, shortness of breath, wheezing, hemoptysis, snoring and reactive airway disease  GU: Hematuria, dysuria, frequency, urgency, nocturia and stress urinary incontinence   MS: Weakness, joint pain and arthritis  ENDO: Diabetes, thyroid disease, polyuria, polydipsia, polyphagia, poor wound healing, heat intolerance, cold intolerance  LYMPH/HEME: Anemia, bruising and history of blood transfusions  NEURO: Dizziness, headache, fainting, seizures and stroke  PSYCH: Anxiety and depression      Physical Exam    Visit Vitals  BP 133/67 (BP 1 Location: Right arm, BP Patient Position: Sitting)   Pulse 72   Temp 96.9 ??F (36.1 ??C) (Oral)   Ht 5' 3"  (1.6 m)   Wt 126.2 kg (278 lb 3.2 oz)   SpO2 100%   BMI 49.28 kg/m??               General: 67 y.o.) black female in no acute distress who is clinically severely obese  HEENT: Normocephalic, atraumatic, Pupils equal and reactive, nasopharynx clear, oropharynx clear and moist without lesions  NECK: Supple, no lymphadenopathy, thyromegaly, carotid bruits or jugular venous distension. trachea midline  RESP: Clear to auscultation bilaterally, no wheezes, rhonchi, or rales, normal respiratory excursion  CV: Regular rate and rhythm, no murmurs, rubs or gallops. 3+/4 pulses in bilateral dorsalis pedis and posterior tibialis. No distal edema or varicosities.  ABD: Soft, nontender, nondistended, normoactive bowel sounds, no hernias, no hepatosplenomegaly, well-healed infraumbilical midline incision extending from the xiphoid junction to the symphysis pubis, large  transverse Pfannenstiel incision, right subcostal incision without evidence of hernia  Extremities: Warm, well perfused, no tenderness or swelling, normal gait/station  Neuro: Sensation and strength grossly intact and symmetrical  Psych: Alert and oriented to person, place, and time.    Impression:    Cheryl Paul is a 67 y.o. black female with clinically severe obesity and a body mass index of 49 with obesity related comorbidities of hypertension, adult onset diabetes mellitus, hypercholesterolemia, reactive airway disease, stress urinary incontinence, obstructive sleep apnea, and weight related arthropathy of her back, hips, knees, feet who would benefit from bariatric surgery.  We have had an extensive discussion with regard to the risks, benefits and likely outcomes of the operation. We've discussed the restrictive and malabsorptive nature of the gastric bypass and compared and contrasted with the sleeve gastrectomy.  The patient understands the likelihood of losing approximately 80% of their excess weight in 12 to 18 months.  The patient also understands the risks including but not limited to potential of open laparotomy, space bleeding, infection, need for reoperation, ulcers, leaks and strictures, bowel obstruction secondary to adhesions and internal hernias, DVT, PE, heart attack, stroke, and death. Patient also understands risks of inadequate weight loss, excess weight loss, vitamin insufficiency, protein malnutrition, excess skin, and loss of hair.  We have reviewed the components of a successful postoperative course including requirement for a high protein, low carbohydrate diet, 60 oz a day of zero calorie liquids, daily vitamin supplementation, daily exercise, regular follow-up, and participation in support groups. At this time we will enroll the patient in our bariatric program, undertake routine laboratory evaluation, chest X-ray, EKG,  and evaluation by  nutritionist as well as  psychologist, clearance by her family physician, pulmonology, and cardiology and pending their satisfactory completion of the preop evaluation, plan to  perform a laparoscopic potentially open Roux-en-Y gastric bypass for definitive weight loss with expected resolution of obesity related comorbidities

## 2017-08-20 NOTE — Addendum Note (Signed)
Addended by: Norma FredricksonUDLEY, Julius Boniface C on: 08/23/2017 12:50 PM     Modules accepted: Orders

## 2017-08-20 NOTE — Progress Notes (Signed)
Cheryl Paul Behrman is a 67 y.o. female (DOB: 01/23/1951) presenting to address:    Chief Complaint   Patient presents with   ??? Weight Management     GBP consultation        Medication list and allergies have been reviewed with Cheryl Paul Pilgrim and updated as of today's date.     I have gone over all Medical, Surgical and Social History with Cheryl Paul Gruen and updated/added the information accordingly.

## 2017-08-26 ENCOUNTER — Inpatient Hospital Stay: Admit: 2017-08-26 | Primary: Family Medicine

## 2017-08-26 NOTE — Progress Notes (Signed)
[facility]  [address]  [facility], [address]    Patient's Name: Cheryl MawBrenda T Paul   Age: 67 y.o.  Date of Birth: [birth year]   Sex: female    Date:   08/26/2017    Insurance:              Session: 1 of  6  Revision:     Surgeon:  Dr. Pernell DupreAdams     Height: 5 f 3   Weight:    276      Lbs.     BMI: 48.9   Pounds Lost since last month: 3                 Pounds Gained since last month: 0    Starting Weight: 279     Previous Month???s Weight: 279  Overall Pounds Lost: 3   Overall Pounds Gained: 0    Do you smoke?  None    Alcohol intake:  Number of drinks at a time:  None  Number of times a week: None    Class Guidelines    Guidelines are reviewed with patient at the start of every class.    1. Patient understands that weight loss trial classes must be consecutive.  Patient understands if they miss a class, it is their responsibility to contact me to reschedule class.  I will reach out to patient after their first no show.  2.  Patient understands the expectations that weight maintenance/weight loss is expected during the classes.  Failure to demonstrate changes may result in one extra month of weight loss trial, followed by going back to see the surgeon.  Patient understands that they CAN NOT gain any weight during the weight loss trial.  Gaining weight will result in extra classes.  3. Patient is also instructed to be doing their labs, blood work, psych visit, support group and any other test that the surgeon has used while they are working on their weight loss trial.  4.  Patient was instructed to bring their blue binder to every class and appointment.      Other Pertinent Information:     Changes Made Since Last Class: None, first class.    Eating Habits and Behaviors      We started off class today by reviewing key diet principles.  Patient was given a very specific list of foods that they can eat, which included  meat, fish, vegetables, eggs, cheese, fats, soy, and berries.  Patient was also given a list of foods that should be avoided.  These included sweets (candy, soda, baked goods, ice cream), and starches, including pasta, rice, crackers, chips, oatmeal, bread.  We talked about appropriate protein-based snacks, including deli meat, low fat cheese, yogurt, hummus, small handful of almonds.    I also gave a power point on ways to help with the metabolism.  Some of the food-related ways included: Healthy snacking, eating adequate protein, and getting adequate water.  Mild dehydration may slow down one's weight loss.      Patient's current diet habits include: 2 meals per day.  Patient is eating bacon and eggs for breakfast.  Lunch is chicken or tuna salad sandwich.  She is doing broccoli or chicken and may have some yellow rice with this.  She states she may eat out 1-2 x a month.  She is drinking water and sweet tea, which I have encouraged her not to drink  her liquid calories.        Physical Activity/Exercise    Comments:     Currently for exercise, patient is doing very little because of arthritis and pain.  We talked about activities for patient to do, including walking, swimming, or chair exercises.  I also talked with patient about doing some strength training, which helps the metabolism, as well.    Behavior Modification       Comments:   In the power point, Mastering Your Metabolism, I also gave behaviors that can help with one's metabolism.  These include not skipping meals and being sure to feed and fuel that body rather than going large gaps and putting the body in starvation mode.      One goal for next month includes:  1.  Cut down on carbohydrates.  2. Cut down on sugar content.        Adela Ports, MS RD  08/26/2017

## 2017-09-09 ENCOUNTER — Inpatient Hospital Stay: Payer: MEDICARE | Primary: Family Medicine

## 2017-09-09 NOTE — Telephone Encounter (Addendum)
Attempted to contact [facility]--only information that comes up under [facility] is [facility] [address]--left message for pt to leave phone or fax # for [facility]

## 2017-09-09 NOTE — Telephone Encounter (Signed)
Pt states she cannot afford to continue with PT at [facility].  However, she spoke with her insurance company and they suggested [facility] [address].  She would like her order sent there. Please call pt at [phone].

## 2017-09-10 NOTE — Telephone Encounter (Signed)
Left message again for pt to contact us with name of therapy location

## 2017-09-13 NOTE — Telephone Encounter (Signed)
Left another voice message for patient to contact our office with [facility] contact info.

## 2017-09-13 NOTE — Telephone Encounter (Signed)
Patient notified again if she can provide [place] with a phone number we will be glad to fax an order to them but we do not know of a [facility] on [address].

## 2017-09-15 NOTE — Telephone Encounter (Signed)
Patient called back to give [place] the tel # and fax # for [facility] on [address]    [facility] tel. [phone].  Fax # [phone].    Patient is requesting the order be sent as soon as possible, due to she has an appointment with Physical Therapy tomorrow 09/16/17 at 1:00 pm.    Patient tel. [phone].

## 2017-09-15 NOTE — Telephone Encounter (Addendum)
Order re-printed and faxed--left message for pt that we faxed order to [facility]

## 2017-09-23 NOTE — Telephone Encounter (Signed)
Spoke with patient gave her appointment with Pulmonary/Sleep on May 31st with Dr Margit Hanks [facility] office.

## 2017-09-24 ENCOUNTER — Ambulatory Visit
Admit: 2017-09-24 | Discharge: 2017-09-24 | Payer: MEDICARE | Attending: Cardiovascular Disease | Primary: Family Medicine

## 2017-09-24 DIAGNOSIS — R9431 Abnormal electrocardiogram [ECG] [EKG]: Secondary | ICD-10-CM

## 2017-09-24 NOTE — Progress Notes (Signed)
Cheryl Paul presents today for   Chief Complaint   Patient presents with   ??? Surgical Clearance     Bariatric surgery with Dr. Karleen Hampshire    ??? Hospital Follow Up     Chest pain 05/24/2017   ??? Shortness of Breath     with exertion        Cheryl Paul preferred language for health care discussion is english/other.    Is someone accompanying this pt? No     Is the patient using any DME equipment during OV? No     Depression Screening:  3 most recent PHQ Screens 08/20/2017   Little interest or pleasure in doing things Not at all   Feeling down, depressed, irritable, or hopeless Not at all   Total Score PHQ 2 0       Learning Assessment:  Learning Assessment 11/14/2013   PRIMARY LEARNER Patient   HIGHEST LEVEL OF EDUCATION - PRIMARY LEARNER  2 YEARS OF COLLEGE   PRIMARY LANGUAGE ENGLISH   LEARNER PREFERENCE PRIMARY LISTENING   ANSWERED BY self   RELATIONSHIP SELF       Abuse Screening:  Abuse Screening Questionnaire 08/20/2017   Do you ever feel afraid of your partner? N   Are you in a relationship with someone who physically or mentally threatens you? N   Is it safe for you to go home? Y       Fall Risk  Fall Risk Assessment, last 12 mths 08/20/2017   Able to walk? Yes   Fall in past 12 months? No       Pt currently taking Antiplatelet therapy? No    Coordination of Care:  1. Have you been to the ER, urgent care clinic since your last visit? Hospitalized since your last visit? 05/24/17 Chest pain and SOB     2. Have you seen or consulted any other health care providers outside of the [facility] since your last visit? Include any pap smears or colon screening. No

## 2017-09-24 NOTE — Progress Notes (Signed)
HISTORY OF PRESENT ILLNESS  Cheryl Paul is a 67 y.o. female.    HPI    Patient presents for a new office visit.  She does not have a prior cardiac history.  She does have a history of long-standing type 2 diabetes, essential hypertension, dyslipidemia and morbid obesity.  She was referred here for perioperative evaluation prior to undergoing bariatric weight loss surgery.  Patient was seen in the emergency room in January 2019 for an episode of chest pain which lasted for several days.  Her EKG showed nonspecific T wave changes, her cardiac enzymes are normal and she was discharged home with a diagnosis of musculoskeletal chest pain.  Patient denies having any recurrence of the pain since that episode.    Patient admits that she is not very active primarily due to bilateral knee pain.  She states she was diagnosed with severe osteoarthritis and is currently doing physical therapy to improve her mobility.  She states she cannot climb up and down stairs because of her knees.  Her most physical activity that she performs is walking around the store.  She denies any new shortness of breath, no orthopnea, no PND, no leg swelling.    Past Medical History:   Diagnosis Date   ??? Arthritis     knee, arms,    ??? Asthma    ??? Chronic obstructive pulmonary disease ([place])    ??? Diabetes ([place])    ??? H/O sinusitis    ??? Hypercholesterolemia    ??? Hypertension    ??? Toe fracture, left August 2015    4th toe     Current Outpatient Medications   Medication Sig Dispense Refill   ??? albuterol (PROVENTIL HFA, VENTOLIN HFA, PROAIR HFA) 90 mcg/actuation inhaler Take 1-2 Puffs by inhalation every four (4) hours as needed for Wheezing. 1 Inhaler 0   ??? insulin glargine (TOUJEO SOLOSTAR U-300 INSULIN) 300 unit/mL (1.5 mL) inpn 40 Units by SubCUTAneous route daily.     ??? ergocalciferol (ERGOCALCIFEROL) 50,000 unit capsule Take 1 Cap by mouth every seven (7) days. 8 Cap 0   ??? glucose blood VI test strips (ONETOUCH VERIO) strip Check blood sugar  daily. DX E11.9 100 Strip 3   ??? atorvastatin (LIPITOR) 20 mg tablet TAKE ONE TABLET BY MOUTH ONE TIME DAILY 90 Tab 2   ??? lisinopril (PRINIVIL, ZESTRIL) 10 mg tablet TAKE 1 TABLET BY MOUTH DAILY 90 Tab 1   ??? nystatin (MYCOSTATIN) topical cream Apply  to affected area two (2) times a day. 15 g 1   ??? glipiZIDE (GLUCOTROL) 5 mg tablet TAKE ONE TABLET BY MOUTH TWICE DAILY 180 Tab 1   ??? lancets (ONE TOUCH DELICA) 33 gauge misc Use daily to test blood sugar    E11.9 100 Lancet 3   ??? metFORMIN (GLUCOPHAGE) 1,000 mg tablet Take 1 Tab by mouth two (2) times daily (with meals). 180 Tab 1   ??? Lancing Device with Lancets (ONE TOUCH DELICA) kit Test blood sugar daily DX E11.9 1 Kit 0   ??? alcohol swabs padm Use daily to test blood sugar  DX E11.9 100 Pad 1   ??? fluticasone (FLONASE) 50 mcg/actuation nasal spray 2 Sprays by Both Nostrils route daily. 1 Bottle 0   ??? glucose blood VI test strips (ADVOCATE REDI-CODE+) strip Check blood sugar daily before breakfast   DX E11.9 50 Strip 3   ??? ULTRA THIN LANCETS 31 gauge misc      ??? budesonide-formoterol (SYMBICORT) 80-4.5 mcg/actuation  HFAA inhaler Take 2 Puffs by inhalation two (2) times a day. 3 Inhaler 1     Allergies   Allergen Reactions   ??? Penicillins Itching   ??? Butrans [Buprenorphine] Rash   ??? Iodine Rash   ??? Lactose Diarrhea     Lactose intolerance      Social History     Tobacco Use   ??? Smoking status: Never Smoker   ??? Smokeless tobacco: Never Used   Substance Use Topics   ??? Alcohol use: No     Alcohol/week: 0.0 oz   ??? Drug use: No     Family History   Problem Relation Age of Onset   ??? Hypertension Mother    ??? Stroke Mother    ??? Diabetes Mother    ??? Thyroid Disease Mother    ??? Arthritis-rheumatoid Mother    ??? Cancer Father         colon polpe   ??? Cancer Maternal Aunt            Review of Systems   Constitutional: Negative for chills, fever and weight loss.   HENT: Negative for nosebleeds.    Eyes: Negative for blurred vision and double vision.    Respiratory: Negative for cough, shortness of breath and wheezing.    Cardiovascular: Positive for chest pain. Negative for palpitations, orthopnea, claudication, leg swelling and PND.   Gastrointestinal: Negative for abdominal pain, heartburn, nausea and vomiting.   Genitourinary: Negative for dysuria and hematuria.   Musculoskeletal: Positive for joint pain. Negative for falls and myalgias.   Skin: Negative for rash.   Neurological: Negative for dizziness, focal weakness and headaches.   Endo/Heme/Allergies: Does not bruise/bleed easily.   Psychiatric/Behavioral: Negative for substance abuse.     Visit Vitals  BP 132/78   Pulse 74   Ht 5' 3"  (1.6 m)   Wt 125.6 kg (277 lb)   SpO2 98%   BMI 49.07 kg/m??       Physical Exam   Constitutional: She is oriented to person, place, and time. She appears well-developed and well-nourished.   HENT:   Head: Normocephalic and atraumatic.   Eyes: Conjunctivae are normal.   Neck: Neck supple. No JVD present. Carotid bruit is not present.   Cardiovascular: Normal rate, regular rhythm, S1 normal, S2 normal and normal pulses. Exam reveals distant heart sounds. Exam reveals no gallop.   No murmur heard.  Pulmonary/Chest: Effort normal and breath sounds normal. She has no wheezes. She has no rales.   Abdominal: Soft. Bowel sounds are normal. There is no tenderness.   Musculoskeletal: She exhibits no edema, tenderness or deformity.   Neurological: She is alert and oriented to person, place, and time.   Skin: Skin is warm and dry.   Psychiatric: She has a normal mood and affect. Her behavior is normal. Thought content normal.     EKG: Normal sinus rhythm, normal axis, low voltage, nonspecific T wave abnormality.  No ST changes concerning for ischemia.  No significant change compared to the previous EKG.    ASSESSMENT and PLAN  Encounter Diagnoses   Name Primary?   ??? Abnormal EKG Yes   ??? Preoperative clearance    ??? Other chest pain     ??? Type 2 diabetes mellitus without complication, without long-term current use of insulin ([place])    ??? Essential hypertension    ??? Obesity, morbid ([place])    ??? Pure hypercholesterolemia      Abnormal EKG.  Patient does  appear to have chronic nonspecific T wave changes that were present back in January of this year.  She was having some chest pain at that time.  She has what sounds like very poor functional capacity, so I have recommended a pharmacologic nuclear stress test for further stratification.  If this is a low or intermediate test, she is at acceptable risk to proceed with her bariatric surgery without any further cardiac work-up.    Morbid obesity.  Patient is in the process of being evaluated for bariatric weight loss surgery.  As long as her stress test is unremarkable, she can proceed with this when scheduled.    Essential hypertension.  Patient's blood pressure appears to be reasonably well controlled on her current regimen of lisinopril as monotherapy.    Diabetes mellitus type 2.  Long-standing for greater than 10 years.  She is on both insulin and oral agents.  Followed by her PCP.  From a cardiac standpoint for hemoglobin A1c to be less than 7.    Dyslipidemia.  Patient has been treated with atorvastatin 20 mg daily.  Is also followed by her PCP.    As long as her stress test is unremarkable, patient can follow-up in the future as needed.

## 2017-10-01 ENCOUNTER — Inpatient Hospital Stay: Admit: 2017-10-01 | Primary: Family Medicine

## 2017-10-08 ENCOUNTER — Inpatient Hospital Stay: Admit: 2017-10-08 | Payer: MEDICARE | Attending: Cardiovascular Disease | Primary: Family Medicine

## 2017-10-08 DIAGNOSIS — R0789 Other chest pain: Secondary | ICD-10-CM

## 2017-10-08 LAB — NM STRESS TEST WITH MYOCARDIAL PERFUSION
Baseline Diastolic BP: 80 mmHg
Baseline HR: 83 {beats}/min
Baseline Systolic BP: 120 mmHg
Left Ventricular Ejection Fraction: 67
Stress Diastolic BP: 70 mmHg
Stress Estimated Workload: 1 METS
Stress Peak HR: 104 {beats}/min
Stress Percent HR Achieved: 67 %
Stress Rate Pressure Product: 12480 BPM*mmHg
Stress ST Depression: 0 mm
Stress ST Elevation: 0 mm
Stress Systolic BP: 120 mmHg
Stress Target HR: 155 {beats}/min

## 2017-10-08 LAB — NUCLEAR CARDIAC STRESS TEST
Baseline Diastolic BP: 80 mmHg
Baseline HR: 83 {beats}/min
Baseline Systolic BP: 120 mmHg
Stress Diastolic BP: 70 mmHg
Stress Estimated Workload: 1 METS
Stress Peak HR: 104 {beats}/min
Stress Percent HR Achieved: 67 %
Stress Rate Pressure Product: 12480 BPM*mmHg
Stress ST Depression: 0 mm
Stress ST Elevation: 0 mm
Stress Systolic BP: 120 mmHg
Stress Target HR: 155 {beats}/min

## 2017-10-08 MED ORDER — TECHNETIUM TC 99M SESTAMIBI - CARDIOLITE
Freq: Once | Status: AC
Start: 2017-10-08 — End: 2017-10-08
  Administered 2017-10-08: 12:00:00 via INTRAVENOUS

## 2017-10-08 MED ORDER — SODIUM CHLORIDE 0.9 % IV
Freq: Once | INTRAVENOUS | Status: AC
Start: 2017-10-08 — End: 2017-10-08
  Administered 2017-10-08: 14:00:00 via INTRAVENOUS

## 2017-10-08 MED ORDER — SODIUM CHLORIDE 0.9 % IJ SYRG
INTRAMUSCULAR | Status: AC | PRN
Start: 2017-10-08 — End: 2017-10-08
  Administered 2017-10-08: 12:00:00 via INTRAVENOUS

## 2017-10-08 MED ORDER — REGADENOSON 0.4 MG/5 ML IV SYRINGE
0.4 mg/5 mL | Freq: Once | INTRAVENOUS | Status: AC
Start: 2017-10-08 — End: 2017-10-08
  Administered 2017-10-08: 14:00:00 via INTRAVENOUS

## 2017-10-08 MED ORDER — TECHNETIUM TC 99M SESTAMIBI - CARDIOLITE
Freq: Once | Status: AC
Start: 2017-10-08 — End: 2017-10-08
  Administered 2017-10-08: 14:00:00 via INTRAVENOUS

## 2017-10-08 MED FILL — BD POSIFLUSH NORMAL SALINE 0.9 % INJECTION SYRINGE: INTRAMUSCULAR | Qty: 10

## 2017-10-08 MED FILL — LEXISCAN 0.4 MG/5 ML INTRAVENOUS SYRINGE: 0.4 mg/5 mL | INTRAVENOUS | Qty: 5

## 2017-10-08 MED FILL — SODIUM CHLORIDE 0.9 % IV: INTRAVENOUS | Qty: 1000

## 2017-10-08 NOTE — Progress Notes (Signed)
Patient was injected with 11.0 millicuries 99mTc Sestamibi on 10/08/17 at 0820.    Patient was injected with 33.0 millicuries 99mTc Sestamibi on 10/08/17 at 1005.    Patient's armbands were removed and placed in shred-it box.    Patient had a Nuclear Lexiscan Stress Test.

## 2017-10-08 NOTE — Progress Notes (Signed)
Per your last note" Abnormal EKG.  Patient does appear to have chronic nonspecific T wave changes that were present back in January of this year.  She was having some chest pain at that time.  She has what sounds like very poor functional capacity, so I have recommended a pharmacologic nuclear stress test for further stratification.  If this is a low or intermediate test, she is at acceptable risk to proceed with her bariatric surgery without any further cardiac work-up.  ??

## 2017-10-08 NOTE — Progress Notes (Signed)
 Per your last note Abnormal EKG.  Patient does appear to have chronic nonspecific T wave changes that were present back in January of this year.  She was having some chest pain at that time.  She has what sounds like very poor functional capacity, so I have recommended a pharmacologic nuclear stress test for further stratification.  If this is a low or intermediate test, she is at acceptable risk to proceed with her bariatric surgery without any further cardiac work-up.

## 2017-10-08 NOTE — Progress Notes (Signed)
Patient was injected with 11.0 millicuries Sestamibi on 10/08/17 at 0820.    Patient was injected with 33.0 millicuries Sestamibi on 10/08/17 at 1005.    Patient's armbands were removed and placed in shred-it box.    Patient had a Catering manager Stress Test.

## 2017-10-19 NOTE — Telephone Encounter (Signed)
Unable to leave voicemail at number listed on file, letter sent.

## 2017-10-19 NOTE — Telephone Encounter (Signed)
-----   Message from Jerlyn LyMarc J Rosenberg, MD sent at 10/18/2017  3:02 PM EDT -----  Please let the patient know that her stress test was normal.  She is low risk to proceed with surgery as scheduled.  No further cardiac testing is necessary.  ----- Message -----  From: Corky MullBrown, Abella Shugart L, LPN  Sent: 1/61/09605/29/2019   3:21 PM  To: Jerlyn LyMarc J Rosenberg, MD    Per your last note" Abnormal EKG.  Patient does appear to have chronic nonspecific T wave changes that were present back in January of this year.  She was having some chest pain at that time.  She has what sounds like very poor functional capacity, so I have recommended a pharmacologic nuclear stress test for further stratification.  If this is a low or intermediate test, she is at acceptable risk to proceed with her bariatric surgery without any further cardiac work-up.  ??

## 2017-11-03 ENCOUNTER — Inpatient Hospital Stay: Admit: 2017-11-03 | Primary: Family Medicine

## 2017-11-03 NOTE — Progress Notes (Signed)
[facility]  [address] [facility] [address], [address]    Patient's Name: Cheryl Paul   Age: 67 y.o.  Date of Birth: 1950/07/11   Sex: female    Date:   11/03/2017    Insurance:            Session: 3 of  6  Revision:   Surgeon:  Dr. Pernell Dupre    Height: 5 f 3 Weight:    273      Lbs.   BMI: 48.4   Pounds Lost since last month: 0               Pounds Gained since last month: 0    Starting Weight: 279  Previous Month???s Weight: 273  Overall Pounds Lost: 6 Overall Pounds Gained: 0      Do you smoke?  None    Alcohol intake:  Number of drinks at a time:  None  Number of times a week: None    Class Guidelines    Guidelines are reviewed with patient at the start of every class.    1. Patient understands that weight loss trial classes must be consecutive.  Patient understands if they miss a class, it is their responsibility to contact me to reschedule class.  I will reach out to patient after their first no show.  2.  Patient understands the expectations that weight maintenance/weight loss is expected during the classes.  Failure to demonstrate changes may result in one extra month of weight loss trial, followed by going back to see the surgeon.  Patient understands that they CAN NOT gain any weight during the weight loss trial.  Gaining weight will result in extra classes.  3. Patient is also instructed to be doing their labs, blood work, psych visit, support group and any other test that the surgeon has used while they are working on their weight loss trial.  4.  Patient was instructed to bring their blue binder to every class and appointment.      Other Pertinent Information:     Changes Made Since Last Class: Patient states she is not eating as much as she previously was.    Eating Habits and Behaviors    Today in class, I reviewed a power point that discussed Nutrition, Behavior, and Exercise changes to start working on.  Some of the eating  behaviors that we discussed included the importance of eating breakfast.  We talked about some of the reasons that people don't eat breakfast and food choices that would be appropriate.    We also talked about avoiding liquid calories.  Patient is encouraged to aim for 64 ounces of fluid per day and trying to get them from water, Crystal Light, sugar free drinks.    We also talked about eating out options that are healthier.  Patient was encouraged to always request sauces and dressings on the side and request a salad, broth-based soup, or vegetables in place of fries.      Patient's current diet habits include: 3 meals per day.  Patient is doing eggs and [place] sausage for breakfast.  Lunch is tuna or [place].  Dinner is cabbage, steak, string beans.  She states she is snacking on fruit.  She is eating out 1-2 x a month and may have a burger.  She is drinking 36 ounces of water per day.        Physical Activity/Exercise  Comments:    We talked about exercise.  Patient was given reasons of why exercise is so important and how that can help with their long-term success.  I have encouraged patient to get a support system to help with the activity.    Currently for activity, patient is doing very little stating she has a lot of knee pain.  Goals for chair exercises have been set.      Behavior Modification       Comments:   We also talked about behavior modifications.  We talked about eating triggers, such as eating in front of the TV and solutions, such as making the TV a no eating zone.  If patient is eating out out of emotion, food will only temporarily solve that.  Patient is encouraged to HALT and assess if they are eating because they are Hungry, or out of emotions: Anxious, Lonely, Tired, which the food will only temporarily solve.  We also talked about ways to prevent relapse.    Goals that patient wants to work on includes:  1. Cut out all sugar by next month.  2. Lose 5 pounds by next class.     Adela PortsJacqueline Browning, MS RD  11/03/2017

## 2017-11-03 NOTE — Progress Notes (Signed)
[facility]  [address] [facility] [address], [address]    Patient's Name: Cheryl Paul   Age: 67 y.o.  Date of Birth: [birth year]   Sex: female    Date:   11/03/2017    Insurance:            Session: 3 of  6  Revision:   Surgeon:  Dr. Pernell Dupre    Height: 5 f 3 Weight:    273      Lbs.   BMI: 48.4   Pounds Lost since last month: 0               Pounds Gained since last month: 0    Starting Weight: 279  Previous Month's Weight: 273  Overall Pounds Lost: 6 Overall Pounds Gained: 0      Do you smoke?  None    Alcohol intake:  Number of drinks at a time:  None  Number of times a week: None    Class Guidelines    Guidelines are reviewed with patient at the start of every class.    1. Patient understands that weight loss trial classes must be consecutive.  Patient understands if they miss a class, it is their responsibility to contact me to reschedule class.  I will reach out to patient after their first no show.  2.  Patient understands the expectations that weight maintenance/weight loss is expected during the classes.  Failure to demonstrate changes may result in one extra month of weight loss trial, followed by going back to see the surgeon.  Patient understands that they CAN NOT gain any weight during the weight loss trial.  Gaining weight will result in extra classes.  3. Patient is also instructed to be doing their labs, blood work, psych visit, support group and any other test that the surgeon has used while they are working on their weight loss trial.  4.  Patient was instructed to bring their blue binder to every class and appointment.      Other Pertinent Information:     Changes Made Since Last Class: Patient states she is not eating as much as she previously was.    Eating Habits and Behaviors    Today in class, I reviewed a power point that discussed Nutrition, Behavior, and Exercise changes to start working on.  Some of the eating behaviors that we  discussed included the importance of eating breakfast.  We talked about some of the reasons that people don't eat breakfast and food choices that would be appropriate.    We also talked about avoiding liquid calories.  Patient is encouraged to aim for 64 ounces of fluid per day and trying to get them from water, Crystal Light, sugar free drinks.    We also talked about eating out options that are healthier.  Patient was encouraged to always request sauces and dressings on the side and request a salad, broth-based soup, or vegetables in place of fries.      Patient's current diet habits include: 3 meals per day.  Patient is doing eggs and [place] sausage for breakfast.  Lunch is tuna or [place].  Dinner is cabbage, steak, string beans.  She states she is snacking on fruit.  She is eating out 1-2 x a month and may have a burger.  She is drinking 36 ounces of water per day.          Physical Activity/Exercise  Comments:    We talked about exercise.  Patient was given reasons of why exercise is so important and how that can help with their long-term success.  I have encouraged patient to get a support system to help with the activity.    Currently for activity, patient is doing very little stating she has a lot of knee pain.  Goals for chair exercises have been set.      Behavior Modification       Comments:   We also talked about behavior modifications.  We talked about eating triggers, such as eating in front of the TV and solutions, such as making the TV a no eating zone.  If patient is eating out out of emotion, food will only temporarily solve that.  Patient is encouraged to HALT and assess if they are eating because they are Hungry, or out of emotions: Anxious, Lonely, Tired, which the food will only temporarily solve.  We also talked about ways to prevent relapse.    Goals that patient wants to work on includes:  1. Cut out all sugar by next month.  2. Lose 5 pounds by next class.    Adela Ports, MS  RD  11/03/2017

## 2017-11-08 NOTE — Telephone Encounter (Signed)
Normals stress test results given to patient who verbalized understanding.

## 2017-11-10 ENCOUNTER — Encounter: Payer: Self-pay | Admitting: Gastroenterology

## 2017-11-15 DIAGNOSIS — I639 Cerebral infarction, unspecified: Secondary | ICD-10-CM

## 2017-11-15 HISTORY — DX: Cerebral infarction, unspecified: I63.9

## 2017-11-17 ENCOUNTER — Encounter

## 2017-11-17 ENCOUNTER — Inpatient Hospital Stay: Admit: 2017-11-17 | Payer: MEDICARE | Primary: Family Medicine

## 2017-11-17 DIAGNOSIS — R0609 Other forms of dyspnea: Secondary | ICD-10-CM

## 2017-11-27 ENCOUNTER — Inpatient Hospital Stay: Payer: Medicare Other

## 2017-11-27 ENCOUNTER — Emergency Department: Payer: Medicare Other

## 2017-11-27 ENCOUNTER — Inpatient Hospital Stay
Admission: EM | Admit: 2017-11-27 | Discharge: 2017-11-29 | DRG: 069 | Disposition: A | Discharge status: Home Health | Payer: Medicare Other | Attending: Internal Medicine | Admitting: Internal Medicine

## 2017-11-27 ENCOUNTER — Other Ambulatory Visit: Payer: Self-pay

## 2017-11-27 DIAGNOSIS — W19XXXA Unspecified fall, initial encounter: Secondary | ICD-10-CM | POA: Diagnosis not present

## 2017-11-27 DIAGNOSIS — F419 Anxiety disorder, unspecified: Secondary | ICD-10-CM | POA: Diagnosis not present

## 2017-11-27 DIAGNOSIS — I639 Cerebral infarction, unspecified: Secondary | ICD-10-CM | POA: Diagnosis present

## 2017-11-27 DIAGNOSIS — Z8249 Family history of ischemic heart disease and other diseases of the circulatory system: Secondary | ICD-10-CM

## 2017-11-27 DIAGNOSIS — Z87891 Personal history of nicotine dependence: Secondary | ICD-10-CM | POA: Diagnosis not present

## 2017-11-27 DIAGNOSIS — Z885 Allergy status to narcotic agent status: Secondary | ICD-10-CM

## 2017-11-27 DIAGNOSIS — Z66 Do not resuscitate: Secondary | ICD-10-CM | POA: Diagnosis not present

## 2017-11-27 DIAGNOSIS — K219 Gastro-esophageal reflux disease without esophagitis: Secondary | ICD-10-CM | POA: Diagnosis not present

## 2017-11-27 DIAGNOSIS — G8191 Hemiplegia, unspecified affecting right dominant side: Secondary | ICD-10-CM | POA: Diagnosis not present

## 2017-11-27 DIAGNOSIS — D649 Anemia, unspecified: Secondary | ICD-10-CM | POA: Diagnosis not present

## 2017-11-27 DIAGNOSIS — E7800 Pure hypercholesterolemia, unspecified: Secondary | ICD-10-CM | POA: Diagnosis present

## 2017-11-27 DIAGNOSIS — Z79899 Other long term (current) drug therapy: Secondary | ICD-10-CM

## 2017-11-27 DIAGNOSIS — R1310 Dysphagia, unspecified: Secondary | ICD-10-CM | POA: Diagnosis present

## 2017-11-27 DIAGNOSIS — Z888 Allergy status to other drugs, medicaments and biological substances status: Secondary | ICD-10-CM

## 2017-11-27 DIAGNOSIS — Z841 Family history of disorders of kidney and ureter: Secondary | ICD-10-CM

## 2017-11-27 DIAGNOSIS — R29705 NIHSS score 5: Secondary | ICD-10-CM | POA: Diagnosis present

## 2017-11-27 DIAGNOSIS — Z90710 Acquired absence of both cervix and uterus: Secondary | ICD-10-CM

## 2017-11-27 DIAGNOSIS — E890 Postprocedural hypothyroidism: Secondary | ICD-10-CM | POA: Diagnosis present

## 2017-11-27 DIAGNOSIS — M810 Age-related osteoporosis without current pathological fracture: Secondary | ICD-10-CM | POA: Diagnosis not present

## 2017-11-27 DIAGNOSIS — R29810 Facial weakness: Secondary | ICD-10-CM | POA: Diagnosis present

## 2017-11-27 DIAGNOSIS — I10 Essential (primary) hypertension: Secondary | ICD-10-CM | POA: Diagnosis present

## 2017-11-27 DIAGNOSIS — F329 Major depressive disorder, single episode, unspecified: Secondary | ICD-10-CM | POA: Diagnosis present

## 2017-11-27 DIAGNOSIS — Z9049 Acquired absence of other specified parts of digestive tract: Secondary | ICD-10-CM

## 2017-11-27 DIAGNOSIS — Z79890 Hormone replacement therapy (postmenopausal): Secondary | ICD-10-CM

## 2017-11-27 DIAGNOSIS — E785 Hyperlipidemia, unspecified: Secondary | ICD-10-CM | POA: Diagnosis not present

## 2017-11-27 DIAGNOSIS — G459 Transient cerebral ischemic attack, unspecified: Principal | ICD-10-CM | POA: Diagnosis present

## 2017-11-27 DIAGNOSIS — R471 Dysarthria and anarthria: Secondary | ICD-10-CM | POA: Diagnosis present

## 2017-11-27 DIAGNOSIS — Z7982 Long term (current) use of aspirin: Secondary | ICD-10-CM | POA: Diagnosis not present

## 2017-11-27 DIAGNOSIS — R29707 NIHSS score 7: Secondary | ICD-10-CM | POA: Diagnosis not present

## 2017-11-27 LAB — DIFFERENTIAL
Basophils Absolute: 0 10*3/uL (ref 0–0.1)
Basophils Relative: 1 %
Eosinophils Absolute: 0.1 10*3/uL (ref 0–0.7)
Eosinophils Relative: 2 %
Lymphocytes Relative: 33 %
Lymphs Abs: 2.2 10*3/uL (ref 1.0–3.6)
Monocytes Absolute: 0.6 10*3/uL (ref 0.2–0.9)
Monocytes Relative: 9 %
Neutro Abs: 3.6 10*3/uL (ref 1.4–6.5)
Neutrophils Relative %: 55 %

## 2017-11-27 LAB — URINE DRUG SCREEN, QUALITATIVE (ARMC ONLY)
Amphetamines, Ur Screen: NOT DETECTED
Benzodiazepine, Ur Scrn: POSITIVE — AB
Cannabinoid 50 Ng, Ur ~~LOC~~: NOT DETECTED
Cocaine Metabolite,Ur ~~LOC~~: NOT DETECTED
MDMA (Ecstasy)Ur Screen: NOT DETECTED
Methadone Scn, Ur: NOT DETECTED
Opiate, Ur Screen: NOT DETECTED
Phencyclidine (PCP) Ur S: NOT DETECTED
Tricyclic, Ur Screen: NOT DETECTED

## 2017-11-27 LAB — CBC
HCT: 30.6 % — ABNORMAL LOW (ref 35.0–47.0)
Hemoglobin: 10.6 g/dL — ABNORMAL LOW (ref 12.0–16.0)
MCH: 40.1 pg — ABNORMAL HIGH (ref 26.0–34.0)
MCHC: 34.7 g/dL (ref 32.0–36.0)
MCV: 115.4 fL — ABNORMAL HIGH (ref 80.0–100.0)
Platelets: 204 10*3/uL (ref 150–440)
RBC: 2.65 MIL/uL — ABNORMAL LOW (ref 3.80–5.20)
RDW: 16.1 % — ABNORMAL HIGH (ref 11.5–14.5)
WBC: 6.6 10*3/uL (ref 3.6–11.0)

## 2017-11-27 LAB — COMPREHENSIVE METABOLIC PANEL
ALT: 12 U/L (ref 0–44)
AST: 19 U/L (ref 15–41)
Albumin: 3.9 g/dL (ref 3.5–5.0)
Alkaline Phosphatase: 111 U/L (ref 38–126)
Anion gap: 6 (ref 5–15)
BUN: 10 mg/dL (ref 8–23)
CO2: 27 mmol/L (ref 22–32)
Calcium: 8.7 mg/dL — ABNORMAL LOW (ref 8.9–10.3)
Chloride: 107 mmol/L (ref 98–111)
Creatinine, Ser: 1.06 mg/dL — ABNORMAL HIGH (ref 0.44–1.00)
GFR calc Af Amer: >60 mL/min (ref >60)
GFR calc non Af Amer: 53 mL/min — ABNORMAL LOW (ref >60)
Glucose, Bld: 96 mg/dL (ref 70–99)
Potassium: 3.6 mmol/L (ref 3.5–5.1)
Sodium: 140 mmol/L (ref 135–145)
Total Bilirubin: 0.5 mg/dL (ref 0.3–1.2)
Total Protein: 7.4 g/dL (ref 6.5–8.1)

## 2017-11-27 LAB — URINALYSIS, ROUTINE W REFLEX MICROSCOPIC
Bilirubin Urine: NEGATIVE
Glucose, UA: NEGATIVE mg/dL
Hgb urine dipstick: NEGATIVE
Ketones, ur: NEGATIVE mg/dL
Leukocytes, UA: NEGATIVE
Nitrite: NEGATIVE
Protein, ur: NEGATIVE mg/dL
Specific Gravity, Urine: 1.019 (ref 1.005–1.030)
pH: 6 (ref 5.0–8.0)

## 2017-11-27 LAB — ETHANOL: Alcohol, Ethyl (B): <10 mg/dL (ref <10)

## 2017-11-27 LAB — TROPONIN I: Troponin I: <0.03 ng/mL (ref <0.03)

## 2017-11-27 LAB — PROTIME-INR
INR: 0.96
Prothrombin Time: 12.7 seconds (ref 11.4–15.2)

## 2017-11-27 LAB — GLUCOSE, CAPILLARY: Glucose-Capillary: 96 mg/dL (ref 70–99)

## 2017-11-27 LAB — APTT: aPTT: 27 seconds (ref 24–36)

## 2017-11-27 MED ORDER — LACTATED RINGERS IV SOLN
INTRAVENOUS | Status: DC
Start: 2017-11-27 — End: 2017-11-29
  Administered 2017-11-27: 19:00:00 via INTRAVENOUS

## 2017-11-27 MED ORDER — ENOXAPARIN SODIUM 40 MG/0.4ML ~~LOC~~ SOLN
40.0000 mg | SUBCUTANEOUS | Status: DC
  Administered 2017-11-27 – 2017-11-28 (×2): 40 mg via SUBCUTANEOUS
  Filled 2017-11-27 (×2): qty 0.4

## 2017-11-27 MED ORDER — ASPIRIN 300 MG RE SUPP: 300.0000 mg | Freq: Every day | RECTAL | Status: DC

## 2017-11-27 MED ORDER — IOPAMIDOL (ISOVUE-370) INJECTION 76%
100.0000 mL | Freq: Once | INTRAVENOUS | Status: AC | PRN
  Administered 2017-11-27: 100 mL via INTRAVENOUS

## 2017-11-27 MED ORDER — PREDNISOLONE ACETATE 1 % OP SUSP
1.0000 [drp] | Freq: Four times a day (QID) | OPHTHALMIC | Status: DC
  Administered 2017-11-27 – 2017-11-29 (×6): 1 [drp] via OPHTHALMIC
  Filled 2017-11-27: qty 1

## 2017-11-27 MED ORDER — ASPIRIN 300 MG RE SUPP
300.0000 mg | Freq: Once | RECTAL | Status: AC
  Administered 2017-11-27: 300 mg via RECTAL
  Filled 2017-11-27: qty 1

## 2017-11-27 MED ORDER — HYDRALAZINE HCL 20 MG/ML IJ SOLN: 10.0000 mg | Freq: Four times a day (QID) | INTRAMUSCULAR | Status: DC | PRN

## 2017-11-27 MED ORDER — ACETAMINOPHEN 325 MG PO TABS: 650.0000 mg | ORAL_TABLET | ORAL | Status: DC | PRN

## 2017-11-27 MED ORDER — STROKE: EARLY STAGES OF RECOVERY BOOK
Freq: Once | Status: AC
  Administered 2017-11-27: 19:00:00

## 2017-11-27 MED ORDER — ACETAMINOPHEN 650 MG RE SUPP: 650.0000 mg | RECTAL | Status: DC | PRN

## 2017-11-27 MED ORDER — SODIUM CHLORIDE 0.9 % IV SOLN
Freq: Once | INTRAVENOUS | Status: AC
  Administered 2017-11-27: 17:00:00 via INTRAVENOUS

## 2017-11-27 MED ORDER — ACETAMINOPHEN 160 MG/5ML PO SOLN
650.0000 mg | ORAL | Status: DC | PRN
  Filled 2017-11-27: qty 20.3

## 2017-11-27 NOTE — ED Triage Notes (Signed)
3333 notified of code stroke. CT room 1 available. R grip weakness plus aphasia positive for LVO. Last well 0930 am.

## 2017-11-27 NOTE — Consult Note (Signed)
   [facility] Tele[facility]urology Consult Services  Date of service: 11/27/17  Impression:  acute onset right sided weakness - outside the tpa window. Concerning for L frontal vs subcortical infarct. - - -   Not a tpa candidate due to: LKN >4.5 hours.  Presentation is not suggestive of Large Vessel Occlusive Disease. Thrombectomy would not be recommended.   Differential Diagnosis:   1. Cardioembolic stroke  2. Small vessel disease/lacune  3. Thromboembolic, artery-to-artery mechanism  4. Hypercoagulable state-related infarct  5. Transient ischemic attack  6. Thrombotic mechanism, large artery disease   Comments:   Door time:1446 [facility] contacted: [id number] [facility] at bedside: 1519 NIHSS assessment time: 1522  Recommendations:  CTA head/neck with perfusion. ASA DVT proph - lovenox permissive htn PT/OT/speech bedside swallow eval  Inpatient neurology consultation Inpatient stroke evaluation as per Neurology/ Internal Medicine Discussed with ED MD Please call with questions  -----------------------------------------------------------------------------------------  CC R weakness/numbness and slurred speech.   History of Present Illness   Patient is a 67 yo woman with acute onset right weakness/numbness and slurred speech starting at approx 0930. She was working when it started. No LOC/convulsion. No trouble with language. She takes ASA.   Diagnostic:  CT head with attenuation of the L internal capsule, likely old.   Exam: RESULT SUMMARY: 5 points NIH Stroke Scale   INPUTS: 1A: Level of consciousness -> 0 = Alert; keenly responsive 1B: Ask month and age -> 0 = Both questions right 1C: 'Blink eyes' & 'squeeze hands' -> 0 = Performs both tasks 2: Horizontal extraocular movements -> 0 = Normal 3: Visual fields -> 1 = Partial hemianopia 4: Facial palsy -> 1 = Minor paralysis (flat nasolabial fold, smile asymmetry) 5A: Left arm motor drift -> 0 = No  drift for 10 seconds 5B: Right arm motor drift -> 0 = No drift for 10 seconds 6A: Left leg motor drift -> 0 = No drift for 5 seconds 6B: Right leg motor drift -> 2 = Some effort against gravity 7: Limb Ataxia -> 0 = No ataxia 8: Sensation -> 1 = Mild-moderate loss: less sharp/more dull  9: Language/aphasia -> 0 = Normal; no aphasia 10: Dysarthria -> 0 = Normal 11: Extinction/inattention -> 0 = No abnormality   Medical Decision Making:  - Extensive number of diagnosis or management options are considered above.   - Extensive amount of complex data reviewed.   - High risk of complication and/or morbidity or mortality are associated with differential diagnostic considerations above.  - There may be Uncertain outcome and increased probability of prolonged functional impairment or high probability of severe prolonged functional impairment associated with some of these differential diagnosis.  Medical Data Reviewed:  1.Data reviewed include clinical labs, radiology,  Medical Tests;   2.Tests results discussed w/performing or interpreting physician;   3.Obtaining/reviewing old medical records;  4.Obtaining case history from another source;  5.Independent review of image, tracing or specimen.   Patient was informed the Neurology Consult would happen via TeleHealth consult by way of interactive audio and video telecommunications and consented to receiving care in this manner.

## 2017-11-27 NOTE — ED Triage Notes (Signed)
Pt arrives to ED c/o of R face, arm, leg numbness since about 9:30 this AM. Slurred speech noted. R sided hand weakness when compared to grip on L hand. Pt is able to answer question, knows objects. Alert and oriented. Able to move all extremities on own. Unsteady when stands up. No facial droop noted when pt smiles. 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 Takes ASA, denies any other blood thinners.

## 2017-11-27 NOTE — H&P (Signed)
[facility] NAME: Brittany [name]    MR#:  [id number]  DATE OF BIRTH:  19-Apr-1951  DATE OF ADMISSION:  11/27/2017  PRIMARY CARE PHYSICIAN: Derinda Late, MD   REQUESTING/REFERRING PHYSICIAN: Dr. Karma Greaser  CHIEF COMPLAINT:   Chief Complaint  Patient presents with  . Code Stroke    HISTORY OF PRESENT ILLNESS:  Brittany [name]  is a 67 y.o. female with a known history of hypertension, hypothyroidism, hyperlipidemia, GERD presents to the emergency room with right-sided weakness and numbness with dysarthria which started at 9:30 AM.  Patient presented to the emergency room at 2:45 PM.  Code stroke was activated.  She was VAN positive. Patient CT scan of the head showed nothing acute.  CTA of the head and neck was done along with cerebral perfusion study with no large vessel occlusion.  Patient is being admitted for further work-up for acute CVA.  She has had no change of symptoms since morning today.  Has significant weakness on the right side. No prior strokes.  No history of atrial fibrillation or diabetes.  Not on anticoagulants.  Takes baby aspirin daily. Failed swallow screen in the emergency room.  PAST MEDICAL HISTORY:   Past Medical History:  Diagnosis Date  . Anemia   . Anxiety   . Cataract   . Complication of anesthesia   . Depression   . GERD (gastroesophageal reflux disease)   . H/O hiatal hernia   . Hypercholesteremia   . Hypothyroidism   . Osteoporosis   . PONV (postoperative nausea and vomiting)     PAST SURGICAL HISTORY:   Past Surgical History:  Procedure Laterality Date  . ABDOMINAL HYSTERECTOMY  2002   total  . BRAVO [place] STUDY  05/26/2012   Procedure: BRAVO [place];  Surgeon: Lear Ng, MD;  Location: [facility];  Service: Endoscopy;  Laterality: N/A;  . CHOLECYSTECTOMY  2006  . ESOPHAGOGASTRODUODENOSCOPY  05/26/2012   Procedure: ESOPHAGOGASTRODUODENOSCOPY (EGD);  Surgeon: Lear Ng, MD;   Location: [facility];  Service: Endoscopy;  Laterality: N/A;  . ESOPHAGOGASTRODUODENOSCOPY  05/30/2012   Procedure: ESOPHAGOGASTRODUODENOSCOPY (EGD);  Surgeon: Cleotis Nipper, MD;  Location: [facility];  Service: Endoscopy;  Laterality: N/A;  . ESOPHAGOGASTRODUODENOSCOPY N/A 08/31/2012   Procedure: ESOPHAGOGASTRODUODENOSCOPY (EGD);  Surgeon: Wonda Horner, MD;  Location: [facility] ENDOSCOPY;  Service: Endoscopy;  Laterality: N/A;  . EYE SURGERY    . HIP SURGERY  1970s   "hip sunk in"  . NECK SURGERY  2012  . ROTATOR CUFF REPAIR  2013 november   right side  . THYROID SURGERY     nodule removed, right side  . TMJ ARTHROPLASTY  1992  . TUBAL LIGATION  1981    SOCIAL HISTORY:   Social History   Tobacco Use  . Smoking status: Former Smoker    Last attempt to quit: 02/05/2008    Years since quitting: 9.8  Substance Use Topics  . Alcohol use: No    FAMILY HISTORY:   Family History  Problem Relation Age of Onset  . Hypertension Mother   . Kidney disease Mother   . Cancer Sister     DRUG ALLERGIES:   Allergies  Allergen Reactions  . Lithium Anaphylaxis  . Fentanyl Other (See Comments)    Extreme Sedation Other reaction(s): Lethargy (intolerance) Extreme sedation   . Morphine Nausea And Vomiting  . Pseudoephedrine Hypertension    REVIEW OF SYSTEMS:   Review of Systems  Constitutional:  Positive for malaise/fatigue. Negative for chills and fever.  HENT: Negative for sore throat.   Eyes: Negative for blurred vision, double vision and pain.  Respiratory: Negative for cough, hemoptysis, shortness of breath and wheezing.   Cardiovascular: Negative for chest pain, palpitations, orthopnea and leg swelling.  Gastrointestinal: Negative for abdominal pain, constipation, diarrhea, heartburn, nausea and vomiting.  Genitourinary: Negative for dysuria and hematuria.  Musculoskeletal: Negative for back pain and joint pain.  Skin: Negative for rash.  Neurological: Positive for  sensory change, speech change and focal weakness. Negative for headaches.  Endo/Heme/Allergies: Does not bruise/bleed easily.  Psychiatric/Behavioral: Negative for depression. The patient is not nervous/anxious.     MEDICATIONS AT HOME:   Prior to Admission medications   Medication Sig Start Date End Date Taking? Authorizing Provider  aluminum hydroxide-magnesium carbonate (GAVISCON) 95-358 MG/15ML SUSP Take 15 mLs by mouth every 6 (six) hours as needed (for gas).   Yes [provider]  aspirin EC 81 MG tablet Take 81 mg by mouth every evening.   Yes [provider]  calcium carbonate (OS-CAL) 600 MG TABS Take 600 mg by mouth 2 (two) times daily with a meal.   Yes [provider]  citalopram (CELEXA) 40 MG tablet Take 40 mg by mouth every morning.    Yes [provider]  clorazepate (TRANXENE) 7.5 MG tablet Take 7.5 mg by mouth See admin instructions. Take 7.5 mg by mouth in the morning and at bedtime. May take an additional during the day if needed.   Yes [provider]  famotidine (PEPCID) 20 MG tablet Take 20 mg by mouth daily.   Yes [provider]  levothyroxine (SYNTHROID, LEVOTHROID) 25 MCG tablet Take 25 mcg by mouth every morning.    Yes [provider]  loratadine (CLARITIN) 10 MG tablet Take 10 mg by mouth at bedtime.   Yes [provider]  montelukast (SINGULAIR) 10 MG tablet Take 10 mg by mouth at bedtime.   Yes [provider]  Multiple Vitamins-Minerals (WOMENS 50+ MULTI VITAMIN/MIN PO) Take 1 tablet by mouth daily.   Yes [provider]  prednisoLONE acetate (PRED FORTE) 1 % ophthalmic suspension Place 1 drop into the left eye 4 (four) times daily. 10/28/17  Yes [provider]  topiramate (TOPAMAX) 25 MG tablet Take 25 mg by mouth daily. 11/10/17 12/10/17 Yes [provider]  traZODone (DESYREL) 50 MG tablet Take 50 mg by mouth at bedtime.   Yes [provider]   pantoprazole (PROTONIX) 40 MG tablet Take 1 tablet (40 mg total) by mouth daily. Patient not taking: Reported on 11/27/2017 09/02/12   Verlee Monte, MD     VITAL SIGNS:  Blood pressure (!) 118/57, pulse 69, temperature 98.3 F (36.8 C), resp. rate 18, SpO2 99 %.  PHYSICAL EXAMINATION:  Physical Exam  GENERAL:  67 y.o.-year-old patient lying in the bed with no acute distress.  EYES: Pupils equal, round, reactive to light and accommodation. No scleral icterus. Extraocular muscles intact.  HEENT: Head atraumatic, normocephalic. Oropharynx and nasopharynx clear. No oropharyngeal erythema, moist oral mucosa  NECK:  Supple, no jugular venous distention. No thyroid enlargement, no tenderness.  LUNGS: Normal breath sounds bilaterally, no wheezing, rales, rhonchi. No use of accessory muscles of respiration.  CARDIOVASCULAR: S1, S2 normal. No murmurs, rubs, or gallops.  ABDOMEN: Soft, nontender, nondistended. Bowel sounds present. No organomegaly or mass.  EXTREMITIES: No pedal edema, cyanosis, or clubbing. + 2 pedal & radial pulses b/l.   NEUROLOGIC:  Cranial nerves II through XII are intact. Left upper and lower extremity are 5/5.  Right upper and lower extremity 4/5.  Decreased sensations on the right side.  Dysarthria. Decreased motor strengthPSYCHIATRIC: The patient is alert and oriented x 3. Good affect.  SKIN: No obvious rash, lesion, or ulcer.   LABORATORY PANEL:   CBC Recent Labs  Lab 11/27/17 1450  WBC 6.6  HGB 10.6*  HCT 30.6*  PLT 204   ------------------------------------------------------------------------------------------------------------------  Chemistries  Recent Labs  Lab 11/27/17 1450  NA 140  K 3.6  CL 107  CO2 27  GLUCOSE 96  BUN 10  CREATININE 1.06*  CALCIUM 8.7*  AST 19  ALT 12  ALKPHOS 111  BILITOT 0.5   ------------------------------------------------------------------------------------------------------------------  Cardiac Enzymes Recent  Labs  Lab 11/27/17 1450  TROPONINI <0.03   ------------------------------------------------------------------------------------------------------------------  RADIOLOGY:  Ct Angio Head W Or Wo Contrast  Result Date: 11/27/2017 CLINICAL DATA:  Acute onset of right-sided weakness. EXAM: CT ANGIOGRAPHY HEAD AND NECK CT PERFUSION BRAIN TECHNIQUE: Multidetector CT imaging of the head and neck was performed using the standard protocol during bolus administration of intravenous contrast. Multiplanar CT image reconstructions and MIPs were obtained to evaluate the vascular anatomy. Carotid stenosis measurements (when applicable) are obtained utilizing NASCET criteria, using the distal internal carotid diameter as the denominator. Multiphase CT imaging of the brain was performed following IV bolus contrast injection. Subsequent parametric perfusion maps were calculated using RAPID software. CONTRAST:  114mL ISOVUE-370 IOPAMIDOL (ISOVUE-370) INJECTION 76% COMPARISON:  Head CT earlier same day. FINDINGS: CTA NECK FINDINGS Aortic arch: Aortic atherosclerosis. No aneurysm or dissection. Branching pattern of the brachiocephalic vessels from the arch is normal without origin stenosis. Right carotid system: Common carotid artery widely patent to the bifurcation. Soft and calcified plaque at the carotid bifurcation. Minimal diameter of the proximal ICA measures 3 mm. Compared to a more distal cervical ICA diameter of 4 mm, this indicates a 25% stenosis. Left carotid system: Common carotid artery widely patent to the bifurcation region. Primarily calcified plaque at the carotid bifurcation and ICA bulb. No stenosis. Cervical ICA appears normal beyond that. Vertebral arteries: Both vertebral arteries are approximately equal in size. No vertebral origin stenosis. Both appear normal through the cervical region. Skeleton: Previous ACDF C5-6. Other neck: No mass or lymphadenopathy. Previous thyroidectomy on the right. Upper  chest: Emphysema and pulmonary scarring. Review of the MIP images confirms the above findings CTA HEAD FINDINGS Anterior circulation: Both internal carotid arteries are patent through the skull base and siphon regions. No siphon stenosis. The anterior and middle cerebral vessels are patent without proximal stenosis, aneurysm or vascular malformation. Posterior circulation: Both vertebral arteries are widely patent to the basilar. No basilar stenosis. Posterior circulation branch vessels are normal. Venous sinuses: Patent and normal. Anatomic variants: None significant. Delayed phase: No abnormal enhancement. Review of the MIP images confirms the above findings CT Brain Perfusion Findings: CBF (<30%) Volume: 49mL Perfusion (Tmax>6.0s) volume: 23mL Mismatch Volume: 9mL Infarction Location:None IMPRESSION: Negative CT perfusion. Atherosclerotic disease at both carotid bifurcations. 25% stenosis of the proximal ICA on the right. No stenosis on the left. No intracranial finding of significance. Tiny low-density in the anterior limb internal capsule on the left remains indeterminate. This could be an old small vessel insult but I cannot rule out the possibility of a recent small vessel infarction, too small to show up on the perfusion exam. Electronically Signed   By: Nelson Chimes M.D.   On: 11/27/2017 16:24  Ct Angio Neck W Or Wo Contrast  Result Date: 11/27/2017 CLINICAL DATA:  Acute onset of right-sided weakness. EXAM: CT ANGIOGRAPHY HEAD AND NECK CT PERFUSION BRAIN TECHNIQUE: Multidetector CT imaging of the head and neck was performed using the standard protocol during bolus administration of intravenous contrast. Multiplanar CT image reconstructions and MIPs were obtained to evaluate the vascular anatomy. Carotid stenosis measurements (when applicable) are obtained utilizing NASCET criteria, using the distal internal carotid diameter as the denominator. Multiphase CT imaging of the brain was performed following IV  bolus contrast injection. Subsequent parametric perfusion maps were calculated using RAPID software. CONTRAST:  166mL ISOVUE-370 IOPAMIDOL (ISOVUE-370) INJECTION 76% COMPARISON:  Head CT earlier same day. FINDINGS: CTA NECK FINDINGS Aortic arch: Aortic atherosclerosis. No aneurysm or dissection. Branching pattern of the brachiocephalic vessels from the arch is normal without origin stenosis. Right carotid system: Common carotid artery widely patent to the bifurcation. Soft and calcified plaque at the carotid bifurcation. Minimal diameter of the proximal ICA measures 3 mm. Compared to a more distal cervical ICA diameter of 4 mm, this indicates a 25% stenosis. Left carotid system: Common carotid artery widely patent to the bifurcation region. Primarily calcified plaque at the carotid bifurcation and ICA bulb. No stenosis. Cervical ICA appears normal beyond that. Vertebral arteries: Both vertebral arteries are approximately equal in size. No vertebral origin stenosis. Both appear normal through the cervical region. Skeleton: Previous ACDF C5-6. Other neck: No mass or lymphadenopathy. Previous thyroidectomy on the right. Upper chest: Emphysema and pulmonary scarring. Review of the MIP images confirms the above findings CTA HEAD FINDINGS Anterior circulation: Both internal carotid arteries are patent through the skull base and siphon regions. No siphon stenosis. The anterior and middle cerebral vessels are patent without proximal stenosis, aneurysm or vascular malformation. Posterior circulation: Both vertebral arteries are widely patent to the basilar. No basilar stenosis. Posterior circulation branch vessels are normal. Venous sinuses: Patent and normal. Anatomic variants: None significant. Delayed phase: No abnormal enhancement. Review of the MIP images confirms the above findings CT Brain Perfusion Findings: CBF (<30%) Volume: 44mL Perfusion (Tmax>6.0s) volume: 47mL Mismatch Volume: 78mL Infarction Location:None  IMPRESSION: Negative CT perfusion. Atherosclerotic disease at both carotid bifurcations. 25% stenosis of the proximal ICA on the right. No stenosis on the left. No intracranial finding of significance. Tiny low-density in the anterior limb internal capsule on the left remains indeterminate. This could be an old small vessel insult but I cannot rule out the possibility of a recent small vessel infarction, too small to show up on the perfusion exam. Electronically Signed   By: Nelson Chimes M.D.   On: 11/27/2017 16:24   Ct Cerebral Perfusion W Contrast  Result Date: 11/27/2017 CLINICAL DATA:  Acute onset of right-sided weakness. EXAM: CT ANGIOGRAPHY HEAD AND NECK CT PERFUSION BRAIN TECHNIQUE: Multidetector CT imaging of the head and neck was performed using the standard protocol during bolus administration of intravenous contrast. Multiplanar CT image reconstructions and MIPs were obtained to evaluate the vascular anatomy. Carotid stenosis measurements (when applicable) are obtained utilizing NASCET criteria, using the distal internal carotid diameter as the denominator. Multiphase CT imaging of the brain was performed following IV bolus contrast injection. Subsequent parametric perfusion maps were calculated using RAPID software. CONTRAST:  126mL ISOVUE-370 IOPAMIDOL (ISOVUE-370) INJECTION 76% COMPARISON:  Head CT earlier same day. FINDINGS: CTA NECK FINDINGS Aortic arch: Aortic atherosclerosis. No aneurysm or dissection. Branching pattern of the brachiocephalic vessels from the arch is normal without origin stenosis. Right carotid system:  Common carotid artery widely patent to the bifurcation. Soft and calcified plaque at the carotid bifurcation. Minimal diameter of the proximal ICA measures 3 mm. Compared to a more distal cervical ICA diameter of 4 mm, this indicates a 25% stenosis. Left carotid system: Common carotid artery widely patent to the bifurcation region. Primarily calcified plaque at the carotid  bifurcation and ICA bulb. No stenosis. Cervical ICA appears normal beyond that. Vertebral arteries: Both vertebral arteries are approximately equal in size. No vertebral origin stenosis. Both appear normal through the cervical region. Skeleton: Previous ACDF C5-6. Other neck: No mass or lymphadenopathy. Previous thyroidectomy on the right. Upper chest: Emphysema and pulmonary scarring. Review of the MIP images confirms the above findings CTA HEAD FINDINGS Anterior circulation: Both internal carotid arteries are patent through the skull base and siphon regions. No siphon stenosis. The anterior and middle cerebral vessels are patent without proximal stenosis, aneurysm or vascular malformation. Posterior circulation: Both vertebral arteries are widely patent to the basilar. No basilar stenosis. Posterior circulation branch vessels are normal. Venous sinuses: Patent and normal. Anatomic variants: None significant. Delayed phase: No abnormal enhancement. Review of the MIP images confirms the above findings CT Brain Perfusion Findings: CBF (<30%) Volume: 56mL Perfusion (Tmax>6.0s) volume: 46mL Mismatch Volume: 86mL Infarction Location:None IMPRESSION: Negative CT perfusion. Atherosclerotic disease at both carotid bifurcations. 25% stenosis of the proximal ICA on the right. No stenosis on the left. No intracranial finding of significance. Tiny low-density in the anterior limb internal capsule on the left remains indeterminate. This could be an old small vessel insult but I cannot rule out the possibility of a recent small vessel infarction, too small to show up on the perfusion exam. Electronically Signed   By: Nelson Chimes M.D.   On: 11/27/2017 16:24   Ct Head Code Stroke Wo Contrast  Result Date: 11/27/2017 CLINICAL DATA:  Code stroke. Right-sided numbness and slurred speech beginning 0930 hours today. EXAM: CT HEAD WITHOUT CONTRAST TECHNIQUE: Contiguous axial images were obtained from the base of the skull through the  vertex without intravenous contrast. COMPARISON:  MRI 10/27/2012 FINDINGS: Brain: Generalized atrophy. Small area of low-density in the anterior limb internal capsule on the left favored to be old. No evidence of acute small vessel infarction, mass lesion, hemorrhage hydrocephalus or extra-axial collection. Vascular: No abnormal vascular finding. Skull: Negative Sinuses/Orbits: Ethmoid inflammatory changes and frontal inflammatory changes on the right. Orbits negative. Other: None ASPECTS ([place] Stroke Program Early CT Score) - Ganglionic level infarction (caudate, lentiform nuclei, internal capsule, insula, M1-M3 cortex): 6 - Supraganglionic infarction (M4-M6 cortex): 3 Total score (0-10 with 10 being normal): 9 IMPRESSION: 1. Small area of low-density in the anterior limb internal capsule on the left favored to be old. No high suspicion acute finding. Generalized brain atrophy. 2. ASPECTS is 9. 3. These results were called by telephone at the time of interpretation on 11/27/2017 at 3:04 pm to Dr. Harvest Dark , who verbally acknowledged these results. Electronically Signed   By: Nelson Chimes M.D.   On: 11/27/2017 15:06     IMPRESSION AND PLAN:   *Acute CVA with right-sided weakness and sensory changes with dysarthria.  Patient failed swallow screen.  Will be admitted to medical floor with telemetry monitoring.  Neurochecks. Aspirin suppository.  Check fasting lipid profile and hemoglobin A1c.  Check MRI of the brain.  Echocardiogram with bubble study. Neurology consultation.  Consult physical therapy/occupational therapy and speech  *Dysphagia due to acute stroke.  Swallow evaluation. N.p.o. at this  *  Hypertension.  Hold home blood pressure medications.  Ordered IV hydralazine for systolic blood pressure greater than 790 and diastolic blood pressure greater than 120  DVT prophylaxis with Lovenox  All the records are reviewed and case discussed with ED provider. Management plans discussed  with the patient, family and they are in agreement.  CODE STATUS: Full code  TOTAL TIME TAKING CARE OF THIS PATIENT: 40 minutes.   Neita Carp M.D on 11/27/2017 at 4:46 PM  Between 7am to 6pm - Pager - [phone]  After 6pm go to [email or web address] - password [email or web address] [facility]  Office  [phone]  CC: Primary care physician; Derinda Late, MD  Note: This dictation was prepared with Dragon dictation along with smaller phrase technology. Any transcriptional errors that result from this process are unintentional.

## 2017-11-27 NOTE — ED Provider Notes (Signed)
[facility] Emergency Department Provider Note  ____________________________________________   First MD Initiated Contact with Patient 11/27/17 1459     (approximate)  I have reviewed the triage vital signs and the nursing notes.   HISTORY  Chief Complaint Code Stroke  Level 5 caveat:  history/ROS limited by acute/critical illness  HPI Brittany [name] is a 67 y.o. female with medical history as listed below who presents for evaluation of acute onset and severe neurological symptoms including right-sided facial droop, difficulty with speech, difficulty with balance, and weakness on the right side of her body.  She is alert and oriented.  She reports that she was at work at about 930 this morning, cleaning up a mess in the kitchen, when she fell and had acute onset of the symptoms.  She was not sure what was happening but somebody at work was able to help her.  It is  unclear from the history why there was such a delay between the events that happened in her presentation to the emergency department.  Nothing in particular is made her symptoms better or worse and they seem to be about the same now as they were when they first happened.  She is not in any pain but she continues to have weakness and difficulty speaking.  She denies chest pain, shortness of breath, nausea, vomiting, headache.  She has no history of stroke, no history of blood clots in the legs or lungs, and does not take blood thinners except for a daily baby aspirin.  She was identified as a code stroke in triage and sent emergently to the CT scanner for a noncontrast study and then brought to room 2 where I saw her within minutes of being placed in the room while she was been settled in at the teleneurology equipment was being set up.  Past Medical History:  Diagnosis Date  . Anemia   . Anxiety   . Cataract   . Complication of anesthesia   . Depression   . GERD (gastroesophageal reflux disease)     . H/O hiatal hernia   . Hypercholesteremia   . Hypothyroidism   . Osteoporosis   . PONV (postoperative nausea and vomiting)     Patient Active Problem List   Diagnosis Date Noted  . Left-sided weakness 09/01/2012  . Upper GI bleed 08/31/2012  . G E REFLUX 09/24/2008  . PULMONARY NODULE 08/16/2007  . TOBACCO ABUSE 08/15/2007  . Chronic airway obstruction, not elsewhere classified 08/15/2007  . CHEST PAIN, ATYPICAL 08/15/2007    Past Surgical History:  Procedure Laterality Date  . ABDOMINAL HYSTERECTOMY  2002   total  . BRAVO [place] STUDY  05/26/2012   Procedure: BRAVO [place];  Surgeon: Lear Ng, MD;  Location: [facility] ENDOSCOPY;  Service: Endoscopy;  Laterality: N/A;  . CHOLECYSTECTOMY  2006  . ESOPHAGOGASTRODUODENOSCOPY  05/26/2012   Procedure: ESOPHAGOGASTRODUODENOSCOPY (EGD);  Surgeon: Lear Ng, MD;  Location: [facility];  Service: Endoscopy;  Laterality: N/A;  . ESOPHAGOGASTRODUODENOSCOPY  05/30/2012   Procedure: ESOPHAGOGASTRODUODENOSCOPY (EGD);  Surgeon: Cleotis Nipper, MD;  Location: [facility];  Service: Endoscopy;  Laterality: N/A;  . ESOPHAGOGASTRODUODENOSCOPY N/A 08/31/2012   Procedure: ESOPHAGOGASTRODUODENOSCOPY (EGD);  Surgeon: Wonda Horner, MD;  Location: [facility] ENDOSCOPY;  Service: Endoscopy;  Laterality: N/A;  . EYE SURGERY    . HIP SURGERY  1970s   "hip sunk in"  . NECK SURGERY  2012  . ROTATOR CUFF REPAIR  2013 november   right  side  . THYROID SURGERY     nodule removed, right side  . TMJ ARTHROPLASTY  1992  . TUBAL LIGATION  1981    Prior to Admission medications   Medication Sig Start Date End Date Taking? Authorizing Provider  aluminum hydroxide-magnesium carbonate (GAVISCON) 95-358 MG/15ML SUSP Take 15 mLs by mouth every 6 (six) hours as needed (for gas).   Yes [provider]  aspirin EC 81 MG tablet Take 81 mg by mouth every evening.   Yes [provider]  calcium carbonate (OS-CAL) 600 MG TABS Take 600 mg by mouth  2 (two) times daily with a meal.   Yes [provider]  citalopram (CELEXA) 40 MG tablet Take 40 mg by mouth every morning.    Yes [provider]  clorazepate (TRANXENE) 7.5 MG tablet Take 7.5 mg by mouth See admin instructions. Take 7.5 mg by mouth in the morning and at bedtime. May take an additional during the day if needed.   Yes [provider]  famotidine (PEPCID) 20 MG tablet Take 20 mg by mouth daily.   Yes [provider]  levothyroxine (SYNTHROID, LEVOTHROID) 25 MCG tablet Take 25 mcg by mouth every morning.    Yes [provider]  loratadine (CLARITIN) 10 MG tablet Take 10 mg by mouth at bedtime.   Yes [provider]  montelukast (SINGULAIR) 10 MG tablet Take 10 mg by mouth at bedtime.   Yes [provider]  Multiple Vitamins-Minerals (WOMENS 50+ MULTI VITAMIN/MIN PO) Take 1 tablet by mouth daily.   Yes [provider]  prednisoLONE acetate (PRED FORTE) 1 % ophthalmic suspension Place 1 drop into the left eye 4 (four) times daily. 10/28/17  Yes [provider]  topiramate (TOPAMAX) 25 MG tablet Take 25 mg by mouth daily. 11/10/17 12/10/17 Yes [provider]  traZODone (DESYREL) 50 MG tablet Take 50 mg by mouth at bedtime.   Yes [provider]  pantoprazole (PROTONIX) 40 MG tablet Take 1 tablet (40 mg total) by mouth daily. Patient not taking: Reported on 11/27/2017 09/02/12   Verlee Monte, MD    Allergies Lithium; Fentanyl; Morphine; and Pseudoephedrine  Family History  Problem Relation Age of Onset  . Hypertension Mother   . Kidney disease Mother   . Cancer Sister     Social History Social History   Tobacco Use  . Smoking status: Former Smoker    Last attempt to quit: 02/05/2008    Years since quitting: 9.8  Substance Use Topics  . Alcohol use: No  . Drug use: No    Review of Systems Level 5 caveat:  history/ROS limited by acute/critical illness.  Patient denies any pain  but has neurological symptoms as described above.  ____________________________________________   PHYSICAL EXAM:  VITAL SIGNS: ED Triage Vitals  Enc Vitals Group     BP 11/27/17 1452 123/62     Pulse Rate 11/27/17 1452 79     Resp 11/27/17 1452 18     Temp 11/27/17 1452 98.3 F (36.8 C)     Temp Source 11/27/17 1452 Oral     SpO2 11/27/17 1452 98 %     Weight --      Height --      Head Circumference --      Peak Flow --      Pain Score 11/27/17 1501 5     Pain Loc --      Pain Edu? --      Excl.  in [place]? --     Constitutional: Alert and oriented. Well appearing and in no acute distress. Eyes: Conjunctivae are normal. PERRL. EOMI. Head: Atraumatic. Nose: No congestion/rhinnorhea. Mouth/Throat: Mucous membranes are moist. Neck: No stridor.  No meningeal signs.   Cardiovascular: Normal rate, regular rhythm. Good peripheral circulation. Grossly normal heart sounds. Respiratory: Normal respiratory effort.  No retractions. Lungs CTAB. Gastrointestinal: Soft and nontender. No distention.  Musculoskeletal: No lower extremity tenderness nor edema. No gross deformities of extremities. Skin:  Skin is warm, dry and intact. No rash noted. Psychiatric: Mood and affect are normal. Speech and behavior are normal. Neurologic: Slurred and "mushy" speech but appropriate language.  Able to raise right arm to 45 degree angle but unable to maintain it in position for 10 seconds.  Unable to lift the right leg off the bed at all although she is able to engage the muscles.  No left-sided deficits.  Right-sided facial droop.  No aphasia but with dysarthria as described previously.    NIH Stroke Scale  Interval: Baseline Time: 3:10 PM Person Administering Scale: Hinda Kehr  Administer stroke scale items in the order listed. Record performance in each category after each subscale exam. Do not go back and change scores. Follow directions provided for each exam technique. Scores should reflect  what the patient does, not what the clinician thinks the patient can do. The clinician should record answers while administering the exam and work quickly. Except where indicated, the patient should not be coached (i.e., repeated requests to patient to make a special effort).  1a  Level of consciousness: 0=alert; keenly responsive  1b. LOC questions:  0=Performs both tasks correctly  1c. LOC commands: 0=Performs both tasks correctly  2.  Best Gaze: 0=normal  3.  Visual: 0=No visual loss  4. Facial Palsy: 1=Minor paralysis (flattened nasolabial fold, asymmetric on smiling)  5a.  Motor left arm: 0=No drift, limb holds 90 (or 45) degrees for full 10 seconds  5b.  Motor right arm: 1=Drift, limb holds 90 (or 45) degrees but drifts down before full 10 seconds: does not hit bed  6a. motor left leg: 0=No drift, limb holds 90 (or 45) degrees for full 10 seconds  6b  Motor right leg:  2=Some effort against gravity, limb cannot get to or maintain (if cured) 90 (or 45) degrees, drifts down to bed, but has some effort against gravity  7. Limb Ataxia: 0=Absent  8.  Sensory: 0=Normal; no sensory loss  9. Best Language:  0=No aphasia, normal  10. Dysarthria: 1=Mild to moderate, patient slurs at least some words and at worst, can be understood with some difficulty  11. Extinction and Inattention: 0=No abnormality  12. Distal motor function: 0=Normal   Total:   5    ____________________________________________   LABS (all labs ordered are listed, but only abnormal results are displayed)  Labs Reviewed  CBC - Abnormal; Notable for the following components:      Result Value   RBC 2.65 (*)    Hemoglobin 10.6 (*)    HCT 30.6 (*)    MCV 115.4 (*)    MCH 40.1 (*)    RDW 16.1 (*)    All other components within normal limits  COMPREHENSIVE METABOLIC PANEL - Abnormal; Notable for the following components:   Creatinine, Ser 1.06 (*)    Calcium 8.7 (*)    GFR calc non Af Amer 53 (*)    All other  components within normal limits  PROTIME-INR  APTT  DIFFERENTIAL  TROPONIN I  GLUCOSE, CAPILLARY  ETHANOL  URINALYSIS, ROUTINE W REFLEX MICROSCOPIC  URINE DRUG SCREEN, QUALITATIVE ([facility] ONLY)  CBG MONITORING, ED   ____________________________________________  EKG  ED ECG REPORT I, Hinda Kehr, the attending physician, personally viewed and interpreted this ECG.  Date: 11/27/2017 EKG Time: 15: 07 Rate: 70 Rhythm: normal sinus rhythm QRS Axis: normal Intervals: normal ST/T Wave abnormalities: Non-specific ST segment / T-wave changes, but no evidence of acute ischemia. Narrative Interpretation: no evidence of acute ischemia   ____________________________________________  RADIOLOGY I, Hinda Kehr, personally discussed these images and results by phone with the on-call radiologist and used this discussion as part of my medical decision making.   ED MD interpretation: No evidence of acute infarction on noncontrast head CT. No acute findings on perfusion imaging.  Official radiology report(s): Ct Angio Head W Or Wo Contrast  Result Date: 11/27/2017 CLINICAL DATA:  Acute onset of right-sided weakness. EXAM: CT ANGIOGRAPHY HEAD AND NECK CT PERFUSION BRAIN TECHNIQUE: Multidetector CT imaging of the head and neck was performed using the standard protocol during bolus administration of intravenous contrast. Multiplanar CT image reconstructions and MIPs were obtained to evaluate the vascular anatomy. Carotid stenosis measurements (when applicable) are obtained utilizing NASCET criteria, using the distal internal carotid diameter as the denominator. Multiphase CT imaging of the brain was performed following IV bolus contrast injection. Subsequent parametric perfusion maps were calculated using RAPID software. CONTRAST:  190mL ISOVUE-370 IOPAMIDOL (ISOVUE-370) INJECTION 76% COMPARISON:  Head CT earlier same day. FINDINGS: CTA NECK FINDINGS Aortic arch: Aortic atherosclerosis. No aneurysm or  dissection. Branching pattern of the brachiocephalic vessels from the arch is normal without origin stenosis. Right carotid system: Common carotid artery widely patent to the bifurcation. Soft and calcified plaque at the carotid bifurcation. Minimal diameter of the proximal ICA measures 3 mm. Compared to a more distal cervical ICA diameter of 4 mm, this indicates a 25% stenosis. Left carotid system: Common carotid artery widely patent to the bifurcation region. Primarily calcified plaque at the carotid bifurcation and ICA bulb. No stenosis. Cervical ICA appears normal beyond that. Vertebral arteries: Both vertebral arteries are approximately equal in size. No vertebral origin stenosis. Both appear normal through the cervical region. Skeleton: Previous ACDF C5-6. Other neck: No mass or lymphadenopathy. Previous thyroidectomy on the right. Upper chest: Emphysema and pulmonary scarring. Review of the MIP images confirms the above findings CTA HEAD FINDINGS Anterior circulation: Both internal carotid arteries are patent through the skull base and siphon regions. No siphon stenosis. The anterior and middle cerebral vessels are patent without proximal stenosis, aneurysm or vascular malformation. Posterior circulation: Both vertebral arteries are widely patent to the basilar. No basilar stenosis. Posterior circulation branch vessels are normal. Venous sinuses: Patent and normal. Anatomic variants: None significant. Delayed phase: No abnormal enhancement. Review of the MIP images confirms the above findings CT Brain Perfusion Findings: CBF (<30%) Volume: 64mL Perfusion (Tmax>6.0s) volume: 20mL Mismatch Volume: 89mL Infarction Location:None IMPRESSION: Negative CT perfusion. Atherosclerotic disease at both carotid bifurcations. 25% stenosis of the proximal ICA on the right. No stenosis on the left. No intracranial finding of significance. Tiny low-density in the anterior limb internal capsule on the left remains indeterminate.  This could be an old small vessel insult but I cannot rule out the possibility of a recent small vessel infarction, too small to show up on the perfusion exam. Electronically Signed   By: Nelson Chimes M.D.   On: 11/27/2017 16:24   Ct Angio Neck  W Or Wo Contrast  Result Date: 11/27/2017 CLINICAL DATA:  Acute onset of right-sided weakness. EXAM: CT ANGIOGRAPHY HEAD AND NECK CT PERFUSION BRAIN TECHNIQUE: Multidetector CT imaging of the head and neck was performed using the standard protocol during bolus administration of intravenous contrast. Multiplanar CT image reconstructions and MIPs were obtained to evaluate the vascular anatomy. Carotid stenosis measurements (when applicable) are obtained utilizing NASCET criteria, using the distal internal carotid diameter as the denominator. Multiphase CT imaging of the brain was performed following IV bolus contrast injection. Subsequent parametric perfusion maps were calculated using RAPID software. CONTRAST:  137mL ISOVUE-370 IOPAMIDOL (ISOVUE-370) INJECTION 76% COMPARISON:  Head CT earlier same day. FINDINGS: CTA NECK FINDINGS Aortic arch: Aortic atherosclerosis. No aneurysm or dissection. Branching pattern of the brachiocephalic vessels from the arch is normal without origin stenosis. Right carotid system: Common carotid artery widely patent to the bifurcation. Soft and calcified plaque at the carotid bifurcation. Minimal diameter of the proximal ICA measures 3 mm. Compared to a more distal cervical ICA diameter of 4 mm, this indicates a 25% stenosis. Left carotid system: Common carotid artery widely patent to the bifurcation region. Primarily calcified plaque at the carotid bifurcation and ICA bulb. No stenosis. Cervical ICA appears normal beyond that. Vertebral arteries: Both vertebral arteries are approximately equal in size. No vertebral origin stenosis. Both appear normal through the cervical region. Skeleton: Previous ACDF C5-6. Other neck: No mass or  lymphadenopathy. Previous thyroidectomy on the right. Upper chest: Emphysema and pulmonary scarring. Review of the MIP images confirms the above findings CTA HEAD FINDINGS Anterior circulation: Both internal carotid arteries are patent through the skull base and siphon regions. No siphon stenosis. The anterior and middle cerebral vessels are patent without proximal stenosis, aneurysm or vascular malformation. Posterior circulation: Both vertebral arteries are widely patent to the basilar. No basilar stenosis. Posterior circulation branch vessels are normal. Venous sinuses: Patent and normal. Anatomic variants: None significant. Delayed phase: No abnormal enhancement. Review of the MIP images confirms the above findings CT Brain Perfusion Findings: CBF (<30%) Volume: 76mL Perfusion (Tmax>6.0s) volume: 55mL Mismatch Volume: 8mL Infarction Location:None IMPRESSION: Negative CT perfusion. Atherosclerotic disease at both carotid bifurcations. 25% stenosis of the proximal ICA on the right. No stenosis on the left. No intracranial finding of significance. Tiny low-density in the anterior limb internal capsule on the left remains indeterminate. This could be an old small vessel insult but I cannot rule out the possibility of a recent small vessel infarction, too small to show up on the perfusion exam. Electronically Signed   By: Nelson Chimes M.D.   On: 11/27/2017 16:24   Ct Cerebral Perfusion W Contrast  Result Date: 11/27/2017 CLINICAL DATA:  Acute onset of right-sided weakness. EXAM: CT ANGIOGRAPHY HEAD AND NECK CT PERFUSION BRAIN TECHNIQUE: Multidetector CT imaging of the head and neck was performed using the standard protocol during bolus administration of intravenous contrast. Multiplanar CT image reconstructions and MIPs were obtained to evaluate the vascular anatomy. Carotid stenosis measurements (when applicable) are obtained utilizing NASCET criteria, using the distal internal carotid diameter as the denominator.  Multiphase CT imaging of the brain was performed following IV bolus contrast injection. Subsequent parametric perfusion maps were calculated using RAPID software. CONTRAST:  154mL ISOVUE-370 IOPAMIDOL (ISOVUE-370) INJECTION 76% COMPARISON:  Head CT earlier same day. FINDINGS: CTA NECK FINDINGS Aortic arch: Aortic atherosclerosis. No aneurysm or dissection. Branching pattern of the brachiocephalic vessels from the arch is normal without origin stenosis. Right carotid system: Common carotid artery  widely patent to the bifurcation. Soft and calcified plaque at the carotid bifurcation. Minimal diameter of the proximal ICA measures 3 mm. Compared to a more distal cervical ICA diameter of 4 mm, this indicates a 25% stenosis. Left carotid system: Common carotid artery widely patent to the bifurcation region. Primarily calcified plaque at the carotid bifurcation and ICA bulb. No stenosis. Cervical ICA appears normal beyond that. Vertebral arteries: Both vertebral arteries are approximately equal in size. No vertebral origin stenosis. Both appear normal through the cervical region. Skeleton: Previous ACDF C5-6. Other neck: No mass or lymphadenopathy. Previous thyroidectomy on the right. Upper chest: Emphysema and pulmonary scarring. Review of the MIP images confirms the above findings CTA HEAD FINDINGS Anterior circulation: Both internal carotid arteries are patent through the skull base and siphon regions. No siphon stenosis. The anterior and middle cerebral vessels are patent without proximal stenosis, aneurysm or vascular malformation. Posterior circulation: Both vertebral arteries are widely patent to the basilar. No basilar stenosis. Posterior circulation branch vessels are normal. Venous sinuses: Patent and normal. Anatomic variants: None significant. Delayed phase: No abnormal enhancement. Review of the MIP images confirms the above findings CT Brain Perfusion Findings: CBF (<30%) Volume: 34mL Perfusion (Tmax>6.0s)  volume: 73mL Mismatch Volume: 69mL Infarction Location:None IMPRESSION: Negative CT perfusion. Atherosclerotic disease at both carotid bifurcations. 25% stenosis of the proximal ICA on the right. No stenosis on the left. No intracranial finding of significance. Tiny low-density in the anterior limb internal capsule on the left remains indeterminate. This could be an old small vessel insult but I cannot rule out the possibility of a recent small vessel infarction, too small to show up on the perfusion exam. Electronically Signed   By: Nelson Chimes M.D.   On: 11/27/2017 16:24   Ct Head Code Stroke Wo Contrast  Result Date: 11/27/2017 CLINICAL DATA:  Code stroke. Right-sided numbness and slurred speech beginning 0930 hours today. EXAM: CT HEAD WITHOUT CONTRAST TECHNIQUE: Contiguous axial images were obtained from the base of the skull through the vertex without intravenous contrast. COMPARISON:  MRI 10/27/2012 FINDINGS: Brain: Generalized atrophy. Small area of low-density in the anterior limb internal capsule on the left favored to be old. No evidence of acute small vessel infarction, mass lesion, hemorrhage hydrocephalus or extra-axial collection. Vascular: No abnormal vascular finding. Skull: Negative Sinuses/Orbits: Ethmoid inflammatory changes and frontal inflammatory changes on the right. Orbits negative. Other: None ASPECTS ([place] Stroke Program Early CT Score) - Ganglionic level infarction (caudate, lentiform nuclei, internal capsule, insula, M1-M3 cortex): 6 - Supraganglionic infarction (M4-M6 cortex): 3 Total score (0-10 with 10 being normal): 9 IMPRESSION: 1. Small area of low-density in the anterior limb internal capsule on the left favored to be old. No high suspicion acute finding. Generalized brain atrophy. 2. ASPECTS is 9. 3. These results were called by telephone at the time of interpretation on 11/27/2017 at 3:04 pm to Dr. Harvest Dark , who verbally acknowledged these results. Electronically  Signed   By: Nelson Chimes M.D.   On: 11/27/2017 15:06    ____________________________________________   PROCEDURES  Critical Care performed: Yes, see critical care procedure note(s)   Procedure(s) performed:   .Critical Care Performed by: Hinda Kehr, MD Authorized by: Hinda Kehr, MD   Critical care provider statement:    Critical care time (minutes):  30   Critical care time was exclusive of:  Separately billable procedures and treating other patients   Critical care was necessary to treat or prevent imminent or life-threatening deterioration of  the following conditions:  CNS failure or compromise   Critical care was time spent personally by me on the following activities:  Development of treatment plan with patient or surrogate, discussions with consultants, evaluation of patient's response to treatment, examination of patient, obtaining history from patient or surrogate, ordering and performing treatments and interventions, ordering and review of laboratory studies, ordering and review of radiographic studies, pulse oximetry, re-evaluation of patient's condition and review of old charts     ____________________________________________   INITIAL IMPRESSION / ASSESSMENT AND PLAN / ED COURSE  As part of my medical decision making, I reviewed the following data within the [place] notes reviewed and incorporated, Labs reviewed , EKG interpreted , Old chart reviewed, Discussed with admitting physician , Discussed with radiologist and A consult was requested and obtained from this/these consultant(s) Neurology    I saw the patient shortly after she was placed in the room and determined she has an NIH stroke scale of 5 as described above.  However she is right at the 6-hour mark from last known normal.  We started the tele-neurology code stroke process and the nurse representative ask if we wanted to proceed with code stroke or instead obtain a neurology  consult, and I stated that I prefer to proceed as a code stroke given that we do not know if this could be intervene a bowl (as in the case of LVO) and because we are so close to the TPA window.  The patient is hemodynamically stable with no obvious acute findings on noncontrast head CT per radiology.  Awaiting tele-neurology evaluation.  Clinical Course as of Nov 27 1633  Sat Nov 27, 2017  1520 Teleneurologist has logged in to the computer, within about 10 minutes of activating the code stroke.   [CF]  1533 Creatinine(!): 1.06 [CF]  1534 Discussed the case via video conferencing with Dr. Denzil Magnuson the tele-neurologist.  He agrees the patient is not a candidate for TPA but that we should proceed with CT angiography to rule out large vessel occlusion and to see if there is anything requiring intervention.  As per his recommendation I am proceeding with CTA head, CTA neck, and CT head perfusion study.  [name] creatinine is normal and she should be appropriate for IV contrast.   [CF]  1547 As per ED RN Becky Sax, the patient failed the ED RN swallow screen.  She will need rectal aspirin, but we will wait until imaging has been completed.   [CF]  5573 No acute/intervenable findings on CTA head, CTA neck, and CT perfusion study.  Giving ASA 300 PR, ordered NS 100 mL/hr maintenance fluids, and discussed case in person with Dr. Darvin Neighbours with the hospitalist service who will admit.   [CF]    Clinical Course User Index [CF] Hinda Kehr, MD    ____________________________________________  FINAL CLINICAL IMPRESSION(S) / ED DIAGNOSES  Final diagnoses:  Acute CVA (cerebrovascular accident) [facility])     MEDICATIONS GIVEN DURING THIS VISIT:  Medications  aspirin suppository 300 mg (has no administration in time range)  iopamidol (ISOVUE-370) 76 % injection 100 mL (100 mLs Intravenous Contrast Given 11/27/17 1554)  0.9 %  sodium chloride infusion ( Intravenous New Bag/Given 11/27/17 1635)     ED  Discharge Orders    None       Note:  This document was prepared using Dragon voice recognition software and may include unintentional dictation errors.    Hinda Kehr, MD 11/27/17 [phone]

## 2017-11-27 NOTE — ED Notes (Signed)
Yellow socks and yellow armband in place for high fall risk.

## 2017-11-27 NOTE — Progress Notes (Signed)
CODE STROKE- PHARMACY COMMUNICATION   Time CODE STROKE called/page received:@1455   Time response to CODE STROKE was made (in person or via phone): @1505   Time Stroke Kit retrieved from [facility] (only if needed):NA  Name of Provider/Nurse contacted:Sonja  Past Medical History:  Diagnosis Date  . Anemia   . Anxiety   . Cataract   . Complication of anesthesia   . Depression   . GERD (gastroesophageal reflux disease)   . H/O hiatal hernia   . Hypercholesteremia   . Hypothyroidism   . Osteoporosis   . PONV (postoperative nausea and vomiting)    Prior to Admission medications   Medication Sig Start Date End Date Taking? Authorizing Provider  aluminum hydroxide-magnesium carbonate (GAVISCON) 95-358 MG/15ML SUSP Take 15 mLs by mouth every 6 (six) hours as needed (for gas).   Yes [provider]  aspirin EC 81 MG tablet Take 81 mg by mouth every evening.   Yes [provider]  calcium carbonate (OS-CAL) 600 MG TABS Take 600 mg by mouth 2 (two) times daily with a meal.   Yes [provider]  citalopram (CELEXA) 40 MG tablet Take 40 mg by mouth every morning.    Yes [provider]  clorazepate (TRANXENE) 7.5 MG tablet Take 7.5 mg by mouth See admin instructions. Take 7.5 mg by mouth in the morning and at bedtime. May take an additional during the day if needed.   Yes [provider]  famotidine (PEPCID) 20 MG tablet Take 20 mg by mouth daily.   Yes [provider]  levothyroxine (SYNTHROID, LEVOTHROID) 25 MCG tablet Take 25 mcg by mouth every morning.    Yes [provider]  loratadine (CLARITIN) 10 MG tablet Take 10 mg by mouth at bedtime.   Yes [provider]  montelukast (SINGULAIR) 10 MG tablet Take 10 mg by mouth at bedtime.   Yes [provider]  Multiple Vitamins-Minerals (WOMENS 50+ MULTI VITAMIN/MIN PO) Take 1 tablet by mouth daily.   Yes [provider]  prednisoLONE acetate (PRED FORTE) 1 %  ophthalmic suspension Place 1 drop into the left eye 4 (four) times daily. 10/28/17  Yes [provider]  topiramate (TOPAMAX) 25 MG tablet Take 25 mg by mouth daily. 11/10/17 12/10/17 Yes [provider]  traZODone (DESYREL) 50 MG tablet Take 50 mg by mouth at bedtime.   Yes [provider]  pantoprazole (PROTONIX) 40 MG tablet Take 1 tablet (40 mg total) by mouth daily. Patient not taking: Reported on 11/27/2017 09/02/12   Verlee Monte, MD    Pernell Dupre, PharmD, BCPS Clinical Pharmacist 11/27/2017 3:27 PM

## 2017-11-27 NOTE — Progress Notes (Signed)
Advance care planning  Purpose of Encounter Acute stroke prognosis, CODE STATUS discussion  Parties in Attendance Patient and son at bedside  Patients Decisional capacity Patient is alert and oriented.  Has dysarthria but able to participate in discussion.  Patient does not have a designated healthcare power of attorney.  Wants both her sons to make decisions if she cannot.  Discussed regarding acute stroke, treatment plan and prognosis.  Patient is hopeful her symptoms will improve with time and therapy. Discussed regarding CODE STATUS and explained intubation and CPR.  Patient wants to be DO NOT RESUSCITATE and DO NOT INTUBATE.  Son at bedside is aware of this decision.  Orders entered.  Time spent -17 minutes

## 2017-11-27 NOTE — Progress Notes (Signed)
Pt just arriving with MRI calling for pt. VS's taken. Telemetry verified. Family at bedside. Pt alert, verbal and appropriate.  Pt immediately transported to MRI. Unable to complete neuro checks/NIH, admission profile.

## 2017-11-27 NOTE — Progress Notes (Signed)
Pt returned from MRI- stable; family at bedside. Report given to [facility], RN and she will no neuro asessement and NIH.

## 2017-11-27 NOTE — Progress Notes (Signed)
Chaplain responded to a code stroke. Pt wa being wheeled in alert and aware. Chaplain checked for family was told friend was in the waiting area. Chaplain took the wheelchair back to waiting area and looked for friend(Doris). She was not there. Chaplain asked attendant to send friend back upon arrival. Chaplain reported to care team and Pt. There were no other needs.    11/27/17 1500  Clinical Encounter Type  Visited With Patient  Visit Type Initial;Code  Referral From Care management

## 2017-11-27 NOTE — ED Notes (Signed)
Dr. Karma Greaser at bedside. teleneuro cart activated at this time.

## 2017-11-28 ENCOUNTER — Inpatient Hospital Stay (HOSPITAL_COMMUNITY)
Admit: 2017-11-28 | Discharge: 2017-11-28 | Disposition: A | Discharge status: Home / Self Care | Payer: Medicare Other | Attending: Internal Medicine | Admitting: Internal Medicine

## 2017-11-28 DIAGNOSIS — G459 Transient cerebral ischemic attack, unspecified: Secondary | ICD-10-CM | POA: Diagnosis not present

## 2017-11-28 DIAGNOSIS — I361 Nonrheumatic tricuspid (valve) insufficiency: Secondary | ICD-10-CM | POA: Diagnosis not present

## 2017-11-28 DIAGNOSIS — I639 Cerebral infarction, unspecified: Secondary | ICD-10-CM | POA: Diagnosis not present

## 2017-11-28 LAB — LIPID PANEL
Cholesterol: 180 mg/dL (ref 0–200)
HDL: 31 mg/dL — ABNORMAL LOW (ref >40)
LDL Cholesterol: 123 mg/dL — ABNORMAL HIGH (ref 0–99)
Total CHOL/HDL Ratio: 5.8 RATIO
Triglycerides: 128 mg/dL (ref <150)
VLDL: 26 mg/dL (ref 0–40)

## 2017-11-28 LAB — HEMOGLOBIN A1C
Hgb A1c MFr Bld: 5.7 % — ABNORMAL HIGH (ref 4.8–5.6)
Mean Plasma Glucose: 116.89 mg/dL

## 2017-11-28 MED ORDER — LORATADINE 10 MG PO TABS
10.0000 mg | ORAL_TABLET | Freq: Every day | ORAL | Status: DC
  Administered 2017-11-28: 23:00:00 10 mg via ORAL
  Filled 2017-11-28: qty 1

## 2017-11-28 MED ORDER — ASPIRIN EC 81 MG PO TBEC
81.0000 mg | DELAYED_RELEASE_TABLET | Freq: Every evening | ORAL | Status: DC
  Administered 2017-11-28: 23:00:00 81 mg via ORAL
  Filled 2017-11-28: qty 1

## 2017-11-28 MED ORDER — LEVOTHYROXINE SODIUM 25 MCG PO TABS: 25.0000 ug | ORAL_TABLET | Freq: Every day | ORAL | Status: DC

## 2017-11-28 MED ORDER — FAMOTIDINE 20 MG PO TABS
20.0000 mg | ORAL_TABLET | Freq: Every day | ORAL | Status: DC
  Administered 2017-11-29: 20 mg via ORAL
  Filled 2017-11-28: qty 1

## 2017-11-28 MED ORDER — CLORAZEPATE DIPOTASSIUM 7.5 MG PO TABS
7.5000 mg | ORAL_TABLET | Freq: Two times a day (BID) | ORAL | Status: DC
  Administered 2017-11-28 – 2017-11-29 (×2): 7.5 mg via ORAL
  Filled 2017-11-28: qty 1

## 2017-11-28 MED ORDER — TOPIRAMATE 25 MG PO TABS
25.0000 mg | ORAL_TABLET | Freq: Every day | ORAL | Status: DC
  Administered 2017-11-29: 09:00:00 25 mg via ORAL
  Filled 2017-11-28 (×2): qty 1

## 2017-11-28 MED ORDER — MONTELUKAST SODIUM 10 MG PO TABS
10.0000 mg | ORAL_TABLET | Freq: Every day | ORAL | Status: DC
  Administered 2017-11-28: 23:00:00 10 mg via ORAL
  Filled 2017-11-28: qty 1

## 2017-11-28 MED ORDER — TRAZODONE HCL 50 MG PO TABS
50.0000 mg | ORAL_TABLET | Freq: Every day | ORAL | Status: DC
  Administered 2017-11-28: 23:00:00 50 mg via ORAL
  Filled 2017-11-28: qty 1

## 2017-11-28 MED ORDER — CLORAZEPATE DIPOTASSIUM 7.5 MG PO TABS: 7.5000 mg | ORAL_TABLET | Freq: Every day | ORAL | Status: DC | PRN

## 2017-11-28 MED ORDER — LEVOTHYROXINE SODIUM 25 MCG PO TABS
25.0000 ug | ORAL_TABLET | Freq: Every day | ORAL | Status: DC
  Administered 2017-11-29: 10:00:00 25 ug via ORAL
  Filled 2017-11-28: qty 1

## 2017-11-28 MED ORDER — CITALOPRAM HYDROBROMIDE 20 MG PO TABS
40.0000 mg | ORAL_TABLET | Freq: Two times a day (BID) | ORAL | Status: DC
  Administered 2017-11-28 – 2017-11-29 (×2): 40 mg via ORAL
  Filled 2017-11-28 (×2): qty 2

## 2017-11-28 NOTE — Consult Note (Signed)
Referring Physician: Pyreddy    Chief Complaint: Right sided weakness, dysarthria  HPI: Brittany [name] is an 67 y.o. female who reports that she awakened at baseline on yesterday.  Went to work and began to feel dizzy.  Feels her speech was slurred as well.  Has a difficult giving the history beyond that point.  It appears that she presented with right hemiparesis and dysarthria.  Symptoms have improved.   Initial NIHSS of 7.  Date last known well: Date: 11/27/2017 Time last known well: Time: 09:30 tPA Given: No: Outside tim window  Past Medical History:  Diagnosis Date  . Anemia   . Anxiety   . Cataract   . Complication of anesthesia   . Depression   . GERD (gastroesophageal reflux disease)   . H/O hiatal hernia   . Hypercholesteremia   . Hypothyroidism   . Osteoporosis   . PONV (postoperative nausea and vomiting)     Past Surgical History:  Procedure Laterality Date  . ABDOMINAL HYSTERECTOMY  2002   total  . BRAVO [place] STUDY  05/26/2012   Procedure: BRAVO [place];  Surgeon: Lear Ng, MD;  Location: [facility];  Service: Endoscopy;  Laterality: N/A;  . CHOLECYSTECTOMY  2006  . ESOPHAGOGASTRODUODENOSCOPY  05/26/2012   Procedure: ESOPHAGOGASTRODUODENOSCOPY (EGD);  Surgeon: Lear Ng, MD;  Location: [facility];  Service: Endoscopy;  Laterality: N/A;  . ESOPHAGOGASTRODUODENOSCOPY  05/30/2012   Procedure: ESOPHAGOGASTRODUODENOSCOPY (EGD);  Surgeon: Cleotis Nipper, MD;  Location: [facility];  Service: Endoscopy;  Laterality: N/A;  . ESOPHAGOGASTRODUODENOSCOPY N/A 08/31/2012   Procedure: ESOPHAGOGASTRODUODENOSCOPY (EGD);  Surgeon: Wonda Horner, MD;  Location: [facility] ENDOSCOPY;  Service: Endoscopy;  Laterality: N/A;  . EYE SURGERY    . HIP SURGERY  1970s   "hip sunk in"  . NECK SURGERY  2012  . ROTATOR CUFF REPAIR  2013 november   right side  . THYROID SURGERY     nodule removed, right side  . TMJ ARTHROPLASTY  1992  . TUBAL LIGATION  1981    Family  History  Problem Relation Age of Onset  . Hypertension Mother   . Kidney disease Mother   . Cancer Sister    Social History:  reports that she quit smoking about 9 years ago. She has never used smokeless tobacco. She reports that she does not drink alcohol or use drugs.  Allergies:  Allergies  Allergen Reactions  . Lithium Anaphylaxis  . Fentanyl Other (See Comments)    Extreme Sedation Other reaction(s): Lethargy (intolerance) Extreme sedation   . Morphine Nausea And Vomiting  . Pseudoephedrine Hypertension    Medications:  I have reviewed the patient's current medications. Prior to Admission:  Medications Prior to Admission  Medication Sig Dispense Refill Last Dose  . aluminum hydroxide-magnesium carbonate (GAVISCON) 95-358 MG/15ML SUSP Take 15 mLs by mouth every 6 (six) hours as needed (for gas).   PRN at PRN  . aspirin EC 81 MG tablet Take 81 mg by mouth every evening.   11/26/2017 at 2000  . calcium carbonate (OS-CAL) 600 MG TABS Take 600 mg by mouth 2 (two) times daily with a meal.   Unknown at Unknown  . citalopram (CELEXA) 40 MG tablet Take 40 mg by mouth every morning.    11/27/2017 at 0700  . clorazepate (TRANXENE) 7.5 MG tablet Take 7.5 mg by mouth See admin instructions. Take 7.5 mg by mouth in the morning and at bedtime. May take an additional during the day if  needed.   11/27/2017 at 0700  . famotidine (PEPCID) 20 MG tablet Take 20 mg by mouth daily.   11/27/2017 at 0700  . levothyroxine (SYNTHROID, LEVOTHROID) 25 MCG tablet Take 25 mcg by mouth every morning.    11/27/2017 at 0700  . loratadine (CLARITIN) 10 MG tablet Take 10 mg by mouth at bedtime.   11/26/2017 at 2000  . montelukast (SINGULAIR) 10 MG tablet Take 10 mg by mouth at bedtime.   11/26/2017 at 2000  . Multiple Vitamins-Minerals (WOMENS 50+ MULTI VITAMIN/MIN PO) Take 1 tablet by mouth daily.   11/26/2017 at Unknown time  . prednisoLONE acetate (PRED FORTE) 1 % ophthalmic suspension Place 1 drop into the left eye  4 (four) times daily.   Past Week at Unknown time  . topiramate (TOPAMAX) 25 MG tablet Take 25 mg by mouth daily.   11/26/2017 at Unknown time  . traZODone (DESYREL) 50 MG tablet Take 50 mg by mouth at bedtime.   11/26/2017 at 2100  . pantoprazole (PROTONIX) 40 MG tablet Take 1 tablet (40 mg total) by mouth daily. (Patient not taking: Reported on 11/27/2017) 30 tablet 0 Not Taking at Unknown time   Scheduled: . aspirin  300 mg Rectal Daily  . enoxaparin (LOVENOX) injection  40 mg Subcutaneous Q24H  . prednisoLONE acetate  1 drop Left Eye QID    ROS: History obtained from the patient  General ROS: negative for - chills, fatigue, fever, night sweats, weight gain or weight loss Psychological ROS: negative for - behavioral disorder, hallucinations, memory difficulties, mood swings or suicidal ideation Ophthalmic ROS: blind left eye ENT ROS: negative for - epistaxis, nasal discharge, oral lesions, sore throat, tinnitus or vertigo Allergy and Immunology ROS: negative for - hives or itchy/watery eyes Hematological and Lymphatic ROS: negative for - bleeding problems, bruising or swollen lymph nodes Endocrine ROS: negative for - galactorrhea, hair pattern changes, polydipsia/polyuria or temperature intolerance Respiratory ROS: negative for - cough, hemoptysis, shortness of breath or wheezing Cardiovascular ROS: negative for - chest pain, dyspnea on exertion, edema or irregular heartbeat Gastrointestinal ROS: negative for - abdominal pain, diarrhea, hematemesis, nausea/vomiting or stool incontinence Genito-Urinary ROS: negative for - dysuria, hematuria, incontinence or urinary frequency/urgency Musculoskeletal ROS: right foot pain Neurological ROS: as noted in HPI Dermatological ROS: negative for rash and skin lesion changes  Physical Examination: Blood pressure 120/73, pulse 67, temperature 97.8 F (36.6 C), temperature source Oral, resp. rate 18, height 5' 5.5" (1.664 m), weight 68.3 kg (150 lb  9.2 oz), SpO2 94 %.  HEENT-  Normocephalic, no lesions, without obvious abnormality.  Normal external eye and conjunctiva.  Normal TM's bilaterally.  Normal auditory canals and external ears. Normal external nose, mucus membranes and septum.  Normal pharynx. Cardiovascular- S1, S2 normal, pulses palpable throughout   Lungs- chest clear, no wheezing, rales, normal symmetric air entry Abdomen- soft, non-tender; bowel sounds normal; no masses,  no organomegaly Extremities- no edema Lymph-no adenopathy palpable Musculoskeletal-no joint tenderness, deformity or swelling Skin-warm and dry, no hyperpigmentation, vitiligo, or suspicious lesions  Neurological Examination   Mental Status: Alert, oriented, thought content appropriate.  Speech mildly dysarthric which per visitors is baseline.  Able to follow 3 step commands without difficulty. Cranial Nerves: II: Discs flat bilaterally; Visual fields grossly normal in the right eye III,IV, VI: ptosis not present, extra-ocular motions intact bilaterally V,VII: mild decrease in right NLF, facial light touch sensation decreased on the right VIII: hearing normal bilaterally IX,X: gag reflex present XI: bilateral shoulder shrug  XII: midline tongue extension Motor: Patient with give-way weakness in all extremities.  When both sided tested simultaneously no focal weakness noted.  When each extremity tested in isolation patient with 4/5 strength on the right Sensory: Pinprick and light touch intact decreased in the RUE Deep Tendon Reflexes: 2+ and symmetric with absent AJ's bilaterally Plantars: Right: mute   Left: mute Cerebellar: Normal finger-to-nose and normal heel-to-shin testing bilaterally Gait: not tested due to safety concerns    Laboratory Studies:  Basic Metabolic Panel: Recent Labs  Lab 11/27/17 1450  NA 140  K 3.6  CL 107  CO2 27  GLUCOSE 96  BUN 10  CREATININE 1.06*  CALCIUM 8.7*    Liver Function Tests: Recent Labs  Lab  11/27/17 1450  AST 19  ALT 12  ALKPHOS 111  BILITOT 0.5  PROT 7.4  ALBUMIN 3.9   No results for input(s): LIPASE, AMYLASE in the last 168 hours. No results for input(s): AMMONIA in the last 168 hours.  CBC: Recent Labs  Lab 11/27/17 1450  WBC 6.6  NEUTROABS 3.6  HGB 10.6*  HCT 30.6*  MCV 115.4*  PLT 204    Cardiac Enzymes: Recent Labs  Lab 11/27/17 1450  TROPONINI <0.03    BNP: Invalid input(s): POCBNP  CBG: Recent Labs  Lab 11/27/17 1451  GLUCAP 96    Microbiology: Results for orders placed or performed during the hospital encounter of 11/23/16  Gastrointestinal Panel by PCR , Stool     Status: None   Collection Time: 11/23/16  7:44 AM  Result Value Ref Range Status   Campylobacter species NOT DETECTED NOT DETECTED Final   Plesimonas shigelloides NOT DETECTED NOT DETECTED Final   Salmonella species NOT DETECTED NOT DETECTED Final   Yersinia enterocolitica NOT DETECTED NOT DETECTED Final   Vibrio species NOT DETECTED NOT DETECTED Final   Vibrio cholerae NOT DETECTED NOT DETECTED Final   Enteroaggregative E coli (EAEC) NOT DETECTED NOT DETECTED Final   Enteropathogenic E coli (EPEC) NOT DETECTED NOT DETECTED Final   Enterotoxigenic E coli (ETEC) NOT DETECTED NOT DETECTED Final   Shiga like toxin producing E coli (STEC) NOT DETECTED NOT DETECTED Final   Shigella/Enteroinvasive E coli (EIEC) NOT DETECTED NOT DETECTED Final   Cryptosporidium NOT DETECTED NOT DETECTED Final   Cyclospora cayetanensis NOT DETECTED NOT DETECTED Final   Entamoeba histolytica NOT DETECTED NOT DETECTED Final   Giardia lamblia NOT DETECTED NOT DETECTED Final   Adenovirus F40/41 NOT DETECTED NOT DETECTED Final   Astrovirus NOT DETECTED NOT DETECTED Final   Norovirus GI/GII NOT DETECTED NOT DETECTED Final   Rotavirus A NOT DETECTED NOT DETECTED Final   Sapovirus (I, II, IV, and V) NOT DETECTED NOT DETECTED Final    Coagulation Studies: Recent Labs    11/27/17 1450  LABPROT  12.7  INR 0.96    Urinalysis:  Recent Labs  Lab 11/27/17 1700  COLORURINE COLORLESS*  LABSPEC 1.019  PHURINE 6.0  GLUCOSEU NEGATIVE  HGBUR NEGATIVE  BILIRUBINUR NEGATIVE  KETONESUR NEGATIVE  PROTEINUR NEGATIVE  NITRITE NEGATIVE  LEUKOCYTESUR NEGATIVE    Lipid Panel:    Component Value Date/Time   CHOL 180 11/28/2017 0519   TRIG 128 11/28/2017 0519   HDL 31 (L) 11/28/2017 0519   CHOLHDL 5.8 11/28/2017 0519   VLDL 26 11/28/2017 0519   LDLCALC 123 (H) 11/28/2017 0519    HgbA1C:  Lab Results  Component Value Date   HGBA1C 5.7 (H) 11/28/2017    Urine Drug Screen:  Component Value Date/Time   LABOPIA NONE DETECTED 11/27/2017 1700   COCAINSCRNUR NONE DETECTED 11/27/2017 1700   LABBENZ POSITIVE (A) 11/27/2017 1700   AMPHETMU NONE DETECTED 11/27/2017 1700   THCU NONE DETECTED 11/27/2017 1700   LABBARB (A) 11/27/2017 1700    Result not available. Reagent lot number recalled by manufacturer.    Alcohol Level:  Recent Labs  Lab 11/27/17 [place] <10    Other results: EKG: sinus rhythm at 70 bpm.  Imaging: Ct Angio Head W Or Wo Contrast  Result Date: 11/27/2017 CLINICAL DATA:  Acute onset of right-sided weakness. EXAM: CT ANGIOGRAPHY HEAD AND NECK CT PERFUSION BRAIN TECHNIQUE: Multidetector CT imaging of the head and neck was performed using the standard protocol during bolus administration of intravenous contrast. Multiplanar CT image reconstructions and MIPs were obtained to evaluate the vascular anatomy. Carotid stenosis measurements (when applicable) are obtained utilizing NASCET criteria, using the distal internal carotid diameter as the denominator. Multiphase CT imaging of the brain was performed following IV bolus contrast injection. Subsequent parametric perfusion maps were calculated using RAPID software. CONTRAST:  145mL ISOVUE-370 IOPAMIDOL (ISOVUE-370) INJECTION 76% COMPARISON:  Head CT earlier same day. FINDINGS: CTA NECK FINDINGS Aortic arch: Aortic  atherosclerosis. No aneurysm or dissection. Branching pattern of the brachiocephalic vessels from the arch is normal without origin stenosis. Right carotid system: Common carotid artery widely patent to the bifurcation. Soft and calcified plaque at the carotid bifurcation. Minimal diameter of the proximal ICA measures 3 mm. Compared to a more distal cervical ICA diameter of 4 mm, this indicates a 25% stenosis. Left carotid system: Common carotid artery widely patent to the bifurcation region. Primarily calcified plaque at the carotid bifurcation and ICA bulb. No stenosis. Cervical ICA appears normal beyond that. Vertebral arteries: Both vertebral arteries are approximately equal in size. No vertebral origin stenosis. Both appear normal through the cervical region. Skeleton: Previous ACDF C5-6. Other neck: No mass or lymphadenopathy. Previous thyroidectomy on the right. Upper chest: Emphysema and pulmonary scarring. Review of the MIP images confirms the above findings CTA HEAD FINDINGS Anterior circulation: Both internal carotid arteries are patent through the skull base and siphon regions. No siphon stenosis. The anterior and middle cerebral vessels are patent without proximal stenosis, aneurysm or vascular malformation. Posterior circulation: Both vertebral arteries are widely patent to the basilar. No basilar stenosis. Posterior circulation branch vessels are normal. Venous sinuses: Patent and normal. Anatomic variants: None significant. Delayed phase: No abnormal enhancement. Review of the MIP images confirms the above findings CT Brain Perfusion Findings: CBF (<30%) Volume: 44mL Perfusion (Tmax>6.0s) volume: 60mL Mismatch Volume: 16mL Infarction Location:None IMPRESSION: Negative CT perfusion. Atherosclerotic disease at both carotid bifurcations. 25% stenosis of the proximal ICA on the right. No stenosis on the left. No intracranial finding of significance. Tiny low-density in the anterior limb internal capsule on  the left remains indeterminate. This could be an old small vessel insult but I cannot rule out the possibility of a recent small vessel infarction, too small to show up on the perfusion exam. Electronically Signed   By: Nelson Chimes M.D.   On: 11/27/2017 16:24   Ct Angio Neck W Or Wo Contrast  Result Date: 11/27/2017 CLINICAL DATA:  Acute onset of right-sided weakness. EXAM: CT ANGIOGRAPHY HEAD AND NECK CT PERFUSION BRAIN TECHNIQUE: Multidetector CT imaging of the head and neck was performed using the standard protocol during bolus administration of intravenous contrast. Multiplanar CT image reconstructions and MIPs were obtained to evaluate the vascular  anatomy. Carotid stenosis measurements (when applicable) are obtained utilizing NASCET criteria, using the distal internal carotid diameter as the denominator. Multiphase CT imaging of the brain was performed following IV bolus contrast injection. Subsequent parametric perfusion maps were calculated using RAPID software. CONTRAST:  189mL ISOVUE-370 IOPAMIDOL (ISOVUE-370) INJECTION 76% COMPARISON:  Head CT earlier same day. FINDINGS: CTA NECK FINDINGS Aortic arch: Aortic atherosclerosis. No aneurysm or dissection. Branching pattern of the brachiocephalic vessels from the arch is normal without origin stenosis. Right carotid system: Common carotid artery widely patent to the bifurcation. Soft and calcified plaque at the carotid bifurcation. Minimal diameter of the proximal ICA measures 3 mm. Compared to a more distal cervical ICA diameter of 4 mm, this indicates a 25% stenosis. Left carotid system: Common carotid artery widely patent to the bifurcation region. Primarily calcified plaque at the carotid bifurcation and ICA bulb. No stenosis. Cervical ICA appears normal beyond that. Vertebral arteries: Both vertebral arteries are approximately equal in size. No vertebral origin stenosis. Both appear normal through the cervical region. Skeleton: Previous ACDF C5-6.  Other neck: No mass or lymphadenopathy. Previous thyroidectomy on the right. Upper chest: Emphysema and pulmonary scarring. Review of the MIP images confirms the above findings CTA HEAD FINDINGS Anterior circulation: Both internal carotid arteries are patent through the skull base and siphon regions. No siphon stenosis. The anterior and middle cerebral vessels are patent without proximal stenosis, aneurysm or vascular malformation. Posterior circulation: Both vertebral arteries are widely patent to the basilar. No basilar stenosis. Posterior circulation branch vessels are normal. Venous sinuses: Patent and normal. Anatomic variants: None significant. Delayed phase: No abnormal enhancement. Review of the MIP images confirms the above findings CT Brain Perfusion Findings: CBF (<30%) Volume: 72mL Perfusion (Tmax>6.0s) volume: 50mL Mismatch Volume: 4mL Infarction Location:None IMPRESSION: Negative CT perfusion. Atherosclerotic disease at both carotid bifurcations. 25% stenosis of the proximal ICA on the right. No stenosis on the left. No intracranial finding of significance. Tiny low-density in the anterior limb internal capsule on the left remains indeterminate. This could be an old small vessel insult but I cannot rule out the possibility of a recent small vessel infarction, too small to show up on the perfusion exam. Electronically Signed   By: Nelson Chimes M.D.   On: 11/27/2017 16:24   Mr Brain Wo Contrast  Result Date: 11/27/2017 CLINICAL DATA:  Acute onset of right facial droop and speech disturbance. EXAM: MRI HEAD WITHOUT CONTRAST TECHNIQUE: Multiplanar, multiecho pulse sequences of the brain and surrounding structures were obtained without intravenous contrast. COMPARISON:  CT studies same day FINDINGS: Brain: Diffusion imaging does not show any acute or subacute infarction. The brainstem and cerebellum are normal. Cerebral hemispheres are normal without evidence of old or acute small or large vessel  infarction. Previously described low-density in the anterior limb internal capsule on the left appears to be due to dilated perivascular spaces. No mass lesion, hemorrhage, hydrocephalus or extra-axial collection. Vascular: Major vessels at the base of the brain show flow. Skull and upper cervical spine: Negative Sinuses/Orbits: Right maxillary sinus opacification. Right ethmoid and frontal sinus disease. Findings consistent with ostiomeatal unit obstruction on the right. Orbits negative. Other: None IMPRESSION: No intracranial abnormality. Low-density in the anterior limb internal capsule on the left described at CT is secondary to dilated perivascular spaces, a normal variant. Right-sided paranasal sinusitis Electronically Signed   By: Nelson Chimes M.D.   On: 11/27/2017 18:55   Ct Cerebral Perfusion W Contrast  Result Date: 11/27/2017 CLINICAL DATA:  Acute onset of right-sided weakness. EXAM: CT ANGIOGRAPHY HEAD AND NECK CT PERFUSION BRAIN TECHNIQUE: Multidetector CT imaging of the head and neck was performed using the standard protocol during bolus administration of intravenous contrast. Multiplanar CT image reconstructions and MIPs were obtained to evaluate the vascular anatomy. Carotid stenosis measurements (when applicable) are obtained utilizing NASCET criteria, using the distal internal carotid diameter as the denominator. Multiphase CT imaging of the brain was performed following IV bolus contrast injection. Subsequent parametric perfusion maps were calculated using RAPID software. CONTRAST:  177mL ISOVUE-370 IOPAMIDOL (ISOVUE-370) INJECTION 76% COMPARISON:  Head CT earlier same day. FINDINGS: CTA NECK FINDINGS Aortic arch: Aortic atherosclerosis. No aneurysm or dissection. Branching pattern of the brachiocephalic vessels from the arch is normal without origin stenosis. Right carotid system: Common carotid artery widely patent to the bifurcation. Soft and calcified plaque at the carotid bifurcation.  Minimal diameter of the proximal ICA measures 3 mm. Compared to a more distal cervical ICA diameter of 4 mm, this indicates a 25% stenosis. Left carotid system: Common carotid artery widely patent to the bifurcation region. Primarily calcified plaque at the carotid bifurcation and ICA bulb. No stenosis. Cervical ICA appears normal beyond that. Vertebral arteries: Both vertebral arteries are approximately equal in size. No vertebral origin stenosis. Both appear normal through the cervical region. Skeleton: Previous ACDF C5-6. Other neck: No mass or lymphadenopathy. Previous thyroidectomy on the right. Upper chest: Emphysema and pulmonary scarring. Review of the MIP images confirms the above findings CTA HEAD FINDINGS Anterior circulation: Both internal carotid arteries are patent through the skull base and siphon regions. No siphon stenosis. The anterior and middle cerebral vessels are patent without proximal stenosis, aneurysm or vascular malformation. Posterior circulation: Both vertebral arteries are widely patent to the basilar. No basilar stenosis. Posterior circulation branch vessels are normal. Venous sinuses: Patent and normal. Anatomic variants: None significant. Delayed phase: No abnormal enhancement. Review of the MIP images confirms the above findings CT Brain Perfusion Findings: CBF (<30%) Volume: 74mL Perfusion (Tmax>6.0s) volume: 6mL Mismatch Volume: 13mL Infarction Location:None IMPRESSION: Negative CT perfusion. Atherosclerotic disease at both carotid bifurcations. 25% stenosis of the proximal ICA on the right. No stenosis on the left. No intracranial finding of significance. Tiny low-density in the anterior limb internal capsule on the left remains indeterminate. This could be an old small vessel insult but I cannot rule out the possibility of a recent small vessel infarction, too small to show up on the perfusion exam. Electronically Signed   By: Nelson Chimes M.D.   On: 11/27/2017 16:24   Ct Head  Code Stroke Wo Contrast  Result Date: 11/27/2017 CLINICAL DATA:  Code stroke. Right-sided numbness and slurred speech beginning 0930 hours today. EXAM: CT HEAD WITHOUT CONTRAST TECHNIQUE: Contiguous axial images were obtained from the base of the skull through the vertex without intravenous contrast. COMPARISON:  MRI 10/27/2012 FINDINGS: Brain: Generalized atrophy. Small area of low-density in the anterior limb internal capsule on the left favored to be old. No evidence of acute small vessel infarction, mass lesion, hemorrhage hydrocephalus or extra-axial collection. Vascular: No abnormal vascular finding. Skull: Negative Sinuses/Orbits: Ethmoid inflammatory changes and frontal inflammatory changes on the right. Orbits negative. Other: None ASPECTS ([place] Stroke Program Early CT Score) - Ganglionic level infarction (caudate, lentiform nuclei, internal capsule, insula, M1-M3 cortex): 6 - Supraganglionic infarction (M4-M6 cortex): 3 Total score (0-10 with 10 being normal): 9 IMPRESSION: 1. Small area of low-density in the anterior limb internal capsule on the left favored to  be old. No high suspicion acute finding. Generalized brain atrophy. 2. ASPECTS is 9. 3. These results were called by telephone at the time of interpretation on 11/27/2017 at 3:04 pm to Dr. Harvest Dark , who verbally acknowledged these results. Electronically Signed   By: Nelson Chimes M.D.   On: 11/27/2017 15:06    Assessment: 67 y.o. female presenting with dysarthria and right hemiparesis.  There are some functional features on the patient's examination today but can not rule out some underlying clinical features that may otherwise be hidden.  Although MR negative stroke on the differential, TIA more likely.  MRI of the brain reviewed and shows no acute changes.  CTA and CTP are unremarkable.  Patient on ASA at home prior to admission.   Echocardiogram pending.  A1c 5.7, LDL 123  Stroke Risk Factors - hyperlipidemia  Plan: 1.  Statin for lipid management with target LDL<70. 2. PT consult, OT consult, Speech consult 3. Echocardiogram pending 4. Prophylactic therapy-Antiplatelet med: Plavix - dose 75mg  daily in place of ASA 5. NPO until RN stroke swallow screen 6. Telemetry monitoring 7. Frequent neuro checks  Case discussed with Dr. Laury Deep, MD Neurology [phone] 11/28/2017, 1:01 PM

## 2017-11-28 NOTE — Care Management (Signed)
RNCM consulted on patient for possible home health needs. PT has recommended SNF but patient walked 300 feet and feels as if her strength is improving. Before these issues she was independent in the home. No DME used. Still drives but son Nori Riis [phone] may also provide transport. Patient unsure right now about possibly going home vs going to sons house. Has used [facility] in the past but that was before patient moved to [place]. Patient prefers to discuss closer to discharge to talk about home health services. PCP is [place], uses [facility] in [place] and obtains medications without issue. Will continue to follow.

## 2017-11-28 NOTE — Progress Notes (Addendum)
[facility] NAME: Brittany [name]    MR#:  [id number]  DATE OF BIRTH:  Sep 06, 1950  SUBJECTIVE:  CHIEF COMPLAINT:   Chief Complaint  Patient presents with  . Code Stroke  Patient seen and evaluated today Has right-sided weakness Slurred speech is improved No shortness of breath and chest pain CTA neck no occlusion MR brain no abnormality  REVIEW OF SYSTEMS:    ROS  CONSTITUTIONAL: No documented fever.Has fatigue, weakness. No weight gain, no weight loss.  EYES: No blurry or double vision.  ENT: No tinnitus. No postnasal drip. No redness of the oropharynx.  RESPIRATORY: No cough, no wheeze, no hemoptysis. No dyspnea.  CARDIOVASCULAR: No chest pain. No orthopnea. No palpitations. No syncope.  GASTROINTESTINAL: No nausea, no vomiting or diarrhea. No abdominal pain. No melena or hematochezia.  GENITOURINARY: No dysuria or hematuria.  ENDOCRINE: No polyuria or nocturia. No heat or cold intolerance.  HEMATOLOGY: No anemia. No bruising. No bleeding.  INTEGUMENTARY: No rashes. No lesions.  MUSCULOSKELETAL: No arthritis. No swelling. No gout.  NEUROLOGIC: Has right sided weakness PSYCHIATRIC: No anxiety. No insomnia. No ADD.   DRUG ALLERGIES:   Allergies  Allergen Reactions  . Lithium Anaphylaxis  . Fentanyl Other (See Comments)    Extreme Sedation Other reaction(s): Lethargy (intolerance) Extreme sedation   . Morphine Nausea And Vomiting  . Pseudoephedrine Hypertension    VITALS:  Blood pressure 120/73, pulse 67, temperature 97.8 F (36.6 C), temperature source Oral, resp. rate 18, height 5' 5.5" (1.664 m), weight 68.3 kg (150 lb 9.2 oz), SpO2 94 %.  PHYSICAL EXAMINATION:   Physical Exam  GENERAL:  67 y.o.-year-old patient lying in the bed with no acute distress.  EYES: Pupils equal, round, reactive to light and accommodation. No scleral icterus. Extraocular muscles intact.  HEENT: Head atraumatic, normocephalic.  Oropharynx and nasopharynx clear.  NECK:  Supple, no jugular venous distention. No thyroid enlargement, no tenderness.  LUNGS: Normal breath sounds bilaterally, no wheezing, rales, rhonchi. No use of accessory muscles of respiration.  CARDIOVASCULAR: S1, S2 normal. No murmurs, rubs, or gallops.  ABDOMEN: Soft, nontender, nondistended. Bowel sounds present. No organomegaly or mass.  EXTREMITIES: No cyanosis, clubbing or edema b/l.    NEUROLOGIC: Cranial nerves II through XII are intact. No  sensory deficits b/l.   Strength 5/5 left upper and left lower extremity Strength 4/5 right upper and right lower extremity PSYCHIATRIC: The patient is alert and oriented x 3.  SKIN: No obvious rash, lesion, or ulcer.   LABORATORY PANEL:   CBC Recent Labs  Lab 11/27/17 1450  WBC 6.6  HGB 10.6*  HCT 30.6*  PLT 204   ------------------------------------------------------------------------------------------------------------------ Chemistries  Recent Labs  Lab 11/27/17 1450  NA 140  K 3.6  CL 107  CO2 27  GLUCOSE 96  BUN 10  CREATININE 1.06*  CALCIUM 8.7*  AST 19  ALT 12  ALKPHOS 111  BILITOT 0.5   ------------------------------------------------------------------------------------------------------------------  Cardiac Enzymes Recent Labs  Lab 11/27/17 1450  TROPONINI <0.03   ------------------------------------------------------------------------------------------------------------------  RADIOLOGY:  Ct Angio Head W Or Wo Contrast  Result Date: 11/27/2017 CLINICAL DATA:  Acute onset of right-sided weakness. EXAM: CT ANGIOGRAPHY HEAD AND NECK CT PERFUSION BRAIN TECHNIQUE: Multidetector CT imaging of the head and neck was performed using the standard protocol during bolus administration of intravenous contrast. Multiplanar CT image reconstructions and MIPs were obtained to evaluate the vascular anatomy. Carotid stenosis measurements (when applicable) are obtained utilizing NASCET  criteria, using the distal internal carotid diameter as the denominator. Multiphase CT imaging of the brain was performed following IV bolus contrast injection. Subsequent parametric perfusion maps were calculated using RAPID software. CONTRAST:  164mL ISOVUE-370 IOPAMIDOL (ISOVUE-370) INJECTION 76% COMPARISON:  Head CT earlier same day. FINDINGS: CTA NECK FINDINGS Aortic arch: Aortic atherosclerosis. No aneurysm or dissection. Branching pattern of the brachiocephalic vessels from the arch is normal without origin stenosis. Right carotid system: Common carotid artery widely patent to the bifurcation. Soft and calcified plaque at the carotid bifurcation. Minimal diameter of the proximal ICA measures 3 mm. Compared to a more distal cervical ICA diameter of 4 mm, this indicates a 25% stenosis. Left carotid system: Common carotid artery widely patent to the bifurcation region. Primarily calcified plaque at the carotid bifurcation and ICA bulb. No stenosis. Cervical ICA appears normal beyond that. Vertebral arteries: Both vertebral arteries are approximately equal in size. No vertebral origin stenosis. Both appear normal through the cervical region. Skeleton: Previous ACDF C5-6. Other neck: No mass or lymphadenopathy. Previous thyroidectomy on the right. Upper chest: Emphysema and pulmonary scarring. Review of the MIP images confirms the above findings CTA HEAD FINDINGS Anterior circulation: Both internal carotid arteries are patent through the skull base and siphon regions. No siphon stenosis. The anterior and middle cerebral vessels are patent without proximal stenosis, aneurysm or vascular malformation. Posterior circulation: Both vertebral arteries are widely patent to the basilar. No basilar stenosis. Posterior circulation branch vessels are normal. Venous sinuses: Patent and normal. Anatomic variants: None significant. Delayed phase: No abnormal enhancement. Review of the MIP images confirms the above findings CT  Brain Perfusion Findings: CBF (<30%) Volume: 98mL Perfusion (Tmax>6.0s) volume: 70mL Mismatch Volume: 30mL Infarction Location:None IMPRESSION: Negative CT perfusion. Atherosclerotic disease at both carotid bifurcations. 25% stenosis of the proximal ICA on the right. No stenosis on the left. No intracranial finding of significance. Tiny low-density in the anterior limb internal capsule on the left remains indeterminate. This could be an old small vessel insult but I cannot rule out the possibility of a recent small vessel infarction, too small to show up on the perfusion exam. Electronically Signed   By: Nelson Chimes M.D.   On: 11/27/2017 16:24   Ct Angio Neck W Or Wo Contrast  Result Date: 11/27/2017 CLINICAL DATA:  Acute onset of right-sided weakness. EXAM: CT ANGIOGRAPHY HEAD AND NECK CT PERFUSION BRAIN TECHNIQUE: Multidetector CT imaging of the head and neck was performed using the standard protocol during bolus administration of intravenous contrast. Multiplanar CT image reconstructions and MIPs were obtained to evaluate the vascular anatomy. Carotid stenosis measurements (when applicable) are obtained utilizing NASCET criteria, using the distal internal carotid diameter as the denominator. Multiphase CT imaging of the brain was performed following IV bolus contrast injection. Subsequent parametric perfusion maps were calculated using RAPID software. CONTRAST:  156mL ISOVUE-370 IOPAMIDOL (ISOVUE-370) INJECTION 76% COMPARISON:  Head CT earlier same day. FINDINGS: CTA NECK FINDINGS Aortic arch: Aortic atherosclerosis. No aneurysm or dissection. Branching pattern of the brachiocephalic vessels from the arch is normal without origin stenosis. Right carotid system: Common carotid artery widely patent to the bifurcation. Soft and calcified plaque at the carotid bifurcation. Minimal diameter of the proximal ICA measures 3 mm. Compared to a more distal cervical ICA diameter of 4 mm, this indicates a 25% stenosis. Left  carotid system: Common carotid artery widely patent to the bifurcation region. Primarily calcified plaque at the carotid bifurcation and ICA bulb. No stenosis. Cervical ICA appears normal  beyond that. Vertebral arteries: Both vertebral arteries are approximately equal in size. No vertebral origin stenosis. Both appear normal through the cervical region. Skeleton: Previous ACDF C5-6. Other neck: No mass or lymphadenopathy. Previous thyroidectomy on the right. Upper chest: Emphysema and pulmonary scarring. Review of the MIP images confirms the above findings CTA HEAD FINDINGS Anterior circulation: Both internal carotid arteries are patent through the skull base and siphon regions. No siphon stenosis. The anterior and middle cerebral vessels are patent without proximal stenosis, aneurysm or vascular malformation. Posterior circulation: Both vertebral arteries are widely patent to the basilar. No basilar stenosis. Posterior circulation branch vessels are normal. Venous sinuses: Patent and normal. Anatomic variants: None significant. Delayed phase: No abnormal enhancement. Review of the MIP images confirms the above findings CT Brain Perfusion Findings: CBF (<30%) Volume: 52mL Perfusion (Tmax>6.0s) volume: 45mL Mismatch Volume: 27mL Infarction Location:None IMPRESSION: Negative CT perfusion. Atherosclerotic disease at both carotid bifurcations. 25% stenosis of the proximal ICA on the right. No stenosis on the left. No intracranial finding of significance. Tiny low-density in the anterior limb internal capsule on the left remains indeterminate. This could be an old small vessel insult but I cannot rule out the possibility of a recent small vessel infarction, too small to show up on the perfusion exam. Electronically Signed   By: Nelson Chimes M.D.   On: 11/27/2017 16:24   Mr Brain Wo Contrast  Result Date: 11/27/2017 CLINICAL DATA:  Acute onset of right facial droop and speech disturbance. EXAM: MRI HEAD WITHOUT CONTRAST  TECHNIQUE: Multiplanar, multiecho pulse sequences of the brain and surrounding structures were obtained without intravenous contrast. COMPARISON:  CT studies same day FINDINGS: Brain: Diffusion imaging does not show any acute or subacute infarction. The brainstem and cerebellum are normal. Cerebral hemispheres are normal without evidence of old or acute small or large vessel infarction. Previously described low-density in the anterior limb internal capsule on the left appears to be due to dilated perivascular spaces. No mass lesion, hemorrhage, hydrocephalus or extra-axial collection. Vascular: Major vessels at the base of the brain show flow. Skull and upper cervical spine: Negative Sinuses/Orbits: Right maxillary sinus opacification. Right ethmoid and frontal sinus disease. Findings consistent with ostiomeatal unit obstruction on the right. Orbits negative. Other: None IMPRESSION: No intracranial abnormality. Low-density in the anterior limb internal capsule on the left described at CT is secondary to dilated perivascular spaces, a normal variant. Right-sided paranasal sinusitis Electronically Signed   By: Nelson Chimes M.D.   On: 11/27/2017 18:55   Ct Cerebral Perfusion W Contrast  Result Date: 11/27/2017 CLINICAL DATA:  Acute onset of right-sided weakness. EXAM: CT ANGIOGRAPHY HEAD AND NECK CT PERFUSION BRAIN TECHNIQUE: Multidetector CT imaging of the head and neck was performed using the standard protocol during bolus administration of intravenous contrast. Multiplanar CT image reconstructions and MIPs were obtained to evaluate the vascular anatomy. Carotid stenosis measurements (when applicable) are obtained utilizing NASCET criteria, using the distal internal carotid diameter as the denominator. Multiphase CT imaging of the brain was performed following IV bolus contrast injection. Subsequent parametric perfusion maps were calculated using RAPID software. CONTRAST:  173mL ISOVUE-370 IOPAMIDOL (ISOVUE-370)  INJECTION 76% COMPARISON:  Head CT earlier same day. FINDINGS: CTA NECK FINDINGS Aortic arch: Aortic atherosclerosis. No aneurysm or dissection. Branching pattern of the brachiocephalic vessels from the arch is normal without origin stenosis. Right carotid system: Common carotid artery widely patent to the bifurcation. Soft and calcified plaque at the carotid bifurcation. Minimal diameter of the proximal ICA  measures 3 mm. Compared to a more distal cervical ICA diameter of 4 mm, this indicates a 25% stenosis. Left carotid system: Common carotid artery widely patent to the bifurcation region. Primarily calcified plaque at the carotid bifurcation and ICA bulb. No stenosis. Cervical ICA appears normal beyond that. Vertebral arteries: Both vertebral arteries are approximately equal in size. No vertebral origin stenosis. Both appear normal through the cervical region. Skeleton: Previous ACDF C5-6. Other neck: No mass or lymphadenopathy. Previous thyroidectomy on the right. Upper chest: Emphysema and pulmonary scarring. Review of the MIP images confirms the above findings CTA HEAD FINDINGS Anterior circulation: Both internal carotid arteries are patent through the skull base and siphon regions. No siphon stenosis. The anterior and middle cerebral vessels are patent without proximal stenosis, aneurysm or vascular malformation. Posterior circulation: Both vertebral arteries are widely patent to the basilar. No basilar stenosis. Posterior circulation branch vessels are normal. Venous sinuses: Patent and normal. Anatomic variants: None significant. Delayed phase: No abnormal enhancement. Review of the MIP images confirms the above findings CT Brain Perfusion Findings: CBF (<30%) Volume: 54mL Perfusion (Tmax>6.0s) volume: 19mL Mismatch Volume: 29mL Infarction Location:None IMPRESSION: Negative CT perfusion. Atherosclerotic disease at both carotid bifurcations. 25% stenosis of the proximal ICA on the right. No stenosis on the  left. No intracranial finding of significance. Tiny low-density in the anterior limb internal capsule on the left remains indeterminate. This could be an old small vessel insult but I cannot rule out the possibility of a recent small vessel infarction, too small to show up on the perfusion exam. Electronically Signed   By: Nelson Chimes M.D.   On: 11/27/2017 16:24   Ct Head Code Stroke Wo Contrast  Result Date: 11/27/2017 CLINICAL DATA:  Code stroke. Right-sided numbness and slurred speech beginning 0930 hours today. EXAM: CT HEAD WITHOUT CONTRAST TECHNIQUE: Contiguous axial images were obtained from the base of the skull through the vertex without intravenous contrast. COMPARISON:  MRI 10/27/2012 FINDINGS: Brain: Generalized atrophy. Small area of low-density in the anterior limb internal capsule on the left favored to be old. No evidence of acute small vessel infarction, mass lesion, hemorrhage hydrocephalus or extra-axial collection. Vascular: No abnormal vascular finding. Skull: Negative Sinuses/Orbits: Ethmoid inflammatory changes and frontal inflammatory changes on the right. Orbits negative. Other: None ASPECTS ([place] Stroke Program Early CT Score) - Ganglionic level infarction (caudate, lentiform nuclei, internal capsule, insula, M1-M3 cortex): 6 - Supraganglionic infarction (M4-M6 cortex): 3 Total score (0-10 with 10 being normal): 9 IMPRESSION: 1. Small area of low-density in the anterior limb internal capsule on the left favored to be old. No high suspicion acute finding. Generalized brain atrophy. 2. ASPECTS is 9. 3. These results were called by telephone at the time of interpretation on 11/27/2017 at 3:04 pm to Dr. Harvest Dark , who verbally acknowledged these results. Electronically Signed   By: Nelson Chimes M.D.   On: 11/27/2017 15:06     ASSESSMENT AND PLAN:   67 year old female patient with history of hyperlipidemia, anemia, osteoporosis, GERD presented to the emergency room  initially for dysarthria and right-sided weakness  -Dysphagia Passed swallow study  started diet  - Acute TIA Has right sided weakness (imaging studies negative) which is improving No acute abnormality on CTA  neck and MRI brain Status post neurology consultation Continue aspirin Follow-up echocardiogram Physical therapy and occupational therapy evaluation  -Hypertension PRN IV hydralazine for control of blood pressure systolic more than 400  -DVT prophylaxis subcu Lovenox daily  All the records  are reviewed and case discussed with Care Management/Social Worker. Management plans discussed with the patient, family and they are in agreement.  CODE STATUS: DNR  DVT Prophylaxis: SCDs  TOTAL TIME TAKING CARE OF THIS PATIENT: 35 minutes.   POSSIBLE D/C IN 1 to 2 DAYS, DEPENDING ON CLINICAL CONDITION.  Saundra Shelling M.D on 11/28/2017 at 1:38 PM  Between 7am to 6pm - Pager - [phone]  After 6pm go to [email or web address] - password [email or web address] [facility]  Office  [phone]  CC: Primary care physician; Derinda Late, MD  Note: This dictation was prepared with Dragon dictation along with smaller phrase technology. Any transcriptional errors that result from this process are unintentional.

## 2017-11-28 NOTE — Clinical Social Work Note (Addendum)
CSW received consult for possible SNF placement. PT evaluation is pending. CSW will follow depending on the outcome of the evaluation.  UPDATE: PT has recommended SNF; however, as the patient can ambulate 300 feet, the potential for her insurance company to authorize SNF is doubtful. In addition, the patient has declined SNF when discussing options with the RNCM. The RNCM is assisting the patient with home health options. The CSW is signing off. Please consult should needs change.  Santiago Bumpers, MSW, Latanya Presser [phone]

## 2017-11-28 NOTE — Progress Notes (Signed)
*  PRELIMINARY RESULTS* Echocardiogram 2D Echocardiogram has been performed. Saline Microcavitation (Bubble Study) was performed on this study.  Brittany [name] 11/28/2017, 12:14 PM

## 2017-11-28 NOTE — Progress Notes (Signed)
Physical Therapy Evaluation Patient Details Name: Brittany [name] MRN: [id number] DOB: 08/05/1950 Today's Date: 11/28/2017   History of Present Illness  Brittany [name] is a 67 y.o. female with medical history as listed below who presents for evaluation of acute onset and severe neurological symptoms including right-sided facial droop, difficulty with speech, difficulty with balance, and weakness on the right side of her body.  She is alert and oriented.   Clinical Impression  Patient presents with slurred speech and orientated to person, place, time. Patient has weakness RUE and RLE , numbness in right hand and right lower leg, decreased coordination RUE and RLE, impaired static and dynamic standing balance with decreased ability to reach forward, inability to stand on single leg or stand tandem. She ambulates with HHA for 300 feet with wide base of support, decreased gait speed, path deviations with head turns left and right. She will benefit from skilled PT to return to PLOF.    Follow Up Recommendations SNF    Equipment Recommendations  Rolling walker with 5" wheels    Recommendations for Other Services       Precautions / Restrictions Precautions Precautions: Fall Precaution Comments: (Patient reports 5 falls in the last 5 months) Restrictions Weight Bearing Restrictions: No      Mobility  Bed Mobility Overal bed mobility: Independent                Transfers Overall transfer level: Modified independent Equipment used: 1 person hand held assist(HHA)             General transfer comment: needs VC for safety  Ambulation/Gait Ambulation/Gait assistance: Min guard(HHA x 1 for 300 feet) Gait Distance (Feet): 300 Feet Assistive device: (HHA) Gait Pattern/deviations: Step-through pattern;Wide base of support   Gait velocity interpretation: <1.31 ft/sec, indicative of household ambulator General Gait Details: decreased gait speed,wide base of support  Stairs             Wheelchair Mobility    Modified Rankin (Stroke Patients Only)       Balance Overall balance assessment: Needs assistance Sitting-balance support: Feet supported;No upper extremity supported Sitting balance-Leahy Scale: Good     Standing balance support: No upper extremity supported Standing balance-Leahy Scale: Fair Standing balance comment: unable to single leg stand, unable to tandem stand             High level balance activites: Head turns;Side stepping               Pertinent Vitals/Pain Pain Assessment: No/denies pain    Home Living Family/patient expects to be discharged to:: Skilled nursing facility Living Arrangements: Alone                    Prior Function Level of Independence: Independent               Hand Dominance   Dominant Hand: Left    Extremity/Trunk Assessment   Upper Extremity Assessment Upper Extremity Assessment: Defer to OT evaluation    Lower Extremity Assessment Lower Extremity Assessment: Generalized weakness       Communication   Communication: (slurred speech )  Cognition Arousal/Alertness: Awake/alert Behavior During Therapy: WFL for tasks assessed/performed Overall Cognitive Status: Within Functional Limits for tasks assessed                                        General Comments  Exercises     Assessment/Plan    PT Assessment Patient needs continued PT services  PT Problem List Decreased strength;Decreased activity tolerance;Decreased coordination       PT Treatment Interventions Gait training;Therapeutic activities;Therapeutic exercise;Balance training;Neuromuscular re-education    PT Goals (Current goals can be found in the Care Plan section)  Acute Rehab PT Goals Patient Stated Goal: to walk better PT Goal Formulation: With patient Time For Goal Achievement: 12/12/17 Potential to Achieve Goals: Good    Frequency 7X/week   Barriers to discharge  Decreased caregiver support (lives alone)    Co-evaluation               AM-PAC PT "6 Clicks" Daily Activity  Outcome Measure Difficulty turning over in bed (including adjusting bedclothes, sheets and blankets)?: None Difficulty moving from lying on back to sitting on the side of the bed? : None Difficulty sitting down on and standing up from a chair with arms (e.g., wheelchair, bedside commode, etc,.)?: A Little Help needed moving to and from a bed to chair (including a wheelchair)?: A Little Help needed walking in hospital room?: A Little Help needed climbing 3-5 steps with a railing? : A Little 6 Click Score: 20    End of Session Equipment Utilized During Treatment: Gait belt Activity Tolerance: Patient tolerated treatment well;No increased pain Patient left: in bed;with call bell/phone within reach   PT Visit Diagnosis: Unsteadiness on feet (R26.81);Other abnormalities of gait and mobility (R26.89);Muscle weakness (generalized) (M62.81);History of falling (Z91.81);Difficulty in walking, not elsewhere classified (R26.2)    Time: 1010-1040 PT Time Calculation (min) (ACUTE ONLY): 30 min   Charges:   PT Evaluation $PT Eval Low Complexity: 1 Low PT Treatments $Gait Training: 8-22 mins   PT G Codes:   PT G-Codes **NOT FOR INPATIENT CLASS** Functional Assessment Tool Used: Clinical judgement Functional Limitation: Mobility: Walking and moving around Mobility: Walking and Moving Around Current Status (G1829): At least 1 percent but less than 20 percent impaired, limited or restricted      Alanson Puls, PT DPT 11/28/2017, 11:34 AM

## 2017-11-29 DIAGNOSIS — I639 Cerebral infarction, unspecified: Secondary | ICD-10-CM | POA: Diagnosis not present

## 2017-11-29 DIAGNOSIS — G459 Transient cerebral ischemic attack, unspecified: Secondary | ICD-10-CM | POA: Diagnosis not present

## 2017-11-29 MED ORDER — ATORVASTATIN CALCIUM 20 MG PO TABS
40.0000 mg | ORAL_TABLET | Freq: Once | ORAL | Status: AC
  Administered 2017-11-29: 10:00:00 40 mg via ORAL
  Filled 2017-11-29: qty 2

## 2017-11-29 MED ORDER — CLOPIDOGREL BISULFATE 75 MG PO TABS: 75.0000 mg | ORAL_TABLET | Freq: Every day | ORAL | 1 refills | Status: AC

## 2017-11-29 MED ORDER — ATORVASTATIN CALCIUM 40 MG PO TABS: 40.0000 mg | ORAL_TABLET | Freq: Every day | ORAL | 0 refills | Status: DC

## 2017-11-29 NOTE — Discharge Summary (Signed)
[facility] NAME: Brittany [name]    MR#:  [id number]  DATE OF BIRTH:  07-30-1950  DATE OF ADMISSION:  11/27/2017 ADMITTING PHYSICIAN: Hillary Bow, MD  DATE OF DISCHARGE: 11/29/2017  1:55 PM  PRIMARY CARE PHYSICIAN: Derinda Late, MD   ADMISSION DIAGNOSIS:  Acute CVA (cerebrovascular accident) [facility]) [I63.9] Dysphagia Hypertension GERD Hyperlipidemia DISCHARGE DIAGNOSIS:  Active Problems:   CVA (cerebral vascular accident) ([place]) Hypertension GERD  SECONDARY DIAGNOSIS:   Past Medical History:  Diagnosis Date  . Anemia   . Anxiety   . Cataract   . Complication of anesthesia   . Depression   . GERD (gastroesophageal reflux disease)   . H/O hiatal hernia   . Hypercholesteremia   . Hypothyroidism   . Osteoporosis   . PONV (postoperative nausea and vomiting)      ADMITTING HISTORY Brittany [name]  is a 67 y.o. female with a known history of hypertension, hypothyroidism, hyperlipidemia, GERD presents to the emergency room with right-sided weakness and numbness with dysarthria which started at 9:30 AM.  Patient presented to the emergency room at 2:45 PM.  Code stroke was activated.  She was VAN positive. Patient CT scan of the head showed nothing acute.  CTA of the head and neck was done along with cerebral perfusion study with no large vessel occlusion.  Patient is being admitted for further work-up for acute CVA.  She has had no change of symptoms since morning today.  Has significant weakness on the right side. No prior strokes.  No history of atrial fibrillation or diabetes.  Not on anticoagulants.  Takes baby aspirin daily. Failed swallow screen in the emergency room.  HOSPITAL COURSE:  Patient was admitted to the medical floor.  She was worked up with CTA head and neck, CT head, MRI brain.   She was also worked up with echocardiogram.. Patient received aspirin and statin medication she received physical therapy occupational  therapy and speech therapy. Patient passed swallow study and diet was started.  Her weakness on the right side improved and her dysarthria also resolved.  Was seen by neurology attending, who recommended plavix and statin as out patient. Patient will be discharged home with home health services. Echo : Left ventricle: The cavity size was normal. Systolic function was   normal. The estimated ejection fraction was in the range of 60%   to 65%. Wall motion was normal; there were no regional wall   motion abnormalities. Doppler parameters are consistent with   abnormal left ventricular relaxation (grade 1 diastolic   dysfunction). - Right ventricle: Systolic function was normal. - Atrial septum: No defect or patent foramen ovale was identified.   Echo contrast study showed no right-to-left atrial level shunt,   at baseline or with provocation CONSULTS OBTAINED:  Treatment Team:  Alexis Goodell, MD  DRUG ALLERGIES:   Allergies  Allergen Reactions  . Lithium Anaphylaxis  . Fentanyl Other (See Comments)    Extreme Sedation Other reaction(s): Lethargy (intolerance) Extreme sedation   . Morphine Nausea And Vomiting  . Pseudoephedrine Hypertension    DISCHARGE MEDICATIONS:   Allergies as of 11/29/2017      Reactions   Lithium Anaphylaxis   Fentanyl Other (See Comments)   Extreme Sedation Other reaction(s): Lethargy (intolerance) Extreme sedation   Morphine Nausea And Vomiting   Pseudoephedrine Hypertension      Medication List    STOP taking these medications   aspirin EC 81 MG tablet  pantoprazole 40 MG tablet Commonly known as:  PROTONIX     TAKE these medications   aluminum hydroxide-magnesium carbonate 95-358 MG/15ML Susp Commonly known as:  GAVISCON Take 15 mLs by mouth every 6 (six) hours as needed (for gas).   atorvastatin 40 MG tablet Commonly known as:  LIPITOR Take 1 tablet (40 mg total) by mouth daily.   calcium carbonate 600 MG Tabs tablet Commonly  known as:  OS-CAL Take 600 mg by mouth 2 (two) times daily with a meal.   citalopram 40 MG tablet Commonly known as:  CELEXA Take 40 mg by mouth every morning.   clopidogrel 75 MG tablet Commonly known as:  PLAVIX Take 1 tablet (75 mg total) by mouth daily.   clorazepate 7.5 MG tablet Commonly known as:  TRANXENE Take 7.5 mg by mouth See admin instructions. Take 7.5 mg by mouth in the morning and at bedtime. May take an additional during the day if needed.   famotidine 20 MG tablet Commonly known as:  PEPCID Take 20 mg by mouth daily.   levothyroxine 25 MCG tablet Commonly known as:  SYNTHROID, LEVOTHROID Take 25 mcg by mouth every morning.   loratadine 10 MG tablet Commonly known as:  CLARITIN Take 10 mg by mouth at bedtime.   montelukast 10 MG tablet Commonly known as:  SINGULAIR Take 10 mg by mouth at bedtime.   prednisoLONE acetate 1 % ophthalmic suspension Commonly known as:  PRED FORTE Place 1 drop into the left eye 4 (four) times daily.   topiramate 25 MG tablet Commonly known as:  TOPAMAX Take 25 mg by mouth daily.   traZODone 50 MG tablet Commonly known as:  DESYREL Take 50 mg by mouth at bedtime.   WOMENS 50+ MULTI VITAMIN/MIN PO Take 1 tablet by mouth daily.       Today  Patient seen today Right sided weakness improved Dysarthria improved.  VITAL SIGNS:  Blood pressure 108/67, pulse 72, temperature 97.9 F (36.6 C), temperature source Oral, resp. rate 20, height 5' 5.5" (1.664 m), weight 68.3 kg (150 lb 9.2 oz), SpO2 98 %.  I/O:    Intake/Output Summary (Last 24 hours) at 11/29/2017 1424 Last data filed at 11/29/2017 1340 Gross per 24 hour  Intake 1747.5 ml  Output -  Net 1747.5 ml    PHYSICAL EXAMINATION:  Physical Exam  GENERAL:  67 y.o.-year-old patient lying in the bed with no acute distress.  LUNGS: Normal breath sounds bilaterally, no wheezing, rales,rhonchi or crepitation. No use of accessory muscles of respiration.   CARDIOVASCULAR: S1, S2 normal. No murmurs, rubs, or gallops.  ABDOMEN: Soft, non-tender, non-distended. Bowel sounds present. No organomegaly or mass.  NEUROLOGIC: Moves all 4 extremities. PSYCHIATRIC: The patient is alert and oriented x 3.  SKIN: No obvious rash, lesion, or ulcer.   DATA REVIEW:   CBC Recent Labs  Lab 11/27/17 1450  WBC 6.6  HGB 10.6*  HCT 30.6*  PLT 204    Chemistries  Recent Labs  Lab 11/27/17 1450  NA 140  K 3.6  CL 107  CO2 27  GLUCOSE 96  BUN 10  CREATININE 1.06*  CALCIUM 8.7*  AST 19  ALT 12  ALKPHOS 111  BILITOT 0.5    Cardiac Enzymes Recent Labs  Lab 11/27/17 1450  TROPONINI <0.03    Microbiology Results  Results for orders placed or performed during the hospital encounter of 11/23/16  Gastrointestinal Panel by PCR , Stool     Status: None  Collection Time: 11/23/16  7:44 AM  Result Value Ref Range Status   Campylobacter species NOT DETECTED NOT DETECTED Final   Plesimonas shigelloides NOT DETECTED NOT DETECTED Final   Salmonella species NOT DETECTED NOT DETECTED Final   Yersinia enterocolitica NOT DETECTED NOT DETECTED Final   Vibrio species NOT DETECTED NOT DETECTED Final   Vibrio cholerae NOT DETECTED NOT DETECTED Final   Enteroaggregative E coli (EAEC) NOT DETECTED NOT DETECTED Final   Enteropathogenic E coli (EPEC) NOT DETECTED NOT DETECTED Final   Enterotoxigenic E coli (ETEC) NOT DETECTED NOT DETECTED Final   Shiga like toxin producing E coli (STEC) NOT DETECTED NOT DETECTED Final   Shigella/Enteroinvasive E coli (EIEC) NOT DETECTED NOT DETECTED Final   Cryptosporidium NOT DETECTED NOT DETECTED Final   Cyclospora cayetanensis NOT DETECTED NOT DETECTED Final   Entamoeba histolytica NOT DETECTED NOT DETECTED Final   Giardia lamblia NOT DETECTED NOT DETECTED Final   Adenovirus F40/41 NOT DETECTED NOT DETECTED Final   Astrovirus NOT DETECTED NOT DETECTED Final   Norovirus GI/GII NOT DETECTED NOT DETECTED Final    Rotavirus A NOT DETECTED NOT DETECTED Final   Sapovirus (I, II, IV, and V) NOT DETECTED NOT DETECTED Final    RADIOLOGY:  Ct Angio Head W Or Wo Contrast  Result Date: 11/27/2017 CLINICAL DATA:  Acute onset of right-sided weakness. EXAM: CT ANGIOGRAPHY HEAD AND NECK CT PERFUSION BRAIN TECHNIQUE: Multidetector CT imaging of the head and neck was performed using the standard protocol during bolus administration of intravenous contrast. Multiplanar CT image reconstructions and MIPs were obtained to evaluate the vascular anatomy. Carotid stenosis measurements (when applicable) are obtained utilizing NASCET criteria, using the distal internal carotid diameter as the denominator. Multiphase CT imaging of the brain was performed following IV bolus contrast injection. Subsequent parametric perfusion maps were calculated using RAPID software. CONTRAST:  176mL ISOVUE-370 IOPAMIDOL (ISOVUE-370) INJECTION 76% COMPARISON:  Head CT earlier same day. FINDINGS: CTA NECK FINDINGS Aortic arch: Aortic atherosclerosis. No aneurysm or dissection. Branching pattern of the brachiocephalic vessels from the arch is normal without origin stenosis. Right carotid system: Common carotid artery widely patent to the bifurcation. Soft and calcified plaque at the carotid bifurcation. Minimal diameter of the proximal ICA measures 3 mm. Compared to a more distal cervical ICA diameter of 4 mm, this indicates a 25% stenosis. Left carotid system: Common carotid artery widely patent to the bifurcation region. Primarily calcified plaque at the carotid bifurcation and ICA bulb. No stenosis. Cervical ICA appears normal beyond that. Vertebral arteries: Both vertebral arteries are approximately equal in size. No vertebral origin stenosis. Both appear normal through the cervical region. Skeleton: Previous ACDF C5-6. Other neck: No mass or lymphadenopathy. Previous thyroidectomy on the right. Upper chest: Emphysema and pulmonary scarring. Review of the  MIP images confirms the above findings CTA HEAD FINDINGS Anterior circulation: Both internal carotid arteries are patent through the skull base and siphon regions. No siphon stenosis. The anterior and middle cerebral vessels are patent without proximal stenosis, aneurysm or vascular malformation. Posterior circulation: Both vertebral arteries are widely patent to the basilar. No basilar stenosis. Posterior circulation branch vessels are normal. Venous sinuses: Patent and normal. Anatomic variants: None significant. Delayed phase: No abnormal enhancement. Review of the MIP images confirms the above findings CT Brain Perfusion Findings: CBF (<30%) Volume: 110mL Perfusion (Tmax>6.0s) volume: 12mL Mismatch Volume: 4mL Infarction Location:None IMPRESSION: Negative CT perfusion. Atherosclerotic disease at both carotid bifurcations. 25% stenosis of the proximal ICA on the right. No stenosis  on the left. No intracranial finding of significance. Tiny low-density in the anterior limb internal capsule on the left remains indeterminate. This could be an old small vessel insult but I cannot rule out the possibility of a recent small vessel infarction, too small to show up on the perfusion exam. Electronically Signed   By: Nelson Chimes M.D.   On: 11/27/2017 16:24   Ct Angio Neck W Or Wo Contrast  Result Date: 11/27/2017 CLINICAL DATA:  Acute onset of right-sided weakness. EXAM: CT ANGIOGRAPHY HEAD AND NECK CT PERFUSION BRAIN TECHNIQUE: Multidetector CT imaging of the head and neck was performed using the standard protocol during bolus administration of intravenous contrast. Multiplanar CT image reconstructions and MIPs were obtained to evaluate the vascular anatomy. Carotid stenosis measurements (when applicable) are obtained utilizing NASCET criteria, using the distal internal carotid diameter as the denominator. Multiphase CT imaging of the brain was performed following IV bolus contrast injection. Subsequent parametric  perfusion maps were calculated using RAPID software. CONTRAST:  164mL ISOVUE-370 IOPAMIDOL (ISOVUE-370) INJECTION 76% COMPARISON:  Head CT earlier same day. FINDINGS: CTA NECK FINDINGS Aortic arch: Aortic atherosclerosis. No aneurysm or dissection. Branching pattern of the brachiocephalic vessels from the arch is normal without origin stenosis. Right carotid system: Common carotid artery widely patent to the bifurcation. Soft and calcified plaque at the carotid bifurcation. Minimal diameter of the proximal ICA measures 3 mm. Compared to a more distal cervical ICA diameter of 4 mm, this indicates a 25% stenosis. Left carotid system: Common carotid artery widely patent to the bifurcation region. Primarily calcified plaque at the carotid bifurcation and ICA bulb. No stenosis. Cervical ICA appears normal beyond that. Vertebral arteries: Both vertebral arteries are approximately equal in size. No vertebral origin stenosis. Both appear normal through the cervical region. Skeleton: Previous ACDF C5-6. Other neck: No mass or lymphadenopathy. Previous thyroidectomy on the right. Upper chest: Emphysema and pulmonary scarring. Review of the MIP images confirms the above findings CTA HEAD FINDINGS Anterior circulation: Both internal carotid arteries are patent through the skull base and siphon regions. No siphon stenosis. The anterior and middle cerebral vessels are patent without proximal stenosis, aneurysm or vascular malformation. Posterior circulation: Both vertebral arteries are widely patent to the basilar. No basilar stenosis. Posterior circulation branch vessels are normal. Venous sinuses: Patent and normal. Anatomic variants: None significant. Delayed phase: No abnormal enhancement. Review of the MIP images confirms the above findings CT Brain Perfusion Findings: CBF (<30%) Volume: 53mL Perfusion (Tmax>6.0s) volume: 49mL Mismatch Volume: 71mL Infarction Location:None IMPRESSION: Negative CT perfusion. Atherosclerotic  disease at both carotid bifurcations. 25% stenosis of the proximal ICA on the right. No stenosis on the left. No intracranial finding of significance. Tiny low-density in the anterior limb internal capsule on the left remains indeterminate. This could be an old small vessel insult but I cannot rule out the possibility of a recent small vessel infarction, too small to show up on the perfusion exam. Electronically Signed   By: Nelson Chimes M.D.   On: 11/27/2017 16:24   Mr Brain Wo Contrast  Result Date: 11/27/2017 CLINICAL DATA:  Acute onset of right facial droop and speech disturbance. EXAM: MRI HEAD WITHOUT CONTRAST TECHNIQUE: Multiplanar, multiecho pulse sequences of the brain and surrounding structures were obtained without intravenous contrast. COMPARISON:  CT studies same day FINDINGS: Brain: Diffusion imaging does not show any acute or subacute infarction. The brainstem and cerebellum are normal. Cerebral hemispheres are normal without evidence of old or acute small or large  vessel infarction. Previously described low-density in the anterior limb internal capsule on the left appears to be due to dilated perivascular spaces. No mass lesion, hemorrhage, hydrocephalus or extra-axial collection. Vascular: Major vessels at the base of the brain show flow. Skull and upper cervical spine: Negative Sinuses/Orbits: Right maxillary sinus opacification. Right ethmoid and frontal sinus disease. Findings consistent with ostiomeatal unit obstruction on the right. Orbits negative. Other: None IMPRESSION: No intracranial abnormality. Low-density in the anterior limb internal capsule on the left described at CT is secondary to dilated perivascular spaces, a normal variant. Right-sided paranasal sinusitis Electronically Signed   By: Nelson Chimes M.D.   On: 11/27/2017 18:55   Ct Cerebral Perfusion W Contrast  Result Date: 11/27/2017 CLINICAL DATA:  Acute onset of right-sided weakness. EXAM: CT ANGIOGRAPHY HEAD AND NECK CT  PERFUSION BRAIN TECHNIQUE: Multidetector CT imaging of the head and neck was performed using the standard protocol during bolus administration of intravenous contrast. Multiplanar CT image reconstructions and MIPs were obtained to evaluate the vascular anatomy. Carotid stenosis measurements (when applicable) are obtained utilizing NASCET criteria, using the distal internal carotid diameter as the denominator. Multiphase CT imaging of the brain was performed following IV bolus contrast injection. Subsequent parametric perfusion maps were calculated using RAPID software. CONTRAST:  186mL ISOVUE-370 IOPAMIDOL (ISOVUE-370) INJECTION 76% COMPARISON:  Head CT earlier same day. FINDINGS: CTA NECK FINDINGS Aortic arch: Aortic atherosclerosis. No aneurysm or dissection. Branching pattern of the brachiocephalic vessels from the arch is normal without origin stenosis. Right carotid system: Common carotid artery widely patent to the bifurcation. Soft and calcified plaque at the carotid bifurcation. Minimal diameter of the proximal ICA measures 3 mm. Compared to a more distal cervical ICA diameter of 4 mm, this indicates a 25% stenosis. Left carotid system: Common carotid artery widely patent to the bifurcation region. Primarily calcified plaque at the carotid bifurcation and ICA bulb. No stenosis. Cervical ICA appears normal beyond that. Vertebral arteries: Both vertebral arteries are approximately equal in size. No vertebral origin stenosis. Both appear normal through the cervical region. Skeleton: Previous ACDF C5-6. Other neck: No mass or lymphadenopathy. Previous thyroidectomy on the right. Upper chest: Emphysema and pulmonary scarring. Review of the MIP images confirms the above findings CTA HEAD FINDINGS Anterior circulation: Both internal carotid arteries are patent through the skull base and siphon regions. No siphon stenosis. The anterior and middle cerebral vessels are patent without proximal stenosis, aneurysm or  vascular malformation. Posterior circulation: Both vertebral arteries are widely patent to the basilar. No basilar stenosis. Posterior circulation branch vessels are normal. Venous sinuses: Patent and normal. Anatomic variants: None significant. Delayed phase: No abnormal enhancement. Review of the MIP images confirms the above findings CT Brain Perfusion Findings: CBF (<30%) Volume: 28mL Perfusion (Tmax>6.0s) volume: 41mL Mismatch Volume: 51mL Infarction Location:None IMPRESSION: Negative CT perfusion. Atherosclerotic disease at both carotid bifurcations. 25% stenosis of the proximal ICA on the right. No stenosis on the left. No intracranial finding of significance. Tiny low-density in the anterior limb internal capsule on the left remains indeterminate. This could be an old small vessel insult but I cannot rule out the possibility of a recent small vessel infarction, too small to show up on the perfusion exam. Electronically Signed   By: Nelson Chimes M.D.   On: 11/27/2017 16:24   Ct Head Code Stroke Wo Contrast  Result Date: 11/27/2017 CLINICAL DATA:  Code stroke. Right-sided numbness and slurred speech beginning 0930 hours today. EXAM: CT HEAD WITHOUT CONTRAST TECHNIQUE: Contiguous  axial images were obtained from the base of the skull through the vertex without intravenous contrast. COMPARISON:  MRI 10/27/2012 FINDINGS: Brain: Generalized atrophy. Small area of low-density in the anterior limb internal capsule on the left favored to be old. No evidence of acute small vessel infarction, mass lesion, hemorrhage hydrocephalus or extra-axial collection. Vascular: No abnormal vascular finding. Skull: Negative Sinuses/Orbits: Ethmoid inflammatory changes and frontal inflammatory changes on the right. Orbits negative. Other: None ASPECTS ([place] Stroke Program Early CT Score) - Ganglionic level infarction (caudate, lentiform nuclei, internal capsule, insula, M1-M3 cortex): 6 - Supraganglionic infarction (M4-M6  cortex): 3 Total score (0-10 with 10 being normal): 9 IMPRESSION: 1. Small area of low-density in the anterior limb internal capsule on the left favored to be old. No high suspicion acute finding. Generalized brain atrophy. 2. ASPECTS is 9. 3. These results were called by telephone at the time of interpretation on 11/27/2017 at 3:04 pm to Dr. Harvest Dark , who verbally acknowledged these results. Electronically Signed   By: Nelson Chimes M.D.   On: 11/27/2017 15:06    Follow up with PCP in 1 week.  Management plans discussed with the patient, family and they are in agreement.  CODE STATUS: DNR    Code Status Orders  (From admission, onward)        Start     Ordered   11/27/17 1707  Do not attempt resuscitation (DNR)  Continuous    Question Answer Comment  In the event of cardiac or respiratory ARREST Do not call a "code blue"   In the event of cardiac or respiratory ARREST Do not perform Intubation, CPR, defibrillation or ACLS   In the event of cardiac or respiratory ARREST Use medication by any route, position, wound care, and other measures to relive pain and suffering. May use oxygen, suction and manual treatment of airway obstruction as needed for comfort.      11/27/17 1706    Code Status History    Date Active Date Inactive Code Status Order ID Comments User Context   11/27/2017 1643 11/27/2017 1706 Full Code [id number]  Hillary Bow, MD ED   08/31/2012 0205 09/02/2012 1747 Full Code [id number]  Orvan Falconer, MD Inpatient    Advance Directive Documentation     Most Recent Value  Type of Advance Directive  Healthcare Power of [place], Living will, Out of facility DNR (pink MOST or yellow form)  Pre-existing out of facility DNR order (yellow form or pink MOST form)  Physician notified to receive inpatient order  "MOST" Form in Place?  -      TOTAL TIME TAKING CARE OF THIS PATIENT ON DAY OF DISCHARGE: more than 35 minutes.   Saundra Shelling M.D on 11/29/2017 at 2:24 PM  Between  7am to 6pm - Pager - [phone]  After 6pm go to [email or web address] - password [email or web address] [facility]  Office  [phone]  CC: Primary care physician; Derinda Late, MD  Note: This dictation was prepared with Dragon dictation along with smaller phrase technology. Any transcriptional errors that result from this process are unintentional.

## 2017-11-29 NOTE — Evaluation (Signed)
Occupational Therapy Evaluation Patient Details Name: Brittany [name] MRN: [id number] DOB: June 15, 1950 Today's Date: 11/29/2017    History of Present Illness Brittany [name] is a 67 y.o. female with medical history as listed below who presents for evaluation of acute onset and severe neurological symptoms including right-sided facial droop, difficulty with speech, difficulty with balance, and weakness on the right side of her body.  She is alert and oriented.    Clinical Impression   Pt seen for OT evaluation this date. Pt presents with blurry vision in L eye (baseline since March), decreased RUE strength, and impaired dynamic standing balance. Coordination and sensation grossly WFL. No additional visual deficits appreciated. Pt was able to perform fine motor tasks standing at the sink with close SBA and no LOB noted. Pt requires CGA for standing/transitional movements to complete LB ADL tasks for safety/balance. Pt would benefit from skilled OT services to address noted impairments and functional deficits in order to maximize safety and return to PLOF/independence. Pt lives alone and was completely independent and working prior to admission. Pt is eager to return to PLOF. Pt would benefit from [facility] services and intermittent supervision initially upon discharge from the hospital.     Follow Up Recommendations  Home health OT;Supervision - Intermittent    Equipment Recommendations  None recommended by OT    Recommendations for Other Services       Precautions / Restrictions Precautions Precautions: Fall Restrictions Weight Bearing Restrictions: No      Mobility Bed Mobility Overal bed mobility: Independent                Transfers Overall transfer level: Needs assistance Equipment used: None Transfers: Sit to/from Stand Sit to Stand: Supervision;Min guard              Balance Overall balance assessment: Needs assistance Sitting-balance support: No upper extremity  supported;Feet unsupported Sitting balance-Leahy Scale: Good     Standing balance support: No upper extremity supported Standing balance-Leahy Scale: Fair                             ADL either performed or assessed with clinical judgement   ADL Overall ADL's : Needs assistance/impaired Eating/Feeding: Sitting;Independent   Grooming: Sitting;Independent   Upper Body Bathing: Sitting;Supervision/ safety;Set up   Lower Body Bathing: Sit to/from stand;Min guard;Supervison/ safety   Upper Body Dressing : Sitting;Supervision/safety;Set up   Lower Body Dressing: Sit to/from stand;Supervision/safety Lower Body Dressing Details (indicate cue type and reason): Pt able to adjust/don socks long sitting in bed without difficulty, CGA for transitional movements to complete LB dressing over hips Toilet Transfer: Ambulation;Regular Toilet;Min guard   Toileting- Clothing Manipulation and Hygiene: Independent       Functional mobility during ADLs: Min guard       Vision Baseline Vision/History: Wears glasses(blurry L eye ) Wears Glasses: At all times Patient Visual Report: No change from baseline Additional Comments: visual impairment in L eye at baseline, no significant functional deficits noted during assessment     Perception     Praxis      Pertinent Vitals/Pain Pain Assessment: No/denies pain     Hand Dominance Left   Extremity/Trunk Assessment Upper Extremity Assessment Upper Extremity Assessment: RUE deficits/detail(LUE 4+/5 grossly WFL) RUE Deficits / Details: shoulder flexion 3/5, all else 3+/5, intact sensation, slightly increased time needed for [facility], no significant functional deficits noted RUE Sensation: WNL   Lower Extremity Assessment Lower Extremity  Assessment: Generalized weakness;Defer to PT evaluation   Cervical / Trunk Assessment Cervical / Trunk Assessment: Normal   Communication Communication Communication: (slightly slurred speech)    Cognition Arousal/Alertness: Awake/alert Behavior During Therapy: WFL for tasks assessed/performed Overall Cognitive Status: Within Functional Limits for tasks assessed                                     General Comments       Exercises Other Exercises Other Exercises: Pt stood at the sink and performed bilateral FM tasks with close SBA and no LOB noted.   Shoulder Instructions      Home Living Family/patient expects to be discharged to:: Private residence Living Arrangements: Alone Available Help at Discharge: Family;Available PRN/intermittently Type of Home: House Home Access: Level entry     Home Layout: Two level;Able to live on main level with bedroom/bathroom     Bathroom Shower/Tub: Occupational psychologist: Standard     Home Equipment: None          Prior Functioning/Environment Level of Independence: Independent        Comments: Pt indep with mobility and ADL, working full time Scientist, [employer], on her feet 8hrs/day). Reports 5 falls in past 12 months 2/2 BLE weakness (R>L)        OT Problem List: Decreased strength;Decreased knowledge of use of DME or AE;Impaired UE functional use;Impaired balance (sitting and/or standing)      OT Treatment/Interventions:      OT Goals(Current goals can be found in the care plan section) Acute Rehab OT Goals Patient Stated Goal: to go home OT Goal Formulation: With patient Time For Goal Achievement: 12/13/17 Potential to Achieve Goals: Good ADL Goals Pt Will Transfer to Toilet: with supervision;ambulating;regular height toilet(LRAD for amb) Additional ADL Goal #1: Pt will independently set up medication based on proper dose using compensatory strategies as needed with no verbal cues to maximize safety.  OT Frequency:     Barriers to D/C:            Co-evaluation              AM-PAC PT "6 Clicks" Daily Activity     Outcome Measure Help from another person eating meals?:  None Help from another person taking care of personal grooming?: None Help from another person toileting, which includes using toliet, bedpan, or urinal?: A Little Help from another person bathing (including washing, rinsing, drying)?: A Little Help from another person to put on and taking off regular upper body clothing?: None Help from another person to put on and taking off regular lower body clothing?: A Little 6 Click Score: 21   End of Session Equipment Utilized During Treatment: Gait belt  Activity Tolerance: Patient tolerated treatment well Patient left: in bed;with call bell/phone within reach;with bed alarm set  OT Visit Diagnosis: Other abnormalities of gait and mobility (R26.89);Repeated falls (R29.6);Muscle weakness (generalized) (M62.81)                Time: 1610-9604 OT Time Calculation (min): 25 min Charges:  OT General Charges $OT Visit: 1 Visit OT Evaluation $OT Eval Low Complexity: 1 Low OT Treatments $Self Care/Home Management : 8-22 mins  Jeni Salles, MPH, MS, OTR/L ascom [phone] 11/29/17, 10:49 AM

## 2017-11-29 NOTE — Progress Notes (Signed)
SLP Cancellation Note  Patient Details Name: CORLEEN OTWELL MRN: [id number] DOB: 1950/08/19   Cancelled treatment:       Reason Eval/Treat Not Completed: SLP screened, no needs identified, will sign off(chart reviewed; consulted NSG then met w/ pt).  Pt denied any difficulty swallowing and is currently on a regular diet; tolerates swallowing pills w/ water per NSG. Pt conversed at conversational level w/out deficits noted. Pt denied any new speech-language deficits; family had reported the same - pt speaks w/ a more closed mouth, overbite. No further skilled ST services indicated as pt appears at her baseline. Pt agreed. NSG to reconsult if any change in status.    Orinda Kenner, MS, CCC-SLP Watson,Katherine 11/29/2017, 10:46 AM

## 2017-11-29 NOTE — Progress Notes (Signed)
Patient discharged home with home health per MD order. All discharge instructions given and all questions answered. 

## 2017-11-29 NOTE — Care Management (Signed)
Discharge to home today per Dr. Estanislado Pandy. Discussed Home Health agencies. [place]. Floydene Flock, [facility] representative updated.  Sister will transport. Shelbie Ammons RN MSN CCM Care Management [phone]

## 2017-11-30 LAB — HIV ANTIBODY (ROUTINE TESTING W REFLEX): HIV Screen 4th Generation wRfx: NONREACTIVE

## 2017-12-01 ENCOUNTER — Inpatient Hospital Stay: Admit: 2017-12-01 | Payer: MEDICARE | Primary: Family Medicine

## 2017-12-01 ENCOUNTER — Inpatient Hospital Stay: Admit: 2017-12-01 | Primary: Family Medicine

## 2017-12-01 ENCOUNTER — Inpatient Hospital Stay: Payer: MEDICARE | Primary: Family Medicine

## 2017-12-01 LAB — EKG 12-LEAD
Atrial Rate: 78 {beats}/min
Diagnosis: NORMAL
P Axis: 34 degrees
P-R Interval: 132 ms
Q-T Interval: 376 ms
QRS Duration: 82 ms
QTc Calculation (Bazett): 428 ms
R Axis: 3 degrees
T Axis: 82 degrees
Ventricular Rate: 78 {beats}/min

## 2017-12-01 LAB — EKG, 12 LEAD, INITIAL
Atrial Rate: 78 {beats}/min
Calculated P Axis: 34 degrees
Calculated R Axis: 3 degrees
Calculated T Axis: 82 degrees
Diagnosis: NORMAL
P-R Interval: 132 ms
Q-T Interval: 376 ms
QRS Duration: 82 ms
QTC Calculation (Bezet): 428 ms
Ventricular Rate: 78 {beats}/min

## 2017-12-01 LAB — LABCORP SPECIMEN COLLECTION

## 2017-12-01 NOTE — Progress Notes (Signed)
[facility]  [address]  [facility], [address]    Patient's Name: Cheryl Paul   Age: 67 y.o.  Date of Birth: July 26, 1950   Sex: female    Date:   12/01/2017    Insurance:              Session: 4 of  6  Revision:     Surgeon:  Dr. Pernell DupreAdams    Height: 5 f 3   Weight:    273      Lbs.     BMI: 48.4   Pounds Lost since last month: 0                 Pounds Gained since last month: 0    Starting Weight: 279     Previous Month???s Weight: 273  Overall Pounds Lost: 6   Overall Pounds Gained: 0      Do you smoke?  None    Alcohol intake:  Number of drinks at a time:  None  Number of times a week: None    Class Guidelines    Guidelines are reviewed with patient at the start of every class.    1. Patient understands that weight loss trial classes must be consecutive.  Patient understands if they miss a class, it is their responsibility to contact me to reschedule class.  I will reach out to patient after their first no show.  2.  Patient understands the expectations that weight maintenance/weight loss is expected during the classes.  Failure to demonstrate changes may result in one extra month of weight loss trial, followed by going back to see the surgeon.  Patient understands that they CAN NOT gain any weight during the weight loss trial.  Gaining weight will result in extra classes.  3. Patient is also instructed to be doing their labs, blood work, psych visit, support group and any other test that the surgeon has used while they are working on their weight loss trial.  4.  Patient was instructed to bring their blue binder to every class and appointment.      Other Pertinent Information:     Changes Made Since Last Class: Stopped sweet tea.    Eating Habits and Behaviors    Today in class, we started talking about the key diet principles.  We first focused on stopping liquid calories.  Patient was also educated on  carbohydrates.  Patient was instructed to start cutting out bread, rice, and pasta from the diet and start focusing more on meat and vegetables.      I then gave a power point, which focused on Label Reading.  In class, I gave patients a labels and we worked through a series of questions to help patients have a better understanding of label reading.  Patient was instructed to review the serving size.  Patient was encouraged to focus on protein and carbohydrates.  We also did a few label reading activities to help the patient become more familiar with label reading.      Patient's current diet habits include: 3 meals per day.  Patient is eating 2 scrambled eggs and 2 slices of [place] bacon for breakfast.  Lunch is chicken sandwich or a protein shake.  Dinner is baked chicken and broccoli.  She is snacking on cashews and almonds.  SHe is drinking 12 ounces of water Crystal Light and 24 ounces of water, which we talked about  the importance of increasing her fluid.          Physical Activity/Exercise    Comments:    We talked about exercise.  Patient was given reasons of why exercise is so important and how that can help with their long-term success.  I have encouraged patient to get a support system to help with the activity.    Currently for activity, patient is doing very little stating it hurts so bad in her knees.      Behavior Modification       Comments:  Behavior modifications were reinforced.  This included not eating in front of the TV, which could lead to bigger portions and eating when one is not hungry.  We also talked about the importance of eating 3 meals per day.  Patient was encouraged to food journal to keep their daily carbohydrates less than 30 grams per meal.      Goals that patient wants to work on includes:  1. Cut out all bread.  2. Lose more weight by next month.      Adela PortsJacqueline Browning, MS RD  12/01/2017

## 2017-12-01 NOTE — Progress Notes (Signed)
 [facility]  [address] [facility], [address]    Patient's Name: Cheryl Paul   Age: 67 y.o.  Date of Birth: 08-27-1950   Sex: female    Date:   12/01/2017    Insurance:              Session: 4 of  6  Revision:     Surgeon:  Dr. Myra    Height: 5 f 3   Weight:    273      Lbs.     BMI: 48.4   Pounds Lost since last month: 0                 Pounds Gained since last month: 0    Starting Weight: 279     Previous Month's Weight: 273  Overall Pounds Lost: 6   Overall Pounds Gained: 0      Do you smoke?  None    Alcohol intake:  Number of drinks at a time:  None  Number of times a week: None    Class Guidelines    Guidelines are reviewed with patient at the start of every class.    1. Patient understands that weight loss trial classes must be consecutive.  Patient understands if they miss a class, it is their responsibility to contact me to reschedule class.  I will reach out to patient after their first no show.  2.  Patient understands the expectations that weight maintenance/weight loss is expected during the classes.  Failure to demonstrate changes may result in one extra month of weight loss trial, followed by going back to see the surgeon.  Patient understands that they CAN NOT gain any weight during the weight loss trial.  Gaining weight will result in extra classes.  3. Patient is also instructed to be doing their labs, blood work, psych visit, support group and any other test that the surgeon has used while they are working on their weight loss trial.  4.  Patient was instructed to bring their blue binder to every class and appointment.      Other Pertinent Information:     Changes Made Since Last Class: Stopped sweet tea.    Eating Habits and Behaviors    Today in class, we started talking about the key diet principles.  We first focused on stopping liquid calories.  Patient was also educated on carbohydrates.  Patient was instructed to start  cutting out bread, rice, and pasta from the diet and start focusing more on meat and vegetables.      I then gave a power point, which focused on Label Reading.  In class, I gave patients a labels and we worked through a series of questions to help patients have a better understanding of label reading.  Patient was instructed to review the serving size.  Patient was encouraged to focus on protein and carbohydrates.  We also did a few label reading activities to help the patient become more familiar with label reading.      Patient's current diet habits include: 3 meals per day.  Patient is eating 2 scrambled eggs and 2 slices of [place] bacon for breakfast.  Lunch is chicken sandwich or a protein shake.  Dinner is baked chicken and broccoli.  She is snacking on cashews and almonds.  SHe is drinking 12 ounces of water Crystal Light and 24 ounces of water, which we talked about  the importance of increasing her fluid.          Physical Activity/Exercise    Comments:    We talked about exercise.  Patient was given reasons of why exercise is so important and how that can help with their long-term success.  I have encouraged patient to get a support system to help with the activity.    Currently for activity, patient is doing very little stating it hurts so bad in her knees.      Behavior Modification       Comments:  Behavior modifications were reinforced.  This included not eating in front of the TV, which could lead to bigger portions and eating when one is not hungry.  We also talked about the importance of eating 3 meals per day.  Patient was encouraged to food journal to keep their daily carbohydrates less than 30 grams per meal.      Goals that patient wants to work on includes:  1. Cut out all bread.  2. Lose more weight by next month.      Arlyne Schlossman, MS RD  12/01/2017

## 2017-12-04 LAB — COMPREHENSIVE METABOLIC PANEL
ALT: 10 IU/L (ref 0–32)
AST: 10 IU/L (ref 0–40)
Albumin/Globulin Ratio: 1.8 NA (ref 1.2–2.2)
Albumin: 4.4 g/dL (ref 3.6–4.8)
Alkaline Phosphatase: 54 IU/L (ref 39–117)
BUN: 11 mg/dL (ref 8–27)
Bun/Cre Ratio: 15 NA (ref 12–28)
CO2: 24 mmol/L (ref 20–29)
Calcium: 9.6 mg/dL (ref 8.7–10.3)
Chloride: 101 mmol/L (ref 96–106)
Creatinine: 0.72 mg/dL (ref 0.57–1.00)
EGFR IF NonAfrican American: 88 mL/min/{1.73_m2} (ref >59)
GFR African American: 101 mL/min/{1.73_m2} (ref >59)
Globulin, Total: 2.5 g/dL (ref 1.5–4.5)
Glucose: 254 mg/dL — ABNORMAL HIGH (ref 65–99)
Potassium: 4.5 mmol/L (ref 3.5–5.2)
Sodium: 141 mmol/L (ref 134–144)
Total Bilirubin: 0.4 mg/dL (ref 0.0–1.2)
Total Protein: 6.9 g/dL (ref 6.0–8.5)

## 2017-12-04 LAB — CBC WITH AUTO DIFFERENTIAL
Basophils %: 1 %
Basophils Absolute: 0 10*3/uL (ref 0.0–0.2)
Eosinophils %: 3 %
Eosinophils Absolute: 0.2 10*3/uL (ref 0.0–0.4)
Granulocyte Absolute Count: 0 10*3/uL (ref 0.0–0.1)
Hematocrit: 43.7 % (ref 34.0–46.6)
Hemoglobin: 13.4 g/dL (ref 11.1–15.9)
Immature Granulocytes: 0 %
Lymphocytes %: 27 %
Lymphocytes Absolute: 2.1 10*3/uL (ref 0.7–3.1)
MCH: 26.7 pg (ref 26.6–33.0)
MCHC: 30.7 g/dL — ABNORMAL LOW (ref 31.5–35.7)
MCV: 87 fL (ref 79–97)
Monocytes %: 4 %
Monocytes Absolute: 0.3 10*3/uL (ref 0.1–0.9)
Neutrophils %: 65 %
Neutrophils Absolute: 5.1 10*3/uL (ref 1.4–7.0)
Platelets: 173 10*3/uL (ref 150–450)
RBC: 5.01 x10E6/uL (ref 3.77–5.28)
RDW: 14.1 % (ref 12.3–15.4)
WBC: 7.8 10*3/uL (ref 3.4–10.8)

## 2017-12-04 LAB — LIPID PANEL
Cholesterol, Total: 182 mg/dL (ref 100–199)
Cholesterol, total: 182 mg/dL (ref 100–199)
HDL Cholesterol: 41 mg/dL (ref >39)
HDL: 41 mg/dL (ref >39)
LDL Calculated: 87 mg/dL (ref 0–99)
LDL, calculated: 87 mg/dL (ref 0–99)
Triglyceride: 268 mg/dL — ABNORMAL HIGH (ref 0–149)
Triglycerides: 268 mg/dL — ABNORMAL HIGH (ref 0–149)
VLDL Cholesterol Calculated: 54 mg/dL — ABNORMAL HIGH (ref 5–40)
VLDL, calculated: 54 mg/dL — ABNORMAL HIGH (ref 5–40)

## 2017-12-04 LAB — VITAMIN B12 & FOLATE
Folate: 9 ng/mL (ref >3.0)
Folate: 9 ng/mL (ref >3.0)
Vitamin B-12: 582 pg/mL (ref 232–1245)
Vitamin B12: 582 pg/mL (ref 232–1245)

## 2017-12-04 LAB — IRON
Iron: 63 ug/dL (ref 27–139)
Iron: 63 ug/dL (ref 27–139)

## 2017-12-04 LAB — TSH 3RD GENERATION
TSH: 3.91 u[IU]/mL (ref 0.450–4.500)
TSH: 3.91 u[IU]/mL (ref 0.450–4.500)

## 2017-12-04 LAB — HEMOGLOBIN A1C W/O EAG
Hemoglobin A1C: 9.8 % — ABNORMAL HIGH (ref 4.8–5.6)
Hemoglobin A1c: 9.8 % — ABNORMAL HIGH (ref 4.8–5.6)

## 2017-12-04 LAB — VITAMIN B1, WHOLE BLOOD
Vitamin B1,Whole Blood: 148 nmol/L (ref 66.5–200.0)
Vitamin B1: 148 nmol/L (ref 66.5–200.0)

## 2017-12-04 LAB — VITAMIN D 25 HYDROXY: Vit D, 25-Hydroxy: 23.4 ng/mL — ABNORMAL LOW (ref 30.0–100.0)

## 2017-12-04 LAB — H PYLORI AB, IGG, QT
H PYLORI AB IGG: <0.8 Index Value (ref 0.00–0.79)
H pylori Ab IgG: <0.8 Index Value (ref 0.00–0.79)

## 2017-12-04 LAB — CBC WITH AUTOMATED DIFF
ABS. BASOPHILS: 0 10*3/uL (ref 0.0–0.2)
ABS. EOSINOPHILS: 0.2 10*3/uL (ref 0.0–0.4)
ABS. IMM. GRANS.: 0 10*3/uL (ref 0.0–0.1)
ABS. MONOCYTES: 0.3 10*3/uL (ref 0.1–0.9)
ABS. NEUTROPHILS: 5.1 10*3/uL (ref 1.4–7.0)
Abs Lymphocytes: 2.1 10*3/uL (ref 0.7–3.1)
BASOPHILS: 1 %
EOSINOPHILS: 3 %
HCT: 43.7 % (ref 34.0–46.6)
HGB: 13.4 g/dL (ref 11.1–15.9)
IMMATURE GRANULOCYTES: 0 %
Lymphocytes: 27 %
MCH: 26.7 pg (ref 26.6–33.0)
MCHC: 30.7 g/dL — ABNORMAL LOW (ref 31.5–35.7)
MCV: 87 fL (ref 79–97)
MONOCYTES: 4 %
NEUTROPHILS: 65 %
PLATELET: 173 10*3/uL (ref 150–450)
RBC: 5.01 x10E6/uL (ref 3.77–5.28)
RDW: 14.1 % (ref 12.3–15.4)
WBC: 7.8 10*3/uL (ref 3.4–10.8)

## 2017-12-04 LAB — METABOLIC PANEL, COMPREHENSIVE
A-G Ratio: 1.8 (ref 1.2–2.2)
ALT (SGPT): 10 IU/L (ref 0–32)
AST (SGOT): 10 IU/L (ref 0–40)
Albumin: 4.4 g/dL (ref 3.6–4.8)
Alk. phosphatase: 54 IU/L (ref 39–117)
BUN/Creatinine ratio: 15 (ref 12–28)
BUN: 11 mg/dL (ref 8–27)
Bilirubin, total: 0.4 mg/dL (ref 0.0–1.2)
CO2: 24 mmol/L (ref 20–29)
Calcium: 9.6 mg/dL (ref 8.7–10.3)
Chloride: 101 mmol/L (ref 96–106)
Creatinine: 0.72 mg/dL (ref 0.57–1.00)
GFR est AA: 101 mL/min/{1.73_m2} (ref >59)
GFR est non-AA: 88 mL/min/{1.73_m2} (ref >59)
GLOBULIN, TOTAL: 2.5 g/dL (ref 1.5–4.5)
Glucose: 254 mg/dL — ABNORMAL HIGH (ref 65–99)
Potassium: 4.5 mmol/L (ref 3.5–5.2)
Protein, total: 6.9 g/dL (ref 6.0–8.5)
Sodium: 141 mmol/L (ref 134–144)

## 2017-12-04 LAB — VITAMIN D, 25 HYDROXY: VITAMIN D, 25-HYDROXY: 23.4 ng/mL — ABNORMAL LOW (ref 30.0–100.0)

## 2017-12-09 ENCOUNTER — Encounter: Attending: Nurse Practitioner | Primary: Family Medicine

## 2017-12-17 ENCOUNTER — Ambulatory Visit: Attending: Nurse Practitioner | Primary: Family Medicine

## 2017-12-17 ENCOUNTER — Ambulatory Visit: Admit: 2017-12-17 | Discharge: 2017-12-17 | Payer: MEDICARE | Attending: Nurse Practitioner | Primary: Family Medicine

## 2017-12-17 NOTE — Progress Notes (Signed)
Chief Complaint   Patient presents with   ??? Follow-up     bariatric program   1. Have you been to the ER, urgent care clinic since your last visit?  Hospitalized since your last visit?No    2. Have you seen or consulted any other health care providers outside of the [facility] since your last visit?  Include any pap smears or colon screening. No      Pt ID confirmed    Weight Loss Metrics 12/17/2017 12/17/2017 10/08/2017 09/24/2017 08/20/2017 08/16/2017 05/24/2017   Pre op / Initial Wt 274 - - - - - -   Today's Wt - 274 lb 277 lb 277 lb 278 lb 3.2 oz 279 lb 3.2 oz 280 lb   BMI - 48.54 kg/m2 49.07 kg/m2 49.07 kg/m2 49.28 kg/m2 49.46 kg/m2 49.6 kg/m2   Ideal Body Wt 128 - - - - - -   Excess Body Wt 146 - - - - - -   Goal Wt 157 - - - - - -   Wt loss to date 0 - - - - - -   % Wt Loss 0 - - - - - -   80% EBW 116.8 - - - - - -       Body mass index is 48.54 kg/m??.

## 2017-12-17 NOTE — Progress Notes (Signed)
Bariatric Preoperative Progress Note    Subjective:     Cheryl Paul is a 67 y.o. female who presents today for followup of their candidacy for bariatric surgery.  Since last seen, [name] has been working through bariatric program towards LGBP.    Past Medical History:   Diagnosis Date   ??? Arthritis     knee, arms,    ??? Asthma    ??? Chronic obstructive pulmonary disease ([place])    ??? Chronic pain    ??? Diabetes ([place])    ??? H/O sinusitis    ??? Hypercholesterolemia    ??? Hypertension    ??? Toe fracture, left August 2015    4th toe       Past Surgical History:   Procedure Laterality Date   ??? ABDOMEN SURGERY PROC UNLISTED      4 surguries for blockages   ??? HX CARPAL TUNNEL RELEASE Bilateral    ??? HX CHOLECYSTECTOMY     ??? HX GYN     ??? HX HERNIA REPAIR      abdomen   ??? HX HYSTERECTOMY      tubes tied   ??? HX ORTHOPAEDIC      catrpal tunnel right and left   ??? HX POLYPECTOMY  04/18/14       Current Outpatient Medications   Medication Sig Dispense Refill   ??? HYDROcodone-acetaminophen (NORCO) 7.5-325 mg per tablet hydrocodone 7.5 mg-acetaminophen 325 mg tablet     ??? albuterol (PROVENTIL HFA, VENTOLIN HFA, PROAIR HFA) 90 mcg/actuation inhaler Take 1-2 Puffs by inhalation every four (4) hours as needed for Wheezing. 1 Inhaler 0   ??? insulin glargine (TOUJEO SOLOSTAR U-300 INSULIN) 300 unit/mL (1.5 mL) inpn 40 Units by SubCUTAneous route daily.     ??? ergocalciferol (ERGOCALCIFEROL) 50,000 unit capsule Take 1 Cap by mouth every seven (7) days. 8 Cap 0   ??? glucose blood VI test strips (ONETOUCH VERIO) strip Check blood sugar daily. DX E11.9 100 Strip 3   ??? atorvastatin (LIPITOR) 20 mg tablet TAKE ONE TABLET BY MOUTH ONE TIME DAILY 90 Tab 2   ??? lisinopril (PRINIVIL, ZESTRIL) 10 mg tablet TAKE 1 TABLET BY MOUTH DAILY 90 Tab 1   ??? nystatin (MYCOSTATIN) topical cream Apply  to affected area two (2) times a day. 15 g 1   ??? glipiZIDE (GLUCOTROL) 5 mg tablet TAKE ONE TABLET BY MOUTH TWICE DAILY 180 Tab 1    ??? lancets (ONE TOUCH DELICA) 33 gauge misc Use daily to test blood sugar    E11.9 100 Lancet 3   ??? metFORMIN (GLUCOPHAGE) 1,000 mg tablet Take 1 Tab by mouth two (2) times daily (with meals). 180 Tab 1   ??? Lancing Device with Lancets (ONE TOUCH DELICA) kit Test blood sugar daily DX E11.9 1 Kit 0   ??? alcohol swabs padm Use daily to test blood sugar  DX E11.9 100 Pad 1   ??? glucose blood VI test strips (ADVOCATE REDI-CODE+) strip Check blood sugar daily before breakfast   DX E11.9 50 Strip 3   ??? ULTRA THIN LANCETS 31 gauge misc      ??? budesonide-formoterol (SYMBICORT) 80-4.5 mcg/actuation HFAA inhaler Take 2 Puffs by inhalation two (2) times a day. 3 Inhaler 1   ??? fluticasone (FLONASE) 50 mcg/actuation nasal spray 2 Sprays by Both Nostrils route daily. 1 Bottle 0       Allergies   Allergen Reactions   ??? Penicillins Itching   ??? Butrans [  Buprenorphine] Rash   ??? Codeine Other (comments)     Nausea and abd pain   ??? Iodine Rash   ??? Lactose Diarrhea     Lactose intolerance       ROS:  Review of Systems:  Positives in [place]    CONST: Fever, weight loss, fatigue or chills  GI: Nausea, vomiting, abdominal pain, change in bowel habits, hematochezia, melena, and GERD   INTEG: Dermatitis, abnormal moles  HEENT: Recent changes in vision, vertigo, epistaxis, dysphagia and hoarseness  CV: Chest pain, palpitations, HTN, edema and varicosities  RESP: Cough, shortness of breath, wheezing, hemoptysis, snoring and reactive airway disease  GU: Hematuria, dysuria, frequency, urgency, nocturia and stress urinary incontinence   MS: Weakness, joint pain and arthritis- bilateral knees  ENDO: Diabetes, thyroid disease, polyuria, polydipsia, polyphagia, poor wound healing, heat intolerance, cold intolerance  LYMPH/HEME: Anemia, bruising and history of blood transfusions  NEURO: Dizziness, headache, fainting, seizures and stroke  PSYCH: Anxiety and depression      Remainder of ROS as per HPI.          Objective:     Physical Exam:  Visit Vitals   BP 120/76   Pulse 78   Temp 97.1 ??F (36.2 ??C)   Resp 18   Ht 5' 3"  (1.6 m)   Wt 124.3 kg (274 lb)   SpO2 98%   BMI 48.54 kg/m??         General: AAOX3, pleasant and cooperative to exam. Appropriately groomed. NAD. Non-toxic in appearance. Appears stated age.   HENT: NC/AT. PERRLA. Extraocular motions are intact. Sclera anicteric, Conjunctiva Clear. Nares clear. Oropharynx pink, moist without exudate or erythema.   Neck:  Supple, trachea is midline. No JVD, Lymphadenopathy. No bruits.  Chest: Good equal bilateral expansion  Lungs: Clear to auscultation bilaterally without e/o crackles, wheezes or rhales.   Heart: RRR, S1 and S2 noted. No c/r/m/g.  Abdomen: obese, soft and non-tender without distension. Good bowel sounds. No vis/palp masses or pulsations. No organo-splenomegaly. No hernias to my exam.  Pelvis: Stable.   GU:  Deferred  Rectal: Deferred  Extremities: Positive pulses in all 4 extremities. Baseline range of motion in all 4 extremities. Strength, sensation and reflexes intact, appropriate and equal in b/l upper and lower extremities  Neuro: CN II-XII grossly intact without focal deficit. Ambulatory.  Skin: Clean, warm and dry.        Studies to date:    Labs: significant for HgbA1C 9.8. Hypo vitamin D     EKG: completed. Reviewed      Nutritional evaluation: 4/6 completed     Psychiatric evaluation: completed with Dr. Valene Bors 09/21/17- need to obtain clearance letter     Support Group:  Attended     Additional evaluations: Cardiology cleared for bariatric surgery   Pulmonology follow up scheduled for 12/22/17         Assessment:   Cheryl Paul is a 67 y.o. female who is progressing through the bariatric preoperative evaluation.  At this time, they are an appropriate candidate for weight loss surgery.    Ms. Lascola has a reminder for a "due or due soon" health maintenance. I have asked that she contact her primary care provider for follow-up on this health maintenance.      Plan:    -complete remainder of preop evaluation including  Nutrition classes, Psych clearance, Pulm clearance   -Patient voices understanding that weight gain during weight loss trial may result in cancellation of weight loss  surgery.   -Follow up once has completed entirety of weight loss workup to determine next steps.    - Pre-op with Dr. Frederico Hamman end of September 2019        Luiz Ochoa, FNP-BC

## 2017-12-17 NOTE — Progress Notes (Signed)
Progress Notes by Janann Colonel, NP at 12/17/17 1300                Author: Janann Colonel, NP  Service: --  Author Type: Nurse Practitioner       Filed: 12/17/17 1428  Encounter Date: 12/17/2017  Status: Signed          Editor: Terrell Shimko, Tanda Rockers, NP (Nurse Practitioner)                             Bariatric Preoperative Progress Note      Subjective:       Cheryl Paul is a 67 y.o.  female who presents today for followup of their candidacy for bariatric surgery.  Since last seen, Cheryl Paul  has been working through bariatric program towards LGBP.        Past Medical History:        Diagnosis  Date         ?  Arthritis            knee, arms,          ?  Asthma       ?  Chronic obstructive pulmonary disease (HCC)       ?  Chronic pain       ?  Diabetes ([place])       ?  H/O sinusitis       ?  Hypercholesterolemia       ?  Hypertension       ?  Toe fracture, left  August 2015          4th toe             Past Surgical History:         Procedure  Laterality  Date          ?  ABDOMEN SURGERY PROC UNLISTED              4 surguries for blockages          ?  HX CARPAL TUNNEL RELEASE  Bilateral       ?  HX CHOLECYSTECTOMY         ?  HX GYN         ?  HX HERNIA REPAIR              abdomen          ?  HX HYSTERECTOMY              tubes tied          ?  HX ORTHOPAEDIC              catrpal tunnel right and left          ?  HX POLYPECTOMY    04/18/14             Current Outpatient Medications          Medication  Sig  Dispense  Refill           ?  HYDROcodone-acetaminophen (NORCO) 7.5-325 mg per tablet  hydrocodone 7.5 mg-acetaminophen 325 mg tablet         ?  albuterol (PROVENTIL HFA, VENTOLIN HFA, PROAIR HFA) 90 mcg/actuation inhaler  Take 1-2 Puffs by inhalation every four (4) hours as needed for Wheezing.  1 Inhaler  0     ?  insulin glargine (TOUJEO SOLOSTAR U-300  INSULIN) 300 unit/mL (1.5 mL) inpn  40 Units by SubCUTAneous route daily.         ?  ergocalciferol (ERGOCALCIFEROL) 50,000 unit capsule  Take 1  Cap by mouth every seven (7) days.  8 Cap  0     ?  glucose blood VI test strips (ONETOUCH VERIO) strip  Check blood sugar daily. DX E11.9  100 Strip  3     ?  atorvastatin (LIPITOR) 20 mg tablet  TAKE ONE TABLET BY MOUTH ONE TIME DAILY  90 Tab  2     ?  lisinopril (PRINIVIL, ZESTRIL) 10 mg tablet  TAKE 1 TABLET BY MOUTH DAILY  90 Tab  1     ?  nystatin (MYCOSTATIN) topical cream  Apply  to affected area two (2) times a day.  15 g  1     ?  glipiZIDE (GLUCOTROL) 5 mg tablet  TAKE ONE TABLET BY MOUTH TWICE DAILY  180 Tab  1     ?  lancets (ONE TOUCH DELICA) 33 gauge misc  Use daily to test blood sugar    E11.9  100 Lancet  3     ?  metFORMIN (GLUCOPHAGE) 1,000 mg tablet  Take 1 Tab by mouth two (2) times daily (with meals).  180 Tab  1     ?  Lancing Device with Lancets (ONE TOUCH DELICA) kit  Test blood sugar daily DX E11.9  1 Kit  0     ?  alcohol swabs padm  Use daily to test blood sugar  DX E11.9  100 Pad  1     ?  glucose blood VI test strips (ADVOCATE REDI-CODE+) strip  Check blood sugar daily before breakfast   DX E11.9  50 Strip  3     ?  ULTRA THIN LANCETS 31 gauge misc           ?  budesonide-formoterol (SYMBICORT) 80-4.5 mcg/actuation HFAA inhaler  Take 2 Puffs by inhalation two (2) times a day.  3 Inhaler  1           ?  fluticasone (FLONASE) 50 mcg/actuation nasal spray  2 Sprays by Both Nostrils route daily.  1 Bottle  0             Allergies        Allergen  Reactions         ?  Penicillins  Itching     ?  Butrans [Buprenorphine]  Rash     ?  Codeine  Other (comments)             Nausea and abd pain         ?  Iodine  Rash     ?  Lactose  Diarrhea             Lactose intolerance           ROS:   Review of Systems:   Positives in [place]      CONST: Fever, weight loss, fatigue or chills   GI: Nausea, vomiting, abdominal pain, change in bowel habits, hematochezia, melena, and GERD   INTEG: Dermatitis, abnormal moles  HEENT: Recent changes in vision, vertigo, epistaxis, dysphagia and hoarseness  CV: Chest  pain, palpitations,  HTN, edema and varicosities  RESP: Cough, shortness of breath, wheezing, hemoptysis, snoring and reactive airway disease  GU: Hematuria, dysuria, frequency, urgency, nocturia and stress urinary  incontinence    MS: Weakness, joint pain and  arthritis- bilateral knees  ENDO: Diabetes , thyroid disease, polyuria, polydipsia, polyphagia, poor wound healing, heat intolerance, cold intolerance  LYMPH/HEME: Anemia, bruising and history of blood transfusions  NEURO: Dizziness, headache, fainting, seizures and stroke  PSYCH:  Anxiety and depression         Remainder of ROS as per HPI.                 Objective:        Physical Exam:   Visit Vitals      BP  120/76     Pulse  78     Temp  97.1 ??F (36.2 ??C)     Resp  18     Ht  5' 3"  (1.6 m)     Wt  124.3 kg (274 lb)     SpO2  98%        BMI  48.54 kg/m??              General: AAOX3, pleasant and cooperative to exam. Appropriately groomed. NAD. Non-toxic in appearance. Appears stated age.    HENT: NC/AT. PERRLA. Extraocular motions are intact. Sclera anicteric, Conjunctiva Clear. Nares clear. Oropharynx pink, moist without exudate  or erythema.    Neck:  Supple, trachea is midline. No JVD, Lymphadenopathy. No bruits.   Chest: Good equal bilateral expansion   Lungs: Clear to auscultation bilaterally without e/o crackles, wheezes or rhales.    Heart: RRR, S1 and S2 noted. No c/r/m/g.   Abdomen: obese, soft and non-tender without distension. Good bowel sounds. No vis/palp masses or pulsations. No organo-splenomegaly. No hernias  to my exam.   Pelvis: Stable.    GU:  Deferred   Rectal: Deferred   Extremities: Positive pulses in all 4 extremities. Baseline range of motion in all 4 extremities. Strength, sensation and reflexes intact,  appropriate and equal in b/l upper and lower extremities   Neuro: CN II-XII grossly intact without focal deficit. Ambulatory.   Skin: Clean, warm and dry.            Studies to date:      Labs: significant for HgbA1C 9.8. Hypo  vitamin D       EKG: completed. Reviewed        Nutritional evaluation: 4/6 completed       Psychiatric evaluation: completed with Dr. Valene Bors 09/21/17- need to obtain clearance letter       Support Group:  Attended       Additional evaluations: Cardiology cleared for bariatric surgery    Pulmonology follow up scheduled for 12/22/17               Assessment:     Cheryl Paul is a 67 y.o.  female who is progressing through the bariatric preoperative evaluation.  At this time, they  are an appropriate candidate for weight loss surgery.      Ms. Derden has a reminder for a  "due or due soon" health maintenance. I have asked that she contact her  primary care provider for follow-up on this health maintenance.           Plan:     -complete remainder of preop evaluation including  Nutrition classes, Psych clearance, Pulm clearance    -Patient voices understanding that weight gain during weight loss trial may result in cancellation of weight loss surgery.    -Follow up once has completed entirety of weight loss workup to determine next steps.     - Pre-op with  Dr. Frederico Hamman end of September 2019            Luiz Ochoa, FNP-BC

## 2017-12-17 NOTE — Progress Notes (Signed)
 Chief Complaint   Patient presents with   . Follow-up     bariatric program   1. Have you been to the ER, urgent care clinic since your last visit?  Hospitalized since your last visit?No    2. Have you seen or consulted any other health care providers outside of the [facility] since your last visit?  Include any pap smears or colon screening. No      Pt ID confirmed    Weight Loss Metrics 12/17/2017 12/17/2017 10/08/2017 09/24/2017 08/20/2017 08/16/2017 05/24/2017   Pre op / Initial Wt 274 - - - - - -   Today's Wt - 274 lb 277 lb 277 lb 278 lb 3.2 oz 279 lb 3.2 oz 280 lb   BMI - 48.54 kg/m2 49.07 kg/m2 49.07 kg/m2 49.28 kg/m2 49.46 kg/m2 49.6 kg/m2   Ideal Body Wt 128 - - - - - -   Excess Body Wt 146 - - - - - -   Goal Wt 157 - - - - - -   Wt loss to date 0 - - - - - -   % Wt Loss 0 - - - - - -   80% EBW 116.8 - - - - - -       Body mass index is 48.54 kg/m.

## 2017-12-23 NOTE — Telephone Encounter (Signed)
Left message for patient to call to let us know who her nephrologist is

## 2017-12-27 NOTE — Telephone Encounter (Signed)
Pt called 12/27/2017 stating "she does not have a kidney doctor at all." please advise.

## 2017-12-31 ENCOUNTER — Inpatient Hospital Stay: Admit: 2017-12-31 | Primary: Family Medicine

## 2017-12-31 NOTE — Progress Notes (Signed)
[facility], [address]    Patient's Name: Cheryl Paul   Age: 67 y.o.  Date of Birth: [birth year]   Sex: female    Date:   12/31/2017    Insurance:            Session: 5 of  6  Revision:   Surgeon:  Dr. Andree Elk    Height: 5 f 3 Weight:    273      Lbs.   BMI: 48.5   Pounds Lost since last month: 0               Pounds Gained since last month: 0      Starting Weight: 279   Previous Month???s Weight: 273  Overall Pounds Lost: 6 Overall Pounds Gained: 0      Do you smoke?  None    Alcohol intake:  Number of drinks at a time:  None  Number of times a week: None    Class Guidelines    Guidelines are reviewed with patient at the start of every class.    1. Patient understands that weight loss trial classes must be consecutive.  Patient understands if they miss a class, it is their responsibility to contact me to reschedule class.  I will reach out to patient after their first no show.  2.  Patient understands the expectations that weight maintenance/weight loss is expected during the classes.  Failure to demonstrate changes may result in one extra month of weight loss trial, followed by going back to see the surgeon.  Patient understands that they CAN NOT gain any weight during the weight loss trial.  Gaining weight will result in extra classes.  3. Patient is also instructed to be doing their labs, blood work, psych visit, support group and any other test that the surgeon has used while they are working on their weight loss trial.  4.  Patient was instructed to bring their blue binder to every class and appointment.      Other Pertinent Information:     Changes Made Since Last Class: Cutting down on sugar.  Cutting down on bread.    Eating Habits and Behaviors    Today in class, we reviewed the key diet principles.  I have talked to patient about pushing the fluid and working towards 64 ounces per day.  We  focused on following a low-calorie diet.  Patient was instructed to count their carbohydrates and try to keep their daily intake under 100 grams per day and try to keep their daily protein at 80 grams per day.  Patient was given examples of carbohydrates in starches.  Patient was encouraged to focus on meat and vegetables and begin cutting carbohydrates out.  We talked about foods that are protein-based and how to incorporate those into their meals.  I also reviewed with patient the importance of eating 3 meals per day and suggestions were made for breakfast items.    Patient's current diet habits include: 3 meals per day.  She is eating eggs, bacon, and sausage for breakfast.  Lunch is a chicken sandwich.  Dinner is broccoli and chicken.  She is snacking on almonds or cashews.  She is drinking 36 ounces of water per day.          Physical Activity/Exercise    Comments:    We talked about exercise.  Patient was given reasons of  why exercise is so important and how that can help with their long-term success.  I have encouraged patient to get a support system to help with the activity.    Currently for activity, patient is doing very little stating her hip hurts very badly.      Behavior Modification       Comments:  During today's lesson, I gave a presentation called The 100-200 Calorie "Mindless Margin."  The goal is to make modest daily 100-200 calorie reductions in certain things that the body won't notice.  One, 100-200 calorie change and would will look 10-20 pounds in one year.  An example could be cutting soda.  Patient was given a check off list and was encouraged to come up with 1-3 100 calories changes they could make.  The check off list is a daily tracker to see if these goals are being met.        Goals that patient wants to work on includes:  1. States she wants to cut out all fried food.  2. Give up bread.      Milana Obey, MS RD  12/31/2017

## 2017-12-31 NOTE — Progress Notes (Signed)
 [facility] [place] [facility]  [address] [facility], [address]    Patient's Name: Cheryl Paul   Age: 67 y.o.  Date of Birth: April 29, 1951   Sex: female    Date:   12/31/2017    Insurance:            Session: 5 of  6  Revision:   Surgeon:  Dr. Myra    Height: 5 f 3 Weight:    273      Lbs.   BMI: 48.5   Pounds Lost since last month: 0               Pounds Gained since last month: 0      Starting Weight: 279   Previous Month's Weight: 273  Overall Pounds Lost: 6 Overall Pounds Gained: 0      Do you smoke?  None    Alcohol intake:  Number of drinks at a time:  None  Number of times a week: None    Class Guidelines    Guidelines are reviewed with patient at the start of every class.    1. Patient understands that weight loss trial classes must be consecutive.  Patient understands if they miss a class, it is their responsibility to contact me to reschedule class.  I will reach out to patient after their first no show.  2.  Patient understands the expectations that weight maintenance/weight loss is expected during the classes.  Failure to demonstrate changes may result in one extra month of weight loss trial, followed by going back to see the surgeon.  Patient understands that they CAN NOT gain any weight during the weight loss trial.  Gaining weight will result in extra classes.  3. Patient is also instructed to be doing their labs, blood work, psych visit, support group and any other test that the surgeon has used while they are working on their weight loss trial.  4.  Patient was instructed to bring their blue binder to every class and appointment.      Other Pertinent Information:     Changes Made Since Last Class: Cutting down on sugar.  Cutting down on bread.    Eating Habits and Behaviors    Today in class, we reviewed the key diet principles.  I have talked to patient about pushing the fluid and working towards 64 ounces per day.  We focused on following a  low-calorie diet.  Patient was instructed to count their carbohydrates and try to keep their daily intake under 100 grams per day and try to keep their daily protein at 80 grams per day.  Patient was given examples of carbohydrates in starches.  Patient was encouraged to focus on meat and vegetables and begin cutting carbohydrates out.  We talked about foods that are protein-based and how to incorporate those into their meals.  I also reviewed with patient the importance of eating 3 meals per day and suggestions were made for breakfast items.    Patient's current diet habits include: 3 meals per day.  She is eating eggs, bacon, and sausage for breakfast.  Lunch is a chicken sandwich.  Dinner is broccoli and chicken.  She is snacking on almonds or cashews.  She is drinking 36 ounces of water per day.          Physical Activity/Exercise    Comments:    We talked about exercise.  Patient was given reasons of  why exercise is so important and how that can help with their long-term success.  I have encouraged patient to get a support system to help with the activity.    Currently for activity, patient is doing very little stating her hip hurts very badly.      Behavior Modification       Comments:  During today's lesson, I gave a presentation called The 100-200 Calorie Mindless Margin.  The goal is to make modest daily 100-200 calorie reductions in certain things that the body won't notice.  One, 100-200 calorie change and would will look 10-20 pounds in one year.  An example could be cutting soda.  Patient was given a check off list and was encouraged to come up with 1-3 100 calories changes they could make.  The check off list is a daily tracker to see if these goals are being met.        Goals that patient wants to work on includes:  1. States she wants to cut out all fried food.  2. Give up bread.      Arlyne Schlossman, MS RD  12/31/2017

## 2018-01-13 ENCOUNTER — Encounter: Attending: Specialist | Primary: Family Medicine

## 2018-01-27 ENCOUNTER — Inpatient Hospital Stay: Admit: 2018-01-27 | Primary: Family Medicine

## 2018-01-27 NOTE — Progress Notes (Signed)
Nutrition Evaluation    Patient's Name: Cheryl Paul   Age: 67 y.o.  Date of Birth: 07-05-1950   Sex: female    Height: 5 f 3 Weight: 273 BMI:  48.5  Starting Weight:  279    Smoking Status:  None  Alcohol Intake:  Number of Drinks at a Time: None  Number of Times a Week: None    Changes made during classes include:  Eating less  Eating 3 meals per day  Don't snack at all        Two things that patient learned during this weight loss trial:  Stay away from white bread and white flour  I have the power to do it.    Summary:  I feel that Cheryl MawBrenda T Holberg has demonstrated appropriate diet changes and is ready to move forward with surgery.  Patient has been briefed on the importance of the protein drinks, vitamins, and the transition of the diet stages. Patient understands that the long-term diet will focus on protein and vegetables.  Patient understand the effects of carbohydrates after surgery and what reactive hypoglycemia is.      Patient is aware that they will be attending pre-op class 2 weeks before surgery and will get more detailed information on the post-op diet guidelines.    Patient will see me again at 6 weeks post-op. At this 6 week visit, RD will assess how patient is tolerating soft protein and advance to vegetables, if tolerating soft protein without difficulty.  Patient will also see RD again at 9 months post-op.  This visit will assess patient's compliance with current protocol, including diet, vitamins, protein shakes, and exercise.  Post-op diet guidelines will be reinforced.  RD is available for questions and to meet with patient outside of the 6 week and 9 month post-op visit.     We spent a lot of time talking about the vitamins.  Patient understands the importance of being compliant with the diet protocol and the complications and risks that can occur if they are non-compliant with the nutritional protocol.     Patient has attended at least one support group.    Candidate for surgery: Yes   Re-evaluation Date:     Procedure:  Gastric Bypass       Adela PortsJacqueline Browning, MS RD  01/27/2018

## 2018-01-27 NOTE — Progress Notes (Signed)
[facility] [place] [facility]  [address] [facility] [address], [address]    Patient's Name: Cheryl Paul   Age: 67 y.o.  Date of Birth: Jun 06, 1950   Sex: female    Date:   01/27/2018    Insurance:            Session: 6 of  6  Revision:   Surgeon:  Dr. Pernell Dupre    Height: 5 f 3 Weight:    273      Lbs.   BMI:    Pounds Lost since last month: 0               Pounds Gained since last month: 0    Starting Weight: 279   Previous Month???s Weight: 279  Overall Pounds Lost: 6 Overall Pounds Gained: 0      Do you smoke?  None    Alcohol intake:  Number of drinks at a time:  None  Number of times a week: None    Class Guidelines    Guidelines are reviewed with patient at the start of every class.    1. Patient understands that weight loss trial classes must be consecutive.  Patient understands if they miss a class, it is their responsibility to contact me to reschedule class.  I will reach out to patient after their first no show.  2.  Patient understands the expectations that weight maintenance/weight loss is expected during the classes.  Failure to demonstrate changes may result in one extra month of weight loss trial, followed by going back to see the surgeon.    3. Patient is also instructed to be doing their labs, blood work, psych visit, support group and any other test that the surgeon has used while they are working on their weight loss trial.    Other Pertinent Information:     Changes Made Since Last Class: Not to gain any weight.    Eating Habits and Behaviors      Today we reviewed key diet principles.  We talked about ways that patient can follow a low calorie diet.  These included: Stopping all liquid calories and patient was given a list of fluid choices that would be appropriate.  We talked about carbohydrates.  Patient was educated that all carbohydrates turn to sugar and was encouraged to stick to the complex  carbohydrates while in weight loss trials, but aim to keep less than 100 grams during weight loss trials.      Patient was given a list of foods to not eat and drink due to high sugar, high fat, or high carbohydrate content.  Patient was also given a list of foods and drinks that are high protein, low carbohydrate, and would be appropriate to eat.  Patient was given a list of fruit and what a portion size is and how many carbohydrates it contains.  Patient was encouraged to keep fruit intake to a minimum.  Patient was also given a list of 50 low carbohydrate snack options, but was also cautioned that some of the choices still were high in calories and portion control should be practiced.       Patient's current diet habits include: 3 meals per day.  Patient is eating eggs for breakfast.  Lunch is chicken salad.  Dinner is mixed vegetables and baked chicken.  She is snacking on almonds.  She is drinking 32 ounces of water and 8 ounces of unsweetened tea.  Physical Activity/Exercise    Comments:     Currently for exercise, patient is moving legs and and arms back and forth for 5 minutes every hour.  We talked about activities for patient to do, including walking, swimming, or chair exercises.    Behavior Modification       Comments:   Patient was encouraged to food journal. I talked with patient about tracking their daily carbohydrate.  One helpful app is My Fitness Pal.  Patient was also encouraged to track their daily fluid intake.  Discussed with patient the app, Water Logged, that can be helpful in tracking their fluid intake.  We also talked about the importance of planning ahead.  Lack of willpower is often a lack of planning.     Her goal for next month is to cut all bread by October.      Adela PortsJacqueline Browning, MS RD  01/27/2018

## 2018-01-27 NOTE — Progress Notes (Signed)
 [facility]  [address] [facility], [address]    Patient's Name: Cheryl Paul   Age: 67 y.o.  Date of Birth: 1950/12/24   Sex: female    Date:   01/27/2018    Insurance:            Session: 6 of  6  Revision:   Surgeon:  Dr. Myra    Height: 5 f 3 Weight:    273      Lbs.   BMI:    Pounds Lost since last month: 0               Pounds Gained since last month: 0    Starting Weight: 279   Previous Month's Weight: 279  Overall Pounds Lost: 6 Overall Pounds Gained: 0      Do you smoke?  None    Alcohol intake:  Number of drinks at a time:  None  Number of times a week: None    Class Guidelines    Guidelines are reviewed with patient at the start of every class.    1. Patient understands that weight loss trial classes must be consecutive.  Patient understands if they miss a class, it is their responsibility to contact me to reschedule class.  I will reach out to patient after their first no show.  2.  Patient understands the expectations that weight maintenance/weight loss is expected during the classes.  Failure to demonstrate changes may result in one extra month of weight loss trial, followed by going back to see the surgeon.    3. Patient is also instructed to be doing their labs, blood work, psych visit, support group and any other test that the surgeon has used while they are working on their weight loss trial.    Other Pertinent Information:     Changes Made Since Last Class: Not to gain any weight.    Eating Habits and Behaviors      Today we reviewed key diet principles.  We talked about ways that patient can follow a low calorie diet.  These included: Stopping all liquid calories and patient was given a list of fluid choices that would be appropriate.  We talked about carbohydrates.  Patient was educated that all carbohydrates turn to sugar and was encouraged to stick to the complex carbohydrates while in weight loss trials, but aim to keep less  than 100 grams during weight loss trials.      Patient was given a list of foods to not eat and drink due to high sugar, high fat, or high carbohydrate content.  Patient was also given a list of foods and drinks that are high protein, low carbohydrate, and would be appropriate to eat.  Patient was given a list of fruit and what a portion size is and how many carbohydrates it contains.  Patient was encouraged to keep fruit intake to a minimum.  Patient was also given a list of 50 low carbohydrate snack options, but was also cautioned that some of the choices still were high in calories and portion control should be practiced.       Patient's current diet habits include: 3 meals per day.  Patient is eating eggs for breakfast.  Lunch is chicken salad.  Dinner is mixed vegetables and baked chicken.  She is snacking on almonds.  She is drinking 32 ounces of water and 8 ounces of unsweetened tea.  Physical Activity/Exercise    Comments:     Currently for exercise, patient is moving legs and and arms back and forth for 5 minutes every hour.  We talked about activities for patient to do, including walking, swimming, or chair exercises.    Behavior Modification       Comments:   Patient was encouraged to food journal. I talked with patient about tracking their daily carbohydrate.  One helpful app is My Fitness Pal.  Patient was also encouraged to track their daily fluid intake.  Discussed with patient the app, Water Logged, that can be helpful in tracking their fluid intake.  We also talked about the importance of planning ahead.  Lack of willpower is often a lack of planning.     Her goal for next month is to cut all bread by October.      Arlyne Schlossman, MS RD  01/27/2018

## 2018-01-27 NOTE — Progress Notes (Signed)
Nutrition Evaluation    Patient's Name: Cheryl MawBrenda T Blouch   Age: 67 y.o.  Date of Birth: 02-27-1951   Sex: female    Height: 5 f 3 Weight: 273 BMI:  48.5  Starting Weight:  279    Smoking Status:  None  Alcohol Intake:  Number of Drinks at a Time: None  Number of Times a Week: None    Changes made during classes include:  Eating less  Eating 3 meals per day  Don't snack at all        Two things that patient learned during this weight loss trial:  Stay away from white bread and white flour  I have the power to do it.    Summary:  I feel that Cheryl Paul has demonstrated appropriate diet changes and is ready to move forward with surgery.  Patient has been briefed on the importance of the protein drinks, vitamins, and the transition of the diet stages. Patient understands that the long-term diet will focus on protein and vegetables.  Patient understand the effects of carbohydrates after surgery and what reactive hypoglycemia is.      Patient is aware that they will be attending pre-op class 2 weeks before surgery and will get more detailed information on the post-op diet guidelines.    Patient will see me again at 6 weeks post-op. At this 6 week visit, RD will assess how patient is tolerating soft protein and advance to vegetables, if tolerating soft protein without difficulty.  Patient will also see RD again at 9 months post-op.  This visit will assess patient's compliance with current protocol, including diet, vitamins, protein shakes, and exercise.  Post-op diet guidelines will be reinforced.  RD is available for questions and to meet with patient outside of the 6 week and 9 month post-op visit.     We spent a lot of time talking about the vitamins.  Patient understands the importance of being compliant with the diet protocol and the complications and risks that can occur if they are non-compliant with the nutritional protocol.     Patient has attended at least one support group.    Candidate for surgery:  Yes  Re-evaluation Date:     Procedure:  Gastric Bypass       Adela PortsJacqueline Browning, MS RD  01/27/2018

## 2018-01-28 ENCOUNTER — Encounter: Primary: Family Medicine

## 2018-02-08 ENCOUNTER — Ambulatory Visit: Attending: Surgery | Primary: Family Medicine

## 2018-02-08 ENCOUNTER — Ambulatory Visit: Admit: 2018-02-08 | Payer: MEDICARE | Attending: Surgery | Primary: Family Medicine

## 2018-02-08 NOTE — Progress Notes (Signed)
Preop History and Physical Exam:    Cheryl Paul is a 67 y.o. black female initially evaluated in this office August 20, 2017 for discussion of surgical options available for definitive management of her clinically severe obesity.  The patient at that time weight 279 pounds on a 5 foot range frame with a body mass index of 49 and obesity related comorbidities of hypertension, adult-onset diabetes mellitus, hypertriglyceridemia, hypercholesterolemia, reactive airway disease, stricture incontinence, CPAP dependent obstructive sleep apnea, and weight related arthropathy.  Patient after discussion of surgical options available for definitive weight loss has elected to pursue laparoscopic potentially open Roux-en-Y gastric bypass to achieve definitive durable weight loss on a personal level.  The patient presents today in follow-up after completing all multidisciplinary programmatic requirements prior to the pursuit of surgery for review of the diagnostic evaluation and surgical scheduling noting no new medical or surgical history.  Patient currently weighs 273 pounds on a 5 foot 3 inch frame with a body mass index of 48, ideal body weight 128, excess body weight 145, estimated postsurgical weight loss based on 8% loss of excess body weight 116, postsurgical goal weight 157    Past Medical History:   Diagnosis Date   ??? Arthritis     knee, arms,    ??? Asthma    ??? Chronic obstructive pulmonary disease ([place])    ??? Chronic pain    ??? Diabetes ([place])    ??? H/O sinusitis    ??? Hypercholesterolemia    ??? Hypertension    ??? Toe fracture, left August 2015    4th toe       Past Surgical History:   Procedure Laterality Date   ??? ABDOMEN SURGERY PROC UNLISTED      4 surguries for blockages   ??? HX CARPAL TUNNEL RELEASE Bilateral    ??? HX CHOLECYSTECTOMY     ??? HX GYN     ??? HX HERNIA REPAIR      abdomen   ??? HX HYSTERECTOMY      tubes tied   ??? HX ORTHOPAEDIC      catrpal tunnel right and left   ??? HX POLYPECTOMY  04/18/14        Current Outpatient Medications   Medication Sig Dispense Refill   ??? HYDROcodone-acetaminophen (NORCO) 7.5-325 mg per tablet hydrocodone 7.5 mg-acetaminophen 325 mg tablet     ??? albuterol (PROVENTIL HFA, VENTOLIN HFA, PROAIR HFA) 90 mcg/actuation inhaler Take 1-2 Puffs by inhalation every four (4) hours as needed for Wheezing. 1 Inhaler 0   ??? insulin glargine (TOUJEO SOLOSTAR U-300 INSULIN) 300 unit/mL (1.5 mL) inpn 40 Units by SubCUTAneous route daily.     ??? ergocalciferol (ERGOCALCIFEROL) 50,000 unit capsule Take 1 Cap by mouth every seven (7) days. 8 Cap 0   ??? glucose blood VI test strips (ONETOUCH VERIO) strip Check blood sugar daily. DX E11.9 100 Strip 3   ??? atorvastatin (LIPITOR) 20 mg tablet TAKE ONE TABLET BY MOUTH ONE TIME DAILY 90 Tab 2   ??? lisinopril (PRINIVIL, ZESTRIL) 10 mg tablet TAKE 1 TABLET BY MOUTH DAILY 90 Tab 1   ??? nystatin (MYCOSTATIN) topical cream Apply  to affected area two (2) times a day. 15 g 1   ??? glipiZIDE (GLUCOTROL) 5 mg tablet TAKE ONE TABLET BY MOUTH TWICE DAILY 180 Tab 1   ??? lancets (ONE TOUCH DELICA) 33 gauge misc Use daily to test blood sugar    E11.9 100 Lancet 3   ??? metFORMIN (GLUCOPHAGE)  1,000 mg tablet Take 1 Tab by mouth two (2) times daily (with meals). 180 Tab 1   ??? Lancing Device with Lancets (ONE TOUCH DELICA) kit Test blood sugar daily DX E11.9 1 Kit 0   ??? alcohol swabs padm Use daily to test blood sugar  DX E11.9 100 Pad 1   ??? glucose blood VI test strips (ADVOCATE REDI-CODE+) strip Check blood sugar daily before breakfast   DX E11.9 50 Strip 3   ??? ULTRA THIN LANCETS 31 gauge misc          Allergies   Allergen Reactions   ??? Penicillins Itching   ??? Butrans [Buprenorphine] Rash   ??? Codeine Other (comments)     Nausea and abd pain   ??? Iodine Rash   ??? Lactose Diarrhea     Lactose intolerance       Social History     Tobacco Use   ??? Smoking status: Never Smoker   ??? Smokeless tobacco: Never Used   Substance Use Topics   ??? Alcohol use: No     Alcohol/week: 0.0 standard drinks    ??? Drug use: No       Family History   Problem Relation Age of Onset   ??? Hypertension Mother    ??? Stroke Mother    ??? Diabetes Mother    ??? Thyroid Disease Mother    ??? Arthritis-rheumatoid Mother    ??? Cancer Father         colon polpe   ??? Cancer Maternal Aunt        Review of Systems:  Positive in BOLD    CONST: Fever, weight loss, fatigue or chills  GI: Nausea, vomiting, abdominal pain, change in bowel habits, hematochezia, melena, and GERD   INTEG: Dermatitis, abnormal moles  HEENT: Recent changes in vision, vertigo, epistaxis, dysphagia and hoarseness  CV: Chest pain, palpitations, HTN, edema and varicosities  RESP: Cough, shortness of breath, wheezing, hemoptysis, snoring and reactive airway disease  GU: Hematuria, dysuria, frequency, urgency, nocturia and stress urinary incontinence   MS: Weakness, joint pain and arthritis  ENDO: Diabetes, thyroid disease, polyuria, polydipsia, polyphagia, poor wound healing, heat intolerance, cold intolerance  LYMPH/HEME: Anemia, bruising and history of blood transfusions  NEURO: Dizziness, headache, fainting, seizures and stroke  PSYCH: Anxiety and depression    Physical Exam    Visit Vitals  BP 134/70 (BP 1 Location: Right arm, BP Patient Position: Sitting)   Pulse 81   Temp 97.7 ??F (36.5 ??C) (Oral)   Ht 5' 3"  (1.6 m)   Wt 123.8 kg (273 lb)   SpO2 95%   BMI 48.36 kg/m??         General: Well developed, well nourished 67 y.o.) female in no acute distress  Head: Normocephalic, atraumatic  Mouth: Clear, no overt lesions, oral mucosa pink and moist  Neck: Supple, no masses, no adenopathy or carotid bruits, trachea midline  Resp: Clear to auscultation bilaterally, now wheeze, rhonchi, or rales, excursions normal and symmetrical  Cardio: Regular rate and rhythm, no murmurs, clicks, gallops, or rubs. No edema or varicosities.  Abdomen: Obese, soft, nontender, nondistended, normoactive bowel sounds, no hernias, no hepatosplenomegaly,    Extremities: Warm, well perfused, no tenderness or swelling, normal gait/station  Neuro: Sensation and strength grossly intact and symmetrical  Psych: Alert and oriented to person, place, and time.    December 01, 2017 CBC, CMP, cholesterol panel, TSH, vitamin B1, vitamin B12, folate, iron, vitamin D, hemoglobin A1c, H. pylori  serology: Vitamin D 23.4 for which supplementation was initiated, hemoglobin A1c 9.8, triglycerides 268, close to 154 with remainder laboratory values within normal parameters.  Pap smear???status post hysterectomy proximal 1980s  November 17, 2017 chest x-ray: No active disease.  March 11, 2017 mammography: No evidence of malignancy, BI-RADS 2  January 27, 2018 nutrition consultation: Concurring with pursuit of surgery.  December 21, 2017 psychology consultation: Concurring with pursuit of surgery.  Oct 08, 2017 oncology consultation/stress test???low risk for surgery with a nuclear stress study which was negative documenting an ejection fraction 67%.  December 01, 2017 EKG: Normal sinus rhythm with rate of 78 and nonspecific ST T wave changes    Colonoscopy report still attempting to be obtained  Impression:    KENLY HENCKEL is a 67 y.o. female who is suffering from morbid obesity and comorbidities including hypertension, adult-onset diabetes mellitus, hypercholesterolemia, hypertriglyceridemia, reactive airway disease, stricture incontinence, CPAP dependent obstructive sleep apnea, and weight related arthropathy who would benefit from bariatric surgery.  We've discussed the restrictive and malabsorptive nature of the gastric bypass and compared and contrasted with the sleeve gastrectomy.  The patient understands the likelihood of losing approximately 80% of their excess weight in 12 to 18 months.  also understands the risks including but not limited to bleeding, infection, need for reoperation, anastomotic ulcers, leaks and strictures, bowel obstruction secondary to adhesions and  internal hernias, DVT, PE, heart attack, stroke, and death. Patient also understands risks of inadequate weight loss, excess weight loss, vitamin insufficiency, protein malnutrition, excess skin, and loss of hair.  We have reviewed the components of a successful postoperative course including requirement for a high protein, low carbohydrate diet, 60 oz a day of zero calorie liquids, daily vitamin supplementation, daily exercise, regular follow-up, and participation in support groups.  We have reviewed the preoperative liver shrinking clear liquid diet, as well as reviewed any medication changes required while on the clear liquid diet.  In addition, the patient understands that all medications to be taken during the first 8 weeks postoperatively can be taken whole as long as the medication is approximately the size of a Bayer 325 mg aspirin tablet in size.  The patient further understands that it is his/her responsibility to review these and verify with their primary care doctor and pharmacist that all medications are of the appropriate size.  We will schedule the patient for laparoscopic potentially open Roux-en-Y gastric bypass in the near future.

## 2018-02-08 NOTE — H&P (View-Only) (Signed)
Preop History and Physical Exam:    Cheryl Paul is a 67 y.o. black female initially evaluated in this office August 20, 2017 for discussion of surgical options available for definitive management of her clinically severe obesity.  The patient at that time weight 279 pounds on a 5 foot range frame with a body mass index of 49 and obesity related comorbidities of hypertension, adult-onset diabetes mellitus, hypertriglyceridemia, hypercholesterolemia, reactive airway disease, stricture incontinence, CPAP dependent obstructive sleep apnea, and weight related arthropathy.  Patient after discussion of surgical options available for definitive weight loss has elected to pursue laparoscopic potentially open Roux-en-Y gastric bypass to achieve definitive durable weight loss on a personal level.  The patient presents today in follow-up after completing all multidisciplinary programmatic requirements prior to the pursuit of surgery for review of the diagnostic evaluation and surgical scheduling noting no new medical or surgical history.  Patient currently weighs 273 pounds on a 5 foot 3 inch frame with a body mass index of 48, ideal body weight 128, excess body weight 145, estimated postsurgical weight loss based on 8% loss of excess body weight 116, postsurgical goal weight 157    Past Medical History:   Diagnosis Date   ??? Arthritis     knee, arms,    ??? Asthma    ??? Chronic obstructive pulmonary disease ([place])    ??? Chronic pain    ??? Diabetes ([place])    ??? H/O sinusitis    ??? Hypercholesterolemia    ??? Hypertension    ??? Toe fracture, left August 2015    4th toe       Past Surgical History:   Procedure Laterality Date   ??? ABDOMEN SURGERY PROC UNLISTED      4 surguries for blockages   ??? HX CARPAL TUNNEL RELEASE Bilateral    ??? HX CHOLECYSTECTOMY     ??? HX GYN     ??? HX HERNIA REPAIR      abdomen   ??? HX HYSTERECTOMY      tubes tied   ??? HX ORTHOPAEDIC      catrpal tunnel right and left   ??? HX POLYPECTOMY  04/18/14        Current Outpatient Medications   Medication Sig Dispense Refill   ??? HYDROcodone-acetaminophen (NORCO) 7.5-325 mg per tablet hydrocodone 7.5 mg-acetaminophen 325 mg tablet     ??? albuterol (PROVENTIL HFA, VENTOLIN HFA, PROAIR HFA) 90 mcg/actuation inhaler Take 1-2 Puffs by inhalation every four (4) hours as needed for Wheezing. 1 Inhaler 0   ??? insulin glargine (TOUJEO SOLOSTAR U-300 INSULIN) 300 unit/mL (1.5 mL) inpn 40 Units by SubCUTAneous route daily.     ??? ergocalciferol (ERGOCALCIFEROL) 50,000 unit capsule Take 1 Cap by mouth every seven (7) days. 8 Cap 0   ??? glucose blood VI test strips (ONETOUCH VERIO) strip Check blood sugar daily. DX E11.9 100 Strip 3   ??? atorvastatin (LIPITOR) 20 mg tablet TAKE ONE TABLET BY MOUTH ONE TIME DAILY 90 Tab 2   ??? lisinopril (PRINIVIL, ZESTRIL) 10 mg tablet TAKE 1 TABLET BY MOUTH DAILY 90 Tab 1   ??? nystatin (MYCOSTATIN) topical cream Apply  to affected area two (2) times a day. 15 g 1   ??? glipiZIDE (GLUCOTROL) 5 mg tablet TAKE ONE TABLET BY MOUTH TWICE DAILY 180 Tab 1   ??? lancets (ONE TOUCH DELICA) 33 gauge misc Use daily to test blood sugar    E11.9 100 Lancet 3   ??? metFORMIN (GLUCOPHAGE)  1,000 mg tablet Take 1 Tab by mouth two (2) times daily (with meals). 180 Tab 1   ??? Lancing Device with Lancets (ONE TOUCH DELICA) kit Test blood sugar daily DX E11.9 1 Kit 0   ??? alcohol swabs padm Use daily to test blood sugar  DX E11.9 100 Pad 1   ??? glucose blood VI test strips (ADVOCATE REDI-CODE+) strip Check blood sugar daily before breakfast   DX E11.9 50 Strip 3   ??? ULTRA THIN LANCETS 31 gauge misc          Allergies   Allergen Reactions   ??? Penicillins Itching   ??? Butrans [Buprenorphine] Rash   ??? Codeine Other (comments)     Nausea and abd pain   ??? Iodine Rash   ??? Lactose Diarrhea     Lactose intolerance       Social History     Tobacco Use   ??? Smoking status: Never Smoker   ??? Smokeless tobacco: Never Used   Substance Use Topics   ??? Alcohol use: No     Alcohol/week: 0.0 standard drinks    ??? Drug use: No       Family History   Problem Relation Age of Onset   ??? Hypertension Mother    ??? Stroke Mother    ??? Diabetes Mother    ??? Thyroid Disease Mother    ??? Arthritis-rheumatoid Mother    ??? Cancer Father         colon polpe   ??? Cancer Maternal Aunt        Review of Systems:  Positive in BOLD    CONST: Fever, weight loss, fatigue or chills  GI: Nausea, vomiting, abdominal pain, change in bowel habits, hematochezia, melena, and GERD   INTEG: Dermatitis, abnormal moles  HEENT: Recent changes in vision, vertigo, epistaxis, dysphagia and hoarseness  CV: Chest pain, palpitations, HTN, edema and varicosities  RESP: Cough, shortness of breath, wheezing, hemoptysis, snoring and reactive airway disease  GU: Hematuria, dysuria, frequency, urgency, nocturia and stress urinary incontinence   MS: Weakness, joint pain and arthritis  ENDO: Diabetes, thyroid disease, polyuria, polydipsia, polyphagia, poor wound healing, heat intolerance, cold intolerance  LYMPH/HEME: Anemia, bruising and history of blood transfusions  NEURO: Dizziness, headache, fainting, seizures and stroke  PSYCH: Anxiety and depression    Physical Exam    Visit Vitals  BP 134/70 (BP 1 Location: Right arm, BP Patient Position: Sitting)   Pulse 81   Temp 97.7 ??F (36.5 ??C) (Oral)   Ht 5' 3"  (1.6 m)   Wt 123.8 kg (273 lb)   SpO2 95%   BMI 48.36 kg/m??         General: Well developed, well nourished 67 y.o.) female in no acute distress  Head: Normocephalic, atraumatic  Mouth: Clear, no overt lesions, oral mucosa pink and moist  Neck: Supple, no masses, no adenopathy or carotid bruits, trachea midline  Resp: Clear to auscultation bilaterally, now wheeze, rhonchi, or rales, excursions normal and symmetrical  Cardio: Regular rate and rhythm, no murmurs, clicks, gallops, or rubs. No edema or varicosities.  Abdomen: Obese, soft, nontender, nondistended, normoactive bowel sounds, no hernias, no hepatosplenomegaly,    Extremities: Warm, well perfused, no tenderness or swelling, normal gait/station  Neuro: Sensation and strength grossly intact and symmetrical  Psych: Alert and oriented to person, place, and time.    December 01, 2017 CBC, CMP, cholesterol panel, TSH, vitamin B1, vitamin B12, folate, iron, vitamin D, hemoglobin A1c, H. pylori  serology: Vitamin D 23.4 for which supplementation was initiated, hemoglobin A1c 9.8, triglycerides 268, close to 154 with remainder laboratory values within normal parameters.  Pap smear???status post hysterectomy proximal 1980s  November 17, 2017 chest x-ray: No active disease.  March 11, 2017 mammography: No evidence of malignancy, BI-RADS 2  January 27, 2018 nutrition consultation: Concurring with pursuit of surgery.  December 21, 2017 psychology consultation: Concurring with pursuit of surgery.  Oct 08, 2017 oncology consultation/stress test???low risk for surgery with a nuclear stress study which was negative documenting an ejection fraction 67%.  December 01, 2017 EKG: Normal sinus rhythm with rate of 78 and nonspecific ST T wave changes    Colonoscopy report still attempting to be obtained  Impression:    Cheryl Paul is a 67 y.o. female who is suffering from morbid obesity and comorbidities including hypertension, adult-onset diabetes mellitus, hypercholesterolemia, hypertriglyceridemia, reactive airway disease, stricture incontinence, CPAP dependent obstructive sleep apnea, and weight related arthropathy who would benefit from bariatric surgery.  We've discussed the restrictive and malabsorptive nature of the gastric bypass and compared and contrasted with the sleeve gastrectomy.  The patient understands the likelihood of losing approximately 80% of their excess weight in 12 to 18 months.  also understands the risks including but not limited to bleeding, infection, need for reoperation, anastomotic ulcers, leaks and strictures, bowel obstruction secondary to adhesions and  internal hernias, DVT, PE, heart attack, stroke, and death. Patient also understands risks of inadequate weight loss, excess weight loss, vitamin insufficiency, protein malnutrition, excess skin, and loss of hair.  We have reviewed the components of a successful postoperative course including requirement for a high protein, low carbohydrate diet, 60 oz a day of zero calorie liquids, daily vitamin supplementation, daily exercise, regular follow-up, and participation in support groups.  We have reviewed the preoperative liver shrinking clear liquid diet, as well as reviewed any medication changes required while on the clear liquid diet.  In addition, the patient understands that all medications to be taken during the first 8 weeks postoperatively can be taken whole as long as the medication is approximately the size of a Bayer 325 mg aspirin tablet in size.  The patient further understands that it is his/her responsibility to review these and verify with their primary care doctor and pharmacist that all medications are of the appropriate size.  We will schedule the patient for laparoscopic potentially open Roux-en-Y gastric bypass in the near future.

## 2018-02-08 NOTE — Progress Notes (Signed)
Preop History and Physical Exam:    Cheryl Paul is a 67 y.o. black female initially evaluated in this office August 20, 2017 for discussion of surgical options available for definitive management of her clinically severe obesity.  The patient at that time weight 279 pounds on a 5 foot range frame with a body mass index of 49 and obesity related comorbidities of hypertension, adult-onset diabetes mellitus, hypertriglyceridemia, hypercholesterolemia, reactive airway disease, stricture incontinence, CPAP dependent obstructive sleep apnea, and weight related arthropathy.  Patient after discussion of surgical options available for definitive weight loss has elected to pursue laparoscopic potentially open Roux-en-Y gastric bypass to achieve definitive durable weight loss on a personal level.  The patient presents today in follow-up after completing all multidisciplinary programmatic requirements prior to the pursuit of surgery for review of the diagnostic evaluation and surgical scheduling noting no new medical or surgical history.  Patient currently weighs 273 pounds on a 5 foot 3 inch frame with a body mass index of 48, ideal body weight 128, excess body weight 145, estimated postsurgical weight loss based on 8% loss of excess body weight 116, postsurgical goal weight 157    Past Medical History:   Diagnosis Date   ??? Arthritis     knee, arms,    ??? Asthma    ??? Chronic obstructive pulmonary disease ([place])    ??? Chronic pain    ??? Diabetes ([place])    ??? H/O sinusitis    ??? Hypercholesterolemia    ??? Hypertension    ??? Toe fracture, left August 2015    4th toe       Past Surgical History:   Procedure Laterality Date   ??? ABDOMEN SURGERY PROC UNLISTED      4 surguries for blockages   ??? HX CARPAL TUNNEL RELEASE Bilateral    ??? HX CHOLECYSTECTOMY     ??? HX GYN     ??? HX HERNIA REPAIR      abdomen   ??? HX HYSTERECTOMY      tubes tied   ??? HX ORTHOPAEDIC      catrpal tunnel right and left   ??? HX POLYPECTOMY  04/18/14       Current Outpatient  Medications   Medication Sig Dispense Refill   ??? HYDROcodone-acetaminophen (NORCO) 7.5-325 mg per tablet hydrocodone 7.5 mg-acetaminophen 325 mg tablet     ??? albuterol (PROVENTIL HFA, VENTOLIN HFA, PROAIR HFA) 90 mcg/actuation inhaler Take 1-2 Puffs by inhalation every four (4) hours as needed for Wheezing. 1 Inhaler 0   ??? insulin glargine (TOUJEO SOLOSTAR U-300 INSULIN) 300 unit/mL (1.5 mL) inpn 40 Units by SubCUTAneous route daily.     ??? ergocalciferol (ERGOCALCIFEROL) 50,000 unit capsule Take 1 Cap by mouth every seven (7) days. 8 Cap 0   ??? glucose blood VI test strips (ONETOUCH VERIO) strip Check blood sugar daily. DX E11.9 100 Strip 3   ??? atorvastatin (LIPITOR) 20 mg tablet TAKE ONE TABLET BY MOUTH ONE TIME DAILY 90 Tab 2   ??? lisinopril (PRINIVIL, ZESTRIL) 10 mg tablet TAKE 1 TABLET BY MOUTH DAILY 90 Tab 1   ??? nystatin (MYCOSTATIN) topical cream Apply  to affected area two (2) times a day. 15 g 1   ??? glipiZIDE (GLUCOTROL) 5 mg tablet TAKE ONE TABLET BY MOUTH TWICE DAILY 180 Tab 1   ??? lancets (ONE TOUCH DELICA) 33 gauge misc Use daily to test blood sugar    E11.9 100 Lancet 3   ??? metFORMIN (GLUCOPHAGE)  1,000 mg tablet Take 1 Tab by mouth two (2) times daily (with meals). 180 Tab 1   ??? Lancing Device with Lancets (ONE TOUCH DELICA) kit Test blood sugar daily DX E11.9 1 Kit 0   ??? alcohol swabs padm Use daily to test blood sugar  DX E11.9 100 Pad 1   ??? glucose blood VI test strips (ADVOCATE REDI-CODE+) strip Check blood sugar daily before breakfast   DX E11.9 50 Strip 3   ??? ULTRA THIN LANCETS 31 gauge misc          Allergies   Allergen Reactions   ??? Penicillins Itching   ??? Butrans [Buprenorphine] Rash   ??? Codeine Other (comments)     Nausea and abd pain   ??? Iodine Rash   ??? Lactose Diarrhea     Lactose intolerance       Social History     Tobacco Use   ??? Smoking status: Never Smoker   ??? Smokeless tobacco: Never Used   Substance Use Topics   ??? Alcohol use: No     Alcohol/week: 0.0 standard drinks   ??? Drug use: No        Family History   Problem Relation Age of Onset   ??? Hypertension Mother    ??? Stroke Mother    ??? Diabetes Mother    ??? Thyroid Disease Mother    ??? Arthritis-rheumatoid Mother    ??? Cancer Father         colon polpe   ??? Cancer Maternal Aunt        Review of Systems:  Positive in BOLD    CONST: Fever, weight loss, fatigue or chills  GI: Nausea, vomiting, abdominal pain, change in bowel habits, hematochezia, melena, and GERD   INTEG: Dermatitis, abnormal moles  HEENT: Recent changes in vision, vertigo, epistaxis, dysphagia and hoarseness  CV: Chest pain, palpitations, HTN, edema and varicosities  RESP: Cough, shortness of breath, wheezing, hemoptysis, snoring and reactive airway disease  GU: Hematuria, dysuria, frequency, urgency, nocturia and stress urinary incontinence   MS: Weakness, joint pain and arthritis  ENDO: Diabetes, thyroid disease, polyuria, polydipsia, polyphagia, poor wound healing, heat intolerance, cold intolerance  LYMPH/HEME: Anemia, bruising and history of blood transfusions  NEURO: Dizziness, headache, fainting, seizures and stroke  PSYCH: Anxiety and depression    Physical Exam    Visit Vitals  BP 134/70 (BP 1 Location: Right arm, BP Patient Position: Sitting)   Pulse 81   Temp 97.7 ??F (36.5 ??C) (Oral)   Ht _0  (1.6 m)   Wt 123.8 kg (273 lb)   SpO2 95%   BMI 48.36 kg/m??         General: Well developed, well nourished 67 y.o.) female in no acute distress  Head: Normocephalic, atraumatic  Mouth: Clear, no overt lesions, oral mucosa pink and moist  Neck: Supple, no masses, no adenopathy or carotid bruits, trachea midline  Resp: Clear to auscultation bilaterally, now wheeze, rhonchi, or rales, excursions normal and symmetrical  Cardio: Regular rate and rhythm, no murmurs, clicks, gallops, or rubs. No edema or varicosities.  Abdomen: Obese, soft, nontender, nondistended, normoactive bowel sounds, no hernias, no hepatosplenomegaly,   Extremities: Warm, well perfused, no tenderness or swelling,  normal gait/station  Neuro: Sensation and strength grossly intact and symmetrical  Psych: Alert and oriented to person, place, and time.    December 01, 2017 CBC, CMP, cholesterol panel, TSH, vitamin B1, vitamin B12, folate, iron, vitamin D, hemoglobin A1c, H. pylori  serology: Vitamin D 23.4 for which supplementation was initiated, hemoglobin A1c 9.8, triglycerides 268, close to 154 with remainder laboratory values within normal parameters.  Pap smear???status post hysterectomy proximal 1980s  November 17, 2017 chest x-ray: No active disease.  March 11, 2017 mammography: No evidence of malignancy, BI-RADS 2  January 27, 2018 nutrition consultation: Concurring with pursuit of surgery.  December 21, 2017 psychology consultation: Concurring with pursuit of surgery.  Oct 08, 2017 oncology consultation/stress test???low risk for surgery with a nuclear stress study which was negative documenting an ejection fraction 67%.  December 01, 2017 EKG: Normal sinus rhythm with rate of 78 and nonspecific ST T wave changes    Colonoscopy report still attempting to be obtained  Impression:    Cheryl Paul is a 67 y.o. female who is suffering from morbid obesity and comorbidities including hypertension, adult-onset diabetes mellitus, hypercholesterolemia, hypertriglyceridemia, reactive airway disease, stricture incontinence, CPAP dependent obstructive sleep apnea, and weight related arthropathy who would benefit from bariatric surgery.  We've discussed the restrictive and malabsorptive nature of the gastric bypass and compared and contrasted with the sleeve gastrectomy.  The patient understands the likelihood of losing approximately 80% of their excess weight in 12 to 18 months.  also understands the risks including but not limited to bleeding, infection, need for reoperation, anastomotic ulcers, leaks and strictures, bowel obstruction secondary to adhesions and internal hernias, DVT, PE, heart attack, stroke, and death. Patient also understands  risks of inadequate weight loss, excess weight loss, vitamin insufficiency, protein malnutrition, excess skin, and loss of hair.  We have reviewed the components of a successful postoperative course including requirement for a high protein, low carbohydrate diet, 60 oz a day of zero calorie liquids, daily vitamin supplementation, daily exercise, regular follow-up, and participation in support groups.  We have reviewed the preoperative liver shrinking clear liquid diet, as well as reviewed any medication changes required while on the clear liquid diet.  In addition, the patient understands that all medications to be taken during the first 8 weeks postoperatively can be taken whole as long as the medication is approximately the size of a Bayer 325 mg aspirin tablet in size.  The patient further understands that it is his/her responsibility to review these and verify with their primary care doctor and pharmacist that all medications are of the appropriate size.  We will schedule the patient for laparoscopic potentially open Roux-en-Y gastric bypass in the near future.

## 2018-02-11 ENCOUNTER — Encounter: Attending: Specialist | Primary: Family Medicine

## 2018-02-11 NOTE — Progress Notes (Deleted)
Patient: Cheryl Paul                MRN: [id number]       SSN: [id number]  Date of Birth: [birth year]        AGE: 67 y.o.        SEX: female      PCP: Gwyneth Revels, MD  02/10/18    No chief complaint on file.    HISTORY:  Cheryl Paul is a 67 y.o. female who is seen for bilateral shoulder and knee pain.   She has been experiencing worsening pain for the past few years.  She states previous x-rays of her knees showed severe osteoarthritis.  She states that her knees pop like popcorn.   She was previously seen at [facility] by Dr. Noberto Retort about two years ago.  She states she had previous cortisone injections.  She has difficulty standing and sitting.  She previously saw a neurosurgereon who discussed doing an laminectomy.  She has posterior burning sensations in her knees.  She has shoulder pain radiating into her upper arms.  She has discussed bariatric surgery at [facility] but did not follow through with her surgery.     She sees Dr. Jorje Guild for pain management. She was previously seen by Dr. Synetta Shadow but is no longer able to be seen at [facility] since she switched insurances.     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  She has two great grandchildren ages 70 and 80.  She has 5 grandchildren. HGA1C 10.4 as of 04/06/16. Current weight and height are 279 lbs and 5'3".     There were no vitals filed for this visit.  There is no height or weight on file to calculate BMI.    Patient Active Problem List   Diagnosis Code    ??? 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 Type 2 diabetes mellitus without complication (HCC) Y60.6   ??? Incontinence in female R32   ??? Candidal intertrigo B37.2   ??? 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 Essential hypertension I10   ??? COPD (chronic obstructive pulmonary disease) (HCC) J44.9   ??? Hyperlipidemia E78.5   ??? Vitamin D deficiency E55.9   ??? Internal hemorrhoids K64.8   ??? Colon polyps K63.5   ??? Obesity, morbid ([place]) E66.01       REVIEW OF SYSTEMS: All Below are Negative except: See HPI     Constitutional: negative for fever, chills, and weight loss.   Cardiovascular: negative for chest pain, claudication, leg swelling, SOB, DOE   Gastrointestinal: Negative for pain, N/V/C/D, Blood in stool or urine, dysuria, hematuria, incontinence, pelvic pain.   Musculoskeletal: See HPI   Neurological: Negative for dizziness and weakness.   Negative for headaches, Visual changes, confusion, seizures   Phychiatric/Behavioral: Negative for depression, memory loss, substance  abuse.    Extremities: Negative for hair changes, rash, or skin lesion changes.   Hematologic: Negative for bleeding problems,  bruising, pallor or swollen lymph  nodes   Peripheral Vascular: No calf pain, no circulation deficits.    Social History     Socioeconomic History   ??? Marital status: SINGLE     Spouse name: Not on file   ??? Number of children: Not on file   ??? Years of education: Not on file   ??? Highest education level: Not on file   Occupational History   ??? Not on file   Social Needs   ??? Financial resource strain: Not on file   ??? Food insecurity:     Worry: Not on file     Inability: Not on file   ??? Transportation needs:     Medical: Not on file     Non-medical: Not on file   Tobacco Use   ??? Smoking status: Never Smoker   ??? Smokeless tobacco: Never Used   Substance and Sexual Activity   ??? Alcohol use: No     Alcohol/week: 0.0 standard drinks   ??? Drug use: No   ??? Sexual activity: Not Currently   Lifestyle   ??? Physical activity:      Days per week: Not on file     Minutes per session: Not on file   ??? Stress: Not on file   Relationships   ??? Social connections:     Talks on phone: Not on file     Gets together: Not on file     Attends religious service: Not on file     Active member of club or organization: Not on file     Attends meetings of clubs or organizations: Not on file     Relationship status: Not on file   ??? Intimate partner violence:     Fear of current or ex partner: Not on file     Emotionally abused: Not on file     Physically abused: Not on file     Forced sexual activity: Not on file   Other Topics Concern   ??? 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 Denies history of smoking         Allergies   Allergen Reactions   ??? Penicillins Itching   ??? Butrans [Buprenorphine] Rash   ??? Codeine Other (comments)     Nausea and abd pain   ??? Iodine Rash   ??? Lactose Diarrhea     Lactose intolerance        Current Outpatient Medications   Medication Sig   ??? HYDROcodone-acetaminophen (NORCO) 7.5-325 mg per tablet hydrocodone 7.5 mg-acetaminophen 325 mg tablet   ??? albuterol (PROVENTIL HFA, VENTOLIN HFA, PROAIR HFA) 90 mcg/actuation inhaler Take 1-2 Puffs by inhalation every four (4) hours as needed for Wheezing.   ??? insulin glargine (TOUJEO SOLOSTAR U-300 INSULIN) 300 unit/mL (1.5 mL) inpn 40 Units by SubCUTAneous route daily.   ??? ergocalciferol (ERGOCALCIFEROL) 50,000 unit capsule Take 1 Cap by mouth every seven (7) days.   ??? glucose blood VI test strips (ONETOUCH VERIO) strip Check blood sugar daily. DX E11.9   ??? atorvastatin (LIPITOR) 20 mg tablet TAKE ONE TABLET BY MOUTH ONE TIME DAILY   ??? lisinopril (PRINIVIL, ZESTRIL) 10 mg tablet TAKE 1 TABLET BY MOUTH DAILY   ??? nystatin (MYCOSTATIN) topical cream Apply  to affected area two (2) times a day.   ??? glipiZIDE (GLUCOTROL) 5 mg tablet TAKE ONE TABLET BY MOUTH TWICE DAILY    ???  lancets (ONE TOUCH DELICA) 33 gauge misc Use daily to test blood sugar    E11.9   ??? metFORMIN (GLUCOPHAGE) 1,000 mg tablet Take 1 Tab by mouth two (2) times daily (with meals).   ??? Lancing Device with Lancets (ONE TOUCH DELICA) kit Test blood sugar daily DX E11.9   ??? alcohol swabs padm Use daily to test blood sugar  DX E11.9   ??? glucose blood VI test strips (ADVOCATE REDI-CODE+) strip Check blood sugar daily before breakfast   DX E11.9   ??? ULTRA THIN LANCETS 31 gauge misc      No current facility-administered medications for this visit.         PHYSICAL EXAMINATION:  There were no vitals taken for this visit.     ORTHO EXAMINATION:  Examination Left knee Right knee   Skin Intact Intact   Range of motion 95-5 95-5   Effusion - -   Medial joint line tenderness + +   Lateral joint line tenderness - -   Popliteal tenderness - -   Osteophytes palpable +, medial  +, medial   McMurray???s - -   Patella crepitus + +   Anterior drawer - -   Lateral laxity - -   Medial laxity - -   Varus deformity + +   Valgus deformity - -   Pretibial edema 2+ 2+   Calf tenderness - -     Examination Right shoulder Left shoulder   Skin Intact Intact   Effusion - -   Biceps deformity - -   Atrophy - -   AC joint tenderness - -   Acromial tenderness + +   Biceps tenderness - -   Forward flexion/Elevation ROM 175 175   Active abduction ROM 160 160   External rotation ROM 90 90   Internal rotation ROM 70 70   Apprehension - -   Impingement - -   Drop Arm Test - -   Neurovascular Intact Intact     RADIOLOGY:   XR BIL KNEE 09/03/14 [facility]  IMPRESSION:  Three views - No fractures, no effusion, bil severe end stage medial and patellofemoral joint space narrowing, + large osteophytes present. IKDC Grade D. Varus deformity    IMPRESSION:    No diagnosis found.  PLAN:    She will be referred for bariatric surgery consultation. We discussed possible need for knee arthroplasty at some time in the future  if pain continues pending weight loss <#200. I will see her back as needed.  Consider viscosupplementation if pain continues. She will start a brief course of outpatient aquatic physical therapy.    Scribed by Criss Alvine ([place]) as dictated by Delrae Sawyers, MD

## 2018-02-15 ENCOUNTER — Encounter: Admit: 2018-02-15 | Discharge: 2018-02-16 | Payer: MEDICARE | Primary: Family Medicine

## 2018-02-15 NOTE — Progress Notes (Signed)
CLINICAL NUTRITION PRE-OPERATIVE EDUCATION    Patient's Name: Cheryl Paul   Age: 67 y.o.  Date of Birth: [birth year]   Sex: female    Education & Materials Provided:   - Armed forces operational officer Guide: MVI, B12, Calcium Citrate, Vitamin D, Vitamin B1,   and iron recommendations  - Protein Supplement Information  - Fluid Requirements/ No Straws  - No Caffeine or Carbonation   - No alcohol              - No Snacks or No Concentrated Sweets     - Exercising   - Support System at [employer] of Support Group meetings.   Support System after surgery includes: x     - Key Diet Principles            - Addressed Current Habits/Changes to Make   - Patient has been educated on the liquid diet to begin 1 week prior to surgery.         Attendance of support group: Yes    Summary:  Patient has completed 6 visits with the Registered Dietitian.   During these nutrition visits, we focused on dietary changes, behavior changes, and the importance of establishing an exercise routine.  The diet protocol that patient was prescribed emphasized on low carbohydrates (less than 100 grams per day prior to surgery and 60-80 grams of protein per day).    At today's session, patient was educated on the post-op diet protocol.  Patient understands the importance of keeping total fat and sugar less than 3 grams per serving.  Patient is aware of the transition of the diet stages and is aware that they will be on clear liquids for 7days, followed by soft protein for 5 weeks.  Patient understands the body needs ~ 60-70 grams of protein per day.  During the liquid phase, patient will need 60 grams of protein from shakes.  Once eating soft protein, patient will only need 1 shake per day.  Patient is aware of the importance of the vitamins and protein drinks.      We spent a lot of time talking about the vitamins.  Patient understands the importance of being compliant with the diet protocol and the  complications and risks that can occur if they are non-compliant with the nutritional protocol and non-compliant with the vitamins.     Patient has also been educated on the pre-op liquid diet for 1 week.   Patient understands that failure to comply in pre-op liquid diet could result in surgery being canceled.    Patient's 6 week post-op nutrition appointment has been scheduled.   At this 6 week visit, RD will assess how patient is tolerating soft protein and advance to vegetables, if tolerating soft protein without difficulty.  Patient will also see RD again at 9 months post-op.  This visit will assess patient's compliance with current protocol, including diet, vitamins, protein shakes, and exercise.  Post-op diet guidelines will be reinforced.  RD is available for questions and to meet with patient outside of the 6 week and 9 month post-op visit.    Ok to proceed.     Candidate for surgery: Yes  Re-evaluation Date:     Adela Ports, MS RD  02/15/2018

## 2018-02-15 NOTE — Progress Notes (Signed)
CLINICAL NUTRITION PRE-OPERATIVE EDUCATION    Patient's Name: Cheryl Paul   Age: 67 y.o.  Date of Birth: Sep 30, 1950   Sex: female    Education & Materials Provided:   - Armed forces operational officer Guide: MVI, B12, Calcium Citrate, Vitamin D, Vitamin B1,   and iron recommendations  - Protein Supplement Information  - Fluid Requirements/ No Straws  - No Caffeine or Carbonation   - No alcohol              - No Snacks or No Concentrated Sweets     - Exercising   - Support System at [employer] of Support Group meetings.   Support System after surgery includes: x     - Key Diet Principles            - Addressed Current Habits/Changes to Make   - Patient has been educated on the liquid diet to begin 1 week prior to surgery.         Attendance of support group: Yes    Summary:  Patient has completed 6 visits with the Registered Dietitian.   During these nutrition visits, we focused on dietary changes, behavior changes, and the importance of establishing an exercise routine.  The diet protocol that patient was prescribed emphasized on low carbohydrates (less than 100 grams per day prior to surgery and 60-80 grams of protein per day).    At today's session, patient was educated on the post-op diet protocol.  Patient understands the importance of keeping total fat and sugar less than 3 grams per serving.  Patient is aware of the transition of the diet stages and is aware that they will be on clear liquids for 7days, followed by soft protein for 5 weeks.  Patient understands the body needs ~ 60-70 grams of protein per day.  During the liquid phase, patient will need 60 grams of protein from shakes.  Once eating soft protein, patient will only need 1 shake per day.  Patient is aware of the importance of the vitamins and protein drinks.      We spent a lot of time talking about the vitamins.  Patient understands the importance of being compliant with the diet protocol and the complications and  risks that can occur if they are non-compliant with the nutritional protocol and non-compliant with the vitamins.     Patient has also been educated on the pre-op liquid diet for 1 week.   Patient understands that failure to comply in pre-op liquid diet could result in surgery being canceled.    Patient's 6 week post-op nutrition appointment has been scheduled.   At this 6 week visit, RD will assess how patient is tolerating soft protein and advance to vegetables, if tolerating soft protein without difficulty.  Patient will also see RD again at 9 months post-op.  This visit will assess patient's compliance with current protocol, including diet, vitamins, protein shakes, and exercise.  Post-op diet guidelines will be reinforced.  RD is available for questions and to meet with patient outside of the 6 week and 9 month post-op visit.    Ok to proceed.     Candidate for surgery: Yes  Re-evaluation Date:     Adela Ports, MS RD  02/15/2018

## 2018-02-18 NOTE — Other (Signed)
PAT - SURGICAL PRE-ADMISSION INSTRUCTIONS    NAME:  Cheryl Paul                                                          TODAY'S DATE:  02/18/2018    SURGERY DATE:  02/21/2018                                  SURGERY ARRIVAL TIME:   0700 TBV    1. Do NOT eat or drink anything, including candy or gum, after MIDNIGHT on 02/20/18 , unless you have specific instructions from your Surgeon or Anesthesia Provider to do so.  2. No smoking on the day of surgery.  3. No alcohol 24 hours prior to the day of surgery.  4. No recreational drugs for one week prior to the day of surgery.  5. Leave all valuables, including money/purse, at home.  6. Remove all jewelry, nail polish, makeup (including mascara); no lotions, powders, deodorant, or perfume/cologne/after shave.  7. Glasses/Contact lenses and Dentures may be worn to the hospital.  They will be removed prior to surgery.  8. Call your doctor if symptoms of a cold or illness develop within 24 ours prior to surgery.  9. AN ADULT MUST DRIVE YOU HOME AFTER OUTPATIENT SURGERY.   10. If you are having an OUTPATIENT procedure, please make arrangements for a responsible adult to be with you for 24 hours after your surgery.  11. If you are admitted to the hospital, you will be assigned to a bed after surgery is complete.  Normally a family member will not be able to see you until you are in your assigned bed.  12. Family is encouraged to accompany you to the hospital.  We ask visitors in the treatment area to be limited to ONE person at a time to ensure patient privacy.  EXCEPTIONS WILL BE MADE AS NEEDED.  13. Children under 12 are discouraged from entering the treatment area and need to be supervised by an adult when in the waiting room.    Special Instructions:    Bring inhaler., Take these medications the morning of surgery with a sip of water:  NONE, HOLD oral diabetic medication on the MORNING OF surgery., HOLD metformin/glucophage dose starting the EVENING BEFORE the day of  surgery., Follow physician instructions about insulin.  If NO instructions were given, take your usual dose of BASAL insulin the night BEFORE surgery., Complete bowel prep per MD instructions.    Patient Prep:    shower with anti-bacterial soap    These surgical instructions were reviewed with PATIENT during the PAT PHONE CALL.   Directions:  On the morning of surgery, please go to [facility].  Enter the building from the [employer] lot entrance, 1st floor (next to the Emergency Room entrance).  Take the elevator to the 2nd floor.  Sign in at the Registration Desk.    If you have any questions and/or concerns, please do not hesitate to call:  (Prior to the day of surgery)  [facility] unit:  [phone]  (Day of surgery)  [facility] unit:  [phone]

## 2018-02-18 NOTE — Interval H&P Note (Signed)
PAT - SURGICAL PRE-ADMISSION INSTRUCTIONS    NAME:  Cheryl Paul                                                          TODAY'S DATE:  02/18/2018    SURGERY DATE:  02/21/2018                                  SURGERY ARRIVAL TIME:   0700 TBV    1. Do NOT eat or drink anything, including candy or gum, after MIDNIGHT on 02/20/18 , unless you have specific instructions from your Surgeon or Anesthesia Provider to do so.  2. No smoking on the day of surgery.  3. No alcohol 24 hours prior to the day of surgery.  4. No recreational drugs for one week prior to the day of surgery.  5. Leave all valuables, including money/purse, at home.  6. Remove all jewelry, nail polish, makeup (including mascara); no lotions, powders, deodorant, or perfume/cologne/after shave.  7. Glasses/Contact lenses and Dentures may be worn to the hospital.  They will be removed prior to surgery.  8. Call your doctor if symptoms of a cold or illness develop within 24 ours prior to surgery.  9. AN ADULT MUST DRIVE YOU HOME AFTER OUTPATIENT SURGERY.   10. If you are having an OUTPATIENT procedure, please make arrangements for a responsible adult to be with you for 24 hours after your surgery.  11. If you are admitted to the hospital, you will be assigned to a bed after surgery is complete.  Normally a family member will not be able to see you until you are in your assigned bed.  12. Family is encouraged to accompany you to the hospital.  We ask visitors in the treatment area to be limited to ONE person at a time to ensure patient privacy.  EXCEPTIONS WILL BE MADE AS NEEDED.  13. Children under 12 are discouraged from entering the treatment area and need to be supervised by an adult when in the waiting room.    Special Instructions:    Bring inhaler., Take these medications the morning of surgery with a sip of water:  NONE, HOLD oral diabetic medication on the MORNING OF surgery., HOLD metformin/glucophage dose starting the EVENING BEFORE the day of  surgery., Follow physician instructions about insulin.  If NO instructions were given, take your usual dose of BASAL insulin the night BEFORE surgery., Complete bowel prep per MD instructions.    Patient Prep:    shower with anti-bacterial soap    These surgical instructions were reviewed with PATIENT during the PAT PHONE CALL.   Directions:  On the morning of surgery, please go to the [facility].  Enter the building from the [employer] lot entrance, 1st floor (next to the Emergency Room entrance).  Take the elevator to the 2nd floor.  Sign in at the Registration Desk.    If you have any questions and/or concerns, please do not hesitate to call:  (Prior to the day of surgery)  [facility] unit:  [phone]  (Day of surgery)  [facility]  [phone]

## 2018-02-21 ENCOUNTER — Inpatient Hospital Stay
Admit: 2018-02-21 | Discharge: 2018-02-23 | Disposition: A | Discharge status: Home / Self Care | Payer: MEDICARE | Attending: Surgery | Admitting: Surgery

## 2018-02-21 LAB — BASIC METABOLIC PANEL
Anion Gap: 9 mmol/L (ref 3.0–18)
BUN: 17 MG/DL (ref 7.0–18)
Bun/Cre Ratio: 26 — ABNORMAL HIGH (ref 12–20)
CO2: 25 mmol/L (ref 21–32)
Calcium: 9.4 MG/DL (ref 8.5–10.1)
Chloride: 108 mmol/L (ref 100–111)
Creatinine: 0.66 MG/DL (ref 0.6–1.3)
EGFR IF NonAfrican American: >60 mL/min/{1.73_m2} (ref >60)
GFR African American: >60 mL/min/{1.73_m2} (ref >60)
Glucose: 114 mg/dL — ABNORMAL HIGH (ref 74–99)
Potassium: 4.1 mmol/L (ref 3.5–5.5)
Sodium: 142 mmol/L (ref 136–145)

## 2018-02-21 LAB — POCT GLUCOSE
POC Glucose: 120 mg/dL — ABNORMAL HIGH (ref 70–110)
POC Glucose: 154 mg/dL — ABNORMAL HIGH (ref 70–110)

## 2018-02-21 LAB — METABOLIC PANEL, BASIC
Anion gap: 9 mmol/L (ref 3.0–18)
BUN/Creatinine ratio: 26 — ABNORMAL HIGH (ref 12–20)
BUN: 17 MG/DL (ref 7.0–18)
CO2: 25 mmol/L (ref 21–32)
Calcium: 9.4 MG/DL (ref 8.5–10.1)
Chloride: 108 mmol/L (ref 100–111)
Creatinine: 0.66 MG/DL (ref 0.6–1.3)
GFR est AA: >60 mL/min/{1.73_m2} (ref >60)
GFR est non-AA: >60 mL/min/{1.73_m2} (ref >60)
Glucose: 114 mg/dL — ABNORMAL HIGH (ref 74–99)
Potassium: 4.1 mmol/L (ref 3.5–5.5)
Sodium: 142 mmol/L (ref 136–145)

## 2018-02-21 LAB — GLUCOSE, POC
Glucose (POC): 120 mg/dL — ABNORMAL HIGH (ref 70–110)
Glucose (POC): 154 mg/dL — ABNORMAL HIGH (ref 70–110)

## 2018-02-21 MED ORDER — EPHEDRINE SULFATE 50 MG/ML INJECTION SOLUTION
50 mg/mL | INTRAMUSCULAR | Status: DC | PRN
Start: 2018-02-21 — End: 2018-02-21
  Administered 2018-02-21 (×2): via INTRAVENOUS

## 2018-02-21 MED ORDER — GLUCAGON 1 MG INJECTION
1 mg | INTRAMUSCULAR | Status: DC | PRN
Start: 2018-02-21 — End: 2018-02-21

## 2018-02-21 MED ORDER — ONDANSETRON (PF) 4 MG/2 ML INJECTION
4 mg/2 mL | Freq: Four times a day (QID) | INTRAMUSCULAR | Status: DC | PRN
Start: 2018-02-21 — End: 2018-02-23
  Administered 2018-02-22 – 2018-02-23 (×3): via INTRAVENOUS

## 2018-02-21 MED ORDER — VECURONIUM BROMIDE 10 MG IV SOLR
10 mg | INTRAVENOUS | Status: DC | PRN
Start: 2018-02-21 — End: 2018-02-21
  Administered 2018-02-21 (×3): via INTRAVENOUS

## 2018-02-21 MED ORDER — DIPHENHYDRAMINE HCL 50 MG/ML IJ SOLN
50 mg/mL | INTRAMUSCULAR | Status: DC | PRN
Start: 2018-02-21 — End: 2018-02-23

## 2018-02-21 MED ORDER — FAMOTIDINE 20 MG TAB
20 mg | Freq: Once | ORAL | Status: DC
Start: 2018-02-21 — End: 2018-02-21

## 2018-02-21 MED ORDER — PROPOFOL 10 MG/ML IV EMUL
10 mg/mL | INTRAVENOUS | Status: DC | PRN
Start: 2018-02-21 — End: 2018-02-21
  Administered 2018-02-21: 14:00:00 via INTRAVENOUS

## 2018-02-21 MED ORDER — LIDOCAINE HCL 1 % (10 MG/ML) IJ SOLN
10 mg/mL (1 %) | INTRAMUSCULAR | Status: AC
Start: 2018-02-21 — End: ?

## 2018-02-21 MED ORDER — OXYCODONE 5 MG TAB
5 mg | ORAL | Status: DC | PRN
Start: 2018-02-21 — End: 2018-02-23
  Administered 2018-02-22 – 2018-02-23 (×8): via ORAL

## 2018-02-21 MED ORDER — NEOSTIGMINE METHYLSULFATE 3 MG/3 ML (1 MG/ML) IV SYRINGE
3 mg/ mL (1 mg/mL) | INTRAVENOUS | Status: AC
Start: 2018-02-21 — End: ?

## 2018-02-21 MED ORDER — ENOXAPARIN 40 MG/0.4 ML SUB-Q SYRINGE
40 mg/0.4 mL | Freq: Once | SUBCUTANEOUS | Status: AC
Start: 2018-02-21 — End: 2018-02-21
  Administered 2018-02-21: 12:00:00 via SUBCUTANEOUS

## 2018-02-21 MED ORDER — ALBUTEROL SULFATE 0.083 % (0.83 MG/ML) SOLN FOR INHALATION
2.5 mg /3 mL (0.083 %) | RESPIRATORY_TRACT | Status: DC | PRN
Start: 2018-02-21 — End: 2018-02-23

## 2018-02-21 MED ORDER — HYOSCYAMINE 0.125 MG TAB, RAPID DISSOLVE
0.125 mg | Freq: Four times a day (QID) | ORAL | Status: DC | PRN
Start: 2018-02-21 — End: 2018-02-23
  Administered 2018-02-22: 14:00:00 via SUBLINGUAL

## 2018-02-21 MED ORDER — LIDOCAINE (PF) 20 MG/ML (2 %) IJ SOLN
20 mg/mL (2 %) | INTRAMUSCULAR | Status: DC | PRN
Start: 2018-02-21 — End: 2018-02-21
  Administered 2018-02-21: 14:00:00 via INTRAVENOUS

## 2018-02-21 MED ORDER — INSULIN LISPRO 100 UNIT/ML INJECTION
100 unit/mL | Freq: Once | SUBCUTANEOUS | Status: DC
Start: 2018-02-21 — End: 2018-02-21

## 2018-02-21 MED ORDER — FENTANYL CITRATE (PF) 50 MCG/ML IJ SOLN
50 mcg/mL | INTRAMUSCULAR | Status: DC | PRN
Start: 2018-02-21 — End: 2018-02-21

## 2018-02-21 MED ORDER — SODIUM CHLORIDE 0.9 % INJECTION
INTRAMUSCULAR | Status: AC
Start: 2018-02-21 — End: ?

## 2018-02-21 MED ORDER — HYDROMORPHONE 1 MG/ML INJECTION SOLUTION
1 mg/mL | INTRAMUSCULAR | Status: DC | PRN
Start: 2018-02-21 — End: 2018-02-23

## 2018-02-21 MED ORDER — FENTANYL CITRATE (PF) 50 MCG/ML IJ SOLN
50 mcg/mL | INTRAMUSCULAR | Status: DC | PRN
Start: 2018-02-21 — End: 2018-02-21
  Administered 2018-02-21: 14:00:00 via INTRAVENOUS

## 2018-02-21 MED ORDER — DEXTROSE 10% IN WATER (D10W) IV
10 % | INTRAVENOUS | Status: DC | PRN
Start: 2018-02-21 — End: 2018-02-21

## 2018-02-21 MED ORDER — INSULIN LISPRO 100 UNIT/ML INJECTION
100 unit/mL | Freq: Once | SUBCUTANEOUS | Status: AC
Start: 2018-02-21 — End: 2018-02-21
  Administered 2018-02-21: 18:00:00 via SUBCUTANEOUS

## 2018-02-21 MED ORDER — LACTATED RINGERS IV
INTRAVENOUS | Status: DC
Start: 2018-02-21 — End: 2018-02-23
  Administered 2018-02-21 – 2018-02-23 (×6): via INTRAVENOUS

## 2018-02-21 MED ORDER — GLYCOPYRROLATE 0.6 MG/3 ML (0.2 MG/ML) INTRAVENOUS SYRINGE
0.6 mg/3 mL (0.2 mg/mL) | INTRAVENOUS | Status: DC | PRN
Start: 2018-02-21 — End: 2018-02-21
  Administered 2018-02-21: 18:00:00 via INTRAVENOUS

## 2018-02-21 MED ORDER — DEXTROSE 10% BOLUS IV (NEONATE)
10 % | INTRAVENOUS | Status: DC | PRN
Start: 2018-02-21 — End: 2018-02-23

## 2018-02-21 MED ORDER — NALOXONE 0.4 MG/ML INJECTION
0.4 mg/mL | INTRAMUSCULAR | Status: DC | PRN
Start: 2018-02-21 — End: 2018-02-23

## 2018-02-21 MED ORDER — ACETAMINOPHEN 650 MG RECTAL SUPPOSITORY
650 mg | RECTAL | Status: AC
Start: 2018-02-21 — End: ?

## 2018-02-21 MED ORDER — LACTATED RINGERS IV
INTRAVENOUS | Status: DC
Start: 2018-02-21 — End: 2018-02-21
  Administered 2018-02-21 (×3): via INTRAVENOUS

## 2018-02-21 MED ORDER — MIDAZOLAM 1 MG/ML IJ SOLN
1 mg/mL | INTRAMUSCULAR | Status: AC
Start: 2018-02-21 — End: ?

## 2018-02-21 MED ORDER — GLUCOSE 4 GRAM CHEWABLE TAB
4 gram | ORAL | Status: DC | PRN
Start: 2018-02-21 — End: 2018-02-21

## 2018-02-21 MED ORDER — BUPIVACAINE-EPINEPHRINE (PF) 0.5 %-1:200,000 IJ SOLN
0.5 %-1:200,000 | INTRAMUSCULAR | Status: DC
Start: 2018-02-21 — End: 2018-02-21

## 2018-02-21 MED ORDER — ONDANSETRON HCL (PF) 4 MG/2 ML SYRINGE
4 mg/2 mL | INTRAMUSCULAR | Status: DC | PRN
Start: 2018-02-21 — End: 2018-02-21
  Administered 2018-02-21: 18:00:00 via INTRAVENOUS

## 2018-02-21 MED ORDER — SODIUM CHLORIDE 0.9 % INJECTION
40 mg | Freq: Every day | INTRAMUSCULAR | Status: DC
Start: 2018-02-21 — End: 2018-02-21

## 2018-02-21 MED ORDER — FENTANYL CITRATE (PF) 50 MCG/ML IJ SOLN
50 mcg/mL | INTRAMUSCULAR | Status: AC
Start: 2018-02-21 — End: ?

## 2018-02-21 MED ORDER — HYDROMORPHONE 0.5 MG/0.5 ML SYRINGE
0.5 mg/ mL | INTRAMUSCULAR | Status: AC
Start: 2018-02-21 — End: 2018-02-22

## 2018-02-21 MED ORDER — ONDANSETRON (PF) 4 MG/2 ML INJECTION
4 mg/2 mL | Freq: Once | INTRAMUSCULAR | Status: DC | PRN
Start: 2018-02-21 — End: 2018-02-21

## 2018-02-21 MED ORDER — HYDROMORPHONE (PF) 1 MG/ML IJ SOLN
1 mg/mL | INTRAMUSCULAR | Status: DC | PRN
Start: 2018-02-21 — End: 2018-02-21
  Administered 2018-02-21: 15:00:00 via INTRAVENOUS

## 2018-02-21 MED ORDER — NEOSTIGMINE METHYLSULFATE 3 MG/3 ML (1 MG/ML) IV SYRINGE
3 mg/ mL (1 mg/mL) | INTRAVENOUS | Status: DC | PRN
Start: 2018-02-21 — End: 2018-02-21
  Administered 2018-02-21: 18:00:00 via INTRAVENOUS

## 2018-02-21 MED ORDER — LACTATED RINGERS IV
INTRAVENOUS | Status: DC
Start: 2018-02-21 — End: 2018-02-21

## 2018-02-21 MED ORDER — HYDROMORPHONE 1 MG/ML INJECTION SOLUTION
1 mg/mL | INTRAMUSCULAR | Status: AC
Start: 2018-02-21 — End: ?

## 2018-02-21 MED ORDER — DIPHENHYDRAMINE HCL 50 MG/ML IJ SOLN
50 mg/mL | Freq: Once | INTRAMUSCULAR | Status: DC | PRN
Start: 2018-02-21 — End: 2018-02-21

## 2018-02-21 MED ORDER — HYDROMORPHONE 2 MG/ML INJECTION SOLUTION
2 mg/mL | INTRAMUSCULAR | Status: DC | PRN
Start: 2018-02-21 — End: 2018-02-21
  Administered 2018-02-21 (×3): via INTRAVENOUS

## 2018-02-21 MED ORDER — SODIUM CHLORIDE 0.9 % IJ SYRG
INTRAMUSCULAR | Status: DC | PRN
Start: 2018-02-21 — End: 2018-02-21

## 2018-02-21 MED ORDER — LIDOCAINE (PF) 20 MG/ML (2 %) IJ SOLN
20 mg/mL (2 %) | INTRAMUSCULAR | Status: AC
Start: 2018-02-21 — End: ?

## 2018-02-21 MED ORDER — INSULIN LISPRO 100 UNIT/ML INJECTION
100 unit/mL | Freq: Four times a day (QID) | SUBCUTANEOUS | Status: DC
Start: 2018-02-21 — End: 2018-02-23
  Administered 2018-02-22: 18:00:00 via SUBCUTANEOUS

## 2018-02-21 MED ORDER — LABETALOL 5 MG/ML IV SYRINGE
20 mg/4 mL (5 mg/mL) | INTRAVENOUS | Status: AC
Start: 2018-02-21 — End: ?

## 2018-02-21 MED ORDER — SODIUM CHLORIDE 0.9 % INJECTION
20 mg/2 mL | Freq: Once | INTRAMUSCULAR | Status: AC
Start: 2018-02-21 — End: 2018-02-21
  Administered 2018-02-21: 13:00:00 via INTRAVENOUS

## 2018-02-21 MED ORDER — WATER FOR INJECTION, STERILE IV
INTRAVENOUS | Status: DC | PRN
Start: 2018-02-21 — End: 2018-02-21
  Administered 2018-02-21: 15:00:00

## 2018-02-21 MED ORDER — ALBUTEROL SULFATE HFA 90 MCG/ACTUATION AEROSOL INHALER
90 mcg/actuation | RESPIRATORY_TRACT | Status: DC | PRN
Start: 2018-02-21 — End: 2018-02-21

## 2018-02-21 MED ORDER — CEFAZOLIN 3G IN 100 ML 0.9% NS PREMIX
3 gram/100ml | Freq: Once | INTRAVENOUS | Status: AC
Start: 2018-02-21 — End: 2018-02-21
  Administered 2018-02-21: 14:00:00 via INTRAVENOUS

## 2018-02-21 MED ORDER — PANTOPRAZOLE 40 MG IV SOLR
40 mg | Freq: Every day | INTRAVENOUS | Status: DC
Start: 2018-02-21 — End: 2018-02-22
  Administered 2018-02-22: 14:00:00 via INTRAVENOUS

## 2018-02-21 MED ORDER — BUPIVACAINE-EPINEPHRINE (PF) 0.5 %-1:200,000 IJ SOLN
0.5 %-1:200,000 | INTRAMUSCULAR | Status: DC | PRN
Start: 2018-02-21 — End: 2018-02-21
  Administered 2018-02-21: 17:00:00

## 2018-02-21 MED ORDER — ACETAMINOPHEN 650 MG RECTAL SUPPOSITORY
650 mg | Freq: Once | RECTAL | Status: AC
Start: 2018-02-21 — End: 2018-02-21
  Administered 2018-02-21: 14:00:00 via RECTAL

## 2018-02-21 MED ORDER — SODIUM CHLORIDE 0.9 % IJ SYRG
Freq: Three times a day (TID) | INTRAMUSCULAR | Status: DC
Start: 2018-02-21 — End: 2018-02-21

## 2018-02-21 MED ORDER — ENOXAPARIN 40 MG/0.4 ML SUB-Q SYRINGE
40 mg/0.4 mL | Freq: Every day | SUBCUTANEOUS | Status: DC
Start: 2018-02-21 — End: 2018-02-23
  Administered 2018-02-22 – 2018-02-23 (×2): via SUBCUTANEOUS

## 2018-02-21 MED ORDER — GLUCAGON 1 MG INJECTION
1 mg | INTRAMUSCULAR | Status: DC | PRN
Start: 2018-02-21 — End: 2018-02-23

## 2018-02-21 MED ORDER — PROPOFOL 10 MG/ML IV EMUL
10 mg/mL | INTRAVENOUS | Status: AC
Start: 2018-02-21 — End: ?

## 2018-02-21 MED ORDER — VECURONIUM BROMIDE 10 MG IV SOLR
10 mg | INTRAVENOUS | Status: AC
Start: 2018-02-21 — End: ?

## 2018-02-21 MED ORDER — ACETAMINOPHEN 325 MG TABLET
325 mg | Freq: Four times a day (QID) | ORAL | Status: DC
Start: 2018-02-21 — End: 2018-02-23
  Administered 2018-02-21 – 2018-02-23 (×8): via ORAL

## 2018-02-21 MED ORDER — LABETALOL 5 MG/ML IV SYRINGE
20 mg/4 mL (5 mg/mL) | INTRAVENOUS | Status: DC | PRN
Start: 2018-02-21 — End: 2018-02-21
  Administered 2018-02-21 (×2): via INTRAVENOUS

## 2018-02-21 MED ORDER — SUCCINYLCHOLINE CHLORIDE 100 MG/5 ML (20 MG/ML) IV SYRINGE
100 mg/5 mL (20 mg/mL) | INTRAVENOUS | Status: DC | PRN
Start: 2018-02-21 — End: 2018-02-21
  Administered 2018-02-21: 14:00:00 via INTRAVENOUS

## 2018-02-21 MED FILL — ACETAMINOPHEN 325 MG TABLET: 325 mg | ORAL | Qty: 2

## 2018-02-21 MED FILL — BD POSIFLUSH NORMAL SALINE 0.9 % INJECTION SYRINGE: INTRAMUSCULAR | Qty: 40

## 2018-02-21 MED FILL — CEFAZOLIN 3G IN 100 ML 0.9% NS PREMIX: 3 gram/100ml | INTRAVENOUS | Qty: 100

## 2018-02-21 MED FILL — FENTANYL CITRATE (PF) 50 MCG/ML IJ SOLN: 50 mcg/mL | INTRAMUSCULAR | Qty: 2

## 2018-02-21 MED FILL — LACTATED RINGERS IV: INTRAVENOUS | Qty: 1000

## 2018-02-21 MED FILL — LIDOCAINE HCL 1 % (10 MG/ML) IJ SOLN: 10 mg/mL (1 %) | INTRAMUSCULAR | Qty: 20

## 2018-02-21 MED FILL — SENSORCAINE-MPF/EPINEPHRINE 0.5 %-1:200,000 INJECTION SOLUTION: 0.5 %-1:200,000 | INTRAMUSCULAR | Qty: 30

## 2018-02-21 MED FILL — MIDAZOLAM 1 MG/ML IJ SOLN: 1 mg/mL | INTRAMUSCULAR | Qty: 2

## 2018-02-21 MED FILL — FAMOTIDINE (PF) 20 MG/2 ML IV: 20 mg/2 mL | INTRAVENOUS | Qty: 2

## 2018-02-21 MED FILL — INSULIN LISPRO 100 UNIT/ML INJECTION: 100 unit/mL | SUBCUTANEOUS | Qty: 1

## 2018-02-21 MED FILL — LABETALOL 5 MG/ML IV SYRINGE: 20 mg/4 mL (5 mg/mL) | INTRAVENOUS | Qty: 8

## 2018-02-21 MED FILL — ACEPHEN 650 MG RECTAL SUPPOSITORY: 650 mg | RECTAL | Qty: 1

## 2018-02-21 MED FILL — DEXTROSE 10% IN WATER (D10W) IV: 10 % | INTRAVENOUS | Qty: 250

## 2018-02-21 MED FILL — SODIUM CHLORIDE 0.9 % INJECTION: INTRAMUSCULAR | Qty: 10

## 2018-02-21 MED FILL — HYDROMORPHONE 1 MG/ML INJECTION SOLUTION: 1 mg/mL | INTRAMUSCULAR | Qty: 1

## 2018-02-21 MED FILL — HYDROMORPHONE 0.5 MG/0.5 ML SYRINGE: 0.5 mg/ mL | INTRAMUSCULAR | Qty: 0.5

## 2018-02-21 MED FILL — XYLOCAINE-MPF 20 MG/ML (2 %) INJECTION SOLUTION: 20 mg/mL (2 %) | INTRAMUSCULAR | Qty: 5

## 2018-02-21 MED FILL — VECURONIUM BROMIDE 10 MG IV SOLR: 10 mg | INTRAVENOUS | Qty: 10

## 2018-02-21 MED FILL — NEOSTIGMINE METHYLSULFATE 3 MG/3 ML (1 MG/ML) IV SYRINGE: 3 mg/ mL (1 mg/mL) | INTRAVENOUS | Qty: 3

## 2018-02-21 MED FILL — LOVENOX 40 MG/0.4 ML SUBCUTANEOUS SYRINGE: 40 mg/0.4 mL | SUBCUTANEOUS | Qty: 0.4

## 2018-02-21 MED FILL — PROPOFOL 10 MG/ML IV EMUL: 10 mg/mL | INTRAVENOUS | Qty: 20

## 2018-02-21 NOTE — Brief Op Note (Signed)
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 and liver biopsy  Surgeon(s) and Role:     * Tyger Oka D, DO - Primary         Surgical Assistant: None    Surgical Staff:  Circ-1: Stone, Jacklyn L, RN  Circ-Relief: Santillo, Sherry A, RN  Scrub Tech-1: Edwards, Lisa  Scrub Tech-Relief: Rubiella, Robert  Surg Asst-1: Bell, Rhayven  Surg Asst-2: Sykes, Latanya D  Event Time In Time Out   Incision Start 1031    Incision Close 1333      Anesthesia: General   Estimated Blood Loss: 200  Specimens:   ID Type Source Tests Collected by Time Destination   1 : Liver Biopsy  Preservative Liver specimen  Kayceon Oki D, DO 02/21/2018 1321 Pathology      Findings: SAA   Complications: None  Implants: * No implants in log *

## 2018-02-21 NOTE — Anesthesia Pre-Procedure Evaluation (Signed)
Relevant Problems   No relevant active problems       Anesthetic History   No history of anesthetic complications            Review of Systems / Medical History  Patient summary reviewed, nursing notes reviewed and pertinent labs reviewed    Pulmonary    COPD    Sleep apnea    Asthma        Neuro/Psych   Within defined limits           Cardiovascular    Hypertension                   GI/Hepatic/Renal  Within defined limits              Endo/Other    Diabetes    Morbid obesity and arthritis     Other Findings              Physical Exam    Airway  Mallampati: II  TM Distance: 4 - 6 cm  Neck ROM: normal range of motion   Mouth opening: Normal     Cardiovascular  Regular rate and rhythm,  S1 and S2 normal,  no murmur, click, rub, or gallop             Dental    Dentition: Poor dentition     Pulmonary  Breath sounds clear to auscultation               Abdominal  GI exam deferred       Other Findings            Anesthetic Plan    ASA: 3  Anesthesia type: general          Induction: Intravenous  Anesthetic plan and risks discussed with: Patient

## 2018-02-21 NOTE — Anesthesia Post-Procedure Evaluation (Signed)
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    general    Anesthesia Post Evaluation      Multimodal analgesia: multimodal analgesia used between 6 hours prior to anesthesia start to PACU discharge  Patient location during evaluation: bedside  Patient participation: complete - patient participated  Level of consciousness: awake and alert  Pain management: adequate  Airway patency: patent  Anesthetic complications: no  Cardiovascular status: hemodynamically stable  Respiratory status: acceptable  Hydration status: acceptable  Post anesthesia nausea and vomiting:  none      Vitals Value Taken Time   BP 169/86 02/21/2018  2:31 PM   Temp 37.2 ??C (98.9 ??F) 02/21/2018  1:46 PM   Pulse 74 02/21/2018  2:41 PM   Resp 17 02/21/2018  2:41 PM   SpO2 100 % 02/21/2018  2:41 PM   Vitals shown include unvalidated device data.

## 2018-02-21 NOTE — Interval H&P Note (Signed)
H&P Update:  Cheryl Paul was seen and examined.  History and physical has been reviewed. The patient has been examined. There have been no significant clinical changes since the completion of the originally dated History and Physical.

## 2018-02-21 NOTE — Progress Notes (Addendum)
Patient received from Pacu via bed, o2 at 2 liter  Voided x1 unmeasurable  Bedside shift report given to Lolita with sbar

## 2018-02-21 NOTE — Telephone Encounter (Signed)
Spoke with [employer] this morning in reference to patient auth for surgery Dois DavenportSandra the nurse she stated she has not approved as of yet it has been sent to the Medical Director for approval will give use a call back she stated it has only been 12 days and they have up to 14 day for a answer. Dois DavenportSandra Motos [phone] ext [phone].

## 2018-02-21 NOTE — Other (Signed)
Pre-Op Summary    Pt arrived via car with family/friend and is oriented to time, place, person and situation. Patient with unsteady gait with walker assistive devices.     Visit Vitals  BP 146/82 (BP 1 Location: Left arm, BP Patient Position: Sitting)   Pulse 72   Temp 98.4 ??F (36.9 ??C)   Resp 18   Ht 5' 3" (1.6 m)   Wt 123.8 kg (273 lb)   SpO2 100%   BMI 48.36 kg/m??       Patients belongings are located with family.    Patient's point of contact is daughter Sylvonia Langdon and their contact number is: [phone]. Patient's brother will also be in the waiting room. His name is Lorenzo and his number is [phone].. They will be in the waiting room. They are able to receive medication information. They will be admitted.

## 2018-02-21 NOTE — Other (Signed)
TRANSFER - OUT REPORT:    Verbal report given to gayle rn(name) on Cheryl Paul  being transferred to 2201(unit) for routine post - op       Report consisted of patient???s Situation, Background, Assessment and   Recommendations(SBAR).     Information from the following report(s) SBAR, OR Summary, Procedure Summary and Intake/Output was reviewed with the receiving nurse.    Lines:   Peripheral IV 02/21/18 Left Antecubital (Active)   Site Assessment Clean, dry, & intact 02/21/2018  2:14 PM   Phlebitis Assessment 0 02/21/2018  2:14 PM   Infiltration Assessment 0 02/21/2018  2:14 PM   Dressing Status Clean, dry, & intact 02/21/2018  2:14 PM   Dressing Type Transparent;Tape 02/21/2018  2:14 PM   Hub Color/Line Status Infusing;Pink 02/21/2018  2:14 PM        Opportunity for questions and clarification was provided.      Patient transported with:   Registered Nurse

## 2018-02-21 NOTE — Progress Notes (Addendum)
1920: Patient in bed awake,alert and oriented x4. No signs of distress.Bed low and locked. Call bell within reach.Will continue monitoring.  2300: Ambulating in the hallway with assistance and with walker x2 ,tolerated well,back in bed without any problems.  0221: Assisted to walk in the hallway,back in bed without any problems.  0619: In bed resting quietly,no other concern at this time,caal bell within reach.Will monitor.  Patient Vitals for the past 12 hrs:   Temp Pulse Resp BP SpO2   02/22/18 0317 99.2 ??F (37.3 ??C) 78 16 136/78 97 %   02/21/18 2349 98 ??F (36.7 ??C) 78 17 138/78 97 %   02/21/18 2130 98 ??F (36.7 ??C) 78 16 140/89 100 %   02/21/18 1954 97.6 ??F (36.4 ??C) 64 16 (!) 159/93 100 %   02/21/18 1910 97.5 ??F (36.4 ??C) 66 15 153/83 100 %

## 2018-02-21 NOTE — Interval H&P Note (Signed)
 Pre-Op Summary    Pt arrived via car with family/friend and is oriented to time, place, person and situation. Patient with unsteady gait with walker assistive devices.     Visit Vitals  BP 146/82 (BP 1 Location: Left arm, BP Patient Position: Sitting)   Pulse 72   Temp 98.4 F (36.9 C)   Resp 18   Ht 5' 3 (1.6 m)   Wt 123.8 kg (273 lb)   SpO2 100%   BMI 48.36 kg/m       Patients belongings are located with family.    Patient's point of contact is daughter Mckynzi Cammon and their contact number is: [phone]. Patient's brother will also be in the waiting room. His name is Arlon and his number is [phone].. They will be in the waiting room. They are able to receive medication information. They will be admitted.

## 2018-02-21 NOTE — Progress Notes (Signed)
Patient received from Pacu via bed, o2 at 2 liter  Voided x1 unmeasurable  Bedside shift report [facility]bar

## 2018-02-21 NOTE — Anesthesia Pre-Procedure Evaluation (Signed)
Relevant Problems   No relevant active problems       Anesthetic History   No history of anesthetic complications            Review of Systems / Medical History  Patient summary reviewed, nursing notes reviewed and pertinent labs reviewed    Pulmonary    COPD    Sleep apnea    Asthma        Neuro/Psych   Within defined limits           Cardiovascular    Hypertension                   GI/Hepatic/Renal  Within defined limits              Endo/Other    Diabetes    Morbid obesity and arthritis     Other Findings              Physical Exam    Airway  Mallampati: II  TM Distance: 4 - 6 cm  Neck ROM: normal range of motion   Mouth opening: Normal     Cardiovascular  Regular rate and rhythm,  S1 and S2 normal,  no murmur, click, rub, or gallop             Dental    Dentition: Poor dentition     Pulmonary  Breath sounds clear to auscultation               Abdominal  GI exam deferred       Other Findings            Anesthetic Plan    ASA: 3  Anesthesia type: general          Induction: Intravenous  Anesthetic plan and risks discussed with: Patient

## 2018-02-21 NOTE — Interval H&P Note (Signed)
 TRANSFER - OUT REPORT:    Verbal report given to gayle rn(name) on Cheryl Paul  being transferred to 2201(unit) for routine post - op       Report consisted of patient's Situation, Background, Assessment and   Recommendations(SBAR).     Information from the following report(s) SBAR, OR Summary, Procedure Summary and Intake/Output was reviewed with the receiving nurse.    Lines:   Peripheral IV 02/21/18 Left Antecubital (Active)   Site Assessment Clean, dry, & intact 02/21/2018  2:14 PM   Phlebitis Assessment 0 02/21/2018  2:14 PM   Infiltration Assessment 0 02/21/2018  2:14 PM   Dressing Status Clean, dry, & intact 02/21/2018  2:14 PM   Dressing Type Transparent;Tape 02/21/2018  2:14 PM   Hub Color/Line Status Infusing;Pink 02/21/2018  2:14 PM        Opportunity for questions and clarification was provided.      Patient transported with:   Registered Nurse

## 2018-02-21 NOTE — Op Note (Signed)
[facility]  OPERATIVE REPORT    Name:  Cheryl Paul, Cheryl Paul  MR#:   [id number]  DOB:  02-18-1951  ACCOUNT #:  [id number]  DATE OF SERVICE:  02/21/2018    PREOPERATIVE DIAGNOSES:  Clinically severe obesity with a body mass index of 48 and obesity-related comorbidities of hypertension, adult-onset diabetes mellitus, hypertriglyceridemia, hypercholesterolemia, reactive airways disease, stress urinary incontinence, CPAP-dependent obstructive sleep apnea, and weight-related arthropathy.    POSTOPERATIVE DIAGNOSES:  1.  Extensive intraabdominal adhesive disease.  2.  Clinically severe obesity with a body mass index of 48 and obesity-related comorbidities consisting of hypertension, adult-onset diabetes mellitus, hypertriglyceridemia, hypercholesterolemia, reactive airways disease, stress urinary incontinence, CPAP-dependent obstructive sleep apnea, and weight-related arthropathy.  3.  Hepatic steatosis secondary to metabolic syndrome.    PROCEDURES PERFORMED:  1.  Extensive laparoscopic lysis of adhesions requiring 68 minutes of operative time.  2.  Laparoscopic Roux-en-Y gastric bypass utilizing a 15 mL separate tubularized gastric pouch placed on the lesser curvature of the stomach, a 100-cm retrocolic retrogastric Roux limb, and a 40-cm biliopancreatic limb.  3.  Laparoscopic left hepatic lobe wedge biopsy.    SURGEON:  Jaclyn Prime. Karleen Hampshire, DO    ASSISTANT: None    ANESTHESIA:  General endotracheal supplemented at the conclusion of the operative procedure with local infiltration to the operative sites with equal mixture of 1% lidocaine plain mixed with equal portions of 0.5% Marcaine diluted with 1:100,000 of epinephrine, utilizing a total volume of 50 mL.    COMPLICATIONS:  None.    SPECIMENS REMOVED:  Left hepatic lobe wedge biopsy.    IMPLANTS: None.    ESTIMATED BLOOD LOSS:  200 mL.    URINE OUTPUT:  700 mL.    OPERATIVE FINDINGS:  The patient had extensive intraabdominal adhesive disease secondary to at least  six previous abdominal surgical procedures.  The adhesions consisted of omental, transverse colon, and small bowel adhesions to the anterior abdominal wall in addition to colonic and mesocolic adhesions to the small bowel and anterior mesenteric adhesions of the small bowel.    INDICATIONS FOR SURGICAL PROCEDURE:  This is a 67 year old black female, who has failed medical management of her clinically severe obesity, who after investigation of the surgical options, has elected to pursue with laparoscopic potentially open Roux-en-Y gastric bypass to achieve definitive durable weight loss on a personal level with expected resolution of obesity-related comorbidities.  The risks and benefits of operative versus nonoperative potential alternatives and potential complications to include but not limited to undesired cosmetic result; conversion to open procedure; wound hematoma, seroma, or infection; inadequate or excessive weight loss; injury to any intraabdominal or retroperitoneal structure; vitamin or mineral deficiencies; malnutrition; DVT; PE; pneumonia; myocardial infarction; stroke; and potentially death were all discussed in detail with the patient prior to surgical procedure, who voiced her understanding and wished to proceed.    DESCRIPTION OF THE PROCEDURE:  The patient received Ancef for antibiotic prophylaxis and Lovenox for DVT prophylaxis in the preoperative staging area.  She then signed her operative permit which was properly witnessed and she was transported to the operating room where she was placed in the supine position on the operating table, and SCDs were placed and inflated prior to induction of general endotracheal anesthesia.  A 34-French orogastric tube was placed for gastric decompression, as was a 16-French Foley catheter.  The abdomen was then prepped and draped in the usual sterile fashion.  A time-out procedure was performed according to protocol and agreed upon by all  personnel in the  operating suite.  Because of the patient's extensive abdominal surgical history, a 2-mm transverse incision was made in the left midclavicular line through which a Veress needle was introduced into the peritoneal cavity and utilized to insufflate the peritoneal cavity with carbon dioxide gas, never exceeding a pressure of 15 mmHg.  A transverse 5-mm cutaneous incision was then made 5 cm below the left costal margin in the anterior axial line and a 5-mm zero-degree laparoscope was utilized in conjunction with a 5-mm Optiview trocar and cannula.  It was placed into the peritoneal cavity under direct vision.  The trocar and laparoscope were removed.  The abdomen continued to be insufflated with carbon dioxide gas, never exceeding a pressure of 15 mmHg, and visualization of the anterior abdominal wall with a 30-degree 5-mm laparoscope noted the patient to have extensive adhesive disease involving omental, colonic, and small bowel adhesions to the anterior abdominal wall.  A transverse 12-mm cutaneous incision was then made in the left midclavicular line approximately 15 cm below the level of the costal margin and a 12-mm Optiview trocar and cannula were placed into the peritoneal cavity under direct vision.  Over the next 68 minutes which were required for lysis of adhesions, the omental, small bowel, and transverse colonic adhesions to the anterior abdominal wall were taken down, as were mesocolic and colonic adhesions to the small bowel and multiple interloop small bowel adhesions.  As the lysis of adhesions progressed, additional trocars were added; one was added 15 cm below the xiphisternal junction in the midline through a 12-mm transverse cutaneous incision utilizing a 12-mm Optiview trocar and cannula.  The patient, it should be noted, has no umbilicus.  Therefore, the standard mark was not able to be utilized, and an additional 12-mm transverse cutaneous incision was then made approximately 15 cm below the right  costal margin in the right midclavicular line and a 12-mm Optiview trocar and cannula were placed into the peritoneal cavity under direct vision.  The omentum and transverse colon at this point were elevated superiorly to expose the ligament of Treitz, and at a distance of 40 cm distal to the ligament of Treitz, the jejunum was transected utilizing a triple-load stapling device.  The jejunum was transected with a 60 mm stapling device which was loaded with triple load 5 mm staples.  The Harmonic scalpel was then utilized to extend the mesenteric transection to the base of the mesentery.  The Roux limb was measured to 100 cm in length.  At this point, on its antimesenteric border, an enterotomy was created with a Harmonic scalpel.  A similar antimesenteric enterotomy was created 2 cm proximal to the staple line at the biliopancreatic limb and a stapled side-to-side functional end-to-side jejunojejunostomy was constructed utilizing a triple-load stapling device 60 mm in length loaded with 2.5-mm staples, introducing one arm of the stapling device into each of the respective limbs prior to firing it on the antimesenteric borders.  The remaining enteric defect was then closed with a running 2-0 Vicryl suture.  The ligament of Treitz was then identified, and just anterior to the ligament of Treitz, a fenestration was created  through the mesocolon into the lesser sac utilizing a Harmonic scalpel, and the Roux limb was placed through this incision into lesser sac.  The omentum and transverse colon were reflected back to their normal anatomic positions.  The crus was identified along the lesser curvature of the stomach.  Just inferior to the crus, the omentum  was separated from the gastric wall utilizing the Harmonic scalpel until the Roux limb was visualized within the lesser sac.  A transverse 12-mm cutaneous incision was then made approximately 5 cm below the xiphisternal junction in the midline, through which a trocar  was placed into the peritoneal cavity under direct vision.  This was removed and replaced with a 10-mm stone grasping forceps utilized to elevate the left lobe of the liver in conjunction with a self-retaining retractor.  The peritoneum overlying the angle of His was opened with the Harmonic scalpel.  At 5 cm distal to the GE junction and along the lesser curvature of the stomach, the neurovascular elements of the lesser omentum were separated from the gastric wall utilizing a Harmonic scalpel.  Upon completion of this, lesser curve fenestration was accomplished utilizing esophageal retractor introduced posterior to the stomach through the omental fenestration.  The 34-French orogastric tube was removed.  A triple-load stapling device 60 mm in length loaded with 2.5-mm staples was introduced into the lesser curve fenestration and was oriented 5 cm distal to the GE junction, transversely to the lesser curvature prior to firing one-third the length of its staple load.  It should be noted that the 34-French orogastric tube had been retracted into esophagus prior to initiating construction of the gastric pouch.  A second 60-mm triple-load stapling device loaded with 2.5-mm staples was then introduced and used to initiate the vertical aspect of the tubularized gastric pouch placed along the lesser curvature of the stomach, firing one-third of the length of the staple load.  Utilizing the 34-French orogastric tube, it was decided to put the remainder of the gastric pouch which was constructed with sequential firings of a triple-load stapling device 60 mm in length, loaded with 2.5-mm staples ending the transection at the angle of His, it should be noted that the second firing utilized a buttressing strip this created a 15 mL separate tubularized gastric pouch placed on the lesser curvature of the stomach.  The Roux limb was then elevated in the posterior stomach in retrogastric position where a tension-free hand-sewn  tubularized gastrojejunostomy was then constructed.  The geometric orientation of the gastrojejunostomy was at the distal aspect of the tubularized gastric pouch and the antimesenteric border of the Roux limb 2 cm distal to the staple line.  On the posterior Roux limb, a running seromuscular 3-0 Vicryl suture was placed.  A transverse 12-mm gastrotomy was made at the distal aspect of the tubularized gastric pouch and adjacent antimesenteric 12 mm Roux limb enterotomy.  A running inner layer of full-thickness 3-0 Vicryl suture was then initiated at the left lateral posterior aspect of the anastomosis, running across the posterior aspect of the anastomosis around the right lateral aspect of the anastomosis to the anterior midline.  A second full-thickness 3-0 Vicryl suture was begun adjacent to the origin of first one around the left lateral aspect of the anastomosis to the anterior midline.  A 34-French orogastric tube was then advanced across the gastrojejunal anastomosis into the Roux limb prior to tying the running inner layer of full-thickness 3-0 Vicryl sutures together anteriorly.  The gastrojejunostomy was then completed anteriorly utilizing a running seromuscular 3-0 Vicryl suture.  The 34-French orogastric tube was removed and after ensuring there was adequate redundancy of the Roux limb above the level of the mesocolon and the 34-French orogastric catheter had been removed, the omentum was then reflected  superiorly and closure of the mesocolic defect was accomplished with a series of  simple interrupted 2-0 silk sutures.  The space which appears concerning about the curve was then closed with a running 2-0 silk suture.  The omentum was then returned to its normal anatomic position, and the Roux limb was noted to be viable with adequate redundancy above the level of the mesocolon.  There was no tension over the gastrojejunal anastomosis.  The self-retaining retractor and stone-grasping forceps were removed.   The patient, it should be noted, had a very enlarged liver with morphologic appearance of hepatic steatosis, and decision was made to perform a biopsy.  This was accomplished utilizing a scissors to excise a 1.5 cm wedge-shaped biopsy, which was passed off the field for specimen.  Hemostasis was then established with electrocautery.  The midline 12-mm trocar sites were then closed with a suture-passing device and a #2 Vicryl suture under direct vision utilizing standard technique.  All operative sites were inspected and noted to be hemostatic.  The abdomen was then desufflated of carbon dioxide gas.  Trocars were removed under direct vision.  The fascial sutures were then tied.  The wounds were irrigated with normal saline solution, and closure of the skin was accomplished with a series of simple interrupted buried deep dermal 4-0 Monocryl sutures.  The wounds were infiltrated with an equal mixture of 1% lidocaine plain mixed with equal portions of 0.5% Marcaine diluted with 1:100,000 epinephrine utilizing a total volume of 50 mL.  Steri-Strips were applied as were sterile dressings.  Sponge and needle counts were correct both during and at the completion of the procedure.  The patient tolerated the procedure without intraoperative complications, subsequently was extubated and transported to the recovery room awake, alert, and in stable condition.  Estimated blood loss was 200 mL.  The patient received 2 L of crystalloid during the procedure, and had an urine output of 700 mL.  The operative start time was 10:31, completion time was 13:33.      Warren Danes, DO      [id number]  D:  02/21/2018 14:02  T:  02/22/2018 12:46  JOB #:  [id number]

## 2018-02-21 NOTE — Anesthesia Post-Procedure Evaluation (Signed)
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    general    Anesthesia Post Evaluation      Multimodal analgesia: multimodal analgesia used between 6 hours prior to anesthesia start to PACU discharge  Patient location during evaluation: bedside  Patient participation: complete - patient participated  Level of consciousness: awake and alert  Pain management: adequate  Airway patency: patent  Anesthetic complications: no  Cardiovascular status: hemodynamically stable  Respiratory status: acceptable  Hydration status: acceptable  Post anesthesia nausea and vomiting:  none      Vitals Value Taken Time   BP 169/86 02/21/2018  2:31 PM   Temp 37.2 ??C (98.9 ??F) 02/21/2018  1:46 PM   Pulse 74 02/21/2018  2:41 PM   Resp 17 02/21/2018  2:41 PM   SpO2 100 % 02/21/2018  2:41 PM   Vitals shown include unvalidated device data.

## 2018-02-21 NOTE — Op Note (Signed)
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 and liver biopsy  Surgeon(s) and Role:     * Warren DanesSpencer, Graycie Halley D, DO - Primary         Surgical Assistant: None    Surgical Staff:  Circ-1: Linde GillisStone, Jacklyn L, RN  Circ-Relief: Jake MichaelisSantillo, Sherry A, RN  Scrub Tech-1: Nickola MajorEdwards, Lisa  Scrub Tech-Relief: Earney Malletubiella, Robert  Surg Asst-1: Morton PetersBell, Rhayven  Surg Asst-2: Riki RuskSykes, Latanya D  Event Time In Time Out   Incision Start 1031    Incision Close 1333      Anesthesia: General   Estimated Blood Loss: 200  Specimens:   ID Type Source Tests Collected by Time Destination   1 : Liver Biopsy  Preservative Liver specimen  Warren DanesSpencer, Evonna Stoltz D, DO 02/21/2018 1321 Pathology      Findings: SAA   Complications: None  Implants: * No implants in log *

## 2018-02-21 NOTE — Progress Notes (Signed)
 1920: Patient in bed awake,alert and oriented x4. No signs of distress.Bed low and locked. Call bell within reach.Will continue monitoring.  2300: Ambulating in the hallway with assistance and with walker x2 ,tolerated well,back in bed without any problems.  0221: Assisted to walk in the hallway,back in bed without any problems.  9380: In bed resting quietly,no other concern at this time,caal bell within reach.Will monitor.  Patient Vitals for the past 12 hrs:   Temp Pulse Resp BP SpO2   02/22/18 0317 99.2 F (37.3 C) 78 16 136/78 97 %   02/21/18 2349 98 F (36.7 C) 78 17 138/78 97 %   02/21/18 2130 98 F (36.7 C) 78 16 140/89 100 %   02/21/18 1954 97.6 F (36.4 C) 64 16 (!) 159/93 100 %   02/21/18 1910 97.5 F (36.4 C) 66 15 153/83 100 %

## 2018-02-22 LAB — CBC
Hematocrit: 38.2 % (ref 35.0–45.0)
Hemoglobin: 11.8 g/dL — ABNORMAL LOW (ref 12.0–16.0)
MCH: 26.8 PG (ref 24.0–34.0)
MCHC: 30.9 g/dL — ABNORMAL LOW (ref 31.0–37.0)
MCV: 86.8 FL (ref 74.0–97.0)
MPV: 13.1 FL — ABNORMAL HIGH (ref 9.2–11.8)
Platelets: 168 10*3/uL (ref 135–420)
RBC: 4.4 M/uL (ref 4.20–5.30)
RDW: 14.1 % (ref 11.6–14.5)
WBC: 10.4 10*3/uL (ref 4.6–13.2)

## 2018-02-22 LAB — BASIC METABOLIC PANEL
Anion Gap: 12 mmol/L (ref 3.0–18)
BUN: 12 MG/DL (ref 7.0–18)
Bun/Cre Ratio: 19 (ref 12–20)
CO2: 23 mmol/L (ref 21–32)
Calcium: 8.8 MG/DL (ref 8.5–10.1)
Chloride: 104 mmol/L (ref 100–111)
Creatinine: 0.64 MG/DL (ref 0.6–1.3)
EGFR IF NonAfrican American: >60 mL/min/{1.73_m2} (ref >60)
GFR African American: >60 mL/min/{1.73_m2} (ref >60)
Glucose: 139 mg/dL — ABNORMAL HIGH (ref 74–99)
Potassium: 4.1 mmol/L (ref 3.5–5.5)
Sodium: 139 mmol/L (ref 136–145)

## 2018-02-22 LAB — POCT GLUCOSE
POC Glucose: 148 mg/dL — ABNORMAL HIGH (ref 70–110)
POC Glucose: 154 mg/dL — ABNORMAL HIGH (ref 70–110)
POC Glucose: 135 mg/dL — ABNORMAL HIGH (ref 70–110)
POC Glucose: 153 mg/dL — ABNORMAL HIGH (ref 70–110)
POC Glucose: 132 mg/dL — ABNORMAL HIGH (ref 70–110)

## 2018-02-22 LAB — MAGNESIUM
Magnesium: 1.5 mg/dL — ABNORMAL LOW (ref 1.6–2.6)
Magnesium: 1.5 mg/dL — ABNORMAL LOW (ref 1.6–2.6)

## 2018-02-22 LAB — CBC W/O DIFF
HCT: 38.2 % (ref 35.0–45.0)
HGB: 11.8 g/dL — ABNORMAL LOW (ref 12.0–16.0)
MCH: 26.8 PG (ref 24.0–34.0)
MCHC: 30.9 g/dL — ABNORMAL LOW (ref 31.0–37.0)
MCV: 86.8 FL (ref 74.0–97.0)
MPV: 13.1 FL — ABNORMAL HIGH (ref 9.2–11.8)
PLATELET: 168 10*3/uL (ref 135–420)
RBC: 4.4 M/uL (ref 4.20–5.30)
RDW: 14.1 % (ref 11.6–14.5)
WBC: 10.4 10*3/uL (ref 4.6–13.2)

## 2018-02-22 LAB — METABOLIC PANEL, BASIC
Anion gap: 12 mmol/L (ref 3.0–18)
BUN/Creatinine ratio: 19 (ref 12–20)
BUN: 12 MG/DL (ref 7.0–18)
CO2: 23 mmol/L (ref 21–32)
Calcium: 8.8 MG/DL (ref 8.5–10.1)
Chloride: 104 mmol/L (ref 100–111)
Creatinine: 0.64 MG/DL (ref 0.6–1.3)
GFR est AA: >60 mL/min/{1.73_m2} (ref >60)
GFR est non-AA: >60 mL/min/{1.73_m2} (ref >60)
Glucose: 139 mg/dL — ABNORMAL HIGH (ref 74–99)
Potassium: 4.1 mmol/L (ref 3.5–5.5)
Sodium: 139 mmol/L (ref 136–145)

## 2018-02-22 LAB — GLUCOSE, POC
Glucose (POC): 148 mg/dL — ABNORMAL HIGH (ref 70–110)
Glucose (POC): 154 mg/dL — ABNORMAL HIGH (ref 70–110)
Glucose (POC): 135 mg/dL — ABNORMAL HIGH (ref 70–110)
Glucose (POC): 153 mg/dL — ABNORMAL HIGH (ref 70–110)
Glucose (POC): 132 mg/dL — ABNORMAL HIGH (ref 70–110)

## 2018-02-22 MED ORDER — ONDANSETRON 4 MG TAB, RAPID DISSOLVE
4 mg | ORAL_TABLET | Freq: Three times a day (TID) | ORAL | 0 refills | Status: DC | PRN
Start: 2018-02-22 — End: 2018-11-07

## 2018-02-22 MED ORDER — LISINOPRIL 40 MG TAB
40 mg | Freq: Every day | ORAL | Status: DC
Start: 2018-02-22 — End: 2018-02-23
  Administered 2018-02-22 – 2018-02-23 (×2): via ORAL

## 2018-02-22 MED ORDER — ENOXAPARIN 40 MG/0.4 ML SUB-Q SYRINGE
40 mg/0.4 mL | INJECTION | Freq: Every day | SUBCUTANEOUS | 0 refills | Status: AC
Start: 2018-02-22 — End: 2018-03-01

## 2018-02-22 MED ORDER — OXYCODONE 5 MG TAB
5 mg | ORAL_TABLET | ORAL | 0 refills | Status: AC | PRN
Start: 2018-02-22 — End: 2018-02-25

## 2018-02-22 MED ORDER — PANTOPRAZOLE 40 MG TAB, DELAYED RELEASE
40 mg | Freq: Every day | ORAL | Status: DC
Start: 2018-02-22 — End: 2018-02-23
  Administered 2018-02-23: 12:00:00 via ORAL

## 2018-02-22 MED FILL — OXYCODONE 5 MG TAB: 5 mg | ORAL | Qty: 1

## 2018-02-22 MED FILL — ONDANSETRON (PF) 4 MG/2 ML INJECTION: 4 mg/2 mL | INTRAMUSCULAR | Qty: 2

## 2018-02-22 MED FILL — LOVENOX 40 MG/0.4 ML SUBCUTANEOUS SYRINGE: 40 mg/0.4 mL | SUBCUTANEOUS | Qty: 0.4

## 2018-02-22 MED FILL — ACETAMINOPHEN 325 MG TABLET: 325 mg | ORAL | Qty: 2

## 2018-02-22 MED FILL — PROTONIX 40 MG INTRAVENOUS SOLUTION: 40 mg | INTRAVENOUS | Qty: 40

## 2018-02-22 MED FILL — HYOSCYAMINE 0.125 MG TAB, RAPID DISSOLVE: 0.125 mg | ORAL | Qty: 1

## 2018-02-22 MED FILL — INSULIN LISPRO 100 UNIT/ML INJECTION: 100 unit/mL | SUBCUTANEOUS | Qty: 1

## 2018-02-22 MED FILL — LISINOPRIL 40 MG TAB: 40 mg | ORAL | Qty: 1

## 2018-02-22 NOTE — Progress Notes (Signed)
0720: Patient care assumed at this time. Patient is in bed with family at bedside. IV site is infusing, clean dry and intact. Patient educated on IS, hydration and ambulation. 6 trocar sites on abdomen are clean dry and intact with no redness or drainage. Patient can move all extremities and has sensation present.    0800: ambulate to bathroom    1000: Ambulate to bathroom, lovenox education given.     1200: Patient ambulating in hallway. Denies complaints. Educated on IS use and hydration. Verbalized understanding     1355: Ambulating in hallway, denies complaints. Patient educated regarding hydration and plan of care.     1730: Patient ambulating in hallway. Denies complaints

## 2018-02-22 NOTE — Progress Notes (Signed)
In bed ans states feeling better. Low grade fever and she was educated on IS. Doing poorly on IS needs much encouragement her nurse is aware. Assisted oob to bathroom with assist of one and her walker. Patient was educated on progression of diet this AM. Goal of 4 ounces per hour with one ounce being protein was clearly understood. Patient was instructed to go slow with small sips to reach goal. Patient given a report card to record intake. Education completed on I.S use and to ambulate in hall at least 4 times.  0905-  When in chair she c/o nausea and pain. Spoke with her her nurse and they will give meds. I will continue to educate and encourage.

## 2018-02-22 NOTE — Other (Signed)
Bedside and Verbal shift change report given to Erna/Emily,RN (oncoming nurse) by Lolita Ayo,RN (offgoing nurse). Report included the following information SBAR, Kardex, MAR and Recent Results.

## 2018-02-22 NOTE — Progress Notes (Signed)
0800  Sitting on the chair, informed about her goal to walk, to cal;l if needed help, grandson at bedside, no nausea.    1130  BP elevated asymptomatic, was up  Walking in the hallway. Recheck while resting.    1800  Been up  Walking in the hallway, no emesis, pain pain under control.

## 2018-02-22 NOTE — Progress Notes (Addendum)
2017: Patient in bed awake,alert and oriented x 4. No signs of distress. Bed low and locked. Ambulating earlier with family. Call bell within reach.Will monitor.  0519: Assisted patient  to ambulate in the hallway,tolerated well,back in recliner,resting now. Uneventful night,no other concern at this time,call bell within reach.Will monitor.  0630: Patient in recliner,resting .Will continue monitoring.  Patient Vitals for the past 24 hrs:   Temp Pulse Resp BP SpO2   02/23/18 0748 98.5 ??F (36.9 ??C) 84 18 143/85 100 %   02/23/18 0308 99 ??F (37.2 ??C) 75 18 158/88 98 %   02/23/18 0016 99.1 ??F (37.3 ??C) 76 18 160/78 98 %   02/22/18 1924 99.3 ??F (37.4 ??C) 74 18 (!) 169/94 99 %   02/22/18 1620 98.7 ??F (37.1 ??C) 76 18 151/85 99 %   02/22/18 1425 97.7 ??F (36.5 ??C) 76 17 141/80 99 %   02/22/18 1110 98.9 ??F (37.2 ??C) 72 16 (!) 169/111 98 %

## 2018-02-22 NOTE — Other (Signed)
Glycemic Control Plan of Care    One day post op Laparoscopic Roux-en-Y gastric bypass - sitting in chair, grandson at bedside.  Denies any pain, n/v - tolerating clear liquid gastric bypass diet.  Reviewed her most recent A1c and BG over last 24 hours -     Your A1C  was   Lab Results   Component Value Date/Time    Hemoglobin A1c 9.8 (H) 12/01/2017 08:03 AM   .          Reviewed her PTA  DM medications - she was taking metformin 1,000 mg only once a day - Glipizide 5 mg twice daily.  Was prescribed Toujeo 40 units daily but states she only took this for a few months r/t episodes of hypoglycemia.     Instructed her to continue to monitor her BG twice daily and record for her f/u appointment -  Has f/u with Dr. Karleen Hampshire Oct. 23, 2019 and to see PCP in one month.  No further DM medications indicated at discharge.      Rayann Heman MPH RN  Pager [phone]  Office [phone]

## 2018-02-22 NOTE — Op Note (Signed)
[facility]  OPERATIVE REPORT    Name:  Cheryl Paul, Cheryl Paul  MR#:   [id number]  DOB:  06/05/1950  ACCOUNT #:  [id number]  DATE OF SERVICE:  02/21/2018    PREOPERATIVE DIAGNOSES:  Clinically severe obesity with a body mass index of 48 and obesity-related comorbidities of hypertension, adult-onset diabetes mellitus, hypertriglyceridemia, hypercholesterolemia, reactive airways disease, stress urinary incontinence, CPAP-dependent obstructive sleep apnea, and weight-related arthropathy.    POSTOPERATIVE DIAGNOSES:  1.  Extensive intraabdominal adhesive disease.  2.  Clinically severe obesity with a body mass index of 48 and obesity-related comorbidities consisting of hypertension, adult-onset diabetes mellitus, hypertriglyceridemia, hypercholesterolemia, reactive airways disease, stress urinary incontinence, CPAP-dependent obstructive sleep apnea, and weight-related arthropathy.  3.  Hepatic steatosis secondary to metabolic syndrome.    PROCEDURES PERFORMED:  1.  Extensive laparoscopic lysis of adhesions requiring 68 minutes of operative time.  2.  Laparoscopic Roux-en-Y gastric bypass utilizing a 15 mL separate tubularized gastric pouch placed on the lesser curvature of the stomach, a 100-cm retrocolic retrogastric Roux limb, and a 40-cm biliopancreatic limb.  3.  Laparoscopic left hepatic lobe wedge biopsy.    SURGEON:  Jaclyn Prime. Karleen Hampshire, DO    ASSISTANT: None    ANESTHESIA:  General endotracheal supplemented at the conclusion of the operative procedure with local infiltration to the operative sites with equal mixture of 1% lidocaine plain mixed with equal portions of 0.5% Marcaine diluted with 1:100,000 of epinephrine, utilizing a total volume of 50 mL.    COMPLICATIONS:  None.    SPECIMENS REMOVED:  Left hepatic lobe wedge biopsy.    IMPLANTS: None.    ESTIMATED BLOOD LOSS:  200 mL.    URINE OUTPUT:  700 mL.    OPERATIVE FINDINGS:  The patient had extensive intraabdominal adhesive  disease secondary to at least six previous abdominal surgical procedures.  The adhesions consisted of omental, transverse colon, and small bowel adhesions to the anterior abdominal wall in addition to colonic and mesocolic adhesions to the small bowel and anterior mesenteric adhesions of the small bowel.    INDICATIONS FOR SURGICAL PROCEDURE:  This is a 67 year old black female, who has failed medical management of her clinically severe obesity, who after investigation of the surgical options, has elected to pursue with laparoscopic potentially open Roux-en-Y gastric bypass to achieve definitive durable weight loss on a personal level with expected resolution of obesity-related comorbidities.  The risks and benefits of operative versus nonoperative potential alternatives and potential complications to include but not limited to undesired cosmetic result; conversion to open procedure; wound hematoma, seroma, or infection; inadequate or excessive weight loss; injury to any intraabdominal or retroperitoneal structure; vitamin or mineral deficiencies; malnutrition; DVT; PE; pneumonia; myocardial infarction; stroke; and potentially death were all discussed in detail with the patient prior to surgical procedure, who voiced her understanding and wished to proceed.    DESCRIPTION OF THE PROCEDURE:  The patient received Ancef for antibiotic prophylaxis and Lovenox for DVT prophylaxis in the preoperative staging area.  She then signed her operative permit which was properly witnessed and she was transported to the operating room where she was placed in the supine position on the operating table, and SCDs were placed and inflated prior to induction of general endotracheal anesthesia.  A 34-French orogastric tube was placed for gastric decompression, as was a 16-French Foley catheter.  The abdomen was then prepped and draped in the usual sterile fashion.  A time-out procedure was performed according to protocol  and agreed upon by  all personnel in the operating suite.  Because of the patient's extensive abdominal surgical history, a 2-mm transverse incision was made in the left midclavicular line through which a Veress needle was introduced into the peritoneal cavity and utilized to insufflate the peritoneal cavity with carbon dioxide gas, never exceeding a pressure of 15 mmHg.  A transverse 5-mm cutaneous incision was then made 5 cm below the left costal margin in the anterior axial line and a 5-mm zero-degree laparoscope was utilized in conjunction with a 5-mm Optiview trocar and cannula.  It was placed into the peritoneal cavity under direct vision.  The trocar and laparoscope were removed.  The abdomen continued to be insufflated with carbon dioxide gas, never exceeding a pressure of 15 mmHg, and visualization of the anterior abdominal wall with a 30-degree 5-mm laparoscope noted the patient to have extensive adhesive disease involving omental, colonic, and small bowel adhesions to the anterior abdominal wall.  A transverse 12-mm cutaneous incision was then made in the left midclavicular line approximately 15 cm below the level of the costal margin and a 12-mm Optiview trocar and cannula were placed into the peritoneal cavity under direct vision.  Over the next 68 minutes which were required for lysis of adhesions, the omental, small bowel, and transverse colonic adhesions to the anterior abdominal wall were taken down, as were mesocolic and colonic adhesions to the small bowel and multiple interloop small bowel adhesions.  As the lysis of adhesions progressed, additional trocars were added; one was added 15 cm below the xiphisternal junction in the midline through a 12-mm transverse cutaneous incision utilizing a 12-mm Optiview trocar and cannula.  The patient, it should be noted, has no umbilicus.  Therefore, the standard mark was not able to be utilized, and an additional 12-mm transverse cutaneous incision  was then made approximately 15 cm below the right costal margin in the right midclavicular line and a 12-mm Optiview trocar and cannula were placed into the peritoneal cavity under direct vision.  The omentum and transverse colon at this point were elevated superiorly to expose the ligament of Treitz, and at a distance of 40 cm distal to the ligament of Treitz, the jejunum was transected utilizing a triple-load stapling device.  The jejunum was transected with a 60 mm stapling device which was loaded with triple load 5 mm staples.  The Harmonic scalpel was then utilized to extend the mesenteric transection to the base of the mesentery.  The Roux limb was measured to 100 cm in length.  At this point, on its antimesenteric border, an enterotomy was created with a Harmonic scalpel.  A similar antimesenteric enterotomy was created 2 cm proximal to the staple line at the biliopancreatic limb and a stapled side-to-side functional end-to-side jejunojejunostomy was constructed utilizing a triple-load stapling device 60 mm in length loaded with 2.5-mm staples, introducing one arm of the stapling device into each of the respective limbs prior to firing it on the antimesenteric borders.  The remaining enteric defect was then closed with a running 2-0 Vicryl suture.  The ligament of Treitz was then identified, and just anterior to the ligament of Treitz, a fenestration was created  through the mesocolon into the lesser sac utilizing a Harmonic scalpel, and the Roux limb was placed through this incision into lesser sac.  The omentum and transverse colon were reflected back to their normal anatomic positions.  The crus was identified along the lesser curvature of the stomach.  Just inferior to the crus, the omentum  was separated from the gastric wall utilizing the Harmonic scalpel until the Roux limb was visualized within the lesser sac.  A transverse 12-mm cutaneous incision  was then made approximately 5 cm below the xiphisternal junction in the midline, through which a trocar was placed into the peritoneal cavity under direct vision.  This was removed and replaced with a 10-mm stone grasping forceps utilized to elevate the left lobe of the liver in conjunction with a self-retaining retractor.  The peritoneum overlying the angle of His was opened with the Harmonic scalpel.  At 5 cm distal to the GE junction and along the lesser curvature of the stomach, the neurovascular elements of the lesser omentum were separated from the gastric wall utilizing a Harmonic scalpel.  Upon completion of this, lesser curve fenestration was accomplished utilizing esophageal retractor introduced posterior to the stomach through the omental fenestration.  The 34-French orogastric tube was removed.  A triple-load stapling device 60 mm in length loaded with 2.5-mm staples was introduced into the lesser curve fenestration and was oriented 5 cm distal to the GE junction, transversely to the lesser curvature prior to firing one-third the length of its staple load.  It should be noted that the 34-French orogastric tube had been retracted into esophagus prior to initiating construction of the gastric pouch.  A second 60-mm triple-load stapling device loaded with 2.5-mm staples was then introduced and used to initiate the vertical aspect of the tubularized gastric pouch placed along the lesser curvature of the stomach, firing one-third of the length of the staple load.  Utilizing the 34-French orogastric tube, it was decided to put the remainder of the gastric pouch which was constructed with sequential firings of a triple-load stapling device 60 mm in length, loaded with 2.5-mm staples ending the transection at the angle of His, it should be noted that the second firing utilized a buttressing strip this created a 15 mL separate tubularized gastric pouch placed on the lesser curvature of  the stomach.  The Roux limb was then elevated in the posterior stomach in retrogastric position where a tension-free hand-sewn tubularized gastrojejunostomy was then constructed.  The geometric orientation of the gastrojejunostomy was at the distal aspect of the tubularized gastric pouch and the antimesenteric border of the Roux limb 2 cm distal to the staple line.  On the posterior Roux limb, a running seromuscular 3-0 Vicryl suture was placed.  A transverse 12-mm gastrotomy was made at the distal aspect of the tubularized gastric pouch and adjacent antimesenteric 12 mm Roux limb enterotomy.  A running inner layer of full-thickness 3-0 Vicryl suture was then initiated at the left lateral posterior aspect of the anastomosis, running across the posterior aspect of the anastomosis around the right lateral aspect of the anastomosis to the anterior midline.  A second full-thickness 3-0 Vicryl suture was begun adjacent to the origin of first one around the left lateral aspect of the anastomosis to the anterior midline.  A 34-French orogastric tube was then advanced across the gastrojejunal anastomosis into the Roux limb prior to tying the running inner layer of full-thickness 3-0 Vicryl sutures together anteriorly.  The gastrojejunostomy was then completed anteriorly utilizing a running seromuscular 3-0 Vicryl suture.  The 34-French orogastric tube was removed and after ensuring there was adequate redundancy of the Roux limb above the level of the mesocolon and the 34-French orogastric catheter had been removed, the omentum was then reflected  superiorly and closure of the mesocolic defect was accomplished with a series of  simple interrupted 2-0 silk sutures.  The space which appears concerning about the curve was then closed with a running 2-0 silk suture.  The omentum was then returned to its normal anatomic position, and the Roux limb was noted to be viable with adequate redundancy above the level  of the mesocolon.  There was no tension over the gastrojejunal anastomosis.  The self-retaining retractor and stone-grasping forceps were removed.  The patient, it should be noted, had a very enlarged liver with morphologic appearance of hepatic steatosis, and decision was made to perform a biopsy.  This was accomplished utilizing a scissors to excise a 1.5 cm wedge-shaped biopsy, which was passed off the field for specimen.  Hemostasis was then established with electrocautery.  The midline 12-mm trocar sites were then closed with a suture-passing device and a #2 Vicryl suture under direct vision utilizing standard technique.  All operative sites were inspected and noted to be hemostatic.  The abdomen was then desufflated of carbon dioxide gas.  Trocars were removed under direct vision.  The fascial sutures were then tied.  The wounds were irrigated with normal saline solution, and closure of the skin was accomplished with a series of simple interrupted buried deep dermal 4-0 Monocryl sutures.  The wounds were infiltrated with an equal mixture of 1% lidocaine plain mixed with equal portions of 0.5% Marcaine diluted with 1:100,000 epinephrine utilizing a total volume of 50 mL.  Steri-Strips were applied as were sterile dressings.  Sponge and needle counts were correct both during and at the completion of the procedure.  The patient tolerated the procedure without intraoperative complications, subsequently was extubated and transported to the recovery room awake, alert, and in stable condition.  Estimated blood loss was 200 mL.  The patient received 2 L of crystalloid during the procedure, and had an urine output of 700 mL.  The operative start time was 10:31, completion time was 13:33.      Warren Danes, DO      [id number]  D:  02/21/2018 14:02  T:  02/22/2018 12:46  JOB #:  [id number]

## 2018-02-22 NOTE — Progress Notes (Signed)
Chaplain conducted an initial consultation and Spiritual Assessment for Cheryl Paul, who is a 67 y.o.,female. Patient???s Primary Language is: [place].   According to the patient???s EMR Religious Affiliation is: No preference.     The reason the Patient came to the hospital is:   Patient Active Problem List    Diagnosis Date Noted   ??? Morbid obesity with BMI of 45.0-49.9, adult (HCC) 02/21/2018   ??? Obesity, morbid (HCC) 08/16/2017   ??? Internal hemorrhoids 04/18/2014   ??? Colon polyps 04/18/2014   ??? Vitamin D deficiency 02/19/2014   ??? Chronic infection of sinus 11/14/2013   ??? Type 2 diabetes mellitus without complication (HCC) 11/14/2013   ??? Incontinence in female 11/14/2013   ??? Candidal intertrigo 11/14/2013   ??? Arthritis, multiple joint involvement 11/14/2013   ??? Essential hypertension 11/14/2013   ??? COPD (chronic obstructive pulmonary disease) (HCC) 11/14/2013   ??? Hyperlipidemia 11/14/2013        The Chaplain provided the following Interventions:  Initiated a relationship of care and support with patient in room 2201 today on day one post surgery.  Listened empathically as patient talked about her surgery and how she is feeling today afterwards.  Patient is looking forward to getting home and getting started on this new life endeavor..  Provided information about Spiritual Care Services.  Offered prayer and assurance of continued prayers on patients behalf.     The following outcomes were achieved:  Patient shared limited information about her medical narrative and spiritual journey/beliefs.  Patient processed feeling about current hospitalization.  Patient expressed gratitude for pastoral care visit.    Assessment:  Patient does not have any religious/cultural needs that will affect patient???s preferences in health care.  There are no further spiritual or religious issues which require Spiritual Care Services interventions at this time.       Plan:   Chaplains will continue to follow and will provide pastoral care on an as needed/requested basis    .Dewaine Oats   Spiritual Care   [phone]

## 2018-02-22 NOTE — Progress Notes (Signed)
Slow to reach goals she is slowly increasing fluids. She is using her IS making progress. She is walking with her walker out in halls,she has walked 3 times today and making progress. Will need more encouragement and she will continue to progress.

## 2018-02-22 NOTE — Other (Signed)
1925: Bedside shift change report given to ZimbabweLolita RN  (oncoming nurse) by Ted McalpineEmily RN  (offgoing nurse). Report included the following information SBAR, Kardex, Intake/Output and MAR.

## 2018-02-22 NOTE — Progress Notes (Addendum)
Surgery Progress Note    02/22/2018    Admit Date: 02/21/2018    Subjective:     Patient has complaints of pain and nausea Pain is controlled with current pain medication. Pain began shortly after ambulating to bathroom and back to chair. Patient has been ambulating in halls.  She reports nausea and no vomiting.  Bowel Movements: None Flatus: none    Objective:     Blood pressure 159/85, pulse 85, temperature 98.9 ??F (37.2 ??C), resp. rate 16, height 5' 3" (1.6 m), weight 123.8 kg (273 lb), SpO2 100 %, not currently breastfeeding.    No intake/output data recorded.    10/06 1901 - 10/08 0700  In: 6417.5 [P.O.:270; I.V.:6147.5]  Out: 1615 [Urine:1415]    EXAM: GENERAL: alert, pleasant, no distress   HEART: regular rate and rhythm   LUNGS: clear to auscultation   ABDOMEN:  Soft, obese,  tender at incision site, mild distended,  incisions clean, dry, no erythema or drainage   EXTREMITIES: warm, well perfused    Data Review    Recent Results (from the past 24 hour(s))   METABOLIC PANEL, BASIC    Collection Time: 02/21/18  9:38 AM   Result Value Ref Range    Sodium 142 136 - 145 mmol/L    Potassium 4.1 3.5 - 5.5 mmol/L    Chloride 108 100 - 111 mmol/L    CO2 25 21 - 32 mmol/L    Anion gap 9 3.0 - 18 mmol/L    Glucose 114 (H) 74 - 99 mg/dL    BUN 17 7.0 - 18 MG/DL    Creatinine 0.66 0.6 - 1.3 MG/DL    BUN/Creatinine ratio 26 (H) 12 - 20      GFR est AA >60 >60 ml/min/1.73m2    GFR est non-AA >60 >60 ml/min/1.73m2    Calcium 9.4 8.5 - 10.1 MG/DL   GLUCOSE, POC    Collection Time: 02/21/18  2:02 PM   Result Value Ref Range    Glucose (POC) 154 (H) 70 - 110 mg/dL   GLUCOSE, POC    Collection Time: 02/21/18 10:22 PM   Result Value Ref Range    Glucose (POC) 148 (H) 70 - 110 mg/dL   CBC W/O DIFF    Collection Time: 02/22/18  4:00 AM   Result Value Ref Range    WBC 10.4 4.6 - 13.2 K/uL    RBC 4.40 4.20 - 5.30 M/uL    HGB 11.8 (L) 12.0 - 16.0 g/dL    HCT 38.2 35.0 - 45.0 %    MCV 86.8 74.0 - 97.0 FL    MCH 26.8 24.0 - 34.0 PG     MCHC 30.9 (L) 31.0 - 37.0 g/dL    RDW 14.1 11.6 - 14.5 %    PLATELET 168 135 - 420 K/uL    MPV 13.1 (H) 9.2 - 11.8 FL   METABOLIC PANEL, BASIC    Collection Time: 02/22/18  4:00 AM   Result Value Ref Range    Sodium 139 136 - 145 mmol/L    Potassium 4.1 3.5 - 5.5 mmol/L    Chloride 104 100 - 111 mmol/L    CO2 23 21 - 32 mmol/L    Anion gap 12 3.0 - 18 mmol/L    Glucose 139 (H) 74 - 99 mg/dL    BUN 12 7.0 - 18 MG/DL    Creatinine 0.64 0.6 - 1.3 MG/DL    BUN/Creatinine ratio 19 12 - 20        GFR est AA >60 >60 ml/min/1.73m2    GFR est non-AA >60 >60 ml/min/1.73m2    Calcium 8.8 8.5 - 10.1 MG/DL   MAGNESIUM    Collection Time: 02/22/18  4:00 AM   Result Value Ref Range    Magnesium 1.5 (L) 1.6 - 2.6 mg/dL   GLUCOSE, POC    Collection Time: 02/22/18  5:23 AM   Result Value Ref Range    Glucose (POC) 154 (H) 70 - 110 mg/dL       Assessment:   Cheryl Paul is a 67 y.o. female,  day 1 status post Gastric Bypass.  Condition: stable    Plan:   -Ambulate every four hours  - Pain managed with PO Tylenol, Roxicodone prn pain not controlled by previous prior to d/c   -Nausea managed prior to d/c   -Advance to Clear liquid Gastric Bypass Diet, if able to tolerate clear liquid diet (with pro shake) 4oz per hour for 3 hours prior to d/c  -Diabetic educator to see patient for hyperglycemia management post bypass  If patient continue to progress d/c home later today     Cheryl D Anderson, NP  8:37 AM  02/22/2018     Attestation:  I have personally seen and examined this patient and independently confirmed all of the above findings noted by the Nurse Practitioner.  I have independently assessed her ongoing problems and formulated the plan of treatment which will be carried out as per above.    Sharetta Ricchio, DO, FACS

## 2018-02-22 NOTE — Progress Notes (Signed)
Problem: Falls - Risk of  Goal: *Absence of Falls  Description  Document Cheryl Paul Fall Risk and appropriate interventions in the flowsheet.  Outcome: Progressing Towards Goal  Note:   Fall Risk Interventions:  Mobility Interventions: Patient to call before getting OOB         Medication Interventions: Teach patient to arise slowly    Elimination Interventions: Call light in reach              Problem: Patient Education: Go to Patient Education Activity  Goal: Patient/Family Education  Outcome: Progressing Towards Goal     Problem: Pressure Injury - Risk of  Goal: *Prevention of pressure injury  Description  Document Braden Scale and appropriate interventions in the flowsheet.  Outcome: Progressing Towards Goal  Note:   Pressure Injury Interventions:       Moisture Interventions: Offer toileting Q_hr    Activity Interventions: Increase time out of bed    Mobility Interventions: Pressure redistribution bed/mattress (bed type)    Nutrition Interventions: Document food/fluid/supplement intake                     Problem: Patient Education: Go to Patient Education Activity  Goal: Patient/Family Education  Outcome: Progressing Towards Goal     Problem: Pain  Goal: *Control of Pain  Outcome: Progressing Towards Goal     Problem: Patient Education: Go to Patient Education Activity  Goal: Patient/Family Education  Outcome: Progressing Towards Goal     Problem: Patient Education: Go to Patient Education Activity  Goal: Patient/Family Education  Outcome: Progressing Towards Goal     Problem: Laparoscopic Gastric Bypass:Day of Surgery  Goal: Off Pathway (Use only if patient is Off Pathway)  Outcome: Progressing Towards Goal  Goal: Activity/Safety  Outcome: Progressing Towards Goal  Goal: Consults, if ordered  Outcome: Progressing Towards Goal  Goal: Diagnostic Test/Procedures  Outcome: Progressing Towards Goal  Goal: Nutrition/Diet  Outcome: Progressing Towards Goal  Goal: Medications  Outcome: Progressing Towards Goal   Goal: Respiratory  Outcome: Progressing Towards Goal  Goal: Treatments/Interventions/Procedures  Outcome: Progressing Towards Goal  Goal: Psychosocial  Outcome: Progressing Towards Goal  Goal: *No signs and symptoms of infection or wound complications  Outcome: Progressing Towards Goal  Goal: *Optimal pain control at patient's stated goal  Outcome: Progressing Towards Goal  Goal: *Adequate urinary output (equal to or greater than 30 milliliters/hour)  Outcome: Progressing Towards Goal  Goal: *Hemodynamically stable  Outcome: Progressing Towards Goal  Goal: *Tolerating diet  Outcome: Progressing Towards Goal  Goal: *Demonstrates progressive activity  Outcome: Progressing Towards Goal  Goal: *Absence of signs and symptoms of DVT  Outcome: Progressing Towards Goal  Goal: *Labs within defined limits  Outcome: Progressing Towards Goal  Goal: *Oxygen saturation within defined limits  Outcome: Progressing Towards Goal     Problem: Discharge Planning  Goal: *Discharge to safe environment  Outcome: Progressing Towards Goal

## 2018-02-22 NOTE — Progress Notes (Signed)
Problem: Discharge Planning  Goal: *Discharge to safe environment  Outcome: Progressing Towards Goal

## 2018-02-22 NOTE — Other (Signed)
Discharge/Transition Planning    Problem: Discharge Planning  Goal: *Discharge to safe environment  Outcome: Progressing Towards Goal     Reason for Admission:   Bariatric Surgery                  RRAT Score:     17             Do you (patient/family) have any concerns for transition/discharge?     Not at time              Plan for utilizing home health:   n/a    Current Advanced Directive/Advance Care Plan:  none            Transition of Care Plan:        Interviewed patient. Verified demographics listed on face sheet with patient; all information correct.Pt with [employer] medicare Patient stated their PCP is Dr Nadara Eaton and saw 2 mo ago. Patient lives in single family home and daughter and grandchild staying with her Patient's NOK is daughter. Patient independent with ADLs prior to admission. DME prior to admission: [facility]; rollator; walker. Discharge plan is Home      Patient has designated __daughter______________________ to participate in his/her discharge plan and to receive any needed information.   Fotini, Lemus Daughter   [phone]       Care Management Interventions  PCP Verified by CM: Yes(Dr Paragas)  Last Visit to PCP: 12/23/17  Mode of Transport at Discharge: Other (see comment)  Transition of Care Consult (CM Consult): Discharge Planning  Current Support Network: Own Home, Other(with daughter)  Confirm Follow Up Transport: Family  Plan discussed with Pt/Family/Caregiver: Yes  Discharge Location  Discharge Placement: Home

## 2018-02-22 NOTE — Progress Notes (Signed)
Surgery Progress Note    02/22/2018    Admit Date: 02/21/2018    Subjective:     Patient has complaints of pain and nausea Pain is controlled with current pain medication. Pain began shortly after ambulating to bathroom and back to chair. Patient has been ambulating in halls.  She reports nausea and no vomiting.  Bowel Movements: None Flatus: none    Objective:     Blood pressure 159/85, pulse 85, temperature 98.9 ??F (37.2 ??C), resp. rate 16, height 5\' 3"  (1.6 m), weight 123.8 kg (273 lb), SpO2 100 %, not currently breastfeeding.    No intake/output data recorded.    10/06 1901 - 10/08 0700  In: 6417.5 [P.O.:270; I.V.:6147.5]  Out: 1615 [Urine:1415]    EXAM: GENERAL: alert, pleasant, no distress   HEART: regular rate and rhythm   LUNGS: clear to auscultation   ABDOMEN:  Soft, obese,  tender at incision site, mild distended,  incisions clean, dry, no erythema or drainage   EXTREMITIES: warm, well perfused    Data Review    Recent Results (from the past 24 hour(s))   METABOLIC PANEL, BASIC    Collection Time: 02/21/18  9:38 AM   Result Value Ref Range    Sodium 142 136 - 145 mmol/L    Potassium 4.1 3.5 - 5.5 mmol/L    Chloride 108 100 - 111 mmol/L    CO2 25 21 - 32 mmol/L    Anion gap 9 3.0 - 18 mmol/L    Glucose 114 (H) 74 - 99 mg/dL    BUN 17 7.0 - 18 MG/DL    Creatinine 1.61 0.6 - 1.3 MG/DL    BUN/Creatinine ratio 26 (H) 12 - 20      GFR est AA >60 >60 ml/min/1.74m2    GFR est non-AA >60 >60 ml/min/1.89m2    Calcium 9.4 8.5 - 10.1 MG/DL   GLUCOSE, POC    Collection Time: 02/21/18  2:02 PM   Result Value Ref Range    Glucose (POC) 154 (H) 70 - 110 mg/dL   GLUCOSE, POC    Collection Time: 02/21/18 10:22 PM   Result Value Ref Range    Glucose (POC) 148 (H) 70 - 110 mg/dL   CBC W/O DIFF    Collection Time: 02/22/18  4:00 AM   Result Value Ref Range    WBC 10.4 4.6 - 13.2 K/uL    RBC 4.40 4.20 - 5.30 M/uL    HGB 11.8 (L) 12.0 - 16.0 g/dL    HCT 09.6 04.5 - 40.9 %    MCV 86.8 74.0 - 97.0 FL    MCH 26.8 24.0 - 34.0 PG     MCHC 30.9 (L) 31.0 - 37.0 g/dL    RDW 81.1 91.4 - 78.2 %    PLATELET 168 135 - 420 K/uL    MPV 13.1 (H) 9.2 - 11.8 FL   METABOLIC PANEL, BASIC    Collection Time: 02/22/18  4:00 AM   Result Value Ref Range    Sodium 139 136 - 145 mmol/L    Potassium 4.1 3.5 - 5.5 mmol/L    Chloride 104 100 - 111 mmol/L    CO2 23 21 - 32 mmol/L    Anion gap 12 3.0 - 18 mmol/L    Glucose 139 (H) 74 - 99 mg/dL    BUN 12 7.0 - 18 MG/DL    Creatinine 9.56 0.6 - 1.3 MG/DL    BUN/Creatinine ratio 19 12 - 20  GFR est AA >60 >60 ml/min/1.89m2    GFR est non-AA >60 >60 ml/min/1.51m2    Calcium 8.8 8.5 - 10.1 MG/DL   MAGNESIUM    Collection Time: 02/22/18  4:00 AM   Result Value Ref Range    Magnesium 1.5 (L) 1.6 - 2.6 mg/dL   GLUCOSE, POC    Collection Time: 02/22/18  5:23 AM   Result Value Ref Range    Glucose (POC) 154 (H) 70 - 110 mg/dL       Assessment:   Cheryl Paul is a 67 y.o. female,  day 1 status post Gastric Bypass.  Condition: stable    Plan:   -Ambulate every four hours  - Pain managed with PO Tylenol, Roxicodone prn pain not controlled by previous prior to d/c   -Nausea managed prior to d/c   -Advance to Clear liquid Gastric Bypass Diet, if able to tolerate clear liquid diet (with pro shake) 4oz per hour for 3 hours prior to d/c  -Diabetic educator to see patient for hyperglycemia management post bypass  If patient continue to progress d/c home later today     Margot Chimes, NP  8:37 AM  02/22/2018     Attestation:  I have personally seen and examined this patient and independently confirmed all of the above findings noted by the Nurse Practitioner.  I have independently assessed her ongoing problems and formulated the plan of treatment which will be carried out as per above.    Naida Sleight, DO, FACS

## 2018-02-22 NOTE — Progress Notes (Signed)
0800  Sitting on the chair, informed about her goal to walk, to cal;l if needed help, grandson at bedside, no nausea.    1130  BP elevated asymptomatic, was up  Walking in the hallway. Recheck while resting.    1800  Been up  Walking in the hallway, no emesis, pain pain under control.

## 2018-02-22 NOTE — Progress Notes (Signed)
0720: Patient care assumed at this time. Patient is in bed with family at bedside. IV site is infusing, clean dry and intact. Patient educated on IS, hydration and ambulation. 6 trocar sites on abdomen are clean dry and intact with no redness or drainage. Patient can move all extremities and has sensation present.    0800: ambulate to bathroom    1000: Ambulate to bathroom, lovenox education given.     1200: Patient ambulating in hallway. Denies complaints. Educated on IS use and hydration. Verbalized understanding     1355: Ambulating in hallway, denies complaints. Patient educated regarding hydration and plan of care.     1730: Patient ambulating in hallway. Denies complaints

## 2018-02-22 NOTE — Progress Notes (Signed)
Discharge/Transition Planning    Problem: Discharge Planning  Goal: *Discharge to safe environment  Outcome: Progressing Towards Goal     Reason for Admission:   Bariatric Surgery                  RRAT Score:     17             Do you (patient/family) have any concerns for transition/discharge?     Not at time              Plan for utilizing home health:   n/a    Current Advanced Directive/Advance Care Plan:  none            Transition of Care Plan:        Interviewed patient. Verified demographics listed on face sheet with patient; all information correct.Pt with [employer] Patient stated their PCP is Dr Nadara Eatonparagas and saw 2 mo ago. Patient lives in single family home and daughter and grandchild staying with her Patient's NOK is daughter. Patient independent with ADLs prior to admission. [facility] walker. Discharge plan is Home      Patient has designated __daughter______________________ to participate in his/her discharge plan and to receive any needed information.   Cheryl Paul,Cheryl Paul Daughter   [phone]       Care Management Interventions  PCP Verified by CM: Yes(Dr Paragas)  Last Visit to PCP: 12/23/17  Mode of Transport at Discharge: Other (see comment)  Transition of Care Consult (CM Consult): Discharge Planning  Current Support Network: Own Home, Other(with daughter)  Confirm Follow Up Transport: Family  Plan discussed with Pt/Family/Caregiver: Yes  Discharge Location  Discharge Placement: Home

## 2018-02-22 NOTE — Group Note (Signed)
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  Denies any pain, n/v - tolerating clear liquid gastric bypass diet.  Reviewed her most  recent A1c and BG over last 24 hours -       Your A1C  was      Lab Results         Component  Value  Date/Time            Hemoglobin A1c  9.8 (H)  12/01/2017 08:03 AM     .                Reviewed her PTA  DM medications - she was taking metformin 1,000 mg only once a day - Glipizide 5 mg twice daily.  Was prescribed Toujeo 40 units daily but states she only took this for a few months r/t episodes of hypoglycemia.       Instructed her to continue to monitor her BG twice daily and record for her f/u appointment -  Has f/u with Dr. Karleen HampshireSpencer Oct. 23, 2019 and to see PCP in one month.  No further DM medications indicated at discharge.        Rayann HemanNina Rodriguez MPH RN   Pager [phone]   Office [phone]

## 2018-02-22 NOTE — Progress Notes (Signed)
In bed ans states feeling better. Low grade fever and she was educated on IS. Doing poorly on IS needs much encouragement her nurse is aware. Assisted oob to bathroom with assist of one and her walker. Patient was educated on progression of diet this AM. Goal of 4 ounces per hour with one ounce being protein was clearly understood. Patient was instructed to go slow with small sips to reach goal. Patient given a report card to record intake. Education completed on I.S use and to ambulate in hall at least 4 times.  4782-  When in chair she c/o nausea and pain. Spoke with her her nurse and they will give meds. I will continue to educate and encourage.

## 2018-02-22 NOTE — Progress Notes (Signed)
Problem: Falls - Risk of  Goal: *Absence of Falls  Description  Document Cheryl Paul Fall Risk and appropriate interventions in the flowsheet.  Outcome: Progressing Towards Goal  Note:   Fall Risk Interventions:  Mobility Interventions: Patient to call before getting OOB         Medication Interventions: Teach patient to arise slowly    Elimination Interventions: Call light in reach              Problem: Patient Education: Go to Patient Education Activity  Goal: Patient/Family Education  Outcome: Progressing Towards Goal     Problem: Pressure Injury - Risk of  Goal: *Prevention of pressure injury  Description  Document Braden Scale and appropriate interventions in the flowsheet.  Outcome: Progressing Towards Goal  Note:   Pressure Injury Interventions:       Moisture Interventions: Offer toileting Q_hr    Activity Interventions: Increase time out of bed    Mobility Interventions: Pressure redistribution bed/mattress (bed type)    Nutrition Interventions: Document food/fluid/supplement intake                     Problem: Patient Education: Go to Patient Education Activity  Goal: Patient/Family Education  Outcome: Progressing Towards Goal     Problem: Pain  Goal: *Control of Pain  Outcome: Progressing Towards Goal     Problem: Patient Education: Go to Patient Education Activity  Goal: Patient/Family Education  Outcome: Progressing Towards Goal     Problem: Patient Education: Go to Patient Education Activity  Goal: Patient/Family Education  Outcome: Progressing Towards Goal     Problem: Laparoscopic Gastric Bypass:Day of Surgery  Goal: Off Pathway (Use only if patient is Off Pathway)  Outcome: Progressing Towards Goal  Goal: Activity/Safety  Outcome: Progressing Towards Goal  Goal: Consults, if ordered  Outcome: Progressing Towards Goal  Goal: Diagnostic Test/Procedures  Outcome: Progressing Towards Goal  Goal: Nutrition/Diet  Outcome: Progressing Towards Goal  Goal: Medications  Outcome: Progressing Towards Goal  Goal:  Respiratory  Outcome: Progressing Towards Goal  Goal: Treatments/Interventions/Procedures  Outcome: Progressing Towards Goal  Goal: Psychosocial  Outcome: Progressing Towards Goal  Goal: *No signs and symptoms of infection or wound complications  Outcome: Progressing Towards Goal  Goal: *Optimal pain control at patient's stated goal  Outcome: Progressing Towards Goal  Goal: *Adequate urinary output (equal to or greater than 30 milliliters/hour)  Outcome: Progressing Towards Goal  Goal: *Hemodynamically stable  Outcome: Progressing Towards Goal  Goal: *Tolerating diet  Outcome: Progressing Towards Goal  Goal: *Demonstrates progressive activity  Outcome: Progressing Towards Goal  Goal: *Absence of signs and symptoms of DVT  Outcome: Progressing Towards Goal  Goal: *Labs within defined limits  Outcome: Progressing Towards Goal  Goal: *Oxygen saturation within defined limits  Outcome: Progressing Towards Goal     Problem: Discharge Planning  Goal: *Discharge to safe environment  Outcome: Progressing Towards Goal

## 2018-02-22 NOTE — Progress Notes (Signed)
 Chaplain conducted an initial consultation and Spiritual Assessment for Cheryl Paul, who is a 67 y.o.,female. Patient's Primary Language is: [place].   According to the patient's EMR Religious Affiliation is: No preference.     The reason the Patient came to the hospital is:   Patient Active Problem List    Diagnosis Date Noted   . Morbid obesity with BMI of 45.0-49.9, adult (HCC) 02/21/2018   . Obesity, morbid (HCC) 08/16/2017   . Internal hemorrhoids 04/18/2014   . Colon polyps 04/18/2014   . Vitamin D  deficiency 02/19/2014   . Chronic infection of sinus 11/14/2013   . Type 2 diabetes mellitus without complication (HCC) 11/14/2013   . Incontinence in female 11/14/2013   . Candidal intertrigo 11/14/2013   . Arthritis, multiple joint involvement 11/14/2013   . Essential hypertension 11/14/2013   . COPD (chronic obstructive pulmonary disease) (HCC) 11/14/2013   . Hyperlipidemia 11/14/2013        The Chaplain provided the following Interventions:  Initiated a relationship of care and support with patient in room 2201 today on day one post surgery.  Listened empathically as patient talked about her surgery and how she is feeling today afterwards.  Patient is looking forward to getting home and getting started on this new life endeavor..  Provided information about Spiritual Care Services.  Offered prayer and assurance of continued prayers on patients behalf.     The following outcomes were achieved:  Patient shared limited information about her medical narrative and spiritual journey/beliefs.  Patient processed feeling about current hospitalization.  Patient expressed gratitude for pastoral care visit.    Assessment:  Patient does not have any religious/cultural needs that will affect patient's preferences in health care.  There are no further spiritual or religious issues which require Spiritual Care Services interventions at this time.       Plan:  Chaplains will continue to follow and will provide pastoral care  on an as needed/requested basis    .Elia Mauro Ysidro Jerline Haze Elia   Spiritual Care   [phone]

## 2018-02-22 NOTE — Progress Notes (Signed)
Slow to reach goals she is slowly increasing fluids. She is using her IS making progress. She is walking with her walker out in halls,she has walked 3 times today and making progress. Will need more encouragement and she will continue to progress.

## 2018-02-22 NOTE — Progress Notes (Signed)
 2017: Patient in bed awake,alert and oriented x 4. No signs of distress. Bed low and locked. Ambulating earlier with family. Call bell within reach.Will monitor.  9480: Assisted patient  to ambulate in the hallway,tolerated well,back in recliner,resting now. Uneventful night,no other concern at this time,call bell within reach.Will monitor.  0630: Patient in recliner,resting .Will continue monitoring.  Patient Vitals for the past 24 hrs:   Temp Pulse Resp BP SpO2   02/23/18 0748 98.5 F (36.9 C) 84 18 143/85 100 %   02/23/18 0308 99 F (37.2 C) 75 18 158/88 98 %   02/23/18 0016 99.1 F (37.3 C) 76 18 160/78 98 %   02/22/18 1924 99.3 F (37.4 C) 74 18 (!) 169/94 99 %   02/22/18 1620 98.7 F (37.1 C) 76 18 151/85 99 %   02/22/18 1425 97.7 F (36.5 C) 76 17 141/80 99 %   02/22/18 1110 98.9 F (37.2 C) 72 16 (!) 169/111 98 %

## 2018-02-23 LAB — POCT GLUCOSE
POC Glucose: 131 mg/dL — ABNORMAL HIGH (ref 70–110)
POC Glucose: 134 mg/dL — ABNORMAL HIGH (ref 70–110)

## 2018-02-23 LAB — GLUCOSE, POC
Glucose (POC): 131 mg/dL — ABNORMAL HIGH (ref 70–110)
Glucose (POC): 134 mg/dL — ABNORMAL HIGH (ref 70–110)

## 2018-02-23 MED ORDER — HYOSCYAMINE 0.125 MG TAB, RAPID DISSOLVE
0.125 mg | ORAL_TABLET | Freq: Four times a day (QID) | ORAL | 0 refills | Status: DC | PRN
Start: 2018-02-23 — End: 2018-11-07

## 2018-02-23 MED ORDER — PANTOPRAZOLE 40 MG TAB, DELAYED RELEASE
40 mg | ORAL_TABLET | Freq: Every day | ORAL | 0 refills | Status: DC
Start: 2018-02-23 — End: 2018-11-07

## 2018-02-23 MED ORDER — LISINOPRIL 40 MG TAB
40 mg | ORAL_TABLET | Freq: Every day | ORAL | 0 refills | Status: DC
Start: 2018-02-23 — End: 2018-11-07

## 2018-02-23 MED FILL — PANTOPRAZOLE 40 MG TAB, DELAYED RELEASE: 40 mg | ORAL | Qty: 1

## 2018-02-23 MED FILL — OXYCODONE 5 MG TAB: 5 mg | ORAL | Qty: 1

## 2018-02-23 MED FILL — ACETAMINOPHEN 325 MG TABLET: 325 mg | ORAL | Qty: 2

## 2018-02-23 MED FILL — LOVENOX 40 MG/0.4 ML SUBCUTANEOUS SYRINGE: 40 mg/0.4 mL | SUBCUTANEOUS | Qty: 0.4

## 2018-02-23 MED FILL — GLYCOPYRROLATE 0.6 MG/3 ML (0.2 MG/ML) INTRAVENOUS SYRINGE: 0.6 mg/3 mL (0.2 mg/mL) | INTRAVENOUS | Qty: 0.4

## 2018-02-23 MED FILL — ONDANSETRON (PF) 4 MG/2 ML INJECTION: 4 mg/2 mL | INTRAMUSCULAR | Qty: 2

## 2018-02-23 MED FILL — XYLOCAINE-MPF 20 MG/ML (2 %) INJECTION SOLUTION: 20 mg/mL (2 %) | INTRAMUSCULAR | Qty: 40

## 2018-02-23 MED FILL — VECURONIUM BROMIDE 10 MG IV SOLR: 10 mg | INTRAVENOUS | Qty: 7

## 2018-02-23 MED FILL — LABETALOL 5 MG/ML IV SYRINGE: 20 mg/4 mL (5 mg/mL) | INTRAVENOUS | Qty: 10

## 2018-02-23 MED FILL — LACTATED RINGERS IV: INTRAVENOUS | Qty: 1000

## 2018-02-23 MED FILL — ONDANSETRON HCL (PF) 4 MG/2 ML SYRINGE: 4 mg/2 mL | INTRAMUSCULAR | Qty: 2

## 2018-02-23 MED FILL — NEOSTIGMINE METHYLSULFATE 3 MG/3 ML (1 MG/ML) IV SYRINGE: 3 mg/ mL (1 mg/mL) | INTRAVENOUS | Qty: 3

## 2018-02-23 MED FILL — LISINOPRIL 40 MG TAB: 40 mg | ORAL | Qty: 1

## 2018-02-23 MED FILL — SUCCINYLCHOLINE CHLORIDE 100 MG/5 ML (20 MG/ML) IV SYRINGE: 100 mg/5 mL (20 mg/mL) | INTRAVENOUS | Qty: 5

## 2018-02-23 MED FILL — PROPOFOL 10 MG/ML IV EMUL: 10 mg/mL | INTRAVENOUS | Qty: 150

## 2018-02-23 MED FILL — CEFAZOLIN 3G IN 100 ML 0.9% NS PREMIX: 3 gram/100ml | INTRAVENOUS | Qty: 100

## 2018-02-23 MED FILL — EPHEDRINE 50 MG/5 ML (10 MG/ML) IN NS IV SYRINGE: 50 mg/5 mL (10 mg/mL) | INTRAVENOUS | Qty: 15

## 2018-02-23 NOTE — Other (Signed)
Bedside and Verbal shift change report given to Emily/Erna,RN (oncoming nurse) by Percell MillerLolita Ayo,RN (offgoing nurse). Report included the following information SBAR, Kardex, MAR and Recent Results.

## 2018-02-23 NOTE — Discharge Summary (Addendum)
Bariatric Surgery Discharge Progress Note    Admission Date: 02/21/2018    Discharge Date: 02/23/2018      Admission Diagnosis:    Clinically severe Obesity    Comorbidities:  hypertension, adult-onset diabetes mellitus, hypertriglyceridemia, hypercholesterolemia, reactive airway disease, stricture incontinence, CPAP dependent obstructive sleep apnea, and weight related arthropathy    Discharge Diagnosis:     Clinically Severe Obesity, s/p laparoscopic gastric bypass surgery with comorbidities as listed above    Procedures:   laparoscopic gastric bypass surgery    Postop Complications: none    Hospital Course:  Patient was admitted on 02/21/2018 for scheduled bariatric surgery.  Operation was without significant complication.  Patient admitted to the floor postoperatively, monitored as per protocol.  Diet sequentially advanced beginning POD 1, pain medications transitioned to oral during the hospital course. At the time of discharge, the patient is afebrile, vital signs stable, tolerating a clear liquid diet with protein supplementation, voiding spontaneously, ambulatory with adequate pain control with oral medications and clear surgical sites without evidence of infection.    Discharge Diet:  Clear Liquid Bariatric Diet for 7 days, then soft moist protein diet for 5 weeks    Discharge Medications:   *All medications as per Medical Reconciliation Form"    Flintstones Complete Chewable vitamins, 2 orally daily for life  Calcium Citrate 2000mg  orally daily for life  Vitamin B12 sublingual daily for life  Oxycodone 5mg  tab 1-2 by mouth every 4-6 hours as needed for pain uncontrolled with Tylenol  Enoxaparin (Lovenox) 40mg  sub-Q daily for 7 days    Discharge disposition: home    Local wound care with daily showers, keep wounds clean and dry    Activity: as desired, no lifting greater than 15lbs or situps for 30 days    Special Instructions:    No driving until activity is not influenced by incisional pain and off narcotics   No bath or hot tub until wounds are healed   Pulse and temperature twice daily for 10 days   Notify [facility] for a Temp >100.5 or Pulse>115    Followup with surgeon in 2 weeks      Talbot Grumbling, FNP-BC  [facility]   Office: [phone]    Attestation:  I have personally reviewed the evaluation with the PA-C.  I agree with the plan for the patient's ongoing problems and participated in the formulation of the plan of treatment which will be carried out as per above.    Harley Alto, DO, FACS

## 2018-02-23 NOTE — Other (Signed)
I have reviewed discharge instructions with the patient.  The patient verbalized understanding.

## 2018-02-23 NOTE — Progress Notes (Signed)
Problem: Discharge Planning  Goal: *Discharge to safe environment  Outcome: Resolved/Met   Home and outpt follow up    Care Management Interventions  PCP Verified by CM: Yes(Dr Paragas)  Last Visit to PCP: 12/23/17  Mode of Transport at Discharge: Other (see comment)  Transition of Care Consult (CM Consult): Discharge Planning  Current Support Network: Own Home, Other(with daughter)  Confirm Follow Up Transport: Family  Plan discussed with Pt/Family/Caregiver: Yes  Discharge Location  Discharge Placement: Home

## 2018-02-23 NOTE — Progress Notes (Addendum)
Surgery Progress Note    02/23/2018    Admit Date: 02/21/2018    Subjective:     Patient has complaints of no complaints Pain is controlled with current regimen.  Patient has been ambulating in halls.  She reports no nausea and no vomiting and is tolerating ice chips well.    Objective:     Blood pressure 143/85, pulse 84, temperature 98.5 ??F (36.9 ??C), resp. rate 18, height 5\' 3"  (1.6 m), weight 123.8 kg (273 lb), SpO2 100 %, not currently breastfeeding.      10/07 1901 - 10/09 0700  In: 6575 [P.O.:1320; I.V.:5255]  Out: 2050 [Urine:2050]    EXAM: GENERAL: alert, pleasant, no distress   HEART: regular rate and rhythm   LUNGS: clear to auscultation   ABDOMEN:  Soft, obese, appropriately tender, nondistended, incisions clean, dry, no erythema or drainage   EXTREMITIES: warm, well perfused    Data Review    Recent Results (from the past 24 hour(s))   GLUCOSE, POC    Collection Time: 02/22/18  1:05 PM   Result Value Ref Range    Glucose (POC) 153 (H) 70 - 110 mg/dL   GLUCOSE, POC    Collection Time: 02/22/18  4:57 PM   Result Value Ref Range    Glucose (POC) 132 (H) 70 - 110 mg/dL   GLUCOSE, POC    Collection Time: 02/22/18  9:34 PM   Result Value Ref Range    Glucose (POC) 131 (H) 70 - 110 mg/dL   GLUCOSE, POC    Collection Time: 02/23/18  6:08 AM   Result Value Ref Range    Glucose (POC) 134 (H) 70 - 110 mg/dL       Assessment:   Cheryl Paul is a 67 y.o. female, postop day 2 status post laparoscopic gastric bypass surgery.  Condition: good    Plan:   -Ambulate every four hours  -Oxycodone 5mg  1-2 tabs po every 4-6 hour prn pain uncontrolled by tylenol  -Advance to Clear liquid Gastric Bypass Diet, if able to tolerate clear liquid diet 4oz per hour one of which being a protein supplement, will discharge home later today          Talbot Grumbling, FNP-BC  [facility]   Office: [phone]    Attestation:  I have personally reviewed the evaluation with the PA-C.   I agree with the plan for the patient's ongoing problems and participated in the formulation of the plan of treatment which will be carried out as per above.    Naida Sleight, DO, FACS

## 2018-02-23 NOTE — Progress Notes (Addendum)
Goals met she was educated on discharge instructions below. She understood instructions,she has a copy. She was provided numbers to call if she had any issues or concerns.  Hydration  Hydration is your NUMBER ONE priority.  Dehydration is the most common reason for readmission to the hospital. Dehydration occurs when  your body does not get enough fluid to keep it functioning at its best. Your body also requires fluid  to burn its stored fat calories for energy.  Carry a bottle of water with you all day, even when you are away from home; remind yourself to  drink even if you don???t feel thirsty. Drinking 64 ounces of fluid is your daily goal. You can tell if  you???re getting enough fluid if you???re making clear, light-colored urine five to 10 times per day.  Signs of dehydration can be thirst, headache, hard stools or dizziness upon sitting or standing up.  You should contact your surgeon???s office if you are unable to drink enough fluid to stay hydrated.  ???   [place] after Surgery  ??? No lifting over 15 pounds for four weeks.  ??? No driving while taking the pain medication (about seven to 10 days).  ??? No tub baths, swimming or hot tubs until incisions are healed (about two weeks).  ??? You may shower. Clean incisions daily /gently with soap and check incisions for signs of infection:  ??? Redness around incision.  ??? Swelling at site.  ??? Drainage with an foul odor (pus).  ??? Increase tenderness around incision.  ??? Take your temperature and resting pulse in the morning and evening. Record on tracking form  given to you. Call if your temperature is greater than 101 or your pulse rate is greater than 115.  ??? Please contact your surgeon if you are having excessive abdominal pain (that lasts longer than  four hours and does not improve with prescribed pain medication), vomiting or shortness of breath.  ??? Get up and move ??? do not sit in one place for more than an hour.   ??? You need to WALK (EXERCISE) for 30 minutes per day.  ??? Walking around your house does not count.  ??? Bike, treadmill and elliptical are OK.  ??? NO weight lifting or sit ups.  ??? If constipated take an adult dose of Miralax (available over the counter). Contact the doctor???s  office if Miralax doesn???t help.  ??? You may swallow pills starting the day after surgery as long as they fit inside this circle:  ??? Continue to use your incentive spirometer (breathing machine) for the next couple of weeks to  help prevent pneumonia.  [facility]  Temperature/Heart Rate Log  Take your temperature and heart rate (pulse) twice a day for 14 days. Take both in the morning and  evening at about the same time each day (when you wake up and before you go to bed when you  are relaxed). Please contact your doctor???s office if your:  ??? Temperature is higher than 101 degrees.  ??? Heart rate (pulse) is higher than 115 beats per minute  (normal heart rate is 60 to 100 beats per minute).  How to Take Your Heart Rate (Pulse)  ??? Turn your left hand so that your palm is face-up.  ??? With the index and middle fingers of your right  hand, draw a line from the base of your thumb to  just below the crease in  your wrist. Your fingers  should nestle just to the left of the large tendon that  pops up when you bend your wrist toward you.  ??? Don???t press too hard, that will make the pulse go  away. Use gentle pressure.  ??? Wait. It can take several seconds ??? and several micro-adjustments in the placement of your two  fingers on your wrist ??? to find your pulse. Just keep moving your fingers down or up your wrist  in small increments (and pausing for a few seconds) until you find it.  How to Take Your Pulse Rate  ??? Find a watch with a second hand and place it on your right wrist or on the table next  to your left hand.  ??? After finding your pulse, count the number of beats for 20 seconds.   ??? Multiply by three to get your heart rate, or beats per minute  (or just count for 60 seconds for a math-free option).  ??? Normal, resting heart rate is about 60 to 100 beats per minute.  ??? 46 ???  PATIENT GUIDE TO BARIATRIC [facility]  Lovenox Self Injection Guide  Prepare  Step 1: Wash and dry your hands thoroughly.  Step 2: Sit or lie in a comfortable position and choose an area  on the right or left side of the abdomen at least two inches away  from the belly button.  Step 3: Clean the injection site with an alcohol swab and let dry.  Inject  Step 4: Remove the needle cap by pulling it straight off the syringe and  discard it in a sharps collector.  Step 5: With your other hand, pinch an inch of the cleansed area to  make a fold in the skin. Insert the full length of the needle straight  down ??? at a 90? angle ??? into the fold of skin.  ??? 48 ???  PATIENT GUIDE TO BARIATRIC [facility]  Step 6: Press the plunger with your thumb until the syringe is empty.  Then pull the needle straight out and release the skin fold.  Dispose  Step 7: Point the needle down and away from yourself and others,  and then push down on the plunger to activate the safety shield.  Step 8: Place the used syringe in the sharps collector.  Do NOT expel the air bubble from the syringe before the injection.  Administration should be alternated between the left and right abdominal wall. The whole length  of the needle should be introduced into a skin fold held between the thumb and forefinger; the  skin fold should be held throughout the injection. To minimize bruising, do not rub the injection  site after completion of the injection.  Questions about LOVENOX? Call [phone]  ??? 49 ???  PATIENT GUIDE TO BARIATRIC [facility]  9. DIET AND LIFESTYLE  Key Diet Principles Following Bariatric Surgery   ??? Begin each meal with soft moist high protein foods (i.e. chicken, [place], yogurt, tuna, eggs,  cottage cheese, other fish and seafood).  ??? Consume a minimum of 64 ounces of fluid each day to prevent dehydration. No straws.  ??? No food and fluid together. Stop drinking 30 minutes before a meal. You may begin fluids again  30 minutes after you finish a meal.  ??? Eat very slowly and chew all foods completely.  ??? Keep portions small.  ???  No simple sugars or high fat foods. No carbonated beverages. No caffeine.  ??? Eat three meals per day. No skipping. Avoid snacking between meals.  ??? No alcohol. No smoking.  ??? Two Flintstones Complete Chewable vitamins each day. Take one in the morning and one at night.  ??? 1,500 milligrams Calcium Citrate per day in separate dosages.  ??? Vitamin D 3: 5,000 IU taken per day.  ??? Vitamin B-12: Take 1,000 micrograms sublingually daily.  ??? Iron: 60 milligrams per day from [employer].  ??? Protein supplements of your choice. Must be low sugar (0 to 3 grams), low fat (0 to 3 grams) and  provide at least 35 to 40 grams of protein each day. You need 60 to 70 grams of protein (food  and supplements) each day.  ??? Minimum of 30 minutes of physical activity daily.  Do not tak NSAIDS . Do not take Steroids without your surgeons permission.  ??? 50 ???  PATIENT GUIDE TO BARIATRIC [facility]  Your Priorities After Surgery  ? Fluid: 64 ounces of fluid per day.  ? Protein: 60 to 70 grams of protein per day.  ? Walk every day.  Clear Liquid Diet  One week of clear liquids: minimum of 64 ounces of fluid per day.  Fluid:  ??? Zero calorie liquids.  ??? No caffeine.  ??? No carbonation.  ??? No sugary drinks.  ??? No alcohol.  ??? No straws.  Food  ??? Protein drinks.  ??? Less than 3 grams of sugar and  3 grams of fat per serving.  ??? Protein drink should provide you with  60 to 70 grams of protein.  Soft Protein Diet  Five weeks of soft protein (1 ounce for soft protein, 3 ounces of  yogurt/cottage cheese).  Three meals per day and 1 protein shake. Protein shakes should provide you with 30 grams of  protein on the soft protein diet.  Slow transition:  ??? First week on soft protein diet ??? focus on yogurt, cottage cheese, eggs, vegetarian refried beans,  black beans, kidney beans and white beans. (NO BAKED BEANS.)  ??? Second through fourth week on soft protein diet ??? focus on yogurt, cottage cheese, eggs,  canned tuna, canned chicken, tilapia and fish (needs to be soft enough to be cut up with a fork).  ??? Fifth week on soft protein diet ??? focus on yogurt, cottage cheese, eggs, canned tuna, canned  chicken, tilapia, fish, salmon, chicken breast or [place].  Fluid is your #1 Priority!  Continue clear liquids between meals.  You will need 64 ounces of fluid per day.  Fluids that you can have include:  ??? Water. ??? Zero calorie liquids.  You will need to sip throughout the day and should therefore have a water bottle with you at all  times! No liquids with your meals. Stop 30 minutes before a meal and wait 30 minutes after a meal.  No straws.  Zero calorie liquids.  No caffeine.  No carbonation.  No sugary drinks.  No alcohol.  ??? 51 ???  PATIENT GUIDE TO BARIATRIC [facility]  Protein  You will need 60 to 70 grams of protein per day.  ??? 60 to 70 grams of protein shakes when on the clear liquid diet (two to three shakes per day).  ??? 30 to 50 grams of protein shakes when on the soft protein diet (one shake per day).  Eat  Three Times Per Day  You will need to eat three times per day. My planned times are:  _________________________________________________________  _________________________________________________________  _________________________________________________________  Nausea, Vomiting, Stomach Pain  If you have problems with nausea, vomiting or stomach pain, try:  ??? Eating slowly: 20 to 30 minutes per meal.   ??? Chewing food thoroughly: 20 to 30 chews before food is swallowed.  ??? Small portions: measure portions in medicine cup.  ??? Stopping before feeling full.  ??? AVOIDING SUGAR and FRIED FOOD: sugar will cause dumping syndrome and lead to weight gain.  Exercise  I will need to get a minimum of 30 minutes of exercise per day or 150 minutes of exercise per week.  ??? Walking, swimming, biking or elliptical.  ??? Find something you enjoy!  Vitamins  After surgery, you will need to take the following vitamins for the rest of your life ??? FOREVER.  ??? Vitamin D 3: 5,000 IU per day.  ??? Calcium Citrate: 1,500 milligrams, taken separately.  ??? Flintstones Complete: two per day, taken separately.  ??? Sublingual Vitamin B-12: 1,000 micrograms daily.  ??? Iron for menstruating women or patients with a history of low iron: 65 milligrams daily.  We recommend going to www.bariatricadvantage.com and purchasing iron from there.  The lemon-lime has 60 milligrams. This iron is better absorbed than over-the-counter iron.  ??? 52 ???  PATIENT GUIDE TO BARIATRIC [facility]  Clear Liquid Log  Getting your fluid in is top priority during this week.  Fluids (MINIUM of 64 ounces per day):  ? 8 oz. ? 8 oz. ? 8 oz. ? 8 oz. ? 8 oz. ? 8 oz.  ? 8 oz. ? 8 oz. ? 8 oz. ? 8 oz. ? 8 oz. ? 8 oz.  Flintstones Complete Chewable: ? a.m. ? p.m.  Calcium Citrate (1,500 milligrams/day):  Pill form  ? Two crushed pills (morning) ? Two crushed pills (afternoon) ? Two crushed pills (evening)  OR Upcal D (powder)  ? One pack/scoop ? One pack/scoop ? One pack/scoop  OR Celebrate Chewable Vitamins (500 mg each) or [facility]age Chewables (500 mg)  ? One chewable (morning) ? One chewable (afternoon) ? One chewable (evening)  OR Liquid Calcium Citrate  ? 1 tbsp. Calcium Citrate ? 1 tbsp. Calcium Citrate ? 1 tbsp. Calcium Citrate  Vitamin D3: ? 5,000 IU daily.  Vitamin B-12: ? 1,000 micrograms per day.   Iron (menstruating women or patients with a history of low iron):  ? 60 milligrams per day from [employer].  Protein drinks (protein drinks should be under 3 grams of sugar and 3 grams of fat).  Protein shake (60 grams per day): ? a.m. ? p.m.  Exercise: ? 30 to 40 minutes per day.  ??? 53 ???  PATIENT GUIDE TO BARIATRIC [facility]  Bariatric Soft and Moist Diet Shopping List  ??? alcium Citrate (1,500 milligrams/day):  Pill form  ? Two crushed pills (morning) ? Two crushed pills (afternoon) ? Two crushed pills (evening)  OR Upcal D (powder)  ? One pack/scoop ? One pack/scoop ? One pack/scoop  OR Celebrate Chewable Vitamins (500 mg each) or [facility] Chewables (500 mg)  ? One chewable (morning) ? One chewable (afternoon) ? One chewable (evening)  OR Liquid Calcium Citrate  ? 1 tbsp. Calcium Citrate ? 1 tbsp. Calcium Citrate ? 1 tbsp. Calcium Citrate  Vitamin D3: ? 5,000 IU daily  Vitamin B-12: ?  1,000 micrograms per day  Iron (menstruating women or  patients with a history of low iron):  ? 60 milligrams per day  from [employer]  Protein drinks (protein drinks should be under  3 grams of sugar and 3 grams of fat).  Protein shake (30 to 40 grams per day):  ? a.m. ? p.m.  Exercise: ? 30 to 40 minutes per  Educated on Diet Progression    [place] Gastric Bypass and Sleeve Dietary Progression    Patient Name:   Date of Surgery:      Ice Chips start once admitted on floor.     Begin Bariatric Clear Liquid Diet on:     Clear Liquid Diet: 64 oz. of fluid per day  o Low calorie, low sugar, non-carbonated beverages  ? Water, Crystal Light, Propel Water, Sugar Free Jell-O, Sugar Free Popsicles, Bouillon  ? Start protein supplement during this stage. (60-70 grams per day)  ? Getting your fluid in and staying hydrated is your #1 priority!    ? The clear liquid diet will last for 7 days.      Begin Bariatric Soft and Moist on: 10/15   ? This stage of the diet will last for 5 weeks, unless otherwise instructed by your surgeon.  ? Begin:  1 week post-op   ? End:  6 weeks post-op (or when you follow up with the Registered Dietitian)    ? Soft, moist, high protein foods: 3 meals per day plus protein supplements.  o   Portions should emphasize on soft protein.  o Portions will be a MAXIMUM of:  o  1 ounce of solid food  o  2-3 ounces of cottage cheese and yogurt.  o Protein supplements should be between meals and provide 30-40 grams per day during soft protein diet.  o Continue to get 64 ounces of fluid in per day.    ? Protein foods that are ok on the Soft and Moist Diet include:  o Slow transition:  o 1st week on soft protein should focus on: Yogurt, cottage cheese, eggs  o 2nd -4th  week on soft protein diet should focus on: yogurt, cottage cheese, eggs, canned tuna, canned chicken, tilapia, fish (needs to be soft enough to be cut up with a fork)  o 5th week on soft protein diet should focus on: Yogurt, cottage cheese, eggs, canned tuna, canned chicken, tilapia, fish, salmon, chicken breast, or [place].    Remember to continue to get 64 ounces of fluid daily on ALL Stages.    To be advanced to Bariatric Maintenance Stage of the bariatric diet, follow up with the dietitian 6 weeks post-op, around:        For any additional questions, please refer to your blue binder that was provided to you at the start of the bariatric program.

## 2018-02-23 NOTE — Progress Notes (Addendum)
0745: Patient care assumed at this time. Incision is clean dry and intact, IV site is clean dry and intact with no redness or drainage. Patient reminded to walk and meet fluid goals.     0820: Patient ambulating.     1000: Patient ambulating in hallway denies complaints.     1300: Patient educated about discharge and ride. Patient is trying to call for a ride from family.

## 2018-02-23 NOTE — Discharge Summary (Signed)
Bariatric Surgery Discharge Progress Note    Admission Date: 02/21/2018    Discharge Date: 02/23/2018      Admission Diagnosis:    Clinically severe Obesity    Comorbidities:  hypertension, adult-onset diabetes mellitus, hypertriglyceridemia, hypercholesterolemia, reactive airway disease, stricture incontinence, CPAP dependent obstructive sleep apnea, and weight related arthropathy    Discharge Diagnosis:     Clinically Severe Obesity, s/p laparoscopic gastric bypass surgery with comorbidities as listed above    Procedures:   laparoscopic gastric bypass surgery    Postop Complications: none    Hospital Course:  Patient was admitted on 02/21/2018 for scheduled bariatric surgery.  Operation was without significant complication.  Patient admitted to the floor postoperatively, monitored as per protocol.  Diet sequentially advanced beginning POD 1, pain medications transitioned to oral during the hospital course. At the time of discharge, the patient is afebrile, vital signs stable, tolerating a clear liquid diet with protein supplementation, voiding spontaneously, ambulatory with adequate pain control with oral medications and clear surgical sites without evidence of infection.    Discharge Diet:  Clear Liquid Bariatric Diet for 7 days, then soft moist protein diet for 5 weeks    Discharge Medications:   *All medications as per Medical Reconciliation Form"    Flintstones Complete Chewable vitamins, 2 orally daily for life  Calcium Citrate 2000mg  orally daily for life  Vitamin B12 1000micrograms sublingual daily for life  Oxycodone 5mg  tab 1-2 by mouth every 4-6 hours as needed for pain uncontrolled with Tylenol  Enoxaparin (Lovenox) 40mg  sub-Q daily for 7 days    Discharge disposition: home    Local wound care with daily showers, keep wounds clean and dry    Activity: as desired, no lifting greater than 15lbs or situps for 30 days    Special Instructions:   No driving until activity is not influenced by incisional pain and  off narcotics   No bath or hot tub until wounds are healed   Pulse and temperature twice daily for 10 days   Notify [facility] for a Temp >100.5 or Pulse>115    Followup with surgeon in 2 weeks      Talbot GrumblingKristi Meibers, FNP-BC  [facility]   Office: [phone]    Attestation:  I have personally reviewed the evaluation with the PA-C.  I agree with the plan for the patient's ongoing problems and participated in the formulation of the plan of treatment which will be carried out as per above.    Harley Altoavud D Halsey Hammen, DO, FACS

## 2018-02-23 NOTE — Progress Notes (Signed)
Surgery Progress Note    02/23/2018    Admit Date: 02/21/2018    Subjective:     Patient has complaints of no complaints Pain is controlled with current regimen.  Patient has been ambulating in halls.  She reports no nausea and no vomiting and is tolerating ice chips well.    Objective:     Blood pressure 143/85, pulse 84, temperature 98.5 ??F (36.9 ??C), resp. rate 18, height 5\' 3"  (1.6 m), weight 123.8 kg (273 lb), SpO2 100 %, not currently breastfeeding.      10/07 1901 - 10/09 0700  In: 6575 [P.O.:1320; I.V.:5255]  Out: 2050 [Urine:2050]    EXAM: GENERAL: alert, pleasant, no distress   HEART: regular rate and rhythm   LUNGS: clear to auscultation   ABDOMEN:  Soft, obese, appropriately tender, nondistended, incisions clean, dry, no erythema or drainage   EXTREMITIES: warm, well perfused    Data Review    Recent Results (from the past 24 hour(s))   GLUCOSE, POC    Collection Time: 02/22/18  1:05 PM   Result Value Ref Range    Glucose (POC) 153 (H) 70 - 110 mg/dL   GLUCOSE, POC    Collection Time: 02/22/18  4:57 PM   Result Value Ref Range    Glucose (POC) 132 (H) 70 - 110 mg/dL   GLUCOSE, POC    Collection Time: 02/22/18  9:34 PM   Result Value Ref Range    Glucose (POC) 131 (H) 70 - 110 mg/dL   GLUCOSE, POC    Collection Time: 02/23/18  6:08 AM   Result Value Ref Range    Glucose (POC) 134 (H) 70 - 110 mg/dL       Assessment:   Cheryl Paul is a 67 y.o. female, postop day 2 status post laparoscopic gastric bypass surgery.  Condition: good    Plan:   -Ambulate every four hours  -Oxycodone 5mg  1-2 tabs po every 4-6 hour prn pain uncontrolled by tylenol  -Advance to Clear liquid Gastric Bypass Diet, if able to tolerate clear liquid diet 4oz per hour one of which being a protein supplement, will discharge home later today          Talbot GrumblingKristi Meibers, FNP-BC  [facility]   Office: [phone]    Attestation:  I have personally reviewed the evaluation with the PA-C.  I agree with the plan for the  patient's ongoing problems and participated in the formulation of the plan of treatment which will be carried out as per above.    Naida Sleightavid Ferrell Flam, DO, FACS

## 2018-02-23 NOTE — Progress Notes (Signed)
Goals met she was educated on discharge instructions below. She understood instructions,she has a copy. She was provided numbers to call if she had any issues or concerns.  Hydration  Hydration is your NUMBER ONE priority.  Dehydration is the most common reason for readmission to the hospital. Dehydration occurs when  your body does not get enough fluid to keep it functioning at its best. Your body also requires fluid  to burn its stored fat calories for energy.  Carry a bottle of water with you all day, even when you are away from home; remind yourself to  drink even if you don't feel thirsty. Drinking 64 ounces of fluid is your daily goal. You can tell if  you're getting enough fluid if you're making clear, light-colored urine five to 10 times per day.  Signs of dehydration can be thirst, headache, hard stools or dizziness upon sitting or standing up.  You should contact your surgeon's office if you are unable to drink enough fluid to stay hydrated.  --   [facility]  General Care after Surgery  . No lifting over 15 pounds for four weeks.  . No driving while taking the pain medication (about seven to 10 days).  . No tub baths, swimming or hot tubs until incisions are healed (about two weeks).  . You may shower. Clean incisions daily /gently with soap and check incisions for signs of infection:  -- Redness around incision.  -- Swelling at site.  -- Drainage with an foul odor (pus).  -- Increase tenderness around incision.  . Take your temperature and resting pulse in the morning and evening. Record on tracking form  given to you. Call if your temperature is greater than 101 or your pulse rate is greater than 115.  Marland Kitchen Please contact your surgeon if you are having excessive abdominal pain (that lasts longer than  four hours and does not improve with prescribed pain medication), vomiting or shortness of breath.  . Get up and move -- do not sit in one place for more than an hour.  . You need to  WALK (EXERCISE) for 30 minutes per day.  -- Walking around your house does not count.  -- Bike, treadmill and elliptical are OK.  -- NO weight lifting or sit ups.  . If constipated take an adult dose of Miralax (available over the counter). Contact the doctor's  office if Miralax doesn't help.  . You may swallow pills starting the day after surgery as long as they fit inside this circle:  . Continue to use your incentive spirometer (breathing machine) for the next couple of weeks to  help prevent pneumonia.  [place] [facility]  Temperature/Heart Rate Log  Take your temperature and heart rate (pulse) twice a day for 14 days. Take both in the morning and  evening at about the same time each day (when you wake up and before you go to bed when you  are relaxed). Please contact your doctor's office if your:  . Temperature is higher than 101 degrees.  Marland Kitchen Heart rate (pulse) is higher than 115 beats per minute  (normal heart rate is 60 to 100 beats per minute).  How to Take Your Heart Rate (Pulse)  . Turn your left hand so that your palm is face-up.  . With the index and middle fingers of your right  hand, draw a line from the base of your thumb to  just below the crease in  your wrist. Your fingers  should nestle just to the left of the large tendon that  pops up when you bend your wrist toward you.  . Don't press too hard, that will make the pulse go  away. Use gentle pressure.  . Wait. It can take several seconds -- and several micro-adjustments in the placement of your two  fingers on your wrist -- to find your pulse. Just keep moving your fingers down or up your wrist  in small increments (and pausing for a few seconds) until you find it.  How to Take Your Pulse Rate  . Find a watch with a second hand and place it on your right wrist or on the table next  to your left hand.  . After finding your pulse, count the number of beats for 20 seconds.  . Multiply by three to get your heart rate, or beats per  minute  (or just count for 60 seconds for a math-free option).  . Normal, resting heart rate is about 60 to 100 beats per minute.  -- 46 --  PATIENT GUIDE TO BARIATRIC SURGERY  [facility]  Lovenox Self Injection Guide  Prepare  Step 1: Wash and dry your hands thoroughly.  Step 2: Sit or lie in a comfortable position and choose an area  on the right or left side of the abdomen at least two inches away  from the belly button.  Step 3: Clean the injection site with an alcohol swab and let dry.  Inject  Step 4: Remove the needle cap by pulling it straight off the syringe and  discard it in a sharps collector.  Step 5: With your other hand, pinch an inch of the cleansed area to  make a fold in the skin. Insert the full length of the needle straight  down -- at a 90? angle -- into the fold of skin.  -- 48 --  PATIENT GUIDE TO BARIATRIC SURGERY  [place] [facility]  Step 6: Press the plunger with your thumb until the syringe is empty.  Then pull the needle straight out and release the skin fold.  Dispose  Step 7: Point the needle down and away from yourself and others,  and then push down on the plunger to activate the safety shield.  Step 8: Place the used syringe in the sharps collector.  Do NOT expel the air bubble from the syringe before the injection.  Administration should be alternated between the left and right abdominal wall. The whole length  of the needle should be introduced into a skin fold held between the thumb and forefinger; the  skin fold should be held throughout the injection. To minimize bruising, do not rub the injection  site after completion of the injection.  Questions about LOVENOX? Call [phone]  -- 49 --  PATIENT GUIDE TO BARIATRIC SURGERY  [place] [facility]  9. DIET AND LIFESTYLE  Key Diet Principles Following Bariatric Surgery  . Begin each meal with soft moist high protein foods (i.e. chicken, [place], yogurt, tuna, eggs,  cottage  cheese, other fish and seafood).  . Consume a minimum of 64 ounces of fluid each day to prevent dehydration. No straws.  . No food and fluid together. Stop drinking 30 minutes before a meal. You may begin fluids again  30 minutes after you finish a meal.  . Eat very slowly and chew all foods completely.  Marland Kitchen Keep portions small.  Marland Kitchen  No simple sugars or high fat foods. No carbonated beverages. No caffeine.  . Eat three meals per day. No skipping. Avoid snacking between meals.  . No alcohol. No smoking.  . Two Flintstones Complete Chewable vitamins each day. Take one in the morning and one at night.  . 1,500 milligrams Calcium Citrate per day in separate dosages.  . Vitamin D 3: 5,000 IU taken per day.  . Vitamin B-12: Take 1,000 micrograms sublingually daily.  . Iron: 60 milligrams per day from [employer].  . Protein supplements of your choice. Must be low sugar (0 to 3 grams), low fat (0 to 3 grams) and  provide at least 35 to 40 grams of protein each day. You need 60 to 70 grams of protein (food  and supplements) each day.  . Minimum of 30 minutes of physical activity daily.  Do not tak NSAIDS . Do not take Steroids without your surgeons permission.  -- 50 --  PATIENT GUIDE TO BARIATRIC SURGERY  [facility]  Your Priorities After Surgery  ? Fluid: 64 ounces of fluid per day.  ? Protein: 60 to 70 grams of protein per day.  ? Walk every day.  Clear Liquid Diet  One week of clear liquids: minimum of 64 ounces of fluid per day.  Fluid:  . Zero calorie liquids.  . No caffeine.  . No carbonation.  . No sugary drinks.  . No alcohol.  . No straws.  Food  . Protein drinks.  . Less than 3 grams of sugar and  3 grams of fat per serving.  . Protein drink should provide you with  60 to 70 grams of protein.  Soft Protein Diet  Five weeks of soft protein (1 ounce for soft protein, 3 ounces of yogurt/cottage cheese).  Three meals per day and 1 protein shake. Protein shakes should provide you with 30  grams of  protein on the soft protein diet.  Slow transition:  . First week on soft protein diet -- focus on yogurt, cottage cheese, eggs, vegetarian refried beans,  black beans, kidney beans and white beans. (NO BAKED BEANS.)  . Second through fourth week on soft protein diet -- focus on yogurt, cottage cheese, eggs,  canned tuna, canned chicken, tilapia and fish (needs to be soft enough to be cut up with a fork).  . Fifth week on soft protein diet -- focus on yogurt, cottage cheese, eggs, canned tuna, canned  chicken, tilapia, fish, salmon, chicken breast or [place].  Fluid is your #1 Priority!  Continue clear liquids between meals.  You will need 64 ounces of fluid per day.  Fluids that you can have include:  . Water. . Zero calorie liquids.  You will need to sip throughout the day and should therefore have a water bottle with you at all  times! No liquids with your meals. Stop 30 minutes before a meal and wait 30 minutes after a meal.  No straws.  Zero calorie liquids.  No caffeine.  No carbonation.  No sugary drinks.  No alcohol.  -- 51 --  PATIENT GUIDE TO BARIATRIC SURGERY  [place] [facility]  Protein  You will need 60 to 70 grams of protein per day.  . 60 to 70 grams of protein shakes when on the clear liquid diet (two to three shakes per day).  . 30 to 50 grams of protein shakes when on the soft protein diet (one shake per day).  Eat  Three Times Per Day  You will need to eat three times per day. My planned times are:  _________________________________________________________  _________________________________________________________  _________________________________________________________  Nausea, Vomiting, Stomach Pain  If you have problems with nausea, vomiting or stomach pain, try:  . Eating slowly: 20 to 30 minutes per meal.  . Chewing food thoroughly: 20 to 30 chews before food is swallowed.  . Small portions: measure portions in medicine cup.  . Stopping before feeling full.  .  AVOIDING SUGAR and FRIED FOOD: sugar will cause dumping syndrome and lead to weight gain.  Exercise  I will need to get a minimum of 30 minutes of exercise per day or 150 minutes of exercise per week.  . Walking, swimming, biking or elliptical.  . Find something you enjoy!  Vitamins  After surgery, you will need to take the following vitamins for the rest of your life -- FOREVER.  . Vitamin D 3: 5,000 IU per day.  . Calcium Citrate: 1,500 milligrams, taken separately.  . Flintstones Complete: two per day, taken separately.  . Sublingual Vitamin B-12: 1,000 micrograms daily.  . Iron for menstruating women or patients with a history of low iron: 65 milligrams daily.  We recommend going to www.bariatricadvantage.com and purchasing iron from there.  The lemon-lime has 60 milligrams. This iron is better absorbed than over-the-counter iron.  -- 52 --  PATIENT GUIDE TO BARIATRIC SURGERY  [facility]  Clear Liquid Log  Getting your fluid in is top priority during this week.  Fluids (MINIUM of 64 ounces per day):  ? 8 oz. ? 8 oz. ? 8 oz. ? 8 oz. ? 8 oz. ? 8 oz.  ? 8 oz. ? 8 oz. ? 8 oz. ? 8 oz. ? 8 oz. ? 8 oz.  Flintstones Complete Chewable: ? a.m. ? p.m.  Calcium Citrate (1,500 milligrams/day):  Pill form  ? Two crushed pills (morning) ? Two crushed pills (afternoon) ? Two crushed pills (evening)  OR Upcal D (powder)  ? One pack/scoop ? One pack/scoop ? One pack/scoop  OR Celebrate Chewable Vitamins (500 mg each) or Bariatric Advantage Chewables (500 mg)  ? One chewable (morning) ? One chewable (afternoon) ? One chewable (evening)  OR Liquid Calcium Citrate  ? 1 tbsp. Calcium Citrate ? 1 tbsp. Calcium Citrate ? 1 tbsp. Calcium Citrate  Vitamin D3: ? 5,000 IU daily.  Vitamin B-12: ? 1,000 micrograms per day.  Iron (menstruating women or patients with a history of low iron):  ? 60 milligrams per day from [employer].  Protein drinks (protein drinks should be under 3 grams of sugar and 3 grams of  fat).  Protein shake (60 grams per day): ? a.m. ? p.m.  Exercise: ? 30 to 40 minutes per day.  -- 53 --  PATIENT GUIDE TO BARIATRIC SURGERY  [place] [facility]  Bariatric Soft and Moist Diet Shopping List  . alcium Citrate (1,500 milligrams/day):  Pill form  ? Two crushed pills (morning) ? Two crushed pills (afternoon) ? Two crushed pills (evening)  OR Upcal D (powder)  ? One pack/scoop ? One pack/scoop ? One pack/scoop  OR Celebrate Chewable Vitamins (500 mg each) or Bariatric Advantage Chewables (500 mg)  ? One chewable (morning) ? One chewable (afternoon) ? One chewable (evening)  OR Liquid Calcium Citrate  ? 1 tbsp. Calcium Citrate ? 1 tbsp. Calcium Citrate ? 1 tbsp. Calcium Citrate  Vitamin D3: ? 5,000 IU daily  Vitamin B-12: ?  1,000 micrograms per day  Iron (menstruating women or  patients with a history of low iron):  ? 60 milligrams per day  from [employer]  Protein drinks (protein drinks should be under  3 grams of sugar and 3 grams of fat).  Protein shake (30 to 40 grams per day):  ? a.m. ? p.m.  Exercise: ? 30 to 40 minutes per  Educated on Diet Progression    [place] Gastric Bypass and Sleeve Dietary Progression    Patient Name:   Date of Surgery:      Ice Chips start once admitted on floor.     Begin Bariatric Clear Liquid Diet on:     Clear Liquid Diet: 64 oz. of fluid per day  o Low calorie, low sugar, non-carbonated beverages  - Water, Crystal Light, Propel Water, Sugar Free Jell-O, Sugar Free Popsicles, Bouillon  - Start protein supplement during this stage. (60-70 grams per day)  - Getting your fluid in and staying hydrated is your #1 priority!    - The clear liquid diet will last for 7 days.      Begin Bariatric Soft and Moist on: 10/15  - This stage of the diet will last for 5 weeks, unless otherwise instructed by your surgeon.  - Begin:  1 week post-op   - End:  6 weeks post-op (or when you follow up with the Registered Dietitian)    - Soft, moist, high protein foods:  3 meals per day plus protein supplements.  o   Portions should emphasize on soft protein.  o Portions will be a MAXIMUM of:  o  1 ounce of solid food  o  2-3 ounces of cottage cheese and yogurt.  o Protein supplements should be between meals and provide 30-40 grams per day during soft protein diet.  o Continue to get 64 ounces of fluid in per day.    - Protein foods that are ok on the Soft and Moist Diet include:  o Slow transition:  o 1st week on soft protein should focus on: Yogurt, cottage cheese, eggs  o 2nd -4th  week on soft protein diet should focus on: yogurt, cottage cheese, eggs, canned tuna, canned chicken, tilapia, fish (needs to be soft enough to be cut up with a fork)  o 5th week on soft protein diet should focus on: Yogurt, cottage cheese, eggs, canned tuna, canned chicken, tilapia, fish, salmon, chicken breast, or [place].    Remember to continue to get 64 ounces of fluid daily on ALL Stages.    To be advanced to Bariatric Maintenance Stage of the bariatric diet, follow up with the dietitian 6 weeks post-op, around:        For any additional questions, please refer to your blue binder that was provided to you at the start of the bariatric program.

## 2018-02-23 NOTE — Progress Notes (Signed)
0745: Patient care assumed at this time. Incision is clean dry and intact, IV site is clean dry and intact with no redness or drainage. Patient reminded to walk and meet fluid goals.     0820: Patient ambulating.     1000: Patient ambulating in hallway denies complaints.     1300: Patient educated about discharge and ride. Patient is trying to call for a ride from family.

## 2018-03-02 ENCOUNTER — Inpatient Hospital Stay
Admit: 2018-03-02 | Discharge: 2018-03-03 | Disposition: A | Discharge status: Home / Self Care | Payer: MEDICARE | Attending: Emergency Medicine

## 2018-03-02 ENCOUNTER — Telehealth

## 2018-03-02 ENCOUNTER — Emergency Department: Admit: 2018-03-02 | Payer: MEDICARE | Primary: Family Medicine

## 2018-03-02 DIAGNOSIS — G8918 Other acute postprocedural pain: Secondary | ICD-10-CM

## 2018-03-02 LAB — COMPREHENSIVE METABOLIC PANEL
ALT: 28 U/L (ref 13–56)
AST: 23 U/L (ref 10–38)
Albumin/Globulin Ratio: 0.9 (ref 0.8–1.7)
Albumin: 3.1 g/dL — ABNORMAL LOW (ref 3.4–5.0)
Alkaline Phosphatase: 50 U/L (ref 45–117)
Anion Gap: 10 mmol/L (ref 3.0–18)
BUN: 9 MG/DL (ref 7.0–18)
Bun/Cre Ratio: 13 (ref 12–20)
CO2: 27 mmol/L (ref 21–32)
Calcium: 9.2 MG/DL (ref 8.5–10.1)
Chloride: 106 mmol/L (ref 100–111)
Creatinine: 0.68 MG/DL (ref 0.6–1.3)
EGFR IF NonAfrican American: >60 mL/min/{1.73_m2} (ref >60)
GFR African American: >60 mL/min/{1.73_m2} (ref >60)
Globulin: 3.5 g/dL (ref 2.0–4.0)
Glucose: 96 mg/dL (ref 74–99)
Potassium: 3.1 mmol/L — ABNORMAL LOW (ref 3.5–5.5)
Sodium: 143 mmol/L (ref 136–145)
Total Bilirubin: 0.5 MG/DL (ref 0.2–1.0)
Total Protein: 6.6 g/dL (ref 6.4–8.2)

## 2018-03-02 LAB — URINALYSIS W/ RFLX MICROSCOPIC
Bilirubin, Urine: NEGATIVE
Bilirubin: NEGATIVE
Blood, Urine: NEGATIVE
Blood: NEGATIVE
Glucose, Ur: NEGATIVE mg/dL
Glucose: NEGATIVE mg/dL
Ketone: 80 mg/dL — AB
Ketones, Urine: 80 mg/dL — AB
Leukocyte Esterase, Urine: NEGATIVE
Leukocyte Esterase: NEGATIVE
Nitrite, Urine: NEGATIVE
Nitrites: NEGATIVE
Protein, UA: NEGATIVE mg/dL
Protein: NEGATIVE mg/dL
Specific Gravity, UA: 1.024 (ref 1.005–1.030)
Specific gravity: 1.024 (ref 1.005–1.030)
Urobilinogen, UA, POCT: 1 EU/dL (ref 0.2–1.0)
Urobilinogen: 1 EU/dL (ref 0.2–1.0)
pH (UA): 5 (ref 5.0–8.0)
pH, UA: 5 (ref 5.0–8.0)

## 2018-03-02 LAB — CBC WITH AUTO DIFFERENTIAL
Basophils %: 0 % (ref 0–2)
Basophils Absolute: 0 10*3/uL (ref 0.0–0.1)
Eosinophils %: 4 % (ref 0–5)
Eosinophils Absolute: 0.3 10*3/uL (ref 0.0–0.4)
Hematocrit: 37.4 % (ref 35.0–45.0)
Hemoglobin: 11.8 g/dL — ABNORMAL LOW (ref 12.0–16.0)
Lymphocytes %: 23 % (ref 21–52)
Lymphocytes Absolute: 1.8 10*3/uL (ref 0.9–3.6)
MCH: 27.4 PG (ref 24.0–34.0)
MCHC: 31.6 g/dL (ref 31.0–37.0)
MCV: 87 FL (ref 74.0–97.0)
MPV: 12.1 FL — ABNORMAL HIGH (ref 9.2–11.8)
Monocytes %: 8 % (ref 3–10)
Monocytes Absolute: 0.6 10*3/uL (ref 0.05–1.2)
Neutrophils %: 65 % (ref 40–73)
Neutrophils Absolute: 5.2 10*3/uL (ref 1.8–8.0)
Platelets: 212 10*3/uL (ref 135–420)
RBC: 4.3 M/uL (ref 4.20–5.30)
RDW: 13.7 % (ref 11.6–14.5)
WBC: 7.9 10*3/uL (ref 4.6–13.2)

## 2018-03-02 LAB — METABOLIC PANEL, COMPREHENSIVE
A-G Ratio: 0.9 (ref 0.8–1.7)
ALT (SGPT): 28 U/L (ref 13–56)
AST (SGOT): 23 U/L (ref 10–38)
Albumin: 3.1 g/dL — ABNORMAL LOW (ref 3.4–5.0)
Alk. phosphatase: 50 U/L (ref 45–117)
Anion gap: 10 mmol/L (ref 3.0–18)
BUN/Creatinine ratio: 13 (ref 12–20)
BUN: 9 MG/DL (ref 7.0–18)
Bilirubin, total: 0.5 MG/DL (ref 0.2–1.0)
CO2: 27 mmol/L (ref 21–32)
Calcium: 9.2 MG/DL (ref 8.5–10.1)
Chloride: 106 mmol/L (ref 100–111)
Creatinine: 0.68 MG/DL (ref 0.6–1.3)
GFR est AA: >60 mL/min/{1.73_m2} (ref >60)
GFR est non-AA: >60 mL/min/{1.73_m2} (ref >60)
Globulin: 3.5 g/dL (ref 2.0–4.0)
Glucose: 96 mg/dL (ref 74–99)
Potassium: 3.1 mmol/L — ABNORMAL LOW (ref 3.5–5.5)
Protein, total: 6.6 g/dL (ref 6.4–8.2)
Sodium: 143 mmol/L (ref 136–145)

## 2018-03-02 LAB — CBC WITH AUTOMATED DIFF
ABS. BASOPHILS: 0 10*3/uL (ref 0.0–0.1)
ABS. EOSINOPHILS: 0.3 10*3/uL (ref 0.0–0.4)
ABS. LYMPHOCYTES: 1.8 10*3/uL (ref 0.9–3.6)
ABS. MONOCYTES: 0.6 10*3/uL (ref 0.05–1.2)
ABS. NEUTROPHILS: 5.2 10*3/uL (ref 1.8–8.0)
BASOPHILS: 0 % (ref 0–2)
EOSINOPHILS: 4 % (ref 0–5)
HCT: 37.4 % (ref 35.0–45.0)
HGB: 11.8 g/dL — ABNORMAL LOW (ref 12.0–16.0)
LYMPHOCYTES: 23 % (ref 21–52)
MCH: 27.4 PG (ref 24.0–34.0)
MCHC: 31.6 g/dL (ref 31.0–37.0)
MCV: 87 FL (ref 74.0–97.0)
MONOCYTES: 8 % (ref 3–10)
MPV: 12.1 FL — ABNORMAL HIGH (ref 9.2–11.8)
NEUTROPHILS: 65 % (ref 40–73)
PLATELET: 212 10*3/uL (ref 135–420)
RBC: 4.3 M/uL (ref 4.20–5.30)
RDW: 13.7 % (ref 11.6–14.5)
WBC: 7.9 10*3/uL (ref 4.6–13.2)

## 2018-03-02 MED ORDER — HYDROCODONE-ACETAMINOPHEN 7.5 MG-325 MG TAB
Freq: Once | ORAL | Status: AC
Start: 2018-03-02 — End: 2018-03-02
  Administered 2018-03-02: 22:00:00 via ORAL

## 2018-03-02 MED ORDER — DIATRIZOATE MEGLUMINE & SODIUM 66 %-10 % ORAL SOLN
66-10 % | Freq: Once | ORAL | Status: AC
Start: 2018-03-02 — End: 2018-03-02
  Administered 2018-03-02: 23:00:00 via ORAL

## 2018-03-02 MED ORDER — DIPHENHYDRAMINE HCL 50 MG/ML IJ SOLN
50 mg/mL | Freq: Once | INTRAMUSCULAR | Status: AC
Start: 2018-03-02 — End: 2018-03-02
  Administered 2018-03-02: 22:00:00 via INTRAVENOUS

## 2018-03-02 MED ORDER — IOPAMIDOL 61 % IV SOLN
300 mg iodine /mL (61 %) | Freq: Once | INTRAVENOUS | Status: AC
Start: 2018-03-02 — End: 2018-03-02
  Administered 2018-03-02: 23:00:00 via INTRAVENOUS

## 2018-03-02 MED ORDER — SODIUM CHLORIDE 0.9% BOLUS IV
0.9 % | Freq: Once | INTRAVENOUS | Status: AC
Start: 2018-03-02 — End: 2018-03-02
  Administered 2018-03-02: 22:00:00 via INTRAVENOUS

## 2018-03-02 MED FILL — DIPHENHYDRAMINE HCL 50 MG/ML IJ SOLN: 50 mg/mL | INTRAMUSCULAR | Qty: 1

## 2018-03-02 MED FILL — SODIUM CHLORIDE 0.9 % IV: INTRAVENOUS | Qty: 1000

## 2018-03-02 MED FILL — MD-GASTROVIEW 66 %-10 % ORAL SOLUTION: 66-10 % | ORAL | Qty: 30

## 2018-03-02 MED FILL — ISOVUE-300  61 % INTRAVENOUS SOLUTION: 300 mg iodine /mL (61 %) | INTRAVENOUS | Qty: 100

## 2018-03-02 MED FILL — HYDROCODONE-ACETAMINOPHEN 7.5 MG-325 MG TAB: ORAL | Qty: 1

## 2018-03-02 NOTE — ED Triage Notes (Signed)
The pt arrived in the ER with a cc of Left sided abd pain. 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 She said she was told by Dr. Karleen Hampshire to come to the ER. The pt stated that she brought a stool sample with her.

## 2018-03-02 NOTE — ED Notes (Signed)
Contact isolation cart to doorway, sign in view on wall at entrance

## 2018-03-02 NOTE — ED Notes (Addendum)
Pt reports that she brought a stool sample and has put it in the trash can located in her room; located item that is in a double plastic bag, inside of a tupperware bowl that is inside of a ziplock bag and the smell is extremely foul through all of those layers, it is green/yellow in color and loose, states the very foul smell started yesterday but has had diarrhea since surgery with every meal and sometimes before she even finishes her meal.  Pt understands she will need to give a new sample, provider has been in to view the stool sample that is bagged

## 2018-03-02 NOTE — ED Notes (Signed)
Pt ambulatory back to room from bathroom, slow steady gait

## 2018-03-02 NOTE — ED Notes (Signed)
Pt asleep on stretcher, resting quietly, in nad, updated on delay waiting for CT report, no needs at this time, will continue to monitor

## 2018-03-02 NOTE — ED Notes (Signed)
Patient armband removed and shreddedI have reviewed discharge instructions with the patient and parent.  The patient and parent verbalized understanding. rx x 1 given;

## 2018-03-02 NOTE — ED Notes (Signed)
EMERGENCY DEPARTMENT HISTORY AND PHYSICAL EXAM    5:43 PM      Date: 03/02/2018  Patient Name: Cheryl Paul    History of Presenting Illness     Chief Complaint   Patient presents with   ??? Abdominal Pain   ??? Post-Op Problem         History Provided By: Patient    Additional History (Context): Cheryl Paul is a 67 y.o. female with a history of laparoscopic gastric bypass, surgery 9 days ago, who presents with dull, constant, 8/10, LLQ pain since surgery.  Pt states she has had loose stool every day since surgery.  Pt denies CP, SOB, dyspnea, leg swelling, pain with urination, cough or loss of appetite.    She also denies nausea or vomiting, however she does experience urgent need to have a bowel movement after eating.  Patient denies fever, myalgias or chills.      PCP: Gwyneth Revels, MD    Current Outpatient Medications   Medication Sig Dispense Refill   ??? hyoscyamine sulfate (CYSTOSPAZ) 0.125 mg tablet 1 Tab by SubLINGual route every six (6) hours as needed for Other (Spasms). 17 Tab 0   ??? lisinopril (PRINIVIL, ZESTRIL) 40 mg tablet Take 1 Tab by mouth daily. 30 Tab 0   ??? pantoprazole (PROTONIX) 40 mg tablet Take 1 Tab by mouth Daily (before breakfast). 30 Tab 0   ??? ondansetron (ZOFRAN ODT) 4 mg disintegrating tablet Take 1 Tab by mouth every eight (8) hours as needed for Nausea. 15 Tab 0   ??? cholecalciferol, VITAMIN D3, (VITAMIN D3) 5,000 unit tab tablet Take 5,000 Units by mouth daily.     ??? atorvastatin (LIPITOR) 20 mg tablet TAKE ONE TABLET BY MOUTH ONE TIME DAILY 90 Tab 2   ??? nystatin (MYCOSTATIN) topical cream Apply  to affected area two (2) times a day. 15 g 1   ??? albuterol (PROVENTIL HFA, VENTOLIN HFA, PROAIR HFA) 90 mcg/actuation inhaler Take 1-2 Puffs by inhalation every four (4) hours as needed for Wheezing. 1 Inhaler 0   ??? alcohol swabs padm Use daily to test blood sugar  DX E11.9 100 Pad 1       Past History     Past Medical History:  Past Medical History:   Diagnosis Date   ??? Arthritis      knee, arms,    ??? Asthma    ??? Chronic obstructive pulmonary disease ([place])    ??? Chronic pain    ??? Diabetes ([place])    ??? H/O sinusitis    ??? Hypercholesterolemia    ??? Hypertension    ??? Sleep apnea     NOT USING    ??? Stress incontinence    ??? Toe fracture, left August 2015    4th toe       Past Surgical History:  Past Surgical History:   Procedure Laterality Date   ??? ABDOMEN SURGERY PROC UNLISTED      4 surguries for blockages   ??? HX CARPAL TUNNEL RELEASE Bilateral    ??? HX CHOLECYSTECTOMY     ??? HX GYN     ??? HX HERNIA REPAIR      abdomen   ??? HX HYSTERECTOMY      tubes tied   ??? HX ORTHOPAEDIC      catrpal tunnel right and left   ??? HX POLYPECTOMY  04/18/14       Family History:  Family History   Problem Relation Age of  Onset   ??? Hypertension Mother    ??? Stroke Mother    ??? Diabetes Mother    ??? Thyroid Disease Mother    ??? Arthritis-rheumatoid Mother    ??? Cancer Father         colon polpe   ??? Cancer Maternal Aunt        Social History:  Social History     Tobacco Use   ??? Smoking status: Never Smoker   ??? Smokeless tobacco: Never Used   Substance Use Topics   ??? Alcohol use: No     Alcohol/week: 0.0 standard drinks   ??? Drug use: No       Allergies:  Allergies   Allergen Reactions   ??? Penicillins Itching   ??? Butrans [Buprenorphine] Rash   ??? Codeine Other (comments)     Nausea and abd pain   ??? Iodine Rash   ??? Lactose Diarrhea     Lactose intolerance         Review of Systems       Review of Systems   Constitutional: Negative for chills and fever.   Respiratory: Negative for cough, sputum production and shortness of breath.    Cardiovascular: Negative for chest pain, palpitations, orthopnea and leg swelling.   Gastrointestinal: Positive for abdominal pain and diarrhea. Negative for nausea and vomiting.         Physical Exam     Visit Vitals  BP 136/86   Pulse 84   Temp 99.4 ??F (37.4 ??C)   Resp 18   Ht 5' 3"  (1.6 m)   Wt 122 kg (269 lb)   SpO2 94%   BMI 47.65 kg/m??         Physical Exam    Constitutional: She appears well-developed and well-nourished.   Cardiovascular: Normal rate. Exam reveals no gallop and no friction rub.   No murmur heard.  Pulmonary/Chest: No respiratory distress. She has no wheezes.   Abdominal: She exhibits no distension and no mass. There is tenderness. There is no rebound and no guarding.   LLQ TTP.  No fluctuance, erythremia, swelling or drainage of abdominal surgical sites.    Musculoskeletal: Normal range of motion. No edema or deformity.  There is no calf edema, erythema or calor.  Homan's sign is negative.  Neurological: Pt is alert and oriented to person, place, and time   Skin: Skin is warm and dry.   Psychiatric: Pt has a normal mood and affect;  behavior is normal. Judgment and thought content normal.                     Diagnostic Study Results     Labs -  Recent Results (from the past 12 hour(s))   URINALYSIS W/ RFLX MICROSCOPIC    Collection Time: 03/02/18  5:50 PM   Result Value Ref Range    Color DARK YELLOW      Appearance CLOUDY      Specific gravity 1.024 1.005 - 1.030      pH (UA) 5.0 5.0 - 8.0      Protein NEGATIVE  NEG mg/dL    Glucose NEGATIVE  NEG mg/dL    Ketone 80 (A) NEG mg/dL    Bilirubin NEGATIVE  NEG      Blood NEGATIVE  NEG      Urobilinogen 1.0 0.2 - 1.0 EU/dL    Nitrites NEGATIVE  NEG      Leukocyte Esterase NEGATIVE  NEG  CBC WITH AUTOMATED DIFF    Collection Time: 03/02/18  6:10 PM   Result Value Ref Range    WBC 7.9 4.6 - 13.2 K/uL    RBC 4.30 4.20 - 5.30 M/uL    HGB 11.8 (L) 12.0 - 16.0 g/dL    HCT 37.4 35.0 - 45.0 %    MCV 87.0 74.0 - 97.0 FL    MCH 27.4 24.0 - 34.0 PG    MCHC 31.6 31.0 - 37.0 g/dL    RDW 13.7 11.6 - 14.5 %    PLATELET 212 135 - 420 K/uL    MPV 12.1 (H) 9.2 - 11.8 FL    NEUTROPHILS 65 40 - 73 %    LYMPHOCYTES 23 21 - 52 %    MONOCYTES 8 3 - 10 %    EOSINOPHILS 4 0 - 5 %    BASOPHILS 0 0 - 2 %    ABS. NEUTROPHILS 5.2 1.8 - 8.0 K/UL    ABS. LYMPHOCYTES 1.8 0.9 - 3.6 K/UL    ABS. MONOCYTES 0.6 0.05 - 1.2 K/UL     ABS. EOSINOPHILS 0.3 0.0 - 0.4 K/UL    ABS. BASOPHILS 0.0 0.0 - 0.1 K/UL    DF AUTOMATED     METABOLIC PANEL, COMPREHENSIVE    Collection Time: 03/02/18  6:10 PM   Result Value Ref Range    Sodium 143 136 - 145 mmol/L    Potassium 3.1 (L) 3.5 - 5.5 mmol/L    Chloride 106 100 - 111 mmol/L    CO2 27 21 - 32 mmol/L    Anion gap 10 3.0 - 18 mmol/L    Glucose 96 74 - 99 mg/dL    BUN 9 7.0 - 18 MG/DL    Creatinine 0.68 0.6 - 1.3 MG/DL    BUN/Creatinine ratio 13 12 - 20      GFR est AA >60 >60 ml/min/1.42m    GFR est non-AA >60 >60 ml/min/1.761m   Calcium 9.2 8.5 - 10.1 MG/DL    Bilirubin, total 0.5 0.2 - 1.0 MG/DL    ALT (SGPT) 28 13 - 56 U/L    AST (SGOT) 23 10 - 38 U/L    Alk. phosphatase 50 45 - 117 U/L    Protein, total 6.6 6.4 - 8.2 g/dL    Albumin 3.1 (L) 3.4 - 5.0 g/dL    Globulin 3.5 2.0 - 4.0 g/dL    A-G Ratio 0.9 0.8 - 1.7         Radiologic Studies -   CT ABD PELV W CONT    (Results Pending)         Medical Decision Making   I am the first provider for this patient.    I reviewed the vital signs, available nursing notes, past medical history, past surgical history, family history and social history.    Vital Signs-Reviewed the patient's vital signs.    Records Reviewed: Nursing Notes and Old Medical Records (Time of Review: 5:43 PM)    ED Course: Progress Notes, Reevaluation, and Consults:  5:43 PM  Reviewed results with patient. Discussed need for close outpatient follow-up this week for reassessment. Discussed strict return precautions, including     Provider Notes (Medical Decision Making):  [age]ear old female with gastric bypass surgery 9 day ago who presents with LLQ pain.  Pt denies CP, SOB, leg swelling, and pain with urinating or productive cough.  LLQ TTP with temperature of 99.4.  CBC, CMP, CT of  abdomin with IV contrast were ordered.  IV fluids were given.  Labs are negative for infection or possible bleed for the last 9 days.     8:19 PM :Pt care transferred to Dr. Wynn Banker , ED provider. Pt  complaint(s), current treatment plan, progression and available diagnostic results have been discussed thoroughly.  Rounding occurred: no  Intended Disposition: Home   Pending diagnostic reports and/or labs (please list): CT of abdomen      Diagnosis     Clinical Impression:   1. Abdominal pain, LLQ (left lower quadrant)        Disposition: home     Follow-up Information     Follow up With Specialties Details Why Contact Info    Dale Durham, DO General Surgery Schedule an appointment as soon as possible for a visit  [address]  [place]  [place] [address]  [phone]      [facility] Emergency Medicine  If symptoms worsen [address]  [place] [address]  [phone]           Patient's Medications   Start Taking    No medications on file   Continue Taking    ALBUTEROL (PROVENTIL HFA, VENTOLIN HFA, PROAIR HFA) 90 MCG/ACTUATION INHALER    Take 1-2 Puffs by inhalation every four (4) hours as needed for Wheezing.    ALCOHOL SWABS PADM    Use daily to test blood sugar  DX E11.9    ATORVASTATIN (LIPITOR) 20 MG TABLET    TAKE ONE TABLET BY MOUTH ONE TIME DAILY    CHOLECALCIFEROL, VITAMIN D3, (VITAMIN D3) 5,000 UNIT TAB TABLET    Take 5,000 Units by mouth daily.    HYOSCYAMINE SULFATE (CYSTOSPAZ) 0.125 MG TABLET    1 Tab by SubLINGual route every six (6) hours as needed for Other (Spasms).    LISINOPRIL (PRINIVIL, ZESTRIL) 40 MG TABLET    Take 1 Tab by mouth daily.    NYSTATIN (MYCOSTATIN) TOPICAL CREAM    Apply  to affected area two (2) times a day.    ONDANSETRON (ZOFRAN ODT) 4 MG DISINTEGRATING TABLET    Take 1 Tab by mouth every eight (8) hours as needed for Nausea.    PANTOPRAZOLE (PROTONIX) 40 MG TABLET    Take 1 Tab by mouth Daily (before breakfast).   These Medications have changed    No medications on file   Stop Taking    No medications on file       Dictation disclaimer:  Please note that this dictation was completed with  Dragon, the computer voice recognition software.  Quite often unanticipated grammatical, syntax, homophones, and other interpretive errors are inadvertently transcribed by the computer software.  Please disregard these errors.  Please excuse any errors that have escaped final proofreading.

## 2018-03-02 NOTE — ED Notes (Signed)
Pt reports that she brought a stool sample and has put it in the trash can located in her room; located item that is in a double plastic bag, inside of a tupperware bowl that is inside of a ziplock bag and the smell is extremely foul through all of those layers, it is green/yellow in color and loose, states the very foul smell started yesterday but has had diarrhea since surgery with every meal and sometimes before she even finishes her meal.  Pt understands she will need to give a new sample, provider has been in to view the stool sample that is bagged

## 2018-03-02 NOTE — ED Notes (Signed)
Patient armband removed and shreddedI have reviewed discharge instructions with the patient and parent.  The patient and parent verbalized understanding. rx x 1 given;

## 2018-03-02 NOTE — ED Notes (Signed)
ED Notes  by Orville Govern, PA at 03/02/18 1743                Author: Orville Govern, [place]  Service: EMERGENCY  Author Type: Physician Assistant       Filed: 03/02/18 2020  Date of Service: 03/02/18 1743  Status: Attested           Editor: Orville Govern, [place] (Physician Assistant)  Cosigner: Theodis Sato, MD at 03/02/18 2151          Attestation signed by Theodis Sato, MD at 03/02/18 2151          I was available for consultation in the Emergency Department.       CT returned negative. Small dilation of loop of bowel, possible early SBO or ileus. Low suspicion for this clinically. Likely prolonged post-op pain since running out of analgesia. Given short extension of oxycodone as initially prescribed by surgeon.  She has follow-up in one week. The patient will be discharged home. Findings were discussed at length and questions were answered. Information on all newly prescribed medications was given. the patient was instructed to follow-up with her surgeon, or  return to the Emergency Department with any worsened symptoms or concerns. Return precautions were given.                                    EMERGENCY DEPARTMENT HISTORY AND PHYSICAL EXAM      5:43 PM         Date: 03/02/2018   Patient Name: Cheryl Paul        History of Presenting Illness          Chief Complaint       Patient presents with        ?  Abdominal Pain        ?  Post-Op Problem              History Provided By: Patient      Additional History (Context): Cheryl Paul  is a 67 y.o. female with a history  of laparoscopic gastric bypass, surgery 9 days ago, who presents with dull, constant, 8/10, LLQ pain since surgery.  Pt states she has had loose stool every day since surgery.  Pt denies CP, SOB, dyspnea, leg swelling, pain with urination, cough or loss  of appetite.    She also denies nausea or vomiting, however she does experience urgent need to have a bowel movement after eating.  Patient denies fever, myalgias or chills.         PCP: Gwyneth Revels, MD        Current Outpatient Medications          Medication  Sig  Dispense  Refill           ?  hyoscyamine sulfate (CYSTOSPAZ) 0.125 mg tablet  1 Tab by SubLINGual route every six (6) hours as needed for Other (Spasms).  17 Tab  0     ?  lisinopril (PRINIVIL, ZESTRIL) 40 mg tablet  Take 1 Tab by mouth daily.  30 Tab  0     ?  pantoprazole (PROTONIX) 40 mg tablet  Take 1 Tab by mouth Daily (before breakfast).  30 Tab  0           ?  ondansetron (ZOFRAN ODT) 4 mg disintegrating tablet  Take 1 Tab  by mouth every eight (8) hours as needed for Nausea.  15 Tab  0           ?  cholecalciferol, VITAMIN D3, (VITAMIN D3) 5,000 unit tab tablet  Take 5,000 Units by mouth daily.         ?  atorvastatin (LIPITOR) 20 mg tablet  TAKE ONE TABLET BY MOUTH ONE TIME DAILY  90 Tab  2     ?  nystatin (MYCOSTATIN) topical cream  Apply  to affected area two (2) times a day.  15 g  1     ?  albuterol (PROVENTIL HFA, VENTOLIN HFA, PROAIR HFA) 90 mcg/actuation inhaler  Take 1-2 Puffs by inhalation every four (4) hours as needed for Wheezing.  1 Inhaler  0           ?  alcohol swabs padm  Use daily to test blood sugar  DX E11.9  100 Pad  1             Past History        Past Medical History:     Past Medical History:        Diagnosis  Date         ?  Arthritis            knee, arms,          ?  Asthma       ?  Chronic obstructive pulmonary disease ([facility])       ?  Chronic pain       ?  Diabetes ([place])       ?  H/O sinusitis       ?  Hypercholesterolemia       ?  Hypertension       ?  Sleep apnea            NOT USING          ?  Stress incontinence       ?  Toe fracture, left  August 2015          4th toe           Past Surgical History:     Past Surgical History:         Procedure  Laterality  Date          ?  ABDOMEN SURGERY PROC UNLISTED              4 surguries for blockages          ?  HX CARPAL TUNNEL RELEASE  Bilateral       ?  HX CHOLECYSTECTOMY         ?  HX GYN         ?  HX HERNIA REPAIR               abdomen          ?  HX HYSTERECTOMY              tubes tied          ?  HX ORTHOPAEDIC              catrpal tunnel right and left          ?  HX POLYPECTOMY    04/18/14           Family History:     Family History         Problem  Relation  Age of Onset          ?  Hypertension  Mother       ?  Stroke  Mother       ?  Diabetes  Mother       ?  Thyroid Disease  Mother       ?  Arthritis-rheumatoid  Mother       ?  Cancer  Father                colon polpe          ?  Cancer  Maternal Aunt             Social History:     Social History          Tobacco Use         ?  Smoking status:  Never Smoker     ?  Smokeless tobacco:  Never Used       Substance Use Topics         ?  Alcohol use:  No              Alcohol/week:  0.0 standard drinks         ?  Drug use:  No           Allergies:     Allergies        Allergen  Reactions         ?  Penicillins  Itching     ?  Butrans [Buprenorphine]  Rash     ?  Codeine  Other (comments)             Nausea and abd pain         ?  Iodine  Rash     ?  Lactose  Diarrhea             Lactose intolerance                Review of Systems           Review of Systems    Constitutional: Negative for chills and fever.    Respiratory: Negative for cough, sputum production and shortness of breath.     Cardiovascular: Negative for chest pain, palpitations, orthopnea and leg swelling.    Gastrointestinal: Positive for abdominal pain and diarrhea . Negative for nausea and vomiting.               Physical Exam        Visit Vitals      BP  136/86     Pulse  84     Temp  99.4 ??F (37.4 ??C)     Resp  18     Ht  5' 3"  (1.6 m)     Wt  122 kg (269 lb)     SpO2  94%        BMI  47.65 kg/m??              Physical Exam    Constitutional: She appears well-developed and well-nourished.    Cardiovascular: Normal rate. Exam reveals no gallop and no friction rub.    No murmur heard.   Pulmonary/Chest: No respiratory distress. She has no wheezes.    Abdominal: She exhibits no distension and no mass. There is tenderness.  There is no rebound and no guarding.    LLQ TTP.  No fluctuance, erythremia, swelling or drainage of abdominal surgical sites.  Musculoskeletal: Normal range of motion. No edema or deformity.  There is no calf edema, erythema or calor.  Homan's sign is negative.  Neurological: Pt is alert and oriented to person, place, and  time    Skin: Skin is warm and dry.   Psychiatric: Pt has a normal mood and affect;  behavior is normal. Judgment and thought content normal.                                Diagnostic Study Results        Labs -     Recent Results (from the past 12 hour(s))     URINALYSIS W/ RFLX MICROSCOPIC          Collection Time: 03/02/18  5:50 PM         Result  Value  Ref Range            Color  DARK YELLOW          Appearance  CLOUDY          Specific gravity  1.024  1.005 - 1.030         pH (UA)  5.0  5.0 - 8.0         Protein  NEGATIVE   NEG mg/dL       Glucose  NEGATIVE   NEG mg/dL       Ketone  80 (A)  NEG mg/dL       Bilirubin  NEGATIVE   NEG         Blood  NEGATIVE   NEG         Urobilinogen  1.0  0.2 - 1.0 EU/dL       Nitrites  NEGATIVE   NEG         Leukocyte Esterase  NEGATIVE   NEG         CBC WITH AUTOMATED DIFF          Collection Time: 03/02/18  6:10 PM         Result  Value  Ref Range            WBC  7.9  4.6 - 13.2 K/uL       RBC  4.30  4.20 - 5.30 M/uL       HGB  11.8 (L)  12.0 - 16.0 g/dL       HCT  37.4  35.0 - 45.0 %       MCV  87.0  74.0 - 97.0 FL       MCH  27.4  24.0 - 34.0 PG       MCHC  31.6  31.0 - 37.0 g/dL       RDW  13.7  11.6 - 14.5 %       PLATELET  212  135 - 420 K/uL       MPV  12.1 (H)  9.2 - 11.8 FL       NEUTROPHILS  65  40 - 73 %       LYMPHOCYTES  23  21 - 52 %       MONOCYTES  8  3 - 10 %       EOSINOPHILS  4  0 - 5 %       BASOPHILS  0  0 - 2 %       ABS. NEUTROPHILS  5.2  1.8 - 8.0 K/UL  ABS. LYMPHOCYTES  1.8  0.9 - 3.6 K/UL       ABS. MONOCYTES  0.6  0.05 - 1.2 K/UL       ABS. EOSINOPHILS  0.3  0.0 - 0.4 K/UL       ABS. BASOPHILS  0.0  0.0 - 0.1 K/UL        DF  AUTOMATED          METABOLIC PANEL, COMPREHENSIVE          Collection Time: 03/02/18  6:10 PM         Result  Value  Ref Range            Sodium  143  136 - 145 mmol/L       Potassium  3.1 (L)  3.5 - 5.5 mmol/L       Chloride  106  100 - 111 mmol/L       CO2  27  21 - 32 mmol/L       Anion gap  10  3.0 - 18 mmol/L       Glucose  96  74 - 99 mg/dL       BUN  9  7.0 - 18 MG/DL       Creatinine  0.68  0.6 - 1.3 MG/DL       BUN/Creatinine ratio  13  12 - 20         GFR est AA  >60  >60 ml/min/1.25m       GFR est non-AA  >60  >60 ml/min/1.79m           Calcium  9.2  8.5 - 10.1 MG/DL            Bilirubin, total  0.5  0.2 - 1.0 MG/DL       ALT (SGPT)  28  13 - 56 U/L       AST (SGOT)  23  10 - 38 U/L       Alk. phosphatase  50  45 - 117 U/L       Protein, total  6.6  6.4 - 8.2 g/dL       Albumin  3.1 (L)  3.4 - 5.0 g/dL       Globulin  3.5  2.0 - 4.0 g/dL            A-G Ratio  0.9  0.8 - 1.7             Radiologic Studies -      CT ABD PELV W CONT    (Results Pending)                Medical Decision Making     I am the first provider for this patient.      I reviewed the vital signs, available nursing notes, past medical history, past surgical history, family history and social history.      Vital Signs-Reviewed the patient's vital signs.      Records Reviewed: Nursing Notes and Old Medical Records (Time of Review: 5:43 PM)      ED Course: Progress Notes, Reevaluation, and Consults:   5:43 PM  Reviewed results with patient. Discussed need for close outpatient follow-up this week for reassessment. Discussed strict return precautions, including       Provider Notes (Medical Decision Making):  [age]ear old female with gastric bypass surgery 9 day ago who presents with LLQ pain.  Pt denies CP, SOB, leg swelling, and  pain with urinating or productive  cough.  LLQ TTP with temperature of 99.4.  CBC, CMP, CT of abdomin with IV contrast were ordered.  IV fluids were given.  Labs are negative for infection or possible bleed  for the last 9 days.       8:19 PM :Pt care transferred to Dr. Wynn Banker , ED provider. Pt complaint(s), current treatment plan, progression and available diagnostic results have been discussed thoroughly.   Rounding occurred: no   Intended Disposition: Home    Pending diagnostic reports and/or labs (please list): CT of abdomen           Diagnosis        Clinical Impression:       1.  Abdominal pain, LLQ (left lower quadrant)            Disposition: home         Follow-up Information               Follow up With  Specialties  Details  Why  Contact Info              Dale Durham, DO  General Surgery  Schedule an appointment as soon as possible for a visit    [address]   [place]   [place] [address]   [phone]                 [facility]  Emergency Medicine    If symptoms worsen  [address]   [place] [address]   [phone]                   Patient's Medications       Start Taking          No medications on file       Continue Taking           ALBUTEROL (PROVENTIL HFA, VENTOLIN HFA, PROAIR HFA) 90 MCG/ACTUATION INHALER     Take 1-2 Puffs by inhalation every four (4) hours as needed for Wheezing.           ALCOHOL SWABS PADM     Use daily to test blood sugar  DX E11.9           ATORVASTATIN (LIPITOR) 20 MG TABLET     TAKE ONE TABLET BY MOUTH ONE TIME DAILY           CHOLECALCIFEROL, VITAMIN D3, (VITAMIN D3) 5,000 UNIT TAB TABLET     Take 5,000 Units by mouth daily.           HYOSCYAMINE SULFATE (CYSTOSPAZ) 0.125 MG TABLET     1 Tab by SubLINGual route every six (6) hours as needed for Other (Spasms).           LISINOPRIL (PRINIVIL, ZESTRIL) 40 MG TABLET     Take 1 Tab by mouth daily.           NYSTATIN (MYCOSTATIN) TOPICAL CREAM     Apply  to affected area two (2) times a day.           ONDANSETRON (ZOFRAN ODT) 4 MG DISINTEGRATING TABLET     Take 1 Tab by mouth every eight (8) hours as needed for Nausea.           PANTOPRAZOLE (PROTONIX) 40 MG TABLET     Take 1 Tab by  mouth Daily (before breakfast).       These Medications have  changed          No medications on file       Stop Taking          No medications on file           Dictation disclaimer:  Please note that this dictation was completed with Dragon, the computer voice recognition software.  Quite often unanticipated grammatical, syntax, homophones, and other  interpretive errors are inadvertently transcribed by the computer software.  Please disregard these errors.  Please excuse any errors that have escaped final proofreading.

## 2018-03-02 NOTE — ED Notes (Signed)
Pt ambulatory back to room from bathroom, slow steady gait

## 2018-03-02 NOTE — ED Notes (Signed)
Pt asleep on stretcher, resting quietly, in nad, updated on delay waiting for CT report, no needs at this time, will continue to monitor

## 2018-03-02 NOTE — ED Notes (Signed)
The pt arrived in the ER with a cc of Left sided abd pain. 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 She said she was told by Dr. Karleen Hampshire to come to the ER. The pt stated that she brought a stool sample with her.

## 2018-03-03 MED ORDER — OXYCODONE 5 MG TAB
5 mg | ORAL_TABLET | Freq: Four times a day (QID) | ORAL | 0 refills | Status: AC | PRN
Start: 2018-03-03 — End: 2018-03-05

## 2018-03-03 MED ORDER — HYDROCODONE-ACETAMINOPHEN 5 MG-325 MG TAB
5-325 mg | ORAL_TABLET | ORAL | 0 refills | Status: DC | PRN
Start: 2018-03-03 — End: 2018-03-02

## 2018-03-07 MED ORDER — MIDAZOLAM 1 MG/ML IJ SOLN
1 mg/mL | INTRAMUSCULAR | Status: DC | PRN
Start: 2018-03-07 — End: 2018-03-07
  Administered 2018-02-21: 14:00:00 via INTRAVENOUS

## 2018-03-07 NOTE — Addendum Note (Signed)
Addendum  created 03/07/18 1425 by Shela Nevin, CRNA    Intraprocedure Meds edited, Orders acknowledged in Narrator

## 2018-03-07 NOTE — Addendum Note (Signed)
Addendum         created 03/07/18 1425 by Shela Nevin, CRNA                   Intraprocedure Meds edited, Orders acknowledged in Narrator

## 2018-03-09 ENCOUNTER — Ambulatory Visit: Attending: Surgery | Primary: Family Medicine

## 2018-03-09 ENCOUNTER — Ambulatory Visit: Admit: 2018-03-09 | Discharge: 2018-03-09 | Payer: MEDICARE | Attending: Surgery | Primary: Family Medicine

## 2018-03-09 DIAGNOSIS — K912 Postsurgical malabsorption, not elsewhere classified: Secondary | ICD-10-CM

## 2018-03-09 NOTE — Progress Notes (Signed)
Cheryl Paul is a 67 y.o. female that presents today to follow up post  LGBP preformed on 02/21/2018. Pt says pain level is at a 0  on a scale from 1-10. Pt is drinking 50oz of fluid daily. Pt is currently eating eggs, broth, jello, yogurt cottage cheese. Pt says the amount of time spent exercising is walking 15 minutes daily.    Body mass index is 45.7 kg/m??.      1. Have you been to the ER, urgent care clinic since your last visit?  Hospitalized since your last visit?No    2. Have you seen or consulted any other health care providers outside of the [facility] since your last visit?  Include any pap smears or colon screening. No

## 2018-03-09 NOTE — Progress Notes (Signed)
Subjective:      Cheryl Paul is a 67 y.o. female black female who presents in follow-up status post laparoscopic gastric bypass February 21, 2018 noting expected decreasing incisional discomfort and otherwise without complaint.  Discomfort no longer requires analgesia utilization.  Tolerant of an compliant with the bariatric surgery diet as prescribed with appropriate early satiety no specific food intolerances or pathologic emesis.  Patient has not attempted high density carbohydrates.  Compliant multivitamin, calcium citrate, vitamin B1, and vitamin B12 supplementation.  Exercise regimen: Walking approximately 30 minutes daily  Comorbidities: Adult onset diabetes mellitus, reactive airway disease, stricture incontinence??? 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  Preoperative weight: 273.  Current weight: 2 and 58.  BMI: 46.  Estimated loss: 116.  Current loss: 15.  Goal weight: 160.  Percentage weight loss: 30% / 16 days        Weight Loss Metrics 03/09/2018 03/09/2018 03/02/2018 02/21/2018 02/18/2018 02/08/2018 02/08/2018   Pre op / Initial Wt 273 - - - - 273 -   Today's Wt - 258 lb 269 lb - 273 lb - 273 lb   BMI - 45.7 kg/m2 47.65 kg/m2 48.36 kg/m2 - - 48.36 kg/m2   Ideal Body Wt 131 - - - - 128 -   Excess Body Wt 142 - - - - 145 -   Goal Wt 160 - - - - 157 -   Wt loss to date 15 - - - - 0 -   % Wt Loss 0.13 - - - - 0 -   80% EBW 113.6 - - - - 116 -               Past Medical History:   Diagnosis Date   ??? Arthritis     knee, arms,    ??? Asthma    ??? Chronic obstructive pulmonary disease (HCC)    ??? Chronic pain    ??? Diabetes (HCC)    ??? H/O sinusitis    ??? Hypercholesterolemia    ??? Hypertension    ??? Sleep apnea     NOT USING    ??? Stress incontinence    ??? Toe fracture, left August 2015    4th toe       Past Surgical History:   Procedure Laterality Date   ??? ABDOMEN SURGERY PROC UNLISTED      4 surguries for blockages    ??? HX CARPAL TUNNEL RELEASE Bilateral    ??? HX CHOLECYSTECTOMY     ??? HX GYN     ??? HX HERNIA REPAIR      abdomen   ??? HX HYSTERECTOMY      tubes tied   ??? 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 HX ORTHOPAEDIC      catrpal tunnel right and left   ??? HX POLYPECTOMY  04/18/14       Current Outpatient Medications   Medication Sig Dispense Refill   ??? multivitamin (ONE A DAY) tablet Take 1 Tab by mouth daily.     ??? thiamine HCL (B-1) 100 mg tablet Take  by mouth daily.     ??? cyanocobalamin 1,000 mcg tablet Take 1,000 mcg by mouth daily.     ??? calcium carbonate (CALCIUM 300 PO) Take  by mouth.     ??? lisinopril (PRINIVIL, ZESTRIL) 40 mg  tablet Take 1 Tab by mouth daily. 30 Tab 0   ??? pantoprazole (PROTONIX) 40 mg tablet Take 1 Tab by mouth Daily (before breakfast). 30 Tab 0   ??? cholecalciferol, VITAMIN D3, (VITAMIN D3) 5,000 unit tab tablet Take 5,000 Units by mouth daily.     ??? albuterol (PROVENTIL HFA, VENTOLIN HFA, PROAIR HFA) 90 mcg/actuation inhaler Take 1-2 Puffs by inhalation every four (4) hours as needed for Wheezing. 1 Inhaler 0   ??? atorvastatin (LIPITOR) 20 mg tablet TAKE ONE TABLET BY MOUTH ONE TIME DAILY 90 Tab 2   ??? nystatin (MYCOSTATIN) topical cream Apply  to affected area two (2) times a day. 15 g 1   ??? alcohol swabs padm Use daily to test blood sugar  DX E11.9 100 Pad 1   ??? hyoscyamine sulfate (CYSTOSPAZ) 0.125 mg tablet 1 Tab by SubLINGual route every six (6) hours as needed for Other (Spasms). 17 Tab 0   ??? ondansetron (ZOFRAN ODT) 4 mg disintegrating tablet Take 1 Tab by mouth every eight (8) hours as needed for Nausea. 15 Tab 0       Allergies   Allergen Reactions   ??? Penicillins Itching   ??? Butrans [Buprenorphine] Rash   ??? Codeine Other (comments)     Nausea and abd pain   ??? Iodine Rash   ??? Lactose Diarrhea     Lactose intolerance         Objective:     Visit Vitals  BP 123/49 (BP 1 Location: Left arm, BP Patient Position: At rest)   Pulse 65   Temp 97.5 ??F (36.4 ??C) (Oral)   Resp 20   Ht 5\' 3"  (1.6 m)    Wt 117 kg (258 lb)   BMI 45.70 kg/m??       General:  Alert, oriented x3, nontoxic in appearance   Chest: Clear to auscultation without adventitious sounds with equal and symmetric excursions bilaterally   Cor: Regular rate and rhythm without rub gallop or murmur   Abdomen: Soft with normoactive bowel sounds and no masses, megaly, wounds clean dry approximated with appropriate surrounding induration and incisional tenderness without evidence of infectious complications or incisional hernia           Assessment:     History of clinically severe obesity status post laparoscopic gastric bypass February 21, 2018 with appropriate weight loss and comorbidity resolution    Plan:     Continue compliance with the bariatric surgery diet with advancement with the assistance of bariatric nutrition, continue vitamin supplementation and exercise protocol, encourage monthly attendance at the bariatric support group meetings.  Follow-up January 2020 with labs for 7-month bariatric surgical follow-up

## 2018-03-09 NOTE — Progress Notes (Signed)
Subjective:      Cheryl Paul is a 67 y.o. female black female who presents in follow-up status post laparoscopic gastric bypass February 21, 2018 noting expected decreasing incisional discomfort and otherwise without complaint.  Discomfort no longer requires analgesia utilization.  Tolerant of an compliant with the bariatric surgery diet as prescribed with appropriate early satiety no specific food intolerances or pathologic emesis.  Patient has not attempted high density carbohydrates.  Compliant multivitamin, calcium citrate, vitamin B1, and vitamin B12 supplementation.  Exercise regimen: Walking approximately 30 minutes daily  Comorbidities: Adult onset diabetes mellitus, reactive airway disease, stricture incontinence??? 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  Preoperative weight: 273.  Current weight: 2 and 58.  BMI: 46.  Estimated loss: 116.  Current loss: 15.  Goal weight: 160.  Percentage weight loss: 30% / 16 days        Weight Loss Metrics 03/09/2018 03/09/2018 03/02/2018 02/21/2018 02/18/2018 02/08/2018 02/08/2018   Pre op / Initial Wt 273 - - - - 273 -   Today's Wt - 258 lb 269 lb - 273 lb - 273 lb   BMI - 45.7 kg/m2 47.65 kg/m2 48.36 kg/m2 - - 48.36 kg/m2   Ideal Body Wt 131 - - - - 128 -   Excess Body Wt 142 - - - - 145 -   Goal Wt 160 - - - - 157 -   Wt loss to date 15 - - - - 0 -   % Wt Loss 0.13 - - - - 0 -   80% EBW 113.6 - - - - 116 -               Past Medical History:   Diagnosis Date   ??? Arthritis     knee, arms,    ??? Asthma    ??? Chronic obstructive pulmonary disease (HCC)    ??? Chronic pain    ??? Diabetes (HCC)    ??? H/O sinusitis    ??? Hypercholesterolemia    ??? Hypertension    ??? Sleep apnea     NOT USING    ??? Stress incontinence    ??? Toe fracture, left August 2015    4th toe       Past Surgical History:   Procedure Laterality Date   ??? ABDOMEN SURGERY PROC UNLISTED      4 surguries for blockages   ??? HX CARPAL TUNNEL RELEASE  Bilateral    ??? HX CHOLECYSTECTOMY     ??? HX GYN     ??? HX HERNIA REPAIR      abdomen   ??? HX HYSTERECTOMY      tubes tied   ??? 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 HX ORTHOPAEDIC      catrpal tunnel right and left   ??? HX POLYPECTOMY  04/18/14       Current Outpatient Medications   Medication Sig Dispense Refill   ??? multivitamin (ONE A DAY) tablet Take 1 Tab by mouth daily.     ??? thiamine HCL (B-1) 100 mg tablet Take  by mouth daily.     ??? cyanocobalamin 1,000 mcg tablet Take 1,000 mcg by mouth daily.     ??? calcium carbonate (CALCIUM 300 PO) Take  by mouth.     ??? lisinopril (PRINIVIL, ZESTRIL) 40 mg  tablet Take 1 Tab by mouth daily. 30 Tab 0   ??? pantoprazole (PROTONIX) 40 mg tablet Take 1 Tab by mouth Daily (before breakfast). 30 Tab 0   ??? cholecalciferol, VITAMIN D3, (VITAMIN D3) 5,000 unit tab tablet Take 5,000 Units by mouth daily.     ??? albuterol (PROVENTIL HFA, VENTOLIN HFA, PROAIR HFA) 90 mcg/actuation inhaler Take 1-2 Puffs by inhalation every four (4) hours as needed for Wheezing. 1 Inhaler 0   ??? atorvastatin (LIPITOR) 20 mg tablet TAKE ONE TABLET BY MOUTH ONE TIME DAILY 90 Tab 2   ??? nystatin (MYCOSTATIN) topical cream Apply  to affected area two (2) times a day. 15 g 1   ??? alcohol swabs padm Use daily to test blood sugar  DX E11.9 100 Pad 1   ??? hyoscyamine sulfate (CYSTOSPAZ) 0.125 mg tablet 1 Tab by SubLINGual route every six (6) hours as needed for Other (Spasms). 17 Tab 0   ??? ondansetron (ZOFRAN ODT) 4 mg disintegrating tablet Take 1 Tab by mouth every eight (8) hours as needed for Nausea. 15 Tab 0       Allergies   Allergen Reactions   ??? Penicillins Itching   ??? Butrans [Buprenorphine] Rash   ??? Codeine Other (comments)     Nausea and abd pain   ??? Iodine Rash   ??? Lactose Diarrhea     Lactose intolerance         Objective:     Visit Vitals  BP 123/49 (BP 1 Location: Left arm, BP Patient Position: At rest)   Pulse 65   Temp 97.5 ??F (36.4 ??C) (Oral)   Resp 20   Ht 5\' 3"  (1.6 m)   Wt 117 kg (258 lb)   BMI 45.70  kg/m??       General:  Alert, oriented x3, nontoxic in appearance   Chest: Clear to auscultation without adventitious sounds with equal and symmetric excursions bilaterally   Cor: Regular rate and rhythm without rub gallop or murmur   Abdomen: Soft with normoactive bowel sounds and no masses, megaly, wounds clean dry approximated with appropriate surrounding induration and incisional tenderness without evidence of infectious complications or incisional hernia           Assessment:     History of clinically severe obesity status post laparoscopic gastric bypass February 21, 2018 with appropriate weight loss and comorbidity resolution    Plan:     Continue compliance with the bariatric surgery diet with advancement with the assistance of bariatric nutrition, continue vitamin supplementation and exercise protocol, encourage monthly attendance at the bariatric support group meetings.  Follow-up January 2020 with labs for 8-month bariatric surgical follow-up

## 2018-03-09 NOTE — Progress Notes (Signed)
 Cheryl Paul is a 67 y.o. female that presents today to follow up post  LGBP preformed on 02/21/2018. Pt says pain level is at a 0  on a scale from 1-10. Pt is drinking 50oz of fluid daily. Pt is currently eating eggs, broth, jello, yogurt cottage cheese. Pt says the amount of time spent exercising is walking 15 minutes daily.    Body mass index is 45.7 kg/m.      1. Have you been to the ER, urgent care clinic since your last visit?  Hospitalized since your last visit?No    2. Have you seen or consulted any other health care providers outside of the [facility] since your last visit?  Include any pap smears or colon screening. No

## 2018-04-05 ENCOUNTER — Inpatient Hospital Stay: Admit: 2018-04-05 | Primary: Family Medicine

## 2018-04-05 NOTE — Progress Notes (Signed)
[place] [facility]  POST-OP NUTRITION FOLLOW UP    Patient's Name: Cheryl Paul  Date of Birth: [birth year]  Surgery Date: 02/21/18      Procedure: Gastric Bypass    Surgeon: Dr. Karleen HampshireSpencer    Height: 5 f 3     Pre-Op Weight: 273     Current Weight: 240  Weight Lost: 33    BMI:  42.6    Attendance of support group: Yes  When:   Why not:     Complications  Readmittance: None  Reoperations: None  Complications: None  IV Fluids: Yes  ER Trips: [facility].  She states she went with pain.    Problem Areas:   Nausea:  None  Vomiting: None   Dumping Syndrome: None  Inadequate Protein: Yes.  Patient states she hasn't been doing the protein shakes.    Inadequate Fluids: Yes.  She is drinking very little fluid.  Food Intolerance: None  Hunger: None  Constipation:  None    Eating 3 Meals/Day: No.  She states it has been too hard.  She states she is eating maybe 2 x a day.  Portion Size at Meals: 1-2 ounces     Protein from Food:     Foods being consumed:  Breakfast: Time:Gets up at 10.  She states she may have a scrambled egg or may have some cottage cheese.   She states she may have a half of container of [employer] yogurt.  She may do a protein drink at this point, but also misses.  Lunch: Time: 3:30 pm:   [place]ia.  She states she is able to eat full piece.    Dinner:  Time: She states she is often ready for bed at this point.     In-between eating: None    Length of time for meals: 20 minutes   Separating food/fluids: 30/30 minutes    Fluids: 12 oz/day   Types of Fluids: water and protein shake    MVI: Flinstones    Number/Day: bid   Taken Separately: yes    Vitamin B1: Yes  Dosing: 100 mg    Calcium: Citrate pills    Calcium Dosing: 1500 mg    Taken Separately: Yes    Vitamin B12: sublingual   Vitamin B12 Dosing: 1000 mcg    Vitamin D:      Vitamin D dosing: 5000 IU    Iron: n/a    Iron dosing:      Protein Supplement: Premier     Grams of Protein: 30   Mixed with:       Splitting Protein Drink in 1/2:    Timing of Protein Drinks:   Patient is taking 0 days a week.  She states she has had less than 1 full box since surgery.    Exercise: Walking down the hallway a few times a day.  She states she will do 10 laps per day.      Comments:    Patient is 6 weeks post op.  She is doing better with food, but her fluid intake is still very poor.  I have made suggestions to help her increase this.  She is also not taking her protein shakes.  Other examples were reviewed.  She is taking her vitamins.  I will follow up prn.    Patient was educated on the importance of eating meat and vegetables only.  I have talked with patient about the  effects of carbohydrates, not only from a weight management perspective, but also what effects it could have on their blood sugar and what reactive hypoglycemia is.        Diet Follow Up:  9 month class scheduled for: August 7    Adela Ports, [place] RD    04/05/2018

## 2018-04-05 NOTE — Progress Notes (Signed)
[place] [facility]  POST-OP NUTRITION FOLLOW UP    Patient's Name: Cheryl Paul  Date of Birth: 07/05/1950  Surgery Date: 02/21/18      Procedure: Gastric Bypass    Surgeon: Dr. Karleen HampshireSpencer    Height: 5 f 3     Pre-Op Weight: 273     Current Weight: 240  Weight Lost: 33    BMI:  42.6    Attendance of support group: Yes  When:   Why not:     Complications  Readmittance: None  Reoperations: None  Complications: None  IV Fluids: Yes  ER Trips: Patient went to [facility] on 10/16.  She states she went with pain.    Problem Areas:   Nausea:  None  Vomiting: None   Dumping Syndrome: None  Inadequate Protein: Yes.  Patient states she hasn't been doing the protein shakes.    Inadequate Fluids: Yes.  She is drinking very little fluid.  Food Intolerance: None  Hunger: None  Constipation:  None    Eating 3 Meals/Day: No.  She states it has been too hard.  She states she is eating maybe 2 x a day.  Portion Size at Meals: 1-2 ounces     Protein from Food:     Foods being consumed:  Breakfast: Time:Gets up at 10.  She states she may have a scrambled egg or may have some cottage cheese.   She states she may have a half of container of [employer] yogurt.  She may do a protein drink at this point, but also misses.  Lunch: Time: 3:30 pm:   [place]ia.  She states she is able to eat full piece.    Dinner:  Time: She states she is often ready for bed at this point.     In-between eating: None    Length of time for meals: 20 minutes   Separating food/fluids: 30/30 minutes    Fluids: 12 oz/day   Types of Fluids: water and protein shake    MVI: Flinstones    Number/Day: bid   Taken Separately: yes    Vitamin B1: Yes  Dosing: 100 mg    Calcium: Citrate pills    Calcium Dosing: 1500 mg    Taken Separately: Yes    Vitamin B12: sublingual   Vitamin B12 Dosing: 1000 mcg    Vitamin D:      Vitamin D dosing: 5000 IU    Iron: n/a    Iron dosing:      Protein Supplement: Premier     Grams of Protein: 30   Mixed with:      Splitting Protein Drink  in 1/2:    Timing of Protein Drinks:   Patient is taking 0 days a week.  She states she has had less than 1 full box since surgery.    Exercise: Walking down the hallway a few times a day.  She states she will do 10 laps per day.      Comments:    Patient is 6 weeks post op.  She is doing better with food, but her fluid intake is still very poor.  I have made suggestions to help her increase this.  She is also not taking her protein shakes.  Other examples were reviewed.  She is taking her vitamins.  I will follow up prn.    Patient was educated on the importance of eating meat and vegetables only.  I have talked with patient about the  effects of carbohydrates, not only from a weight management perspective, but also what effects it could have on their blood sugar and what reactive hypoglycemia is.        Diet Follow Up:  9 month class scheduled for: August 7    Adela Ports, [place] RD    04/05/2018

## 2018-05-15 ENCOUNTER — Emergency Department: Admit: 2018-05-15 | Payer: MEDICARE | Primary: Family Medicine

## 2018-05-15 ENCOUNTER — Inpatient Hospital Stay
Admit: 2018-05-15 | Discharge: 2018-05-22 | Disposition: A | Discharge status: Home / Self Care | Payer: MEDICARE | Attending: Family Medicine | Admitting: Family Medicine

## 2018-05-15 DIAGNOSIS — E876 Hypokalemia: Secondary | ICD-10-CM

## 2018-05-15 LAB — CBC WITH AUTO DIFFERENTIAL
Basophils %: 0 % (ref 0–2)
Basophils Absolute: 0 10*3/uL (ref 0.0–0.1)
Eosinophils %: 1 % (ref 0–5)
Eosinophils Absolute: 0.1 10*3/uL (ref 0.0–0.4)
Hematocrit: 37.5 % (ref 35.0–45.0)
Hemoglobin: 11.4 g/dL — ABNORMAL LOW (ref 12.0–16.0)
Lymphocytes %: 22 % (ref 21–52)
Lymphocytes Absolute: 2 10*3/uL (ref 0.9–3.6)
MCH: 26.1 PG (ref 24.0–34.0)
MCHC: 30.4 g/dL — ABNORMAL LOW (ref 31.0–37.0)
MCV: 85.8 FL (ref 74.0–97.0)
Monocytes %: 9 % (ref 3–10)
Monocytes Absolute: 0.8 10*3/uL (ref 0.05–1.2)
Neutrophils %: 68 % (ref 40–73)
Neutrophils Absolute: 6.1 10*3/uL (ref 1.8–8.0)
Platelet Comment: ADEQUATE
Platelets: 120 10*3/uL — ABNORMAL LOW (ref 135–420)
RBC: 4.37 M/uL (ref 4.20–5.30)
RDW: 15.8 % — ABNORMAL HIGH (ref 11.6–14.5)
WBC: 9 10*3/uL (ref 4.6–13.2)

## 2018-05-15 LAB — EKG 12-LEAD
Atrial Rate: 77 {beats}/min
Diagnosis: NORMAL
P Axis: 0 degrees
P-R Interval: 136 ms
Q-T Interval: 346 ms
QRS Duration: 86 ms
QTc Calculation (Bazett): 391 ms
R Axis: 6 degrees
T Axis: -42 degrees
Ventricular Rate: 77 {beats}/min

## 2018-05-15 LAB — CARDIAC PANEL
CK-MB Index: 0.1 % (ref 0.0–4.0)
CK-MB: 4.8 ng/ml — ABNORMAL HIGH (ref <3.6)
Total CK: 4146 U/L — ABNORMAL HIGH (ref 26–192)
Troponin I: 0.08 NG/ML — ABNORMAL HIGH (ref 0.0–0.045)

## 2018-05-15 LAB — COMPREHENSIVE METABOLIC PANEL
ALT: 68 U/L — ABNORMAL HIGH (ref 13–56)
AST: 91 U/L — ABNORMAL HIGH (ref 10–38)
Albumin/Globulin Ratio: 0.9 (ref 0.8–1.7)
Albumin: 2.9 g/dL — ABNORMAL LOW (ref 3.4–5.0)
Alkaline Phosphatase: 48 U/L (ref 45–117)
Anion Gap: 6 mmol/L (ref 3.0–18)
BUN: 7 MG/DL (ref 7.0–18)
Bun/Cre Ratio: 10 — ABNORMAL LOW (ref 12–20)
CO2: 35 mmol/L — ABNORMAL HIGH (ref 21–32)
Calcium: 7 MG/DL — ABNORMAL LOW (ref 8.5–10.1)
Chloride: 103 mmol/L (ref 100–111)
Creatinine: 0.72 MG/DL (ref 0.6–1.3)
EGFR IF NonAfrican American: >60 mL/min/{1.73_m2} (ref >60)
GFR African American: >60 mL/min/{1.73_m2} (ref >60)
Globulin: 3.3 g/dL (ref 2.0–4.0)
Glucose: 115 mg/dL — ABNORMAL HIGH (ref 74–99)
Potassium: 2.1 mmol/L — CL (ref 3.5–5.5)
Sodium: 144 mmol/L (ref 136–145)
Total Bilirubin: 0.8 MG/DL (ref 0.2–1.0)
Total Protein: 6.2 g/dL — ABNORMAL LOW (ref 6.4–8.2)

## 2018-05-15 LAB — BASIC METABOLIC PANEL
Anion Gap: 7 mmol/L (ref 3.0–18)
BUN: 5 MG/DL — ABNORMAL LOW (ref 7.0–18)
Bun/Cre Ratio: 8 — ABNORMAL LOW (ref 12–20)
CO2: 31 mmol/L (ref 21–32)
Calcium: 7.5 MG/DL — ABNORMAL LOW (ref 8.5–10.1)
Chloride: 111 mmol/L (ref 100–111)
Creatinine: 0.6 MG/DL (ref 0.6–1.3)
EGFR IF NonAfrican American: >60 mL/min/{1.73_m2} (ref >60)
GFR African American: >60 mL/min/{1.73_m2} (ref >60)
Glucose: 84 mg/dL (ref 74–99)
Potassium: 2.4 mmol/L — CL (ref 3.5–5.5)
Sodium: 149 mmol/L — ABNORMAL HIGH (ref 136–145)

## 2018-05-15 LAB — HEMOGLOBIN A1C W/O EAG
Hemoglobin A1C: 5.5 % (ref 4.2–5.6)
Hemoglobin A1c: 5.5 % (ref 4.2–5.6)

## 2018-05-15 LAB — URINALYSIS W/ RFLX MICROSCOPIC
Bilirubin, Urine: NEGATIVE
Bilirubin: NEGATIVE
Blood, Urine: NEGATIVE
Blood: NEGATIVE
Glucose, Ur: NEGATIVE mg/dL
Glucose: NEGATIVE mg/dL
Leukocyte Esterase, Urine: NEGATIVE
Leukocyte Esterase: NEGATIVE
Nitrite, Urine: NEGATIVE
Nitrites: NEGATIVE
Protein, UA: NEGATIVE mg/dL
Protein: NEGATIVE mg/dL
Specific Gravity, UA: 1.008 (ref 1.005–1.030)
Specific gravity: 1.008 (ref 1.005–1.030)
Urobilinogen, UA, POCT: 1 EU/dL (ref 0.2–1.0)
Urobilinogen: 1 EU/dL (ref 0.2–1.0)
pH (UA): 5.5 (ref 5.0–8.0)
pH, UA: 5.5 (ref 5.0–8.0)

## 2018-05-15 LAB — TROPONIN: Troponin I: 0.09 NG/ML — ABNORMAL HIGH (ref 0.0–0.045)

## 2018-05-15 LAB — MAGNESIUM
Magnesium: 1 mg/dL — ABNORMAL LOW (ref 1.6–2.6)
Magnesium: 1 mg/dL — ABNORMAL LOW (ref 1.6–2.6)

## 2018-05-15 LAB — TSH 3RD GENERATION
TSH: 3.33 u[IU]/mL (ref 0.36–3.74)
TSH: 3.33 u[IU]/mL (ref 0.36–3.74)

## 2018-05-15 LAB — CBC WITH AUTOMATED DIFF
ABS. BASOPHILS: 0 10*3/uL (ref 0.0–0.1)
ABS. EOSINOPHILS: 0.1 10*3/uL (ref 0.0–0.4)
ABS. LYMPHOCYTES: 2 10*3/uL (ref 0.9–3.6)
ABS. MONOCYTES: 0.8 10*3/uL (ref 0.05–1.2)
ABS. NEUTROPHILS: 6.1 10*3/uL (ref 1.8–8.0)
BASOPHILS: 0 % (ref 0–2)
EOSINOPHILS: 1 % (ref 0–5)
HCT: 37.5 % (ref 35.0–45.0)
HGB: 11.4 g/dL — ABNORMAL LOW (ref 12.0–16.0)
LYMPHOCYTES: 22 % (ref 21–52)
MCH: 26.1 PG (ref 24.0–34.0)
MCHC: 30.4 g/dL — ABNORMAL LOW (ref 31.0–37.0)
MCV: 85.8 FL (ref 74.0–97.0)
MONOCYTES: 9 % (ref 3–10)
NEUTROPHILS: 68 % (ref 40–73)
PLATELET COMMENTS: ADEQUATE
PLATELET: 120 10*3/uL — ABNORMAL LOW (ref 135–420)
RBC: 4.37 M/uL (ref 4.20–5.30)
RDW: 15.8 % — ABNORMAL HIGH (ref 11.6–14.5)
WBC: 9 10*3/uL (ref 4.6–13.2)

## 2018-05-15 LAB — METABOLIC PANEL, COMPREHENSIVE
A-G Ratio: 0.9 (ref 0.8–1.7)
ALT (SGPT): 68 U/L — ABNORMAL HIGH (ref 13–56)
AST (SGOT): 91 U/L — ABNORMAL HIGH (ref 10–38)
Albumin: 2.9 g/dL — ABNORMAL LOW (ref 3.4–5.0)
Alk. phosphatase: 48 U/L (ref 45–117)
Anion gap: 6 mmol/L (ref 3.0–18)
BUN/Creatinine ratio: 10 — ABNORMAL LOW (ref 12–20)
BUN: 7 MG/DL (ref 7.0–18)
Bilirubin, total: 0.8 MG/DL (ref 0.2–1.0)
CO2: 35 mmol/L — ABNORMAL HIGH (ref 21–32)
Calcium: 7 MG/DL — ABNORMAL LOW (ref 8.5–10.1)
Chloride: 103 mmol/L (ref 100–111)
Creatinine: 0.72 MG/DL (ref 0.6–1.3)
GFR est AA: >60 mL/min/{1.73_m2} (ref >60)
GFR est non-AA: >60 mL/min/{1.73_m2} (ref >60)
Globulin: 3.3 g/dL (ref 2.0–4.0)
Glucose: 115 mg/dL — ABNORMAL HIGH (ref 74–99)
Potassium: 2.1 mmol/L — CL (ref 3.5–5.5)
Protein, total: 6.2 g/dL — ABNORMAL LOW (ref 6.4–8.2)
Sodium: 144 mmol/L (ref 136–145)

## 2018-05-15 LAB — EKG, 12 LEAD, INITIAL
Atrial Rate: 77 {beats}/min
Calculated P Axis: 0 degrees
Calculated R Axis: 6 degrees
Calculated T Axis: -42 degrees
Diagnosis: NORMAL
P-R Interval: 136 ms
Q-T Interval: 346 ms
QRS Duration: 86 ms
QTC Calculation (Bezet): 391 ms
Ventricular Rate: 77 {beats}/min

## 2018-05-15 LAB — METABOLIC PANEL, BASIC
Anion gap: 7 mmol/L (ref 3.0–18)
BUN/Creatinine ratio: 8 — ABNORMAL LOW (ref 12–20)
BUN: 5 MG/DL — ABNORMAL LOW (ref 7.0–18)
CO2: 31 mmol/L (ref 21–32)
Calcium: 7.5 MG/DL — ABNORMAL LOW (ref 8.5–10.1)
Chloride: 111 mmol/L (ref 100–111)
Creatinine: 0.6 MG/DL (ref 0.6–1.3)
GFR est AA: >60 mL/min/{1.73_m2} (ref >60)
GFR est non-AA: >60 mL/min/{1.73_m2} (ref >60)
Glucose: 84 mg/dL (ref 74–99)
Potassium: 2.4 mmol/L — CL (ref 3.5–5.5)
Sodium: 149 mmol/L — ABNORMAL HIGH (ref 136–145)

## 2018-05-15 LAB — CARDIAC PANEL,(CK, CKMB & TROPONIN)
CK - MB: 4.8 ng/ml — ABNORMAL HIGH (ref <3.6)
CK-MB Index: 0.1 % (ref 0.0–4.0)
CK: 4146 U/L — ABNORMAL HIGH (ref 26–192)
Troponin-I, QT: 0.08 NG/ML — ABNORMAL HIGH (ref 0.0–0.045)

## 2018-05-15 LAB — TROPONIN I: Troponin-I, QT: 0.09 NG/ML — ABNORMAL HIGH (ref 0.0–0.045)

## 2018-05-15 MED ORDER — ASPIRIN 81 MG CHEWABLE TAB
81 mg | ORAL | Status: AC
Start: 2018-05-15 — End: 2018-05-15
  Administered 2018-05-15: 09:00:00 via ORAL

## 2018-05-15 MED ORDER — LISINOPRIL 40 MG TAB
40 mg | Freq: Every day | ORAL | Status: DC
Start: 2018-05-15 — End: 2018-05-22
  Administered 2018-05-15 – 2018-05-22 (×8): via ORAL

## 2018-05-15 MED ORDER — CHOLECALCIFEROL (VITAMIN D3) 5,000 UNIT CAP
Freq: Every day | ORAL | Status: DC
Start: 2018-05-15 — End: 2018-05-22
  Administered 2018-05-15 – 2018-05-22 (×8): via ORAL

## 2018-05-15 MED ORDER — SODIUM CHLORIDE 0.9 % IV
Freq: Once | INTRAVENOUS | Status: AC
Start: 2018-05-15 — End: 2018-05-15
  Administered 2018-05-15: 11:00:00 via INTRAVENOUS

## 2018-05-15 MED ORDER — THERAPEUTIC MULTIVITAMIN TAB
Freq: Every day | ORAL | Status: DC
Start: 2018-05-15 — End: 2018-05-17
  Administered 2018-05-15 – 2018-05-17 (×3): via ORAL

## 2018-05-15 MED ORDER — PANTOPRAZOLE 40 MG TAB, DELAYED RELEASE
40 mg | Freq: Every day | ORAL | Status: DC
Start: 2018-05-15 — End: 2018-05-22
  Administered 2018-05-15 – 2018-05-22 (×8): via ORAL

## 2018-05-15 MED ORDER — NS WITH POTASSIUM CHLORIDE 20 MEQ/L IV
20 mEq/L | INTRAVENOUS | Status: DC
Start: 2018-05-15 — End: 2018-05-15
  Administered 2018-05-15: 13:00:00 via INTRAVENOUS

## 2018-05-15 MED ORDER — DOCUSATE SODIUM 100 MG CAP
100 mg | Freq: Two times a day (BID) | ORAL | Status: DC | PRN
Start: 2018-05-15 — End: 2018-05-22

## 2018-05-15 MED ORDER — CALCIUM CHLORIDE 10 % (100 MG/ML) IV SYRINGE
100 mg/mL (10 %) | Freq: Once | INTRAVENOUS | Status: AC
Start: 2018-05-15 — End: 2018-05-15
  Administered 2018-05-15: 13:00:00 via INTRAVENOUS

## 2018-05-15 MED ORDER — FLU VACCINE QV 2019-20 (6 MOS+)(PF) 60 MCG (15 MCG X 4)/0.5 ML IM SYRINGE
60 mcg (15 mcg x 4)/0.5 mL | INTRAMUSCULAR | Status: DC
Start: 2018-05-15 — End: 2018-05-19

## 2018-05-15 MED ORDER — ALBUMIN, HUMAN 25 % IV
25 % | Freq: Once | INTRAVENOUS | Status: AC
Start: 2018-05-15 — End: 2018-05-15
  Administered 2018-05-15: 18:00:00 via INTRAVENOUS

## 2018-05-15 MED ORDER — ENOXAPARIN 80 MG/0.8 ML SUB-Q SYRINGE
80 mg/0.8 mL | Freq: Two times a day (BID) | SUBCUTANEOUS | Status: DC
Start: 2018-05-15 — End: 2018-05-15
  Administered 2018-05-15: 21:00:00 via SUBCUTANEOUS

## 2018-05-15 MED ORDER — POTASSIUM CHLORIDE 2 MEQ/ML IV SOLN
2 mEq/mL | INTRAVENOUS | Status: AC
Start: 2018-05-15 — End: 2018-05-15
  Administered 2018-05-15 (×6): via INTRAVENOUS

## 2018-05-15 MED ORDER — CALCIUM CHLORIDE 10 % (100 MG/ML) IV SYRINGE
100 mg/mL (10 %) | Freq: Once | INTRAVENOUS | Status: DC
Start: 2018-05-15 — End: 2018-05-15

## 2018-05-15 MED ORDER — CALCIUM GLUCONATE 100 MG/ML (10%) IV SOLN
100 mg/mL (10%) | Freq: Once | INTRAVENOUS | Status: AC
Start: 2018-05-15 — End: 2018-05-15

## 2018-05-15 MED ORDER — THIAMINE HCL 100 MG TAB
100 mg | Freq: Every day | ORAL | Status: DC
Start: 2018-05-15 — End: 2018-05-22
  Administered 2018-05-15 – 2018-05-22 (×8): via ORAL

## 2018-05-15 MED ORDER — MAGNESIUM SULFATE 4 GRAM/100 ML IV PIGGY BACK
4 gram/100 mL ( %) | INTRAVENOUS | Status: AC
Start: 2018-05-15 — End: 2018-05-15
  Administered 2018-05-15: 09:00:00 via INTRAVENOUS

## 2018-05-15 MED ORDER — ONDANSETRON (PF) 4 MG/2 ML INJECTION
4 mg/2 mL | Freq: Four times a day (QID) | INTRAMUSCULAR | Status: DC | PRN
Start: 2018-05-15 — End: 2018-05-22

## 2018-05-15 MED ORDER — NALOXONE 0.4 MG/ML INJECTION
0.4 mg/mL | INTRAMUSCULAR | Status: DC | PRN
Start: 2018-05-15 — End: 2018-05-22

## 2018-05-15 MED ORDER — ENOXAPARIN 40 MG/0.4 ML SUB-Q SYRINGE
40 mg/0.4 mL | SUBCUTANEOUS | Status: DC
Start: 2018-05-15 — End: 2018-05-15

## 2018-05-15 MED ORDER — POTASSIUM CHLORIDE 10 MEQ/100 ML IV PIGGY BACK
10 mEq/0 mL | INTRAVENOUS | Status: AC
Start: 2018-05-15 — End: 2018-05-15
  Administered 2018-05-15 (×4): via INTRAVENOUS

## 2018-05-15 MED ORDER — CYANOCOBALAMIN 1,000 MCG TAB
1000 mcg | Freq: Every day | ORAL | Status: DC
Start: 2018-05-15 — End: 2018-05-22
  Administered 2018-05-15 – 2018-05-22 (×8): via ORAL

## 2018-05-15 MED ORDER — ACETAMINOPHEN 325 MG TABLET
325 mg | Freq: Four times a day (QID) | ORAL | Status: DC | PRN
Start: 2018-05-15 — End: 2018-05-22
  Administered 2018-05-18: 17:00:00 via ORAL

## 2018-05-15 MED FILL — LOVENOX 80 MG/0.8 ML SUBCUTANEOUS SYRINGE: 80 mg/0.8 mL | SUBCUTANEOUS | Qty: 0.8

## 2018-05-15 MED FILL — POTASSIUM CHLORIDE 10 MEQ/100 ML IV PIGGY BACK: 10 mEq/0 mL | INTRAVENOUS | Qty: 100

## 2018-05-15 MED FILL — LISINOPRIL 20 MG TAB: 20 mg | ORAL | Qty: 2

## 2018-05-15 MED FILL — PANTOPRAZOLE 40 MG TAB, DELAYED RELEASE: 40 mg | ORAL | Qty: 1

## 2018-05-15 MED FILL — ALBURX (HUMAN) 25 % INTRAVENOUS SOLUTION: 25 % | INTRAVENOUS | Qty: 100

## 2018-05-15 MED FILL — THERAPEUTIC MULTIVITAMIN TAB: ORAL | Qty: 1

## 2018-05-15 MED FILL — CALCIUM CHLORIDE 10 % (100 MG/ML) IV SYRINGE: 100 mg/mL (10 %) | INTRAVENOUS | Qty: 10

## 2018-05-15 MED FILL — POTASSIUM CHLORIDE 2 MEQ/ML IV SOLN: 2 mEq/mL | INTRAVENOUS | Qty: 5

## 2018-05-15 MED FILL — THIAMINE HCL 100 MG TAB: 100 mg | ORAL | Qty: 1

## 2018-05-15 MED FILL — MAGNESIUM SULFATE 4 GRAM/100 ML IV PIGGY BACK: 4 gram/100 mL ( %) | INTRAVENOUS | Qty: 100

## 2018-05-15 MED FILL — SODIUM CHLORIDE 0.9 % IV: INTRAVENOUS | Qty: 1000

## 2018-05-15 MED FILL — NS WITH POTASSIUM CHLORIDE 20 MEQ/L IV: 20 mEq/L | INTRAVENOUS | Qty: 1000

## 2018-05-15 MED FILL — CHOLECALCIFEROL (VITAMIN D3) 5,000 UNIT CAP: ORAL | Qty: 1

## 2018-05-15 MED FILL — ASPIRIN 81 MG CHEWABLE TAB: 81 mg | ORAL | Qty: 4

## 2018-05-15 MED FILL — CYANOCOBALAMIN 1,000 MCG TAB: 1000 mcg | ORAL | Qty: 1

## 2018-05-15 NOTE — Progress Notes (Signed)
[facility]  Progress Note    Patient: Cheryl Paul Age: 67 y.o. DOB: 05-13-1951 MR#: [id number] SSN: [id number]  Date/Time: 05/15/2018     Subjective:     Review of systems    Patient is comfortable, alert awake oriented,   Patient is complaining of leg cramps and calf tenderness  No CP   NO NVD  No SOB  NO Cough     Assessment/Plan:   Morbidly obese female, history of gastric bypass surgery, was in her usual state of health On Tuesday, December 24, following that she experience increasing shortness of breath [place] also she experienced leg cramps calf tenderness, no chest pain no nausea vomiting diarrhea.  This admission she is here for syncope and electrolyte abnormalities    1.  Syncope  -Likely vasovagal  -Multiple electrolytes abnormalities  -Rule out PE rule out DVT-she has allergy to iodine-I will get ultrasound of lower leg and VQ scan meanwhile continue treatment dose of Lovenox      2 severe hypokalemia -replacement on IV and monitor    3 severe hypo-Magness anemia on replacement IV and monitor    4 morbid obesity-status post gastric bypass surgery-currently on nutritional supplements    5 diabetes mellitus type 2 -not on any medications    6 anemia-of chronic illness-rule out deficiency disorder-follow iron profile and B12 level    Case discussed with:  [x] Patient  [x]  Family ( In room with patient )    [x] Nursing  [] Case Management  DVT Prophylaxis:  [x] Lovenox  [] Hep SQ  [] SCDs  [] Coumadin   [] On Heparin gtt          Objective:   VS:   Visit Vitals  BP 122/64   Pulse 77   Temp 98.4 ??F (36.9 ??C)   Resp 19   SpO2 100%      Tmax/24hrs: Temp (24hrs), Avg:98.4 ??F (36.9 ??C), Min:98.4 ??F (36.9 ??C), Max:98.4 ??F (36.9 ??C)  IOBRIEFNo intake or output data in the 24 hours ending 05/15/18 1403    General:  Alert, cooperative, no acute distress   Cardiovascular: S1S2 - regular , No Murmur    Pulmonary: Equal expansion , No Use of accessory muscles , No Rales No Rhonchi    GI:  +BS in all four quadrants, soft, non-tender  Extremities:  No edema; 2+ dorsalis pedis pulses bilaterally//calf edema   Neuro: Alert and oriented X 2.       Medications:   Current Facility-Administered Medications   Medication Dose Route Frequency   ??? calcium gluconate injection 1 g  1 g IntraVENous ONCE   ??? cholecalciferol (VITAMIN D3) capsule 5,000 Units  5,000 Units Oral DAILY   ??? cyanocobalamin tablet 1,000 mcg  1,000 mcg Oral DAILY   ??? lisinopril (PRINIVIL, ZESTRIL) tablet 40 mg  40 mg Oral DAILY   ??? pantoprazole (PROTONIX) tablet 40 mg  40 mg Oral ACB   ??? thiamine HCL (B-1) tablet 100 mg  100 mg Oral DAILY   ??? therapeutic multivitamin (THERAGRAN) tablet 1 Tab  1 Tab Oral DAILY   ??? acetaminophen (TYLENOL) tablet 650 mg  650 mg Oral Q6H PRN   ??? naloxone (NARCAN) injection 0.4 mg  0.4 mg IntraVENous PRN   ??? ondansetron (ZOFRAN) injection 4 mg  4 mg IntraVENous Q6H PRN   ??? docusate sodium (COLACE) capsule 100 mg  100 mg Oral BID PRN   ??? 0.9% sodium chloride with KCl 20  mEq/L infusion   IntraVENous CONTINUOUS   ??? potassium chloride 10 mEq in 100 ml IVPB  10 mEq IntraVENous Q1H   ??? enoxaparin (LOVENOX) injection 80 mg  80 mg SubCUTAneous Q12H     Current Outpatient Medications   Medication Sig   ??? aspirin 81 mg chewable tablet Take 81 mg by mouth daily.   ??? multivitamin (ONE A DAY) tablet Take 1 Tab by mouth daily.   ??? thiamine HCL (B-1) 100 mg tablet Take  by mouth daily.   ??? cyanocobalamin 1,000 mcg tablet Take 1,000 mcg by mouth daily.   ??? calcium carbonate (CALCIUM 300 PO) Take  by mouth.   ??? hyoscyamine sulfate (CYSTOSPAZ) 0.125 mg tablet 1 Tab by SubLINGual route every six (6) hours as needed for Other (Spasms).   ??? lisinopril (PRINIVIL, ZESTRIL) 40 mg tablet Take 1 Tab by mouth daily.   ??? pantoprazole (PROTONIX) 40 mg tablet Take 1 Tab by mouth Daily (before breakfast).    ??? ondansetron (ZOFRAN ODT) 4 mg disintegrating tablet Take 1 Tab by mouth every eight (8) hours as needed for Nausea.   ??? cholecalciferol, VITAMIN D3, (VITAMIN D3) 5,000 unit tab tablet Take 5,000 Units by mouth daily.   ??? albuterol (PROVENTIL HFA, VENTOLIN HFA, PROAIR HFA) 90 mcg/actuation inhaler Take 1-2 Puffs by inhalation every four (4) hours as needed for Wheezing.   ??? atorvastatin (LIPITOR) 20 mg tablet TAKE ONE TABLET BY MOUTH ONE TIME DAILY   ??? nystatin (MYCOSTATIN) topical cream Apply  to affected area two (2) times a day.   ??? alcohol swabs padm Use daily to test blood sugar  DX E11.9       Labs:    Recent Labs     05/15/18  0223   WBC 9.0   HGB 11.4*   HCT 37.5   PLT 120*     Recent Labs     05/15/18  1241 05/15/18  0223   NA 149* 144   K 2.4* 2.1*   CL 111 103   CO2 31 35*   GLU 84 115*   BUN 5* 7   CREA 0.60 0.72   CA 7.5* 7.0*   MG  --  1.0*   ALB  --  2.9*   SGOT  --  91*   ALT  --  68*         Signed By: Wanda Plumpirghayu Mecca Barga, MD     May 15, 2018

## 2018-05-15 NOTE — H&P (Signed)
History & Physical    Patient: Cheryl Paul MRN: [id number]  CSN: [id number]    Date of Birth: [birth year]  Age: 67 y.o.  Sex: female      DOA: 05/15/2018    Chief Complaint: No chief complaint on file.         HPI:     Cheryl Paul is a 67 y.o. African American female who has PMH of severe obesity status post laparoscopic gastric bypass surgery 02/2018, hypertension, diabetes, sleep apnea and stress incontinence  Presented to ER status post syncopal episode at home for unknown period of time(?15-20 min).  Patient states that she did not feel well for the last 2 days, generally weak with lower and upper extremities pain, claudications/spasms, patient states that she felt dizzy sat on the couch and cannot remember exactly what happened after that she was waking up by EMS and brought to ER.  Patient also complains of multiple diarrheal episodes during the course of the day especially after surgery and inability to tolerate big meals with early satiety since her surgery.  Patient states that she is not allowed to have fluids as per her nutritionist and currently on proteins and vegetables diet plus multivitamins including vitamin D, B12 and folate supplements  In ER patient was found to have multiple electrolytes abnormalities including very low magnesium of 1, low potassium of 2.1, low albumin of 2.9 and low calcium of 7.5 corrected   Patient in general states that she feels much better since she came in and started on IV fluids  Will be admitted for multiple electrolyte correction  Of note, has mild elevation of troponin without chest pain or EKG changes    Past Medical History:   Diagnosis Date   ??? Arthritis     knee, arms,    ??? Asthma    ??? Chronic obstructive pulmonary disease (HCC)    ??? Chronic pain    ??? Diabetes (HCC)    ??? H/O sinusitis    ??? Hypercholesterolemia    ??? Hypertension    ??? Sleep apnea     NOT USING    ??? Stress incontinence    ??? Toe fracture, left August 2015    4th toe        Past Surgical History:   Procedure Laterality Date   ??? ABDOMEN SURGERY PROC UNLISTED      4 surguries for blockages   ??? HX CARPAL TUNNEL RELEASE Bilateral    ??? HX CHOLECYSTECTOMY     ??? HX GYN     ??? HX HERNIA REPAIR      abdomen   ??? HX HYSTERECTOMY      tubes tied   ??? 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 HX ORTHOPAEDIC      catrpal tunnel right and left   ??? HX POLYPECTOMY  04/18/14       Family History   Problem Relation Age of Onset   ??? Hypertension Mother    ??? Stroke Mother    ??? Diabetes Mother    ??? Thyroid Disease Mother    ??? Arthritis-rheumatoid Mother    ??? Cancer Father         colon polpe   ??? Cancer Maternal Aunt        Social History     Socioeconomic History   ??? Marital status: SINGLE     Spouse name: Not on file   ??? Number of children: Not on file   ???  Years of education: Not on file   ??? Highest education level: Not on file   Tobacco Use   ??? Smoking status: Never Smoker   ??? Smokeless tobacco: Never Used   Substance and Sexual Activity   ??? Alcohol use: No     Alcohol/week: 0.0 standard drinks   ??? Drug use: No   ??? 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 Denies history of smoking        Prior to Admission medications    Medication Sig Start Date End Date Taking? Authorizing Provider   multivitamin (ONE A DAY) tablet Take 1 Tab by mouth daily.    Provider, Historical   thiamine HCL (B-1) 100 mg tablet Take  by mouth daily.    Provider, Historical   cyanocobalamin 1,000 mcg tablet Take 1,000 mcg by mouth daily.    Provider, Historical   calcium carbonate (CALCIUM 300 PO) Take  by mouth.    Provider, Historical   hyoscyamine sulfate (CYSTOSPAZ) 0.125 mg tablet 1 Tab by SubLINGual route every six (6) hours as needed for Other (Spasms). 02/23/18   Meibers, Debbora LacrosseKristi N, NP   lisinopril (PRINIVIL, ZESTRIL) 40 mg tablet Take 1 Tab by mouth daily. 02/23/18   Meibers, Debbora LacrosseKristi N, NP    pantoprazole (PROTONIX) 40 mg tablet Take 1 Tab by mouth Daily (before breakfast). 02/23/18   Meibers, Debbora LacrosseKristi N, NP   ondansetron (ZOFRAN ODT) 4 mg disintegrating tablet Take 1 Tab by mouth every eight (8) hours as needed for Nausea. 02/22/18   Estevan RyderAnderson, Talisha D, NP   cholecalciferol, VITAMIN D3, (VITAMIN D3) 5,000 unit tab tablet Take 5,000 Units by mouth daily.    Provider, Historical   albuterol (PROVENTIL HFA, VENTOLIN HFA, PROAIR HFA) 90 mcg/actuation inhaler Take 1-2 Puffs by inhalation every four (4) hours as needed for Wheezing. 05/24/17   Darryl LentJohnson, Jeffrey D, MD   atorvastatin (LIPITOR) 20 mg tablet TAKE ONE TABLET BY MOUTH ONE TIME DAILY 07/18/15   Juanetta GoslingHawkins, Osborne Cascoindee J, PA-C   nystatin (MYCOSTATIN) topical cream Apply  to affected area two (2) times a day. 07/17/15   Langston MaskerWright, Teresa R, DO   alcohol swabs padm Use daily to test blood sugar  DX E11.9 07/17/15   Langston MaskerWright, Teresa R, DO       Allergies   Allergen Reactions   ??? Penicillins Itching   ??? Butrans [Buprenorphine] Rash   ??? Codeine Other (comments)     Nausea and abd pain   ??? Iodine Rash   ??? Lactose Diarrhea     Lactose intolerance         Review of Systems  GENERAL: Patient alert, awake and oriented times 3, able to communicate full sentences and not in distress.   HEENT: No change in vision, no earache, tinnitus, sore throat or sinus congestion.   NECK: No pain or stiffness.   PULMONARY: No shortness of breath, cough or wheeze.   Cardiovascular: no pnd / orthopnea, no CP  GASTROINTESTINAL: No abdominal pain, +nausea, no vomiting + diarrhea, no melena or bright red blood per rectum.   GENITOURINARY: No urinary frequency, urgency, hesitancy or dysuria.   MUSCULOSKELETAL: No joint or muscle pain, no back pain, no recent trauma.   DERMATOLOGIC: No rash, no itching, no lesions.   ENDOCRINE: No polyuria, polydipsia, no heat or cold intolerance. No recent change in weight.   HEMATOLOGICAL: No anemia or easy bruising or bleeding.  NEUROLOGIC: No headache, seizures, numbness, tingling or weakness.       Physical Exam:     Physical Exam:  Visit Vitals  BP 126/77 (BP 1 Location: Left arm)   Pulse 75   Temp 98.4 ??F (36.9 ??C)   Resp 18   SpO2 98%      O2 Device: Room air    Temp (24hrs), Avg:98.4 ??F (36.9 ??C), Min:98.4 ??F (36.9 ??C), Max:98.4 ??F (36.9 ??C)    No intake/output data recorded.   No intake/output data recorded.    General:  Alert, cooperative, no distress, appears stated age.              Head: Normocephalic, without obvious abnormality, atraumatic.   Eyes:  Conjunctivae/corneas clear. PERRL, EOMs intact.   Nose: Nares normal. No drainage or sinus tenderness.   Neck: Supple, symmetrical, trachea midline, no adenopathy, thyroid: no enlargement, no carotid bruit and no JVD.   Lungs:   Clear to auscultation bilaterally.   Heart:  Regular rate and rhythm, S1, S2 normal.     Abdomen: Soft, non-tender. Bowel sounds normal.    Extremities: Extremities normal, atraumatic, no cyanosis or edema.  Positive for spasms   Pulses: 2+ and symmetric all extremities.   Skin:  No rashes or lesions   Neurologic: AAOx3, No focal motor or sensory deficit.       Labs Reviewed:    Lab results reviewed. For significant abnormal values and values requiring intervention, see assessment and plan.  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  3.hypomagnesemia  4.hypoalbuminemia    Hypertension   Diabetes mellitus but currently not on any meds since her surgery    Electrolytes abnormalities mostly secondary to short gut syndrome status post gastric bypass surgery  Patient has lost about 60 pounds since then    We will replace electrolytes as needed  Calcium gluconate, magnesium, potassium IV  IV fluids  Will need nutrition consult  We will get B12, folate and TSH levels  Series of cardiac enzymes  Echo  Cardiology consult if troponin trends up  Continue lisinopril  We will get A1c      DVT/GI Prophylaxis: Lovenox    Plan of care is discussed in details with Patient/Family at bedside and agreed upon    Roe CoombsGamal S Itzell Bendavid, MD  05/15/2018 4:14 AM

## 2018-05-15 NOTE — ED Notes (Signed)
Back from CT scan.

## 2018-05-15 NOTE — ED Notes (Signed)
Patient for CT Chest X-ray complete.

## 2018-05-15 NOTE — ED Notes (Signed)
Daughter at bedside.

## 2018-05-15 NOTE — Other (Signed)
Verbal bedside shift report given to Evangeline GulaKaren RN on coming nurse by Juanetta Beetsamara J. Straka, RN off going nurse including SBAR, [facility] and [facility]

## 2018-05-15 NOTE — ED Provider Notes (Signed)
EMERGENCY DEPARTMENT HISTORY AND PHYSICAL EXAM    3:41 AM  Date: 05/15/2018  Patient Name: Cheryl Paul    History of Presenting Illness     No chief complaint on file.       History Provided By: Patient    HPI: Cheryl Paul is a 67 y.o. female with multiple medical problems as below status post gastric bypass.  Patient is presenting after syncopal event.  She felt lightheaded then sat down and put her head on a table and then the next thing she knew was EMS picking her up off of the floor.  Patient does remember the events or how long she was out for.  She is reporting heaviness in both lower extremities.  Denies any chest pain or shortness of breath.  No history of headache, nausea, vomiting or diarrhea.  No urinary symptoms.    Location:  Severity:  Timing/course:   Onset/Duration:     PCP: Gwyneth Revels, MD    Past History     Past Medical History:  Past Medical History:   Diagnosis Date   ??? Arthritis     knee, arms,    ??? Asthma    ??? Chronic obstructive pulmonary disease ([place])    ??? Chronic pain    ??? Diabetes ([place])    ??? H/O sinusitis    ??? Hypercholesterolemia    ??? Hypertension    ??? Sleep apnea     NOT USING    ??? Stress incontinence    ??? Toe fracture, left August 2015    4th toe       Past Surgical History:  Past Surgical History:   Procedure Laterality Date   ??? ABDOMEN SURGERY PROC UNLISTED      4 surguries for blockages   ??? HX CARPAL TUNNEL RELEASE Bilateral    ??? HX CHOLECYSTECTOMY     ??? HX GYN     ??? HX HERNIA REPAIR      abdomen   ??? HX HYSTERECTOMY      tubes tied   ??? 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 HX ORTHOPAEDIC      catrpal tunnel right and left   ??? HX POLYPECTOMY  04/18/14       Family History:  Family History   Problem Relation Age of Onset   ??? Hypertension Mother    ??? Stroke Mother    ??? Diabetes Mother    ??? Thyroid Disease Mother    ??? Arthritis-rheumatoid Mother    ??? Cancer Father         colon polpe   ??? Cancer Maternal Aunt        Social History:  Social History     Tobacco Use    ??? Smoking status: Never Smoker   ??? Smokeless tobacco: Never Used   Substance Use Topics   ??? Alcohol use: No     Alcohol/week: 0.0 standard drinks   ??? Drug use: No       Allergies:  Allergies   Allergen Reactions   ??? Penicillins Itching   ??? Butrans [Buprenorphine] Rash   ??? Codeine Other (comments)     Nausea and abd pain   ??? Iodine Rash   ??? Lactose Diarrhea     Lactose intolerance       Review of Systems   Review of Systems   Neurological: Positive for syncope and weakness.   All other systems reviewed and are negative.  Physical Exam     Patient Vitals for the past 12 hrs:   Temp Pulse Resp BP SpO2   05/15/18 0212 98.4 ??F (36.9 ??C) 75 18 126/77 98 %       Physical Exam  Vitals signs and nursing note reviewed.   Constitutional:       Appearance: Normal appearance.   HENT:      Head: Normocephalic and atraumatic.      Nose: Nose normal.      Mouth/Throat:      Mouth: Mucous membranes are moist.   Eyes:      Extraocular Movements: Extraocular movements intact.   Neck:      Musculoskeletal: Neck supple.   Cardiovascular:      Rate and Rhythm: Normal rate.   Pulmonary:      Effort: Pulmonary effort is normal.      Breath sounds: Normal breath sounds.   Musculoskeletal:         General: No deformity.   Skin:     General: Skin is warm and dry.   Neurological:      Mental Status: She is alert and oriented to person, place, and time.      Comments: Unable to elevate both lower extremities but can push against resistance.   Psychiatric:         Mood and Affect: Mood normal.         Behavior: Behavior normal.         Diagnostic Study Results     Labs -  Recent Results (from the past 12 hour(s))   EKG, 12 LEAD, INITIAL    Collection Time: 05/15/18  2:19 AM   Result Value Ref Range    Ventricular Rate 77 BPM    Atrial Rate 77 BPM    P-R Interval 136 ms    QRS Duration 86 ms    Q-T Interval 346 ms    QTC Calculation (Bezet) 391 ms    Calculated P Axis 0 degrees    Calculated R Axis 6 degrees     Calculated T Axis -42 degrees    Diagnosis       Normal sinus rhythm with sinus arrhythmia  Nonspecific ST and T wave abnormality  Abnormal ECG  When compared with ECG of 01-Dec-2017 08:18,  Nonspecific T wave abnormality now evident in Inferior leads     CBC WITH AUTOMATED DIFF    Collection Time: 05/15/18  2:23 AM   Result Value Ref Range    WBC 9.0 4.6 - 13.2 K/uL    RBC 4.37 4.20 - 5.30 M/uL    HGB 11.4 (L) 12.0 - 16.0 g/dL    HCT 37.5 35.0 - 45.0 %    MCV 85.8 74.0 - 97.0 FL    MCH 26.1 24.0 - 34.0 PG    MCHC 30.4 (L) 31.0 - 37.0 g/dL    RDW 15.8 (H) 11.6 - 14.5 %    PLATELET 120 (L) 135 - 420 K/uL    NEUTROPHILS 68 40 - 73 %    LYMPHOCYTES 22 21 - 52 %    MONOCYTES 9 3 - 10 %    EOSINOPHILS 1 0 - 5 %    BASOPHILS 0 0 - 2 %    ABS. NEUTROPHILS 6.1 1.8 - 8.0 K/UL    ABS. LYMPHOCYTES 2.0 0.9 - 3.6 K/UL    ABS. MONOCYTES 0.8 0.05 - 1.2 K/UL    ABS. EOSINOPHILS 0.1 0.0 - 0.4 K/UL  ABS. BASOPHILS 0.0 0.0 - 0.1 K/UL    DF AUTOMATED      PLATELET COMMENTS ADEQUATE PLATELETS      RBC COMMENTS ANISOCYTOSIS  1+       METABOLIC PANEL, COMPREHENSIVE    Collection Time: 05/15/18  2:23 AM   Result Value Ref Range    Sodium 144 136 - 145 mmol/L    Potassium 2.1 (LL) 3.5 - 5.5 mmol/L    Chloride 103 100 - 111 mmol/L    CO2 35 (H) 21 - 32 mmol/L    Anion gap 6 3.0 - 18 mmol/L    Glucose 115 (H) 74 - 99 mg/dL    BUN 7 7.0 - 18 MG/DL    Creatinine 0.72 0.6 - 1.3 MG/DL    BUN/Creatinine ratio 10 (L) 12 - 20      GFR est AA >60 >60 ml/min/1.33m    GFR est non-AA >60 >60 ml/min/1.79m   Calcium 7.0 (L) 8.5 - 10.1 MG/DL    Bilirubin, total 0.8 0.2 - 1.0 MG/DL    ALT (SGPT) 68 (H) 13 - 56 U/L    AST (SGOT) 91 (H) 10 - 38 U/L    Alk. phosphatase 48 45 - 117 U/L    Protein, total 6.2 (L) 6.4 - 8.2 g/dL    Albumin 2.9 (L) 3.4 - 5.0 g/dL    Globulin 3.3 2.0 - 4.0 g/dL    A-G Ratio 0.9 0.8 - 1.7     TROPONIN I    Collection Time: 05/15/18  2:23 AM   Result Value Ref Range    Troponin-I, QT 0.09 (H) 0.0 - 0.045 NG/ML   MAGNESIUM     Collection Time: 05/15/18  2:23 AM   Result Value Ref Range    Magnesium 1.0 (L) 1.6 - 2.6 mg/dL       Radiologic Studies -   Ct Head Wo Cont    Result Date: 05/15/2018  IMPRESSION: 1. No CT evidence of acute intracranial abnormality. 2. 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         Medical Decision Making     ED Course: Progress Notes, Reevaluation, and Consults:    3:41 AM Initial assessment performed. The patients presenting problems have been discussed, and they/their family are in agreement with the care plan formulated and outlined with them.  I have encouraged them to ask questions as they arise throughout their visit.    Case discussed with the hospitalist.    Provider Notes (Medical Decision Making): [age]ear old female presenting after syncopal episode.  Patient is well-appearing on exam but is having difficulty elevate both lower extremities, she can elevate them but she cannot keep them up for long.  No back pain or tenderness.  Could possibly be an electrolyte abnormality.  Will get screening labs including new cardiac work-up as well as a head CT.    Procedures:     Critical Care Time:     Vital Signs-Reviewed the patient's vital signs. Reviewed pt's pulse ox reading.     EKG:  Interpreted by the EP.   Time Interpreted:    Rate:    Rhythm:    Interpretation:   Comparison:     Records Reviewed: Nursing Notes (Time of Review: 3:41 AM)  -I am the first provider for this patient.  -I reviewed the vital signs, available nursing notes, past medical history, past surgical history, family history and social history.  Current Facility-Administered Medications   Medication Dose Route Frequency Provider Last Rate Last Dose   ??? aspirin chewable tablet 324 mg  324 mg Oral NOW Blossie Raffel A, MD       ??? magnesium sulfate 4 g/100 mL IVPB  4 g IntraVENous NOW Duwayne Matters A, MD        ??? potassium chloride 10 mEq, lidocaine (PF) (XYLOCAINE) 10 mg/mL (1 %) 1 mL in 0.9% sodium chloride 100 mL IVPB   IntraVENous Q1H Bobbye Charleston, MD         Current Outpatient Medications   Medication Sig Dispense Refill   ??? multivitamin (ONE A DAY) tablet Take 1 Tab by mouth daily.     ??? thiamine HCL (B-1) 100 mg tablet Take  by mouth daily.     ??? cyanocobalamin 1,000 mcg tablet Take 1,000 mcg by mouth daily.     ??? calcium carbonate (CALCIUM 300 PO) Take  by mouth.     ??? hyoscyamine sulfate (CYSTOSPAZ) 0.125 mg tablet 1 Tab by SubLINGual route every six (6) hours as needed for Other (Spasms). 17 Tab 0   ??? lisinopril (PRINIVIL, ZESTRIL) 40 mg tablet Take 1 Tab by mouth daily. 30 Tab 0   ??? pantoprazole (PROTONIX) 40 mg tablet Take 1 Tab by mouth Daily (before breakfast). 30 Tab 0   ??? ondansetron (ZOFRAN ODT) 4 mg disintegrating tablet Take 1 Tab by mouth every eight (8) hours as needed for Nausea. 15 Tab 0   ??? cholecalciferol, VITAMIN D3, (VITAMIN D3) 5,000 unit tab tablet Take 5,000 Units by mouth daily.     ??? albuterol (PROVENTIL HFA, VENTOLIN HFA, PROAIR HFA) 90 mcg/actuation inhaler Take 1-2 Puffs by inhalation every four (4) hours as needed for Wheezing. 1 Inhaler 0   ??? atorvastatin (LIPITOR) 20 mg tablet TAKE ONE TABLET BY MOUTH ONE TIME DAILY 90 Tab 2   ??? nystatin (MYCOSTATIN) topical cream Apply  to affected area two (2) times a day. 15 g 1   ??? alcohol swabs padm Use daily to test blood sugar  DX E11.9 100 Pad 1        Clinical Impression     Clinical Impression: No diagnosis found.    Disposition: Admit      This note was dictated utilizing voice recognition software which may lead to typographical errors.  I apologize in advance if the situation occurs.  If questions arise please do not hesitate to contact me or call our department.    Elverna Caffee Gregery Na, MD  3:41 AM

## 2018-05-15 NOTE — ED Notes (Signed)
TRANSFER - OUT REPORT:    Verbal report given to Tammy, RN  on Cheryl Paul  being transferred to [facility]  for routine progression of care       Report consisted of patient???s Situation, Background, Assessment and   Recommendations(SBAR).     Information from the following report(s) ED Summary was reviewed with the receiving nurse.    Lines:   Peripheral IV 05/15/18 Left Antecubital (Active)       Peripheral IV 05/15/18 Anterior;Left Forearm (Active)        Opportunity for questions and clarification was provided.      Patient transported with:   Transportation

## 2018-05-15 NOTE — ED Notes (Signed)
 TRANSFER - OUT REPORT:    Verbal report given to Tammy, RN  on Cheryl Paul  being transferred to [facility] [place]  for routine progression of care       Report consisted of patient's Situation, Background, Assessment and   Recommendations(SBAR).     Information from the following report(s) ED Summary was reviewed with the receiving nurse.    Lines:   Peripheral IV 05/15/18 Left Antecubital (Active)       Peripheral IV 05/15/18 Anterior;Left Forearm (Active)        Opportunity for questions and clarification was provided.      Patient transported with:   Transportation

## 2018-05-15 NOTE — H&P (Signed)
H&P by Roe Coombs, MD at 05/15/18 [phone]                Author: Roe Coombs, MD  Service: Hospitalist  Author Type: Physician       Filed: 05/15/18 0441  Date of Service: 05/15/18 0414  Status: Signed          Editor: Roe Coombs, MD (Physician)                    History & Physical          Patient: Cheryl Paul  MRN: [id number]   CSN: [id number]          Date of Birth: [birth year]   Age: 67 y.o.   Sex: female         DOA: 05/15/2018      Chief Complaint: No chief complaint on file.              HPI:        Cheryl Paul is a 67 y.o. African  American female who has PMH of severe obesity status post laparoscopic gastric bypass surgery 02/2018, hypertension, diabetes, sleep apnea and  stress incontinence   Presented to ER status post syncopal episode at home for unknown period of time(?15-20 min).   Patient states that she did not feel well for the last 2 days, generally weak with lower and upper extremities pain, claudications/spasms, patient states that she felt dizzy sat on the couch and cannot remember exactly what happened after that she was  waking up by EMS and brought to ER.   Patient also complains of multiple diarrheal episodes during the course of the day especially after surgery and inability to tolerate big meals with early satiety since her surgery.   Patient states that she is not allowed to have fluids as per her nutritionist and currently on proteins and vegetables diet plus multivitamins including vitamin D, B12 and folate supplements   In ER patient was found to have multiple electrolytes abnormalities including very low magnesium of 1, low potassium of 2.1, low albumin of 2.9 and low calcium of 7.5 corrected    Patient in general states that she feels much better since she came in and started on IV fluids   Will be admitted for multiple electrolyte correction   Of note, has mild elevation of troponin without chest pain or EKG changes        Past Medical History:         Diagnosis  Date         ?  Arthritis            knee, arms,          ?  Asthma       ?  Chronic obstructive pulmonary disease (HCC)       ?  Chronic pain       ?  Diabetes (HCC)       ?  H/O sinusitis       ?  Hypercholesterolemia       ?  Hypertension       ?  Sleep apnea            NOT USING          ?  Stress incontinence       ?  Toe fracture, left  August 2015          4th toe  Past Surgical History:         Procedure  Laterality  Date          ?  ABDOMEN SURGERY PROC UNLISTED              4 surguries for blockages          ?  HX CARPAL TUNNEL RELEASE  Bilateral       ?  HX CHOLECYSTECTOMY         ?  HX GYN         ?  HX HERNIA REPAIR              abdomen          ?  HX HYSTERECTOMY              tubes tied          ?  HX LAP GASTRIC BYPASS    02/21/2018     ?  HX ORTHOPAEDIC              catrpal tunnel right and left          ?  HX POLYPECTOMY    04/18/14             Family History         Problem  Relation  Age of Onset          ?  Hypertension  Mother       ?  Stroke  Mother       ?  Diabetes  Mother       ?  Thyroid Disease  Mother       ?  Arthritis-rheumatoid  Mother       ?  Cancer  Father                colon polpe          ?  Cancer  Maternal Aunt               Social History          Socioeconomic History         ?  Marital status:  SINGLE              Spouse name:  Not on file         ?  Number of children:  Not on file     ?  Years of education:  Not on file     ?  Highest education level:  Not on file       Tobacco Use         ?  Smoking status:  Never Smoker     ?  Smokeless tobacco:  Never Used       Substance and Sexual Activity         ?  Alcohol use:  No              Alcohol/week:  0.0 standard drinks         ?  Drug use:  No     ?  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 Denies history of smoking              Prior to Admission medications             Medication  Sig  Start Date  End  Date  Taking?  Authorizing Provider            multivitamin (ONE A DAY) tablet  Take 1 Tab by mouth daily.        Provider, Historical     thiamine HCL (B-1) 100 mg tablet  Take  by mouth daily.        Provider, Historical     cyanocobalamin 1,000 mcg tablet  Take 1,000 mcg by mouth daily.        Provider, Historical     calcium carbonate (CALCIUM 300 PO)  Take  by mouth.        Provider, Historical            hyoscyamine sulfate (CYSTOSPAZ) 0.125 mg tablet  1 Tab by SubLINGual route every six (6) hours as needed for Other (Spasms).  02/23/18      Meibers, Debbora LacrosseKristi N, NP            lisinopril (PRINIVIL, ZESTRIL) 40 mg tablet  Take 1 Tab by mouth daily.  02/23/18      Meibers, Debbora LacrosseKristi N, NP     pantoprazole (PROTONIX) 40 mg tablet  Take 1 Tab by mouth Daily (before breakfast).  02/23/18      Meibers, Debbora LacrosseKristi N, NP     ondansetron (ZOFRAN ODT) 4 mg disintegrating tablet  Take 1 Tab by mouth every eight (8) hours as needed for Nausea.  02/22/18      Estevan RyderAnderson, Talisha D, NP     cholecalciferol, VITAMIN D3, (VITAMIN D3) 5,000 unit tab tablet  Take 5,000 Units by mouth daily.        Provider, Historical     albuterol (PROVENTIL HFA, VENTOLIN HFA, PROAIR HFA) 90 mcg/actuation inhaler  Take 1-2 Puffs by inhalation every four (4) hours as needed for Wheezing.  05/24/17      Darryl LentJohnson, Jeffrey D, MD     atorvastatin (LIPITOR) 20 mg tablet  TAKE ONE TABLET BY MOUTH ONE TIME DAILY  07/18/15      Juanetta GoslingHawkins, Osborne Cascoindee J, PA-C     nystatin (MYCOSTATIN) topical cream  Apply  to affected area two (2) times a day.  07/17/15      Langston MaskerWright, Teresa R, DO            alcohol swabs padm  Use daily to test blood sugar  DX E11.9  07/17/15      Langston MaskerWright, Teresa R, DO             Allergies        Allergen  Reactions         ?  Penicillins  Itching     ?  Butrans [Buprenorphine]  Rash     ?  Codeine  Other (comments)             Nausea and abd pain         ?  Iodine  Rash     ?  Lactose  Diarrhea             Lactose intolerance              Review of Systems   GENERAL:  Patient alert, awake and oriented times 3, able to communicate full sentences and not in distress.    HEENT: No change in vision, no earache, tinnitus, sore throat or sinus congestion.    NECK: No pain or stiffness.    PULMONARY: No shortness of breath, cough or  wheeze.    Cardiovascular: no pnd / orthopnea, no CP   GASTROINTESTINAL: No abdominal pain, +nausea, no vomiting + diarrhea, no melena or bright red blood per rectum.    GENITOURINARY: No urinary frequency, urgency, hesitancy or dysuria.    MUSCULOSKELETAL: No joint or muscle pain, no back pain, no recent trauma.    DERMATOLOGIC: No rash, no itching, no lesions.    ENDOCRINE: No polyuria, polydipsia, no heat or cold intolerance. No recent change in weight.    HEMATOLOGICAL: No anemia or easy bruising or bleeding.    NEUROLOGIC: No headache, seizures, numbness, tingling or weakness.            Physical Exam:        Physical Exam:   Visit Vitals      BP  126/77 (BP 1 Location: Left arm)     Pulse  75     Temp  98.4 ??F (36.9 ??C)     Resp  18        SpO2  98%         O2 Device: Room air      Temp (24hrs), Avg:98.4 ??F (36.9 ??C), Min:98.4 ??F (36.9 ??C), Max:98.4 ??F (36.9 ??C)     No intake/output data recorded.    No intake/output data recorded.         General:   Alert, cooperative, no distress, appears stated age.                   Head:  Normocephalic, without obvious abnormality, atraumatic.     Eyes:   Conjunctivae/corneas clear. PERRL, EOMs intact.        Nose:  Nares normal. No drainage or sinus tenderness.        Neck:  Supple, symmetrical, trachea midline, no adenopathy, thyroid: no enlargement, no carotid bruit and no JVD.     Lungs:    Clear to auscultation bilaterally.        Heart:   Regular rate and rhythm, S1, S2 normal.          Abdomen:  Soft, non-tender. Bowel sounds normal.      Extremities:  Extremities normal, atraumatic, no cyanosis or edema.  Positive for spasms     Pulses:  2+ and symmetric all extremities.     Skin:   No rashes or lesions      Neurologic:  AAOx3, No focal motor or sensory deficit.           Labs Reviewed:      Lab results reviewed. For significant abnormal values and values requiring intervention, see assessment and plan.   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   3.hypomagnesemia   4.hypoalbuminemia      Hypertension  Diabetes mellitus but currently not on any meds since her surgery      Electrolytes abnormalities mostly secondary to short gut syndrome status post gastric bypass surgery   Patient has lost about 60 pounds since then      We will replace electrolytes as needed   Calcium gluconate, magnesium, potassium IV   IV fluids   Will need nutrition consult   We will get B12, folate and TSH levels   Series of cardiac enzymes   Echo   Cardiology consult if troponin trends up   Continue lisinopril   We will get A1c         DVT/GI Prophylaxis: Lovenox      Plan of care is discussed in details with Patient/Family at bedside and agreed upon      Roe Coombs, MD   05/15/2018 4:14 AM

## 2018-05-15 NOTE — Progress Notes (Signed)
[facility]  Progress Note    Patient: Cheryl Paul Age: 67 y.o. DOB: November 22, 1950 MR#: [id number] SSN: [id number]  Date/Time: 05/15/2018     Subjective:     Review of systems    Patient is comfortable, alert awake oriented,   Patient is complaining of leg cramps and calf tenderness  No CP   NO NVD  No SOB  NO Cough     Assessment/Plan:   Morbidly obese female, history of gastric bypass surgery, was in her usual state of health On Tuesday, December 24, following that she experience increasing shortness of breath [place], also she experienced leg cramps calf tenderness, no chest pain no nausea vomiting diarrhea.  This admission she is here for syncope and electrolyte abnormalities    1.  Syncope  -Likely vasovagal  -Multiple electrolytes abnormalities  -Rule out PE rule out DVT-she has allergy to iodine-I will get ultrasound of lower leg and VQ scan meanwhile continue treatment dose of Lovenox      2 severe hypokalemia -replacement on IV and monitor    3 severe hypo-Magness anemia on replacement IV and monitor    4 morbid obesity-status post gastric bypass surgery-currently on nutritional supplements    5 diabetes mellitus type 2 -not on any medications    6 anemia-of chronic illness-rule out deficiency disorder-follow iron profile and B12 level    Case discussed with:  [x] Patient  [x]  Family ( In room with patient )    [x] Nursing  [] Case Management  DVT Prophylaxis:  [x] Lovenox  [] Hep SQ  [] SCDs  [] Coumadin   [] On Heparin gtt          Objective:   VS:   Visit Vitals  BP 122/64   Pulse 77   Temp 98.4 ??F (36.9 ??C)   Resp 19   SpO2 100%      Tmax/24hrs: Temp (24hrs), Avg:98.4 ??F (36.9 ??C), Min:98.4 ??F (36.9 ??C), Max:98.4 ??F (36.9 ??C)  IOBRIEFNo intake or output data in the 24 hours ending 05/15/18 1403    General:  Alert, cooperative, no acute distress   Cardiovascular: S1S2 - regular , No Murmur   Pulmonary: Equal expansion , No Use of accessory  muscles , No Rales No Rhonchi    GI:  +BS in all four quadrants, soft, non-tender  Extremities:  No edema; 2+ dorsalis pedis pulses bilaterally//calf edema   Neuro: Alert and oriented X 2.       Medications:   Current Facility-Administered Medications   Medication Dose Route Frequency   ??? calcium gluconate injection 1 g  1 g IntraVENous ONCE   ??? cholecalciferol (VITAMIN D3) capsule 5,000 Units  5,000 Units Oral DAILY   ??? cyanocobalamin tablet 1,000 mcg  1,000 mcg Oral DAILY   ??? lisinopril (PRINIVIL, ZESTRIL) tablet 40 mg  40 mg Oral DAILY   ??? pantoprazole (PROTONIX) tablet 40 mg  40 mg Oral ACB   ??? thiamine HCL (B-1) tablet 100 mg  100 mg Oral DAILY   ??? therapeutic multivitamin (THERAGRAN) tablet 1 Tab  1 Tab Oral DAILY   ??? acetaminophen (TYLENOL) tablet 650 mg  650 mg Oral Q6H PRN   ??? naloxone (NARCAN) injection 0.4 mg  0.4 mg IntraVENous PRN   ??? ondansetron (ZOFRAN) injection 4 mg  4 mg IntraVENous Q6H PRN   ??? docusate sodium (COLACE) capsule 100 mg  100 mg Oral BID PRN   ??? 0.9% sodium chloride with KCl 20  mEq/L infusion   IntraVENous CONTINUOUS   ??? potassium chloride 10 mEq in 100 ml IVPB  10 mEq IntraVENous Q1H   ??? enoxaparin (LOVENOX) injection 80 mg  80 mg SubCUTAneous Q12H     Current Outpatient Medications   Medication Sig   ??? aspirin 81 mg chewable tablet Take 81 mg by mouth daily.   ??? multivitamin (ONE A DAY) tablet Take 1 Tab by mouth daily.   ??? thiamine HCL (B-1) 100 mg tablet Take  by mouth daily.   ??? cyanocobalamin 1,000 mcg tablet Take 1,000 mcg by mouth daily.   ??? calcium carbonate (CALCIUM 300 PO) Take  by mouth.   ??? hyoscyamine sulfate (CYSTOSPAZ) 0.125 mg tablet 1 Tab by SubLINGual route every six (6) hours as needed for Other (Spasms).   ??? lisinopril (PRINIVIL, ZESTRIL) 40 mg tablet Take 1 Tab by mouth daily.   ??? pantoprazole (PROTONIX) 40 mg tablet Take 1 Tab by mouth Daily (before breakfast).   ??? ondansetron (ZOFRAN ODT) 4 mg disintegrating tablet Take 1 Tab by mouth every eight (8) hours as  needed for Nausea.   ??? cholecalciferol, VITAMIN D3, (VITAMIN D3) 5,000 unit tab tablet Take 5,000 Units by mouth daily.   ??? albuterol (PROVENTIL HFA, VENTOLIN HFA, PROAIR HFA) 90 mcg/actuation inhaler Take 1-2 Puffs by inhalation every four (4) hours as needed for Wheezing.   ??? atorvastatin (LIPITOR) 20 mg tablet TAKE ONE TABLET BY MOUTH ONE TIME DAILY   ??? nystatin (MYCOSTATIN) topical cream Apply  to affected area two (2) times a day.   ??? alcohol swabs padm Use daily to test blood sugar  DX E11.9       Labs:    Recent Labs     05/15/18  0223   WBC 9.0   HGB 11.4*   HCT 37.5   PLT 120*     Recent Labs     05/15/18  1241 05/15/18  0223   NA 149* 144   K 2.4* 2.1*   CL 111 103   CO2 31 35*   GLU 84 115*   BUN 5* 7   CREA 0.60 0.72   CA 7.5* 7.0*   MG  --  1.0*   ALB  --  2.9*   SGOT  --  91*   ALT  --  68*         Signed By: Wanda Plumpirghayu Ashe Graybeal, MD     May 15, 2018

## 2018-05-15 NOTE — ED Provider Notes (Signed)
EMERGENCY DEPARTMENT HISTORY AND PHYSICAL EXAM    3:41 AM  Date: 05/15/2018  Patient Name: Cheryl Paul    History of Presenting Illness     No chief complaint on file.       History Provided By: Patient    HPI: Cheryl Paul is a 67 y.o. female with multiple medical problems as below status post gastric bypass.  Patient is presenting after syncopal event.  She felt lightheaded then sat down and put her head on a table and then the next thing she knew was EMS picking her up off of the floor.  Patient does remember the events or how long she was out for.  She is reporting heaviness in both lower extremities.  Denies any chest pain or shortness of breath.  No history of headache, nausea, vomiting or diarrhea.  No urinary symptoms.    Location:  Severity:  Timing/course:   Onset/Duration:     PCP: Gwyneth Revels, MD    Past History     Past Medical History:  Past Medical History:   Diagnosis Date   ??? Arthritis     knee, arms,    ??? Asthma    ??? Chronic obstructive pulmonary disease ([place])    ??? Chronic pain    ??? Diabetes ([place])    ??? H/O sinusitis    ??? Hypercholesterolemia    ??? Hypertension    ??? Sleep apnea     NOT USING    ??? Stress incontinence    ??? Toe fracture, left August 2015    4th toe       Past Surgical History:  Past Surgical History:   Procedure Laterality Date   ??? ABDOMEN SURGERY PROC UNLISTED      4 surguries for blockages   ??? HX CARPAL TUNNEL RELEASE Bilateral    ??? HX CHOLECYSTECTOMY     ??? HX GYN     ??? HX HERNIA REPAIR      abdomen   ??? HX HYSTERECTOMY      tubes tied   ??? 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 HX ORTHOPAEDIC      catrpal tunnel right and left   ??? HX POLYPECTOMY  04/18/14       Family History:  Family History   Problem Relation Age of Onset   ??? Hypertension Mother    ??? Stroke Mother    ??? Diabetes Mother    ??? Thyroid Disease Mother    ??? Arthritis-rheumatoid Mother    ??? Cancer Father         colon polpe   ??? Cancer Maternal Aunt        Social History:  Social History     Tobacco Use   ??? Smoking  status: Never Smoker   ??? Smokeless tobacco: Never Used   Substance Use Topics   ??? Alcohol use: No     Alcohol/week: 0.0 standard drinks   ??? Drug use: No       Allergies:  Allergies   Allergen Reactions   ??? Penicillins Itching   ??? Butrans [Buprenorphine] Rash   ??? Codeine Other (comments)     Nausea and abd pain   ??? Iodine Rash   ??? Lactose Diarrhea     Lactose intolerance       Review of Systems   Review of Systems   Neurological: Positive for syncope and weakness.   All other systems reviewed and are negative.  Physical Exam     Patient Vitals for the past 12 hrs:   Temp Pulse Resp BP SpO2   05/15/18 0212 98.4 ??F (36.9 ??C) 75 18 126/77 98 %       Physical Exam  Vitals signs and nursing note reviewed.   Constitutional:       Appearance: Normal appearance.   HENT:      Head: Normocephalic and atraumatic.      Nose: Nose normal.      Mouth/Throat:      Mouth: Mucous membranes are moist.   Eyes:      Extraocular Movements: Extraocular movements intact.   Neck:      Musculoskeletal: Neck supple.   Cardiovascular:      Rate and Rhythm: Normal rate.   Pulmonary:      Effort: Pulmonary effort is normal.      Breath sounds: Normal breath sounds.   Musculoskeletal:         General: No deformity.   Skin:     General: Skin is warm and dry.   Neurological:      Mental Status: She is alert and oriented to person, place, and time.      Comments: Unable to elevate both lower extremities but can push against resistance.   Psychiatric:         Mood and Affect: Mood normal.         Behavior: Behavior normal.         Diagnostic Study Results     Labs -  Recent Results (from the past 12 hour(s))   EKG, 12 LEAD, INITIAL    Collection Time: 05/15/18  2:19 AM   Result Value Ref Range    Ventricular Rate 77 BPM    Atrial Rate 77 BPM    P-R Interval 136 ms    QRS Duration 86 ms    Q-T Interval 346 ms    QTC Calculation (Bezet) 391 ms    Calculated P Axis 0 degrees    Calculated R Axis 6 degrees    Calculated T Axis -42 degrees     Diagnosis       Normal sinus rhythm with sinus arrhythmia  Nonspecific ST and T wave abnormality  Abnormal ECG  When compared with ECG of 01-Dec-2017 08:18,  Nonspecific T wave abnormality now evident in Inferior leads     CBC WITH AUTOMATED DIFF    Collection Time: 05/15/18  2:23 AM   Result Value Ref Range    WBC 9.0 4.6 - 13.2 K/uL    RBC 4.37 4.20 - 5.30 M/uL    HGB 11.4 (L) 12.0 - 16.0 g/dL    HCT 37.5 35.0 - 45.0 %    MCV 85.8 74.0 - 97.0 FL    MCH 26.1 24.0 - 34.0 PG    MCHC 30.4 (L) 31.0 - 37.0 g/dL    RDW 15.8 (H) 11.6 - 14.5 %    PLATELET 120 (L) 135 - 420 K/uL    NEUTROPHILS 68 40 - 73 %    LYMPHOCYTES 22 21 - 52 %    MONOCYTES 9 3 - 10 %    EOSINOPHILS 1 0 - 5 %    BASOPHILS 0 0 - 2 %    ABS. NEUTROPHILS 6.1 1.8 - 8.0 K/UL    ABS. LYMPHOCYTES 2.0 0.9 - 3.6 K/UL    ABS. MONOCYTES 0.8 0.05 - 1.2 K/UL    ABS. EOSINOPHILS 0.1 0.0 - 0.4 K/UL  ABS. BASOPHILS 0.0 0.0 - 0.1 K/UL    DF AUTOMATED      PLATELET COMMENTS ADEQUATE PLATELETS      RBC COMMENTS ANISOCYTOSIS  1+       METABOLIC PANEL, COMPREHENSIVE    Collection Time: 05/15/18  2:23 AM   Result Value Ref Range    Sodium 144 136 - 145 mmol/L    Potassium 2.1 (LL) 3.5 - 5.5 mmol/L    Chloride 103 100 - 111 mmol/L    CO2 35 (H) 21 - 32 mmol/L    Anion gap 6 3.0 - 18 mmol/L    Glucose 115 (H) 74 - 99 mg/dL    BUN 7 7.0 - 18 MG/DL    Creatinine 0.72 0.6 - 1.3 MG/DL    BUN/Creatinine ratio 10 (L) 12 - 20      GFR est AA >60 >60 ml/min/1.65m    GFR est non-AA >60 >60 ml/min/1.71m   Calcium 7.0 (L) 8.5 - 10.1 MG/DL    Bilirubin, total 0.8 0.2 - 1.0 MG/DL    ALT (SGPT) 68 (H) 13 - 56 U/L    AST (SGOT) 91 (H) 10 - 38 U/L    Alk. phosphatase 48 45 - 117 U/L    Protein, total 6.2 (L) 6.4 - 8.2 g/dL    Albumin 2.9 (L) 3.4 - 5.0 g/dL    Globulin 3.3 2.0 - 4.0 g/dL    A-G Ratio 0.9 0.8 - 1.7     TROPONIN I    Collection Time: 05/15/18  2:23 AM   Result Value Ref Range    Troponin-I, QT 0.09 (H) 0.0 - 0.045 NG/ML   MAGNESIUM    Collection Time: 05/15/18  2:23 AM    Result Value Ref Range    Magnesium 1.0 (L) 1.6 - 2.6 mg/dL       Radiologic Studies -   Ct Head Wo Cont    Result Date: 05/15/2018  IMPRESSION: 1. No CT evidence of acute intracranial abnormality. 2. 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         Medical Decision Making     ED Course: Progress Notes, Reevaluation, and Consults:    3:41 AM Initial assessment performed. The patients presenting problems have been discussed, and they/their family are in agreement with the care plan formulated and outlined with them.  I have encouraged them to ask questions as they arise throughout their visit.    Case discussed with the hospitalist.    Provider Notes (Medical Decision Making): [age]ear old female presenting after syncopal episode.  Patient is well-appearing on exam but is having difficulty elevate both lower extremities, she can elevate them but she cannot keep them up for long.  No back pain or tenderness.  Could possibly be an electrolyte abnormality.  Will get screening labs including new cardiac work-up as well as a head CT.    Procedures:     Critical Care Time:     Vital Signs-Reviewed the patient's vital signs. Reviewed pt's pulse ox reading.     EKG:  Interpreted by the EP.   Time Interpreted:    Rate:    Rhythm:    Interpretation:   Comparison:     Records Reviewed: Nursing Notes (Time of Review: 3:41 AM)  -I am the first provider for this patient.  -I reviewed the vital signs, available nursing notes, past medical history, past surgical history, family history and social history.  Current Facility-Administered Medications   Medication Dose Route Frequency Provider Last Rate Last Dose   ??? aspirin chewable tablet 324 mg  324 mg Oral NOW Jerry Haugen A, MD       ??? magnesium sulfate 4 g/100 mL IVPB  4 g IntraVENous NOW Sabre Romberger A, MD       ??? potassium chloride 10 mEq, lidocaine (PF) (XYLOCAINE) 10 mg/mL (1 %) 1 mL in 0.9% sodium chloride 100  mL IVPB   IntraVENous Q1H Bobbye Charleston, MD         Current Outpatient Medications   Medication Sig Dispense Refill   ??? multivitamin (ONE A DAY) tablet Take 1 Tab by mouth daily.     ??? thiamine HCL (B-1) 100 mg tablet Take  by mouth daily.     ??? cyanocobalamin 1,000 mcg tablet Take 1,000 mcg by mouth daily.     ??? calcium carbonate (CALCIUM 300 PO) Take  by mouth.     ??? hyoscyamine sulfate (CYSTOSPAZ) 0.125 mg tablet 1 Tab by SubLINGual route every six (6) hours as needed for Other (Spasms). 17 Tab 0   ??? lisinopril (PRINIVIL, ZESTRIL) 40 mg tablet Take 1 Tab by mouth daily. 30 Tab 0   ??? pantoprazole (PROTONIX) 40 mg tablet Take 1 Tab by mouth Daily (before breakfast). 30 Tab 0   ??? ondansetron (ZOFRAN ODT) 4 mg disintegrating tablet Take 1 Tab by mouth every eight (8) hours as needed for Nausea. 15 Tab 0   ??? cholecalciferol, VITAMIN D3, (VITAMIN D3) 5,000 unit tab tablet Take 5,000 Units by mouth daily.     ??? albuterol (PROVENTIL HFA, VENTOLIN HFA, PROAIR HFA) 90 mcg/actuation inhaler Take 1-2 Puffs by inhalation every four (4) hours as needed for Wheezing. 1 Inhaler 0   ??? atorvastatin (LIPITOR) 20 mg tablet TAKE ONE TABLET BY MOUTH ONE TIME DAILY 90 Tab 2   ??? nystatin (MYCOSTATIN) topical cream Apply  to affected area two (2) times a day. 15 g 1   ??? alcohol swabs padm Use daily to test blood sugar  DX E11.9 100 Pad 1        Clinical Impression     Clinical Impression: No diagnosis found.    Disposition: Admit      This note was dictated utilizing voice recognition software which may lead to typographical errors.  I apologize in advance if the situation occurs.  If questions arise please do not hesitate to contact me or call our department.    Xian Apostol Gregery Na, MD  3:41 AM

## 2018-05-15 NOTE — ED Notes (Signed)
Patient for CT Chest X-ray complete.

## 2018-05-16 ENCOUNTER — Inpatient Hospital Stay: Admit: 2018-05-16 | Payer: MEDICARE | Attending: Internal Medicine | Primary: Family Medicine

## 2018-05-16 ENCOUNTER — Inpatient Hospital Stay: Admit: 2018-05-16 | Payer: MEDICARE | Primary: Family Medicine

## 2018-05-16 LAB — BASIC METABOLIC PANEL
Anion Gap: 8 mmol/L (ref 3.0–18)
Anion Gap: 7 mmol/L (ref 3.0–18)
BUN: 5 MG/DL — ABNORMAL LOW (ref 7.0–18)
BUN: 3 MG/DL — ABNORMAL LOW (ref 7.0–18)
Bun/Cre Ratio: 10 — ABNORMAL LOW (ref 12–20)
Bun/Cre Ratio: 5 — ABNORMAL LOW (ref 12–20)
CO2: 30 mmol/L (ref 21–32)
CO2: 31 mmol/L (ref 21–32)
Calcium: 7.1 MG/DL — ABNORMAL LOW (ref 8.5–10.1)
Calcium: 7.8 MG/DL — ABNORMAL LOW (ref 8.5–10.1)
Chloride: 108 mmol/L (ref 100–111)
Chloride: 110 mmol/L (ref 100–111)
Creatinine: 0.51 MG/DL — ABNORMAL LOW (ref 0.6–1.3)
Creatinine: 0.57 MG/DL — ABNORMAL LOW (ref 0.6–1.3)
EGFR IF NonAfrican American: >60 mL/min/{1.73_m2} (ref >60)
EGFR IF NonAfrican American: >60 mL/min/{1.73_m2} (ref >60)
GFR African American: >60 mL/min/{1.73_m2} (ref >60)
GFR African American: >60 mL/min/{1.73_m2} (ref >60)
Glucose: 83 mg/dL (ref 74–99)
Glucose: 134 mg/dL — ABNORMAL HIGH (ref 74–99)
Potassium: 2 mmol/L — CL (ref 3.5–5.5)
Potassium: 2.3 mmol/L — CL (ref 3.5–5.5)
Sodium: 146 mmol/L — ABNORMAL HIGH (ref 136–145)
Sodium: 148 mmol/L — ABNORMAL HIGH (ref 136–145)

## 2018-05-16 LAB — TRANSTHORACIC ECHOCARDIOGRAM (TTE) COMPLETE (CONTRAST/BUBBLE/3D PRN)
Aortic Root: 3.27 cm
Fractional Shortening 2D: 34.8301 %
IVSd: 1.11 cm — AB (ref 0.6–0.9)
LA Area 4C: 20.7 cm2
LA Volume 2C: 113.19 mL — AB (ref 22–52)
LA Volume 4C: 60.53 mL — AB (ref 22–52)
LA Volume BP: 94.59 mL (ref 22–52)
LA Volume Index 2C: 56.21 ml/m2 (ref 16–28)
LA Volume Index 4C: 30.06 ml/m2 (ref 16–28)
LA Volume Index BP: 46.97 ml/m2 (ref 16–28)
LV EDV Teich: 0.6324 mL
LV ESV Teich: 0.2274 mL
LV ESV Teich: 30.7756 mL
LV Mass 2D Index: 103.4 g/m2 (ref 43–95)
LV Mass 2D: 208.2 g — AB (ref 67–162)
LVIDd: 4.68 cm (ref 3.9–5.3)
LVIDs: 3.05 cm
LVOT Diameter: 2.01 cm
LVPWd: 1.02 cm — AB (ref 0.6–0.9)
Left Ventricular Ejection Fraction: 58
MV A Velocity: 77.71 cm/s
MV E Velocity: 96.8 cm/s
MV E Wave Deceleration Time: 183 ms
MV E/A: 1.25
Mitral Valve E-F Slope by M-mode: 5.2906
PASP: 24 mmHg
Pulm Vein A Duration: 110.7 ms
Pulm Vein A Velocity: 27.35 cm/s
TR Max Velocity: 227.72 cm/s
TR Peak Gradient: 20.7 mmHg

## 2018-05-16 LAB — MAGNESIUM
Magnesium: 1.7 mg/dL (ref 1.6–2.6)
Magnesium: 1.7 mg/dL (ref 1.6–2.6)

## 2018-05-16 LAB — CARDIAC PANEL
CK-MB Index: 0.1 % (ref 0.0–4.0)
CK-MB: 4.8 ng/ml — ABNORMAL HIGH (ref <3.6)
Total CK: 4307 U/L — ABNORMAL HIGH (ref 26–192)
Troponin I: 0.09 NG/ML — ABNORMAL HIGH (ref 0.0–0.045)

## 2018-05-16 LAB — FERRITIN
Ferritin: 401 NG/ML — ABNORMAL HIGH (ref 8–388)
Ferritin: 401 NG/ML — ABNORMAL HIGH (ref 8–388)

## 2018-05-16 LAB — HEMOGLOBIN A1C W/EAG
Hemoglobin A1C: 5.3 % (ref 4.2–5.6)
eAG: 105 mg/dL

## 2018-05-16 LAB — CBC WITH AUTO DIFFERENTIAL
Basophils %: 1 % (ref 0–2)
Basophils Absolute: 0.1 10*3/uL (ref 0.0–0.1)
Eosinophils %: 3 % (ref 0–5)
Eosinophils Absolute: 0.2 10*3/uL (ref 0.0–0.4)
Hematocrit: 31.4 % — ABNORMAL LOW (ref 35.0–45.0)
Hemoglobin: 9.9 g/dL — ABNORMAL LOW (ref 12.0–16.0)
Lymphocytes %: 41 % (ref 21–52)
Lymphocytes Absolute: 2.2 10*3/uL (ref 0.9–3.6)
MCH: 27.2 PG (ref 24.0–34.0)
MCHC: 31.5 g/dL (ref 31.0–37.0)
MCV: 86.3 FL (ref 74.0–97.0)
Monocytes %: 7 % (ref 3–10)
Monocytes Absolute: 0.4 10*3/uL (ref 0.05–1.2)
Neutrophils %: 48 % (ref 40–73)
Neutrophils Absolute: 2.4 10*3/uL (ref 1.8–8.0)
Platelet Comment: DECREASED
Platelets: 99 10*3/uL — ABNORMAL LOW (ref 135–420)
RBC: 3.64 M/uL — ABNORMAL LOW (ref 4.20–5.30)
RDW: 16.1 % — ABNORMAL HIGH (ref 11.6–14.5)
WBC: 5.3 10*3/uL (ref 4.6–13.2)

## 2018-05-16 LAB — IRON AND TIBC
Iron Saturation: 22 %
Iron: 32 ug/dL — ABNORMAL LOW (ref 50–175)
TIBC: 145 ug/dL — ABNORMAL LOW (ref 250–450)

## 2018-05-16 LAB — VITAMIN B12 & FOLATE
Folate: 9 ng/mL (ref 3.10–17.50)
Folate: 9 ng/mL (ref 3.10–17.50)
Vitamin B-12: 1636 pg/mL — ABNORMAL HIGH (ref 211–911)
Vitamin B12: 1636 pg/mL — ABNORMAL HIGH (ref 211–911)

## 2018-05-16 LAB — PHOSPHORUS
Phosphorus: 2.9 MG/DL (ref 2.5–4.9)
Phosphorus: 2.9 MG/DL (ref 2.5–4.9)

## 2018-05-16 LAB — METABOLIC PANEL, BASIC
Anion gap: 8 mmol/L (ref 3.0–18)
Anion gap: 7 mmol/L (ref 3.0–18)
BUN/Creatinine ratio: 10 — ABNORMAL LOW (ref 12–20)
BUN/Creatinine ratio: 5 — ABNORMAL LOW (ref 12–20)
BUN: 5 MG/DL — ABNORMAL LOW (ref 7.0–18)
BUN: 3 MG/DL — ABNORMAL LOW (ref 7.0–18)
CO2: 30 mmol/L (ref 21–32)
CO2: 31 mmol/L (ref 21–32)
Calcium: 7.1 MG/DL — ABNORMAL LOW (ref 8.5–10.1)
Calcium: 7.8 MG/DL — ABNORMAL LOW (ref 8.5–10.1)
Chloride: 108 mmol/L (ref 100–111)
Chloride: 110 mmol/L (ref 100–111)
Creatinine: 0.51 MG/DL — ABNORMAL LOW (ref 0.6–1.3)
Creatinine: 0.57 MG/DL — ABNORMAL LOW (ref 0.6–1.3)
GFR est AA: >60 mL/min/{1.73_m2} (ref >60)
GFR est AA: >60 mL/min/{1.73_m2} (ref >60)
GFR est non-AA: >60 mL/min/{1.73_m2} (ref >60)
GFR est non-AA: >60 mL/min/{1.73_m2} (ref >60)
Glucose: 83 mg/dL (ref 74–99)
Glucose: 134 mg/dL — ABNORMAL HIGH (ref 74–99)
Potassium: 2 mmol/L — CL (ref 3.5–5.5)
Potassium: 2.3 mmol/L — CL (ref 3.5–5.5)
Sodium: 146 mmol/L — ABNORMAL HIGH (ref 136–145)
Sodium: 148 mmol/L — ABNORMAL HIGH (ref 136–145)

## 2018-05-16 LAB — ECHO ADULT COMPLETE
Aortic Root: 3.27 cm
IVSd: 1.11 cm — AB (ref 0.6–0.9)
LA Area 4C: 20.7 cm2
LA Volume 2C: 113.19 mL — AB (ref 22–52)
LA Volume 4C: 60.53 mL — AB (ref 22–52)
LA Volume BP: 94.59 mL (ref 22–52)
LA Volume Index 2C: 56.21 ml/m2 (ref 16–28)
LA Volume Index 4C: 30.06 ml/m2 (ref 16–28)
LA Volume Index BP: 46.97 ml/m2 (ref 16–28)
LV EDV Teich: 0.6324 mL
LV ESV Teich: 0.2274 mL
LV Mass 2D Index: 103.4 g/m2 (ref 43–95)
LV Mass 2D: 208.2 g — AB (ref 67–162)
LVIDd: 4.68 cm (ref 3.9–5.3)
LVIDs: 3.05 cm
LVOT Diameter: 2.01 cm
LVPWd: 1.02 cm — AB (ref 0.6–0.9)
Left Ventricular Fractional Shortening by 2D: 34.8301 %
Left Ventricular Stroke Volume by Teichholz Method: 30.7756 mL
MV A Velocity: 77.71 cm/s
MV E Velocity: 96.8 cm/s
MV E Wave Deceleration Time: 183 ms
MV E/A: 1.25
Mitral Valve Deceleration Slope: 5.2906
PASP: 24 mmHg
Pulm Vein A Duration: 110.7 ms
Pulm Vein A Velocity: 27.35 cm/s
TR Max Velocity: 227.72 cm/s
TR Peak Gradient: 20.7 mmHg

## 2018-05-16 LAB — CARDIAC PANEL,(CK, CKMB & TROPONIN)
CK - MB: 4.8 ng/ml — ABNORMAL HIGH (ref <3.6)
CK-MB Index: 0.1 % (ref 0.0–4.0)
CK: 4307 U/L — ABNORMAL HIGH (ref 26–192)
Troponin-I, QT: 0.09 NG/ML — ABNORMAL HIGH (ref 0.0–0.045)

## 2018-05-16 LAB — CBC WITH AUTOMATED DIFF
ABS. BASOPHILS: 0.1 10*3/uL (ref 0.0–0.1)
ABS. EOSINOPHILS: 0.2 10*3/uL (ref 0.0–0.4)
ABS. LYMPHOCYTES: 2.2 10*3/uL (ref 0.9–3.6)
ABS. MONOCYTES: 0.4 10*3/uL (ref 0.05–1.2)
ABS. NEUTROPHILS: 2.4 10*3/uL (ref 1.8–8.0)
BASOPHILS: 1 % (ref 0–2)
EOSINOPHILS: 3 % (ref 0–5)
HCT: 31.4 % — ABNORMAL LOW (ref 35.0–45.0)
HGB: 9.9 g/dL — ABNORMAL LOW (ref 12.0–16.0)
LYMPHOCYTES: 41 % (ref 21–52)
MCH: 27.2 PG (ref 24.0–34.0)
MCHC: 31.5 g/dL (ref 31.0–37.0)
MCV: 86.3 FL (ref 74.0–97.0)
MONOCYTES: 7 % (ref 3–10)
NEUTROPHILS: 48 % (ref 40–73)
PLATELET COMMENTS: DECREASED
PLATELET: 99 10*3/uL — ABNORMAL LOW (ref 135–420)
RBC: 3.64 M/uL — ABNORMAL LOW (ref 4.20–5.30)
RDW: 16.1 % — ABNORMAL HIGH (ref 11.6–14.5)
WBC: 5.3 10*3/uL (ref 4.6–13.2)

## 2018-05-16 LAB — HEMOGLOBIN A1C WITH EAG
Est. average glucose: 105 mg/dL
Hemoglobin A1c: 5.3 % (ref 4.2–5.6)

## 2018-05-16 LAB — IRON PROFILE
Iron % saturation: 22 %
Iron: 32 ug/dL — ABNORMAL LOW (ref 50–175)
TIBC: 145 ug/dL — ABNORMAL LOW (ref 250–450)

## 2018-05-16 MED ORDER — SODIUM CHLORIDE 0.9 % IV
2 mEq/mL | INTRAVENOUS | Status: AC
Start: 2018-05-16 — End: 2018-05-16
  Administered 2018-05-16 – 2018-05-17 (×5): via INTRAVENOUS

## 2018-05-16 MED ORDER — POTASSIUM CHLORIDE 10 MEQ/100 ML IV PIGGY BACK
10 mEq/0 mL | INTRAVENOUS | Status: DC
Start: 2018-05-16 — End: 2018-05-16

## 2018-05-16 MED ORDER — MAGNESIUM OXIDE 400 MG TAB
400 mg | Freq: Every day | ORAL | Status: DC
Start: 2018-05-16 — End: 2018-05-22
  Administered 2018-05-17 – 2018-05-22 (×6): via ORAL

## 2018-05-16 MED ORDER — POTASSIUM CHLORIDE 2 MEQ/ML IV SOLN
2 mEq/mL | INTRAVENOUS | Status: DC
Start: 2018-05-16 — End: 2018-05-22
  Administered 2018-05-16 – 2018-05-22 (×9): via INTRAVENOUS

## 2018-05-16 MED ORDER — TECHNETIUM TO 99M ALBUMIN AGGREGATED
Freq: Once | Status: AC
Start: 2018-05-16 — End: 2018-05-16
  Administered 2018-05-16: 18:00:00 via INTRAVENOUS

## 2018-05-16 MED ORDER — TECHNETIUM TC 99M PENTETATE AEROSOL
Freq: Once | Status: AC
Start: 2018-05-16 — End: 2018-05-16
  Administered 2018-05-16: 18:00:00 via RESPIRATORY_TRACT

## 2018-05-16 MED ORDER — ENOXAPARIN 100 MG/ML SUB-Q SYRINGE
100 mg/mL | Freq: Two times a day (BID) | SUBCUTANEOUS | Status: DC
Start: 2018-05-16 — End: 2018-05-17
  Administered 2018-05-16: 23:00:00 via SUBCUTANEOUS

## 2018-05-16 MED ORDER — ENOXAPARIN 120 MG/0.8 ML SUB-Q SYRINGE
120 mg/0.8 mL | Freq: Two times a day (BID) | SUBCUTANEOUS | Status: DC
Start: 2018-05-16 — End: 2018-05-16
  Administered 2018-05-16: 12:00:00 via SUBCUTANEOUS

## 2018-05-16 MED ORDER — POTASSIUM CHLORIDE 10 MEQ/100 ML IV PIGGY BACK
10 mEq/0 mL | INTRAVENOUS | Status: AC
Start: 2018-05-16 — End: 2018-05-16

## 2018-05-16 MED FILL — POTASSIUM CHLORIDE 2 MEQ/ML IV SOLN: 2 mEq/mL | INTRAVENOUS | Qty: 5

## 2018-05-16 MED FILL — POTASSIUM CHLORIDE 10 MEQ/100 ML IV PIGGY BACK: 10 mEq/0 mL | INTRAVENOUS | Qty: 100

## 2018-05-16 MED FILL — LOVENOX 100 MG/ML SUBCUTANEOUS SYRINGE: 100 mg/mL | SUBCUTANEOUS | Qty: 1

## 2018-05-16 MED FILL — LOVENOX 120 MG/0.8 ML SUBCUTANEOUS SYRINGE: 120 mg/0.8 mL | SUBCUTANEOUS | Qty: 0.8

## 2018-05-16 MED FILL — LISINOPRIL 40 MG TAB: 40 mg | ORAL | Qty: 1

## 2018-05-16 MED FILL — SODIUM CHLORIDE 0.45 % IV: 0.45 % | INTRAVENOUS | Qty: 1000

## 2018-05-16 MED FILL — CYANOCOBALAMIN 1,000 MCG TAB: 1000 mcg | ORAL | Qty: 1

## 2018-05-16 MED FILL — THIAMINE HCL 100 MG TAB: 100 mg | ORAL | Qty: 1

## 2018-05-16 MED FILL — PANTOPRAZOLE 40 MG TAB, DELAYED RELEASE: 40 mg | ORAL | Qty: 1

## 2018-05-16 MED FILL — CHOLECALCIFEROL (VITAMIN D3) 5,000 UNIT CAP: ORAL | Qty: 1

## 2018-05-16 MED FILL — THERAPEUTIC MULTIVITAMIN TAB: ORAL | Qty: 1

## 2018-05-16 NOTE — Other (Signed)
Verbal bedside shift report received from Karen

## 2018-05-16 NOTE — Progress Notes (Signed)
[facility]  Progress Note    Patient: Cheryl MawBrenda T Paul Age: 67 y.o. DOB: [birth year] MR#: [id number] SSN: [id number]  Date/Time: 05/16/2018     Subjective:     Sitting in chair   Comfortable   Patient is complaining of numbness, tingling and pain from feet to thights   Also c/o leg cramps and calf tenderness  No dizziness, CP, SOB, cough, n/v/d     Assessment/Plan:     1. Syncope- Likely vasovagal and multiple electrolytes abnormalities  2. Severe hypokalemia  3. Severe hypomagnesemia  4. Hypocalcemia   5. Hypoalbuminemia   6. Elevated troponin- likely 2/2 demand ischemia in setting of syncope and severe electrolyte abnormalities   6. Morbid obesity-status post gastric bypass surgery-currently on nutritional supplements  7. Type II DM - HbA1c 5.3 on 05/16/18; not on any medications  8. Anemia-of chronic illness-rule out deficiency disorder-follow iron profile and B12 level  9. HTN       FU BLE US   FU V/Q scan  FU Echo   Continue therapeutic lovenox until PVL and V/Q scan negative   Continue home vitamins, supplements   Daily mag ox   FU BMP q12 hrs   Daily BMP, Mg   Check HbA1c   Nutrition, PT, OT        Case discussed with:  [x] Patient  [x]  Family ( In room with patient )    [x] Nursing  [] Case Management  DVT Prophylaxis:  [x] Lovenox  [] Hep SQ  [] SCDs  [] Coumadin   [] On Heparin gtt  Diet: Cardiac   Code Status: FULL   Disposition: Cont care in telemetry; >2 midnights    H. Clabe SealAdam Nashonda Limberg, DO   May 16, 2018           Objective:   VS:   Visit Vitals  BP 137/78   Pulse 76   Temp 98.4 ??F (36.9 ??C)   Resp 17   Ht 5\' 3"  (1.6 m)   Wt 99.8 kg (220 lb)   SpO2 99%   BMI 38.97 kg/m??      Tmax/24hrs: Temp (24hrs), Avg:98.1 ??F (36.7 ??C), Min:97.6 ??F (36.4 ??C), Max:98.5 ??F (36.9 ??C)  IOBRIEFNo intake or output data in the 24 hours ending 05/16/18 1547    General:  Alert, cooperative, no acute distress   Cardiovascular: S1S2 - regular , No Murmur    Pulmonary: Equal expansion , No Use of accessory muscles , No Rales No Rhonchi    GI:  +BS in all four quadrants, soft, non-tender  Extremities:  No edema; 2+ dorsalis pedis pulses bilaterally//calf edema   Neuro: Alert and oriented X 3      Medications:   Current Facility-Administered Medications   Medication Dose Route Frequency   ??? potassium chloride 10 mEq, lidocaine (PF) (XYLOCAINE) 10 mg/mL (1 %) 1 mL in 0.9% sodium chloride 100 mL IVPB   IntraVENous Q1H   ??? enoxaparin (LOVENOX) injection 100 mg  100 mg SubCUTAneous Q12H   ??? cholecalciferol (VITAMIN D3) capsule 5,000 Units  5,000 Units Oral DAILY   ??? cyanocobalamin tablet 1,000 mcg  1,000 mcg Oral DAILY   ??? lisinopril (PRINIVIL, ZESTRIL) tablet 40 mg  40 mg Oral DAILY   ??? pantoprazole (PROTONIX) tablet 40 mg  40 mg Oral ACB   ??? thiamine HCL (B-1) tablet 100 mg  100 mg Oral DAILY   ??? therapeutic multivitamin (THERAGRAN) tablet 1 Tab  1 Tab Oral DAILY   ??? acetaminophen (TYLENOL)  tablet 650 mg  650 mg Oral Q6H PRN   ??? naloxone (NARCAN) injection 0.4 mg  0.4 mg IntraVENous PRN   ??? ondansetron (ZOFRAN) injection 4 mg  4 mg IntraVENous Q6H PRN   ??? docusate sodium (COLACE) capsule 100 mg  100 mg Oral BID PRN   ??? influenza vaccine 2019-20 (6 mos+)(PF) (FLUARIX/FLULAVAL/FLUZONE QUAD) injection 0.5 mL  0.5 mL IntraMUSCular PRIOR TO DISCHARGE       Labs:    Recent Labs     05/16/18  0524 05/15/18  0223   WBC 5.3 9.0   HGB 9.9* 11.4*   HCT 31.4* 37.5   PLT 99* 120*     Recent Labs     05/16/18  1505 05/16/18  0524 05/15/18  1241 05/15/18  0223   NA 148* 146* 149* 144   K 2.3* 2.0* 2.4* 2.1*   CL 110 108 111 103   CO2 31 30 31  35*   GLU 134* 83 84 115*   BUN 3* 5* 5* 7   CREA 0.57* 0.51* 0.60 0.72   CA 7.8* 7.1* 7.5* 7.0*   MG  --  1.7  --  1.0*   PHOS  --  2.9  --   --    ALB  --   --   --  2.9*   SGOT  --   --   --  91*   ALT  --   --   --  68*

## 2018-05-16 NOTE — Other (Signed)
Bedside and Verbal shift change report given to T Straka (oncoming nurse) by Blair HaileyKaren S Arty Lantzy   (offgoing nurse). Report included the following information SBAR, Kardex, Intake/Output, MAR, Recent Results and Cardiac Rhythm Sinus Rhythm.

## 2018-05-16 NOTE — Progress Notes (Signed)
Patient off unit when visited for initial assessment.    Mitzy Naron, BSN,RN  Care Management  [phone]

## 2018-05-16 NOTE — Progress Notes (Signed)
Chaplain conducted an initial consultation and Spiritual Assessment for Cheryl Paul, who is a 67 y.o.,female. Patient???s Primary Language is: English.   According to the patient???s EMR Religious Affiliation is: No preference.     The reason the Patient came to the hospital is:   Patient Active Problem List    Diagnosis Date Noted   ??? Hypokalemia 05/15/2018   ??? Hypomagnesemia 05/15/2018   ??? Syncope 05/15/2018   ??? Elevated troponin 05/15/2018   ??? Short gut syndrome 05/15/2018   ??? Hypocalcemia 05/15/2018   ??? Morbid obesity with BMI of 45.0-49.9, adult (HCC) 02/21/2018   ??? Obesity, morbid (HCC) 08/16/2017   ??? Internal hemorrhoids 04/18/2014   ??? Colon polyps 04/18/2014   ??? Vitamin D deficiency 02/19/2014   ??? Chronic infection of sinus 11/14/2013   ??? Type 2 diabetes mellitus without complication (HCC) 11/14/2013   ??? Incontinence in female 11/14/2013   ??? Candidal intertrigo 11/14/2013   ??? Arthritis, multiple joint involvement 11/14/2013   ??? Essential hypertension 11/14/2013   ??? COPD (chronic obstructive pulmonary disease) (HCC) 11/14/2013   ??? Hyperlipidemia 11/14/2013        The Chaplain provided the following Interventions:  Initiated a relationship of care and support.   Explored issues of faith, belief, spirituality and religious/ritual needs while hospitalized.  Listened empathically.  Provided information about Spiritual Care Services.  Offered prayer and assurance of continued prayers on patient's behalf.   Chart reviewed.    The following outcomes where achieved:  Chaplain confirmed Patient's Religious Affiliation.  Patient expressed gratitude for chaplain's visit.    Assessment:  Patient's faith in God is very important for her to cope.  Patient does not have any religious/cultural needs that will affect patient???s preferences in health care.  There are no spiritual or religious issues which require intervention at this time.     Plan:  Chaplains will continue to follow and will provide pastoral care on an as  needed/requested basis.  Chaplain recommends bedside caregivers page chaplain on duty if patient shows signs of acute spiritual or emotional distress.    Chaplain Kerry Pokines  Spiritual Care  [phone])

## 2018-05-16 NOTE — Progress Notes (Signed)
Dr Khanna informed about potassium, will put orders in

## 2018-05-16 NOTE — Other (Signed)
Verbal bedside shift report given to Janine LimboKiara RN on coming nurse by Juanetta Beetsamara J. Straka, RN off going nurse including SBAR, KARDEX and [facility]

## 2018-05-16 NOTE — Progress Notes (Signed)
Potassium 2.0, hospitalist paged

## 2018-05-16 NOTE — Progress Notes (Signed)
Patient off unit when visited for initial assessment.    Cheryl Paul, [facility]  Care Management  [phone]

## 2018-05-16 NOTE — Progress Notes (Signed)
Dr Judi SaaKhanna informed about potassium, will put orders in

## 2018-05-16 NOTE — Progress Notes (Signed)
Chaplain conducted an initial consultation and Spiritual Assessment for Cheryl Paul, who is a 67 y.o.,female. Patient's Primary Language is: [place].   According to the patient's EMR Religious Affiliation is: No preference.     The reason the Patient came to the hospital is:   Patient Active Problem List    Diagnosis Date Noted   . Hypokalemia 05/15/2018   . Hypomagnesemia 05/15/2018   . Syncope 05/15/2018   . Elevated troponin 05/15/2018   . Short gut syndrome 05/15/2018   . Hypocalcemia 05/15/2018   . Morbid obesity with BMI of 45.0-49.9, adult (HCC) 02/21/2018   . Obesity, morbid (HCC) 08/16/2017   . Internal hemorrhoids 04/18/2014   . Colon polyps 04/18/2014   . Vitamin D deficiency 02/19/2014   . Chronic infection of sinus 11/14/2013   . Type 2 diabetes mellitus without complication (HCC) 11/14/2013   . Incontinence in female 11/14/2013   . Candidal intertrigo 11/14/2013   . Arthritis, multiple joint involvement 11/14/2013   . Essential hypertension 11/14/2013   . COPD (chronic obstructive pulmonary disease) (HCC) 11/14/2013   . Hyperlipidemia 11/14/2013        The Chaplain provided the following Interventions:  Initiated a relationship of care and support.   Explored issues of faith, belief, spirituality and religious/ritual needs while hospitalized.  Listened empathically.  Provided information about Spiritual Care Services.  Offered prayer and assurance of continued prayers on patient's behalf.   Chart reviewed.    The following outcomes where achieved:  Chaplain confirmed Patient's Religious Affiliation.  Patient expressed gratitude for chaplain's visit.    Assessment:  Patient's faith in God is very important for her to cope.  Patient does not have any religious/cultural needs that will affect patient's preferences in health care.  There are no spiritual or religious issues which require intervention at this time.     Plan:  Chaplains will continue to follow and will provide pastoral care on an as  needed/requested basis.  Chaplain recommends bedside caregivers page chaplain on duty if patient shows signs of acute spiritual or emotional distress.    Chaplain Eilene Ghazi  Spiritual Care  [phone])

## 2018-05-16 NOTE — Progress Notes (Signed)
Potassium 2.0, hospitalist paged

## 2018-05-16 NOTE — Progress Notes (Signed)
[facility]  Progress Note    Patient: Cheryl MawBrenda T Karow Age: 67 y.o. DOB: 1950-06-12 MR#: [id number] SSN: [id number]  Date/Time: 05/16/2018     Subjective:     Sitting in chair   Comfortable   Patient is complaining of numbness, tingling and pain from feet to thights   Also c/o leg cramps and calf tenderness  No dizziness, CP, SOB, cough, n/v/d     Assessment/Plan:     1. Syncope- Likely vasovagal and multiple electrolytes abnormalities  2. Severe hypokalemia  3. Severe hypomagnesemia  4. Hypocalcemia   5. Hypoalbuminemia   6. Elevated troponin- likely 2/2 demand ischemia in setting of syncope and severe electrolyte abnormalities   6. Morbid obesity-status post gastric bypass surgery-currently on nutritional supplements  7. Type II DM - HbA1c 5.3 on 05/16/18; not on any medications  8. Anemia-of chronic illness-rule out deficiency disorder-follow iron profile and B12 level  9. HTN       FU BLE US   FU V/Q scan  FU Echo   Continue therapeutic lovenox until PVL and V/Q scan negative   Continue home vitamins, supplements   Daily mag ox   FU BMP q12 hrs   Daily BMP, Mg   Check HbA1c   Nutrition, PT, OT        Case discussed with:  [x] Patient  [x]  Family ( In room with patient )    [x] Nursing  [] Case Management  DVT Prophylaxis:  [x] Lovenox  [] Hep SQ  [] SCDs  [] Coumadin   [] On Heparin gtt  Diet: Cardiac   Code Status: FULL   Disposition: Cont care in telemetry; >2 midnights    H. Clabe SealAdam Daymond Cordts, DO   May 16, 2018           Objective:   VS:   Visit Vitals  BP 137/78   Pulse 76   Temp 98.4 ??F (36.9 ??C)   Resp 17   Ht 5\' 3"  (1.6 m)   Wt 99.8 kg (220 lb)   SpO2 99%   BMI 38.97 kg/m??      Tmax/24hrs: Temp (24hrs), Avg:98.1 ??F (36.7 ??C), Min:97.6 ??F (36.4 ??C), Max:98.5 ??F (36.9 ??C)  IOBRIEFNo intake or output data in the 24 hours ending 05/16/18 1547    General:  Alert, cooperative, no acute distress   Cardiovascular: S1S2 - regular , No Murmur   Pulmonary: Equal expansion , No Use of  accessory muscles , No Rales No Rhonchi    GI:  +BS in all four quadrants, soft, non-tender  Extremities:  No edema; 2+ dorsalis pedis pulses bilaterally//calf edema   Neuro: Alert and oriented X 3      Medications:   Current Facility-Administered Medications   Medication Dose Route Frequency   ??? potassium chloride 10 mEq, lidocaine (PF) (XYLOCAINE) 10 mg/mL (1 %) 1 mL in 0.9% sodium chloride 100 mL IVPB   IntraVENous Q1H   ??? enoxaparin (LOVENOX) injection 100 mg  100 mg SubCUTAneous Q12H   ??? cholecalciferol (VITAMIN D3) capsule 5,000 Units  5,000 Units Oral DAILY   ??? cyanocobalamin tablet 1,000 mcg  1,000 mcg Oral DAILY   ??? lisinopril (PRINIVIL, ZESTRIL) tablet 40 mg  40 mg Oral DAILY   ??? pantoprazole (PROTONIX) tablet 40 mg  40 mg Oral ACB   ??? thiamine HCL (B-1) tablet 100 mg  100 mg Oral DAILY   ??? therapeutic multivitamin (THERAGRAN) tablet 1 Tab  1 Tab Oral DAILY   ??? acetaminophen (TYLENOL)  tablet 650 mg  650 mg Oral Q6H PRN   ??? naloxone (NARCAN) injection 0.4 mg  0.4 mg IntraVENous PRN   ??? ondansetron (ZOFRAN) injection 4 mg  4 mg IntraVENous Q6H PRN   ??? docusate sodium (COLACE) capsule 100 mg  100 mg Oral BID PRN   ??? influenza vaccine 2019-20 (6 mos+)(PF) (FLUARIX/FLULAVAL/FLUZONE QUAD) injection 0.5 mL  0.5 mL IntraMUSCular PRIOR TO DISCHARGE       Labs:    Recent Labs     05/16/18  0524 05/15/18  0223   WBC 5.3 9.0   HGB 9.9* 11.4*   HCT 31.4* 37.5   PLT 99* 120*     Recent Labs     05/16/18  1505 05/16/18  0524 05/15/18  1241 05/15/18  0223   NA 148* 146* 149* 144   K 2.3* 2.0* 2.4* 2.1*   CL 110 108 111 103   CO2 31 30 31  35*   GLU 134* 83 84 115*   BUN 3* 5* 5* 7   CREA 0.57* 0.51* 0.60 0.72   CA 7.8* 7.1* 7.5* 7.0*   MG  --  1.7  --  1.0*   PHOS  --  2.9  --   --    ALB  --   --   --  2.9*   SGOT  --   --   --  91*   ALT  --   --   --  68*

## 2018-05-17 LAB — BASIC METABOLIC PANEL
Anion Gap: 5 mmol/L (ref 3.0–18)
Anion Gap: 6 mmol/L (ref 3.0–18)
BUN: 4 MG/DL — ABNORMAL LOW (ref 7.0–18)
BUN: 4 MG/DL — ABNORMAL LOW (ref 7.0–18)
Bun/Cre Ratio: 9 — ABNORMAL LOW (ref 12–20)
Bun/Cre Ratio: 9 — ABNORMAL LOW (ref 12–20)
CO2: 30 mmol/L (ref 21–32)
CO2: 31 mmol/L (ref 21–32)
Calcium: 7.3 MG/DL — ABNORMAL LOW (ref 8.5–10.1)
Calcium: 7.7 MG/DL — ABNORMAL LOW (ref 8.5–10.1)
Chloride: 109 mmol/L (ref 100–111)
Chloride: 111 mmol/L (ref 100–111)
Creatinine: 0.45 MG/DL — ABNORMAL LOW (ref 0.6–1.3)
Creatinine: 0.47 MG/DL — ABNORMAL LOW (ref 0.6–1.3)
EGFR IF NonAfrican American: >60 mL/min/{1.73_m2} (ref >60)
EGFR IF NonAfrican American: >60 mL/min/{1.73_m2} (ref >60)
GFR African American: >60 mL/min/{1.73_m2} (ref >60)
GFR African American: >60 mL/min/{1.73_m2} (ref >60)
Glucose: 91 mg/dL (ref 74–99)
Glucose: 100 mg/dL — ABNORMAL HIGH (ref 74–99)
Potassium: 2.7 mmol/L — CL (ref 3.5–5.5)
Potassium: 2.8 mmol/L — CL (ref 3.5–5.5)
Sodium: 144 mmol/L (ref 136–145)
Sodium: 148 mmol/L — ABNORMAL HIGH (ref 136–145)

## 2018-05-17 LAB — CALCIUM, IONIZED
IONIZED CALCIUM: 0.97 MMOL/L — ABNORMAL LOW (ref 1.12–1.32)
Ionized Calcium: 0.97 MMOL/L — ABNORMAL LOW (ref 1.12–1.32)

## 2018-05-17 LAB — MAGNESIUM
Magnesium: 1.5 mg/dL — ABNORMAL LOW (ref 1.6–2.6)
Magnesium: 1.5 mg/dL — ABNORMAL LOW (ref 1.6–2.6)

## 2018-05-17 LAB — CBC WITH AUTO DIFFERENTIAL
Basophils %: 1 % (ref 0–2)
Basophils Absolute: 0 10*3/uL (ref 0.0–0.1)
Eosinophils %: 3 % (ref 0–5)
Eosinophils Absolute: 0.2 10*3/uL (ref 0.0–0.4)
Hematocrit: 32.8 % — ABNORMAL LOW (ref 35.0–45.0)
Hemoglobin: 10.1 g/dL — ABNORMAL LOW (ref 12.0–16.0)
Lymphocytes %: 37 % (ref 21–52)
Lymphocytes Absolute: 2.2 10*3/uL (ref 0.9–3.6)
MCH: 26.5 PG (ref 24.0–34.0)
MCHC: 30.8 g/dL — ABNORMAL LOW (ref 31.0–37.0)
MCV: 86.1 FL (ref 74.0–97.0)
Monocytes %: 8 % (ref 3–10)
Monocytes Absolute: 0.5 10*3/uL (ref 0.05–1.2)
Neutrophils %: 51 % (ref 40–73)
Neutrophils Absolute: 3.2 10*3/uL (ref 1.8–8.0)
Platelets: 107 10*3/uL — ABNORMAL LOW (ref 135–420)
RBC: 3.81 M/uL — ABNORMAL LOW (ref 4.20–5.30)
RDW: 16.1 % — ABNORMAL HIGH (ref 11.6–14.5)
WBC: 6 10*3/uL (ref 4.6–13.2)

## 2018-05-17 LAB — METABOLIC PANEL, BASIC
Anion gap: 5 mmol/L (ref 3.0–18)
Anion gap: 6 mmol/L (ref 3.0–18)
BUN/Creatinine ratio: 9 — ABNORMAL LOW (ref 12–20)
BUN/Creatinine ratio: 9 — ABNORMAL LOW (ref 12–20)
BUN: 4 MG/DL — ABNORMAL LOW (ref 7.0–18)
BUN: 4 MG/DL — ABNORMAL LOW (ref 7.0–18)
CO2: 30 mmol/L (ref 21–32)
CO2: 31 mmol/L (ref 21–32)
Calcium: 7.3 MG/DL — ABNORMAL LOW (ref 8.5–10.1)
Calcium: 7.7 MG/DL — ABNORMAL LOW (ref 8.5–10.1)
Chloride: 109 mmol/L (ref 100–111)
Chloride: 111 mmol/L (ref 100–111)
Creatinine: 0.45 MG/DL — ABNORMAL LOW (ref 0.6–1.3)
Creatinine: 0.47 MG/DL — ABNORMAL LOW (ref 0.6–1.3)
GFR est AA: >60 mL/min/{1.73_m2} (ref >60)
GFR est AA: >60 mL/min/{1.73_m2} (ref >60)
GFR est non-AA: >60 mL/min/{1.73_m2} (ref >60)
GFR est non-AA: >60 mL/min/{1.73_m2} (ref >60)
Glucose: 91 mg/dL (ref 74–99)
Glucose: 100 mg/dL — ABNORMAL HIGH (ref 74–99)
Potassium: 2.7 mmol/L — CL (ref 3.5–5.5)
Potassium: 2.8 mmol/L — CL (ref 3.5–5.5)
Sodium: 144 mmol/L (ref 136–145)
Sodium: 148 mmol/L — ABNORMAL HIGH (ref 136–145)

## 2018-05-17 LAB — CBC WITH AUTOMATED DIFF
ABS. BASOPHILS: 0 10*3/uL (ref 0.0–0.1)
ABS. EOSINOPHILS: 0.2 10*3/uL (ref 0.0–0.4)
ABS. LYMPHOCYTES: 2.2 10*3/uL (ref 0.9–3.6)
ABS. MONOCYTES: 0.5 10*3/uL (ref 0.05–1.2)
ABS. NEUTROPHILS: 3.2 10*3/uL (ref 1.8–8.0)
BASOPHILS: 1 % (ref 0–2)
EOSINOPHILS: 3 % (ref 0–5)
HCT: 32.8 % — ABNORMAL LOW (ref 35.0–45.0)
HGB: 10.1 g/dL — ABNORMAL LOW (ref 12.0–16.0)
LYMPHOCYTES: 37 % (ref 21–52)
MCH: 26.5 PG (ref 24.0–34.0)
MCHC: 30.8 g/dL — ABNORMAL LOW (ref 31.0–37.0)
MCV: 86.1 FL (ref 74.0–97.0)
MONOCYTES: 8 % (ref 3–10)
NEUTROPHILS: 51 % (ref 40–73)
PLATELET: 107 10*3/uL — ABNORMAL LOW (ref 135–420)
RBC: 3.81 M/uL — ABNORMAL LOW (ref 4.20–5.30)
RDW: 16.1 % — ABNORMAL HIGH (ref 11.6–14.5)
WBC: 6 10*3/uL (ref 4.6–13.2)

## 2018-05-17 MED ORDER — POTASSIUM CHLORIDE 2 MEQ/ML IV SOLN
2 mEq/mL | INTRAVENOUS | Status: AC
Start: 2018-05-17 — End: 2018-05-17
  Administered 2018-05-17 (×5): via INTRAVENOUS

## 2018-05-17 MED ORDER — ENOXAPARIN 120 MG/0.8 ML SUB-Q SYRINGE
120 mg/0.8 mL | Freq: Two times a day (BID) | SUBCUTANEOUS | Status: DC
Start: 2018-05-17 — End: 2018-05-17

## 2018-05-17 MED ORDER — SODIUM CHLORIDE 0.9 % IV
10010 mg/mL (10%) | Freq: Once | INTRAVENOUS | Status: AC
Start: 2018-05-17 — End: 2018-05-17
  Administered 2018-05-17: 23:00:00 via INTRAVENOUS

## 2018-05-17 MED ORDER — ENOXAPARIN 40 MG/0.4 ML SUB-Q SYRINGE
40 mg/0.4 mL | SUBCUTANEOUS | Status: DC
Start: 2018-05-17 — End: 2018-05-22
  Administered 2018-05-17 – 2018-05-21 (×5): via SUBCUTANEOUS

## 2018-05-17 MED ORDER — POTASSIUM BICARBONATE-CITRIC ACID 20 MEQ EFFERVESCENT TABLET
20 mEq | Freq: Two times a day (BID) | ORAL | Status: DC
Start: 2018-05-17 — End: 2018-05-18
  Administered 2018-05-17 – 2018-05-18 (×4): via ORAL

## 2018-05-17 MED ORDER — THERAPEUTIC MULTIVITAMIN TAB
Freq: Two times a day (BID) | ORAL | Status: DC
Start: 2018-05-17 — End: 2018-05-22
  Administered 2018-05-18 – 2018-05-22 (×10): via ORAL

## 2018-05-17 MED ORDER — MAGNESIUM SULFATE 2 GRAM/50 ML IVPB
2 gram/50 mL (4 %) | Freq: Once | INTRAVENOUS | Status: AC
Start: 2018-05-17 — End: 2018-05-17
  Administered 2018-05-17: 15:00:00 via INTRAVENOUS

## 2018-05-17 MED FILL — EFFER-K 20 MEQ EFFERVESCENT TABLET: 20 mEq | ORAL | Qty: 1

## 2018-05-17 MED FILL — POTASSIUM CHLORIDE 2 MEQ/ML IV SOLN: 2 mEq/mL | INTRAVENOUS | Qty: 5

## 2018-05-17 MED FILL — MAGNESIUM OXIDE 400 MG TAB: 400 mg | ORAL | Qty: 1

## 2018-05-17 MED FILL — MAGNESIUM SULFATE 2 GRAM/50 ML IVPB: 2 gram/50 mL (4 %) | INTRAVENOUS | Qty: 50

## 2018-05-17 MED FILL — LOVENOX 100 MG/ML SUBCUTANEOUS SYRINGE: 100 mg/mL | SUBCUTANEOUS | Qty: 1

## 2018-05-17 MED FILL — THIAMINE HCL 100 MG TAB: 100 mg | ORAL | Qty: 1

## 2018-05-17 MED FILL — PANTOPRAZOLE 40 MG TAB, DELAYED RELEASE: 40 mg | ORAL | Qty: 1

## 2018-05-17 MED FILL — CHOLECALCIFEROL (VITAMIN D3) 5,000 UNIT CAP: ORAL | Qty: 1

## 2018-05-17 MED FILL — SODIUM CHLORIDE 0.45 % IV: 0.45 % | INTRAVENOUS | Qty: 1000

## 2018-05-17 MED FILL — CYANOCOBALAMIN 1,000 MCG TAB: 1000 mcg | ORAL | Qty: 1

## 2018-05-17 MED FILL — LOVENOX 40 MG/0.4 ML SUBCUTANEOUS SYRINGE: 40 mg/0.4 mL | SUBCUTANEOUS | Qty: 0.4

## 2018-05-17 MED FILL — THERAPEUTIC MULTIVITAMIN TAB: ORAL | Qty: 1

## 2018-05-17 MED FILL — CALCIUM GLUCONATE 100 MG/ML (10%) IV SOLN: 100 mg/mL (10%) | INTRAVENOUS | Qty: 20

## 2018-05-17 MED FILL — LISINOPRIL 40 MG TAB: 40 mg | ORAL | Qty: 1

## 2018-05-17 NOTE — Progress Notes (Signed)
Problem: Falls - Risk of  Goal: *Absence of Falls  Description  Document Schmid Fall Risk and appropriate interventions in the flowsheet.  Outcome: Progressing Towards Goal  Note: Fall Risk Interventions:            Medication Interventions: Evaluate medications/consider consulting pharmacy                   Problem: Patient Education: Go to Patient Education Activity  Goal: Patient/Family Education  Outcome: Progressing Towards Goal     Problem: Pressure Injury - Risk of  Goal: *Prevention of pressure injury  Description  Document Braden Scale and appropriate interventions in the flowsheet.  Outcome: Progressing Towards Goal  Note: Pressure Injury Interventions:       Moisture Interventions: Absorbent underpads    Activity Interventions: Pressure redistribution bed/mattress(bed type)    Mobility Interventions: Pressure redistribution bed/mattress (bed type)    Nutrition Interventions: Document food/fluid/supplement intake                     Problem: Patient Education: Go to Patient Education Activity  Goal: Patient/Family Education  Outcome: Progressing Towards Goal     Problem: Pain  Goal: *Control of Pain  Outcome: Progressing Towards Goal     Problem: Patient Education: Go to Patient Education Activity  Goal: Patient/Family Education  Outcome: Progressing Towards Goal

## 2018-05-17 NOTE — Other (Signed)
1915 Assumed care of patient from Tammy RN (off going). Patient resting in bed with no distress. Bed in lowest position, side rails up x3, call bell and personal belongings within reach. Will continue to monitor.       0715 Bedside shift change report given to Sherrilyn RistKari RN (oncoming nurse) by Janine LimboKiara RN (offgoing nurse). Report included the following information SBAR, Kardex, Intake/Output, MAR, Recent Results and Cardiac Rhythm Telemetry box #81, NSR on monitor.

## 2018-05-17 NOTE — Progress Notes (Signed)
[facility] Hospitalist Group  Progress Note    Patient: Cheryl Paul Age: 67 y.o. DOB: April 19, 1951 MR#: [id number] SSN: [id number]  Date/Time: 05/17/2018     Subjective:     Sitting in chair   Comfortable   C/o of numbness, tingling and pain from feet to thighs   Also c/o upper arm pain, leg cramps and calf tenderness  No dizziness, CP, SOB, cough, n/v/d     Assessment/Plan:     1. Syncope- Likely 2/2 vasovagal and multiple electrolytes abnormalities  2. Severe hypokalemia  3. Severe hypomagnesemia  4. Hypocalcemia   5. Hypoalbuminemia   6. Diarrhea  7. Short-gut syndrome   8. S/p gastric bypass   9. Elevated troponin- likely 2/2 demand ischemia in setting of syncope and severe electrolyte abnormalities   10. Morbid obesity-status post gastric bypass surgery-currently on nutritional supplements  11. Hx Type II DM - HbA1c 5.3 on 05/16/18; not on any medications  12. Anemia-of chronic illness-rule out deficiency disorder-follow iron profile and B12 level  13. HTN     Continue electrolyte replacement   Check ionized Ca   BLE US, V/Q scan and Echo reviewed    Continue home vitamins, supplements   Daily mag ox   FU BMP q12 hrs   Daily BMP, Mg   Nutrition, PT, OT        Case discussed with:  [x] Patient  [x]  Family ( In room with patient )    [x] Nursing  [x] Case Management  DVT Prophylaxis:  [x] Lovenox  [] Hep SQ  [] SCDs  [] Coumadin   [] On Heparin gtt  Diet: Cardiac   Code Status: FULL   Disposition: Cont care in telemetry; >2 midnights    H. Clabe SealAdam Britanny Marksberry, DO   May 17, 2018           Objective:   VS:   Visit Vitals  BP 146/90 (BP 1 Location: Left arm, BP Patient Position: At rest)   Pulse 73   Temp 97.4 ??F (36.3 ??C)   Resp 20   Ht 5\' 3"  (1.6 m)   Wt 106.9 kg (235 lb 11.2 oz)   SpO2 98%   BMI 41.75 kg/m??      Tmax/24hrs: Temp (24hrs), Avg:98.3 ??F (36.8 ??C), Min:97.4 ??F (36.3 ??C), Max:98.8 ??F (37.1 ??C)  IOBRIEF    Intake/Output Summary (Last 24 hours) at 05/17/2018 1501   Last data filed at 05/17/2018 0659  Gross per 24 hour   Intake 1198.33 ml   Output ???   Net 1198.33 ml       General:  Alert, cooperative, no acute distress   Cardiovascular: S1S2 - regular , No Murmur   Pulmonary: Equal expansion , No Use of accessory muscles , No Rales No Rhonchi    GI:  +BS in all four quadrants, soft, non-tender  Extremities:  No edema; 2+ dorsalis pedis pulses bilaterally//calf edema   Neuro: Alert and oriented X 3      Medications:   Current Facility-Administered Medications   Medication Dose Route Frequency   ??? potassium bicarb-citric acid (EFFER-K) tablet 20 mEq  20 mEq Oral BID   ??? calcium gluconate 2 g in 0.9% sodium chloride 100 mL IVPB  2 g IntraVENous ONCE   ??? enoxaparin (LOVENOX) injection 110 mg  110 mg SubCUTAneous Q12H   ??? 0.45% sodium chloride 1,000 mL with potassium chloride 30 mEq infusion   IntraVENous CONTINUOUS   ??? magnesium oxide (MAG-OX) tablet 400 mg  400 mg Oral  DAILY   ??? cholecalciferol (VITAMIN D3) capsule 5,000 Units  5,000 Units Oral DAILY   ??? cyanocobalamin tablet 1,000 mcg  1,000 mcg Oral DAILY   ??? lisinopril (PRINIVIL, ZESTRIL) tablet 40 mg  40 mg Oral DAILY   ??? pantoprazole (PROTONIX) tablet 40 mg  40 mg Oral ACB   ??? thiamine HCL (B-1) tablet 100 mg  100 mg Oral DAILY   ??? therapeutic multivitamin (THERAGRAN) tablet 1 Tab  1 Tab Oral DAILY   ??? acetaminophen (TYLENOL) tablet 650 mg  650 mg Oral Q6H PRN   ??? naloxone (NARCAN) injection 0.4 mg  0.4 mg IntraVENous PRN   ??? ondansetron (ZOFRAN) injection 4 mg  4 mg IntraVENous Q6H PRN   ??? docusate sodium (COLACE) capsule 100 mg  100 mg Oral BID PRN   ??? influenza vaccine 2019-20 (6 mos+)(PF) (FLUARIX/FLULAVAL/FLUZONE QUAD) injection 0.5 mL  0.5 mL IntraMUSCular PRIOR TO DISCHARGE       Labs:    Recent Labs     05/17/18  0516 05/16/18  0524 05/15/18  0223   WBC 6.0 5.3 9.0   HGB 10.1* 9.9* 11.4*   HCT 32.8* 31.4* 37.5   PLT 107* 99* 120*     Recent Labs     05/17/18  1127 05/17/18  0516 05/16/18  1505 05/16/18   0524  05/15/18  0223   NA 148* 144 148* 146*   < > 144   K 2.8* 2.7* 2.3* 2.0*   < > 2.1*   CL 111 109 110 108   < > 103   CO2 31 30 31 30    < > 35*   GLU 100* 91 134* 83   < > 115*   BUN 4* 4* 3* 5*   < > 7   CREA 0.47* 0.45* 0.57* 0.51*   < > 0.72   CA 7.7* 7.3* 7.8* 7.1*   < > 7.0*   MG  --  1.5*  --  1.7  --  1.0*   PHOS  --   --   --  2.9  --   --    ALB  --   --   --   --   --  2.9*   SGOT  --   --   --   --   --  91*   ALT  --   --   --   --   --  68*    < > = values in this interval not displayed.

## 2018-05-17 NOTE — Progress Notes (Signed)
PHYSICAL THERAPY EVALUATION AND DISCHARGE    Patient: Cheryl Paul ([age] y.o. female)  Date: 05/17/2018  Primary Diagnosis: Hypokalemia [E87.6]  Syncope [R55]  Hypomagnesemia [E83.42]        Precautions:   Fall  PLOF: Pt states she is mod(I) with ADL's. Uses RW inside home and rollator in community. She lives with her daughter    ASSESSMENT : Patient received standing next to bed, returning from using restroom. Pt is agreeable to PT evaluation/treatment. She is mod(I) with transfers and ambulates with RW in hallway. Pt would benefit from OP PT to improve LE strength and to further educate on home/gym program for transition to independent exercise program. Patient does not require further skilled intervention in the acute care setting. Educated pt to use call bell for supervision/assist to bathroom as she does not have her RW inside room.         PLAN :  Recommendations and Planned Interventions:   No formal PT needs identified at this time.  Discharge Recommendations: OP PT  Further Equipment Recommendations for Discharge: has all necessary DME      SUBJECTIVE:   Patient stated ???I went to the bathroom .???    OBJECTIVE DATA SUMMARY:     Past Medical History:   Diagnosis Date    Arthritis     knee, arms,     Asthma     Chronic obstructive pulmonary disease (HCC)     Chronic pain     Diabetes (HCC)     H/O sinusitis     Hypercholesterolemia     Hypertension     Sleep apnea     NOT USING     Stress incontinence     Toe fracture, left August 2015    4th toe     Past Surgical History:   Procedure Laterality Date    ABDOMEN SURGERY PROC UNLISTED      4 surguries for blockages    HX CARPAL TUNNEL RELEASE Bilateral     HX CHOLECYSTECTOMY      HX GYN      HX HERNIA REPAIR      abdomen    HX HYSTERECTOMY      tubes tied    HX LAP GASTRIC BYPASS  02/21/2018    HX ORTHOPAEDIC      catrpal tunnel right and left    HX POLYPECTOMY  04/18/14     Barriers to Learning/Limitations: None  Compensate with: N/A  Home Situation:    Home Situation  Home Environment: Private residence  # Steps to Enter: 2  One/Two Story Residence: One story  Living Alone: No  Support Systems: Family member(s)  Patient Expects to be Discharged to:: Private residence  Current DME Used/Available at Home: Raised toilet seat, Paediatric nursehower chair, Environmental consultantWalker, rollator       Strength:    Strength: Generally decreased, functional    Tone & Sensation:   Tone: Normal  Sensation: Intact       Range Of Motion:  AROM: Within functional limits    Functional Mobility:    Transfers:  Sit to Stand: Supervision  Stand to Sit: Modified independent       Balance:   Sitting: Intact  Standing: Intact;With support(RW good balance)       Ambulation/Gait Training:  Distance (ft): 80 Feet (ft)  Assistive Device: Walker, rolling  Ambulation - Level of Assistance: Supervision  Gait Description (WDL): Exceptions to WDL  Gait Abnormalities: Trunk sway increased  Speed/Cadence: Slow  Step Length: Right shortened;Left shortened         Activity Tolerance:   Good  Please refer to the flowsheet for vital signs taken during this treatment.  After treatment:   [x]          Patient left in no apparent distress sitting EOB  []          Patient left in no apparent distress in bed  [x]          Call bell left within reach  [x]          Nursing notified  []          Caregiver present  []          Bed alarm activated  []          SCDs applied    COMMUNICATION/EDUCATION:   [x]          Role of Physical Therapy in the acute care setting.  [x]          Fall prevention education was provided and the patient/caregiver indicated understanding.  []          Patient/family have participated as able in goal setting and plan of care.  []          Patient/family agree to work toward stated goals and plan of care.  []          Patient understands intent and goals of therapy, but is neutral about his/her participation.  []          Patient is unable to participate in goal setting/plan of care: ongoing with therapy staff.   []          Other:    Thank you for this referral.  Pablo LawrenceJessica M Regan Llorente, PT   Time Calculation: 9 mins      Eval Complexity: History: LOW Complexity : Zero comorbidities / personal factors that will impact the outcome / POCExam:LOW Complexity : 1-2 Standardized tests and measures addressing body structure, function, activity limitation and / or participation in recreation  Presentation: LOW Complexity : Stable, uncomplicated  Clinical Decision Making:Low Complexity    Overall Complexity:LOW

## 2018-05-17 NOTE — Progress Notes (Signed)
OCCUPATIONAL THERAPY EVALUATION/DISCHARGE    Patient: Cheryl Paul ([age] y.o. female)  Date: 05/17/2018  Primary Diagnosis: Hypokalemia [E87.6]  Syncope [R55]  Hypomagnesemia [E83.42]        Precautions:   Fall  PLOF: Pt was independent with basic self care tasks and used a RW/rollator for functional mobility PTA.     ASSESSMENT AND RECOMMENDATIONS:  Based on the objective data described below, the patient is able to perform basic self care tasks and functional transfers without assistance with use of a RW.  She has a supportive daughter at home to assist her prn and all needed DME (shower seat, RW, rollator) for home safety.    Skilled occupational therapy is not indicated at this time.  Discharge Recommendations: None  Further Equipment Recommendations for Discharge: N/A      SUBJECTIVE:   Patient stated ???I need to use the bathroom.???    OBJECTIVE DATA SUMMARY:     Past Medical History:   Diagnosis Date    Arthritis     knee, arms,     Asthma     Chronic obstructive pulmonary disease (HCC)     Chronic pain     Diabetes (HCC)     H/O sinusitis     Hypercholesterolemia     Hypertension     Sleep apnea     NOT USING     Stress incontinence     Toe fracture, left August 2015    4th toe     Past Surgical History:   Procedure Laterality Date    ABDOMEN SURGERY PROC UNLISTED      4 surguries for blockages    HX CARPAL TUNNEL RELEASE Bilateral     HX CHOLECYSTECTOMY      HX GYN      HX HERNIA REPAIR      abdomen    HX HYSTERECTOMY      tubes tied    HX LAP GASTRIC BYPASS  02/21/2018    HX ORTHOPAEDIC      catrpal tunnel right and left    HX POLYPECTOMY  04/18/14     Barriers to Learning/Limitations: None  Compensate with: visual, verbal, tactile, kinesthetic cues/model    Home Situation:   Home Situation  Home Environment: Private residence  # Steps to Enter: 2  One/Two Story Residence: One story  Living Alone: No  Support Systems: Family member(s)  Patient Expects to be Discharged to:: Private residence   Current DME Used/Available at Home: Raised toilet seat, Paediatric nursehower chair, Environmental consultantWalker, rollator  Tub or Shower Type: Shower(with seat)  [x]      Right hand dominant   []      Left hand dominant    Cognitive/Behavioral Status:  Neurologic State: Alert  Orientation Level: Oriented X4  Cognition: Appropriate decision making;Follows commands  Safety/Judgement: Awareness of environment;Fall prevention    Skin: Intact on UEs  Edema: None noted in UEs    Vision/Perceptual:    Acuity: Within Defined Limits      Coordination: BUE  Coordination: Within functional limits  Fine Motor Skills-Upper: Left Intact;Right Intact    Gross Motor Skills-Upper: Left Intact;Right Intact    Balance:  Sitting: Intact  Standing: Intact;With support(RW good balance)    Strength: BUE  Strength: Within functional limits    Tone & Sensation: BUE  Tone: Normal  Sensation: Intact    Range of Motion: BUE  AROM: Within functional limits    Functional Mobility and Transfers for ADLs:  Transfers:  Sit to  Stand: Supervision  Stand to Sit: Modified independent   Toilet Transfer : Modified independent    ADL Assessment:  Feeding: Independent    Oral Facial Hygiene/Grooming: Independent    Bathing: Modified independent    Upper Body Dressing: Independent    Lower Body Dressing: Modified independent    Toileting: Modified independent    ADL Intervention:  Patient ambulated to the bathroom without assistance for toileting.  She was able to manage her clothing and perform hygiene with modified independence.  No SOB noted or LOB.    Cognitive Retraining  Safety/Judgement: Awareness of environment;Fall prevention    Pain:  Pain level pre-treatment: 7/10, muscle aching in back of arms and legs   Pain level post-treatment: 7/10, muscle aching in back of arms and legs   Pain Intervention(s): Medication (see MAR); Rest  Response to intervention: Nurse notified, See doc flow    Activity Tolerance:   Good   Please refer to the flowsheet for vital signs taken during this treatment.  After treatment:   [x]   Patient left in no apparent distress sitting up in chair  []   Patient left in no apparent distress in bed  [x]   Call bell left within reach  [x]   Nursing notified  []   Caregiver present  []   Bed alarm activated    COMMUNICATION/EDUCATION:   [x]       Role of Occupational Therapy in the acute care setting  [x]       Home safety education was provided and the patient/caregiver indicated understanding.  [x]       Patient/family have participated as able and agree with findings and recommendations.  []       Patient is unable to participate in plan of care at this time.    Thank you for this referral.  Peggye FormAshley DaVanzo, MS OTR/L   Time Calculation: 24 mins      Eval Complexity: History: LOW Complexity : Brief history review ;   Examination: LOW Complexity : 1-3 performance deficits relating to physical, cognitive , or psychosocial skils that result in activity limitations and / or participation restrictions ;   Decision Making:LOW Complexity : No comorbidities that affect functional and no verbal or physical assistance needed to complete eval tasks

## 2018-05-17 NOTE — Progress Notes (Addendum)
NUTRITION    Nutrition Consult: General Nutrition Management & Supplements     RECOMMENDATIONS / PLAN:     - Change to bariatric maintenance diet.  - Add supplements: Gelatein TID.  - Change MVI to BID.  - Continue to monitor and replace electrolytes as needed.  - Continue RD inpatient monitoring and evaluation.     NUTRITION INTERVENTIONS & DIAGNOSIS:     - Meals/snacks: modify composition  - Medical food supplement therapy: initiate  - IV fluids 1/2 NS + 30 mEq KCL at 100 mL/hr  - Vitamin and mineral supplement therapy: 2 gm Ca, cholecalciferol, cyanocobalamin, mag-ox, 20 mEq K Bicarb, 50 mEq KCL, MVI, thiamine   - Collaboration and referral of nutrition care: interdisciplinary rounds, verbal order from Dr. Rennis PettyHerron for nutrition consult    Nutrition Diagnosis: Impaired nutrient utilization related to altered GI structure as evidneced by s/p gastric bypass surgery with multiple electrolyte abnormalities.     ASSESSMENT:     Pt s/p gastric bypass in October 2019 with c/o diarrhea and multiple electrolyte abnormalities, being repleted. Pt admits to non-compliance with protein drinks/supplements (states premier protein too sweet) but does take daily vitamins. Currently on cardiac diet with fair intake, noted multiple sugar packets opened at bedside. Education provided on dumping syndrome. Encouraged pt to read nutritional labels and being aware of sugar content of foods/drinks. K and Ca replaced.     Nutritional intake adequate to meet patients estimated nutritional needs:  No    Diet: DIET CARDIAC Regular      Food Allergies: lactose  Current Appetite: Good  Appetite/meal intake prior to admission: inadequate; not compliant with protein supplements; 1-2 meals/day  Feeding Limitations:  []  Swallowing difficulty    []  Chewing difficulty    []  Other:  Current Meal Intake:   Patient Vitals for the past 100 hrs:   % Diet Eaten   05/16/18 1555 85 %   05/16/18 0850 100 %       BM: 12/30  Skin Integrity: WDL   Edema:   [x]  No     []  Yes   Pertinent Medications: Reviewed: ondansetron, pantoprazole    Recent Labs     05/17/18  1127 05/17/18  0516 05/16/18  1505 05/16/18  0524  05/15/18  0223   NA 148* 144 148* 146*   < > 144   K 2.8* 2.7* 2.3* 2.0*   < > 2.1*   CL 111 109 110 108   < > 103   CO2 31 30 31 30    < > 35*   GLU 100* 91 134* 83   < > 115*   BUN 4* 4* 3* 5*   < > 7   CREA 0.47* 0.45* 0.57* 0.51*   < > 0.72   CA 7.7* 7.3* 7.8* 7.1*   < > 7.0*   MG  --  1.5*  --  1.7  --  1.0*   PHOS  --   --   --  2.9  --   --    ALB  --   --   --   --   --  2.9*   SGOT  --   --   --   --   --  91*   ALT  --   --   --   --   --  68*    < > = values in this interval not displayed.       Intake/Output Summary (Last 24  hours) at 05/17/2018 1300  Last data filed at 05/17/2018 0659  Gross per 24 hour   Intake 1198.33 ml   Output ???   Net 1198.33 ml       Anthropometrics:  Ht Readings from Last 1 Encounters:   05/16/18 5\' 3"  (1.6 m)     Last 3 Recorded Weights in this Encounter    05/16/18 0437 05/16/18 1253 05/17/18 0344   Weight: 100.1 kg (220 lb 11.2 oz) 99.8 kg (220 lb) 106.9 kg (235 lb 11.2 oz)     Body mass index is 41.75 kg/m??.       Weight History: -38#, 13.9% x 3 months PTA (intentional s/p gastric bypass October 2019)    Weight Metrics 05/17/2018 03/09/2018 03/02/2018 02/21/2018 02/18/2018 02/08/2018 12/17/2017   Weight 235 lb 11.2 oz 258 lb 269 lb - 273 lb 273 lb 274 lb   BMI 41.75 kg/m2 45.7 kg/m2 47.65 kg/m2 48.36 kg/m2 - 48.36 kg/m2 48.54 kg/m2        Admitting Diagnosis: Hypokalemia [E87.6]  Syncope [R55]  Hypomagnesemia [E83.42]  Pertinent PMHx: COPD, DM, hypercholesterolemia, HTN, lap gastric bypass    Education Needs:        [x]  None identified  []  Identified - Not appropriate at this time  []   Identified and addressed - refer to education log  Learning Limitations:   [x]  None identified  []  Identified    Cultural, religious & ethnic food preferences:  [x]  None identified    []  Identified and addressed      ESTIMATED NUTRITION NEEDS:     Calories: 1579-1895 kcal (MSJx1-1.2) based on  [x]  Actual BW: 107     []  IBW   Protein: 86-107 gm (0.8-1 gm/kg) based on  [x]  Actual BW      []  IBW   Fluid: 1 mL/kcal     MONITORING & EVALUATION:     Nutrition Goal(s):   - PO nutrition intake will meet >75% of patient estimated nutritional needs within the next 7 days.   Outcome: New/Initial goal     Monitoring:   [x]  Food and nutrient intake   [x]  Food and nutrient administration  [x]  Comparative standards   [x]  Nutrition-focused physical findings   [x]  Anthropometric Measurements   [x]  Treatment/therapy   [x]  Biochemical data, medical tests, and procedures        Previous Recommendations (for follow-up assessments only):  Not Applicable     Discharge Planning: bariatric maintenance diet + 0 gm sugar nutritional/protein supplement   Participated in care planning, discharge planning, & interdisciplinary rounds as appropriate.      De HollingsheadMegan M Schorr, RD  Pager: [phone]

## 2018-05-17 NOTE — Other (Signed)
Bedside shift change report given to Karen, RN (oncoming nurse) by Kari, RN (offgoing nurse). Report included the following information SBAR, Kardex, Intake/Output and Recent Results.

## 2018-05-17 NOTE — Progress Notes (Signed)
Patient stated that she took her leads off because she was itching. Redness and hives noted at lead sites

## 2018-05-17 NOTE — Progress Notes (Signed)
Reason for Admission:   Hypokalemia [E87.6]  Syncope [R55]  Hypomagnesemia [E83.42]               RRAT Score:     21             Resources/supports as identified by patient/family:       Top Challenges facing patient (as identified by patient/family and CM):         Finances/Medication cost?     No needs identified  Transportation      Patient still drives  Support system or lack thereof?   family  Living arrangements?        Lives alone   Self-care/ADLs/Cognition?   Alert and oriented        Current Advanced Directive/Advance Care Plan:   no                          Plan for utilizing home health:    no                      Likelihood of readmission:   HIGH    Transition of Care Plan:                    Initial assessment completed with patient. Cognitive status of patient: oriented to time, place, person and situation.     Face sheet information confirmed:  yes.  The patient designates daughter to participate in her discharge plan and to receive any needed information. This patient lives in a single family home with patient.  Patient is able to navigate steps as needed.  Prior to, patient was considered to be independent with ADLs/IADLS : yes .     Patient has a current ACP document on file: no  The patient and daughter will be available to transport patient home upon discharge.   The patient already has Walker, Rollator, Shower chair, BSC,  medical equipment available in the home.     Patient is not currently active with home health.  Patient has not stayed in a skilled nursing facility or rehab.  Was  stay within last 60 days : no.      This patient is on dialysis :no     Freedom of choice signed: no. Currently, the discharge plan is Home.    The patient states that she can obtain her medications from the pharmacy, and take her medications as directed.    Patient's current insurance is Medicare.               Colette Pappal, RN BSN  Care Manager  [phone]

## 2018-05-17 NOTE — Progress Notes (Signed)
Patient stated that she took her leads off because she was itching. Redness and hives noted at lead sites

## 2018-05-17 NOTE — Progress Notes (Signed)
Reason for Admission:   Hypokalemia [E87.6]  Syncope [R55]  Hypomagnesemia [E83.42]               RRAT Score:     21             Resources/supports as identified by patient/family:       Top Challenges facing patient (as identified by patient/family and CM):         Finances/Medication cost?     No needs identified  Transportation      Patient still drives  Support system or lack thereof?   family  Living arrangements?        Lives alone   Self-care/ADLs/Cognition?   Alert and oriented        Current Advanced Directive/Advance Care Plan:   no                          Plan for utilizing home health:    no                      Likelihood of readmission:   HIGH    Transition of Care Plan:                    Initial assessment completed with patient. Cognitive status of patient: oriented to time, place, person and situation.     Face sheet information confirmed:  yes.  The patient designates daughter to participate in her discharge plan and to receive any needed information. This patient lives in a single family home with patient.  Patient is able to navigate steps as needed.  Prior to, patient was considered to be independent with ADLs/IADLS : yes .     Patient has a current ACP document on file: no  The patient and daughter will be available to transport patient home upon discharge.   The patient already has Dan HumphreysWalker, Occupational hygienistollator, Paediatric nursehower chair, [facility],  [facility] equipment available in the home.     Patient is not currently active with home health.  Patient has not stayed in a skilled nursing facility or rehab.  Was  stay within last 60 days : no.      This patient is on dialysis :no     Freedom of choice signed: no. Currently, the discharge plan is Home.    The patient states that she can obtain her medications from the pharmacy, and take her medications as directed.    Patient's current insurance is Medicare.               Rocky Linkolette Pappal, RN BSN  Care Manager  [phone]

## 2018-05-17 NOTE — Progress Notes (Signed)
[facility]  Progress Note    Patient: Cheryl Paul Age: 67 y.o. DOB: 1951-01-31 MR#: [id number] SSN: [id number]  Date/Time: 05/17/2018     Subjective:     Sitting in chair   Comfortable   C/o of numbness, tingling and pain from feet to thighs   Also c/o upper arm pain, leg cramps and calf tenderness  No dizziness, CP, SOB, cough, n/v/d     Assessment/Plan:     1. Syncope- Likely 2/2 vasovagal and multiple electrolytes abnormalities  2. Severe hypokalemia  3. Severe hypomagnesemia  4. Hypocalcemia   5. Hypoalbuminemia   6. Diarrhea  7. Short-gut syndrome   8. S/p gastric bypass   9. Elevated troponin- likely 2/2 demand ischemia in setting of syncope and severe electrolyte abnormalities   10. Morbid obesity-status post gastric bypass surgery-currently on nutritional supplements  11. Hx Type II DM - HbA1c 5.3 on 05/16/18; not on any medications  12. Anemia-of chronic illness-rule out deficiency disorder-follow iron profile and B12 level  13. HTN     Continue electrolyte replacement   Check ionized Ca   BLE US, V/Q scan and Echo reviewed    Continue home vitamins, supplements   Daily mag ox   FU BMP q12 hrs   Daily BMP, Mg   Nutrition, PT, OT        Case discussed with:  [x] Patient  [x]  Family ( In room with patient )    [x] Nursing  [x] Case Management  DVT Prophylaxis:  [x] Lovenox  [] Hep SQ  [] SCDs  [] Coumadin   [] On Heparin gtt  Diet: Cardiac   Code Status: FULL   Disposition: Cont care in telemetry; >2 midnights    H. Clabe SealAdam Nyia Tsao, DO   May 17, 2018           Objective:   VS:   Visit Vitals  BP 146/90 (BP 1 Location: Left arm, BP Patient Position: At rest)   Pulse 73   Temp 97.4 ??F (36.3 ??C)   Resp 20   Ht 5\' 3"  (1.6 m)   Wt 106.9 kg (235 lb 11.2 oz)   SpO2 98%   BMI 41.75 kg/m??      Tmax/24hrs: Temp (24hrs), Avg:98.3 ??F (36.8 ??C), Min:97.4 ??F (36.3 ??C), Max:98.8 ??F (37.1 ??C)  IOBRIEF    Intake/Output Summary (Last 24 hours) at 05/17/2018 1501  Last data filed at  05/17/2018 0659  Gross per 24 hour   Intake 1198.33 ml   Output ???   Net 1198.33 ml       General:  Alert, cooperative, no acute distress   Cardiovascular: S1S2 - regular , No Murmur   Pulmonary: Equal expansion , No Use of accessory muscles , No Rales No Rhonchi    GI:  +BS in all four quadrants, soft, non-tender  Extremities:  No edema; 2+ dorsalis pedis pulses bilaterally//calf edema   Neuro: Alert and oriented X 3      Medications:   Current Facility-Administered Medications   Medication Dose Route Frequency   ??? potassium bicarb-citric acid (EFFER-K) tablet 20 mEq  20 mEq Oral BID   ??? calcium gluconate 2 g in 0.9% sodium chloride 100 mL IVPB  2 g IntraVENous ONCE   ??? enoxaparin (LOVENOX) injection 110 mg  110 mg SubCUTAneous Q12H   ??? 0.45% sodium chloride 1,000 mL with potassium chloride 30 mEq infusion   IntraVENous CONTINUOUS   ??? magnesium oxide (MAG-OX) tablet 400 mg  400 mg Oral  DAILY   ??? cholecalciferol (VITAMIN D3) capsule 5,000 Units  5,000 Units Oral DAILY   ??? cyanocobalamin tablet 1,000 mcg  1,000 mcg Oral DAILY   ??? lisinopril (PRINIVIL, ZESTRIL) tablet 40 mg  40 mg Oral DAILY   ??? pantoprazole (PROTONIX) tablet 40 mg  40 mg Oral ACB   ??? thiamine HCL (B-1) tablet 100 mg  100 mg Oral DAILY   ??? therapeutic multivitamin (THERAGRAN) tablet 1 Tab  1 Tab Oral DAILY   ??? acetaminophen (TYLENOL) tablet 650 mg  650 mg Oral Q6H PRN   ??? naloxone (NARCAN) injection 0.4 mg  0.4 mg IntraVENous PRN   ??? ondansetron (ZOFRAN) injection 4 mg  4 mg IntraVENous Q6H PRN   ??? docusate sodium (COLACE) capsule 100 mg  100 mg Oral BID PRN   ??? influenza vaccine 2019-20 (6 mos+)(PF) (FLUARIX/FLULAVAL/FLUZONE QUAD) injection 0.5 mL  0.5 mL IntraMUSCular PRIOR TO DISCHARGE       Labs:    Recent Labs     05/17/18  0516 05/16/18  0524 05/15/18  0223   WBC 6.0 5.3 9.0   HGB 10.1* 9.9* 11.4*   HCT 32.8* 31.4* 37.5   PLT 107* 99* 120*     Recent Labs     05/17/18  1127 05/17/18  0516 05/16/18  1505 05/16/18  0524  05/15/18  0223   NA 148*  144 148* 146*   < > 144   K 2.8* 2.7* 2.3* 2.0*   < > 2.1*   CL 111 109 110 108   < > 103   CO2 31 30 31 30    < > 35*   GLU 100* 91 134* 83   < > 115*   BUN 4* 4* 3* 5*   < > 7   CREA 0.47* 0.45* 0.57* 0.51*   < > 0.72   CA 7.7* 7.3* 7.8* 7.1*   < > 7.0*   MG  --  1.5*  --  1.7  --  1.0*   PHOS  --   --   --  2.9  --   --    ALB  --   --   --   --   --  2.9*   SGOT  --   --   --   --   --  91*   ALT  --   --   --   --   --  68*    < > = values in this interval not displayed.

## 2018-05-17 NOTE — Progress Notes (Signed)
 Progress Notes by Oris Duwaine HERO, RD at 05/17/18 1300                Author: Oris, Duwaine HERO, RD  Service: Nutrition  Author Type: Registered Dietitian       Filed: 05/17/18 1530  Date of Service: 05/17/18 1300  Status: Addendum          Editor: Schorr, Duwaine HERO, RD (Registered Dietitian)          Related Notes: Original Note by Oris Duwaine HERO, RD (Registered Dietitian) filed at 05/17/18  1529               NUTRITION      Nutrition Consult: General Nutrition Management & Supplements         RECOMMENDATIONS / PLAN:        - Change to bariatric maintenance diet.   - Add supplements: Gelatein TID.   - Change MVI to BID.   - Continue to monitor and replace electrolytes as needed.   - Continue RD inpatient monitoring and evaluation.         NUTRITION INTERVENTIONS & DIAGNOSIS:        - Meals/snacks: modify composition   - Medical food supplement therapy: initiate   - IV fluids 1/2 NS + 30 mEq KCL at 100 mL/hr   - Vitamin and mineral supplement therapy: 2 gm Ca, cholecalciferol, cyanocobalamin , mag-ox, 20 mEq K Bicarb, 50 mEq KCL, MVI, thiamine     - Collaboration and referral of nutrition care: interdisciplinary rounds, verbal order from Dr. Veronia for nutrition consult      Nutrition Diagnosis: Impaired nutrient utilization related to altered GI structure  as evidneced by s/p gastric bypass surgery with multiple electrolyte abnormalities.         ASSESSMENT:        Pt s/p gastric bypass in October 2019 with c/o diarrhea and multiple electrolyte abnormalities, being repleted. Pt admits to non-compliance with protein drinks/supplements (states premier protein too  sweet) but does take daily vitamins. Currently on cardiac diet with fair intake, noted multiple sugar packets opened at bedside. Education provided on dumping syndrome. Encouraged pt to read nutritional labels and being aware of sugar content of foods/drinks.  K and Ca replaced.       Nutritional intake adequate to meet patients estimated nutritional  needs:  No      Diet: DIET CARDIAC Regular       Food Allergies: lactose   Current Appetite: Good   Appetite/meal intake prior to admission: inadequate; not compliant with protein supplements; 1-2 meals/day   Feeding Limitations:  []  Swallowing difficulty    []  Chewing difficulty    []   Other:   Current Meal Intake:    Patient Vitals for the past 100 hrs:        % Diet Eaten        05/16/18 1555  85 %        05/16/18 0850  100 %           BM: 12/30   Skin Integrity: WDL   Edema:   [x]  No     []  Yes    Pertinent Medications: Reviewed: ondansetron, pantoprazole        Recent Labs                05/17/18   1127  05/17/18   0516  05/16/18   1505  05/16/18   0524    05/15/18   9776  NA  148*  144  148*  146*    < >  144     K  2.8*  2.7*  2.3*  2.0*    < >  2.1*     CL  111  109  110  108    < >  103     CO2  31  30  31  30     < >  35*     GLU  100*  91  134*  83    < >  115*     BUN  4*  4*  3*  5*    < >  7     CREA  0.47*  0.45*  0.57*  0.51*    < >  0.72     CA  7.7*  7.3*  7.8*  7.1*    < >  7.0*     MG   --   1.5*   --   1.7   --   1.0*     PHOS   --    --    --   2.9   --    --      ALB   --    --    --    --    --   2.9*     SGOT   --    --    --    --    --   91*     ALT   --    --    --    --    --   68*        < > = values in this interval not displayed.           Intake/Output Summary (Last 24 hours) at 05/17/2018 1300   Last data filed at 05/17/2018 0659     Gross per 24 hour        Intake  1198.33 ml        Output  --        Net  1198.33 ml           Anthropometrics:     Ht Readings from Last 1 Encounters:        05/16/18  5' 3 (1.6 m)          Last 3 Recorded Weights in this Encounter            05/16/18 0437  05/16/18 1253  05/17/18 0344          Weight:  100.1 kg (220 lb 11.2 oz)  99.8 kg (220 lb)  106.9 kg (235 lb 11.2 oz)        Body mass index is 41.75 kg/m.         Weight History: -38#, 13.9% x 3 months PTA (intentional s/p gastric bypass October 2019)      Weight Metrics  05/17/2018  03/09/2018   03/02/2018  02/21/2018  02/18/2018  02/08/2018  12/17/2017      Weight  235 lb 11.2 oz  258 lb  269 lb  -  273 lb  273 lb  274 lb      BMI  41.75 kg/m2  45.7 kg/m2  47.65 kg/m2  48.36 kg/m2  -  48.36 kg/m2  48.54 kg/m2                Admitting Diagnosis: Hypokalemia [E87.6]   Syncope [  R55]   Hypomagnesemia [E83.42]   Pertinent PMHx: COPD, DM, hypercholesterolemia, HTN, lap gastric bypass      Education Needs:        [x]  None identified  []  Identified - Not appropriate at this time  []    Identified and addressed - refer to education log   Learning Limitations:   [x]   None identified  []  Identified     Cultural, religious & ethnic food preferences:  [x]   None identified    []  Identified and addressed         ESTIMATED NUTRITION NEEDS:        Calories: 1579-1895 kcal (MSJx1-1.2) based on  [x]  Actual BW: 107     []   IBW    Protein: 86-107 gm (0.8-1 gm/kg) based on  [x]   Actual BW      []  IBW    Fluid: 1 mL/kcal         MONITORING & EVALUATION:        Nutrition Goal(s):    - PO nutrition intake will meet >75% of patient estimated nutritional needs within the next 7 days.    Outcome: New/Initial goal       Monitoring:   [x]   Food and nutrient intake   [x]  Food and nutrient administration   [x]  Comparative standards   [x]  Nutrition-focused physical findings   [x]   Anthropometric Measurements   [x]  Treatment/therapy    [x]  Biochemical data, medical tests, and procedures           Previous Recommendations (for follow-up assessments only):  Not Applicable       Discharge Planning: bariatric maintenance diet + 0 gm sugar nutritional/protein supplement    Participated in care planning, discharge planning, & interdisciplinary rounds as appropriate.         Duwaine CHRISTELLA Flor, RD   Pager: [phone]

## 2018-05-17 NOTE — Progress Notes (Signed)
Problem: Falls - Risk of  Goal: *Absence of Falls  Description  Document Cheryl Paul Fall Risk and appropriate interventions in the flowsheet.  Outcome: Progressing Towards Goal  Note: Fall Risk Interventions:            Medication Interventions: Evaluate medications/consider consulting pharmacy                   Problem: Patient Education: Go to Patient Education Activity  Goal: Patient/Family Education  Outcome: Progressing Towards Goal     Problem: Pressure Injury - Risk of  Goal: *Prevention of pressure injury  Description  Document Braden Scale and appropriate interventions in the flowsheet.  Outcome: Progressing Towards Goal  Note: Pressure Injury Interventions:       Moisture Interventions: Absorbent underpads    Activity Interventions: Pressure redistribution bed/mattress(bed type)    Mobility Interventions: Pressure redistribution bed/mattress (bed type)    Nutrition Interventions: Document food/fluid/supplement intake                     Problem: Patient Education: Go to Patient Education Activity  Goal: Patient/Family Education  Outcome: Progressing Towards Goal     Problem: Pain  Goal: *Control of Pain  Outcome: Progressing Towards Goal     Problem: Patient Education: Go to Patient Education Activity  Goal: Patient/Family Education  Outcome: Progressing Towards Goal

## 2018-05-17 NOTE — Progress Notes (Signed)
 OCCUPATIONAL THERAPY EVALUATION/DISCHARGE    Patient: Cheryl Paul (67 y.o. female)  Date: 05/17/2018  Primary Diagnosis: Hypokalemia [E87.6]  Syncope [R55]  Hypomagnesemia [E83.42]        Precautions:   Fall  PLOF: Pt was independent with basic self care tasks and used a RW/rollator for functional mobility PTA.     ASSESSMENT AND RECOMMENDATIONS:  Based on the objective data described below, the patient is able to perform basic self care tasks and functional transfers without assistance with use of a RW.  She has a supportive daughter at home to assist her prn and all needed DME (shower seat, RW, rollator) for home safety.    Skilled occupational therapy is not indicated at this time.  Discharge Recommendations: None  Further Equipment Recommendations for Discharge: N/A      SUBJECTIVE:   Patient stated "I need to use the bathroom."    OBJECTIVE DATA SUMMARY:     Past Medical History:   Diagnosis Date    Arthritis     knee, arms,     Asthma     Chronic obstructive pulmonary disease (HCC)     Chronic pain     Diabetes (HCC)     H/O sinusitis     Hypercholesterolemia     Hypertension     Sleep apnea     NOT USING     Stress incontinence     Toe fracture, left August 2015    4th toe     Past Surgical History:   Procedure Laterality Date    ABDOMEN SURGERY PROC UNLISTED      4 surguries for blockages    HX CARPAL TUNNEL RELEASE Bilateral     HX CHOLECYSTECTOMY      HX GYN      HX HERNIA REPAIR      abdomen    HX HYSTERECTOMY      tubes tied    HX LAP GASTRIC BYPASS  02/21/2018    HX ORTHOPAEDIC      catrpal tunnel right and left    HX POLYPECTOMY  04/18/14     Barriers to Learning/Limitations: None  Compensate with: visual, verbal, tactile, kinesthetic cues/model    Home Situation:   Home Situation  Home Environment: Private residence  # Steps to Enter: 2  One/Two Story Residence: One story  Living Alone: No  Support Systems: Family member(s)  Patient Expects to be Discharged to:: Private residence  Current DME  Used/Available at Home: Raised toilet seat, Paediatric nurse, Environmental consultant, rollator  Tub or Shower Type: Shower(with seat)  [x]      Right hand dominant   []      Left hand dominant    Cognitive/Behavioral Status:  Neurologic State: Alert  Orientation Level: Oriented X4  Cognition: Appropriate decision making;Follows commands  Safety/Judgement: Awareness of environment;Fall prevention    Skin: Intact on UEs  Edema: None noted in UEs    Vision/Perceptual:    Acuity: Within Defined Limits      Coordination: BUE  Coordination: Within functional limits  Fine Motor Skills-Upper: Left Intact;Right Intact    Gross Motor Skills-Upper: Left Intact;Right Intact    Balance:  Sitting: Intact  Standing: Intact;With support(RW good balance)    Strength: BUE  Strength: Within functional limits    Tone & Sensation: BUE  Tone: Normal  Sensation: Intact    Range of Motion: BUE  AROM: Within functional limits    Functional Mobility and Transfers for ADLs:  Transfers:  Sit to  Stand: Supervision  Stand to Sit: Modified independent   Toilet Transfer : Modified independent    ADL Assessment:  Feeding: Independent    Oral Facial Hygiene/Grooming: Independent    Bathing: Modified independent    Upper Body Dressing: Independent    Lower Body Dressing: Modified independent    Toileting: Modified independent    ADL Intervention:  Patient ambulated to the bathroom without assistance for toileting.  She was able to manage her clothing and perform hygiene with modified independence.  No SOB noted or LOB.    Cognitive Retraining  Safety/Judgement: Awareness of environment;Fall prevention    Pain:  Pain level pre-treatment: 7/10, muscle aching in back of arms and legs   Pain level post-treatment: 7/10, muscle aching in back of arms and legs   Pain Intervention(s): Medication (see MAR); Rest  Response to intervention: Nurse notified, See doc flow    Activity Tolerance:   Good  Please refer to the flowsheet for vital signs taken during this treatment.  After  treatment:   [x]   Patient left in no apparent distress sitting up in chair  []   Patient left in no apparent distress in bed  [x]   Call bell left within reach  [x]   Nursing notified  []   Caregiver present  []   Bed alarm activated    COMMUNICATION/EDUCATION:   [x]       Role of Occupational Therapy in the acute care setting  [x]       Home safety education was provided and the patient/caregiver indicated understanding.  [x]       Patient/family have participated as able and agree with findings and recommendations.  []       Patient is unable to participate in plan of care at this time.    Thank you for this referral.  Ashley DaVanzo, MS OTR/L   Time Calculation: 24 mins      Eval Complexity: History: LOW Complexity : Brief history review ;   Examination: LOW Complexity : 1-3 performance deficits relating to physical, cognitive , or psychosocial skils that result in activity limitations and / or participation restrictions ;   Decision Making:LOW Complexity : No comorbidities that affect functional and no verbal or physical assistance needed to complete eval tasks

## 2018-05-17 NOTE — Progress Notes (Signed)
PHYSICAL THERAPY EVALUATION AND DISCHARGE    Patient: Cheryl Paul (67 y.o. female)  Date: 05/17/2018  Primary Diagnosis: Hypokalemia [E87.6]  Syncope [R55]  Hypomagnesemia [E83.42]        Precautions:   Fall  PLOF: Pt states she is mod(I) with ADL's. Uses RW inside home and rollator in community. She lives with her daughter    ASSESSMENT : Patient received standing next to bed, returning from using restroom. Pt is agreeable to PT evaluation/treatment. She is mod(I) with transfers and ambulates with RW in hallway. Pt would benefit from OP PT to improve LE strength and to further educate on home/gym program for transition to independent exercise program. Patient does not require further skilled intervention in the acute care setting. Educated pt to use call bell for supervision/assist to bathroom as she does not have her RW inside room.         PLAN :  Recommendations and Planned Interventions:   No formal PT needs identified at this time.  Discharge Recommendations: OP PT  Further Equipment Recommendations for Discharge: has all necessary DME      SUBJECTIVE:   Patient stated "I went to the bathroom ."    OBJECTIVE DATA SUMMARY:     Past Medical History:   Diagnosis Date    Arthritis     knee, arms,     Asthma     Chronic obstructive pulmonary disease (HCC)     Chronic pain     Diabetes (HCC)     H/O sinusitis     Hypercholesterolemia     Hypertension     Sleep apnea     NOT USING     Stress incontinence     Toe fracture, left August 2015    4th toe     Past Surgical History:   Procedure Laterality Date    ABDOMEN SURGERY PROC UNLISTED      4 surguries for blockages    HX CARPAL TUNNEL RELEASE Bilateral     HX CHOLECYSTECTOMY      HX GYN      HX HERNIA REPAIR      abdomen    HX HYSTERECTOMY      tubes tied    HX LAP GASTRIC BYPASS  02/21/2018    HX ORTHOPAEDIC      catrpal tunnel right and left    HX POLYPECTOMY  04/18/14     Barriers to Learning/Limitations: None  Compensate with: N/A  Home Situation:   Home  Situation  Home Environment: Private residence  # Steps to Enter: 2  One/Two Story Residence: One story  Living Alone: No  Support Systems: Family member(s)  Patient Expects to be Discharged to:: Private residence  Current DME Used/Available at Home: Raised toilet seat, Paediatric nurse, Environmental consultant, rollator       Strength:    Strength: Generally decreased, functional    Tone & Sensation:   Tone: Normal  Sensation: Intact       Range Of Motion:  AROM: Within functional limits    Functional Mobility:    Transfers:  Sit to Stand: Supervision  Stand to Sit: Modified independent       Balance:   Sitting: Intact  Standing: Intact;With support(RW good balance)       Ambulation/Gait Training:  Distance (ft): 80 Feet (ft)  Assistive Device: Walker, rolling  Ambulation - Level of Assistance: Supervision  Gait Description (WDL): Exceptions to WDL  Gait Abnormalities: Trunk sway increased  Speed/Cadence: Slow  Step Length: Right shortened;Left shortened         Activity Tolerance:   Good  Please refer to the flowsheet for vital signs taken during this treatment.  After treatment:   [x]          Patient left in no apparent distress sitting EOB  []          Patient left in no apparent distress in bed  [x]          Call bell left within reach  [x]          Nursing notified  []          Caregiver present  []          Bed alarm activated  []          SCDs applied    COMMUNICATION/EDUCATION:   [x]          Role of Physical Therapy in the acute care setting.  [x]          Fall prevention education was provided and the patient/caregiver indicated understanding.  []          Patient/family have participated as able in goal setting and plan of care.  []          Patient/family agree to work toward stated goals and plan of care.  []          Patient understands intent and goals of therapy, but is neutral about his/her participation.  []          Patient is unable to participate in goal setting/plan of care: ongoing with therapy staff.  []           Other:    Thank you for this referral.  Pablo Lawrence, PT   Time Calculation: 9 mins      Eval Complexity: History: LOW Complexity : Zero comorbidities / personal factors that will impact the outcome / POCExam:LOW Complexity : 1-2 Standardized tests and measures addressing body structure, function, activity limitation and / or participation in recreation  Presentation: LOW Complexity : Stable, uncomplicated  Clinical Decision Making:Low Complexity    Overall Complexity:LOW

## 2018-05-18 LAB — BASIC METABOLIC PANEL
Anion Gap: 8 mmol/L (ref 3.0–18)
Anion Gap: 7 mmol/L (ref 3.0–18)
Anion Gap: 6 mmol/L (ref 3.0–18)
BUN: 4 MG/DL — ABNORMAL LOW (ref 7.0–18)
BUN: 3 MG/DL — ABNORMAL LOW (ref 7.0–18)
BUN: 2 MG/DL — ABNORMAL LOW (ref 7.0–18)
Bun/Cre Ratio: 7 — ABNORMAL LOW (ref 12–20)
Bun/Cre Ratio: 7 — ABNORMAL LOW (ref 12–20)
Bun/Cre Ratio: 4 — ABNORMAL LOW (ref 12–20)
CO2: 25 mmol/L (ref 21–32)
CO2: 30 mmol/L (ref 21–32)
CO2: 31 mmol/L (ref 21–32)
Calcium: 8 MG/DL — ABNORMAL LOW (ref 8.5–10.1)
Calcium: 7.8 MG/DL — ABNORMAL LOW (ref 8.5–10.1)
Calcium: 8.3 MG/DL — ABNORMAL LOW (ref 8.5–10.1)
Chloride: 110 mmol/L (ref 100–111)
Chloride: 109 mmol/L (ref 100–111)
Chloride: 110 mmol/L (ref 100–111)
Creatinine: 0.59 MG/DL — ABNORMAL LOW (ref 0.6–1.3)
Creatinine: 0.43 MG/DL — ABNORMAL LOW (ref 0.6–1.3)
Creatinine: 0.48 MG/DL — ABNORMAL LOW (ref 0.6–1.3)
EGFR IF NonAfrican American: >60 mL/min/{1.73_m2} (ref >60)
EGFR IF NonAfrican American: >60 mL/min/{1.73_m2} (ref >60)
EGFR IF NonAfrican American: >60 mL/min/{1.73_m2} (ref >60)
GFR African American: >60 mL/min/{1.73_m2} (ref >60)
GFR African American: >60 mL/min/{1.73_m2} (ref >60)
GFR African American: >60 mL/min/{1.73_m2} (ref >60)
Glucose: 86 mg/dL (ref 74–99)
Glucose: 81 mg/dL (ref 74–99)
Glucose: 82 mg/dL (ref 74–99)
Potassium: 4 mmol/L (ref 3.5–5.5)
Potassium: 2.6 mmol/L — CL (ref 3.5–5.5)
Potassium: 3.6 mmol/L (ref 3.5–5.5)
Sodium: 143 mmol/L (ref 136–145)
Sodium: 146 mmol/L — ABNORMAL HIGH (ref 136–145)
Sodium: 147 mmol/L — ABNORMAL HIGH (ref 136–145)

## 2018-05-18 LAB — CBC WITH AUTO DIFFERENTIAL
Basophils %: 0 % (ref 0–2)
Basophils Absolute: 0 10*3/uL (ref 0.0–0.1)
Eosinophils %: 4 % (ref 0–5)
Eosinophils Absolute: 0.2 10*3/uL (ref 0.0–0.4)
Hematocrit: 31.9 % — ABNORMAL LOW (ref 35.0–45.0)
Hemoglobin: 10.2 g/dL — ABNORMAL LOW (ref 12.0–16.0)
Lymphocytes %: 41 % (ref 21–52)
Lymphocytes Absolute: 2.2 10*3/uL (ref 0.9–3.6)
MCH: 27.4 PG (ref 24.0–34.0)
MCHC: 32 g/dL (ref 31.0–37.0)
MCV: 85.8 FL (ref 74.0–97.0)
Monocytes %: 8 % (ref 3–10)
Monocytes Absolute: 0.4 10*3/uL (ref 0.05–1.2)
Neutrophils %: 47 % (ref 40–73)
Neutrophils Absolute: 2.5 10*3/uL (ref 1.8–8.0)
Platelet Comment: DECREASED
Platelets: 103 10*3/uL — ABNORMAL LOW (ref 135–420)
RBC: 3.72 M/uL — ABNORMAL LOW (ref 4.20–5.30)
RDW: 16 % — ABNORMAL HIGH (ref 11.6–14.5)
WBC: 5.3 10*3/uL (ref 4.6–13.2)

## 2018-05-18 LAB — MAGNESIUM
Magnesium: 1.6 mg/dL (ref 1.6–2.6)
Magnesium: 1.6 mg/dL (ref 1.6–2.6)

## 2018-05-18 LAB — CALCIUM, IONIZED
IONIZED CALCIUM: 1.06 MMOL/L — ABNORMAL LOW (ref 1.12–1.32)
Ionized Calcium: 1.06 MMOL/L — ABNORMAL LOW (ref 1.12–1.32)

## 2018-05-18 LAB — CBC WITH AUTOMATED DIFF
ABS. BASOPHILS: 0 10*3/uL (ref 0.0–0.1)
ABS. EOSINOPHILS: 0.2 10*3/uL (ref 0.0–0.4)
ABS. LYMPHOCYTES: 2.2 10*3/uL (ref 0.9–3.6)
ABS. MONOCYTES: 0.4 10*3/uL (ref 0.05–1.2)
ABS. NEUTROPHILS: 2.5 10*3/uL (ref 1.8–8.0)
BASOPHILS: 0 % (ref 0–2)
EOSINOPHILS: 4 % (ref 0–5)
HCT: 31.9 % — ABNORMAL LOW (ref 35.0–45.0)
HGB: 10.2 g/dL — ABNORMAL LOW (ref 12.0–16.0)
LYMPHOCYTES: 41 % (ref 21–52)
MCH: 27.4 PG (ref 24.0–34.0)
MCHC: 32 g/dL (ref 31.0–37.0)
MCV: 85.8 FL (ref 74.0–97.0)
MONOCYTES: 8 % (ref 3–10)
NEUTROPHILS: 47 % (ref 40–73)
PLATELET COMMENTS: DECREASED
PLATELET: 103 10*3/uL — ABNORMAL LOW (ref 135–420)
RBC: 3.72 M/uL — ABNORMAL LOW (ref 4.20–5.30)
RDW: 16 % — ABNORMAL HIGH (ref 11.6–14.5)
WBC: 5.3 10*3/uL (ref 4.6–13.2)

## 2018-05-18 LAB — METABOLIC PANEL, BASIC
Anion gap: 8 mmol/L (ref 3.0–18)
Anion gap: 7 mmol/L (ref 3.0–18)
Anion gap: 6 mmol/L (ref 3.0–18)
BUN/Creatinine ratio: 7 — ABNORMAL LOW (ref 12–20)
BUN/Creatinine ratio: 7 — ABNORMAL LOW (ref 12–20)
BUN/Creatinine ratio: 4 — ABNORMAL LOW (ref 12–20)
BUN: 4 MG/DL — ABNORMAL LOW (ref 7.0–18)
BUN: 3 MG/DL — ABNORMAL LOW (ref 7.0–18)
BUN: 2 MG/DL — ABNORMAL LOW (ref 7.0–18)
CO2: 25 mmol/L (ref 21–32)
CO2: 30 mmol/L (ref 21–32)
CO2: 31 mmol/L (ref 21–32)
Calcium: 8 MG/DL — ABNORMAL LOW (ref 8.5–10.1)
Calcium: 7.8 MG/DL — ABNORMAL LOW (ref 8.5–10.1)
Calcium: 8.3 MG/DL — ABNORMAL LOW (ref 8.5–10.1)
Chloride: 110 mmol/L (ref 100–111)
Chloride: 109 mmol/L (ref 100–111)
Chloride: 110 mmol/L (ref 100–111)
Creatinine: 0.59 MG/DL — ABNORMAL LOW (ref 0.6–1.3)
Creatinine: 0.43 MG/DL — ABNORMAL LOW (ref 0.6–1.3)
Creatinine: 0.48 MG/DL — ABNORMAL LOW (ref 0.6–1.3)
GFR est AA: >60 mL/min/{1.73_m2} (ref >60)
GFR est AA: >60 mL/min/{1.73_m2} (ref >60)
GFR est AA: >60 mL/min/{1.73_m2} (ref >60)
GFR est non-AA: >60 mL/min/{1.73_m2} (ref >60)
GFR est non-AA: >60 mL/min/{1.73_m2} (ref >60)
GFR est non-AA: >60 mL/min/{1.73_m2} (ref >60)
Glucose: 86 mg/dL (ref 74–99)
Glucose: 81 mg/dL (ref 74–99)
Glucose: 82 mg/dL (ref 74–99)
Potassium: 4 mmol/L (ref 3.5–5.5)
Potassium: 2.6 mmol/L — CL (ref 3.5–5.5)
Potassium: 3.6 mmol/L (ref 3.5–5.5)
Sodium: 143 mmol/L (ref 136–145)
Sodium: 146 mmol/L — ABNORMAL HIGH (ref 136–145)
Sodium: 147 mmol/L — ABNORMAL HIGH (ref 136–145)

## 2018-05-18 MED ORDER — POTASSIUM CHLORIDE SR 20 MEQ TAB, PARTICLES/CRYSTALS
20 mEq | ORAL | Status: AC
Start: 2018-05-18 — End: 2018-05-18
  Administered 2018-05-18 (×2): via ORAL

## 2018-05-18 MED ORDER — POTASSIUM BICARBONATE-CITRIC ACID 20 MEQ EFFERVESCENT TABLET
20 mEq | Freq: Once | ORAL | Status: AC
Start: 2018-05-18 — End: 2018-05-19

## 2018-05-18 MED ORDER — POTASSIUM BICARBONATE-CITRIC ACID 20 MEQ EFFERVESCENT TABLET
20 mEq | Freq: Two times a day (BID) | ORAL | Status: DC
Start: 2018-05-18 — End: 2018-05-22
  Administered 2018-05-19 – 2018-05-22 (×7): via ORAL

## 2018-05-18 MED ORDER — SODIUM CHLORIDE 0.9 % IV
100 mg/mL (10%) | Freq: Once | INTRAVENOUS | Status: AC
Start: 2018-05-18 — End: 2018-05-18
  Administered 2018-05-19: via INTRAVENOUS

## 2018-05-18 MED FILL — LISINOPRIL 40 MG TAB: 40 mg | ORAL | Qty: 1

## 2018-05-18 MED FILL — PANTOPRAZOLE 40 MG TAB, DELAYED RELEASE: 40 mg | ORAL | Qty: 1

## 2018-05-18 MED FILL — THERAPEUTIC MULTIVITAMIN TAB: ORAL | Qty: 1

## 2018-05-18 MED FILL — KLOR-CON M20 MEQ TABLET,EXTENDED RELEASE: 20 mEq | ORAL | Qty: 2

## 2018-05-18 MED FILL — MAGNESIUM OXIDE 400 MG TAB: 400 mg | ORAL | Qty: 1

## 2018-05-18 MED FILL — THIAMINE HCL 100 MG TAB: 100 mg | ORAL | Qty: 1

## 2018-05-18 MED FILL — EFFER-K 20 MEQ EFFERVESCENT TABLET: 20 mEq | ORAL | Qty: 1

## 2018-05-18 MED FILL — CYANOCOBALAMIN 1,000 MCG TAB: 1000 mcg | ORAL | Qty: 1

## 2018-05-18 MED FILL — CHOLECALCIFEROL (VITAMIN D3) 5,000 UNIT CAP: ORAL | Qty: 1

## 2018-05-18 MED FILL — MAPAP (ACETAMINOPHEN) 325 MG TABLET: 325 mg | ORAL | Qty: 2

## 2018-05-18 MED FILL — LOVENOX 40 MG/0.4 ML SUBCUTANEOUS SYRINGE: 40 mg/0.4 mL | SUBCUTANEOUS | Qty: 0.4

## 2018-05-18 MED FILL — SODIUM CHLORIDE 0.45 % IV: 0.45 % | INTRAVENOUS | Qty: 1000

## 2018-05-18 NOTE — Progress Notes (Addendum)
[facility] Hospitalist Group  Progress Note    Patient: Cheryl Paul Age: 68 y.o. DOB: 28-Jun-1950 MR#: [id number] SSN: [id number]  Date/Time: 05/18/2018     Subjective:     Sitting in chair   Comfortable  No change in complaints below    C/o of numbness, tingling and pain from feet to thighs   Also c/o upper arm pain, leg cramps and calf tenderness  No dizziness, CP, SOB, cough, n/v/d     Potassium to 2.6 last night   Pt received 80 mEq PO replacement overnight     Assessment/Plan:     1. Syncope- Likely 2/2 vasovagal and multiple electrolytes abnormalities  2. Severe and recurrent hypokalemia  3. Severe hypomagnesemia- improved   4. Hypocalcemia   5. Hypoalbuminemia   6. Diarrhea  7. Short-gut syndrome   8. S/p gastric bypass   9. Elevated troponin- likely 2/2 demand ischemia in setting of syncope and severe electrolyte abnormalities   10. Morbid obesity-status post gastric bypass surgery-currently on nutritional supplements  11. Hx Type II DM - HbA1c 5.3 on 05/16/18; not on any medications  12. Anemia-of chronic illness-rule out deficiency disorder-follow iron profile and B12 level  13. HTN     Continue electrolyte replacement    Pt will likely need at least 60-80 mEq K+ replacement daily at discharge and close f/u with PCP and her surgeon   BLE [place], V/Q scan and Echo reviewed and reassuring   Continue home vitamins, supplements   Daily mag ox   FU BMP q12 hrs   Daily BMP, Mg   Nutrition, PT, OT        Case discussed with:  [x] Patient  []  Family ( In room with patient )    [x] Nursing  [] Case Management  DVT Prophylaxis:  [x] Lovenox  [] Hep SQ  [] SCDs  [] Coumadin   [] On Heparin gtt  Diet: Cardiac   Code Status: FULL   Disposition: Cont care in telemetry; likely home tomorrow       H. Clabe Seal, DO   May 18, 2018           Objective:   VS:   Visit Vitals  BP (!) 118/94 (BP 1 Location: Right arm, BP Patient Position: At rest)   Pulse 83   Temp 98.5 ??F (36.9 ??C)   Resp 18    Ht 5\' 3"  (1.6 m)   Wt 111.1 kg (245 lb)   SpO2 99%   BMI 43.40 kg/m??      Tmax/24hrs: Temp (24hrs), Avg:98.6 ??F (37 ??C), Min:98.4 ??F (36.9 ??C), Max:98.9 ??F (37.2 ??C)  IOBRIEF    Intake/Output Summary (Last 24 hours) at 05/18/2018 1747  Last data filed at 05/18/2018 1317  Gross per 24 hour   Intake 1995 ml   Output 225 ml   Net 1770 ml       General:  Alert, cooperative, no acute distress   Cardiovascular: S1S2 - regular , No Murmur   Pulmonary: Equal expansion , No Use of accessory muscles , No Rales No Rhonchi    GI:  +BS in all four quadrants, soft, non-tender  Extremities:  No edema; 2+ dorsalis pedis pulses bilaterally//calf edema   Neuro: Alert and oriented X 3      Medications:   Current Facility-Administered Medications   Medication Dose Route Frequency   ??? calcium gluconate 1 g in 0.9% sodium chloride 100 mL IVPB  1 g IntraVENous ONCE   ??? [START ON 05/19/2018]  potassium bicarb-citric acid (EFFER-K) tablet 40 mEq  40 mEq Oral BID   ??? enoxaparin (LOVENOX) injection 40 mg  40 mg SubCUTAneous Q24H   ??? therapeutic multivitamin (THERAGRAN) tablet 1 Tab  1 Tab Oral BID   ??? 0.45% sodium chloride 1,000 mL with potassium chloride 30 mEq infusion   IntraVENous CONTINUOUS   ??? magnesium oxide (MAG-OX) tablet 400 mg  400 mg Oral DAILY   ??? cholecalciferol (VITAMIN D3) capsule 5,000 Units  5,000 Units Oral DAILY   ??? cyanocobalamin tablet 1,000 mcg  1,000 mcg Oral DAILY   ??? lisinopril (PRINIVIL, ZESTRIL) tablet 40 mg  40 mg Oral DAILY   ??? pantoprazole (PROTONIX) tablet 40 mg  40 mg Oral ACB   ??? thiamine HCL (B-1) tablet 100 mg  100 mg Oral DAILY   ??? acetaminophen (TYLENOL) tablet 650 mg  650 mg Oral Q6H PRN   ??? naloxone (NARCAN) injection 0.4 mg  0.4 mg IntraVENous PRN   ??? ondansetron (ZOFRAN) injection 4 mg  4 mg IntraVENous Q6H PRN   ??? docusate sodium (COLACE) capsule 100 mg  100 mg Oral BID PRN   ??? influenza vaccine 2019-20 (6 mos+)(PF) (FLUARIX/FLULAVAL/FLUZONE QUAD) injection 0.5 mL  0.5 mL IntraMUSCular PRIOR TO DISCHARGE        Labs:    Recent Labs     05/18/18  0143 05/17/18  0516 05/16/18  0524   WBC 5.3 6.0 5.3   HGB 10.2* 10.1* 9.9*   HCT 31.9* 32.8* 31.4*   PLT 103* 107* 99*     Recent Labs     05/18/18  1122 05/18/18  0143 05/17/18  1945  05/17/18  0516  05/16/18  0524   NA 147* 146* 143   < > 144   < > 146*   K 3.6 2.6* 4.0   < > 2.7*   < > 2.0*   CL 110 109 110   < > 109   < > 108   CO2 31 30 25    < > 30   < > 30   GLU 82 81 86   < > 91   < > 83   BUN 2* 3* 4*   < > 4*   < > 5*   CREA 0.48* 0.43* 0.59*   < > 0.45*   < > 0.51*   CA 8.3* 7.8* 8.0*   < > 7.3*   < > 7.1*   MG  --  1.6  --   --  1.5*  --  1.7   PHOS  --   --   --   --   --   --  2.9    < > = values in this interval not displayed.

## 2018-05-18 NOTE — Other (Addendum)
Bedside shift change report given to Toniann Fail, RN (oncoming nurse) by Blair Hailey   RN (offgoing nurse). Report included the following information SBAR, Kardex, Intake/Output and Recent Results.

## 2018-05-18 NOTE — Progress Notes (Signed)
[facility] Hospitalist Group  Progress Note    Patient: Cheryl Paul Age: 68 y.o. DOB: December 20, 1950 MR#: [id number] SSN: [id number]  Date/Time: 05/18/2018     Subjective:     Sitting in chair   Comfortable  No change in complaints below    C/o of numbness, tingling and pain from feet to thighs   Also c/o upper arm pain, leg cramps and calf tenderness  No dizziness, CP, SOB, cough, n/v/d     Potassium to 2.6 last night   Pt received 80 mEq PO replacement overnight     Assessment/Plan:     1. Syncope- Likely 2/2 vasovagal and multiple electrolytes abnormalities  2. Severe and recurrent hypokalemia  3. Severe hypomagnesemia- improved   4. Hypocalcemia   5. Hypoalbuminemia   6. Diarrhea  7. Short-gut syndrome   8. S/p gastric bypass   9. Elevated troponin- likely 2/2 demand ischemia in setting of syncope and severe electrolyte abnormalities   10. Morbid obesity-status post gastric bypass surgery-currently on nutritional supplements  11. Hx Type II DM - HbA1c 5.3 on 05/16/18; not on any medications  12. Anemia-of chronic illness-rule out deficiency disorder-follow iron profile and B12 level  13. HTN     Continue electrolyte replacement    Pt will likely need at least 60-80 mEq K+ replacement daily at discharge and close f/u with PCP and her surgeon   BLE [place], V/Q scan and Echo reviewed and reassuring   Continue home vitamins, supplements   Daily mag ox   FU BMP q12 hrs   Daily BMP, Mg   Nutrition, PT, OT        Case discussed with:  [x] Patient  []  Family ( In room with patient )    [x] Nursing  [] Case Management  DVT Prophylaxis:  [x] Lovenox  [] Hep SQ  [] SCDs  [] Coumadin   [] On Heparin gtt  Diet: Cardiac   Code Status: FULL   Disposition: Cont care in telemetry; likely home tomorrow       H. Clabe Seal, DO   May 18, 2018           Objective:   VS:   Visit Vitals  BP (!) 118/94 (BP 1 Location: Right arm, BP Patient Position: At rest)   Pulse 83   Temp 98.5 ??F (36.9 ??C)   Resp 18   Ht 5\' 3"  (1.6 m)    Wt 111.1 kg (245 lb)   SpO2 99%   BMI 43.40 kg/m??      Tmax/24hrs: Temp (24hrs), Avg:98.6 ??F (37 ??C), Min:98.4 ??F (36.9 ??C), Max:98.9 ??F (37.2 ??C)  IOBRIEF    Intake/Output Summary (Last 24 hours) at 05/18/2018 1747  Last data filed at 05/18/2018 1317  Gross per 24 hour   Intake 1995 ml   Output 225 ml   Net 1770 ml       General:  Alert, cooperative, no acute distress   Cardiovascular: S1S2 - regular , No Murmur   Pulmonary: Equal expansion , No Use of accessory muscles , No Rales No Rhonchi    GI:  +BS in all four quadrants, soft, non-tender  Extremities:  No edema; 2+ dorsalis pedis pulses bilaterally//calf edema   Neuro: Alert and oriented X 3      Medications:   Current Facility-Administered Medications   Medication Dose Route Frequency   ??? calcium gluconate 1 g in 0.9% sodium chloride 100 mL IVPB  1 g IntraVENous ONCE   ??? [START ON 05/19/2018]  potassium bicarb-citric acid (EFFER-K) tablet 40 mEq  40 mEq Oral BID   ??? enoxaparin (LOVENOX) injection 40 mg  40 mg SubCUTAneous Q24H   ??? therapeutic multivitamin (THERAGRAN) tablet 1 Tab  1 Tab Oral BID   ??? 0.45% sodium chloride 1,000 mL with potassium chloride 30 mEq infusion   IntraVENous CONTINUOUS   ??? magnesium oxide (MAG-OX) tablet 400 mg  400 mg Oral DAILY   ??? cholecalciferol (VITAMIN D3) capsule 5,000 Units  5,000 Units Oral DAILY   ??? cyanocobalamin tablet 1,000 mcg  1,000 mcg Oral DAILY   ??? lisinopril (PRINIVIL, ZESTRIL) tablet 40 mg  40 mg Oral DAILY   ??? pantoprazole (PROTONIX) tablet 40 mg  40 mg Oral ACB   ??? thiamine HCL (B-1) tablet 100 mg  100 mg Oral DAILY   ??? acetaminophen (TYLENOL) tablet 650 mg  650 mg Oral Q6H PRN   ??? naloxone (NARCAN) injection 0.4 mg  0.4 mg IntraVENous PRN   ??? ondansetron (ZOFRAN) injection 4 mg  4 mg IntraVENous Q6H PRN   ??? docusate sodium (COLACE) capsule 100 mg  100 mg Oral BID PRN   ??? influenza vaccine 2019-20 (6 mos+)(PF) (FLUARIX/FLULAVAL/FLUZONE QUAD) injection 0.5 mL  0.5 mL IntraMUSCular PRIOR TO DISCHARGE       Labs:     Recent Labs     05/18/18  0143 05/17/18  0516 05/16/18  0524   WBC 5.3 6.0 5.3   HGB 10.2* 10.1* 9.9*   HCT 31.9* 32.8* 31.4*   PLT 103* 107* 99*     Recent Labs     05/18/18  1122 05/18/18  0143 05/17/18  1945  05/17/18  0516  05/16/18  0524   NA 147* 146* 143   < > 144   < > 146*   K 3.6 2.6* 4.0   < > 2.7*   < > 2.0*   CL 110 109 110   < > 109   < > 108   CO2 31 30 25    < > 30   < > 30   GLU 82 81 86   < > 91   < > 83   BUN 2* 3* 4*   < > 4*   < > 5*   CREA 0.48* 0.43* 0.59*   < > 0.45*   < > 0.51*   CA 8.3* 7.8* 8.0*   < > 7.3*   < > 7.1*   MG  --  1.6  --   --  1.5*  --  1.7   PHOS  --   --   --   --   --   --  2.9    < > = values in this interval not displayed.

## 2018-05-19 LAB — CBC WITH AUTO DIFFERENTIAL
Basophils %: 0 % (ref 0–2)
Basophils Absolute: 0 10*3/uL (ref 0.0–0.1)
Eosinophils %: 3 % (ref 0–5)
Eosinophils Absolute: 0.2 10*3/uL (ref 0.0–0.4)
Hematocrit: 34.7 % — ABNORMAL LOW (ref 35.0–45.0)
Hemoglobin: 10.9 g/dL — ABNORMAL LOW (ref 12.0–16.0)
Lymphocytes %: 37 % (ref 21–52)
Lymphocytes Absolute: 1.7 10*3/uL (ref 0.9–3.6)
MCH: 27.2 PG (ref 24.0–34.0)
MCHC: 31.4 g/dL (ref 31.0–37.0)
MCV: 86.5 FL (ref 74.0–97.0)
Monocytes %: 10 % (ref 3–10)
Monocytes Absolute: 0.5 10*3/uL (ref 0.05–1.2)
Neutrophils %: 50 % (ref 40–73)
Neutrophils Absolute: 2.3 10*3/uL (ref 1.8–8.0)
Platelets: 103 10*3/uL — ABNORMAL LOW (ref 135–420)
RBC: 4.01 M/uL — ABNORMAL LOW (ref 4.20–5.30)
RDW: 16.1 % — ABNORMAL HIGH (ref 11.6–14.5)
WBC: 4.7 10*3/uL (ref 4.6–13.2)

## 2018-05-19 LAB — BASIC METABOLIC PANEL
Anion Gap: 9 mmol/L (ref 3.0–18)
Anion Gap: 5 mmol/L (ref 3.0–18)
BUN: 3 MG/DL — ABNORMAL LOW (ref 7.0–18)
BUN: 3 MG/DL — ABNORMAL LOW (ref 7.0–18)
Bun/Cre Ratio: 6 — ABNORMAL LOW (ref 12–20)
Bun/Cre Ratio: 5 — ABNORMAL LOW (ref 12–20)
CO2: 29 mmol/L (ref 21–32)
CO2: 32 mmol/L (ref 21–32)
Calcium: 8.1 MG/DL — ABNORMAL LOW (ref 8.5–10.1)
Calcium: 8.4 MG/DL — ABNORMAL LOW (ref 8.5–10.1)
Chloride: 107 mmol/L (ref 100–111)
Chloride: 104 mmol/L (ref 100–111)
Creatinine: 0.49 MG/DL — ABNORMAL LOW (ref 0.6–1.3)
Creatinine: 0.57 MG/DL — ABNORMAL LOW (ref 0.6–1.3)
EGFR IF NonAfrican American: >60 mL/min/{1.73_m2} (ref >60)
EGFR IF NonAfrican American: >60 mL/min/{1.73_m2} (ref >60)
GFR African American: >60 mL/min/{1.73_m2} (ref >60)
GFR African American: >60 mL/min/{1.73_m2} (ref >60)
Glucose: 80 mg/dL (ref 74–99)
Glucose: 109 mg/dL — ABNORMAL HIGH (ref 74–99)
Potassium: 3 mmol/L — ABNORMAL LOW (ref 3.5–5.5)
Potassium: 3.1 mmol/L — ABNORMAL LOW (ref 3.5–5.5)
Sodium: 145 mmol/L (ref 136–145)
Sodium: 141 mmol/L (ref 136–145)

## 2018-05-19 LAB — MAGNESIUM
Magnesium: 1.5 mg/dL — ABNORMAL LOW (ref 1.6–2.6)
Magnesium: 1.5 mg/dL — ABNORMAL LOW (ref 1.6–2.6)

## 2018-05-19 LAB — METABOLIC PANEL, BASIC
Anion gap: 9 mmol/L (ref 3.0–18)
Anion gap: 5 mmol/L (ref 3.0–18)
BUN/Creatinine ratio: 6 — ABNORMAL LOW (ref 12–20)
BUN/Creatinine ratio: 5 — ABNORMAL LOW (ref 12–20)
BUN: 3 MG/DL — ABNORMAL LOW (ref 7.0–18)
BUN: 3 MG/DL — ABNORMAL LOW (ref 7.0–18)
CO2: 29 mmol/L (ref 21–32)
CO2: 32 mmol/L (ref 21–32)
Calcium: 8.1 MG/DL — ABNORMAL LOW (ref 8.5–10.1)
Calcium: 8.4 MG/DL — ABNORMAL LOW (ref 8.5–10.1)
Chloride: 107 mmol/L (ref 100–111)
Chloride: 104 mmol/L (ref 100–111)
Creatinine: 0.49 MG/DL — ABNORMAL LOW (ref 0.6–1.3)
Creatinine: 0.57 MG/DL — ABNORMAL LOW (ref 0.6–1.3)
GFR est AA: >60 mL/min/{1.73_m2} (ref >60)
GFR est AA: >60 mL/min/{1.73_m2} (ref >60)
GFR est non-AA: >60 mL/min/{1.73_m2} (ref >60)
GFR est non-AA: >60 mL/min/{1.73_m2} (ref >60)
Glucose: 80 mg/dL (ref 74–99)
Glucose: 109 mg/dL — ABNORMAL HIGH (ref 74–99)
Potassium: 3 mmol/L — ABNORMAL LOW (ref 3.5–5.5)
Potassium: 3.1 mmol/L — ABNORMAL LOW (ref 3.5–5.5)
Sodium: 145 mmol/L (ref 136–145)
Sodium: 141 mmol/L (ref 136–145)

## 2018-05-19 LAB — CBC WITH AUTOMATED DIFF
ABS. BASOPHILS: 0 10*3/uL (ref 0.0–0.1)
ABS. EOSINOPHILS: 0.2 10*3/uL (ref 0.0–0.4)
ABS. LYMPHOCYTES: 1.7 10*3/uL (ref 0.9–3.6)
ABS. MONOCYTES: 0.5 10*3/uL (ref 0.05–1.2)
ABS. NEUTROPHILS: 2.3 10*3/uL (ref 1.8–8.0)
BASOPHILS: 0 % (ref 0–2)
EOSINOPHILS: 3 % (ref 0–5)
HCT: 34.7 % — ABNORMAL LOW (ref 35.0–45.0)
HGB: 10.9 g/dL — ABNORMAL LOW (ref 12.0–16.0)
LYMPHOCYTES: 37 % (ref 21–52)
MCH: 27.2 PG (ref 24.0–34.0)
MCHC: 31.4 g/dL (ref 31.0–37.0)
MCV: 86.5 FL (ref 74.0–97.0)
MONOCYTES: 10 % (ref 3–10)
NEUTROPHILS: 50 % (ref 40–73)
PLATELET: 103 10*3/uL — ABNORMAL LOW (ref 135–420)
RBC: 4.01 M/uL — ABNORMAL LOW (ref 4.20–5.30)
RDW: 16.1 % — ABNORMAL HIGH (ref 11.6–14.5)
WBC: 4.7 10*3/uL (ref 4.6–13.2)

## 2018-05-19 MED ORDER — MAGNESIUM SULFATE 2 GRAM/50 ML IVPB
2 gram/50 mL (4 %) | Freq: Once | INTRAVENOUS | Status: AC
Start: 2018-05-19 — End: 2018-05-19
  Administered 2018-05-19: 23:00:00 via INTRAVENOUS

## 2018-05-19 MED ORDER — CALCIUM CITRATE 200 MG (950 MG) TAB
200 mg (950 mg) | Freq: Every day | ORAL | Status: DC
Start: 2018-05-19 — End: 2018-05-22
  Administered 2018-05-20 – 2018-05-22 (×3): via ORAL

## 2018-05-19 MED FILL — THIAMINE HCL 100 MG TAB: 100 mg | ORAL | Qty: 1

## 2018-05-19 MED FILL — EFFER-K 20 MEQ EFFERVESCENT TABLET: 20 mEq | ORAL | Qty: 2

## 2018-05-19 MED FILL — SODIUM CHLORIDE 0.45 % IV: 0.45 % | INTRAVENOUS | Qty: 1000

## 2018-05-19 MED FILL — MAGNESIUM SULFATE 2 GRAM/50 ML IVPB: 2 gram/50 mL (4 %) | INTRAVENOUS | Qty: 50

## 2018-05-19 MED FILL — MAGNESIUM OXIDE 400 MG TAB: 400 mg | ORAL | Qty: 1

## 2018-05-19 MED FILL — CYANOCOBALAMIN 1,000 MCG TAB: 1000 mcg | ORAL | Qty: 1

## 2018-05-19 MED FILL — CALCITRATE 200 MG (950 MG) TABLET: 200 mg (950 mg) | ORAL | Qty: 1

## 2018-05-19 MED FILL — LOVENOX 40 MG/0.4 ML SUBCUTANEOUS SYRINGE: 40 mg/0.4 mL | SUBCUTANEOUS | Qty: 0.4

## 2018-05-19 MED FILL — LISINOPRIL 40 MG TAB: 40 mg | ORAL | Qty: 1

## 2018-05-19 MED FILL — PANTOPRAZOLE 40 MG TAB, DELAYED RELEASE: 40 mg | ORAL | Qty: 1

## 2018-05-19 MED FILL — THERAPEUTIC MULTIVITAMIN TAB: ORAL | Qty: 1

## 2018-05-19 MED FILL — CALCIUM GLUCONATE 100 MG/ML (10%) IV SOLN: 100 mg/mL (10%) | INTRAVENOUS | Qty: 10

## 2018-05-19 MED FILL — CHOLECALCIFEROL (VITAMIN D3) 5,000 UNIT CAP: ORAL | Qty: 1

## 2018-05-19 NOTE — Progress Notes (Addendum)
NUTRITION    Nutrition Consult: General Nutrition Management & Supplements     RECOMMENDATIONS / PLAN:     - Change supplements to [employer] BID.  - Add calcium citrate supplementation daily.  - Recommend IV magnesium replacement.  - IV fluids per MD.  - Continue RD inpatient monitoring and evaluation.     NUTRITION INTERVENTIONS & DIAGNOSIS:     - Meals/snacks: modify composition  - Medical food supplement therapy: Gelatein TID- modify  - IV fluids 1/2 NS + 30 mEq KCL at 50 mL/hr (36 mEq KCL)  - Vitamin and mineral supplement therapy: cholecalciferol, cyanocobalamin, mag-ox, 80 mEq K Bicarb, MVI BID, thiamine; add calcium citrate  - Collaboration and referral of nutrition care: interdisciplinary rounds, discussed nutritional recommendations with Dr. Lindie Spruce    Nutrition Diagnosis: Impaired nutrient utilization related to altered GI structure as evidneced by s/p gastric bypass surgery with multiple electrolyte abnormalities.    Inadequate protein intake related to dislike of nutritional supplements as evidenced by pt with poor intake of protein, not consuming supplements    ASSESSMENT:     1/2: Pt c/o portion sizes and dislike of meals provided stating they are not cooked to her liking. Dislikes Gelatein, not consuming. Hypokalemia persists requiring continued replacement. Only reporting one loose stool today. Encouraged follow up with outpatient DM, offered assistance with making an appointment however pt states she will call herself.     12/31: Pt s/p gastric bypass in October 2019 with c/o diarrhea and multiple electrolyte abnormalities, being repleted. Pt admits to non-compliance with protein drinks/supplements (states premier protein too sweet) but does take daily vitamins. Currently on cardiac diet with fair intake, noted multiple sugar packets opened at bedside. Education provided on dumping syndrome. Encouraged pt to read nutritional labels and being aware of sugar content of foods/drinks. K and Ca replaced.      Nutritional intake adequate to meet patients estimated nutritional needs:  No    Diet: DIET BARIATRIC MAINTENANCE  DIET NUTRITIONAL SUPPLEMENTS Breakfast, Lunch, Dinner; GELATEIN 20      Food Allergies: lactose  Current Appetite: Good  Appetite/meal intake prior to admission: inadequate; not compliant with protein supplements; 1-2 meals/day  Feeding Limitations:  []  Swallowing difficulty    []  Chewing difficulty    []  Other:  Current Meal Intake:   Patient Vitals for the past 100 hrs:   % Diet Eaten   05/19/18 0916 100 %   05/18/18 1828 100 %   05/18/18 1317 100 %   05/18/18 1018 100 %   05/17/18 1736 100 %   05/17/18 1330 100 %   05/17/18 0830 100 %   05/16/18 1555 85 %   05/16/18 0850 100 %       BM: 12/30  Skin Integrity: WDL  Edema:   [x]  No     []  Yes   Pertinent Medications: Reviewed: ondansetron, pantoprazole    Recent Labs     05/19/18  1246 05/19/18  0536 05/18/18  1122 05/18/18  0143  05/17/18  0516   NA 141 145 147* 146*   < > 144   K 3.1* 3.0* 3.6 2.6*   < > 2.7*   CL 104 107 110 109   < > 109   CO2 32 29 31 30    < > 30   GLU 109* 80 82 81   < > 91   BUN 3* 3* 2* 3*   < > 4*   CREA 0.57* 0.49* 0.48* 0.43*   < >  0.45*   CA 8.4* 8.1* 8.3* 7.8*   < > 7.3*   MG  --  1.5*  --  1.6  --  1.5*    < > = values in this interval not displayed.       Intake/Output Summary (Last 24 hours) at 05/19/2018 1455  Last data filed at 05/19/2018 2774  Gross per 24 hour   Intake 400 ml   Output 400 ml   Net 0 ml       Anthropometrics:  Ht Readings from Last 1 Encounters:   05/16/18 5\' 3"  (1.6 m)     Last 3 Recorded Weights in this Encounter    05/17/18 0344 05/18/18 0436 05/19/18 0503   Weight: 106.9 kg (235 lb 11.2 oz) 111.1 kg (245 lb) 111.8 kg (246 lb 6.4 oz)     Body mass index is 43.65 kg/m??.       Weight History: -38#, 13.9% x 3 months PTA (intentional s/p gastric bypass October 2019)    Weight Metrics 05/19/2018 03/09/2018 03/02/2018 02/21/2018 02/18/2018 02/08/2018 12/17/2017    Weight 246 lb 6.4 oz 258 lb 269 lb - 273 lb 273 lb 274 lb   BMI 43.65 kg/m2 45.7 kg/m2 47.65 kg/m2 48.36 kg/m2 - 48.36 kg/m2 48.54 kg/m2        Admitting Diagnosis: Hypokalemia [E87.6]  Syncope [R55]  Hypomagnesemia [E83.42]  Pertinent PMHx: COPD, DM, hypercholesterolemia, HTN, lap gastric bypass    Education Needs:        [x]  None identified  []  Identified - Not appropriate at this time  []   Identified and addressed - refer to education log  Learning Limitations:   [x]  None identified  []  Identified    Cultural, religious & ethnic food preferences:  [x]  None identified    []  Identified and addressed     ESTIMATED NUTRITION NEEDS:     Calories: 1579-1895 kcal (MSJx1-1.2) based on  [x]  Actual BW: 107     []  IBW   Protein: 86-107 gm (0.8-1 gm/kg) based on  [x]  Actual BW      []  IBW   Fluid: 1 mL/kcal     MONITORING & EVALUATION:     Nutrition Goal(s):   - PO nutrition intake will meet >75% of patient estimated nutritional needs within the next 7 days.   Outcome: Progressing towards goal     Monitoring:   [x]  Food and nutrient intake   [x]  Food and nutrient administration  [x]  Comparative standards   [x]  Nutrition-focused physical findings   [x]  Anthropometric Measurements   [x]  Treatment/therapy   [x]  Biochemical data, medical tests, and procedures        Previous Recommendations (for follow-up assessments only):  Implemented      Discharge Planning: bariatric maintenance diet + 0 gm sugar nutritional/protein supplement   Participated in care planning, discharge planning, & interdisciplinary rounds as appropriate.      De Hollingshead, RD  Pager: [phone]

## 2018-05-19 NOTE — Progress Notes (Signed)
Met with patient at bedside.  Plan remains home when medically stable.    Alverda Skeans, RN BSN  Care Manager  [phone]

## 2018-05-19 NOTE — Other (Signed)
Bedside shift change report given to Clydie Braun, Charity fundraiser (oncoming nurse) by Sherrilyn Rist, RN (offgoing nurse). Report included the following information SBAR, Kardex, MAR and Recent Results.

## 2018-05-19 NOTE — Telephone Encounter (Signed)
Pt S/P LGBP 02/15/2018  called c/o of "bottoming out" during bingo session. Pt says she is currently in [facility] being treated for dehydration. Pt says she is drinking a half a 16oz bottle of water  and the rest of her fluid intake was green tea. She says she is not sure of total amount of fluid she is drinking daily.  Patient also reports that she was told that she can drink apple cider. She says that she does not like that taste of water. I explained to her that she should be getting in 64oz plus of water daily at this point. Pt has a f/u scheduled with Dr. Karleen Hampshire on the 17th. Karleen Hampshire also advised that the patient follow up with [facility] for nutritional counseling.

## 2018-05-19 NOTE — Progress Notes (Signed)
[facility] Hospitalist Group  Progress Note    Patient: Cheryl Paul Age: 68 y.o. DOB: [birth year] MR#: [id number] SSN: [id number]  Date: 05/19/2018     Subjective/24-hour events:     No new issues overnight.  Major complaint is that of whether or not she will be able to have any additional food other than current bariatric diet that is ordered.    Assessment:   Syncope suspect vasovagal  Multiple electrolyte abnormalities: Hypokalemia, hypomagnesemia, hypocalcemia  Low protein stores/hypoalbuminemia  Anemia of chronic disease  Hypertension  Recent gastric bypass surgery 02/2018  Troponin elevation likely secondary to demand ischemia  Morbid obesity     Plan:  Electrolyte replacement as necessary.  Potassium lower than yesterday and will need additional supplementation.  Monitor.  Bariatric diet to continue as ordered.  Discussed with clinical nutrition and has recommended that this be maintained.  Nutritional supplements as needed.  PT/OT.  Mobilize as tolerated.  Supportive care otherwise.  Follow.    Case discussed with:  [x] Patient  [] Family  [x] Nursing  [x] Case Management  DVT Prophylaxis:  [x] Lovenox  [] Hep SQ  [] SCDs  [] Coumadin   [] On Heparin gtt    Objective:   VS:   Visit Vitals  BP 135/59 (BP 1 Location: Left arm, BP Patient Position: At rest)   Pulse 76   Temp 98.6 ??F (37 ??C)   Resp 18   Ht 5\' 3"  (1.6 m)   Wt 111.8 kg (246 lb 6.4 oz)   SpO2 100%   BMI 43.65 kg/m??      Tmax/24hrs: Temp (24hrs), Avg:98.6 ??F (37 ??C), Min:98.3 ??F (36.8 ??C), Max:99 ??F (37.2 ??C)      Intake/Output Summary (Last 24 hours) at 05/19/2018 1308  Last data filed at 05/19/2018 3009  Gross per 24 hour   Intake 720 ml   Output 500 ml   Net 220 ml       General: In NAD.  Cardiovascular: RRR.  Pulmonary: Lungs clear, no wheezes.  Effort nonlabored.  GI: Abdomen soft.  Extremities: Warm, no edema.  Neuro: Awake and alert, motor nonfocal.    Labs:    Recent Results (from the past 24 hour(s))   CBC WITH AUTOMATED DIFF     Collection Time: 05/19/18  5:36 AM   Result Value Ref Range    WBC 4.7 4.6 - 13.2 K/uL    RBC 4.01 (L) 4.20 - 5.30 M/uL    HGB 10.9 (L) 12.0 - 16.0 g/dL    HCT 23.3 (L) 00.7 - 45.0 %    MCV 86.5 74.0 - 97.0 FL    MCH 27.2 24.0 - 34.0 PG    MCHC 31.4 31.0 - 37.0 g/dL    RDW 62.2 (H) 63.3 - 14.5 %    PLATELET 103 (L) 135 - 420 K/uL    NEUTROPHILS 50 40 - 73 %    LYMPHOCYTES 37 21 - 52 %    MONOCYTES 10 3 - 10 %    EOSINOPHILS 3 0 - 5 %    BASOPHILS 0 0 - 2 %    ABS. NEUTROPHILS 2.3 1.8 - 8.0 K/UL    ABS. LYMPHOCYTES 1.7 0.9 - 3.6 K/UL    ABS. MONOCYTES 0.5 0.05 - 1.2 K/UL    ABS. EOSINOPHILS 0.2 0.0 - 0.4 K/UL    ABS. BASOPHILS 0.0 0.0 - 0.1 K/UL    DF AUTOMATED     MAGNESIUM    Collection Time: 05/19/18  5:36 AM  Result Value Ref Range    Magnesium 1.5 (L) 1.6 - 2.6 mg/dL   METABOLIC PANEL, BASIC    Collection Time: 05/19/18  5:36 AM   Result Value Ref Range    Sodium 145 136 - 145 mmol/L    Potassium 3.0 (L) 3.5 - 5.5 mmol/L    Chloride 107 100 - 111 mmol/L    CO2 29 21 - 32 mmol/L    Anion gap 9 3.0 - 18 mmol/L    Glucose 80 74 - 99 mg/dL    BUN 3 (L) 7.0 - 18 MG/DL    Creatinine 0.49 (L) 0.6 - 1.3 MG/DL    BUN/Creatinine ratio 6 (L) 12 - 20      GFR est AA >60 >60 ml/min/1.73m2    GFR est non-AA >60 >60 ml/min/1.73m2    Calcium 8.1 (L) 8.5 - 10.1 MG/DL       Signed By: Cohan Stipes J Shuntay Everetts, MD     May 19, 2018

## 2018-05-19 NOTE — Other (Signed)
Bedside shift change report given to Cathlean Marseilles (oncoming nurse) by Blair Hailey   RN (offgoing nurse). Report included the following information SBAR, Kardex, Intake/Output and Recent Results.

## 2018-05-19 NOTE — Progress Notes (Signed)
[facility] Hospitalist Group  Progress Note    Patient: Cheryl Paul Age: 68 y.o. DOB: [birth year] MR#: [id number] SSN: [id number]  Date: 05/19/2018     Subjective/24-hour events:     No new issues overnight.  Major complaint is that of whether or not she will be able to have any additional food other than current bariatric diet that is ordered.    Assessment:   Syncope suspect vasovagal  Multiple electrolyte abnormalities: Hypokalemia, hypomagnesemia, hypocalcemia  Low protein stores/hypoalbuminemia  Anemia of chronic disease  Hypertension  Recent gastric bypass surgery 02/2018  Troponin elevation likely secondary to demand ischemia  Morbid obesity     Plan:  Electrolyte replacement as necessary.  Potassium lower than yesterday and will need additional supplementation.  Monitor.  Bariatric diet to continue as ordered.  Discussed with clinical nutrition and has recommended that this be maintained.  Nutritional supplements as needed.  PT/OT.  Mobilize as tolerated.  Supportive care otherwise.  Follow.    Case discussed with:  [x] Patient  [] Family  [x] Nursing  [x] Case Management  DVT Prophylaxis:  [x] Lovenox  [] Hep SQ  [] SCDs  [] Coumadin   [] On Heparin gtt    Objective:   VS:   Visit Vitals  BP 135/59 (BP 1 Location: Left arm, BP Patient Position: At rest)   Pulse 76   Temp 98.6 ??F (37 ??C)   Resp 18   Ht 5\' 3"  (1.6 m)   Wt 111.8 kg (246 lb 6.4 oz)   SpO2 100%   BMI 43.65 kg/m??      Tmax/24hrs: Temp (24hrs), Avg:98.6 ??F (37 ??C), Min:98.3 ??F (36.8 ??C), Max:99 ??F (37.2 ??C)      Intake/Output Summary (Last 24 hours) at 05/19/2018 1308  Last data filed at 05/19/2018 3009  Gross per 24 hour   Intake 720 ml   Output 500 ml   Net 220 ml       General: In NAD.  Cardiovascular: RRR.  Pulmonary: Lungs clear, no wheezes.  Effort nonlabored.  GI: Abdomen soft.  Extremities: Warm, no edema.  Neuro: Awake and alert, motor nonfocal.    Labs:    Recent Results (from the past 24 hour(s))   CBC WITH AUTOMATED DIFF     Collection Time: 05/19/18  5:36 AM   Result Value Ref Range    WBC 4.7 4.6 - 13.2 K/uL    RBC 4.01 (L) 4.20 - 5.30 M/uL    HGB 10.9 (L) 12.0 - 16.0 g/dL    HCT 23.3 (L) 00.7 - 45.0 %    MCV 86.5 74.0 - 97.0 FL    MCH 27.2 24.0 - 34.0 PG    MCHC 31.4 31.0 - 37.0 g/dL    RDW 62.2 (H) 63.3 - 14.5 %    PLATELET 103 (L) 135 - 420 K/uL    NEUTROPHILS 50 40 - 73 %    LYMPHOCYTES 37 21 - 52 %    MONOCYTES 10 3 - 10 %    EOSINOPHILS 3 0 - 5 %    BASOPHILS 0 0 - 2 %    ABS. NEUTROPHILS 2.3 1.8 - 8.0 K/UL    ABS. LYMPHOCYTES 1.7 0.9 - 3.6 K/UL    ABS. MONOCYTES 0.5 0.05 - 1.2 K/UL    ABS. EOSINOPHILS 0.2 0.0 - 0.4 K/UL    ABS. BASOPHILS 0.0 0.0 - 0.1 K/UL    DF AUTOMATED     MAGNESIUM    Collection Time: 05/19/18  5:36 AM  Result Value Ref Range    Magnesium 1.5 (L) 1.6 - 2.6 mg/dL   METABOLIC PANEL, BASIC    Collection Time: 05/19/18  5:36 AM   Result Value Ref Range    Sodium 145 136 - 145 mmol/L    Potassium 3.0 (L) 3.5 - 5.5 mmol/L    Chloride 107 100 - 111 mmol/L    CO2 29 21 - 32 mmol/L    Anion gap 9 3.0 - 18 mmol/L    Glucose 80 74 - 99 mg/dL    BUN 3 (L) 7.0 - 18 MG/DL    Creatinine 6.25 (L) 0.6 - 1.3 MG/DL    BUN/Creatinine ratio 6 (L) 12 - 20      GFR est AA >60 >60 ml/min/1.54m2    GFR est non-AA >60 >60 ml/min/1.56m2    Calcium 8.1 (L) 8.5 - 10.1 MG/DL       Signed By: Areatha Keas, MD     May 19, 2018

## 2018-05-19 NOTE — Progress Notes (Signed)
Met with patient at bedside.  Plan remains home when medically stable.    Alverda Skeans, RN BSN  Care Manager  [phone]

## 2018-05-19 NOTE — Progress Notes (Signed)
 Progress Notes by Oris Duwaine HERO, RD at 05/19/18 1455                Author: Oris, Duwaine HERO, RD  Service: Nutrition  Author Type: Registered Dietitian       Filed: 05/19/18 1615  Date of Service: 05/19/18 1455  Status: Addendum          Editor: Schorr, Duwaine HERO, RD (Registered Dietitian)          Related Notes: Original Note by Oris Duwaine HERO, RD (Registered Dietitian) filed at 05/19/18  1554               NUTRITION      Nutrition Consult: General Nutrition Management & Supplements         RECOMMENDATIONS / PLAN:        - Change supplements to ProSource BID.   - Add calcium  citrate supplementation daily.   - Recommend IV magnesium replacement.   - IV fluids per MD.   - Continue RD inpatient monitoring and evaluation.         NUTRITION INTERVENTIONS & DIAGNOSIS:        - Meals/snacks: modify composition   - Medical food supplement therapy: Gelatein TID- modify   - IV fluids 1/2 NS + 30 mEq KCL at 50 mL/hr (36 mEq KCL)   - Vitamin and mineral supplement therapy: cholecalciferol, cyanocobalamin , mag-ox, 80 mEq K Bicarb, MVI BID, thiamine ; add calcium  citrate   - Collaboration and referral of nutrition care: interdisciplinary rounds, discussed nutritional recommendations with Dr. Kimble      Nutrition Diagnosis: Impaired nutrient utilization related to altered GI structure  as evidneced by s/p gastric bypass surgery with multiple electrolyte abnormalities.     Inadequate protein intake related to dislike of nutritional supplements as evidenced by pt with poor intake of protein, not consuming supplements        ASSESSMENT:        1/2: Pt c/o portion sizes and dislike of meals provided stating they are not cooked to her liking. Dislikes Gelatein, not consuming. Hypokalemia persists requiring continued replacement. Only reporting  one loose stool today. Encouraged follow up with outpatient DM, offered assistance with making an appointment however pt states she will call herself.      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 Pt admits to non-compliance with protein drinks/supplements (states premier protein too sweet) but does take daily vitamins. Currently  on cardiac diet with fair intake, noted multiple sugar packets opened at bedside. Education provided on dumping syndrome. Encouraged pt to read nutritional labels and being aware of sugar content of foods/drinks. K and Ca replaced.       Nutritional intake adequate to meet patients estimated nutritional needs:  No      Diet: DIET BARIATRIC MAINTENANCE   DIET NUTRITIONAL SUPPLEMENTS Breakfast, Lunch, Dinner; GELATEIN 20       Food Allergies: lactose   Current Appetite: Good   Appetite/meal intake prior to admission: inadequate; not compliant with protein supplements; 1-2 meals/day   Feeding Limitations:  []  Swallowing difficulty    []  Chewing difficulty    []   Other:   Current Meal Intake:    Patient Vitals for the past 100 hrs:        % Diet Eaten        05/19/18 0916  100 %        05/18/18 1828  100 %  05/18/18 1317  100 %     05/18/18 1018  100 %     05/17/18 1736  100 %     05/17/18 1330  100 %     05/17/18 0830  100 %     05/16/18 1555  85 %        05/16/18 0850  100 %           BM: 12/30   Skin Integrity: WDL   Edema:   [x]  No     []  Yes    Pertinent Medications: Reviewed: ondansetron, pantoprazole        Recent Labs                05/19/18   1246  05/19/18   0536  05/18/18   1122  05/18/18   0143    05/17/18   0516     NA  141  145  147*  146*    < >  144     K  3.1*  3.0*  3.6  2.6*    < >  2.7*     CL  104  107  110  109    < >  109     CO2  32  29  31  30     < >  30     GLU  109*  80  82  81    < >  91     BUN  3*  3*  2*  3*    < >  4*     CREA  0.57*  0.49*  0.48*  0.43*    < >  0.45*     CA  8.4*  8.1*  8.3*  7.8*    < >  7.3*     MG   --   1.5*   --   1.6   --   1.5*        < > = values in this interval not displayed.           Intake/Output Summary (Last 24 hours) at 05/19/2018  1455   Last data filed at 05/19/2018 9277     Gross per 24 hour        Intake  400 ml        Output  400 ml        Net  0 ml           Anthropometrics:     Ht Readings from Last 1 Encounters:        05/16/18  5' 3 (1.6 m)          Last 3 Recorded Weights in this Encounter            05/17/18 0344  05/18/18 0436  05/19/18 0503          Weight:  106.9 kg (235 lb 11.2 oz)  111.1 kg (245 lb)  111.8 kg (246 lb 6.4 oz)        Body mass index is 43.65 kg/m.         Weight History: -38#, 13.9% x 3 months PTA (intentional s/p gastric bypass October 2019)      Weight Metrics  05/19/2018  03/09/2018  03/02/2018  02/21/2018  02/18/2018  02/08/2018  12/17/2017      Weight  246 lb 6.4 oz  258 lb  269 lb  -  273 lb  273 lb  274 lb  BMI  43.65 kg/m2  45.7 kg/m2  47.65 kg/m2  48.36 kg/m2  -  48.36 kg/m2  48.54 kg/m2                Admitting Diagnosis: Hypokalemia [E87.6]   Syncope [R55]   Hypomagnesemia [E83.42]   Pertinent PMHx: COPD, DM, hypercholesterolemia, HTN, lap gastric bypass      Education Needs:        [x]  None identified  []  Identified - Not appropriate at this time  []    Identified and addressed - refer to education log   Learning Limitations:   [x]   None identified  []  Identified     Cultural, religious & ethnic food preferences:  [x]   None identified    []  Identified and addressed         ESTIMATED NUTRITION NEEDS:        Calories: 1579-1895 kcal (MSJx1-1.2) based on  [x]  Actual BW: 107     []   IBW    Protein: 86-107 gm (0.8-1 gm/kg) based on  [x]   Actual BW      []  IBW    Fluid: 1 mL/kcal         MONITORING & EVALUATION:        Nutrition Goal(s):    - PO nutrition intake will meet >75% of patient estimated nutritional needs within the next 7 days.    Outcome: Progressing towards goal       Monitoring:   [x]   Food and nutrient intake   [x]  Food and nutrient administration   [x]  Comparative standards   [x]  Nutrition-focused physical findings   [x]   Anthropometric Measurements   [x]  Treatment/therapy    [x]  Biochemical  data, medical tests, and procedures           Previous Recommendations (for follow-up assessments only):  Implemented        Discharge Planning: bariatric maintenance diet + 0 gm sugar nutritional/protein supplement    Participated in care planning, discharge planning, & interdisciplinary rounds as appropriate.         Duwaine CHRISTELLA Flor, RD   Pager: [phone]

## 2018-05-20 LAB — BASIC METABOLIC PANEL
Anion Gap: 6 mmol/L (ref 3.0–18)
Anion Gap: 7 mmol/L (ref 3.0–18)
BUN: 3 MG/DL — ABNORMAL LOW (ref 7.0–18)
BUN: 3 MG/DL — ABNORMAL LOW (ref 7.0–18)
Bun/Cre Ratio: 7 — ABNORMAL LOW (ref 12–20)
Bun/Cre Ratio: 5 — ABNORMAL LOW (ref 12–20)
CO2: 32 mmol/L (ref 21–32)
CO2: 31 mmol/L (ref 21–32)
Calcium: 8.1 MG/DL — ABNORMAL LOW (ref 8.5–10.1)
Calcium: 9 MG/DL (ref 8.5–10.1)
Chloride: 106 mmol/L (ref 100–111)
Chloride: 103 mmol/L (ref 100–111)
Creatinine: 0.46 MG/DL — ABNORMAL LOW (ref 0.6–1.3)
Creatinine: 0.56 MG/DL — ABNORMAL LOW (ref 0.6–1.3)
EGFR IF NonAfrican American: >60 mL/min/{1.73_m2} (ref >60)
EGFR IF NonAfrican American: >60 mL/min/{1.73_m2} (ref >60)
GFR African American: >60 mL/min/{1.73_m2} (ref >60)
GFR African American: >60 mL/min/{1.73_m2} (ref >60)
Glucose: 84 mg/dL (ref 74–99)
Glucose: 97 mg/dL (ref 74–99)
Potassium: 3.2 mmol/L — ABNORMAL LOW (ref 3.5–5.5)
Potassium: 3.4 mmol/L — ABNORMAL LOW (ref 3.5–5.5)
Sodium: 144 mmol/L (ref 136–145)
Sodium: 141 mmol/L (ref 136–145)

## 2018-05-20 LAB — CBC WITH AUTO DIFFERENTIAL
Basophils %: 0 % (ref 0–2)
Basophils Absolute: 0 10*3/uL (ref 0.0–0.1)
Eosinophils %: 4 % (ref 0–5)
Eosinophils Absolute: 0.2 10*3/uL (ref 0.0–0.4)
Hematocrit: 33.6 % — ABNORMAL LOW (ref 35.0–45.0)
Hemoglobin: 10.5 g/dL — ABNORMAL LOW (ref 12.0–16.0)
Lymphocytes %: 37 % (ref 21–52)
Lymphocytes Absolute: 1.6 10*3/uL (ref 0.9–3.6)
MCH: 27 PG (ref 24.0–34.0)
MCHC: 31.3 g/dL (ref 31.0–37.0)
MCV: 86.4 FL (ref 74.0–97.0)
Monocytes %: 8 % (ref 3–10)
Monocytes Absolute: 0.4 10*3/uL (ref 0.05–1.2)
Neutrophils %: 51 % (ref 40–73)
Neutrophils Absolute: 2.2 10*3/uL (ref 1.8–8.0)
Platelet Comment: DECREASED
Platelets: 106 10*3/uL — ABNORMAL LOW (ref 135–420)
RBC: 3.89 M/uL — ABNORMAL LOW (ref 4.20–5.30)
RDW: 16.1 % — ABNORMAL HIGH (ref 11.6–14.5)
WBC: 4.4 10*3/uL — ABNORMAL LOW (ref 4.6–13.2)

## 2018-05-20 LAB — PHOSPHORUS
Phosphorus: 2.7 MG/DL (ref 2.5–4.9)
Phosphorus: 2.7 MG/DL (ref 2.5–4.9)

## 2018-05-20 LAB — MAGNESIUM
Magnesium: 1.7 mg/dL (ref 1.6–2.6)
Magnesium: 1.7 mg/dL (ref 1.6–2.6)

## 2018-05-20 LAB — CBC WITH AUTOMATED DIFF
ABS. BASOPHILS: 0 10*3/uL (ref 0.0–0.1)
ABS. EOSINOPHILS: 0.2 10*3/uL (ref 0.0–0.4)
ABS. LYMPHOCYTES: 1.6 10*3/uL (ref 0.9–3.6)
ABS. MONOCYTES: 0.4 10*3/uL (ref 0.05–1.2)
ABS. NEUTROPHILS: 2.2 10*3/uL (ref 1.8–8.0)
BASOPHILS: 0 % (ref 0–2)
EOSINOPHILS: 4 % (ref 0–5)
HCT: 33.6 % — ABNORMAL LOW (ref 35.0–45.0)
HGB: 10.5 g/dL — ABNORMAL LOW (ref 12.0–16.0)
LYMPHOCYTES: 37 % (ref 21–52)
MCH: 27 PG (ref 24.0–34.0)
MCHC: 31.3 g/dL (ref 31.0–37.0)
MCV: 86.4 FL (ref 74.0–97.0)
MONOCYTES: 8 % (ref 3–10)
NEUTROPHILS: 51 % (ref 40–73)
PLATELET COMMENTS: DECREASED
PLATELET: 106 10*3/uL — ABNORMAL LOW (ref 135–420)
RBC: 3.89 M/uL — ABNORMAL LOW (ref 4.20–5.30)
RDW: 16.1 % — ABNORMAL HIGH (ref 11.6–14.5)
WBC: 4.4 10*3/uL — ABNORMAL LOW (ref 4.6–13.2)

## 2018-05-20 LAB — METABOLIC PANEL, BASIC
Anion gap: 6 mmol/L (ref 3.0–18)
Anion gap: 7 mmol/L (ref 3.0–18)
BUN/Creatinine ratio: 7 — ABNORMAL LOW (ref 12–20)
BUN/Creatinine ratio: 5 — ABNORMAL LOW (ref 12–20)
BUN: 3 MG/DL — ABNORMAL LOW (ref 7.0–18)
BUN: 3 MG/DL — ABNORMAL LOW (ref 7.0–18)
CO2: 32 mmol/L (ref 21–32)
CO2: 31 mmol/L (ref 21–32)
Calcium: 8.1 MG/DL — ABNORMAL LOW (ref 8.5–10.1)
Calcium: 9 MG/DL (ref 8.5–10.1)
Chloride: 106 mmol/L (ref 100–111)
Chloride: 103 mmol/L (ref 100–111)
Creatinine: 0.46 MG/DL — ABNORMAL LOW (ref 0.6–1.3)
Creatinine: 0.56 MG/DL — ABNORMAL LOW (ref 0.6–1.3)
GFR est AA: >60 mL/min/{1.73_m2} (ref >60)
GFR est AA: >60 mL/min/{1.73_m2} (ref >60)
GFR est non-AA: >60 mL/min/{1.73_m2} (ref >60)
GFR est non-AA: >60 mL/min/{1.73_m2} (ref >60)
Glucose: 84 mg/dL (ref 74–99)
Glucose: 97 mg/dL (ref 74–99)
Potassium: 3.2 mmol/L — ABNORMAL LOW (ref 3.5–5.5)
Potassium: 3.4 mmol/L — ABNORMAL LOW (ref 3.5–5.5)
Sodium: 144 mmol/L (ref 136–145)
Sodium: 141 mmol/L (ref 136–145)

## 2018-05-20 MED ORDER — FLU VACCINE QV 2019-20 (6 MOS+)(PF) 60 MCG (15 MCG X 4)/0.5 ML IM SYRINGE
60 mcg (15 mcg x 4)/0.5 mL | Freq: Once | INTRAMUSCULAR | Status: AC
Start: 2018-05-20 — End: 2018-05-20
  Administered 2018-05-20: 13:00:00 via INTRAMUSCULAR

## 2018-05-20 MED ORDER — MAGNESIUM SULFATE 2 GRAM/50 ML IVPB
2 gram/50 mL (4 %) | Freq: Once | INTRAVENOUS | Status: AC
Start: 2018-05-20 — End: 2018-05-20
  Administered 2018-05-20: 22:00:00 via INTRAVENOUS

## 2018-05-20 MED ORDER — SODIUM CHLORIDE 0.9 % IV
10 mg/mL (1 %) | INTRAVENOUS | Status: AC
Start: 2018-05-20 — End: 2018-05-20
  Administered 2018-05-20 (×2): via INTRAVENOUS

## 2018-05-20 MED FILL — THIAMINE HCL 100 MG TAB: 100 mg | ORAL | Qty: 1

## 2018-05-20 MED FILL — EFFER-K 20 MEQ EFFERVESCENT TABLET: 20 mEq | ORAL | Qty: 2

## 2018-05-20 MED FILL — CALCITRATE 200 MG (950 MG) TABLET: 200 mg (950 mg) | ORAL | Qty: 1

## 2018-05-20 MED FILL — FLUARIX QUAD 2019-2020 (PF) 60 MCG (15 MCG X 4)/0.5 ML IM SYRINGE: 60 mcg (15 mcg x 4)/0.5 mL | INTRAMUSCULAR | Qty: 0.5

## 2018-05-20 MED FILL — POTASSIUM CHLORIDE 2 MEQ/ML IV SOLN: 2 mEq/mL | INTRAVENOUS | Qty: 5

## 2018-05-20 MED FILL — THERAPEUTIC MULTIVITAMIN TAB: ORAL | Qty: 1

## 2018-05-20 MED FILL — LISINOPRIL 40 MG TAB: 40 mg | ORAL | Qty: 1

## 2018-05-20 MED FILL — MAGNESIUM OXIDE 400 MG TAB: 400 mg | ORAL | Qty: 1

## 2018-05-20 MED FILL — CYANOCOBALAMIN 1,000 MCG TAB: 1000 mcg | ORAL | Qty: 1

## 2018-05-20 MED FILL — CHOLECALCIFEROL (VITAMIN D3) 5,000 UNIT CAP: ORAL | Qty: 1

## 2018-05-20 MED FILL — LOVENOX 40 MG/0.4 ML SUBCUTANEOUS SYRINGE: 40 mg/0.4 mL | SUBCUTANEOUS | Qty: 0.4

## 2018-05-20 MED FILL — PANTOPRAZOLE 40 MG TAB, DELAYED RELEASE: 40 mg | ORAL | Qty: 1

## 2018-05-20 MED FILL — MAGNESIUM SULFATE 2 GRAM/50 ML IVPB: 2 gram/50 mL (4 %) | INTRAVENOUS | Qty: 50

## 2018-05-20 NOTE — Other (Addendum)
Bedside shift change report given to Marylene Land RN (oncoming nurse) by Blair Hailey   RN (offgoing nurse). Report included the following information SBAR, Kardex, Intake/Output and Recent Results.    Uneventful shift

## 2018-05-20 NOTE — Progress Notes (Addendum)
NUTRITION    Nutrition Consult: General Nutrition Management & Supplements    RECOMMENDATIONS / PLAN:     - Continue vitamin/mineral supplementation & additional protein supplement (ProSource BID).  - Potassium rich food list provided & discussed today.  - MD monitoring and actively replacing electrolytes.  - Continue IV fluids.  - Continue RD inpatient monitoring and evaluation.     NUTRITION INTERVENTIONS & DIAGNOSIS:     - Meals/snacks: modify composition  - Medical food supplement therapy: ProSource BID  - IV fluids 1/2 NS + 30 mEq KCL at 50 mL/hr (36 mEq KCL)  - Vitamin and mineral supplement therapy: calcium citrate, cholecalciferol, cyanocobalamin, mag-ox, 80 mEq K Bicarb, MVI BID, thiamine, (2 gm Mg & 20 mEq IV KCL ordered)   - Nutrition Education: K rich food list provided 05/20/17  - Collaboration and referral of nutrition care: interdisciplinary rounds, MD to check and monitor phosphorus    Nutrition Diagnosis: Impaired nutrient utilization related to altered GI structure as evidneced by s/p gastric bypass surgery with multiple electrolyte abnormalities.    Inadequate protein intake related to dislike of nutritional supplements as evidenced by pt with poor intake of protein, not consuming supplements    ASSESSMENT:     1/3: Pt has not received supplement and unable to try yet. Good intake today but c/o difficulty consuming oral K replacement 2/2 fluid volume. IV replacement ordered. Potassium rich food list provided per pt request and reinforced importance of taking vitamin/mineral supplementation along with electrolyte replacement. Phos to be checked.    1/2: Pt c/o portion sizes and dislike of meals provided stating they are not cooked to her liking. Dislikes Gelatein, not consuming. Hypokalemia persists requiring continued replacement. Only reporting one loose stool today. Encouraged follow up with outpatient DM, offered assistance with making an appointment however pt states she will call herself.      12/31: Pt s/p gastric bypass in October 2019 with c/o diarrhea and multiple electrolyte abnormalities, being repleted. Pt admits to non-compliance with protein drinks/supplements (states premier protein too sweet) but does take daily vitamins. Currently on cardiac diet with fair intake, noted multiple sugar packets opened at bedside. Education provided on dumping syndrome. Encouraged pt to read nutritional labels and being aware of sugar content of foods/drinks. K and Ca replaced.     Nutritional intake adequate to meet patients estimated nutritional needs:  No    Diet: DIET BARIATRIC MAINTENANCE  DIET NUTRITIONAL SUPPLEMENTS Breakfast, Dinner; PROSOURCE      Food Allergies: lactose  Current Appetite: Good  Appetite/meal intake prior to admission: inadequate; not compliant with protein supplements; 1-2 meals/day  Feeding Limitations:  []  Swallowing difficulty    []  Chewing difficulty    []  Other:  Current Meal Intake:   Patient Vitals for the past 100 hrs:   % Diet Eaten   05/20/18 1004 100 %   05/19/18 0916 100 %   05/18/18 1828 100 %   05/18/18 1317 100 %   05/18/18 1018 100 %   05/17/18 1736 100 %   05/17/18 1330 100 %   05/17/18 0830 100 %   05/16/18 1555 85 %       BM: 1/2  Skin Integrity: WDL  Edema:   [x]  No     []  Yes   Pertinent Medications: Reviewed: ondansetron, pantoprazole    Recent Labs     05/20/18  0510 05/19/18  1246 05/19/18  0536  05/18/18  0143   NA 144 141 145   < >  146*   K 3.2* 3.1* 3.0*   < > 2.6*   CL 106 104 107   < > 109   CO2 32 32 29   < > 30   GLU 84 109* 80   < > 81   BUN 3* 3* 3*   < > 3*   CREA 0.46* 0.57* 0.49*   < > 0.43*   CA 8.1* 8.4* 8.1*   < > 7.8*   MG 1.7  --  1.5*  --  1.6    < > = values in this interval not displayed.       Intake/Output Summary (Last 24 hours) at 05/20/2018 1256  Last data filed at 05/20/2018 1004  Gross per 24 hour   Intake 387.5 ml   Output 1025 ml   Net -637.5 ml       Anthropometrics:  Ht Readings from Last 1 Encounters:   05/16/18 5\' 3"  (1.6 m)      Last 3 Recorded Weights in this Encounter    05/17/18 0344 05/18/18 0436 05/19/18 0503   Weight: 106.9 kg (235 lb 11.2 oz) 111.1 kg (245 lb) 111.8 kg (246 lb 6.4 oz)     Body mass index is 43.65 kg/m??.   Obese Class III    Weight History: -38#, 13.9% x 3 months PTA (intentional s/p gastric bypass October 2019)    Weight Metrics 05/19/2018 03/09/2018 03/02/2018 02/21/2018 02/18/2018 02/08/2018 12/17/2017   Weight 246 lb 6.4 oz 258 lb 269 lb - 273 lb 273 lb 274 lb   BMI 43.65 kg/m2 45.7 kg/m2 47.65 kg/m2 48.36 kg/m2 - 48.36 kg/m2 48.54 kg/m2        Admitting Diagnosis: Hypokalemia [E87.6]  Syncope [R55]  Hypomagnesemia [E83.42]  Pertinent PMHx: COPD, DM, hypercholesterolemia, HTN, lap gastric bypass    Education Needs:        []  None identified  []  Identified - Not appropriate at this time  [x]   Identified and addressed - refer to education log  Learning Limitations:   [x]  None identified  []  Identified    Cultural, religious & ethnic food preferences:  [x]  None identified    []  Identified and addressed     ESTIMATED NUTRITION NEEDS:     Calories: 1579-1895 kcal (MSJx1-1.2) based on  [x]  Actual BW: 107     []  IBW   Protein: 86-107 gm (0.8-1 gm/kg) based on  [x]  Actual BW      []  IBW   Fluid: 1 mL/kcal     MONITORING & EVALUATION:     Nutrition Goal(s):   - PO nutrition intake will meet >75% of patient estimated nutritional needs within the next 7 days.   Outcome: Progressing towards goal     Monitoring:   [x]  Food and nutrient intake   [x]  Food and nutrient administration  [x]  Comparative standards   [x]  Nutrition-focused physical findings   [x]  Anthropometric Measurements   [x]  Treatment/therapy   [x]  Biochemical data, medical tests, and procedures        Previous Recommendations (for follow-up assessments only):  Implemented      Discharge Planning: bariatric maintenance diet + 0 gm sugar nutritional/protein supplement; follow up with outpatient RD (pt reports she will make her own appointment, refused assistance)   Participated in care planning, discharge planning, & interdisciplinary rounds as appropriate.      De Hollingshead, RD  Pager: [phone]

## 2018-05-20 NOTE — Other (Signed)
Bedside shift change report given to Tammy, RN (oncoming nurse) by Kari, RN (offgoing nurse). Report included the following information SBAR, Kardex, Intake/Output and Recent Results.

## 2018-05-20 NOTE — Progress Notes (Signed)
[facility] Hospitalist Group  Progress Note    Patient: Cheryl Paul Age: 68 y.o. DOB: [birth year] MR#: [id number] SSN: [id number]  Date: 05/20/2018     Subjective/24-hour events:     Sitting in chair at bedside.  No new complaints.    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  Check phosphorus.   Continue bariatric diet.  Mobilize as much as able/tolerated.  Supportive care.  Follow.    Case discussed with:  [x] Patient  [] Family  [x] Nursing  [x] Case Management  DVT Prophylaxis:  [x] Lovenox  [] Hep SQ  [] SCDs  [] Coumadin   [] On Heparin gtt    Objective:   VS:   Visit Vitals  BP 149/78 (BP 1 Location: Left arm, BP Patient Position: At rest)   Pulse 75   Temp 98.3 ??F (36.8 ??C)   Resp 16   Ht 5\' 3"  (1.6 m)   Wt 111.8 kg (246 lb 6.4 oz)   SpO2 100%   BMI 43.65 kg/m??      Tmax/24hrs: Temp (24hrs), Avg:98.1 ??F (36.7 ??C), Min:97.8 ??F (36.6 ??C), Max:98.3 ??F (36.8 ??C)      Intake/Output Summary (Last 24 hours) at 05/20/2018 1249  Last data filed at 05/20/2018 1004  Gross per 24 hour   Intake 387.5 ml   Output 1025 ml   Net -637.5 ml       General: In NAD.  Cardiovascular: RRR.  Pulmonary: Lungs clear, no wheezes.  Effort nonlabored.  GI: Abdomen soft.  Extremities: Warm, no edema.  Neuro: Awake and alert, motor nonfocal.    Labs:    Recent Results (from the past 24 hour(s))   CBC WITH AUTOMATED DIFF    Collection Time: 05/20/18  5:10 AM   Result Value Ref Range    WBC 4.4 (L) 4.6 - 13.2 K/uL    RBC 3.89 (L) 4.20 - 5.30 M/uL    HGB 10.5 (L) 12.0 - 16.0 g/dL    HCT 40.3 (L) 47.4 - 45.0 %    MCV 86.4 74.0 - 97.0 FL    MCH 27.0 24.0 - 34.0 PG    MCHC 31.3 31.0 - 37.0 g/dL     RDW 25.9 (H) 56.3 - 14.5 %    PLATELET 106 (L) 135 - 420 K/uL    NEUTROPHILS 51 40 - 73 %    LYMPHOCYTES 37 21 - 52 %    MONOCYTES 8 3 - 10 %    EOSINOPHILS 4 0 - 5 %    BASOPHILS 0 0 - 2 %    ABS. NEUTROPHILS 2.2 1.8 - 8.0 K/UL    ABS. LYMPHOCYTES 1.6 0.9 - 3.6 K/UL    ABS. MONOCYTES 0.4 0.05 - 1.2 K/UL    ABS. EOSINOPHILS 0.2 0.0 - 0.4 K/UL    ABS. BASOPHILS 0.0 0.0 - 0.1 K/UL    DF AUTOMATED      PLATELET COMMENTS DECREASED PLATELETS      RBC COMMENTS ANISOCYTOSIS  1+        RBC COMMENTS POIKILOCYTOSIS  1+        RBC COMMENTS HYPOCHROMIA  1+       MAGNESIUM    Collection Time: 05/20/18  5:10 AM  Result Value Ref Range    Magnesium 1.7 1.6 - 2.6 mg/dL   METABOLIC PANEL, BASIC    Collection Time: 05/20/18  5:10 AM   Result Value Ref Range    Sodium 144 136 - 145 mmol/L    Potassium 3.2 (L) 3.5 - 5.5 mmol/L    Chloride 106 100 - 111 mmol/L    CO2 32 21 - 32 mmol/L    Anion gap 6 3.0 - 18 mmol/L    Glucose 84 74 - 99 mg/dL    BUN 3 (L) 7.0 - 18 MG/DL    Creatinine 7.35 (L) 0.6 - 1.3 MG/DL    BUN/Creatinine ratio 7 (L) 12 - 20      GFR est AA >60 >60 ml/min/1.40m2    GFR est non-AA >60 >60 ml/min/1.57m2    Calcium 8.1 (L) 8.5 - 10.1 MG/DL       Signed By: Areatha Keas, MD     May 20, 2018

## 2018-05-20 NOTE — Progress Notes (Signed)
 Progress Notes by Oris Duwaine HERO, RD at 05/20/18 1256                Author: Oris, Duwaine HERO, RD  Service: Nutrition  Author Type: Registered Dietitian       Filed: 05/20/18 1320  Date of Service: 05/20/18 1256  Status: Addendum          Editor: Schorr, Duwaine HERO, RD (Registered Dietitian)          Related Notes: Original Note by Oris Duwaine HERO, RD (Registered Dietitian) filed at 05/20/18  1318               NUTRITION      Nutrition Consult: General Nutrition Management & Supplements        RECOMMENDATIONS / PLAN:        - Continue vitamin/mineral supplementation & additional protein supplement (ProSource BID).   - Potassium rich food list provided & discussed today.   - MD monitoring and actively replacing electrolytes.   - Continue IV fluids.   - Continue RD inpatient monitoring and evaluation.         NUTRITION INTERVENTIONS & DIAGNOSIS:        - Meals/snacks: modify composition   - Medical food supplement therapy: ProSource BID   - IV fluids 1/2 NS + 30 mEq KCL at 50 mL/hr (36 mEq KCL)   - Vitamin and mineral supplement therapy: calcium  citrate, cholecalciferol, cyanocobalamin , mag-ox, 80 mEq K Bicarb, MVI BID, thiamine , (2 gm Mg & 20 mEq IV KCL ordered)    - Nutrition Education: K rich food list provided 05/20/17   - Collaboration and referral of nutrition care: interdisciplinary rounds, MD to check and monitor phosphorus      Nutrition Diagnosis: Impaired nutrient utilization related to altered GI structure  as evidneced by s/p gastric bypass surgery with multiple electrolyte abnormalities.     Inadequate protein intake related to dislike of nutritional supplements as evidenced by pt with poor intake of protein, not consuming supplements        ASSESSMENT:        1/3: Pt has not received supplement and unable to try yet. Good intake today but c/o difficulty consuming oral K replacement 2/2 fluid volume. IV replacement ordered. Potassium rich food list provided  per pt request and reinforced importance of  taking vitamin/mineral supplementation along with electrolyte replacement. Phos to be checked.      1/2: Pt c/o portion sizes and dislike of meals provided stating they are not cooked to her liking. Dislikes Gelatein, not consuming. Hypokalemia persists requiring continued replacement. Only reporting one loose stool today. Encouraged follow up with  outpatient DM, offered assistance with making an appointment however pt states she will call herself.      12/31: Pt s/p gastric bypass in October 2019 with c/o diarrhea and multiple electrolyte abnormalities, being repleted. Pt admits to non-compliance with protein drinks/supplements (states premier protein too sweet) but does take daily vitamins. Currently  on cardiac diet with fair intake, noted multiple sugar packets opened at bedside. Education provided on dumping syndrome. Encouraged pt to read nutritional labels and being aware of sugar content of foods/drinks. K and Ca replaced.       Nutritional intake adequate to meet patients estimated nutritional needs:  No      Diet: DIET BARIATRIC MAINTENANCE   DIET NUTRITIONAL SUPPLEMENTS Breakfast, Dinner; PROSOURCE       Food Allergies: lactose   Current Appetite: Good   Appetite/meal intake prior  to admission: inadequate; not compliant with protein supplements; 1-2 meals/day   Feeding Limitations:  []  Swallowing difficulty    []  Chewing difficulty    []   Other:   Current Meal Intake:    Patient Vitals for the past 100 hrs:        % Diet Eaten        05/20/18 1004  100 %        05/19/18 0916  100 %     05/18/18 1828  100 %     05/18/18 1317  100 %     05/18/18 1018  100 %     05/17/18 1736  100 %     05/17/18 1330  100 %     05/17/18 0830  100 %        05/16/18 1555  85 %           BM: 1/2   Skin Integrity: WDL   Edema:   [x]  No     []  Yes    Pertinent Medications: Reviewed: ondansetron, pantoprazole        Recent Labs               05/20/18   0510  05/19/18   1246  05/19/18   0536    05/18/18   0143     NA  144  141   145    < >  146*     K  3.2*  3.1*  3.0*    < >  2.6*     CL  106  104  107    < >  109     CO2  32  32  29    < >  30     GLU  84  109*  80    < >  81     BUN  3*  3*  3*    < >  3*     CREA  0.46*  0.57*  0.49*    < >  0.43*     CA  8.1*  8.4*  8.1*    < >  7.8*     MG  1.7   --   1.5*   --   1.6        < > = values in this interval not displayed.           Intake/Output Summary (Last 24 hours) at 05/20/2018 1256   Last data filed at 05/20/2018 1004     Gross per 24 hour        Intake  387.5 ml        Output  1025 ml        Net  -637.5 ml           Anthropometrics:     Ht Readings from Last 1 Encounters:        05/16/18  5' 3 (1.6 m)          Last 3 Recorded Weights in this Encounter            05/17/18 0344  05/18/18 0436  05/19/18 0503          Weight:  106.9 kg (235 lb 11.2 oz)  111.1 kg (245 lb)  111.8 kg (246 lb 6.4 oz)        Body mass index is 43.65 kg/m.   Obese Class III      Weight History: -38#, 13.9% x  3 months PTA (intentional s/p gastric bypass October 2019)      Weight Metrics  05/19/2018  03/09/2018  03/02/2018  02/21/2018  02/18/2018  02/08/2018  12/17/2017      Weight  246 lb 6.4 oz  258 lb  269 lb  -  273 lb  273 lb  274 lb      BMI  43.65 kg/m2  45.7 kg/m2  47.65 kg/m2  48.36 kg/m2  -  48.36 kg/m2  48.54 kg/m2                Admitting Diagnosis: Hypokalemia [E87.6]   Syncope [R55]   Hypomagnesemia [E83.42]   Pertinent PMHx: COPD, DM, hypercholesterolemia, HTN, lap gastric bypass      Education Needs:        []  None identified  []  Identified - Not appropriate at this time  [x]    Identified and addressed - refer to education log   Learning Limitations:   [x]   None identified  []  Identified     Cultural, religious & ethnic food preferences:  [x]   None identified    []  Identified and addressed         ESTIMATED NUTRITION NEEDS:        Calories: 1579-1895 kcal (MSJx1-1.2) based on  [x]  Actual BW: 107     []   IBW    Protein: 86-107 gm (0.8-1 gm/kg) based on  [x]   Actual BW      []  IBW    Fluid: 1 mL/kcal          MONITORING & EVALUATION:        Nutrition Goal(s):    - PO nutrition intake will meet >75% of patient estimated nutritional needs within the next 7 days.    Outcome: Progressing towards goal       Monitoring:   [x]   Food and nutrient intake   [x]  Food and nutrient administration   [x]  Comparative standards   [x]  Nutrition-focused physical findings   [x]   Anthropometric Measurements   [x]  Treatment/therapy    [x]  Biochemical data, medical tests, and procedures           Previous Recommendations (for follow-up assessments only):  Implemented        Discharge Planning: bariatric maintenance diet + 0 gm sugar nutritional/protein supplement; follow up with outpatient RD (pt reports she will  make her own appointment, refused assistance)   Participated in care planning, discharge planning, & interdisciplinary rounds as appropriate.         Duwaine CHRISTELLA Flor, RD   Pager: [phone]

## 2018-05-20 NOTE — Progress Notes (Signed)
[facility] Hospitalist Group  Progress Note    Patient: Cheryl Paul Age: 68 y.o. DOB: 04-Oct-1950 MR#: [id number] SSN: [id number]  Date: 05/20/2018     Subjective/24-hour events:     Sitting in chair at bedside.  No new complaints.    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  Check phosphorus.   Continue bariatric diet.  Mobilize as much as able/tolerated.  Supportive care.  Follow.    Case discussed with:  [x] Patient  [] Family  [x] Nursing  [x] Case Management  DVT Prophylaxis:  [x] Lovenox  [] Hep SQ  [] SCDs  [] Coumadin   [] On Heparin gtt    Objective:   VS:   Visit Vitals  BP 149/78 (BP 1 Location: Left arm, BP Patient Position: At rest)   Pulse 75   Temp 98.3 ??F (36.8 ??C)   Resp 16   Ht 5\' 3"  (1.6 m)   Wt 111.8 kg (246 lb 6.4 oz)   SpO2 100%   BMI 43.65 kg/m??      Tmax/24hrs: Temp (24hrs), Avg:98.1 ??F (36.7 ??C), Min:97.8 ??F (36.6 ??C), Max:98.3 ??F (36.8 ??C)      Intake/Output Summary (Last 24 hours) at 05/20/2018 1249  Last data filed at 05/20/2018 1004  Gross per 24 hour   Intake 387.5 ml   Output 1025 ml   Net -637.5 ml       General: In NAD.  Cardiovascular: RRR.  Pulmonary: Lungs clear, no wheezes.  Effort nonlabored.  GI: Abdomen soft.  Extremities: Warm, no edema.  Neuro: Awake and alert, motor nonfocal.    Labs:    Recent Results (from the past 24 hour(s))   CBC WITH AUTOMATED DIFF    Collection Time: 05/20/18  5:10 AM   Result Value Ref Range    WBC 4.4 (L) 4.6 - 13.2 K/uL    RBC 3.89 (L) 4.20 - 5.30 M/uL    HGB 10.5 (L) 12.0 - 16.0 g/dL    HCT 79.8 (L) 10.2 - 45.0 %    MCV 86.4 74.0 - 97.0 FL    MCH 27.0 24.0 - 34.0 PG    MCHC 31.3 31.0 - 37.0 g/dL    RDW 54.8 (H) 62.8 -  14.5 %    PLATELET 106 (L) 135 - 420 K/uL    NEUTROPHILS 51 40 - 73 %    LYMPHOCYTES 37 21 - 52 %    MONOCYTES 8 3 - 10 %    EOSINOPHILS 4 0 - 5 %    BASOPHILS 0 0 - 2 %    ABS. NEUTROPHILS 2.2 1.8 - 8.0 K/UL    ABS. LYMPHOCYTES 1.6 0.9 - 3.6 K/UL    ABS. MONOCYTES 0.4 0.05 - 1.2 K/UL    ABS. EOSINOPHILS 0.2 0.0 - 0.4 K/UL    ABS. BASOPHILS 0.0 0.0 - 0.1 K/UL    DF AUTOMATED      PLATELET COMMENTS DECREASED PLATELETS      RBC COMMENTS ANISOCYTOSIS  1+        RBC COMMENTS POIKILOCYTOSIS  1+        RBC COMMENTS HYPOCHROMIA  1+       MAGNESIUM    Collection Time: 05/20/18  5:10 AM  Result Value Ref Range    Magnesium 1.7 1.6 - 2.6 mg/dL   METABOLIC PANEL, BASIC    Collection Time: 05/20/18  5:10 AM   Result Value Ref Range    Sodium 144 136 - 145 mmol/L    Potassium 3.2 (L) 3.5 - 5.5 mmol/L    Chloride 106 100 - 111 mmol/L    CO2 32 21 - 32 mmol/L    Anion gap 6 3.0 - 18 mmol/L    Glucose 84 74 - 99 mg/dL    BUN 3 (L) 7.0 - 18 MG/DL    Creatinine 7.34 (L) 0.6 - 1.3 MG/DL    BUN/Creatinine ratio 7 (L) 12 - 20      GFR est AA >60 >60 ml/min/1.52m2    GFR est non-AA >60 >60 ml/min/1.61m2    Calcium 8.1 (L) 8.5 - 10.1 MG/DL       Signed By: Areatha Keas, MD     May 20, 2018

## 2018-05-21 LAB — CBC WITH AUTO DIFFERENTIAL
Basophils %: 1 % (ref 0–2)
Basophils Absolute: 0 10*3/uL (ref 0.0–0.1)
Eosinophils %: 4 % (ref 0–5)
Eosinophils Absolute: 0.2 10*3/uL (ref 0.0–0.4)
Hematocrit: 32.6 % — ABNORMAL LOW (ref 35.0–45.0)
Hemoglobin: 10.3 g/dL — ABNORMAL LOW (ref 12.0–16.0)
Lymphocytes %: 35 % (ref 21–52)
Lymphocytes Absolute: 1.6 10*3/uL (ref 0.9–3.6)
MCH: 27.5 PG (ref 24.0–34.0)
MCHC: 31.6 g/dL (ref 31.0–37.0)
MCV: 86.9 FL (ref 74.0–97.0)
Monocytes %: 7 % (ref 3–10)
Monocytes Absolute: 0.3 10*3/uL (ref 0.05–1.2)
Neutrophils %: 53 % (ref 40–73)
Neutrophils Absolute: 2.4 10*3/uL (ref 1.8–8.0)
Platelets: 101 10*3/uL — ABNORMAL LOW (ref 135–420)
RBC: 3.75 M/uL — ABNORMAL LOW (ref 4.20–5.30)
RDW: 16.2 % — ABNORMAL HIGH (ref 11.6–14.5)
WBC: 4.5 10*3/uL — ABNORMAL LOW (ref 4.6–13.2)

## 2018-05-21 LAB — BASIC METABOLIC PANEL
Anion Gap: 5 mmol/L (ref 3.0–18)
BUN: 7 MG/DL (ref 7.0–18)
Bun/Cre Ratio: 15 (ref 12–20)
CO2: 32 mmol/L (ref 21–32)
Calcium: 8.3 MG/DL — ABNORMAL LOW (ref 8.5–10.1)
Chloride: 105 mmol/L (ref 100–111)
Creatinine: 0.47 MG/DL — ABNORMAL LOW (ref 0.6–1.3)
EGFR IF NonAfrican American: >60 mL/min/{1.73_m2} (ref >60)
GFR African American: >60 mL/min/{1.73_m2} (ref >60)
Glucose: 88 mg/dL (ref 74–99)
Potassium: 3.5 mmol/L (ref 3.5–5.5)
Sodium: 142 mmol/L (ref 136–145)

## 2018-05-21 LAB — MAGNESIUM
Magnesium: 2 mg/dL (ref 1.6–2.6)
Magnesium: 2 mg/dL (ref 1.6–2.6)

## 2018-05-21 LAB — PHOSPHORUS
Phosphorus: 3.4 MG/DL (ref 2.5–4.9)
Phosphorus: 3.4 MG/DL (ref 2.5–4.9)

## 2018-05-21 LAB — CBC WITH AUTOMATED DIFF
ABS. BASOPHILS: 0 10*3/uL (ref 0.0–0.1)
ABS. EOSINOPHILS: 0.2 10*3/uL (ref 0.0–0.4)
ABS. LYMPHOCYTES: 1.6 10*3/uL (ref 0.9–3.6)
ABS. MONOCYTES: 0.3 10*3/uL (ref 0.05–1.2)
ABS. NEUTROPHILS: 2.4 10*3/uL (ref 1.8–8.0)
BASOPHILS: 1 % (ref 0–2)
EOSINOPHILS: 4 % (ref 0–5)
HCT: 32.6 % — ABNORMAL LOW (ref 35.0–45.0)
HGB: 10.3 g/dL — ABNORMAL LOW (ref 12.0–16.0)
LYMPHOCYTES: 35 % (ref 21–52)
MCH: 27.5 PG (ref 24.0–34.0)
MCHC: 31.6 g/dL (ref 31.0–37.0)
MCV: 86.9 FL (ref 74.0–97.0)
MONOCYTES: 7 % (ref 3–10)
NEUTROPHILS: 53 % (ref 40–73)
PLATELET: 101 10*3/uL — ABNORMAL LOW (ref 135–420)
RBC: 3.75 M/uL — ABNORMAL LOW (ref 4.20–5.30)
RDW: 16.2 % — ABNORMAL HIGH (ref 11.6–14.5)
WBC: 4.5 10*3/uL — ABNORMAL LOW (ref 4.6–13.2)

## 2018-05-21 LAB — METABOLIC PANEL, BASIC
Anion gap: 5 mmol/L (ref 3.0–18)
BUN/Creatinine ratio: 15 (ref 12–20)
BUN: 7 MG/DL (ref 7.0–18)
CO2: 32 mmol/L (ref 21–32)
Calcium: 8.3 MG/DL — ABNORMAL LOW (ref 8.5–10.1)
Chloride: 105 mmol/L (ref 100–111)
Creatinine: 0.47 MG/DL — ABNORMAL LOW (ref 0.6–1.3)
GFR est AA: >60 mL/min/{1.73_m2} (ref >60)
GFR est non-AA: >60 mL/min/{1.73_m2} (ref >60)
Glucose: 88 mg/dL (ref 74–99)
Potassium: 3.5 mmol/L (ref 3.5–5.5)
Sodium: 142 mmol/L (ref 136–145)

## 2018-05-21 MED FILL — CALCITRATE 200 MG (950 MG) TABLET: 200 mg (950 mg) | ORAL | Qty: 1

## 2018-05-21 MED FILL — CHOLECALCIFEROL (VITAMIN D3) 5,000 UNIT CAP: ORAL | Qty: 1

## 2018-05-21 MED FILL — CYANOCOBALAMIN 1,000 MCG TAB: 1000 mcg | ORAL | Qty: 1

## 2018-05-21 MED FILL — MAGNESIUM OXIDE 400 MG TAB: 400 mg | ORAL | Qty: 1

## 2018-05-21 MED FILL — LISINOPRIL 40 MG TAB: 40 mg | ORAL | Qty: 1

## 2018-05-21 MED FILL — LOVENOX 40 MG/0.4 ML SUBCUTANEOUS SYRINGE: 40 mg/0.4 mL | SUBCUTANEOUS | Qty: 0.4

## 2018-05-21 MED FILL — EFFER-K 20 MEQ EFFERVESCENT TABLET: 20 mEq | ORAL | Qty: 2

## 2018-05-21 MED FILL — PANTOPRAZOLE 40 MG TAB, DELAYED RELEASE: 40 mg | ORAL | Qty: 1

## 2018-05-21 MED FILL — THERAPEUTIC MULTIVITAMIN TAB: ORAL | Qty: 1

## 2018-05-21 MED FILL — SODIUM CHLORIDE 0.45 % IV: 0.45 % | INTRAVENOUS | Qty: 1000

## 2018-05-21 NOTE — Progress Notes (Addendum)
Bedside shift change report given to Tammy, RN (oncoming nurse) by Angela, RN (offgoing nurse). Report included the following information SBAR, Kardex and MAR.

## 2018-05-21 NOTE — Progress Notes (Signed)
[facility] Hospitalist Group  Progress Note    Patient: Cheryl Paul Age: 68 y.o. DOB: Sep 05, 1950 MR#: [id number] SSN: [id number]  Date: 05/21/2018     Subjective/24-hour events:     Sitting in chair at bedside.  No new complaints.    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  Follow up labs again in AM.  Possible discharge if no significant changes.    Case discussed with:  [x] Patient  [] Family  [x] Nursing  [] Case Management  DVT Prophylaxis:  [x] Lovenox  [] Hep SQ  [] SCDs  [] Coumadin   [] On Heparin gtt    Objective:   VS:   Visit Vitals  BP 152/76 (BP 1 Location: Left arm, BP Patient Position: At rest)   Pulse 81   Temp 98.3 ??F (36.8 ??C)   Resp 18   Ht 5\' 3"  (1.6 m)   Wt 111.8 kg (246 lb 6.4 oz)   SpO2 100%   BMI 43.65 kg/m??      Tmax/24hrs: Temp (24hrs), Avg:98.4 ??F (36.9 ??C), Min:97.4 ??F (36.3 ??C), Max:99.1 ??F (37.3 ??C)      Intake/Output Summary (Last 24 hours) at 05/21/2018 1144  Last data filed at 05/20/2018 1901  Gross per 24 hour   Intake 610 ml   Output 800 ml   Net -190 ml       General: In NAD.  Cardiovascular: RRR.  Pulmonary: Lungs clear, no wheezes.  Effort nonlabored.  GI: Abdomen soft.  Extremities: Warm, no edema.  Neuro: Awake and alert, motor nonfocal.    Labs:    Recent Results (from the past 24 hour(s))   METABOLIC PANEL, BASIC    Collection Time: 05/20/18 12:48 PM   Result Value Ref Range    Sodium 141 136 - 145 mmol/L    Potassium 3.4 (L) 3.5 - 5.5 mmol/L    Chloride 103 100 - 111 mmol/L    CO2 31 21 - 32 mmol/L    Anion gap 7 3.0 - 18 mmol/L    Glucose 97 74 - 99 mg/dL    BUN 3 (L) 7.0 - 18 MG/DL    Creatinine 2.26 (L) 0.6 - 1.3 MG/DL     BUN/Creatinine ratio 5 (L) 12 - 20      GFR est AA >60 >60 ml/min/1.58m2    GFR est non-AA >60 >60 ml/min/1.63m2    Calcium 9.0 8.5 - 10.1 MG/DL   PHOSPHORUS    Collection Time: 05/20/18 12:48 PM   Result Value Ref Range    Phosphorus 2.7 2.5 - 4.9 MG/DL   CBC WITH AUTOMATED DIFF    Collection Time: 05/21/18  1:57 AM   Result Value Ref Range    WBC 4.5 (L) 4.6 - 13.2 K/uL    RBC 3.75 (L) 4.20 - 5.30 M/uL    HGB 10.3 (L) 12.0 - 16.0 g/dL    HCT 33.3 (L) 54.5 - 45.0 %    MCV 86.9 74.0 - 97.0 FL    MCH 27.5 24.0 - 34.0 PG    MCHC 31.6 31.0 - 37.0 g/dL    RDW 62.5 (H) 63.8 - 14.5 %    PLATELET 101 (L) 135 - 420 K/uL    NEUTROPHILS 53 40 -  73 %    LYMPHOCYTES 35 21 - 52 %    MONOCYTES 7 3 - 10 %    EOSINOPHILS 4 0 - 5 %    BASOPHILS 1 0 - 2 %    ABS. NEUTROPHILS 2.4 1.8 - 8.0 K/UL    ABS. LYMPHOCYTES 1.6 0.9 - 3.6 K/UL    ABS. MONOCYTES 0.3 0.05 - 1.2 K/UL    ABS. EOSINOPHILS 0.2 0.0 - 0.4 K/UL    ABS. BASOPHILS 0.0 0.0 - 0.1 K/UL    DF AUTOMATED     MAGNESIUM    Collection Time: 05/21/18  1:57 AM   Result Value Ref Range    Magnesium 2.0 1.6 - 2.6 mg/dL   PHOSPHORUS    Collection Time: 05/21/18  1:57 AM   Result Value Ref Range    Phosphorus 3.4 2.5 - 4.9 MG/DL   METABOLIC PANEL, BASIC    Collection Time: 05/21/18  1:57 AM   Result Value Ref Range    Sodium 142 136 - 145 mmol/L    Potassium 3.5 3.5 - 5.5 mmol/L    Chloride 105 100 - 111 mmol/L    CO2 32 21 - 32 mmol/L    Anion gap 5 3.0 - 18 mmol/L    Glucose 88 74 - 99 mg/dL    BUN 7 7.0 - 18 MG/DL    Creatinine 8.45 (L) 0.6 - 1.3 MG/DL    BUN/Creatinine ratio 15 12 - 20      GFR est AA >60 >60 ml/min/1.24m2    GFR est non-AA >60 >60 ml/min/1.69m2    Calcium 8.3 (L) 8.5 - 10.1 MG/DL       Signed By: Areatha Keas, MD     May 21, 2018

## 2018-05-21 NOTE — Progress Notes (Signed)
Bedside shift change report given to Tammy, RN (oncoming nurse) by Angela, RN (offgoing nurse). Report included the following information SBAR, Kardex and MAR.

## 2018-05-21 NOTE — Progress Notes (Signed)
[facility] Hospitalist Group  Progress Note    Patient: Cheryl Paul Age: 68 y.o. DOB: 1951-04-18 MR#: [id number] SSN: [id number]  Date: 05/21/2018     Subjective/24-hour events:     Sitting in chair at bedside.  No new complaints.    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  Follow up labs again in AM.  Possible discharge if no significant changes.    Case discussed with:  [x] Patient  [] Family  [x] Nursing  [] Case Management  DVT Prophylaxis:  [x] Lovenox  [] Hep SQ  [] SCDs  [] Coumadin   [] On Heparin gtt    Objective:   VS:   Visit Vitals  BP 152/76 (BP 1 Location: Left arm, BP Patient Position: At rest)   Pulse 81   Temp 98.3 ??F (36.8 ??C)   Resp 18   Ht 5\' 3"  (1.6 m)   Wt 111.8 kg (246 lb 6.4 oz)   SpO2 100%   BMI 43.65 kg/m??      Tmax/24hrs: Temp (24hrs), Avg:98.4 ??F (36.9 ??C), Min:97.4 ??F (36.3 ??C), Max:99.1 ??F (37.3 ??C)      Intake/Output Summary (Last 24 hours) at 05/21/2018 1144  Last data filed at 05/20/2018 1901  Gross per 24 hour   Intake 610 ml   Output 800 ml   Net -190 ml       General: In NAD.  Cardiovascular: RRR.  Pulmonary: Lungs clear, no wheezes.  Effort nonlabored.  GI: Abdomen soft.  Extremities: Warm, no edema.  Neuro: Awake and alert, motor nonfocal.    Labs:    Recent Results (from the past 24 hour(s))   METABOLIC PANEL, BASIC    Collection Time: 05/20/18 12:48 PM   Result Value Ref Range    Sodium 141 136 - 145 mmol/L    Potassium 3.4 (L) 3.5 - 5.5 mmol/L    Chloride 103 100 - 111 mmol/L    CO2 31 21 - 32 mmol/L    Anion gap 7 3.0 - 18 mmol/L    Glucose 97 74 - 99 mg/dL    BUN 3 (L) 7.0 - 18 MG/DL    Creatinine 8.67 (L) 0.6 - 1.3 MG/DL    BUN/Creatinine ratio 5  (L) 12 - 20      GFR est AA >60 >60 ml/min/1.75m2    GFR est non-AA >60 >60 ml/min/1.47m2    Calcium 9.0 8.5 - 10.1 MG/DL   PHOSPHORUS    Collection Time: 05/20/18 12:48 PM   Result Value Ref Range    Phosphorus 2.7 2.5 - 4.9 MG/DL   CBC WITH AUTOMATED DIFF    Collection Time: 05/21/18  1:57 AM   Result Value Ref Range    WBC 4.5 (L) 4.6 - 13.2 K/uL    RBC 3.75 (L) 4.20 - 5.30 M/uL    HGB 10.3 (L) 12.0 - 16.0 g/dL    HCT 67.2 (L) 09.4 - 45.0 %    MCV 86.9 74.0 - 97.0 FL    MCH 27.5 24.0 - 34.0 PG    MCHC 31.6 31.0 - 37.0 g/dL    RDW 70.9 (H) 62.8 - 14.5 %    PLATELET 101 (L) 135 - 420 K/uL    NEUTROPHILS 53 40 -  73 %    LYMPHOCYTES 35 21 - 52 %    MONOCYTES 7 3 - 10 %    EOSINOPHILS 4 0 - 5 %    BASOPHILS 1 0 - 2 %    ABS. NEUTROPHILS 2.4 1.8 - 8.0 K/UL    ABS. LYMPHOCYTES 1.6 0.9 - 3.6 K/UL    ABS. MONOCYTES 0.3 0.05 - 1.2 K/UL    ABS. EOSINOPHILS 0.2 0.0 - 0.4 K/UL    ABS. BASOPHILS 0.0 0.0 - 0.1 K/UL    DF AUTOMATED     MAGNESIUM    Collection Time: 05/21/18  1:57 AM   Result Value Ref Range    Magnesium 2.0 1.6 - 2.6 mg/dL   PHOSPHORUS    Collection Time: 05/21/18  1:57 AM   Result Value Ref Range    Phosphorus 3.4 2.5 - 4.9 MG/DL   METABOLIC PANEL, BASIC    Collection Time: 05/21/18  1:57 AM   Result Value Ref Range    Sodium 142 136 - 145 mmol/L    Potassium 3.5 3.5 - 5.5 mmol/L    Chloride 105 100 - 111 mmol/L    CO2 32 21 - 32 mmol/L    Anion gap 5 3.0 - 18 mmol/L    Glucose 88 74 - 99 mg/dL    BUN 7 7.0 - 18 MG/DL    Creatinine 8.45 (L) 0.6 - 1.3 MG/DL    BUN/Creatinine ratio 15 12 - 20      GFR est AA >60 >60 ml/min/1.24m2    GFR est non-AA >60 >60 ml/min/1.69m2    Calcium 8.3 (L) 8.5 - 10.1 MG/DL       Signed By: Areatha Keas, MD     May 21, 2018

## 2018-05-22 LAB — CBC WITH AUTO DIFFERENTIAL
Basophils %: 1 % (ref 0–2)
Basophils Absolute: 0 10*3/uL (ref 0.0–0.1)
Eosinophils %: 4 % (ref 0–5)
Eosinophils Absolute: 0.2 10*3/uL (ref 0.0–0.4)
Hematocrit: 32.9 % — ABNORMAL LOW (ref 35.0–45.0)
Hemoglobin: 10.1 g/dL — ABNORMAL LOW (ref 12.0–16.0)
Lymphocytes %: 33 % (ref 21–52)
Lymphocytes Absolute: 1.3 10*3/uL (ref 0.9–3.6)
MCH: 26.6 PG (ref 24.0–34.0)
MCHC: 30.7 g/dL — ABNORMAL LOW (ref 31.0–37.0)
MCV: 86.6 FL (ref 74.0–97.0)
Monocytes %: 13 % — ABNORMAL HIGH (ref 3–10)
Monocytes Absolute: 0.5 10*3/uL (ref 0.05–1.2)
Neutrophils %: 49 % (ref 40–73)
Neutrophils Absolute: 2 10*3/uL (ref 1.8–8.0)
Platelet Comment: DECREASED
Platelets: 115 10*3/uL — ABNORMAL LOW (ref 135–420)
RBC: 3.8 M/uL — ABNORMAL LOW (ref 4.20–5.30)
RDW: 16.3 % — ABNORMAL HIGH (ref 11.6–14.5)
WBC: 4 10*3/uL — ABNORMAL LOW (ref 4.6–13.2)

## 2018-05-22 LAB — BASIC METABOLIC PANEL
Anion Gap: 3 mmol/L (ref 3.0–18)
BUN: 7 MG/DL (ref 7.0–18)
Bun/Cre Ratio: 13 (ref 12–20)
CO2: 31 mmol/L (ref 21–32)
Calcium: 8.3 MG/DL — ABNORMAL LOW (ref 8.5–10.1)
Chloride: 107 mmol/L (ref 100–111)
Creatinine: 0.52 MG/DL — ABNORMAL LOW (ref 0.6–1.3)
EGFR IF NonAfrican American: >60 mL/min/{1.73_m2} (ref >60)
GFR African American: >60 mL/min/{1.73_m2} (ref >60)
Glucose: 88 mg/dL (ref 74–99)
Potassium: 4.3 mmol/L (ref 3.5–5.5)
Sodium: 141 mmol/L (ref 136–145)

## 2018-05-22 LAB — MAGNESIUM
Magnesium: 1.7 mg/dL (ref 1.6–2.6)
Magnesium: 1.7 mg/dL (ref 1.6–2.6)

## 2018-05-22 LAB — PHOSPHORUS
Phosphorus: 3.2 MG/DL (ref 2.5–4.9)
Phosphorus: 3.2 MG/DL (ref 2.5–4.9)

## 2018-05-22 LAB — METABOLIC PANEL, BASIC
Anion gap: 3 mmol/L (ref 3.0–18)
BUN/Creatinine ratio: 13 (ref 12–20)
BUN: 7 MG/DL (ref 7.0–18)
CO2: 31 mmol/L (ref 21–32)
Calcium: 8.3 MG/DL — ABNORMAL LOW (ref 8.5–10.1)
Chloride: 107 mmol/L (ref 100–111)
Creatinine: 0.52 MG/DL — ABNORMAL LOW (ref 0.6–1.3)
GFR est AA: >60 mL/min/{1.73_m2} (ref >60)
GFR est non-AA: >60 mL/min/{1.73_m2} (ref >60)
Glucose: 88 mg/dL (ref 74–99)
Potassium: 4.3 mmol/L (ref 3.5–5.5)
Sodium: 141 mmol/L (ref 136–145)

## 2018-05-22 LAB — CBC WITH AUTOMATED DIFF
ABS. BASOPHILS: 0 10*3/uL (ref 0.0–0.1)
ABS. EOSINOPHILS: 0.2 10*3/uL (ref 0.0–0.4)
ABS. LYMPHOCYTES: 1.3 10*3/uL (ref 0.9–3.6)
ABS. MONOCYTES: 0.5 10*3/uL (ref 0.05–1.2)
ABS. NEUTROPHILS: 2 10*3/uL (ref 1.8–8.0)
BASOPHILS: 1 % (ref 0–2)
EOSINOPHILS: 4 % (ref 0–5)
HCT: 32.9 % — ABNORMAL LOW (ref 35.0–45.0)
HGB: 10.1 g/dL — ABNORMAL LOW (ref 12.0–16.0)
LYMPHOCYTES: 33 % (ref 21–52)
MCH: 26.6 PG (ref 24.0–34.0)
MCHC: 30.7 g/dL — ABNORMAL LOW (ref 31.0–37.0)
MCV: 86.6 FL (ref 74.0–97.0)
MONOCYTES: 13 % — ABNORMAL HIGH (ref 3–10)
NEUTROPHILS: 49 % (ref 40–73)
PLATELET COMMENTS: DECREASED
PLATELET: 115 10*3/uL — ABNORMAL LOW (ref 135–420)
RBC: 3.8 M/uL — ABNORMAL LOW (ref 4.20–5.30)
RDW: 16.3 % — ABNORMAL HIGH (ref 11.6–14.5)
WBC: 4 10*3/uL — ABNORMAL LOW (ref 4.6–13.2)

## 2018-05-22 MED ORDER — CALCIUM CITRATE 200 MG (950 MG) TAB
200 mg (950 mg) | ORAL_TABLET | Freq: Every day | ORAL | 0 refills | Status: DC
Start: 2018-05-22 — End: 2018-05-22

## 2018-05-22 MED ORDER — POTASSIUM BICARBONATE-CITRIC ACID 20 MEQ EFFERVESCENT TABLET
20 mEq | ORAL_TABLET | Freq: Every day | ORAL | 1 refills | Status: DC
Start: 2018-05-22 — End: 2018-11-07

## 2018-05-22 MED ORDER — POTASSIUM BICARBONATE-CITRIC ACID 20 MEQ EFFERVESCENT TABLET
20 mEq | ORAL_TABLET | Freq: Every day | ORAL | 1 refills | Status: DC
Start: 2018-05-22 — End: 2018-05-22

## 2018-05-22 MED ORDER — MAGNESIUM OXIDE 400 MG TAB
400 mg | ORAL_TABLET | Freq: Every day | ORAL | 0 refills | Status: DC
Start: 2018-05-22 — End: 2018-05-22

## 2018-05-22 MED ORDER — CALCIUM CITRATE 200 MG (950 MG) TAB
200 mg (950 mg) | ORAL_TABLET | Freq: Every day | ORAL | 0 refills | Status: AC
Start: 2018-05-22 — End: ?

## 2018-05-22 MED ORDER — MAGNESIUM OXIDE 400 MG TAB
400 mg | ORAL_TABLET | Freq: Every day | ORAL | 0 refills | Status: DC
Start: 2018-05-22 — End: 2018-11-07

## 2018-05-22 MED FILL — PANTOPRAZOLE 40 MG TAB, DELAYED RELEASE: 40 mg | ORAL | Qty: 1

## 2018-05-22 MED FILL — THERAPEUTIC MULTIVITAMIN TAB: ORAL | Qty: 1

## 2018-05-22 MED FILL — SODIUM CHLORIDE 0.45 % IV: 0.45 % | INTRAVENOUS | Qty: 1000

## 2018-05-22 MED FILL — CHOLECALCIFEROL (VITAMIN D3) 5,000 UNIT CAP: ORAL | Qty: 1

## 2018-05-22 MED FILL — THIAMINE HCL 100 MG TAB: 100 mg | ORAL | Qty: 1

## 2018-05-22 MED FILL — CALCITRATE 200 MG (950 MG) TABLET: 200 mg (950 mg) | ORAL | Qty: 1

## 2018-05-22 MED FILL — CYANOCOBALAMIN 1,000 MCG TAB: 1000 mcg | ORAL | Qty: 1

## 2018-05-22 MED FILL — MAGNESIUM OXIDE 400 MG TAB: 400 mg | ORAL | Qty: 1

## 2018-05-22 MED FILL — EFFER-K 20 MEQ EFFERVESCENT TABLET: 20 mEq | ORAL | Qty: 2

## 2018-05-22 MED FILL — LISINOPRIL 40 MG TAB: 40 mg | ORAL | Qty: 1

## 2018-05-22 NOTE — Discharge Summary (Signed)
Discharge Summary    Patient: Cheryl Paul MRN: [id number]  CSN: [id number]    Date of Birth: 10-Mar-1951  Age: 68 y.o.  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       General: Alert, cooperative, no acute distress????  HEENT: NC, Atraumatic.  PERRLA, EOMI. Anicteric sclerae.  Lungs:  CTA Bilaterally. No Wheezing/Rhonchi/Rales.  Heart:  Regular  rhythm,?? No murmur, No Rubs, No Gallops  Abdomen: Soft, Non distended, Non tender. ??+Bowel sounds, no HSM  Extremities: No c/c/e  Psych:???? Good insight.??Not anxious or agitated.  Neurologic:?? Awake and alert.  Moves extremities spontaneously.    Hospital Course:   See admission H&P for full details of HPI.  Patient was admitted to the hospital for evaluation of a syncopal event as well as multiple electrolyte abnormalities.  Syncope work-up was essentially negative and this event is likely vasovagal in nature.  Electrolyte abnormalities have been replaced and patient will be discharged home with continue supplementation.  Clinical nutrition evaluation was obtained this hospitalization to assist patient with post bypass diet recommendations and healthy choices.  She is continued to improve and feels well overall.  Patient is medically stable for discharge home with outpatient follow-up as advised.        Consults:   None       Significant Diagnostic Studies:   CT head:  IMPRESSION:  ??  1. No CT evidence of acute intracranial abnormality.  2. Moderate burden of periventricular and subcortical white matter presumed  chronic small vessel disease.  ??        Lower extremity venous PVL:  Interpretation Summary     No evidence of DVT within bilateral lower extremities   Lower Extremity Venous Findings     Right Lower Venous     No evidence of deep vein thrombosis in the common femoral, profunda femoral, superficial femoral, popliteal, posterior tibial, and peroneal veins. The veins were imaged in the transverse and longitudinal planes. The vessels showed normal color filling and compressibility. Doppler interrogation showed phasic and spontaneous flow.     The right posterior tibial artery has biphasic waveforms.   Left Lower Venous     No evidence of deep vein thrombosis in the common femoral, profunda femoral, superficial femoral, popliteal, posterior tibial, and peroneal veins. The veins were imaged in the transverse and longitudinal planes. The vessels showed normal color filling and compressibility. Doppler interrogation showed phasic and spontaneous flow.       The left posterior tibial artery has biphasic waveforms.               2D echo:  Interpretation Summary     Result status: Final result   ?? Normal cavity size,  wall thickness and systolic function (ejection fraction normal). Estimated left ventricular ejection fraction is 55 - 60%. Visually measured ejection fraction. No regional wall motion abnormality noted. Inconclusive left ventricular diastolic function.  ?? Severely dilated left atrium.      Comparison Study Information     Prior Study     There is no prior study available for comparison           Discharge Medications:     Discharge Medication List as of 05/22/2018  1:25 PM      CONTINUE these medications which have CHANGED    Details   calcium citrate 200 mg (950 mg) tablet Take 1 Tab by mouth daily.  Indications: malabsorption, Normal, Disp-30 Tab, R-0      magnesium oxide (MAG-OX) 400 mg tablet Take 1 Tab by mouth daily., Normal, Disp-30 Tab, R-0      potassium bicarb-citric acid (EFFER-K) 20 mEq tablet Take 2 Tabs by mouth daily., Normal, Disp-30 Tab, R-1         CONTINUE these medications which have NOT CHANGED    Details   multivitamin (ONE A DAY) tablet Take 1 Tab by mouth daily., Historical Med      thiamine HCL (B-1) 100 mg tablet Take  by mouth daily., Historical Med      cyanocobalamin 1,000 mcg tablet Take 1,000 mcg by mouth daily., Historical Med      hyoscyamine sulfate (CYSTOSPAZ) 0.125 mg tablet 1 Tab by SubLINGual route every six (6) hours as needed for Other (Spasms)., Print, Disp-17 Tab, R-0      lisinopril (PRINIVIL, ZESTRIL) 40 mg tablet Take 1 Tab by mouth daily., Print, Disp-30 Tab, R-0      pantoprazole (PROTONIX) 40 mg tablet Take 1 Tab by mouth Daily (before breakfast)., Print, Disp-30 Tab, R-0      ondansetron (ZOFRAN ODT) 4 mg disintegrating tablet Take 1 Tab by mouth every eight (8) hours as needed for Nausea., Print, Disp-15 Tab, R-0      cholecalciferol, VITAMIN D3, (VITAMIN D3) 5,000 unit tab tablet Take 5,000 Units by mouth daily., Historical Med      albuterol (PROVENTIL HFA, VENTOLIN HFA, PROAIR HFA) 90 mcg/actuation inhaler Take 1-2 Puffs by inhalation every four (4) hours as needed for Wheezing., Print, Disp-1 Inhaler, R-0      nystatin (MYCOSTATIN) topical cream Apply  to affected area two (2) times a day., Normal, Disp-15 g, R-1      alcohol swabs padm Use daily to test blood sugar  DX E11.9, Normal, Disp-100 Pad, R-1         STOP taking these medications       aspirin 81 mg chewable tablet Comments:   Reason for Stopping:         calcium carbonate (CALCIUM 300 PO) Comments:   Reason for Stopping:         atorvastatin (LIPITOR) 20 mg tablet Comments:   Reason for Stopping:                 Activity: As tolerated    Diet: Post gastric bypass diet has recommended.     Disposition: Home    Follow-up: with PCP, Nonie Hoyer, MD in 1 week.    Minutes spent on discharge: >30 minutes spent coordinating this discharge.      Melvern Sample. Lindie Spruce, MD

## 2018-05-22 NOTE — Progress Notes (Signed)
Discharge noted for today, patient will return home, family to transport, no needs identified.       Kaylee Watson, MSW  Case Management  [phone]

## 2018-05-22 NOTE — Progress Notes (Signed)
Pt discharged safely to daughter in stable condition.

## 2018-05-22 NOTE — Progress Notes (Signed)
Pt discharge instructions and prescriptions gone over with pt. Pt verbalized understanding and had no further questions. Pt IV discontinued without issue. Pt waiting for daughter to arrive to transport home.

## 2018-05-22 NOTE — Discharge Summary (Signed)
Discharge Summary by Areatha Keas, MD at 05/22/18 1436                Author: Areatha Keas, MD  Service: FAMILY MEDICINE  Author Type: Physician       Filed: 05/30/18 [id number]  Date of Service: 05/22/18 1436  Status: Signed          Editor: Areatha Keas, MD (Physician)                    Discharge Summary          Patient: Cheryl Paul  MRN: [id number]   CSN: [id number]          Date of Birth: 1951-01-27   Age: 68 y.o.   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           General: Alert, cooperative, no acute distress????   HEENT: NC, Atraumatic.  PERRLA, EOMI. Anicteric sclerae.   Lungs:  CTA Bilaterally. No Wheezing/Rhonchi/Rales.   Heart:  Regular  rhythm,?? No murmur, No Rubs, No Gallops   Abdomen: Soft, Non distended, Non tender. ??+Bowel sounds, no HSM   Extremities: No c/c/e   Psych:???? Good insight.??Not anxious or agitated.   Neurologic:?? Awake and alert.  Moves extremities spontaneously.      Hospital Course:    See admission H&P for full details of HPI.   Patient was admitted to the hospital for evaluation of a syncopal event as well as multiple electrolyte abnormalities.   Syncope work-up was essentially negative and this event is likely vasovagal in nature.   Electrolyte abnormalities have  been replaced and patient will be discharged home with continue supplementation.   Clinical nutrition evaluation was obtained this hospitalization to assist patient with post bypass diet recommendations and healthy choices.   She is continued to improve and feels well overall.   Patient is medically stable for discharge home with outpatient follow-up as advised.            Consults:    None         Significant Diagnostic Studies:    CT head:   IMPRESSION:   ??   1. No CT evidence of acute intracranial abnormality.   2. Moderate burden of periventricular and subcortical white matter presumed   chronic small vessel disease.   ??  Lower extremity venous PVL:   Interpretation Summary         No evidence of DVT within bilateral lower extremities     Lower Extremity Venous Findings       Right Lower Venous         No evidence of deep vein thrombosis in the common femoral, profunda femoral, superficial femoral, popliteal, posterior tibial, and peroneal veins. The  veins were imaged in the transverse and longitudinal planes. The vessels showed normal color filling and compressibility. Doppler interrogation showed phasic and spontaneous flow.     The right posterior tibial artery has biphasic waveforms.     Left Lower Venous         No evidence of deep vein thrombosis in the common femoral, profunda femoral, superficial femoral, popliteal, posterior tibial, and peroneal veins. The  veins were imaged in the transverse and longitudinal planes. The vessels showed normal color filling and compressibility. Doppler interrogation showed phasic and spontaneous flow.       The left posterior tibial artery has biphasic waveforms.                       2D echo:   Interpretation Summary         Result status: Final result     ??  Normal cavity size, wall thickness and systolic function (ejection fraction normal). Estimated left ventricular ejection fraction  is 55 - 60%. Visually measured ejection fraction. No regional wall  motion abnormality noted. Inconclusive left ventricular diastolic function.   ??  Severely dilated left atrium.         Comparison Study Information       Prior Study         There is no prior study available for comparison                 Discharge Medications:        Discharge Medication List as of 05/22/2018  1:25 PM              CONTINUE these medications which have CHANGED          Details        calcium citrate 200 mg (950 mg) tablet  Take 1 Tab by mouth daily. Indications: malabsorption, Normal, Disp-30 Tab, R-0               magnesium oxide (MAG-OX) 400 mg tablet  Take 1 Tab by mouth daily., Normal, Disp-30 Tab, R-0               potassium bicarb-citric acid (EFFER-K) 20 mEq tablet  Take 2 Tabs by mouth daily., Normal, Disp-30 Tab, R-1                     CONTINUE these medications which have NOT CHANGED          Details        multivitamin (ONE A DAY) tablet  Take 1 Tab by mouth daily., Historical Med               thiamine HCL (B-1) 100 mg tablet  Take  by mouth daily., Historical Med               cyanocobalamin 1,000 mcg tablet  Take 1,000 mcg by mouth daily., Historical Med               hyoscyamine sulfate (CYSTOSPAZ) 0.125 mg tablet  1 Tab by SubLINGual route  every six (6) hours as needed for Other (Spasms)., Print, Disp-17 Tab, R-0               lisinopril (PRINIVIL, ZESTRIL) 40 mg tablet  Take 1 Tab by mouth daily., Print, Disp-30 Tab, R-0               pantoprazole (PROTONIX) 40 mg tablet  Take 1 Tab by mouth Daily (before breakfast)., Print, Disp-30 Tab, R-0               ondansetron (ZOFRAN ODT) 4 mg disintegrating tablet  Take 1 Tab by mouth every eight (8) hours as needed for Nausea., Print, Disp-15 Tab, R-0               cholecalciferol, VITAMIN D3, (VITAMIN D3) 5,000 unit tab tablet  Take 5,000 Units by mouth daily., Historical Med               albuterol (PROVENTIL HFA, VENTOLIN HFA, PROAIR HFA) 90 mcg/actuation inhaler  Take 1-2 Puffs by inhalation every four (4) hours as needed for Wheezing.,  Print, Disp-1 Inhaler, R-0               nystatin (MYCOSTATIN) topical cream  Apply  to affected area two (2) times a day., Normal, Disp-15 g, R-1               alcohol swabs padm  Use daily to test blood sugar  DX E11.9, Normal, Disp-100 Pad, R-1                     STOP taking these medications                  aspirin 81 mg chewable tablet  Comments:    Reason for Stopping:                      calcium carbonate (CALCIUM 300 PO)  Comments:    Reason for Stopping:                      atorvastatin (LIPITOR) 20 mg tablet  Comments:    Reason for Stopping:                                Activity: As tolerated      Diet: Post gastric bypass diet has recommended.      Disposition: Home      Follow-up: with PCP, Nonie HoyerParagas, [place], MD  in 1 week.      Minutes spent on discharge: >30 minutes spent coordinating this discharge.         Melvern SampleArmando J. Lindie SpruceWyatt, MD

## 2018-05-22 NOTE — Progress Notes (Signed)
Pt discharged safely to daughter in stable condition.

## 2018-05-22 NOTE — Progress Notes (Signed)
Pt discharge instructions and prescriptions gone over with pt. Pt verbalized understanding and had no further questions. Pt IV discontinued without issue. Pt waiting for daughter to arrive to transport home.

## 2018-05-22 NOTE — Progress Notes (Signed)
Discharge noted for today, patient will return home, family to transport, no needs identified.       Ozella Rocks, MSW  Case Management  [phone]

## 2018-05-27 ENCOUNTER — Encounter

## 2018-05-31 ENCOUNTER — Encounter

## 2018-06-03 ENCOUNTER — Encounter: Attending: Surgery | Primary: Family Medicine

## 2018-06-03 ENCOUNTER — Ambulatory Visit: Payer: MEDICARE | Primary: Family Medicine

## 2018-06-04 ENCOUNTER — Encounter

## 2018-06-04 ENCOUNTER — Inpatient Hospital Stay: Admit: 2018-06-04 | Payer: MEDICARE | Primary: Family Medicine

## 2018-06-04 DIAGNOSIS — I10 Essential (primary) hypertension: Secondary | ICD-10-CM

## 2018-06-04 DIAGNOSIS — K912 Postsurgical malabsorption, not elsewhere classified: Secondary | ICD-10-CM

## 2018-06-04 LAB — COMPREHENSIVE METABOLIC PANEL
ALT: 38 U/L (ref 13–56)
AST: 25 U/L (ref 10–38)
Albumin/Globulin Ratio: 1.2 (ref 0.8–1.7)
Albumin: 3.4 g/dL (ref 3.4–5.0)
Alkaline Phosphatase: 50 U/L (ref 45–117)
Anion Gap: 4 mmol/L (ref 3.0–18)
BUN: 10 MG/DL (ref 7.0–18)
Bun/Cre Ratio: 15 (ref 12–20)
CO2: 31 mmol/L (ref 21–32)
Calcium: 9.6 MG/DL (ref 8.5–10.1)
Chloride: 111 mmol/L (ref 100–111)
Creatinine: 0.67 MG/DL (ref 0.6–1.3)
EGFR IF NonAfrican American: >60 mL/min/{1.73_m2} (ref >60)
GFR African American: >60 mL/min/{1.73_m2} (ref >60)
Globulin: 2.8 g/dL (ref 2.0–4.0)
Glucose: 87 mg/dL (ref 74–99)
Potassium: 4.1 mmol/L (ref 3.5–5.5)
Sodium: 146 mmol/L — ABNORMAL HIGH (ref 136–145)
Total Bilirubin: 0.5 MG/DL (ref 0.2–1.0)
Total Protein: 6.2 g/dL — ABNORMAL LOW (ref 6.4–8.2)

## 2018-06-04 LAB — IRON
Iron: 41 ug/dL — ABNORMAL LOW (ref 50–175)
Iron: 41 ug/dL — ABNORMAL LOW (ref 50–175)

## 2018-06-04 LAB — LIPID PANEL
CHOL/HDL Ratio: 2.2 (ref 0–5.0)
Chol/HDL Ratio: 2.2 (ref 0–5.0)
Cholesterol, Total: 210 MG/DL — ABNORMAL HIGH (ref <200)
Cholesterol, total: 210 MG/DL — ABNORMAL HIGH (ref <200)
HDL Cholesterol: 97 MG/DL — ABNORMAL HIGH (ref 40–60)
HDL: 97 MG/DL — ABNORMAL HIGH (ref 40–60)
LDL Calculated: 88.4 MG/DL (ref 0–100)
LDL, calculated: 88.4 MG/DL (ref 0–100)
Triglyceride: 123 MG/DL (ref <150)
Triglycerides: 123 MG/DL (ref <150)
VLDL Cholesterol Calculated: 24.6 MG/DL
VLDL, calculated: 24.6 MG/DL

## 2018-06-04 LAB — MAGNESIUM
Magnesium: 1.7 mg/dL (ref 1.6–2.6)
Magnesium: 1.7 mg/dL (ref 1.6–2.6)

## 2018-06-04 LAB — VITAMIN B12 & FOLATE
Folate: 10.3 ng/mL (ref 3.10–17.50)
Folate: 10.3 ng/mL (ref 3.10–17.50)
Vitamin B-12: 1170 pg/mL — ABNORMAL HIGH (ref 211–911)
Vitamin B12: 1170 pg/mL — ABNORMAL HIGH (ref 211–911)

## 2018-06-04 LAB — HEMOGLOBIN A1C W/O EAG
Hemoglobin A1C: 5.4 % (ref 4.2–5.6)
Hemoglobin A1c: 5.4 % (ref 4.2–5.6)

## 2018-06-04 LAB — VITAMIN D 25 HYDROXY: Vit D, 25-Hydroxy: 36.6 ng/mL (ref 30–100)

## 2018-06-04 LAB — METABOLIC PANEL, COMPREHENSIVE
A-G Ratio: 1.2 (ref 0.8–1.7)
ALT (SGPT): 38 U/L (ref 13–56)
AST (SGOT): 25 U/L (ref 10–38)
Albumin: 3.4 g/dL (ref 3.4–5.0)
Alk. phosphatase: 50 U/L (ref 45–117)
Anion gap: 4 mmol/L (ref 3.0–18)
BUN/Creatinine ratio: 15 (ref 12–20)
BUN: 10 MG/DL (ref 7.0–18)
Bilirubin, total: 0.5 MG/DL (ref 0.2–1.0)
CO2: 31 mmol/L (ref 21–32)
Calcium: 9.6 MG/DL (ref 8.5–10.1)
Chloride: 111 mmol/L (ref 100–111)
Creatinine: 0.67 MG/DL (ref 0.6–1.3)
GFR est AA: >60 mL/min/{1.73_m2} (ref >60)
GFR est non-AA: >60 mL/min/{1.73_m2} (ref >60)
Globulin: 2.8 g/dL (ref 2.0–4.0)
Glucose: 87 mg/dL (ref 74–99)
Potassium: 4.1 mmol/L (ref 3.5–5.5)
Protein, total: 6.2 g/dL — ABNORMAL LOW (ref 6.4–8.2)
Sodium: 146 mmol/L — ABNORMAL HIGH (ref 136–145)

## 2018-06-04 LAB — VITAMIN D, 25 HYDROXY: Vitamin D 25-Hydroxy: 36.6 ng/mL (ref 30–100)

## 2018-06-06 ENCOUNTER — Ambulatory Visit: Payer: MEDICARE | Primary: Family Medicine

## 2018-06-07 LAB — VITAMIN B1, WHOLE BLOOD
Vitamin B1,Whole Blood: 147.3 nmol/L (ref 66.5–200.0)
Vitamin B1: 147.3 nmol/L (ref 66.5–200.0)

## 2018-06-08 ENCOUNTER — Ambulatory Visit: Attending: Surgery | Primary: Family Medicine

## 2018-06-08 ENCOUNTER — Ambulatory Visit: Admit: 2018-06-08 | Discharge: 2018-06-08 | Payer: MEDICARE | Attending: Surgery | Primary: Family Medicine

## 2018-06-08 DIAGNOSIS — K912 Postsurgical malabsorption, not elsewhere classified: Secondary | ICD-10-CM

## 2018-06-08 NOTE — Progress Notes (Signed)
06/08/18:  Patient was left a voicemail asking to call me to set up a nutrition follow up, per Dr. Spencer.    Jacqueline Browning, MS RD

## 2018-06-08 NOTE — Progress Notes (Signed)
Subjective:      Cheryl Paul is a 68 y.o. black female who presents in follow-up status post laparoscopic gastric bypass February 21, 2018 noting no specific complaint referable to her bariatric surgery.  Tolerant of an compliant with a regular bariatric diet with no specific food intolerances or pathologic emesis with appropriate early satiety.  Patient has not attempted density carbohydrates and therefore has not noted dumping syndrome.  Compliant with multivitamin, calcium citrate, vitamin B1, vitamin B12, vitamin D supplementation.  Exercise regimen: No organized regular exercise regimen initiated.  Comorbidities: Adult onset diabetes mellitus, reactive airway disease, stricture incontinence???resolved.                          Hypertension, with related arthropathy???improved.  Preoperative weight: 273.  Current weight: 221.  BMI: 39.  Estimated weight loss: 116.  Current weight loss: 52.  Goal weight: 160.  Percentage weight loss: 44% / 3 months        Weight Loss Metrics 06/08/2018 06/08/2018 05/22/2018 03/09/2018 03/09/2018 03/02/2018 02/21/2018   Pre op / Initial Wt 273 - - 273 - - -   Today's Wt - 221 lb 241 lb 6.4 oz - 258 lb 269 lb -   BMI - 39.15 kg/m2 42.76 kg/m2 - 45.7 kg/m2 47.65 kg/m2 48.36 kg/m2   Ideal Body Wt 128 - - 131 - - -   Excess Body Wt 145 - - 142 - - -   Goal Wt 157 - - 160 - - -   Wt loss to date 52 - - 15 - - -   % Wt Loss 0.45 - - 0.13 - - -   80% EBW 116 - - 113.6 - - -               Past Medical History:   Diagnosis Date   ??? Arthritis     knee, arms,    ??? Asthma    ??? Chronic obstructive pulmonary disease (HCC)    ??? Chronic pain    ??? Diabetes (HCC)    ??? H/O sinusitis    ??? Hypercholesterolemia    ??? Hypertension    ??? Sleep apnea     NOT USING    ??? Stress incontinence    ??? Toe fracture, left August 2015    4th toe       Past Surgical History:   Procedure Laterality Date   ??? ABDOMEN SURGERY PROC UNLISTED      4 surguries for blockages   ??? HX CARPAL TUNNEL RELEASE Bilateral     ??? HX CHOLECYSTECTOMY     ??? HX GYN     ??? HX HERNIA REPAIR      abdomen   ??? HX HYSTERECTOMY      tubes tied   ??? 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 HX ORTHOPAEDIC      catrpal tunnel right and left   ??? HX POLYPECTOMY  04/18/14       Current Outpatient Medications   Medication Sig Dispense Refill   ??? calcium citrate 200 mg (950 mg) tablet Take 1 Tab by mouth daily. Indications: malabsorption 30 Tab 0   ??? magnesium oxide (MAG-OX) 400 mg tablet Take 1 Tab by mouth daily. 30 Tab 0   ??? potassium bicarb-citric acid (EFFER-K) 20 mEq tablet Take 2 Tabs by mouth daily. 30 Tab 1   ??? multivitamin (ONE A DAY) tablet Take 1 Tab by  mouth daily.     ??? thiamine HCL (B-1) 100 mg tablet Take  by mouth daily.     ??? cyanocobalamin 1,000 mcg tablet Take 1,000 mcg by mouth daily.     ??? hyoscyamine sulfate (CYSTOSPAZ) 0.125 mg tablet 1 Tab by SubLINGual route every six (6) hours as needed for Other (Spasms). 17 Tab 0   ??? lisinopril (PRINIVIL, ZESTRIL) 40 mg tablet Take 1 Tab by mouth daily. 30 Tab 0   ??? pantoprazole (PROTONIX) 40 mg tablet Take 1 Tab by mouth Daily (before breakfast). 30 Tab 0   ??? cholecalciferol, VITAMIN D3, (VITAMIN D3) 5,000 unit tab tablet Take 5,000 Units by mouth daily.     ??? ondansetron (ZOFRAN ODT) 4 mg disintegrating tablet Take 1 Tab by mouth every eight (8) hours as needed for Nausea. 15 Tab 0   ??? albuterol (PROVENTIL HFA, VENTOLIN HFA, PROAIR HFA) 90 mcg/actuation inhaler Take 1-2 Puffs by inhalation every four (4) hours as needed for Wheezing. 1 Inhaler 0   ??? nystatin (MYCOSTATIN) topical cream Apply  to affected area two (2) times a day. 15 g 1   ??? alcohol swabs padm Use daily to test blood sugar  DX E11.9 100 Pad 1       Allergies   Allergen Reactions   ??? Penicillins Itching   ??? Butrans [Buprenorphine] Rash   ??? Codeine Other (comments)     Nausea and abd pain   ??? Iodine Rash   ??? Lactose Diarrhea     Lactose intolerance   ??? Other Plant, Animal, Environmental Hives     Conductive electrodes          Objective:     Visit Vitals  BP 118/65 (BP 1 Location: Left arm, BP Patient Position: Sitting)   Pulse 77   Temp 96.9 ??F (36.1 ??C)   Ht 5\' 3"  (1.6 m)   Wt 100.2 kg (221 lb)   SpO2 96%   BMI 39.15 kg/m??       General:  Alert, oriented x3, nontoxic in appearance   Chest: Clear to auscultation without adventitious sounds with equal and symmetric excursions bilaterally   Cor: Regular rate and rhythm without rub gallop or murmur   Abdomen: Soft with normoactive bowel sounds and no masses, megaly         May 27, 2018 CMP, cholesterol panel, hemoglobin A1c, vitamin B1, vitamin B12, folate, iron, vitamin D                               Iron 49, sodium 146, cholesterol 210 with remainder laboratory parameters within normal limits.  CBC not obtained    Assessment:     History of clinically severe obesity status post laparoscopic gastric bypass February 21, 2018 with appropriate weight loss and comorbidity resolution    Plan:     Continue pursuit of a regular bariatric diet, vitamin supplementation protocol, initiate a regular exercise regimen, encourage monthly attendance at bariatric support group meetings.  Formal bariatric nutrition reevaluation as scheduled  Follow-up April 2020 with labs for 75-month bariatric surgery/nutrition evaluation

## 2018-06-08 NOTE — Progress Notes (Signed)
Cheryl Paul is a 67 y.o. female (DOB: 08/10/1950) presenting to address:    Chief Complaint   Patient presents with   ??? Follow-up     LGBP 02/21/18       Medication list and allergies have been reviewed with Cheryl Paul and updated as of today's date.     I have gone over all Medical, Surgical and Social History with Cheryl Paul and updated/added the information accordingly.       1. Have you been to the ER, Urgent Care or Hospitalized since your last visit? YES, 05/15/18 Dehydration       2. Have you followed up with your PCP or any other Physicians since your procedure/ last office visit?   YES

## 2018-06-08 NOTE — Progress Notes (Signed)
06/08/18:  Patient was left a voicemail asking to call me to set up a nutrition follow up, per Dr. Karleen Hampshire.    Adela Ports, MS RD

## 2018-06-08 NOTE — Progress Notes (Signed)
Subjective:      Cheryl Paul is a 68 y.o. black female who presents in follow-up status post laparoscopic gastric bypass February 21, 2018 noting no specific complaint referable to her bariatric surgery.  Tolerant of an compliant with a regular bariatric diet with no specific food intolerances or pathologic emesis with appropriate early satiety.  Patient has not attempted density carbohydrates and therefore has not noted dumping syndrome.  Compliant with multivitamin, calcium citrate, vitamin B1, vitamin B12, vitamin D supplementation.  Exercise regimen: No organized regular exercise regimen initiated.  Comorbidities: Adult onset diabetes mellitus, reactive airway disease, stricture incontinence???resolved.                          Hypertension, with related arthropathy???improved.  Preoperative weight: 273.  Current weight: 221.  BMI: 39.  Estimated weight loss: 116.  Current weight loss: 52.  Goal weight: 160.  Percentage weight loss: 44% / 3 months        Weight Loss Metrics 06/08/2018 06/08/2018 05/22/2018 03/09/2018 03/09/2018 03/02/2018 02/21/2018   Pre op / Initial Wt 273 - - 273 - - -   Today's Wt - 221 lb 241 lb 6.4 oz - 258 lb 269 lb -   BMI - 39.15 kg/m2 42.76 kg/m2 - 45.7 kg/m2 47.65 kg/m2 48.36 kg/m2   Ideal Body Wt 128 - - 131 - - -   Excess Body Wt 145 - - 142 - - -   Goal Wt 157 - - 160 - - -   Wt loss to date 52 - - 15 - - -   % Wt Loss 0.45 - - 0.13 - - -   80% EBW 116 - - 113.6 - - -               Past Medical History:   Diagnosis Date   ??? Arthritis     knee, arms,    ??? Asthma    ??? Chronic obstructive pulmonary disease (HCC)    ??? Chronic pain    ??? Diabetes (HCC)    ??? H/O sinusitis    ??? Hypercholesterolemia    ??? Hypertension    ??? Sleep apnea     NOT USING    ??? Stress incontinence    ??? Toe fracture, left August 2015    4th toe       Past Surgical History:   Procedure Laterality Date   ??? ABDOMEN SURGERY PROC UNLISTED      4 surguries for blockages   ??? HX CARPAL TUNNEL RELEASE Bilateral    ??? HX  CHOLECYSTECTOMY     ??? HX GYN     ??? HX HERNIA REPAIR      abdomen   ??? HX HYSTERECTOMY      tubes tied   ??? 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 HX ORTHOPAEDIC      catrpal tunnel right and left   ??? HX POLYPECTOMY  04/18/14       Current Outpatient Medications   Medication Sig Dispense Refill   ??? calcium citrate 200 mg (950 mg) tablet Take 1 Tab by mouth daily. Indications: malabsorption 30 Tab 0   ??? magnesium oxide (MAG-OX) 400 mg tablet Take 1 Tab by mouth daily. 30 Tab 0   ??? potassium bicarb-citric acid (EFFER-K) 20 mEq tablet Take 2 Tabs by mouth daily. 30 Tab 1   ??? multivitamin (ONE A DAY) tablet Take 1 Tab by  mouth daily.     ??? thiamine HCL (B-1) 100 mg tablet Take  by mouth daily.     ??? cyanocobalamin 1,000 mcg tablet Take 1,000 mcg by mouth daily.     ??? hyoscyamine sulfate (CYSTOSPAZ) 0.125 mg tablet 1 Tab by SubLINGual route every six (6) hours as needed for Other (Spasms). 17 Tab 0   ??? lisinopril (PRINIVIL, ZESTRIL) 40 mg tablet Take 1 Tab by mouth daily. 30 Tab 0   ??? pantoprazole (PROTONIX) 40 mg tablet Take 1 Tab by mouth Daily (before breakfast). 30 Tab 0   ??? cholecalciferol, VITAMIN D3, (VITAMIN D3) 5,000 unit tab tablet Take 5,000 Units by mouth daily.     ??? ondansetron (ZOFRAN ODT) 4 mg disintegrating tablet Take 1 Tab by mouth every eight (8) hours as needed for Nausea. 15 Tab 0   ??? albuterol (PROVENTIL HFA, VENTOLIN HFA, PROAIR HFA) 90 mcg/actuation inhaler Take 1-2 Puffs by inhalation every four (4) hours as needed for Wheezing. 1 Inhaler 0   ??? nystatin (MYCOSTATIN) topical cream Apply  to affected area two (2) times a day. 15 g 1   ??? alcohol swabs padm Use daily to test blood sugar  DX E11.9 100 Pad 1       Allergies   Allergen Reactions   ??? Penicillins Itching   ??? Butrans [Buprenorphine] Rash   ??? Codeine Other (comments)     Nausea and abd pain   ??? Iodine Rash   ??? Lactose Diarrhea     Lactose intolerance   ??? Other Plant, Animal, Environmental Hives     Conductive electrodes         Objective:      Visit Vitals  BP 118/65 (BP 1 Location: Left arm, BP Patient Position: Sitting)   Pulse 77   Temp 96.9 ??F (36.1 ??C)   Ht 5\' 3"  (1.6 m)   Wt 100.2 kg (221 lb)   SpO2 96%   BMI 39.15 kg/m??       General:  Alert, oriented x3, nontoxic in appearance   Chest: Clear to auscultation without adventitious sounds with equal and symmetric excursions bilaterally   Cor: Regular rate and rhythm without rub gallop or murmur   Abdomen: Soft with normoactive bowel sounds and no masses, megaly         May 27, 2018 CMP, cholesterol panel, hemoglobin A1c, vitamin B1, vitamin B12, folate, iron, vitamin D                               Iron 49, sodium 146, cholesterol 210 with remainder laboratory parameters within normal limits.  CBC not obtained    Assessment:     History of clinically severe obesity status post laparoscopic gastric bypass February 21, 2018 with appropriate weight loss and comorbidity resolution    Plan:     Continue pursuit of a regular bariatric diet, vitamin supplementation protocol, initiate a regular exercise regimen, encourage monthly attendance at bariatric support group meetings.  Formal bariatric nutrition reevaluation as scheduled  Follow-up April 2020 with labs for 36-month bariatric surgery/nutrition evaluation

## 2018-06-08 NOTE — Progress Notes (Signed)
 Cheryl Paul is a 68 y.o. female (DOB: Apr 02, 1951) presenting to address:    Chief Complaint   Patient presents with   . Follow-up     LGBP 02/21/18       Medication list and allergies have been reviewed with Cheryl Paul and updated as of today's date.     I have gone over all Medical, Surgical and Social History with Cheryl Paul and updated/added the information accordingly.       1. Have you been to the ER, Urgent Care or Hospitalized since your last visit? YES, 05/15/18 Dehydration       2. Have you followed up with your PCP or any other Physicians since your procedure/ last office visit?   YES

## 2018-06-09 ENCOUNTER — Inpatient Hospital Stay: Admit: 2018-06-09 | Payer: MEDICARE | Primary: Family Medicine

## 2018-06-09 ENCOUNTER — Encounter

## 2018-06-09 DIAGNOSIS — Z1231 Encounter for screening mammogram for malignant neoplasm of breast: Secondary | ICD-10-CM

## 2018-06-29 ENCOUNTER — Inpatient Hospital Stay: Admit: 2018-06-29 | Primary: Family Medicine

## 2018-06-29 NOTE — Progress Notes (Signed)
[facility]  [address] [facility] [address], [address]    Nutrition Follow up Progress Note      Patient's Name: Cheryl Paul   Age: 68 y.o.  Date of Birth: 1950/11/06   Sex: female    Date:   06/29/2018    Surgery Date: 02/21/18    Height: 5 f 3   Starting Weight: 273  Current Weight:    223          Overall Pounds Lost: 40  Last Recorded Weight: 03/2018: 240  BMI: 39.5     Procedure: Gastric Bypass    Last Nutrition Concerns:  Patient was hospitalized from 05/15/18-05/22/18 in [facility].  She states all her electrolytes were completely depleted.  She states they couldn't get her levels up, which led to the longer hospital stay.  She states for a while, she felt very ill.  She couldn't keep food down and was feeling ill.  She states she was feeling faint and dizzy way before then.     Chief Compliant:  Follow up to hospital stay    Diet History: Breakfast:  Patient states she is eating a little bit of oatmeal, maybe some scrambled egg, maybe a [place] link sausage, she states 1/4 bite of a bagel.  I talked to her about her choices of oatmeal and bagel.   Lunch:  Piece of baked chicken with salad.  She states she doesn't tolerate salad well.  Dinner:  Patient states she always has some type of vegetables.  She states she sometimes will have cauliflower rice.  Portions are less than a palm-sized.    Patient states she is doing much better with her fluid intake.  She states she is drinking 2-20 ounce bottles and 2-8 ounce cups of hot tea.  I have encouraged her to increase to 3 of the 20 ounce bottles of water.    Exercise Level: Minimal. She states she walks grocery shopping.  She hopes to go back and do water aerobics.     Vitamin Intake: Patient is taking all instructed vitamins.    Goals: Patient and I talked about some of her food choices.  I have made recommendations for protein and discontinuing the bagels.  We also talked about increasing her activity level.     Plan: Follow up prn.      Adela Ports, MS RD

## 2018-06-29 NOTE — Progress Notes (Signed)
[facility]  [address] [facility] [address], [address]    Nutrition Follow up Progress Note      Patient's Name: Cheryl Paul   Age: 68 y.o.  Date of Birth: 1951/03/07   Sex: female    Date:   06/29/2018    Surgery Date: 02/21/18    Height: 5 f 3   Starting Weight: 273  Current Weight:    223          Overall Pounds Lost: 40  Last Recorded Weight: 03/2018: 240  BMI: 39.5     Procedure: Gastric Bypass    Last Nutrition Concerns:  Patient was hospitalized from 05/15/18-05/22/18 in [facility]  She states all her electrolytes were completely depleted.  She states they couldn't get her levels up, which led to the longer hospital stay.  She states for a while, she felt very ill.  She couldn't keep food Paul and was feeling ill.  She states she was feeling faint and dizzy way before then.     Chief Compliant:  Follow up to hospital stay    Diet History: Breakfast:  Patient states she is eating a little bit of oatmeal, maybe some scrambled egg, maybe a [place] link sausage, she states 1/4 bite of a bagel.  I talked to her about her choices of oatmeal and bagel.   Lunch:  Piece of baked chicken with salad.  She states she doesn't tolerate salad well.  Dinner:  Patient states she always has some type of vegetables.  She states she sometimes will have cauliflower rice.  Portions are less than a palm-sized.    Patient states she is doing much better with her fluid intake.  She states she is drinking 2-20 ounce bottles and 2-8 ounce cups of hot tea.  I have encouraged her to increase to 3 of the 20 ounce bottles of water.    Exercise Level: Minimal. She states she walks grocery shopping.  She hopes to go back and do water aerobics.     Vitamin Intake: Patient is taking all instructed vitamins.    Goals: Patient and I talked about some of her food choices.  I have made recommendations for protein and discontinuing the bagels.  We also talked about increasing her activity  level.    Plan: Follow up prn.      Adela Ports, MS RD

## 2018-08-04 ENCOUNTER — Other Ambulatory Visit: Payer: Self-pay | Admitting: Family Medicine

## 2018-08-04 DIAGNOSIS — R634 Abnormal weight loss: Secondary | ICD-10-CM

## 2018-08-04 DIAGNOSIS — R1032 Left lower quadrant pain: Secondary | ICD-10-CM

## 2018-08-08 ENCOUNTER — Other Ambulatory Visit: Payer: Self-pay

## 2018-08-08 ENCOUNTER — Ambulatory Visit
Admission: RE | Admit: 2018-08-08 | Discharge: 2018-08-08 | Disposition: A | Discharge status: Home / Self Care | Payer: Medicare HMO | Source: Ambulatory Visit | Attending: Family Medicine | Admitting: Family Medicine

## 2018-08-08 DIAGNOSIS — R634 Abnormal weight loss: Secondary | ICD-10-CM | POA: Diagnosis present

## 2018-08-08 DIAGNOSIS — R1032 Left lower quadrant pain: Secondary | ICD-10-CM

## 2018-08-08 MED ORDER — IOHEXOL 300 MG/ML  SOLN
80.0000 mL | Freq: Once | INTRAMUSCULAR | Status: AC | PRN
  Administered 2018-08-08: 80 mL via INTRAVENOUS

## 2018-08-26 ENCOUNTER — Encounter: Attending: Family Medicine | Primary: Family Medicine

## 2018-09-02 ENCOUNTER — Other Ambulatory Visit
Admission: RE | Admit: 2018-09-02 | Discharge: 2018-09-02 | Disposition: A | Discharge status: Home / Self Care | Payer: Medicare HMO | Source: Ambulatory Visit | Attending: Student | Admitting: Student

## 2018-09-02 DIAGNOSIS — R1084 Generalized abdominal pain: Secondary | ICD-10-CM | POA: Insufficient documentation

## 2018-09-02 DIAGNOSIS — K529 Noninfective gastroenteritis and colitis, unspecified: Secondary | ICD-10-CM | POA: Insufficient documentation

## 2018-09-02 DIAGNOSIS — R634 Abnormal weight loss: Secondary | ICD-10-CM | POA: Diagnosis not present

## 2018-09-02 LAB — GASTROINTESTINAL PANEL BY PCR, STOOL (REPLACES STOOL CULTURE)

## 2018-09-02 LAB — C DIFFICILE QUICK SCREEN W PCR REFLEX??: C Diff toxin: NEGATIVE

## 2018-09-02 LAB — CLOSTRIDIUM DIFFICILE BY PCR, REFLEXED: Toxigenic C. Difficile by PCR: POSITIVE — AB

## 2018-09-02 LAB — C DIFFICILE QUICK SCREEN W PCR REFLEX: C Diff antigen: POSITIVE — AB

## 2018-09-05 DIAGNOSIS — A498 Other bacterial infections of unspecified site: Secondary | ICD-10-CM

## 2018-09-05 HISTORY — DX: Other bacterial infections of unspecified site: A49.8

## 2018-09-06 LAB — CALPROTECTIN, FECAL: Calprotectin, Fecal: 49 ug/g (ref 0–120)

## 2018-09-07 LAB — PANCREATIC ELASTASE, FECAL: Pancreatic Elastase-1, Stool: 259 ug Elast./g (ref >200)

## 2018-09-23 ENCOUNTER — Other Ambulatory Visit
Admission: RE | Admit: 2018-09-23 | Discharge: 2018-09-23 | Disposition: A | Discharge status: Home / Self Care | Payer: Medicare HMO | Source: Ambulatory Visit | Attending: Student | Admitting: Student

## 2018-09-23 DIAGNOSIS — Z8619 Personal history of other infectious and parasitic diseases: Secondary | ICD-10-CM | POA: Diagnosis not present

## 2018-09-23 DIAGNOSIS — K529 Noninfective gastroenteritis and colitis, unspecified: Secondary | ICD-10-CM | POA: Insufficient documentation

## 2018-09-23 LAB — GASTROINTESTINAL PANEL BY PCR, STOOL (REPLACES STOOL CULTURE)

## 2018-09-23 LAB — C DIFFICILE QUICK SCREEN W PCR REFLEX
C Diff antigen: NEGATIVE
C Diff interpretation: NOT DETECTED
C Diff toxin: NEGATIVE

## 2018-09-27 LAB — CALPROTECTIN, FECAL: Calprotectin, Fecal: 116 ug/g (ref 0–120)

## 2018-10-19 ENCOUNTER — Other Ambulatory Visit
Admission: RE | Admit: 2018-10-19 | Discharge: 2018-10-19 | Disposition: A | Discharge status: Home / Self Care | Payer: Medicare HMO | Source: Ambulatory Visit | Attending: Unknown Physician Specialty | Admitting: Unknown Physician Specialty

## 2018-10-19 ENCOUNTER — Other Ambulatory Visit: Payer: Self-pay

## 2018-10-19 DIAGNOSIS — Z1159 Encounter for screening for other viral diseases: Secondary | ICD-10-CM | POA: Diagnosis present

## 2018-10-20 LAB — NOVEL CORONAVIRUS, NAA (HOSP ORDER, SEND-OUT TO REF LAB; TAT 18-24 HRS): SARS-CoV-2, NAA: NOT DETECTED

## 2018-10-21 ENCOUNTER — Encounter: Payer: Self-pay | Admitting: *Deleted

## 2018-10-24 ENCOUNTER — Ambulatory Visit: Payer: Medicare HMO | Admitting: Anesthesiology

## 2018-10-24 ENCOUNTER — Encounter
Admission: RE | Disposition: A | Discharge status: Home / Self Care | Payer: Self-pay | Source: Home / Self Care | Attending: Unknown Physician Specialty

## 2018-10-24 ENCOUNTER — Other Ambulatory Visit: Payer: Self-pay

## 2018-10-24 ENCOUNTER — Ambulatory Visit
Admission: RE | Admit: 2018-10-24 | Discharge: 2018-10-24 | Disposition: A | Discharge status: Home / Self Care | Payer: Medicare HMO | Attending: Unknown Physician Specialty | Admitting: Unknown Physician Specialty

## 2018-10-24 DIAGNOSIS — Z7982 Long term (current) use of aspirin: Secondary | ICD-10-CM | POA: Insufficient documentation

## 2018-10-24 DIAGNOSIS — Z79899 Other long term (current) drug therapy: Secondary | ICD-10-CM | POA: Insufficient documentation

## 2018-10-24 DIAGNOSIS — E039 Hypothyroidism, unspecified: Secondary | ICD-10-CM | POA: Insufficient documentation

## 2018-10-24 DIAGNOSIS — K219 Gastro-esophageal reflux disease without esophagitis: Secondary | ICD-10-CM | POA: Diagnosis not present

## 2018-10-24 DIAGNOSIS — K648 Other hemorrhoids: Secondary | ICD-10-CM | POA: Diagnosis not present

## 2018-10-24 DIAGNOSIS — K319 Disease of stomach and duodenum, unspecified: Secondary | ICD-10-CM | POA: Diagnosis not present

## 2018-10-24 DIAGNOSIS — R197 Diarrhea, unspecified: Secondary | ICD-10-CM | POA: Diagnosis not present

## 2018-10-24 DIAGNOSIS — E785 Hyperlipidemia, unspecified: Secondary | ICD-10-CM | POA: Insufficient documentation

## 2018-10-24 DIAGNOSIS — J449 Chronic obstructive pulmonary disease, unspecified: Secondary | ICD-10-CM | POA: Diagnosis not present

## 2018-10-24 DIAGNOSIS — R1013 Epigastric pain: Secondary | ICD-10-CM | POA: Insufficient documentation

## 2018-10-24 DIAGNOSIS — Z8673 Personal history of transient ischemic attack (TIA), and cerebral infarction without residual deficits: Secondary | ICD-10-CM | POA: Insufficient documentation

## 2018-10-24 DIAGNOSIS — F419 Anxiety disorder, unspecified: Secondary | ICD-10-CM | POA: Diagnosis not present

## 2018-10-24 DIAGNOSIS — E7800 Pure hypercholesterolemia, unspecified: Secondary | ICD-10-CM | POA: Insufficient documentation

## 2018-10-24 DIAGNOSIS — R1084 Generalized abdominal pain: Secondary | ICD-10-CM | POA: Diagnosis not present

## 2018-10-24 DIAGNOSIS — Z79890 Hormone replacement therapy (postmenopausal): Secondary | ICD-10-CM | POA: Insufficient documentation

## 2018-10-24 DIAGNOSIS — Z87891 Personal history of nicotine dependence: Secondary | ICD-10-CM | POA: Diagnosis not present

## 2018-10-24 DIAGNOSIS — F329 Major depressive disorder, single episode, unspecified: Secondary | ICD-10-CM | POA: Insufficient documentation

## 2018-10-24 DIAGNOSIS — K6389 Other specified diseases of intestine: Secondary | ICD-10-CM | POA: Insufficient documentation

## 2018-10-24 DIAGNOSIS — D123 Benign neoplasm of transverse colon: Secondary | ICD-10-CM | POA: Insufficient documentation

## 2018-10-24 HISTORY — DX: Varicella without complication: B01.9

## 2018-10-24 HISTORY — DX: Unspecified osteoarthritis, unspecified site: M19.90

## 2018-10-24 HISTORY — PX: COLONOSCOPY WITH PROPOFOL: SHX5780

## 2018-10-24 HISTORY — DX: Hyperlipidemia, unspecified: E78.5

## 2018-10-24 HISTORY — DX: Disorder of thyroid, unspecified: E07.9

## 2018-10-24 HISTORY — DX: Allergy status to unspecified drugs, medicaments and biological substances: Z88.9

## 2018-10-24 HISTORY — PX: ESOPHAGOGASTRODUODENOSCOPY (EGD) WITH PROPOFOL: SHX5813

## 2018-10-24 SURGERY — COLONOSCOPY WITH PROPOFOL
Anesthesia: General

## 2018-10-24 MED ORDER — EPHEDRINE SULFATE 50 MG/ML IJ SOLN
INTRAMUSCULAR | Status: AC
  Filled 2018-10-24: qty 1

## 2018-10-24 MED ORDER — SODIUM CHLORIDE 0.9 % IV SOLN
INTRAVENOUS | Status: DC
  Administered 2018-10-24: 09:00:00 via INTRAVENOUS
  Administered 2018-10-24: 1000 mL via INTRAVENOUS

## 2018-10-24 MED ORDER — MIDAZOLAM HCL 2 MG/2ML IJ SOLN
INTRAMUSCULAR | Status: AC
  Filled 2018-10-24: qty 2

## 2018-10-24 MED ORDER — PROPOFOL 10 MG/ML IV BOLUS
INTRAVENOUS | Status: DC | PRN
  Administered 2018-10-24: 60 mg via INTRAVENOUS
  Administered 2018-10-24: 20 mg via INTRAVENOUS

## 2018-10-24 MED ORDER — FENTANYL CITRATE (PF) 100 MCG/2ML IJ SOLN
INTRAMUSCULAR | Status: AC
  Filled 2018-10-24: qty 2

## 2018-10-24 MED ORDER — EPHEDRINE SULFATE 50 MG/ML IJ SOLN
INTRAMUSCULAR | Status: DC | PRN
  Administered 2018-10-24: 10 mg via INTRAVENOUS

## 2018-10-24 MED ORDER — PHENYLEPHRINE HCL (PRESSORS) 10 MG/ML IV SOLN
INTRAVENOUS | Status: DC | PRN
  Administered 2018-10-24: 100 ug via INTRAVENOUS

## 2018-10-24 MED ORDER — PROPOFOL 500 MG/50ML IV EMUL
INTRAVENOUS | Status: AC
  Filled 2018-10-24: qty 50

## 2018-10-24 MED ORDER — MIDAZOLAM HCL 2 MG/2ML IJ SOLN
INTRAMUSCULAR | Status: DC | PRN
  Administered 2018-10-24 (×2): .5 mg via INTRAVENOUS

## 2018-10-24 MED ORDER — PROPOFOL 500 MG/50ML IV EMUL
INTRAVENOUS | Status: DC | PRN
  Administered 2018-10-24: 180 ug/kg/min via INTRAVENOUS

## 2018-10-24 MED ORDER — DEXAMETHASONE SODIUM PHOSPHATE 10 MG/ML IJ SOLN
INTRAMUSCULAR | Status: DC | PRN
  Administered 2018-10-24: 4 mg via INTRAVENOUS

## 2018-10-24 MED ORDER — ONDANSETRON HCL 4 MG/2ML IJ SOLN
INTRAMUSCULAR | Status: DC | PRN
  Administered 2018-10-24: 4 mg via INTRAVENOUS

## 2018-10-24 MED ORDER — FENTANYL CITRATE (PF) 100 MCG/2ML IJ SOLN
INTRAMUSCULAR | Status: DC | PRN
  Administered 2018-10-24 (×2): 25 ug via INTRAVENOUS

## 2018-10-24 NOTE — Anesthesia Postprocedure Evaluation (Signed)
Anesthesia Post Note  Patient: Brittany [name]  Procedure(s) Performed: COLONOSCOPY WITH PROPOFOL (N/A ) ESOPHAGOGASTRODUODENOSCOPY (EGD) WITH PROPOFOL (N/A )  Patient location during evaluation: PACU Anesthesia Type: General Level of consciousness: awake and alert Pain management: pain level controlled Vital Signs Assessment: post-procedure vital signs reviewed and stable Respiratory status: spontaneous breathing, nonlabored ventilation and respiratory function stable Cardiovascular status: blood pressure returned to baseline and stable Postop Assessment: no apparent nausea or vomiting Anesthetic complications: no     Last Vitals:  Vitals:   10/24/18 1000 10/24/18 1010  BP: (!) 103/48 108/67  Pulse: 75 63  Resp: 14 12  Temp:    SpO2: 100% 100%    Last Pain:  Vitals:   10/24/18 0950  TempSrc: Tympanic  PainSc:                  Durenda Hurt

## 2018-10-24 NOTE — Anesthesia Preprocedure Evaluation (Addendum)
Anesthesia Evaluation  Patient identified by MRN, date of birth, ID band Patient awake    Reviewed: Allergy & Precautions, H&P , NPO status , Patient's Chart, lab work & pertinent test results  History of Anesthesia Complications (+) PONV and history of anesthetic complications  Airway Mallampati: I  TM Distance: >3 FB     Dental  (+) Missing, Chipped, Poor Dentition   Pulmonary COPD, former smoker,           Cardiovascular negative cardio ROS       Neuro/Psych PSYCHIATRIC DISORDERS Anxiety Depression CVA    GI/Hepatic Neg liver ROS, hiatal hernia, GERD  Controlled,  Endo/Other  Hypothyroidism   Renal/GU negative Renal ROS  negative genitourinary   Musculoskeletal   Abdominal   Peds  Hematology  (+) Blood dyscrasia, anemia ,   Anesthesia Other Findings Past Medical History: No date: Allergic genetic state No date: Anemia No date: Anxiety No date: Arthritis No date: Cataract No date: Chicken pox No date: Complication of anesthesia No date: Depression No date: GERD (gastroesophageal reflux disease) No date: H/O hiatal hernia No date: Hypercholesteremia No date: Hyperlipidemia No date: Hypothyroidism No date: Osteoporosis No date: PONV (postoperative nausea and vomiting) No date: Thyroid disease  Past Surgical History: 2002: ABDOMINAL HYSTERECTOMY     Comment:  total 05/26/2012: BRAVO [place] STUDY     Comment:  Procedure: BRAVO [place] STUDY;  Surgeon: Lear Ng, MD;  Location: [facility] ENDOSCOPY;  Service:               Endoscopy;  Laterality: N/A; 2006: CHOLECYSTECTOMY 05/26/2012: ESOPHAGOGASTRODUODENOSCOPY     Comment:  Procedure: ESOPHAGOGASTRODUODENOSCOPY (EGD);  Surgeon:               Lear Ng, MD;  Location: [facility];                Service: Endoscopy;  Laterality: N/A; 05/30/2012: ESOPHAGOGASTRODUODENOSCOPY     Comment:  Procedure: ESOPHAGOGASTRODUODENOSCOPY (EGD);   Surgeon:               Cleotis Nipper, MD;  Location: [facility];  Service:               Endoscopy;  Laterality: N/A; 08/31/2012: ESOPHAGOGASTRODUODENOSCOPY; N/A     Comment:  Procedure: ESOPHAGOGASTRODUODENOSCOPY (EGD);  Surgeon:               Wonda Horner, MD;  Location: [facility] ENDOSCOPY;  Service:               Endoscopy;  Laterality: N/A; No date: EYE SURGERY 1970s: HIP SURGERY     Comment:  "hip sunk in" 2012: NECK SURGERY 2013 november: ROTATOR CUFF REPAIR     Comment:  right side No date: THYROID SURGERY     Comment:  nodule removed, right side 1992: TMJ ARTHROPLASTY 1981: TUBAL LIGATION     Reproductive/Obstetrics negative OB ROS                            Anesthesia Physical Anesthesia Plan  ASA: III  Anesthesia Plan: General   Post-op Pain Management:    Induction:   PONV Risk Score and Plan: Propofol infusion and TIVA  Airway Management Planned: Natural Airway and Nasal Cannula  Additional Equipment:   Intra-op Plan:   Post-operative Plan:   Informed Consent: I have reviewed the patients History  and Physical, chart, labs and discussed the procedure including the risks, benefits and alternatives for the proposed anesthesia with the patient or authorized representative who has indicated his/her understanding and acceptance.     Dental Advisory Given  Plan Discussed with: Anesthesiologist and CRNA  Anesthesia Plan Comments:         Anesthesia Quick Evaluation

## 2018-10-24 NOTE — Op Note (Signed)
[facility] Gastroenterology Patient Name: Brittany [name] Procedure Date: 10/24/2018 8:27 AM MRN: [id number] Account #: [id number] Date of Birth: 10-17-1950 Admit Type: Outpatient Age: 68 Room: [facility] ENDO ROOM 3 Gender: Female Note Status: Finalized Procedure:            Colonoscopy Indications:          Clinically significant diarrhea of unexplained origin Providers:            Manya Silvas, MD Medicines:            Propofol per Anesthesia Complications:        No immediate complications. Procedure:            Pre-Anesthesia Assessment:                       - After reviewing the risks and benefits, the patient                        was deemed in satisfactory condition to undergo the                        procedure.                       After obtaining informed consent, the colonoscope was                        passed under direct vision. Throughout the procedure,                        the patient's blood pressure, pulse, and oxygen                        saturations were monitored continuously. The                        Colonoscope was introduced through the anus and                        advanced to the the cecum, identified by appendiceal                        orifice and ileocecal valve. The colonoscopy was                        performed without difficulty. The patient tolerated the                        procedure well. The quality of the bowel preparation                        was good. Findings:      Two sessile polyps were found in the transverse colon. The polyps were       diminutive in size. These polyps were removed with a jumbo cold forceps.       Resection and retrieval were complete.      Biopsies done at 60cm. Done at 50 cm, done at 40cm, done at 30cm, done       at 20cm. Mucosa was normal and biopsies done for possible microscopic       colitis.      Internal  hemorrhoids were found during endoscopy. The hemorrhoids were   small. Impression:           - Two diminutive polyps in the transverse colon,                        removed with a jumbo cold forceps. Resected and                        retrieved.                       - Internal hemorrhoids. Recommendation:       - Await pathology results. Manya Silvas, MD 10/24/2018 9:49:06 AM This report has been signed electronically. Number of Addenda: 0 Note Initiated On: 10/24/2018 8:27 AM Scope Withdrawal Time: 0 hours 20 minutes 22 seconds  Total Procedure Duration: 0 hours 27 minutes 32 seconds       [facility], [place]

## 2018-10-24 NOTE — Op Note (Signed)
[facility] Gastroenterology Patient Name: Brittany [name] Procedure Date: 10/24/2018 8:27 AM MRN: [id number] Account #: [id number] Date of Birth: 1951-01-25 Admit Type: Outpatient Age: 68 Room: [facility] ENDO ROOM 3 Gender: Female Note Status: Finalized Procedure:            Upper GI endoscopy Indications:          Epigastric abdominal pain, Generalized abdominal pain Providers:            Manya Silvas, MD Referring MD:         Caprice Renshaw MD (Referring MD) Medicines:            Propofol per Anesthesia Complications:        No immediate complications. Procedure:            Pre-Anesthesia Assessment:                       - After reviewing the risks and benefits, the patient                        was deemed in satisfactory condition to undergo the                        procedure.                       After obtaining informed consent, the endoscope was                        passed under direct vision. Throughout the procedure,                        the patient's blood pressure, pulse, and oxygen                        saturations were monitored continuously. The Endoscope                        was introduced through the mouth, and advanced to the                        second part of duodenum. The upper GI endoscopy was                        accomplished without difficulty. The patient tolerated                        the procedure well. Findings:      The examined esophagus was normal. GEJ38cm.      Patchy mildly erythematous mucosa without bleeding was found in the       gastric body and in the gastric antrum. Biopsies were taken with a cold       forceps for histology. Biopsies were taken with a cold forceps for       Helicobacter pylori testing.      The examined duodenum was normal. Impression:           - Normal esophagus.                       - Erythematous mucosa in the gastric body and antrum.  Biopsied.       - Normal examined duodenum. Recommendation:       - Await pathology results. Manya Silvas, MD 10/24/2018 9:11:17 AM This report has been signed electronically. Number of Addenda: 0 Note Initiated On: 10/24/2018 8:27 AM Estimated Blood Loss: Estimated blood loss: none.      [facility]

## 2018-10-24 NOTE — Transfer of Care (Signed)
Immediate Anesthesia Transfer of Care Note  Patient: Brittany [name]  Procedure(s) Performed: COLONOSCOPY WITH PROPOFOL (N/A ) ESOPHAGOGASTRODUODENOSCOPY (EGD) WITH PROPOFOL (N/A )  Patient Location: PACU  Anesthesia Type:General  Level of Consciousness: awake, alert  and oriented  Airway & Oxygen Therapy: Patient Spontanous Breathing and Patient connected to nasal cannula oxygen  Post-op Assessment: Report given to RN and Post -op Vital signs reviewed and stable  Post vital signs: Reviewed and stable  Last Vitals:  Vitals Value Taken Time  BP 85/53 10/24/2018  9:50 AM  Temp 36.5 C 10/24/2018  9:50 AM  Pulse 80 10/24/2018  9:50 AM  Resp    SpO2 100 % 10/24/2018  9:50 AM    Last Pain:  Vitals:   10/24/18 0950  TempSrc: Tympanic  PainSc:          Complications: No apparent anesthesia complications

## 2018-10-24 NOTE — Anesthesia Post-op Follow-up Note (Signed)
Anesthesia QCDR form completed.        

## 2018-10-24 NOTE — H&P (Signed)
Primary Care Physician:  Derinda Late, MD Primary Gastroenterologist:  Dr. Vira Agar  Pre-Procedure History & Physical: HPI:  Brittany [name] is a 68 y.o. female is here for an endoscopy and colonoscopy.   Past Medical History:  Diagnosis Date  . Allergic genetic state   . Anemia   . Anxiety   . Arthritis   . Cataract   . Chicken pox   . Complication of anesthesia   . Depression   . GERD (gastroesophageal reflux disease)   . H/O hiatal hernia   . Hypercholesteremia   . Hyperlipidemia   . Hypothyroidism   . Osteoporosis   . PONV (postoperative nausea and vomiting)   . Thyroid disease     Past Surgical History:  Procedure Laterality Date  . ABDOMINAL HYSTERECTOMY  2002   total  . BRAVO [place] STUDY  05/26/2012   Procedure: BRAVO [place];  Surgeon: Lear Ng, MD;  Location: [facility] ENDOSCOPY;  Service: Endoscopy;  Laterality: N/A;  . CHOLECYSTECTOMY  2006  . ESOPHAGOGASTRODUODENOSCOPY  05/26/2012   Procedure: ESOPHAGOGASTRODUODENOSCOPY (EGD);  Surgeon: Lear Ng, MD;  Location: [facility]OS[facility];  Service: Endoscopy;  Laterality: N/A;  . ESOPHAGOGASTRODUODENOSCOPY  05/30/2012   Procedure: ESOPHAGOGASTRODUODENOSCOPY (EGD);  Surgeon: Cleotis Nipper, MD;  Location: [facility] ENDOSCOPY;  Service: Endoscopy;  Laterality: N/A;  . ESOPHAGOGASTRODUODENOSCOPY N/A 08/31/2012   Procedure: ESOPHAGOGASTRODUODENOSCOPY (EGD);  Surgeon: Wonda Horner, MD;  Location: [facility] ENDOSCOPY;  Service: Endoscopy;  Laterality: N/A;  . EYE SURGERY    . HIP SURGERY  1970s   "hip sunk in"  . NECK SURGERY  2012  . ROTATOR CUFF REPAIR  2013 november   right side  . THYROID SURGERY     nodule removed, right side  . TMJ ARTHROPLASTY  1992  . TUBAL LIGATION  1981    Prior to Admission medications   Medication Sig Start Date End Date Taking? Authorizing Provider  aluminum hydroxide-magnesium carbonate (GAVISCON) 95-358 MG/15ML SUSP Take 15 mLs by mouth every 6 (six) hours as needed (for gas).   Yes  [provider]  aspirin EC 81 MG tablet Take 81 mg by mouth daily.   Yes [provider]  calcium carbonate (OS-CAL) 600 MG TABS Take 600 mg by mouth 2 (two) times daily with a meal.   Yes [provider]  CHOLECALCIFEROL PO Take 2,000 Units by mouth daily.   Yes [provider]  citalopram (CELEXA) 40 MG tablet Take 40 mg by mouth every morning.    Yes [provider]  clorazepate (TRANXENE) 7.5 MG tablet Take 7.5 mg by mouth See admin instructions. Take 7.5 mg by mouth in the morning and at bedtime. May take an additional during the day if needed.   Yes [provider]  estradiol (ESTRACE) 0.1 MG/GM vaginal cream Place 1 Applicatorful vaginally daily.   Yes [provider]  famotidine (PEPCID) 20 MG tablet Take 20 mg by mouth daily.   Yes [provider]  fluticasone (FLONASE) 50 MCG/ACT nasal spray Place into both nostrils daily.   Yes [provider]  levothyroxine (SYNTHROID, LEVOTHROID) 25 MCG tablet Take 25 mcg by mouth every morning.    Yes [provider]  loratadine (CLARITIN) 10 MG tablet Take 10 mg by mouth at bedtime.   Yes [provider]  montelukast (SINGULAIR) 10 MG tablet Take 10 mg by mouth at bedtime.   Yes [provider]  Multiple Vitamins-Minerals (WOMENS 50+ MULTI VITAMIN/MIN PO) Take 1 tablet  by mouth daily.   Yes [provider]  prednisoLONE acetate (PRED FORTE) 1 % ophthalmic suspension Place 1 drop into the left eye 4 (four) times daily. 10/28/17  Yes [provider]  traZODone (DESYREL) 50 MG tablet Take 50 mg by mouth at bedtime.   Yes [provider]  atorvastatin (LIPITOR) 40 MG tablet Take 1 tablet (40 mg total) by mouth daily. 11/29/17 12/29/17  Saundra Shelling, MD  topiramate (TOPAMAX) 25 MG tablet Take 25 mg by mouth daily. 11/10/17 12/10/17  [provider]    Allergies as of 10/04/2018 - Review Complete 08/08/2018   Allergen Reaction Noted  . Lithium Anaphylaxis 12/13/2011  . Fentanyl Other (See Comments) 09/08/2012  . Morphine Nausea And Vomiting   . Pseudoephedrine Hypertension     Family History  Problem Relation Age of Onset  . Hypertension Mother   . Kidney disease Mother   . Heart attack Mother   . Osteoporosis Mother   . Lung cancer Father   . Cancer Sister   . Leukemia Sister   . Colon polyps Sister     Social History   Socioeconomic History  . Marital status: Divorced    Spouse name: Not on file  . Number of children: Not on file  . Years of education: Not on file  . Highest education level: Not on file  Occupational History  . Not on file  Social Needs  . Financial resource strain: Not on file  . Food insecurity:    Worry: Not on file    Inability: Not on file  . Transportation needs:    Medical: Not on file    Non-medical: Not on file  Tobacco Use  . Smoking status: Former Smoker    Last attempt to quit: 02/05/2008    Years since quitting: 10.7  . Smokeless tobacco: Never Used  Substance and Sexual Activity  . Alcohol use: No  . Drug use: No  . Sexual activity: Not on file  Lifestyle  . Physical activity:    Days per week: Not on file    Minutes per session: Not on file  . Stress: Not on file  Relationships  . Social connections:    Talks on phone: Not on file    Gets together: Not on file    Attends religious service: Not on file    Active member of club or organization: Not on file    Attends meetings of clubs or organizations: Not on file    Relationship status: Not on file  . Intimate partner violence:    Fear of current or ex partner: Not on file    Emotionally abused: Not on file    Physically abused: Not on file    Forced sexual activity: Not on file  Other Topics Concern  . Not on file  Social History Narrative  . Not on file    Review of Systems: See HPI, otherwise negative ROS  Physical Exam: BP 103/83   Pulse 70   Temp 97.7 F  (36.5 C) (Tympanic)   Ht 5\' 5"  (1.651 m)   Wt 52.2 kg   SpO2 100%   BMI 19.14 kg/m  General:   Alert,  pleasant and cooperative in NAD Head:  Normocephalic and atraumatic. Neck:  Supple; no masses or thyromegaly. Lungs:  Clear throughout to auscultation.    Heart:  Regular rate and rhythm. Abdomen:  Soft, nontender and nondistended. Normal bowel sounds, without guarding, and without rebound.   Neurologic:  Alert and  oriented x4;  grossly normal neurologically.  Impression/Plan: Brittany [name] is here for an endoscopy and colonoscopy to be performed for diarrhea and generalized abd pain. And GERD.   Risks, benefits, limitations, and alternatives regarding  endoscopy and colonoscopy have been reviewed with the patient.  Questions have been answered.  All parties agreeable.   Gaylyn Cheers, MD  10/24/2018, 8:52 AM

## 2018-10-24 NOTE — Anesthesia Procedure Notes (Signed)
Date/Time: 10/24/2018 9:00 AM Performed by: Allean Found, CRNA Pre-anesthesia Checklist: Patient identified, Emergency Drugs available, Suction available, Patient being monitored and Timeout performed Patient Re-evaluated:Patient Re-evaluated prior to induction Oxygen Delivery Method: Nasal cannula Placement Confirmation: positive ETCO2

## 2018-10-25 ENCOUNTER — Encounter: Payer: Self-pay | Admitting: Unknown Physician Specialty

## 2018-10-25 LAB — SURGICAL PATHOLOGY

## 2018-11-07 ENCOUNTER — Telehealth: Attending: Family Medicine | Primary: Family Medicine

## 2018-11-07 ENCOUNTER — Encounter: Attending: Family Medicine | Primary: Family Medicine

## 2018-11-07 ENCOUNTER — Telehealth: Admit: 2018-11-07 | Discharge: 2018-11-07 | Attending: Family Medicine | Primary: Family Medicine

## 2018-11-07 DIAGNOSIS — I10 Essential (primary) hypertension: Secondary | ICD-10-CM

## 2018-11-07 MED ORDER — LISINOPRIL 40 MG TAB
40 mg | ORAL_TABLET | Freq: Every day | ORAL | 0 refills | Status: DC
Start: 2018-11-07 — End: 2019-02-02

## 2018-11-07 NOTE — Patient Instructions (Addendum)
DASH Diet: Care Instructions  Your Care Instructions     The DASH diet is an eating plan that can help lower your blood pressure. DASH stands for Dietary Approaches to Stop Hypertension. Hypertension is high blood pressure.  The DASH diet focuses on eating foods that are high in calcium, potassium, and magnesium. These nutrients can lower blood pressure. The foods that are highest in these nutrients are fruits, vegetables, low-fat dairy products, nuts, seeds, and legumes. But taking calcium, potassium, and magnesium supplements instead of eating foods that are high in those nutrients does not have the same effect. The DASH diet also includes whole grains, fish, and poultry.  The DASH diet is one of several lifestyle changes your doctor may recommend to lower your high blood pressure. Your doctor may also want you to decrease the amount of sodium in your diet. Lowering sodium while following the DASH diet can lower blood pressure even further than just the DASH diet alone.  Follow-up care is a key part of your treatment and safety. Be sure to make and go to all appointments, and call your doctor if you are having problems. It's also a good idea to know your test results and keep a list of the medicines you take.  How can you care for yourself at home?  Following the DASH diet  ?? Eat 4 to 5 servings of fruit each day. A serving is 1 medium-sized piece of fruit, ?? cup chopped or canned fruit, 1/4 cup dried fruit, or 4 ounces (?? cup) of fruit juice. Choose fruit more often than fruit juice.  ?? Eat 4 to 5 servings of vegetables each day. A serving is 1 cup of lettuce or raw leafy vegetables, ?? cup of chopped or cooked vegetables, or 4 ounces (?? cup) of vegetable juice. Choose vegetables more often than vegetable juice.  ?? Get 2 to 3 servings of low-fat and fat-free dairy each day. A serving is 8 ounces of milk, 1 cup of yogurt, or 1 ?? ounces of cheese.   ?? Eat 6 to 8 servings of grains each day. A serving is 1 slice of bread, 1 ounce of dry cereal, or ?? cup of cooked rice, pasta, or cooked cereal. Try to choose whole-grain products as much as possible.  ?? Limit lean meat, poultry, and fish to 2 servings each day. A serving is 3 ounces, about the size of a deck of cards.  ?? Eat 4 to 5 servings of nuts, seeds, and legumes (cooked dried beans, lentils, and split peas) each week. A serving is 1/3 cup of nuts, 2 tablespoons of seeds, or ?? cup of cooked beans or peas.  ?? Limit fats and oils to 2 to 3 servings each day. A serving is 1 teaspoon of vegetable oil or 2 tablespoons of salad dressing.  ?? Limit sweets and added sugars to 5 servings or less a week. A serving is 1 tablespoon jelly or jam, ?? cup sorbet, or 1 cup of lemonade.  ?? Eat less than 2,300 milligrams (mg) of sodium a day. If you limit your sodium to 1,500 mg a day, you can lower your blood pressure even more.  Tips for success  ?? Start small. Do not try to make dramatic changes to your diet all at once. You might feel that you are missing out on your favorite foods and then be more likely to not follow the plan. Make small changes, and stick with them. Once those changes become habit, add   a few more changes.  ?? Try some of the following:  ? Make it a goal to eat a fruit or vegetable at every meal and at snacks. This will make it easy to get the recommended amount of fruits and vegetables each day.  ? Try yogurt topped with fruit and nuts for a snack or healthy dessert.  ? Add lettuce, tomato, cucumber, and onion to sandwiches.  ? Combine a ready-made pizza crust with low-fat mozzarella cheese and lots of vegetable toppings. Try using tomatoes, squash, spinach, broccoli, carrots, cauliflower, and onions.  ? Have a variety of cut-up vegetables with a low-fat dip as an appetizer instead of chips and dip.  ? Sprinkle sunflower seeds or chopped almonds over salads. Or try adding  chopped walnuts or almonds to cooked vegetables.  ? Try some vegetarian meals using beans and peas. Add garbanzo or kidney beans to salads. Make burritos and tacos with mashed pinto beans or black beans.  Where can you learn more?  Go to http://www.healthwise.net/[employer]  Enter H967 in the search box to learn more about "DASH Diet: Care Instructions."  Current as of: May 02, 2018??????????????????????????????Content Version: 12.5  ?? 2006-2020 [employer].   Care instructions adapted under license by [employer] (which disclaims liability or warranty for this information). If you have questions about a medical condition or this instruction, always ask your healthcare professional. [employer] disclaims any warranty or liability for your use of this information.

## 2018-11-07 NOTE — Progress Notes (Signed)
Cheryl Paul is a 68 y.o. female who was seen by synchronous (real-time) audio-video technology on 11/07/2018.      Consent: Cheryl Paul, who was seen by synchronous (real-time) audio-video technology, and/or her healthcare decision maker, is aware that this patient-initiated, Telehealth encounter on 11/07/2018 is a billable service, with coverage as determined by her insurance carrier. She is aware that she may receive a bill and has provided verbal consent to proceed: Yes.    Subjective:   Cheryl Paul is a 68 y.o. female who was seen for No chief complaint on file.    Establish care    HTN- long standing h/o. Says she is currently on lisinopril only.  Her BP ranges 140-150/70-80's.  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 Reviewed labs 05/2018 LDL <100, a1c 5.4    DM- now diet controlled, she does not check glucose at home. She is no longer on metformin.  she is overdue for eye exam, pt reminded    HL- says she was taken off statin recently as well.    Asthma ?COPD- non smoker, denies cough, sob, wheezing. She says last albuterol use was a year ago. Usually heat triggers respiratory symptoms.        Prior to Admission medications    Medication Sig Start Date End Date Taking? Authorizing Provider   lisinopriL (PRINIVIL, ZESTRIL) 40 mg tablet Take 1 Tab by mouth daily. 11/07/18  Yes Divine Imber, Hanley SeamenMargie P, MD   calcium citrate 200 mg (950 mg) tablet Take 1 Tab by mouth daily. Indications: malabsorption 05/23/18  Yes Areatha KeasWyatt, Armando J, MD   multivitamin (ONE A DAY) tablet Take 1 Tab by mouth daily.   Yes Provider, Historical   thiamine HCL (B-1) 100 mg tablet Take  by mouth daily.   Yes Provider, Historical   cyanocobalamin 1,000 mcg tablet Take 1,000 mcg by mouth daily.   Yes Provider, Historical   cholecalciferol, VITAMIN D3, (VITAMIN D3) 5,000 unit tab tablet Take 5,000  Units by mouth daily.   Yes Provider, Historical   albuterol (PROVENTIL HFA, VENTOLIN HFA, PROAIR HFA) 90 mcg/actuation inhaler Take 1-2 Puffs by inhalation every four (4) hours as needed for Wheezing. 05/24/17  Yes Darryl LentJohnson, Jeffrey D, MD   nystatin (MYCOSTATIN) topical cream Apply  to affected area two (2) times a day. 07/17/15  Yes Shelle IronWright, Teresa R, DO   magnesium oxide (MAG-OX) 400 mg tablet Take 1 Tab by mouth daily. 05/23/18 11/07/18  Areatha KeasWyatt, Armando J, MD   potassium bicarb-citric acid (EFFER-K) 20 mEq tablet Take 2 Tabs by mouth daily. 05/22/18 11/07/18  Areatha KeasWyatt, Armando J, MD   hyoscyamine sulfate (CYSTOSPAZ) 0.125 mg tablet 1 Tab by SubLINGual route every six (6) hours as needed for Other (Spasms). 02/23/18 11/07/18  Meibers, Debbora LacrosseKristi N, NP   lisinopril (PRINIVIL, ZESTRIL) 40 mg tablet Take 1 Tab by mouth daily. 02/23/18 11/07/18  Meibers, Debbora LacrosseKristi N, NP   pantoprazole (PROTONIX) 40 mg tablet Take 1 Tab by mouth Daily (before breakfast). 02/23/18 11/07/18  Meibers, Debbora LacrosseKristi N, NP   ondansetron (ZOFRAN ODT) 4 mg disintegrating tablet Take 1 Tab by mouth every eight (8) hours as needed for Nausea. 02/22/18 11/07/18  Estevan RyderAnderson, Talisha D, NP   alcohol swabs padm Use daily to test blood sugar  DX E11.9 07/17/15   Langston MaskerWright, Teresa R, DO     Allergies   Allergen Reactions   ??? Penicillins Itching   ???  Butrans [Buprenorphine] Rash   ??? Codeine Other (comments)     Nausea and abd pain   ??? Iodine Rash   ??? Lactose Diarrhea     Lactose intolerance   ??? Other Plant, Animal, Environmental Hives     Conductive electrodes       Patient Active Problem List    Diagnosis Date Noted   ??? Hypokalemia 05/15/2018   ??? Hypomagnesemia 05/15/2018   ??? Syncope 05/15/2018   ??? Elevated troponin 05/15/2018   ??? Short gut syndrome 05/15/2018   ??? Hypocalcemia 05/15/2018   ??? Morbid obesity with BMI of 45.0-49.9, adult ([place]) 02/21/2018   ??? Obesity, morbid ([place]) 08/16/2017   ??? Internal hemorrhoids 04/18/2014   ??? Colon polyps 04/18/2014   ??? Vitamin D deficiency 02/19/2014    ??? Chronic infection of sinus 11/14/2013   ??? Type 2 diabetes mellitus without complication ([place]) 10/96/0454   ??? Incontinence in female 11/14/2013   ??? Candidal intertrigo 11/14/2013   ??? Arthritis, multiple joint involvement 11/14/2013   ??? Essential hypertension 11/14/2013   ??? COPD (chronic obstructive pulmonary disease) ([place]) 11/14/2013   ??? Hyperlipidemia 11/14/2013     Current Outpatient Medications   Medication Sig Dispense Refill   ??? lisinopriL (PRINIVIL, ZESTRIL) 40 mg tablet Take 1 Tab by mouth daily. 90 Tab 0   ??? calcium citrate 200 mg (950 mg) tablet Take 1 Tab by mouth daily. Indications: malabsorption 30 Tab 0   ??? multivitamin (ONE A DAY) tablet Take 1 Tab by mouth daily.     ??? thiamine HCL (B-1) 100 mg tablet Take  by mouth daily.     ??? cyanocobalamin 1,000 mcg tablet Take 1,000 mcg by mouth daily.     ??? cholecalciferol, VITAMIN D3, (VITAMIN D3) 5,000 unit tab tablet Take 5,000 Units by mouth daily.     ??? albuterol (PROVENTIL HFA, VENTOLIN HFA, PROAIR HFA) 90 mcg/actuation inhaler Take 1-2 Puffs by inhalation every four (4) hours as needed for Wheezing. 1 Inhaler 0   ??? nystatin (MYCOSTATIN) topical cream Apply  to affected area two (2) times a day. 15 g 1   ??? alcohol swabs padm Use daily to test blood sugar  DX E11.9 100 Pad 1     Allergies   Allergen Reactions   ??? Penicillins Itching   ??? Butrans [Buprenorphine] Rash   ??? Codeine Other (comments)     Nausea and abd pain   ??? Iodine Rash   ??? Lactose Diarrhea     Lactose intolerance   ??? Other Plant, Animal, Environmental Hives     Conductive electrodes     Past Medical History:   Diagnosis Date   ??? Arthritis     knee, arms,    ??? Asthma    ??? Chronic obstructive pulmonary disease ([place])    ??? Chronic pain    ??? Diabetes ([place])    ??? H/O sinusitis    ??? Hypercholesterolemia    ??? Hypertension    ??? Sleep apnea     NOT USING    ??? Stress incontinence    ??? Toe fracture, left August 2015    4th toe     Past Surgical History:   Procedure Laterality Date    ??? ABDOMEN SURGERY PROC UNLISTED      4 surguries for blockages   ??? HX CARPAL TUNNEL RELEASE Bilateral    ??? HX CHOLECYSTECTOMY     ??? HX GYN     ??? HX HERNIA REPAIR  abdomen   ??? HX HYSTERECTOMY      tubes tied   ??? 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 HX ORTHOPAEDIC      catrpal tunnel right and left   ??? HX POLYPECTOMY  04/18/14     Family History   Problem Relation Age of Onset   ??? Hypertension Mother    ??? Stroke Mother    ??? Diabetes Mother    ??? Thyroid Disease Mother    ??? Arthritis-rheumatoid Mother    ??? Cancer Father         colon polpe   ??? Cancer Maternal Aunt      Social History     Tobacco Use   ??? Smoking status: Never Smoker   ??? Smokeless tobacco: Never Used   Substance Use Topics   ??? Alcohol use: No     Alcohol/week: 0.0 standard drinks       ROS    Objective:   Vital Signs: (As obtained by patient/caregiver at home)  There were no vitals taken for this visit.     [INSTRUCTIONS:  "[x] " Indicates a positive item  "[] " Indicates a negative item  -- DELETE ALL ITEMS NOT EXAMINED]    Constitutional: [x]  Appears well-developed and well-nourished [x]  No apparent distress      []  Abnormal -     Mental status: [x]  Alert and awake  [x]  Oriented to person/place/time [x]  Able to follow commands    []  Abnormal -     Eyes:   EOM    [x]   Normal    []  Abnormal -   Sclera  [x]   Normal    []  Abnormal -          Discharge [x]   None visible   []  Abnormal -     HENT: [x]  Normocephalic, atraumatic  []  Abnormal -   [x]  Mouth/Throat: Mucous membranes are moist    External Ears [x]  Normal  []  Abnormal -    Neck: [x]  No visualized mass []  Abnormal -     Pulmonary/Chest: [x]  Respiratory effort normal   [x]  No visualized signs of difficulty breathing or respiratory distress        []  Abnormal -      Musculoskeletal:   [x]  Normal gait with no signs of ataxia         [x]  Normal range of motion of neck        []  Abnormal -     Neurological:        [x]  No Facial Asymmetry (Cranial nerve 7 motor  function) (limited exam due to video visit)          [x]  No gaze palsy        []  Abnormal -          Skin:        [x]  No significant exanthematous lesions or discoloration noted on facial skin         []  Abnormal -            Psychiatric:       [x]  Normal Affect []  Abnormal -        [x]  No Hallucinations    Other pertinent observable physical exam findings:-        We discussed the expected course, resolution and complications of the diagnosis(es) in detail.  Medication risks, benefits, costs, interactions, and alternatives were discussed as indicated.  I advised her to contact the office if her condition worsens, changes  or fails to improve as anticipated. She expressed understanding with the diagnosis(es) and plan.       Cheryl Paul is a 68 y.o. female who was evaluated by a video visit encounter for concerns as above. Patient identification was verified prior to start of the visit. A caregiver was present when appropriate. Due to this being a Scientist, research (medical)TeleHealth encounter (During COVID-19 public health emergency), evaluation of the following organ systems was limited: Vitals/Constitutional/EENT/Resp/CV/GI/GU/MS/Neuro/Skin/Heme-Lymph-Imm.  Pursuant to the emergency declaration under the [facility] Act and the [employer] Emergencies Act, 1135 waiver authority and the [employer]on[employer] Preparedness and [employer] Supplemental Appropriations Act, this Virtual  Visit was conducted, with patient's (and/or legal guardian's) consent, to reduce the patient's risk of exposure to COVID-19 and provide necessary medical care.     Services were provided through a video synchronous discussion virtually to substitute for in-person clinic visit.   Patient and provider were located at their individual homes.      Assessment & Plan:   Diagnoses and all orders for this visit:    1. Essential hypertension  Advised BP monitoring  DASH diet  Cont lisinopril    2. Post-menopausal  -     DEXA BONE DENSITY STUDY AXIAL; Future     3. Diabetes mellitus type 2, diet-controlled ([facility])  Off meds after gastric bypass  a1c 05/2018 5.4  Monitoring  Due for eye exam, pt reminded, she is to schedule  -     HEMOGLOBIN A1C W/O EAG; Future  -     METABOLIC PANEL, COMPREHENSIVE; Future  -     MICROALBUMIN, UR, RAND W/ MICROALB/CREAT RATIO; Future    4. Mild intermittent asthma, unspecified whether complicated  Controlled  Cont prn albuterol    5. Pure hypercholesterolemia  LDL < 100 05/2018  Pt recently came off statin  Monitoring    Other orders  -     lisinopriL (PRINIVIL, ZESTRIL) 40 mg tablet; Take 1 Tab by mouth daily.      Ff-up in 3 months, plan for [facility] then    Mable FillMargie P Darlette Dubow, MD

## 2018-11-07 NOTE — Progress Notes (Signed)
Cheryl Paul is a 68 y.o. female who was seen by synchronous (real-time) audio-video technology on 11/07/2018.      Consent: Cheryl Paul, who was seen by synchronous (real-time) audio-video technology, and/or her healthcare decision maker, is aware that this patient-initiated, Telehealth encounter on 11/07/2018 is a billable service, with coverage as determined by her insurance carrier. She is aware that she may receive a bill and has provided verbal consent to proceed: Yes.    Subjective:   Cheryl Paul is a 68 y.o. female who was seen for No chief complaint on file.    Establish care    HTN- long standing h/o. Says she is currently on lisinopril only.  Her BP ranges 140-150/70-80's.  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 Reviewed labs 05/2018 LDL <100, a1c 5.4    DM- now diet controlled, she does not check glucose at home. She is no longer on metformin.  she is overdue for eye exam, pt reminded    HL- says she was taken off statin recently as well.    Asthma ?COPD- non smoker, denies cough, sob, wheezing. She says last albuterol use was a year ago. Usually heat triggers respiratory symptoms.        Prior to Admission medications    Medication Sig Start Date End Date Taking? Authorizing Provider   lisinopriL (PRINIVIL, ZESTRIL) 40 mg tablet Take 1 Tab by mouth daily. 11/07/18  Yes Yudit Modesitt, Hanley SeamenMargie P, MD   calcium citrate 200 mg (950 mg) tablet Take 1 Tab by mouth daily. Indications: malabsorption 05/23/18  Yes Areatha KeasWyatt, Armando J, MD   multivitamin (ONE A DAY) tablet Take 1 Tab by mouth daily.   Yes Provider, Historical   thiamine HCL (B-1) 100 mg tablet Take  by mouth daily.   Yes Provider, Historical   cyanocobalamin 1,000 mcg tablet Take 1,000 mcg by mouth daily.   Yes Provider, Historical   cholecalciferol, VITAMIN D3, (VITAMIN D3) 5,000 unit tab tablet Take 5,000 Units by mouth daily.   Yes  Provider, Historical   albuterol (PROVENTIL HFA, VENTOLIN HFA, PROAIR HFA) 90 mcg/actuation inhaler Take 1-2 Puffs by inhalation every four (4) hours as needed for Wheezing. 05/24/17  Yes Darryl LentJohnson, Jeffrey D, MD   nystatin (MYCOSTATIN) topical cream Apply  to affected area two (2) times a day. 07/17/15  Yes Shelle IronWright, Teresa R, DO   magnesium oxide (MAG-OX) 400 mg tablet Take 1 Tab by mouth daily. 05/23/18 11/07/18  Areatha KeasWyatt, Armando J, MD   potassium bicarb-citric acid (EFFER-K) 20 mEq tablet Take 2 Tabs by mouth daily. 05/22/18 11/07/18  Areatha KeasWyatt, Armando J, MD   hyoscyamine sulfate (CYSTOSPAZ) 0.125 mg tablet 1 Tab by SubLINGual route every six (6) hours as needed for Other (Spasms). 02/23/18 11/07/18  Meibers, Debbora LacrosseKristi N, NP   lisinopril (PRINIVIL, ZESTRIL) 40 mg tablet Take 1 Tab by mouth daily. 02/23/18 11/07/18  Meibers, Debbora LacrosseKristi N, NP   pantoprazole (PROTONIX) 40 mg tablet Take 1 Tab by mouth Daily (before breakfast). 02/23/18 11/07/18  Meibers, Debbora LacrosseKristi N, NP   ondansetron (ZOFRAN ODT) 4 mg disintegrating tablet Take 1 Tab by mouth every eight (8) hours as needed for Nausea. 02/22/18 11/07/18  Estevan RyderAnderson, Talisha D, NP   alcohol swabs padm Use daily to test blood sugar  DX E11.9 07/17/15   Langston MaskerWright, Teresa R, DO     Allergies   Allergen Reactions   ??? Penicillins Itching   ???  Butrans [Buprenorphine] Rash   ??? Codeine Other (comments)     Nausea and abd pain   ??? Iodine Rash   ??? Lactose Diarrhea     Lactose intolerance   ??? Other Plant, Animal, Environmental Hives     Conductive electrodes       Patient Active Problem List    Diagnosis Date Noted   ??? Hypokalemia 05/15/2018   ??? Hypomagnesemia 05/15/2018   ??? Syncope 05/15/2018   ??? Elevated troponin 05/15/2018   ??? Short gut syndrome 05/15/2018   ??? Hypocalcemia 05/15/2018   ??? Morbid obesity with BMI of 45.0-49.9, adult (HCC) 02/21/2018   ??? Obesity, morbid (HCC) 08/16/2017   ??? Internal hemorrhoids 04/18/2014   ??? Colon polyps 04/18/2014   ??? Vitamin D deficiency 02/19/2014   ??? Chronic infection of sinus  11/14/2013   ??? Type 2 diabetes mellitus without complication (HCC) 11/14/2013   ??? Incontinence in female 11/14/2013   ??? Candidal intertrigo 11/14/2013   ??? Arthritis, multiple joint involvement 11/14/2013   ??? Essential hypertension 11/14/2013   ??? COPD (chronic obstructive pulmonary disease) (HCC) 11/14/2013   ??? Hyperlipidemia 11/14/2013     Current Outpatient Medications   Medication Sig Dispense Refill   ??? lisinopriL (PRINIVIL, ZESTRIL) 40 mg tablet Take 1 Tab by mouth daily. 90 Tab 0   ??? calcium citrate 200 mg (950 mg) tablet Take 1 Tab by mouth daily. Indications: malabsorption 30 Tab 0   ??? multivitamin (ONE A DAY) tablet Take 1 Tab by mouth daily.     ??? thiamine HCL (B-1) 100 mg tablet Take  by mouth daily.     ??? cyanocobalamin 1,000 mcg tablet Take 1,000 mcg by mouth daily.     ??? cholecalciferol, VITAMIN D3, (VITAMIN D3) 5,000 unit tab tablet Take 5,000 Units by mouth daily.     ??? albuterol (PROVENTIL HFA, VENTOLIN HFA, PROAIR HFA) 90 mcg/actuation inhaler Take 1-2 Puffs by inhalation every four (4) hours as needed for Wheezing. 1 Inhaler 0   ??? nystatin (MYCOSTATIN) topical cream Apply  to affected area two (2) times a day. 15 g 1   ??? alcohol swabs padm Use daily to test blood sugar  DX E11.9 100 Pad 1     Allergies   Allergen Reactions   ??? Penicillins Itching   ??? Butrans [Buprenorphine] Rash   ??? Codeine Other (comments)     Nausea and abd pain   ??? Iodine Rash   ??? Lactose Diarrhea     Lactose intolerance   ??? Other Plant, Animal, Environmental Hives     Conductive electrodes     Past Medical History:   Diagnosis Date   ??? Arthritis     knee, arms,    ??? Asthma    ??? Chronic obstructive pulmonary disease (HCC)    ??? Chronic pain    ??? Diabetes (HCC)    ??? H/O sinusitis    ??? Hypercholesterolemia    ??? Hypertension    ??? Sleep apnea     NOT USING    ??? Stress incontinence    ??? Toe fracture, left August 2015    4th toe     Past Surgical History:   Procedure Laterality Date   ??? ABDOMEN SURGERY PROC UNLISTED      4 surguries  for blockages   ??? HX CARPAL TUNNEL RELEASE Bilateral    ??? HX CHOLECYSTECTOMY     ??? HX GYN     ??? HX HERNIA REPAIR  abdomen   ??? HX HYSTERECTOMY      tubes tied   ??? 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 HX ORTHOPAEDIC      catrpal tunnel right and left   ??? HX POLYPECTOMY  04/18/14     Family History   Problem Relation Age of Onset   ??? Hypertension Mother    ??? Stroke Mother    ??? Diabetes Mother    ??? Thyroid Disease Mother    ??? Arthritis-rheumatoid Mother    ??? Cancer Father         colon polpe   ??? Cancer Maternal Aunt      Social History     Tobacco Use   ??? Smoking status: Never Smoker   ??? Smokeless tobacco: Never Used   Substance Use Topics   ??? Alcohol use: No     Alcohol/week: 0.0 standard drinks       ROS    Objective:   Vital Signs: (As obtained by patient/caregiver at home)  There were no vitals taken for this visit.     [INSTRUCTIONS:  "[x] " Indicates a positive item  "[] " Indicates a negative item  -- DELETE ALL ITEMS NOT EXAMINED]    Constitutional: [x]  Appears well-developed and well-nourished [x]  No apparent distress      []  Abnormal -     Mental status: [x]  Alert and awake  [x]  Oriented to person/place/time [x]  Able to follow commands    []  Abnormal -     Eyes:   EOM    [x]   Normal    []  Abnormal -   Sclera  [x]   Normal    []  Abnormal -          Discharge [x]   None visible   []  Abnormal -     HENT: [x]  Normocephalic, atraumatic  []  Abnormal -   [x]  Mouth/Throat: Mucous membranes are moist    External Ears [x]  Normal  []  Abnormal -    Neck: [x]  No visualized mass []  Abnormal -     Pulmonary/Chest: [x]  Respiratory effort normal   [x]  No visualized signs of difficulty breathing or respiratory distress        []  Abnormal -      Musculoskeletal:   [x]  Normal gait with no signs of ataxia         [x]  Normal range of motion of neck        []  Abnormal -     Neurological:        [x]  No Facial Asymmetry (Cranial nerve 7 motor function) (limited exam due to video visit)          [x]  No gaze palsy        []  Abnormal -           Skin:        [x]  No significant exanthematous lesions or discoloration noted on facial skin         []  Abnormal -            Psychiatric:       [x]  Normal Affect []  Abnormal -        [x]  No Hallucinations    Other pertinent observable physical exam findings:-        We discussed the expected course, resolution and complications of the diagnosis(es) in detail.  Medication risks, benefits, costs, interactions, and alternatives were discussed as indicated.  I advised her to contact the office if her condition worsens, changes  or fails to improve as anticipated. She expressed understanding with the diagnosis(es) and plan.       Cheryl Paul is a 68 y.o. female who was evaluated by a video visit encounter for concerns as above. Patient identification was verified prior to start of the visit. A caregiver was present when appropriate. Due to this being a Scientist, physiological (During NUUVO-53 public health emergency), evaluation of the following organ systems was limited: Vitals/Constitutional/EENT/Resp/CV/GI/GU/MS/Neuro/Skin/Heme-Lymph-Imm.  Pursuant to the emergency declaration under the [place], 1135 waiver authority and the [employer] Act, this Virtual  Visit was conducted, with patient's (and/or legal guardian's) consent, to reduce the patient's risk of exposure to COVID-19 and provide necessary medical care.     Services were provided through a video synchronous discussion virtually to substitute for in-person clinic visit.   Patient and provider were located at their individual homes.      Assessment & Plan:   Diagnoses and all orders for this visit:    1. Essential hypertension  Advised BP monitoring  DASH diet  Cont lisinopril    2. Post-menopausal  -     DEXA BONE DENSITY STUDY AXIAL; Future    3. Diabetes mellitus type 2, diet-controlled ([place])  Off meds after gastric bypass  a1c 05/2018 5.4  Monitoring  Due for  eye exam, pt reminded, she is to schedule  -     HEMOGLOBIN A1C W/O EAG; Future  -     METABOLIC PANEL, COMPREHENSIVE; Future  -     MICROALBUMIN, UR, RAND W/ MICROALB/CREAT RATIO; Future    4. Mild intermittent asthma, unspecified whether complicated  Controlled  Cont prn albuterol    5. Pure hypercholesterolemia  LDL < 100 05/2018  Pt recently came off statin  Monitoring    Other orders  -     lisinopriL (PRINIVIL, ZESTRIL) 40 mg tablet; Take 1 Tab by mouth daily.      Ff-up in 3 months, plan for [place] then    Dalene Seltzer, MD

## 2018-11-10 NOTE — Telephone Encounter (Signed)
Pt called and stated she was told to call back with some information regarding  her last appt with  Dr. Janeal Holmes [facility] her appt was on 11/26/2017

## 2018-11-10 NOTE — Telephone Encounter (Signed)
Care team updated. Called Dr. Reed Pandy office and left message to send dilated eye exam

## 2018-11-11 ENCOUNTER — Ambulatory Visit: Attending: Specialist | Primary: Family Medicine

## 2018-11-11 ENCOUNTER — Ambulatory Visit: Admit: 2018-11-11 | Discharge: 2018-11-11 | Payer: MEDICARE | Attending: Specialist | Primary: Family Medicine

## 2018-11-11 DIAGNOSIS — M7061 Trochanteric bursitis, right hip: Secondary | ICD-10-CM

## 2018-11-11 NOTE — Progress Notes (Signed)
Pt reports lateral B/L leg pain. Also c/o B/L shoulder pain and Rt hip pain. States that she fell about 2 years ago and has hip pain since then. Pt states that she's lost weight and wants to consider sx.

## 2018-11-11 NOTE — Progress Notes (Signed)
Patient: Cheryl Paul                MRN: [id number]       SSN: [id number]  Date of Birth: 14-Mar-1951        AGE: [age] y.o.        SEX: female      PCP: Mable FillPascual, Margie P, MD  11/11/18    Chief Complaint   Patient presents with   ??? Leg Pain     B/L   ??? Shoulder Pain     B/L   ??? Hip Pain     Rt     HISTORY:  Cheryl Paul is a 68 y.o. female who is seen for right hip and knee pain. She has been experiencing right hip pain for the past several months. She states that she fell many years ago. She states that she aches when sleeping. She says she has knots in her right hip.She feels pain in her lateral hip with standing and walking. Her hip  stiffens up after she sits for long periods of time.     She is also seen for bilateral knee pain. She has been experiencing worsening pain for the past few years. She notes that her knee pain has decreased since bariatric surgery. She states previous x-rays of her knees showed severe osteoarthritis.  She states that her knees pop like popcorn.  She was previously seen at [facility] by Dr. Hollice EspyGibson about two years ago.  She states she had previous cortisone injections.  She has difficulty standing and sitting.  She previously saw a neurosurgereon who discussed doing an laminectomy.  She has posterior burning sensations in her knees.      She was previously seen for bilateral shoulder pain. She has shoulder pain radiating into her upper arms.      She underwent bariatric surgery on 02/22/18 by Dr. Karleen HampshireSpencer. She was 285# before surgery and now weighs 193#.    She sees Dr. Nelva BushWinke for pain management. She was previously seen by Dr. Paulina FusiWardell but is no longer able to [facility] since she switched insurances.     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  She has two great grandchildren ages 1410 and 93.  She has 5 grandchildren. HGA1C 10.4 as of 04/06/16. [facility] but had to stop due to the Coronavirus shutdown. She states that her PT in [place] increased her knee pain. She weighs herself every Friday. Current weight and height are 192 lbs and 5'3".     Last 3 Recorded Weights in this Encounter    11/11/18 1017   Weight: 192 lb 9.6 oz (87.4 kg)     Body mass index is 34.12 kg/m??.    Patient Active Problem List   Diagnosis Code   ??? 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 Type 2 diabetes mellitus without complication (HCC)  E11.9   ??? Incontinence in female R32   ??? Candidal intertrigo B37.2   ??? 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 Essential hypertension I10   ??? COPD (chronic obstructive pulmonary disease) (HCC) J44.9   ??? Hyperlipidemia E78.5   ??? Vitamin D deficiency E55.9   ??? Internal hemorrhoids K64.8   ??? Colon polyps K63.5   ??? Obesity, morbid (HCC) E66.01   ??? Morbid obesity with BMI of 45.0-49.9, adult (HCC) E66.01, Z68.42   ??? Hypokalemia E87.6   ??? Hypomagnesemia E83.42   ??? Syncope R55   ??? Elevated troponin R79.89   ??? 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 Hypocalcemia E83.51       REVIEW OF SYSTEMS: All Below are Negative except: See HPI     Constitutional: negative for fever, chills, and weight loss.   Cardiovascular: negative for chest pain, claudication, leg swelling, SOB, DOE   Gastrointestinal: Negative for pain, N/V/C/D, Blood in stool or urine, dysuria,  hematuria, incontinence, pelvic pain.   Musculoskeletal: See HPI    Neurological: Negative for dizziness and weakness.   Negative for headaches, Visual changes, confusion, seizures   Phychiatric/Behavioral: Negative for depression, memory loss, substance  abuse.    Extremities: Negative for hair changes, rash, or skin lesion changes.   Hematologic: Negative for bleeding problems, bruising, pallor or swollen lymph  nodes   Peripheral Vascular: No calf pain, no circulation deficits.    Social History     Socioeconomic History   ??? Marital status: SINGLE     Spouse name: Not on file   ??? Number of children: Not on file   ??? Years of education: Not on file   ??? Highest education level: Not on file   Occupational History   ??? Not on file   Social Needs   ??? Financial resource strain: Not on file   ??? Food insecurity     Worry: Not on file     Inability: Not on file   ??? Transportation needs     Medical: Not on file     Non-medical: Not on file   Tobacco Use   ??? Smoking status: Never Smoker   ??? Smokeless tobacco: Never Used   Substance and Sexual Activity   ??? Alcohol use: No     Alcohol/week: 0.0 standard drinks   ??? Drug use: No   ??? Sexual activity: Not Currently   Lifestyle   ??? Physical activity     Days per week: Not on file     Minutes per session: Not on file   ??? Stress: Not on file   Relationships   ??? Social Wellsite geologistconnections     Talks on phone: Not on file     Gets together: Not on file     Attends religious service: Not on file     Active member of club or organization: Not on file     Attends meetings of clubs or organizations: Not on file     Relationship status: Not on file   ??? Intimate partner violence     Fear of current or ex partner: Not on file     Emotionally abused: Not on file     Physically abused: Not on file     Forced sexual activity: Not on file   Other Topics Concern   ??? 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 Denies history of smoking  Allergies   Allergen Reactions   ??? Penicillins Itching    ??? Butrans [Buprenorphine] Rash   ??? Codeine Other (comments)     Nausea and abd pain   ??? Iodine Rash   ??? Lactose Diarrhea     Lactose intolerance   ??? Other Plant, Animal, Environmental Hives     Conductive electrodes        Current Outpatient Medications   Medication Sig   ??? lisinopriL (PRINIVIL, ZESTRIL) 40 mg tablet Take 1 Tab by mouth daily.   ??? calcium citrate 200 mg (950 mg) tablet Take 1 Tab by mouth daily. Indications: malabsorption   ??? multivitamin (ONE A DAY) tablet Take 1 Tab by mouth daily.   ??? thiamine HCL (B-1) 100 mg tablet Take  by mouth daily.   ??? cyanocobalamin 1,000 mcg tablet Take 1,000 mcg by mouth daily.   ??? cholecalciferol, VITAMIN D3, (VITAMIN D3) 5,000 unit tab tablet Take 5,000 Units by mouth daily.   ??? albuterol (PROVENTIL HFA, VENTOLIN HFA, PROAIR HFA) 90 mcg/actuation inhaler Take 1-2 Puffs by inhalation every four (4) hours as needed for Wheezing.   ??? nystatin (MYCOSTATIN) topical cream Apply  to affected area two (2) times a day.   ??? alcohol swabs padm Use daily to test blood sugar  DX E11.9     No current facility-administered medications for this visit.         PHYSICAL EXAMINATION:  Visit Vitals  BP 156/84   Pulse 80   Temp 97.5 ??F (36.4 ??C)   Ht 5\' 3"  (1.6 m)   Wt 192 lb 9.6 oz (87.4 kg)   BMI 34.12 kg/m??        ORTHO EXAMINATION:  Examination Left knee Right knee   Skin Intact Intact   Range of motion 95-5 95-5   Effusion - -   Medial joint line tenderness + +   Lateral joint line tenderness - -   Popliteal tenderness - -   Osteophytes palpable +, medial  +, medial   McMurray???s - -   Patella crepitus + +   Anterior drawer - -   Lateral laxity - -   Medial laxity - -   Varus deformity + +   Valgus deformity - -   Pretibial edema 2+ 2+   Calf tenderness - -     Examination Right shoulder Left shoulder   Skin Intact Intact   Effusion - -   Biceps deformity - -   Atrophy - -   AC joint tenderness - -   Acromial tenderness + +   Biceps tenderness - -    Forward flexion/Elevation ROM 175 175   Active abduction ROM 160 160   External rotation ROM 90 90   Internal rotation ROM 70 70   Apprehension - -   Impingement - -   Drop Arm Test - -   Neurovascular Intact Intact     RADIOLOGY:   XR BIL KNEE 09/03/14 VOSS  IMPRESSION:  Three views - No fractures, no effusion, bil severe end stage medial and patellofemoral joint space narrowing, + large osteophytes present. IKDC Grade D. Varus deformity    IMPRESSION:      ICD-10-CM ICD-9-CM    1. Trochanteric bursitis of right hip M70.61 726.5    2. Chronic right hip pain M25.551 719.45     G89.29 338.29    3. Chronic pain of right knee M25.561 719.46 PROCEDURE AUTHORIZATION TO SCHEDULER    G89.29 338.29  4. Primary osteoarthritis of right knee M17.11 715.16 PROCEDURE AUTHORIZATION TO SCHEDULER   5. Chronic pain of left knee M25.562 719.46 PROCEDURE AUTHORIZATION TO SCHEDULER    G89.29 338.29    6. Primary osteoarthritis of left knee M17.12 715.16 PROCEDURE AUTHORIZATION TO SCHEDULER     PLAN:   OTC analgesics for pain. There is no need for surgery at this time.  I will see her back in about one month.   Consider viscosupplementation if pain continues.  Scribed by Julianne Rice ([place]) as dictated by Delrae Sawyers, MD

## 2018-11-11 NOTE — Progress Notes (Signed)
Pt reports lateral B/L leg pain. Also c/o B/L shoulder pain and Rt hip pain. States that she fell about 2 years ago and has hip pain since then. Pt states that she's lost weight and wants to consider sx.

## 2018-11-11 NOTE — Progress Notes (Signed)
Progress Notes by Delrae Sawyers, MD at 11/11/18 1600                Author: Delrae Sawyers, MD  Service: --  Author Type: Physician       Filed: 11/11/18 1111  Encounter Date: 11/11/2018  Status: Signed          Editor: Delrae Sawyers, MD (Physician)                          Patient: Cheryl Paul                 MRN: [id number]       SSN:  [id number]   Date of Birth: Jan 01, 1951         AGE: 67 y.o.        SEX:  female         PCP: Dalene Seltzer, MD   11/11/18        Chief Complaint       Patient presents with        ?  Leg Pain             B/L        ?  Shoulder Pain             B/L        ?  Hip Pain             Rt        HISTORY:  Cheryl Paul is a  68 y.o. female who is seen for right hip and knee pain. She has been experiencing right  hip pain for the past several months. She states that she fell many years ago. She states that she aches when sleeping. She says she has knots in her right hip.She feels pain in her lateral hip with standing and walking. Her hip  stiffens up after she  sits for long periods of time.       She is also seen for bilateral knee pain. She has been experiencing worsening pain for the past few years. She notes that her knee pain has decreased  since bariatric surgery. She states previous x-rays of her knees showed severe osteoarthritis.  She states that her knees pop like popcorn.  She was previously seen at [facility] by Dr. Noberto Retort about two years ago.  She states she had previous  cortisone injections.  She has difficulty standing and sitting.  She previously saw a neurosurgereon who discussed doing an laminectomy.  She has posterior burning sensations in her knees.        She was previously seen for bilateral shoulder pain. She has shoulder pain radiating into her upper arms.        She underwent bariatric surgery on 02/22/18 by Dr. Frederico Hamman. She was 285# before surgery and now weighs 193#.      She sees Dr. Jorje Guild for pain management. She was previously  seen by Dr. Synetta Shadow but is no longer able to be seen at [facility] since she switched insurances.          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   She has two great grandchildren ages 7410 and 193.  She has 5 grandchildren. HGA1C 10.4 as of 04/06/16. [facility] but had to stop due to the Coronavirus shutdown. She states that her PT in [place] increased her knee pain.  She weighs herself every Friday. Current weight and height are 192 lbs and 5'3".         Last 3 Recorded Weights in this Encounter          11/11/18 1017        Weight:  192 lb 9.6 oz (87.4 kg)        Body mass index is 34.12 kg/m??.        Patient Active Problem List        Diagnosis  Code         ?  Chronic infection of sinus  J32.9     ?  Type 2 diabetes mellitus without complication (HCC)  E11.9     ?  Incontinence in female  R32     ?  Candidal intertrigo  B37.2     ?  Arthritis, multiple joint involvement  M12.9     ?  Essential hypertension  I10     ?  COPD (chronic obstructive pulmonary disease) (HCC)  J44.9     ?  Hyperlipidemia  E78.5     ?  Vitamin D deficiency  E55.9     ?  Internal hemorrhoids  K64.8     ?  Colon polyps  K63.5     ?  Obesity, morbid (HCC)  E66.01     ?  Morbid obesity with BMI of 45.0-49.9, adult (HCC)  E66.01, Z68.42     ?  Hypokalemia  E87.6     ?  Hypomagnesemia  E83.42     ?  Syncope  R55     ?   Elevated troponin  R79.89     ?  Short gut syndrome  K91.2         ?  Hypocalcemia  E83.51           REVIEW OF SYSTEMS: All Below are Negative  except: See HPI       Constitutional: negative for fever, chills, and weight loss.    Cardiovascular: negative for chest pain, claudication, leg swelling, SOB, DOE    Gastrointestinal: Negative for pain, N/V/C/D, Blood in stool or urine, dysuria,  hematuria, incontinence, pelvic pain.    Musculoskeletal: See HPI    Neurological: Negative for dizziness and weakness.    Negative for headaches, Visual changes, confusion, seizures    Phychiatric/Behavioral: Negative for depression, memory loss, substance  abuse.     Extremities: Negative for hair changes, rash, or skin lesion changes.    Hematologic: Negative for bleeding problems, bruising, pallor or swollen lymph  nodes    Peripheral Vascular: No calf pain, no circulation deficits.        Social History  Socioeconomic History         ?  Marital status:  SINGLE              Spouse name:  Not on file         ?  Number of children:  Not on file     ?  Years of education:  Not on file     ?  Highest education level:  Not on file       Occupational History        ?  Not on file       Social Needs         ?  Financial resource strain:  Not on file        ?  Food insecurity              Worry:  Not on file         Inability:  Not on file        ?  Transportation needs              Medical:  Not on file         Non-medical:  Not on file       Tobacco Use         ?  Smoking status:  Never Smoker     ?  Smokeless tobacco:  Never Used       Substance and Sexual Activity         ?  Alcohol use:  No              Alcohol/week:  0.0 standard drinks         ?  Drug use:  No     ?  Sexual activity:  Not Currently       Lifestyle        ?  Physical activity              Days per week:  Not on file         Minutes per session:  Not on file         ?  Stress:  Not on file       Relationships        ?  Social Engineer, manufacturing systemsconnections              Talks  on phone:  Not on file         Gets together:  Not on file         Attends religious service:  Not on file         Active member of club or organization:  Not on file         Attends meetings of clubs or organizations:  Not on file         Relationship status:  Not on file        ?  Intimate partner violence              Fear of current or ex partner:  Not on file         Emotionally abused:  Not on file         Physically abused:  Not on file         Forced sexual activity:  Not on file        Other Topics  Concern        ?  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 Denies history of smoking               Allergies        Allergen  Reactions         ?  Penicillins  Itching     ?  Butrans [Buprenorphine]  Rash     ?  Codeine  Other (comments)             Nausea and abd pain         ?  Iodine  Rash     ?  Lactose  Diarrhea             Lactose intolerance         ?  Other Plant, Educational psychologist, Environmental  Hives             Conductive electrodes              Current Outpatient Medications        Medication  Sig         ?  lisinopriL (PRINIVIL, ZESTRIL) 40 mg tablet  Take 1 Tab by mouth daily.     ?  calcium citrate 200 mg (950 mg) tablet  Take 1 Tab by mouth daily. Indications: malabsorption     ?  multivitamin (ONE A DAY) tablet  Take 1 Tab by mouth daily.     ?  thiamine HCL (B-1) 100 mg tablet  Take  by mouth daily.     ?  cyanocobalamin 1,000 mcg tablet  Take 1,000 mcg by mouth daily.     ?  cholecalciferol, VITAMIN D3, (VITAMIN D3) 5,000 unit tab tablet  Take 5,000 Units by mouth daily.     ?  albuterol (PROVENTIL HFA, VENTOLIN HFA, PROAIR HFA) 90 mcg/actuation inhaler  Take 1-2 Puffs by inhalation every four (4) hours as needed for Wheezing.     ?  nystatin (MYCOSTATIN) topical cream  Apply  to affected area two (2) times a day.         ?  alcohol swabs padm  Use daily to test blood sugar  DX E11.9          No current facility-administered  medications for this visit.             PHYSICAL EXAMINATION:   Visit Vitals      BP  156/84     Pulse  80     Temp  97.5 ??F (36.4 ??C)     Ht   (1.6 m)     Wt  192 lb 9.6 oz (87.4 kg)        BMI  34.12 kg/m??            ORTHO EXAMINATION:       Examination  Left knee  Right knee         Skin  Intact  Intact         Range of motion  95-5  95-5     Effusion  -  -     Medial joint line tenderness  +  +     Lateral joint line tenderness  -  -     Popliteal tenderness  -  -     Osteophytes palpable  +, medial   +, medial     McMurrays  -  -  Patella crepitus  +  +     Anterior drawer  -  -     Lateral laxity  -  -     Medial laxity  -  -     Varus deformity  +  +     Valgus deformity  -  -     Pretibial edema  2+  2+         Calf tenderness  -  -            Examination  Right shoulder  Left shoulder     Skin  Intact  Intact     Effusion  -  -     Biceps deformity  -  -     Atrophy  -  -     AC joint tenderness  -  -     Acromial tenderness  +  +     Biceps tenderness  -  -     Forward flexion/Elevation ROM  175  175     Active abduction ROM  160  160     External rotation ROM  90  90     Internal rotation ROM  70  70     Apprehension  -  -     Impingement  -  -     Drop Arm Test  -  -         Neurovascular  Intact  Intact        RADIOLOGY:    XR BIL KNEE 09/03/14 [facility]   IMPRESSION:  Three views - No fractures, no effusion, bil severe end stage medial and patellofemoral joint space narrowing, + large osteophytes present. IKDC Grade D. Varus deformity      IMPRESSION:               ICD-10-CM  ICD-9-CM             1.  Trochanteric bursitis of right hip  M70.61  726.5       2.  Chronic right hip pain  M25.551  719.45              G89.29  338.29             3.  Chronic pain of right knee  M25.561  719.46  PROCEDURE AUTHORIZATION TO SCHEDULER            G89.29  338.29             4.  Primary osteoarthritis of right knee  M17.11  715.16  PROCEDURE AUTHORIZATION TO SCHEDULER     5.  Chronic pain of left knee  M25.562   719.46  PROCEDURE AUTHORIZATION TO SCHEDULER            G89.29  338.29             6.  Primary osteoarthritis of left knee  M17.12  715.16  PROCEDURE AUTHORIZATION TO SCHEDULER        PLAN:   OTC analgesics for pain. There is no need for surgery at this time.  I will see  her back in about one month.   Consider viscosupplementation if pain continues.   Scribed by Huntley Decea Sanger ([place]) as dictated by Tonia BroomsSteven C. Genee Rann, MD

## 2018-11-14 ENCOUNTER — Inpatient Hospital Stay: Admit: 2018-11-14 | Payer: MEDICARE | Attending: Family Medicine | Primary: Family Medicine

## 2018-11-14 DIAGNOSIS — Z780 Asymptomatic menopausal state: Secondary | ICD-10-CM

## 2018-11-14 NOTE — Progress Notes (Signed)
My chart message sent

## 2018-11-14 NOTE — Progress Notes (Signed)
Left detailed message for patient that DEXA scan is normal and we need to schedule a 3 month MWV

## 2018-11-14 NOTE — Progress Notes (Signed)
Normal bone density scan  pls notify pt and schedule for MWV in 3 months

## 2018-11-14 NOTE — Progress Notes (Signed)
Normal bone density scan  pls notify pt and schedule for MWV in 3 months

## 2018-11-14 NOTE — Progress Notes (Signed)
Left detailed message for patient that DEXA scan is normal and we need to schedule a 3 month MWV

## 2018-12-06 ENCOUNTER — Other Ambulatory Visit: Payer: Self-pay

## 2018-12-06 DIAGNOSIS — Z20822 Contact with and (suspected) exposure to covid-19: Secondary | ICD-10-CM

## 2018-12-10 LAB — NOVEL CORONAVIRUS, NAA: SARS-CoV-2, NAA: NOT DETECTED

## 2018-12-23 ENCOUNTER — Encounter: Primary: Family Medicine

## 2019-01-09 ENCOUNTER — Encounter: Primary: Family Medicine

## 2019-01-09 ENCOUNTER — Inpatient Hospital Stay: Admit: 2019-01-09 | Payer: MEDICARE | Primary: Family Medicine

## 2019-01-09 DIAGNOSIS — E119 Type 2 diabetes mellitus without complications: Secondary | ICD-10-CM

## 2019-01-09 LAB — COMPREHENSIVE METABOLIC PANEL
ALT: 20 U/L (ref 13–56)
AST: 17 U/L (ref 10–38)
Albumin/Globulin Ratio: 1.3 (ref 0.8–1.7)
Albumin: 3.6 g/dL (ref 3.4–5.0)
Alkaline Phosphatase: 63 U/L (ref 45–117)
Anion Gap: 7 mmol/L (ref 3.0–18)
BUN: 8 MG/DL (ref 7.0–18)
Bun/Cre Ratio: 14 (ref 12–20)
CO2: 32 mmol/L (ref 21–32)
Calcium: 8.6 MG/DL (ref 8.5–10.1)
Chloride: 109 mmol/L (ref 100–111)
Creatinine: 0.57 MG/DL — ABNORMAL LOW (ref 0.6–1.3)
EGFR IF NonAfrican American: >60 mL/min/{1.73_m2} (ref >60)
GFR African American: >60 mL/min/{1.73_m2} (ref >60)
Globulin: 2.7 g/dL (ref 2.0–4.0)
Glucose: 78 mg/dL (ref 74–99)
Potassium: 3 mmol/L — ABNORMAL LOW (ref 3.5–5.5)
Sodium: 148 mmol/L — ABNORMAL HIGH (ref 136–145)
Total Bilirubin: 0.5 MG/DL (ref 0.2–1.0)
Total Protein: 6.3 g/dL — ABNORMAL LOW (ref 6.4–8.2)

## 2019-01-09 LAB — MICROALBUMIN / CREATININE URINE RATIO
Creatinine, Ur: 130 mg/dL — ABNORMAL HIGH (ref 30–125)
Microalb, Ur: 1.39 MG/DL (ref 0–3.0)
Microalbumin Creatinine Ratio: 11 mg/g (ref 0–30)

## 2019-01-09 LAB — HEMOGLOBIN A1C W/O EAG
Hemoglobin A1C: 5.4 % (ref 4.2–5.6)
Hemoglobin A1c: 5.4 % (ref 4.2–5.6)

## 2019-01-09 LAB — METABOLIC PANEL, COMPREHENSIVE
A-G Ratio: 1.3 (ref 0.8–1.7)
ALT (SGPT): 20 U/L (ref 13–56)
AST (SGOT): 17 U/L (ref 10–38)
Albumin: 3.6 g/dL (ref 3.4–5.0)
Alk. phosphatase: 63 U/L (ref 45–117)
Anion gap: 7 mmol/L (ref 3.0–18)
BUN/Creatinine ratio: 14 (ref 12–20)
BUN: 8 MG/DL (ref 7.0–18)
Bilirubin, total: 0.5 MG/DL (ref 0.2–1.0)
CO2: 32 mmol/L (ref 21–32)
Calcium: 8.6 MG/DL (ref 8.5–10.1)
Chloride: 109 mmol/L (ref 100–111)
Creatinine: 0.57 MG/DL — ABNORMAL LOW (ref 0.6–1.3)
GFR est AA: >60 mL/min/{1.73_m2} (ref >60)
GFR est non-AA: >60 mL/min/{1.73_m2} (ref >60)
Globulin: 2.7 g/dL (ref 2.0–4.0)
Glucose: 78 mg/dL (ref 74–99)
Potassium: 3 mmol/L — ABNORMAL LOW (ref 3.5–5.5)
Protein, total: 6.3 g/dL — ABNORMAL LOW (ref 6.4–8.2)
Sodium: 148 mmol/L — ABNORMAL HIGH (ref 136–145)

## 2019-01-09 LAB — MICROALBUMIN, UR, RAND W/ MICROALB/CREAT RATIO
Creatinine, urine random: 130 mg/dL — ABNORMAL HIGH (ref 30–125)
Microalbumin,urine random: 1.39 MG/DL (ref 0–3.0)
Microalbumin/Creat ratio (mg/g creat): 11 mg/g (ref 0–30)

## 2019-01-10 ENCOUNTER — Ambulatory Visit: Attending: Family Medicine | Primary: Family Medicine

## 2019-01-10 ENCOUNTER — Ambulatory Visit: Admit: 2019-01-10 | Discharge: 2019-01-10 | Payer: MEDICARE | Attending: Family Medicine | Primary: Family Medicine

## 2019-01-10 DIAGNOSIS — Z0000 Encounter for general adult medical examination without abnormal findings: Secondary | ICD-10-CM

## 2019-01-10 MED ORDER — POTASSIUM CHLORIDE SR 20 MEQ TAB, PARTICLES/CRYSTALS
20 mEq | ORAL_TABLET | Freq: Every day | ORAL | 0 refills | Status: DC
Start: 2019-01-10 — End: 2019-01-30

## 2019-01-10 NOTE — Patient Instructions (Addendum)
Medicare Wellness Visit, Female     The best way to live healthy is to have a lifestyle where you eat a well-balanced diet, exercise regularly, limit alcohol use, and quit all forms of tobacco/nicotine, if applicable.     Regular preventive services are another way to keep healthy. Preventive services (vaccines, screening tests, monitoring & exams) can help personalize your care plan, which helps you manage your own care. Screening tests can find health problems at the earliest stages, when they are easiest to treat.   [facility] follows the current, evidence-based guidelines published by the [place] [employer]ventive Services Task Force (USPSTF) when recommending preventive services for our patients. Because we follow these guidelines, sometimes recommendations change over time as research supports it. (For example, mammograms used to be recommended annually. Even though Medicare will still pay for an annual mammogram, the newer guidelines recommend a mammogram every two years for women of average risk).  Of course, you and your doctor may decide to screen more often for some diseases, based on your risk and your co-morbidities (chronic disease you are already diagnosed with).     Preventive services for you include:  - Medicare offers their members a free annual wellness visit, which is time for you and your primary care provider to discuss and plan for your preventive service needs. Take advantage of this benefit every year!  -All adults over the age of 67 should receive the recommended pneumonia vaccines. Current USPSTF guidelines recommend a series of two vaccines for the best pneumonia protection.   -All adults should have a flu vaccine yearly and a tetanus vaccine every 10 years.   -All adults age 67 and older should receive the shingles vaccines (series of two vaccines).      -All adults age 67-70 who are overweight should have a diabetes screening test once every three years.    -All adults born between [id number] and 1965 should be screened once for Hepatitis C.  -Other screening tests and preventive services for persons with diabetes include: an eye exam to screen for diabetic retinopathy, a kidney function test, a foot exam, and stricter control over your cholesterol.   -Cardiovascular screening for adults with routine risk involves an electrocardiogram (ECG) at intervals determined by your doctor.   -Colorectal cancer screenings should be done for adults age 67-75 with no increased risk factors for colorectal cancer.  There are a number of acceptable methods of screening for this type of cancer. Each test has its own benefits and drawbacks. Discuss with your doctor what is most appropriate for you during your annual wellness visit. The different tests include: colonoscopy (considered the best screening method), a fecal occult blood test, a fecal DNA test, and sigmoidoscopy.    -A bone mass density test is recommended when a woman turns 65 to screen for osteoporosis. This test is only recommended one time, as a screening. Some providers will use this same test as a disease monitoring tool if you already have osteoporosis.  -Breast cancer screenings are recommended every other year for women of normal risk, age 67-74.  -Cervical cancer screenings for women over age 67 are only recommended with certain risk factors.     Here is a list of your current Health Maintenance items (your personalized list of preventive services) with a due date:  Health Maintenance Due   Topic Date Due   ??? Shingles Vaccine (1 of 2) 10/27/2001   ??? Eye Exam  03/20/2016   ???  Albumin Urine Test  04/21/2016   ??? Diabetic Foot Care  06/19/2016   ??? Glaucoma Screening   10/27/2016   ??? Annual Well Visit  03/11/2017

## 2019-01-10 NOTE — Telephone Encounter (Signed)
ALL ANSWERS TO THE ABOVE QUESTIONS IS NO  Have you been diagnosed with, tested for, or told that you are suspected of having COVID-19 (coronovirus)?   Have you had a fever or taken medication to treat a fever in the past 72 hours?   Have you had a cough, SOB, or flu-like symptoms within the past 3 days?  Have you had direct contact with someone who tested positive for COVID-19 within the past 14 days?   Do you have a household member with flu-like symptoms, including fever, cough, or SOB?  Do you currently have flu-like symptoms including fever, cough, or SOB?

## 2019-01-10 NOTE — Progress Notes (Signed)
01/10/19:  Patient has been notified that their 9 month post op class has been canceled due to COVID-19. Patient may contact me for a 1-1 appointment if needed.    Jacqueline Browning, MS RD

## 2019-01-10 NOTE — Progress Notes (Signed)
1. Have you been to the ER, urgent care clinic since your last visit?  Hospitalized since your last visit?No    2. Have you seen or consulted any other health care providers outside of the [facility] since your last visit?  Include any pap smears or colon screening.   Dr. Madolyn Frieze ortho    This is the Subsequent Medicare Annual Wellness Exam, performed 12 months or more after the Initial AWV or the last Subsequent AWV    I have reviewed the patient's medical history in detail and updated the computerized patient record.     History     Patient Active Problem List   Diagnosis Code   ??? 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 Type 2 diabetes mellitus without complication (HCC) U04.5   ??? Incontinence in female R32   ??? Candidal intertrigo B37.2   ??? 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 Essential hypertension I10   ??? COPD (chronic obstructive pulmonary disease) (HCC) J44.9   ??? Hyperlipidemia E78.5   ??? Vitamin D deficiency E55.9   ??? Internal hemorrhoids K64.8   ??? Colon polyps K63.5   ??? Obesity, morbid (HCC) E66.01   ??? Morbid obesity with BMI of 45.0-49.9, adult (HCC) E66.01, Z68.42   ??? Hypokalemia E87.6   ??? Hypomagnesemia E83.42   ??? Syncope R55   ??? Elevated troponin R79.89   ??? 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 Hypocalcemia E83.51     Past Medical History:   Diagnosis Date   ??? Arthritis     knee, arms,    ??? Asthma    ??? Chronic obstructive pulmonary disease ([place])    ??? Chronic pain    ??? Diabetes ([place])    ??? H/O sinusitis    ??? Hypercholesterolemia    ??? Hypertension    ??? Sleep apnea     NOT USING    ??? Stress incontinence    ??? Toe fracture, left August 2015    4th toe      Past Surgical History:   Procedure Laterality Date   ??? ABDOMEN SURGERY PROC UNLISTED      4 surguries for blockages   ??? HX CARPAL TUNNEL RELEASE Bilateral    ??? HX CHOLECYSTECTOMY     ??? HX GYN     ??? HX HERNIA REPAIR      abdomen   ??? HX HYSTERECTOMY      tubes tied   ??? 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 HX ORTHOPAEDIC       catrpal tunnel right and left   ??? HX POLYPECTOMY  04/18/14     Current Outpatient Medications   Medication Sig Dispense Refill   ??? lisinopriL (PRINIVIL, ZESTRIL) 40 mg tablet Take 1 Tab by mouth daily. 90 Tab 0   ??? calcium citrate 200 mg (950 mg) tablet Take 1 Tab by mouth daily. Indications: malabsorption 30 Tab 0   ??? multivitamin (ONE A DAY) tablet Take 1 Tab by mouth daily.     ??? thiamine HCL (B-1) 100 mg tablet Take  by mouth daily.     ??? cyanocobalamin 1,000 mcg tablet Take 1,000 mcg by mouth daily.     ??? cholecalciferol, VITAMIN D3, (VITAMIN D3) 5,000 unit tab tablet Take 5,000 Units by mouth daily.     ??? nystatin (MYCOSTATIN) topical cream Apply  to affected area two (2) times a day. 15 g 1   ??? albuterol (PROVENTIL HFA, VENTOLIN  HFA, PROAIR HFA) 90 mcg/actuation inhaler Take 1-2 Puffs by inhalation every four (4) hours as needed for Wheezing. 1 Inhaler 0   ??? alcohol swabs padm Use daily to test blood sugar  DX E11.9 100 Pad 1     Allergies   Allergen Reactions   ??? Penicillins Itching   ??? Butrans [Buprenorphine] Rash   ??? Codeine Other (comments)     Nausea and abd pain   ??? Iodine Rash   ??? Lactose Diarrhea     Lactose intolerance   ??? Other Plant, Animal, Environmental Hives     Conductive electrodes       Family History   Problem Relation Age of Onset   ??? Hypertension Mother    ??? Stroke Mother    ??? Diabetes Mother    ??? Thyroid Disease Mother    ??? Arthritis-rheumatoid Mother    ??? Cancer Father         colon polpe   ??? Cancer Maternal Aunt      Social History     Tobacco Use   ??? Smoking status: Never Smoker   ??? Smokeless tobacco: Never Used   Substance Use Topics   ??? Alcohol use: No     Alcohol/week: 0.0 standard drinks       Depression Risk Factor Screening:     3 most recent PHQ Screens 01/10/2019   Little interest or pleasure in doing things Not at all   Feeling down, depressed, irritable, or hopeless Not at all   Total Score PHQ 2 0       Alcohol Risk Factor Screening:    Do you average 1 drink per night or more than 7 drinks a week:  No    On any one occasion in the past three months have you have had more than 3 drinks containing alcohol:  No      Functional Ability and Level of Safety:   Hearing: mild hearing loss per patient     Activities of Daily Living:  The home contains: no safety equipment.  Patient does total self care     Ambulation: with no difficulty     Fall Risk:  Fall Risk Assessment, last 12 mths 01/10/2019   Able to walk? Yes   Fall in past 12 months? Yes   Fall with injury? No   Number of falls in past 12 months 1   Fall Risk Score 1     Abuse Screen:  Patient is not abused       Cognitive Screening   Has your family/caregiver stated any concerns about your memory: no    Patient Care Team   Patient Care Team:  Dalene Seltzer, MD as PCP - General (Family Medicine)  Dalene Seltzer, MD as PCP - [facility] Empaneled Provider  Donavan Foil, MD (Pulmonary Disease)  Dale Durham, DO (General Surgery)  Jani Files, OD (Optometry)    Assessment/Plan   Education and counseling provided:  Are appropriate based on today's review and evaluation  End-of-Life planning (with patient's consent)- discussed, provided form  Pneumococcal Vaccine- 23 completed  Influenza Vaccine- annually  Screening Mammography- 05/2018  Colorectal cancer screening tests- 2015 update 2025  Cardiovascular screening blood test- 05/2018  Bone mass measurement (DEXA)- 10/2018    Diagnoses and all orders for this visit:    1. Medicare annual wellness visit, subsequent        Health Maintenance Due   Topic Date Due   ??? Shingrix  Vaccine Age 11> (1 of 2) 10/27/2001   ??? Eye Exam Retinal or Dilated  03/20/2016   ??? MICROALBUMIN Q1  04/21/2016   ??? Foot Exam Q1  06/19/2016   ??? GLAUCOMA SCREENING Q2Y  10/27/2016   ??? Medicare Yearly Exam  03/11/2017     Cheryl Paul, 69 y.o.,  female    SUBJECTIVE  Ff-up   HTN- long standing h/o. Says she is currently on lisinopril only.  Her BP ranges 140-150/70-80's.  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  She reports problems with hypokalemia since gastric bypass as well  ??  DM- now diet controlled, she does not check glucose at home. She is no longer on metformin.    ??  HL- says she was taken off statin recently as well.  ??  Asthma ?COPD- non smoker, denies cough, sob, wheezing. She says last albuterol use was a year ago. Usually heat triggers respiratory symptoms.    ROS:  See HPI, all others negative        Patient Active Problem List   Diagnosis Code   ??? 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 Type 2 diabetes mellitus without complication (HCC) K02.5   ??? Incontinence in female R32   ??? Candidal intertrigo B37.2   ??? 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 Essential hypertension I10   ??? COPD (chronic obstructive pulmonary disease) (HCC) J44.9   ??? Hyperlipidemia E78.5   ??? Vitamin D deficiency E55.9   ??? Internal hemorrhoids K64.8   ??? Colon polyps K63.5   ??? Obesity, morbid (HCC) E66.01   ??? Morbid obesity with BMI of 45.0-49.9, adult (HCC) E66.01, Z68.42   ??? Hypokalemia E87.6   ??? Hypomagnesemia E83.42   ??? Syncope R55   ??? Elevated troponin R79.89   ??? 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 Hypocalcemia E83.51       Current Outpatient Medications   Medication Sig Dispense Refill   ??? potassium chloride (K-DUR, KLOR-CON) 20 mEq tablet Take 2 Tabs by mouth daily. 90 Tab 0   ??? lisinopriL (PRINIVIL, ZESTRIL) 40 mg tablet Take 1 Tab by mouth daily. 90 Tab 0   ??? calcium citrate 200 mg (950 mg) tablet Take 1 Tab by mouth daily. Indications: malabsorption 30 Tab 0   ??? multivitamin (ONE A DAY) tablet Take 1 Tab by mouth daily.     ??? thiamine HCL (B-1) 100 mg tablet Take  by mouth daily.     ??? cyanocobalamin 1,000 mcg tablet Take 1,000 mcg by mouth daily.      ??? cholecalciferol, VITAMIN D3, (VITAMIN D3) 5,000 unit tab tablet Take 5,000 Units by mouth daily.     ??? nystatin (MYCOSTATIN) topical cream Apply  to affected area two (2) times a day. 15 g 1   ??? albuterol (PROVENTIL HFA, VENTOLIN HFA, PROAIR HFA) 90 mcg/actuation inhaler Take 1-2 Puffs by inhalation every four (4) hours as needed for Wheezing. 1 Inhaler 0   ??? alcohol swabs padm Use daily to test blood sugar  DX E11.9 100 Pad 1       Allergies   Allergen Reactions   ??? Penicillins Itching   ??? Butrans [Buprenorphine] Rash   ??? Codeine Other (comments)     Nausea and abd pain   ??? Iodine Rash   ??? Lactose Diarrhea     Lactose intolerance   ??? Other Plant, Higher education careers adviser, Environmental  Hives     Conductive electrodes       Past Medical History:   Diagnosis Date   ??? Arthritis     knee, arms,    ??? Asthma    ??? Chronic obstructive pulmonary disease ([place])    ??? Chronic pain    ??? Diabetes ([place])    ??? H/O sinusitis    ??? Hypercholesterolemia    ??? Hypertension    ??? Sleep apnea     NOT USING    ??? Stress incontinence    ??? Toe fracture, left August 2015    4th toe       Social History     Socioeconomic History   ??? Marital status: SINGLE     Spouse name: Not on file   ??? Number of children: Not on file   ??? Years of education: Not on file   ??? Highest education level: Not on file   Occupational History   ??? Not on file   Social Needs   ??? Financial resource strain: Not on file   ??? Food insecurity     Worry: Not on file     Inability: Not on file   ??? Transportation needs     Medical: Not on file     Non-medical: Not on file   Tobacco Use   ??? Smoking status: Never Smoker   ??? Smokeless tobacco: Never Used   Substance and Sexual Activity   ??? Alcohol use: No     Alcohol/week: 0.0 standard drinks   ??? Drug use: No   ??? Sexual activity: Not Currently   Lifestyle   ??? Physical activity     Days per week: Not on file     Minutes per session: Not on file   ??? Stress: Not on file   Relationships   ??? Social Product manager on phone: Not on file      Gets together: Not on file     Attends religious service: Not on file     Active member of club or organization: Not on file     Attends meetings of clubs or organizations: Not on file     Relationship status: Not on file   ??? Intimate partner violence     Fear of current or ex partner: Not on file     Emotionally abused: Not on file     Physically abused: Not on file     Forced sexual activity: Not on file   Other Topics Concern   ??? 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 Denies history of smoking        Family History   Problem Relation Age of Onset   ??? Hypertension Mother    ??? Stroke Mother    ??? Diabetes Mother    ??? Thyroid Disease Mother    ??? Arthritis-rheumatoid Mother    ??? Cancer Father         colon polpe   ??? Cancer Maternal Aunt          OBJECTIVE    Physical Exam:     Visit Vitals  BP (!) 138/94 (BP 1 Location: Left arm, BP Patient Position: Sitting)   Pulse 74   Temp 98.3 ??F (36.8 ??C) (Oral)   Resp 16   Ht '5\' 3"'$  (1.6 m)   Wt 183 lb (83 kg)   SpO2 98%  BMI 32.42 kg/m??       General: alert, well-appearing,obese,AA, in no apparent distress or pain  Head: atraumatic. Non-tender maxillary and frontal sinuses  Eyes: Lids with no discharge, no matting, conjunctivae clear and non injected, full EOMs, PERLLA  Ears: pinna non-tender, external auditory canal patent, TM intact  Breasts: breasts appear normal, no suspicious masses, no skin or nipple changes or axillary nodes.  Neck: supple, no adenopathy palpated  CVS: normal rate, regular rhythm, distinct S1 and S2  Lungs:clear to ausculation bilaterally, no crackles, wheezing or rhonchi noted  Abdomen: normoactive bowel sounds, soft, non-tender  Extremities: no edema, no cyanosis, MSK grossly normal  Skin: warm, no lesions, rashes noted  Psych:  mood and affect normal  Results for orders placed or performed during the hospital encounter of 01/09/19   HEMOGLOBIN A1C W/O EAG    Result Value Ref Range    Hemoglobin A1c 5.4 4.2 - 5.6 %   METABOLIC PANEL, COMPREHENSIVE   Result Value Ref Range    Sodium 148 (H) 136 - 145 mmol/L    Potassium 3.0 (L) 3.5 - 5.5 mmol/L    Chloride 109 100 - 111 mmol/L    CO2 32 21 - 32 mmol/L    Anion gap 7 3.0 - 18 mmol/L    Glucose 78 74 - 99 mg/dL    BUN 8 7.0 - 18 MG/DL    Creatinine 0.57 (L) 0.6 - 1.3 MG/DL    BUN/Creatinine ratio 14 12 - 20      GFR est AA >60 >60 ml/min/1.46m    GFR est non-AA >60 >60 ml/min/1.716m   Calcium 8.6 8.5 - 10.1 MG/DL    Bilirubin, total 0.5 0.2 - 1.0 MG/DL    ALT (SGPT) 20 13 - 56 U/L    AST (SGOT) 17 10 - 38 U/L    Alk. phosphatase 63 45 - 117 U/L    Protein, total 6.3 (L) 6.4 - 8.2 g/dL    Albumin 3.6 3.4 - 5.0 g/dL    Globulin 2.7 2.0 - 4.0 g/dL    A-G Ratio 1.3 0.8 - 1.7     MICROALBUMIN, UR, RAND W/ MICROALB/CREAT RATIO   Result Value Ref Range    Microalbumin,urine random 1.39 0 - 3.0 MG/DL    Creatinine, urine 130.00 (H) 30 - 125 mg/dL    Microalbumin/Creat ratio (mg/g creat) 11 0 - 30 mg/g           ASSESSMENT/PLAN  Diagnoses and all orders for this visit:     Hypokalemia  Start kcl 40 meqs daily  Recheck BMP next week  -     METABOLIC PANEL, BASIC; Future  -     MAGNESIUM; Future  -     ALDOSTERONE/RENIN ACTIVITY; Future     Essential hypertension  Fair control  DASH diet  Cont lisinopril  ??    Diabetes mellitus type 2, diet-controlled ([facility]irc[facility] Off meds after gastric bypass 10/19     Mild intermittent asthma, unspecified whether complicated  Controlled  Cont prn albuterol  ??   Pure hypercholesterolemia  LDL < 100 05/2018  Pt recently came off statin  Monitoring     Chronic obstructive pulmonary disease, unspecified COPD type ([place] Unclear h/o, nonsmoker  pt reports recent pulm clearance prior to gastric bypass ?[facility]  Cont prn albuterol    BMI 32  S/p gastric bypass    Other orders  -     potassium chloride (K-DUR, KLOR-CON) 20 mEq  tablet; Take 2 Tabs by mouth daily.        Follow-up and Dispositions     ?? Return in about 3 months (around 04/12/2019), or if symptoms worsen or fail to improve, for routine chronic illness care.           Patient understands plan of care. Patient has provided input and agrees with goals.

## 2019-01-10 NOTE — ACP (Advance Care Planning) (Signed)
Advance Care Planning       Advance Care Planning (ACP) Physician/NP/PA (Provider) Forde Dandy        Date of ACP Conversation: 01/10/2019    Conversation Conducted with:   [name]:    Current Designated Health Care Decision Paul:   Primary Decision Paul: [place] - Daughter - [phone]    Secondary Decision Paul: Oren Binet - Son - [phone]    Supplemental (Other) Decision Paul: Tonda, [place] - Sister - [phone]  (If there is a [place] named in the "[place]" box in the ACP activity, but it is not visible above, be sure to open that field and then select the health care decision Paul relationship (ie "primary") in the blank space to the right of the name.)    Note: Assess and validate information in current ACP documents, as indicated.       Note: If the relationship of these Decision-Makers to the patient does NOT follow your state's Next of Kin hierarchy, recommend that patient complete ACP document that meets state-specific requirements to allow them to act on the patient's behalf when appropriate.      Care Preferences:    Hospitalization:  "If your health worsens and it becomes clear that your chance of recovery is unlikely, what would your preference be regarding hospitalization?"  If the patient would want hospitalization, answer "yes". If the patient would prefer comfort-focused treatment without hospitalization, answer "no". yes      Ventilation:  "If you were in your present state of health and suddenly became very ill and were unable to breathe on your own, what would your preference be about the use of a ventilator (breathing machine) if it was available to you?"    If patient would desire the use of a ventilator (breathing machine), answer "yes", if not answer "no":yes    "If your health worsens and it becomes clear that your chance of recovery  is unlikely, what would your preference be about the use of a ventilator (breathing machine) if it was available to you?"   no      Resuscitation:  "CPR works best to restart the heart when there is a sudden event, like a heart attack, in someone who is otherwise healthy. Unfortunately, CPR does not typically restart the heart for people who have serious health conditions or who are very sick."    "In the event your heart stopped as a result of an underlying serious health condition, would you want attempts to be made to restart your heart (answer "yes" for attempt to resuscitate) or would you prefer a natural death (answer "no" for do not attempt to resuscitate)?"   no    NOTE: If the patient has a valid advance directive AND provides care preference(s) that are inconsistent with that prior directive, advise the patient to consider either: creating a new advance directive that complies with state-specific requirements; or, if that is not possible, orally revoking that prior directive in accordance with state-specific requirements, which must be documented in the EHR.    Conversation Outcomes / Follow-Up Plan:   Recommended completion of Advance Directive      Length of Voluntary ACP Conversation in minutes:  16 minutes      Dalene Seltzer, MD

## 2019-01-10 NOTE — Progress Notes (Signed)
1. Have you been to the ER, urgent care clinic since your last visit?  Hospitalized since your last visit?No    2. Have you seen or consulted any other health care providers outside of the [facility] since your last visit?  Include any pap smears or colon screening.   Dr. Madolyn Frieze ortho    This is the Subsequent Medicare Annual Wellness Exam, performed 12 months or more after the Initial AWV or the last Subsequent AWV    I have reviewed the patient's medical history in detail and updated the computerized patient record.     History     Patient Active Problem List   Diagnosis Code   ??? 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 Type 2 diabetes mellitus without complication (HCC) Z61.0   ??? Incontinence in female R32   ??? Candidal intertrigo B37.2   ??? 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 Essential hypertension I10   ??? COPD (chronic obstructive pulmonary disease) (HCC) J44.9   ??? Hyperlipidemia E78.5   ??? Vitamin D deficiency E55.9   ??? Internal hemorrhoids K64.8   ??? Colon polyps K63.5   ??? Obesity, morbid (HCC) E66.01   ??? Morbid obesity with BMI of 45.0-49.9, adult (HCC) E66.01, Z68.42   ??? Hypokalemia E87.6   ??? Hypomagnesemia E83.42   ??? Syncope R55   ??? Elevated troponin R79.89   ??? 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 Hypocalcemia E83.51     Past Medical History:   Diagnosis Date   ??? Arthritis     knee, arms,    ??? Asthma    ??? Chronic obstructive pulmonary disease ([place])    ??? Chronic pain    ??? Diabetes ([place])    ??? H/O sinusitis    ??? Hypercholesterolemia    ??? Hypertension    ??? Sleep apnea     NOT USING    ??? Stress incontinence    ??? Toe fracture, left August 2015    4th toe      Past Surgical History:   Procedure Laterality Date   ??? ABDOMEN SURGERY PROC UNLISTED      4 surguries for blockages   ??? HX CARPAL TUNNEL RELEASE Bilateral    ??? HX CHOLECYSTECTOMY     ??? HX GYN     ??? HX HERNIA REPAIR      abdomen   ??? HX HYSTERECTOMY      tubes tied   ??? 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 HX ORTHOPAEDIC      catrpal tunnel right  and left   ??? HX POLYPECTOMY  04/18/14     Current Outpatient Medications   Medication Sig Dispense Refill   ??? lisinopriL (PRINIVIL, ZESTRIL) 40 mg tablet Take 1 Tab by mouth daily. 90 Tab 0   ??? calcium citrate 200 mg (950 mg) tablet Take 1 Tab by mouth daily. Indications: malabsorption 30 Tab 0   ??? multivitamin (ONE A DAY) tablet Take 1 Tab by mouth daily.     ??? thiamine HCL (B-1) 100 mg tablet Take  by mouth daily.     ??? cyanocobalamin 1,000 mcg tablet Take 1,000 mcg by mouth daily.     ??? cholecalciferol, VITAMIN D3, (VITAMIN D3) 5,000 unit tab tablet Take 5,000 Units by mouth daily.     ??? nystatin (MYCOSTATIN) topical cream Apply  to affected area two (2) times a day. 15 g 1   ??? albuterol (PROVENTIL HFA, VENTOLIN  HFA, PROAIR HFA) 90 mcg/actuation inhaler Take 1-2 Puffs by inhalation every four (4) hours as needed for Wheezing. 1 Inhaler 0   ??? alcohol swabs padm Use daily to test blood sugar  DX E11.9 100 Pad 1     Allergies   Allergen Reactions   ??? Penicillins Itching   ??? Butrans [Buprenorphine] Rash   ??? Codeine Other (comments)     Nausea and abd pain   ??? Iodine Rash   ??? Lactose Diarrhea     Lactose intolerance   ??? Other Plant, Animal, Environmental Hives     Conductive electrodes       Family History   Problem Relation Age of Onset   ??? Hypertension Mother    ??? Stroke Mother    ??? Diabetes Mother    ??? Thyroid Disease Mother    ??? Arthritis-rheumatoid Mother    ??? Cancer Father         colon polpe   ??? Cancer Maternal Aunt      Social History     Tobacco Use   ??? Smoking status: Never Smoker   ??? Smokeless tobacco: Never Used   Substance Use Topics   ??? Alcohol use: No     Alcohol/week: 0.0 standard drinks       Depression Risk Factor Screening:     3 most recent PHQ Screens 01/10/2019   Little interest or pleasure in doing things Not at all   Feeling down, depressed, irritable, or hopeless Not at all   Total Score PHQ 2 0       Alcohol Risk Factor Screening:   Do you average 1 drink per night or more than 7 drinks a week:   No    On any one occasion in the past three months have you have had more than 3 drinks containing alcohol:  No      Functional Ability and Level of Safety:   Hearing: mild hearing loss per patient     Activities of Daily Living:  The home contains: no safety equipment.  Patient does total self care     Ambulation: with no difficulty     Fall Risk:  Fall Risk Assessment, last 12 mths 01/10/2019   Able to walk? Yes   Fall in past 12 months? Yes   Fall with injury? No   Number of falls in past 12 months 1   Fall Risk Score 1     Abuse Screen:  Patient is not abused       Cognitive Screening   Has your family/caregiver stated any concerns about your memory: no    Patient Care Team   Patient Care Team:  Dalene Seltzer, MD as PCP - General (Family Medicine)  Dalene Seltzer, MD as PCP - [facility] Empaneled Provider  Donavan Foil, MD (Pulmonary Disease)  Dale Durham, DO (General Surgery)  Jani Files, OD (Optometry)    Assessment/Plan   Education and counseling provided:  Are appropriate based on today's review and evaluation  End-of-Life planning (with patient's consent)- discussed, provided form  Pneumococcal Vaccine- 23 completed  Influenza Vaccine- annually  Screening Mammography- 05/2018  Colorectal cancer screening tests- 2015 update 2025  Cardiovascular screening blood test- 05/2018  Bone mass measurement (DEXA)- 10/2018    Diagnoses and all orders for this visit:    1. Medicare annual wellness visit, subsequent        Health Maintenance Due   Topic Date Due   ??? Shingrix  Vaccine Age 68> (1 of 2) 10/27/2001   ??? Eye Exam Retinal or Dilated  03/20/2016   ??? MICROALBUMIN Q1  04/21/2016   ??? Foot Exam Q1  06/19/2016   ??? GLAUCOMA SCREENING Q2Y  10/27/2016   ??? Medicare Yearly Exam  03/11/2017     Cheryl Paul, 68 y.o.,  female    SUBJECTIVE  Ff-up  HTN- long standing h/o. Says she is currently on lisinopril only.  Her BP ranges 140-150/70-80's.  She underwent gastric bypass 02/2018 with significant  improvement of her metabolic syndrome control and taken off multiple medications, including DM/cholesterol.  She reports problems with hypokalemia since gastric bypass as well  ??  DM- now diet controlled, she does not check glucose at home. She is no longer on metformin.    ??  HL- says she was taken off statin recently as well.  ??  Asthma ?COPD- non smoker, denies cough, sob, wheezing. She says last albuterol use was a year ago. Usually heat triggers respiratory symptoms.    ROS:  See HPI, all others negative        Patient Active Problem List   Diagnosis Code   ??? 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 Type 2 diabetes mellitus without complication (HCC) I62.7   ??? Incontinence in female R32   ??? Candidal intertrigo B37.2   ??? 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 Essential hypertension I10   ??? COPD (chronic obstructive pulmonary disease) (HCC) J44.9   ??? Hyperlipidemia E78.5   ??? Vitamin D deficiency E55.9   ??? Internal hemorrhoids K64.8   ??? Colon polyps K63.5   ??? Obesity, morbid (HCC) E66.01   ??? Morbid obesity with BMI of 45.0-49.9, adult (HCC) E66.01, Z68.42   ??? Hypokalemia E87.6   ??? Hypomagnesemia E83.42   ??? Syncope R55   ??? Elevated troponin R79.89   ??? 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 Hypocalcemia E83.51       Current Outpatient Medications   Medication Sig Dispense Refill   ??? potassium chloride (K-DUR, KLOR-CON) 20 mEq tablet Take 2 Tabs by mouth daily. 90 Tab 0   ??? lisinopriL (PRINIVIL, ZESTRIL) 40 mg tablet Take 1 Tab by mouth daily. 90 Tab 0   ??? calcium citrate 200 mg (950 mg) tablet Take 1 Tab by mouth daily. Indications: malabsorption 30 Tab 0   ??? multivitamin (ONE A DAY) tablet Take 1 Tab by mouth daily.     ??? thiamine HCL (B-1) 100 mg tablet Take  by mouth daily.     ??? cyanocobalamin 1,000 mcg tablet Take 1,000 mcg by mouth daily.     ??? cholecalciferol, VITAMIN D3, (VITAMIN D3) 5,000 unit tab tablet Take 5,000 Units by mouth daily.     ??? nystatin (MYCOSTATIN) topical cream Apply  to affected area two (2)  times a day. 15 g 1   ??? albuterol (PROVENTIL HFA, VENTOLIN HFA, PROAIR HFA) 90 mcg/actuation inhaler Take 1-2 Puffs by inhalation every four (4) hours as needed for Wheezing. 1 Inhaler 0   ??? alcohol swabs padm Use daily to test blood sugar  DX E11.9 100 Pad 1       Allergies   Allergen Reactions   ??? Penicillins Itching   ??? Butrans [Buprenorphine] Rash   ??? Codeine Other (comments)     Nausea and abd pain   ??? Iodine Rash   ??? Lactose Diarrhea     Lactose intolerance   ??? Other Plant, Higher education careers adviser, Environmental  Hives     Conductive electrodes       Past Medical History:   Diagnosis Date   ??? Arthritis     knee, arms,    ??? Asthma    ??? Chronic obstructive pulmonary disease ([place])    ??? Chronic pain    ??? Diabetes ([place])    ??? H/O sinusitis    ??? Hypercholesterolemia    ??? Hypertension    ??? Sleep apnea     NOT USING    ??? Stress incontinence    ??? Toe fracture, left August 2015    4th toe       Social History     Socioeconomic History   ??? Marital status: SINGLE     Spouse name: Not on file   ??? Number of children: Not on file   ??? Years of education: Not on file   ??? Highest education level: Not on file   Occupational History   ??? Not on file   Social Needs   ??? Financial resource strain: Not on file   ??? Food insecurity     Worry: Not on file     Inability: Not on file   ??? Transportation needs     Medical: Not on file     Non-medical: Not on file   Tobacco Use   ??? Smoking status: Never Smoker   ??? Smokeless tobacco: Never Used   Substance and Sexual Activity   ??? Alcohol use: No     Alcohol/week: 0.0 standard drinks   ??? Drug use: No   ??? Sexual activity: Not Currently   Lifestyle   ??? Physical activity     Days per week: Not on file     Minutes per session: Not on file   ??? Stress: Not on file   Relationships   ??? Social Product manager on phone: Not on file     Gets together: Not on file     Attends religious service: Not on file     Active member of club or organization: Not on file     Attends meetings of clubs or organizations: Not on file      Relationship status: Not on file   ??? Intimate partner violence     Fear of current or ex partner: Not on file     Emotionally abused: Not on file     Physically abused: Not on file     Forced sexual activity: Not on file   Other Topics Concern   ??? 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 Denies history of smoking        Family History   Problem Relation Age of Onset   ??? Hypertension Mother    ??? Stroke Mother    ??? Diabetes Mother    ??? Thyroid Disease Mother    ??? Arthritis-rheumatoid Mother    ??? Cancer Father         colon polpe   ??? Cancer Maternal Aunt          OBJECTIVE    Physical Exam:     Visit Vitals  BP (!) 138/94 (BP 1 Location: Left arm, BP Patient Position: Sitting)   Pulse 74   Temp 98.3 ??F (36.8 ??C) (Oral)   Resp 16   Ht 5' 3"  (1.6 m)   Wt 183 lb (83 kg)   SpO2 98%  BMI 32.42 kg/m??       General: alert, well-appearing,obese,AA, in no apparent distress or pain  Head: atraumatic. Non-tender maxillary and frontal sinuses  Eyes: Lids with no discharge, no matting, conjunctivae clear and non injected, full EOMs, PERLLA  Ears: pinna non-tender, external auditory canal patent, TM intact  Breasts: breasts appear normal, no suspicious masses, no skin or nipple changes or axillary nodes.  Neck: supple, no adenopathy palpated  CVS: normal rate, regular rhythm, distinct S1 and S2  Lungs:clear to ausculation bilaterally, no crackles, wheezing or rhonchi noted  Abdomen: normoactive bowel sounds, soft, non-tender  Extremities: no edema, no cyanosis, MSK grossly normal  Skin: warm, no lesions, rashes noted  Psych:  mood and affect normal  Results for orders placed or performed during the hospital encounter of 01/09/19   HEMOGLOBIN A1C W/O EAG   Result Value Ref Range    Hemoglobin A1c 5.4 4.2 - 5.6 %   METABOLIC PANEL, COMPREHENSIVE   Result Value Ref Range    Sodium 148 (H) 136 - 145 mmol/L    Potassium 3.0 (L) 3.5 - 5.5 mmol/L    Chloride 109 100  - 111 mmol/L    CO2 32 21 - 32 mmol/L    Anion gap 7 3.0 - 18 mmol/L    Glucose 78 74 - 99 mg/dL    BUN 8 7.0 - 18 MG/DL    Creatinine 0.57 (L) 0.6 - 1.3 MG/DL    BUN/Creatinine ratio 14 12 - 20      GFR est AA >60 >60 ml/min/1.35m    GFR est non-AA >60 >60 ml/min/1.749m   Calcium 8.6 8.5 - 10.1 MG/DL    Bilirubin, total 0.5 0.2 - 1.0 MG/DL    ALT (SGPT) 20 13 - 56 U/L    AST (SGOT) 17 10 - 38 U/L    Alk. phosphatase 63 45 - 117 U/L    Protein, total 6.3 (L) 6.4 - 8.2 g/dL    Albumin 3.6 3.4 - 5.0 g/dL    Globulin 2.7 2.0 - 4.0 g/dL    A-G Ratio 1.3 0.8 - 1.7     MICROALBUMIN, UR, RAND W/ MICROALB/CREAT RATIO   Result Value Ref Range    Microalbumin,urine random 1.39 0 - 3.0 MG/DL    Creatinine, urine 130.00 (H) 30 - 125 mg/dL    Microalbumin/Creat ratio (mg/g creat) 11 0 - 30 mg/g           ASSESSMENT/PLAN  Diagnoses and all orders for this visit:     Hypokalemia  Start kcl 40 meqs daily  Recheck BMP next week  -     METABOLIC PANEL, BASIC; Future  -     MAGNESIUM; Future  -     ALDOSTERONE/RENIN ACTIVITY; Future     Essential hypertension  Fair control  DASH diet  Cont lisinopril  ??    Diabetes mellitus type 2, diet-controlled ([facility] Off meds after gastric bypass 10/19     Mild intermittent asthma, unspecified whether complicated  Controlled  Cont prn albuterol  ??   Pure hypercholesterolemia  LDL < 100 05/2018  Pt recently came off statin  Monitoring     Chronic obstructive pulmonary disease, unspecified COPD type ([place] Unclear h/o, nonsmoker  pt reports recent pulm clearance prior to gastric bypass ?[facility]  Cont prn albuterol    BMI 32  S/p gastric bypass    Other orders  -     potassium chloride (K-DUR, KLOR-CON) 20 mEq  tablet; Take 2 Tabs by mouth daily.        Follow-up and Dispositions    ?? Return in about 3 months (around 04/12/2019), or if symptoms worsen or fail to improve, for routine chronic illness care.           Patient understands plan of care. Patient has provided input and agrees with goals.

## 2019-01-10 NOTE — ACP (Advance Care Planning) (Signed)
Advance Care Planning       Advance Care Planning (ACP) Physician/NP/PA (Provider) Forde Dandy        Date of ACP Conversation: 01/10/2019    Conversation Conducted with:   Patient with [place] Maker:    Current Designated Health Care Decision Maker:   Primary Decision Maker: [place] - Daughter - [phone]    Secondary Decision Maker: Oren Binet - Son - [phone]    Supplemental (Other) Decision Maker: [place], [place] - Sister - [phone]  (If there is a [place] named in the "[place]" box in the ACP activity, but it is not visible above, be sure to open that field and then select the health care decision maker relationship (ie "primary") in the blank space to the right of the name.)    Note: Assess and validate information in current ACP documents, as indicated.       Note: If the relationship of these Decision-Makers to the patient does NOT follow your state's Next of Kin hierarchy, recommend that patient complete ACP document that meets state-specific requirements to allow them to act on the patient's behalf when appropriate.      Care Preferences:    Hospitalization:  "If your health worsens and it becomes clear that your chance of recovery is unlikely, what would your preference be regarding hospitalization?"  If the patient would want hospitalization, answer "yes". If the patient would prefer comfort-focused treatment without hospitalization, answer "no". yes      Ventilation:  "If you were in your present state of health and suddenly became very ill and were unable to breathe on your own, what would your preference be about the use of a ventilator (breathing machine) if it was available to you?"    If patient would desire the use of a ventilator (breathing machine), answer "yes", if not answer "no":yes    "If your health worsens and it becomes clear that your chance of recovery is unlikely, what would your  preference be about the use of a ventilator (breathing machine) if it was available to you?"   no      Resuscitation:  "CPR works best to restart the heart when there is a sudden event, like a heart attack, in someone who is otherwise healthy. Unfortunately, CPR does not typically restart the heart for people who have serious health conditions or who are very sick."    "In the event your heart stopped as a result of an underlying serious health condition, would you want attempts to be made to restart your heart (answer "yes" for attempt to resuscitate) or would you prefer a natural death (answer "no" for do not attempt to resuscitate)?"   no    NOTE: If the patient has a valid advance directive AND provides care preference(s) that are inconsistent with that prior directive, advise the patient to consider either: creating a new advance directive that complies with state-specific requirements; or, if that is not possible, orally revoking that prior directive in accordance with state-specific requirements, which must be documented in the EHR.    Conversation Outcomes / Follow-Up Plan:   Recommended completion of Advance Directive      Length of Voluntary ACP Conversation in minutes:  16 minutes      Dalene Seltzer, MD

## 2019-01-17 ENCOUNTER — Inpatient Hospital Stay: Admit: 2019-01-17 | Payer: MEDICARE | Primary: Family Medicine

## 2019-01-17 DIAGNOSIS — E876 Hypokalemia: Secondary | ICD-10-CM

## 2019-01-17 LAB — BASIC METABOLIC PANEL
Anion Gap: 6 mmol/L (ref 3.0–18)
BUN: 9 MG/DL (ref 7.0–18)
Bun/Cre Ratio: 17 (ref 12–20)
CO2: 31 mmol/L (ref 21–32)
Calcium: 8.9 MG/DL (ref 8.5–10.1)
Chloride: 108 mmol/L (ref 100–111)
Creatinine: 0.52 MG/DL — ABNORMAL LOW (ref 0.6–1.3)
EGFR IF NonAfrican American: >60 mL/min/{1.73_m2} (ref >60)
GFR African American: >60 mL/min/{1.73_m2} (ref >60)
Glucose: 88 mg/dL (ref 74–99)
Potassium: 3.3 mmol/L — ABNORMAL LOW (ref 3.5–5.5)
Sodium: 145 mmol/L (ref 136–145)

## 2019-01-17 LAB — MAGNESIUM
Magnesium: 1.6 mg/dL (ref 1.6–2.6)
Magnesium: 1.6 mg/dL (ref 1.6–2.6)

## 2019-01-17 LAB — METABOLIC PANEL, BASIC
Anion gap: 6 mmol/L (ref 3.0–18)
BUN/Creatinine ratio: 17 (ref 12–20)
BUN: 9 MG/DL (ref 7.0–18)
CO2: 31 mmol/L (ref 21–32)
Calcium: 8.9 MG/DL (ref 8.5–10.1)
Chloride: 108 mmol/L (ref 100–111)
Creatinine: 0.52 MG/DL — ABNORMAL LOW (ref 0.6–1.3)
GFR est AA: >60 mL/min/{1.73_m2} (ref >60)
GFR est non-AA: >60 mL/min/{1.73_m2} (ref >60)
Glucose: 88 mg/dL (ref 74–99)
Potassium: 3.3 mmol/L — ABNORMAL LOW (ref 3.5–5.5)
Sodium: 145 mmol/L (ref 136–145)

## 2019-01-17 NOTE — Progress Notes (Signed)
Patient identified with 2 identifiers (name and DOB).   Patient aware of     Potassium level is still low, will increase KCL to 60 meqs daily (3 tabs of 20 meqs)   Also aldosterone ( one of the hormone checks that help regulate potassium) ??may be abnormal, and would recommend evaluation by endocrinologist, referral placed

## 2019-01-17 NOTE — Progress Notes (Signed)
Potassium level is still low, will increase KCL to 60 meqs daily (3 tabs of 20 meqs)  Also aldosterone ( one of the hormone checks that help regulate potassium)  may be abnormal, and would recommend evaluation by endocrinologist, referral placed  pls notify pt. Recheck BMP in 2 weeks

## 2019-01-17 NOTE — Progress Notes (Signed)
Called patient mail box is full

## 2019-01-17 NOTE — Progress Notes (Signed)
 Patient identified with 2 identifiers (name and DOB).   Patient aware of     Potassium level is still low, will increase KCL to 60 meqs daily (3 tabs of 20 meqs)   Also aldosterone ( one of the hormone checks that help regulate potassium) may be abnormal, and would recommend evaluation by endocrinologist, referral placed

## 2019-01-17 NOTE — Progress Notes (Signed)
Potassium level is still low, will increase KCL to 60 meqs daily (3 tabs of 20 meqs)  Also aldosterone ( one of the hormone checks that help regulate potassium)  may be abnormal, and would recommend evaluation by endocrinologist, referral placed  pls notify pt. Recheck BMP in 2 weeks

## 2019-01-20 ENCOUNTER — Encounter: Payer: MEDICARE | Primary: Family Medicine

## 2019-01-25 ENCOUNTER — Encounter: Attending: Family Medicine | Primary: Family Medicine

## 2019-01-27 LAB — ALDOSTERONE/RENIN ACTIVITY
Aldosterone: 3.1 ng/dL (ref 0.0–30.0)
Aldosterone: 3.1 ng/dL (ref 0.0–30.0)
Renin Activity: <0.167 ng/mL/hr — ABNORMAL LOW (ref 0.167–5.380)
Renin Activity: <0.167 ng/mL/hr — ABNORMAL LOW (ref 0.167–5.380)

## 2019-01-30 ENCOUNTER — Encounter

## 2019-01-30 MED ORDER — POTASSIUM CHLORIDE SR 20 MEQ TAB, PARTICLES/CRYSTALS
20 mEq | ORAL_TABLET | Freq: Every day | ORAL | 0 refills | Status: DC
Start: 2019-01-30 — End: 2019-03-23

## 2019-01-31 NOTE — Progress Notes (Signed)
Per MBSAQIP requirements;  E-mail and letter sent for follow up appointment.    [facility]  [address]  [place] [address]         [facility]   [facility]  [facility]        Dear Patient,        Your health is our main concern. It is important for your health to have follow-up lab work and to see your surgeon at 3 months, 6 months and annually after your weight loss surgery.          Additionally, the Department of Bariatric Surgery at our hospital is a member of the American College of Surgeons??? Bariatric National Surgical Quality Improvement Program (ACS NSQIP).   As a participant in this program, we gather information on the outcomes of our patients after surgery.    Please call the office for a follow up appointment at [phone].       If you have moved out of the area or have changed surgeons please call us and let us know the name of your doctor.    Your health and feedback are important to us.  We greatly appreciate your response.       Thank you,  [facility]  [place]

## 2019-01-31 NOTE — Progress Notes (Signed)
Progress Notes by Ancil Linsey, RN at 01/31/19 1016                Author: Ancil Linsey, RN  Service: --  Author Type: Navigator       Filed: 01/31/19 1016  Encounter Date: 01/31/2019  Status: Signed          Editor: Ancil Linsey, RN (Navigator)               Per [facility] requirements;   E-mail and letter sent for follow up appointment.      [facility]   [address] [place]. [facility]    [facility]   [facility]            Dear Patient,          Your health is our main concern. It is important for your health to have follow-up lab work and to see your surgeon at 3 months, 6 months and annually after your weight loss surgery.           Additionally, the Department of Bariatric Surgery at our hospital is a member of the [employer] of Surgeons Bariatric National Surgical Quality Improvement Program (ACS NSQIP).   As a participant in this program, we gather information  on the outcomes of our patients after surgery.      Please call the office for a follow up appointment at [phone].         If you have moved out of the area or have changed surgeons please call us and let us know the name of your doctor.      Your health and feedback are important to [place].  We greatly appreciate your response.          Thank you,   [facility] [place]

## 2019-02-02 MED ORDER — LISINOPRIL 40 MG TAB
40 mg | ORAL_TABLET | ORAL | 0 refills | Status: DC
Start: 2019-02-02 — End: 2019-07-27

## 2019-03-17 NOTE — Telephone Encounter (Signed)
Patient contact office and indicated that yesterday evening, she was out of town visiting family. She was on the phone and was being asked some questions and was having a difficult time getting her words out clear. She said that she told the person she would call her back. She stated she was talking to another family member who indicated to her that her speech was "a little slurred". Patient denies CP, Jaw pain, numbness or tingling of any extremity, arm pain. Patient states it lasted no more than 10 minutes. She said that she relaxed herself and it resolved. I advised patient that I would send a message to Dr. Dawayne Cirri for recommendations. Patient advised if symptoms return to be seen immediately via 911 or personal vehicle.

## 2019-03-23 MED ORDER — POTASSIUM CHLORIDE SR 20 MEQ TAB, PARTICLES/CRYSTALS
20 mEq | ORAL_TABLET | Freq: Every day | ORAL | 0 refills | Status: DC
Start: 2019-03-23 — End: 2019-04-17

## 2019-03-23 NOTE — Telephone Encounter (Signed)
Due for recheck potassium, pls remind pt. Did she get evaluated by endo already?

## 2019-03-30 ENCOUNTER — Inpatient Hospital Stay: Admit: 2019-03-30 | Payer: MEDICARE | Primary: Family Medicine

## 2019-03-30 NOTE — Progress Notes (Signed)
[facility] [place], [address]      Patient's Name: Cheryl Paul   Age: 68 y.o.  Date of Birth: [birth year]   Sex: female    Date:   03/30/2019    Height: 5 f 3 Weight:    177      Lbs.   BMI: 31.4     Surgery Date: 02/21/18    Starting Weight: 273   Last Recorded Weight:   Overall Pounds Lost: 96     Procedure: Gastric Bypass    Lowest Weight patient has achieved since surgery: Current    How long has patient been at this weight for: Patient states she is still losing.    Other Pertinent Information:     Diet History (reported by patient on diet history form)    Do you smoke: None    Alcohol Intake: None    Meals per day: At least 3.  She states she was previously just eating 1 meal.      Diet History:  10:30 am:  Patient states she may have a scrambled egg.  She states sometimes she may have a piece of bread with this.  She states if she has some ham she may eat this.    She states she may not eat anything again until 5:00 pm:  She states she may have some chicken and some vegetables.  She states she eats a lot of vegetables. She states she runs to the bathroom every time she eats, but upon diet history, it is the carbohydrates that is causing her to go to the bathroom.     Portion sizes ~: She states she is eating the size of her palm.  She states she will get restriction from this.      Simple sugar intake: Patient states she started eating sweets at Halloween time.  Patient will state, "it's just a small amount."  Patient states she never stopped using sugar and still uses real sugar because she doesn't like artificial sweeteners.     Experiencing dumping syndrome: Yes.  She states dumping syndrome from this.    Carbohydrate intake: Yes    Symptoms that occur when carbohydrates are consumed: She is feeling slugging and very tired from eating carbohydrates.    Ounces of fluid consumed per day: 24 ounces      Fluids being consumed: water    Patient is not drinking protein drinks    Exercise History    Patient is doing more walking for exercise.  She is counting going to the grocery store as her activity.  I have talked to her about establishing an exercise routine outside of her normal living.    Vitamins    Patient is taking 2 Multivitamins per day    Patient is taking Calcium in the form ofchewable.  Patient is taking 1500 mg per day.    Patient is taking 1000 mcg of Vitamin B12.      Patient is taking 5000 IU of Vitamin D 3 today.      Patient is taking 100 mg of Vitamin B1.    Summary:  Patient is 1 year post op.  Her weight loss is good; however, her diet habits are not.  She is eating sugar and states she never stopped.  She is eating carbohydrates and complains of going to the bathroom frequently afterwards.  I talked to her that what she  is experiencing is dumping syndrome.  I talked to her about the effects sugar has, but patient got defensive and stated she is only eating a small amount.  I talked to her about reactive hypoglycemia and the sluggish feeling she is eating is from the drop in her blood sugar.  Patient states she thinks she feels that way from not eating enough.  I discussed with patient that she could be having drops if she is going long periods without food or if she is eating carbohydrates.  I have challenged her to stick to protein, vegetables, and get some carbohydrates in the form or beans, fruit, yogurt.  Patient states she will try, but will not promise that she is going to give up carbohydrates or sugar.  She states she regrets having the surgery.  She states that our office called to schedule a year visit, but she doesn't understand why she needs to still see Dr. Karleen Hampshire just for him to "ask a bunch of questions." I talked with patient about the importance of follow up.  She states she is following up with her PCP.     She is taking all her vitamins as instructed.  She is doing minimal  activity. I feel a lot of her the symptoms she is experiencing is because of what she is eating.  I have reinforced the diet guidelines with her and made alternative food recommendations.  Patient didn't seem open to the changes I have suggested.  Will follow up.      Other Pertinent Information:       Adela Ports, MS RD  03/30/2019

## 2019-03-30 NOTE — Progress Notes (Signed)
 [facility]  [address] [facility], [address]      Patient's Name: Cheryl Paul   Age: 68 y.o.  Date of Birth: June 22, 1950   Sex: female    Date:   03/30/2019    Height: 5 f 3 Weight:    177      Lbs.   BMI: 31.4     Surgery Date: 02/21/18    Starting Weight: 273   Last Recorded Weight:   Overall Pounds Lost: 96     Procedure: Gastric Bypass    Lowest Weight patient has achieved since surgery: Current    How long has patient been at this weight for: Patient states she is still losing.    Other Pertinent Information:     Diet History (reported by patient on diet history form)    Do you smoke: None    Alcohol Intake: None    Meals per day: At least 3.  She states she was previously just eating 1 meal.      Diet History:  10:30 am:  Patient states she may have a scrambled egg.  She states sometimes she may have a piece of bread with this.  She states if she has some ham she may eat this.    She states she may not eat anything again until 5:00 pm:  She states she may have some chicken and some vegetables.  She states she eats a lot of vegetables. She states she runs to the bathroom every time she eats, but upon diet history, it is the carbohydrates that is causing her to go to the bathroom.     Portion sizes ~: She states she is eating the size of her palm.  She states she will get restriction from this.      Simple sugar intake: Patient states she started eating sweets at Halloween time.  Patient will state, it's just a small amount.  Patient states she never stopped using sugar and still uses real sugar because she doesn't like artificial sweeteners.     Experiencing dumping syndrome: Yes.  She states dumping syndrome from this.    Carbohydrate intake: Yes    Symptoms that occur when carbohydrates are consumed: She is feeling slugging and very tired from eating carbohydrates.    Ounces of fluid consumed per day: 24 ounces     Fluids being consumed:  water    Patient is not drinking protein drinks    Exercise History    Patient is doing more walking for exercise.  She is counting going to the grocery store as her activity.  I have talked to her about establishing an exercise routine outside of her normal living.    Vitamins    Patient is taking 2 Multivitamins per day    Patient is taking Calcium  in the form ofchewable.  Patient is taking 1500 mg per day.    Patient is taking 1000 mcg of Vitamin B12.      Patient is taking 5000 IU of Vitamin D  3 today.      Patient is taking 100 mg of Vitamin B1.    Summary:  Patient is 1 year post op.  Her weight loss is good; however, her diet habits are not.  She is eating sugar and states she never stopped.  She is eating carbohydrates and complains of going to the bathroom frequently afterwards.  I talked to her that what she  is experiencing is dumping syndrome.  I talked to her about the effects sugar has, but patient got defensive and stated she is only eating a small amount.  I talked to her about reactive hypoglycemia and the sluggish feeling she is eating is from the drop in her blood sugar.  Patient states she thinks she feels that way from not eating enough.  I discussed with patient that she could be having drops if she is going long periods without food or if she is eating carbohydrates.  I have challenged her to stick to protein, vegetables, and get some carbohydrates in the form or beans, fruit, yogurt.  Patient states she will try, but will not promise that she is going to give up carbohydrates or sugar.  She states she regrets having the surgery.  She states that our office called to schedule a year visit, but she doesn't understand why she needs to still see Dr. Jacques just for him to ask a bunch of questions. I talked with patient about the importance of follow up.  She states she is following up with her PCP.     She is taking all her vitamins as instructed.  She is doing minimal activity. I feel a lot of  her the symptoms she is experiencing is because of what she is eating.  I have reinforced the diet guidelines with her and made alternative food recommendations.  Patient didn't seem open to the changes I have suggested.  Will follow up.      Other Pertinent Information:       Arlyne Schlossman, MS RD  03/30/2019

## 2019-04-14 ENCOUNTER — Encounter: Attending: Family Medicine | Primary: Family Medicine

## 2019-04-17 MED ORDER — KLOR-CON M20 MEQ TABLET,EXTENDED RELEASE
20 mEq | ORAL_TABLET | ORAL | 0 refills | Status: DC
Start: 2019-04-17 — End: 2019-05-22

## 2019-04-17 NOTE — Telephone Encounter (Signed)
Due for ff-up jan, and remind on labs soon.

## 2019-04-20 ENCOUNTER — Encounter: Payer: MEDICARE | Attending: Family Medicine | Primary: Family Medicine

## 2019-05-04 NOTE — Telephone Encounter (Signed)
[facility] [phone] (home) advising patient to schedule Jan f/u and that she will be due for labs prior.

## 2019-05-04 NOTE — Telephone Encounter (Addendum)
Pt made appt in jan and advised of labs due prior. Pt voiced understanding. Closing encounter

## 2019-05-09 ENCOUNTER — Inpatient Hospital Stay: Admit: 2019-05-09 | Payer: MEDICARE | Primary: Family Medicine

## 2019-05-09 DIAGNOSIS — E876 Hypokalemia: Secondary | ICD-10-CM

## 2019-05-09 LAB — BASIC METABOLIC PANEL
Anion Gap: 5 mmol/L (ref 3.0–18)
BUN: 9 MG/DL (ref 7.0–18)
Bun/Cre Ratio: 16 (ref 12–20)
CO2: 31 mmol/L (ref 21–32)
Calcium: 8.5 MG/DL (ref 8.5–10.1)
Chloride: 112 mmol/L — ABNORMAL HIGH (ref 100–111)
Creatinine: 0.56 MG/DL — ABNORMAL LOW (ref 0.6–1.3)
EGFR IF NonAfrican American: >60 mL/min/{1.73_m2} (ref >60)
GFR African American: >60 mL/min/{1.73_m2} (ref >60)
Glucose: 80 mg/dL (ref 74–99)
Potassium: 3.6 mmol/L (ref 3.5–5.5)
Sodium: 148 mmol/L — ABNORMAL HIGH (ref 136–145)

## 2019-05-09 LAB — METABOLIC PANEL, BASIC
Anion gap: 5 mmol/L (ref 3.0–18)
BUN/Creatinine ratio: 16 (ref 12–20)
BUN: 9 MG/DL (ref 7.0–18)
CO2: 31 mmol/L (ref 21–32)
Calcium: 8.5 MG/DL (ref 8.5–10.1)
Chloride: 112 mmol/L — ABNORMAL HIGH (ref 100–111)
Creatinine: 0.56 MG/DL — ABNORMAL LOW (ref 0.6–1.3)
GFR est AA: >60 mL/min/{1.73_m2} (ref >60)
GFR est non-AA: >60 mL/min/{1.73_m2} (ref >60)
Glucose: 80 mg/dL (ref 74–99)
Potassium: 3.6 mmol/L (ref 3.5–5.5)
Sodium: 148 mmol/L — ABNORMAL HIGH (ref 136–145)

## 2019-05-09 NOTE — Progress Notes (Signed)
To be discussed on visit 1/5

## 2019-05-15 ENCOUNTER — Emergency Department: Admit: 2019-05-16 | Payer: MEDICARE | Primary: Family Medicine

## 2019-05-15 ENCOUNTER — Inpatient Hospital Stay
Admit: 2019-05-15 | Discharge: 2019-05-16 | Disposition: A | Discharge status: Home / Self Care | Payer: MEDICARE | Attending: Emergency Medicine

## 2019-05-15 DIAGNOSIS — M19012 Primary osteoarthritis, left shoulder: Secondary | ICD-10-CM

## 2019-05-15 NOTE — ED Notes (Signed)
Written and verbal discharge instructions given. Patient verbalizes understanding of same. Patient denies  further questions about treatment and discharge instructions. Left ED with patent airway and steady gait. Arm band removed shredded. Patient left ED with RX    COVID WARNINGS GIVEN AND  IMPORTANCE OF FOLLOWING GUIDELINES STRESSED

## 2019-05-15 NOTE — ED Notes (Signed)
Written and verbal discharge instructions given. Patient verbalizes understanding of same. Patient denies  further questions about treatment and discharge instructions. Left ED with patent airway and steady gait. Arm band removed shredded.    COVID WARNINGS GIVEN AND IMPORTANCE OF FOLLOWING GUIDELINES STRESSED-

## 2019-05-15 NOTE — ED Provider Notes (Signed)
HPI Patient is a 68 yo female who presents to the ER with c/o pain to her left upper arm/shoulder.  She has had this pain that has been intermittent for 3 months.  Yesterday her left shoulder pain got worse then normal.  She states  that sometimes she uses a Lidoderm patch that sometimes give her relief.   She describes the pain as a "nerve pain".    She denies resent  Injury of her chest.  She also has a hx of chronic arthritis pain.    Past Medical History:   Diagnosis Date   ??? Arthritis     knee, arms,    ??? Asthma    ??? Chronic obstructive pulmonary disease (HCC)    ??? Chronic pain    ??? Diabetes (HCC)    ??? H/O sinusitis    ??? Hypercholesterolemia    ??? Hypertension    ??? Sleep apnea     NOT USING    ??? Stress incontinence    ??? Toe fracture, left August 2015    4th toe       Past Surgical History:   Procedure Laterality Date   ??? ABDOMEN SURGERY PROC UNLISTED      4 surguries for blockages   ??? HX CARPAL TUNNEL RELEASE Bilateral    ??? HX CHOLECYSTECTOMY     ??? HX GYN     ??? HX HERNIA REPAIR      abdomen   ??? HX HYSTERECTOMY      tubes tied   ??? 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 HX ORTHOPAEDIC      catrpal tunnel right and left   ??? HX POLYPECTOMY  04/18/14         Family History:   Problem Relation Age of Onset   ??? Hypertension Mother    ??? Stroke Mother    ??? Diabetes Mother    ??? Thyroid Disease Mother    ??? Arthritis-rheumatoid Mother    ??? Cancer Father         colon polpe   ??? Cancer Maternal Aunt        Social History     Socioeconomic History   ??? Marital status: SINGLE     Spouse name: Not on file   ??? Number of children: Not on file   ??? Years of education: Not on file   ??? Highest education level: Not on file   Occupational History   ??? Not on file   Social Needs   ??? Financial resource strain: Not on file   ??? Food insecurity     Worry: Not on file     Inability: Not on file   ??? Transportation needs     Medical: Not on file     Non-medical: Not on file   Tobacco Use   ??? Smoking status: Never Smoker    ??? Smokeless tobacco: Never Used   Substance and Sexual Activity   ??? Alcohol use: No     Alcohol/week: 0.0 standard drinks   ??? Drug use: No   ??? Sexual activity: Not Currently   Lifestyle   ??? Physical activity     Days per week: Not on file     Minutes per session: Not on file   ??? Stress: Not on file   Relationships   ??? Social Wellsite geologistconnections     Talks on phone: Not on file     Gets together: Not on file  Attends religious service: Not on file     Active member of club or organization: Not on file     Attends meetings of clubs or organizations: Not on file     Relationship status: Not on file   ??? Intimate partner violence     Fear of current or ex partner: Not on file     Emotionally abused: Not on file     Physically abused: Not on file     Forced sexual activity: Not on file   Other Topics Concern   ??? 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 Denies history of smoking          ALLERGIES: Penicillins; Butrans [buprenorphine]; Codeine; Iodine; Lactose; and Other plant, animal, environmental    Review of Systems   Constitutional: Negative.    HENT: Negative.    Eyes: Negative.    Respiratory: Negative.    Cardiovascular: Negative.    Gastrointestinal: Negative.    Endocrine: Negative.    Genitourinary: Negative.    Musculoskeletal: Positive for arthralgias (left shoulder pain).   Skin: Negative.    Allergic/Immunologic: Negative.    Neurological: Negative.    Hematological: Negative.    Psychiatric/Behavioral: Negative.    All other systems reviewed and are negative.      Vitals:    05/15/19 1759   BP: (!) 150/74   Pulse: 63   Resp: 16   Temp: 98.6 ??F (37 ??C)   SpO2: 99%            Physical Exam  Vitals signs and nursing note reviewed.   Constitutional:       General: She is not in acute distress.     Appearance: She is well-developed. She is not diaphoretic.   HENT:      Head: Normocephalic.      Right Ear: External ear normal.       Left Ear: External ear normal.      Mouth/Throat:      Pharynx: No oropharyngeal exudate.   Eyes:      General: No scleral icterus.        Right eye: No discharge.         Left eye: No discharge.      Conjunctiva/sclera: Conjunctivae normal.      Pupils: Pupils are equal, round, and reactive to light.   Neck:      Musculoskeletal: Normal range of motion and neck supple.      Thyroid: No thyromegaly.      Vascular: No JVD.      Trachea: No tracheal deviation.   Cardiovascular:      Rate and Rhythm: Normal rate and regular rhythm.      Heart sounds: Normal heart sounds. No murmur. No friction rub. No gallop.    Pulmonary:      Effort: Pulmonary effort is normal. No respiratory distress.      Breath sounds: Normal breath sounds. No stridor. No wheezing or rales.   Chest:      Chest wall: No tenderness.   Abdominal:      General: Bowel sounds are normal. There is no distension.      Palpations: Abdomen is soft. There is no mass.      Tenderness: There is no abdominal tenderness. There is no guarding or rebound.   Musculoskeletal: Normal range of motion.         General: No tenderness.  Comments: LEFT SHOULDER: (+) moderate pain with ROM, normal pulses, sensory and strength.   Lymphadenopathy:      Cervical: No cervical adenopathy.   Skin:     General: Skin is warm and dry.      Coloration: Skin is not pale.      Findings: No erythema or rash.   Neurological:      Mental Status: She is alert and oriented to person, place, and time.      Cranial Nerves: No cranial nerve deficit.      Motor: No abnormal muscle tone.      Coordination: Coordination normal.      Deep Tendon Reflexes: Reflexes normal.          MDM  ED Course as of May 14 2201   Mon May 15, 2019   2201 XR SHOULDER LT AP/LAT MIN 2 V [KB]      ED Course User Index  [KB] Glean Salvo, MD       Procedures    Dx: arthritis left shoulder    Disp:  D/C home.  F/U PCP in 3 days.  Rx: motrin.  Return to ER prn.     Dictation disclaimer:  Please note that this dictation was completed with Dragon, the computer voice recognition software.  Quite often unanticipated grammatical, syntax, homophones, and other interpretive errors are inadvertently transcribed by the computer software.  Please disregard these errors.  Please excuse any errors that have escaped final proofreading.

## 2019-05-15 NOTE — ED Triage Notes (Signed)
Patient c/o pain to left upper arm that started yesterday.  She states its like a "nerve pain".  She states she has had this pain before.  She denies injury.

## 2019-05-15 NOTE — ED Notes (Signed)
Assumed care after arrival to [facility]

## 2019-05-15 NOTE — ED Notes (Signed)
Written and verbal discharge instructions given. Patient verbalizes understanding of same. Patient denies  further questions about treatment and discharge instructions. Left ED with patent airway and steady gait. Arm band removed shredded.    COVID WARNINGS GIVEN AND IMPORTANCE OF FOLLOWING GUIDELINES STRESSED-

## 2019-05-15 NOTE — ED Provider Notes (Signed)
HPI Patient is a 68 yo female who presents to the ER with c/o pain to her left upper arm/shoulder.  She has had this pain that has been intermittent for 3 months.  Yesterday her left shoulder pain got worse then normal.  She states  that sometimes she uses a Lidoderm patch that sometimes give her relief.   She describes the pain as a "nerve pain".    She denies resent  Injury of her chest.  She also has a hx of chronic arthritis pain.    Past Medical History:   Diagnosis Date   ??? Arthritis     knee, arms,    ??? Asthma    ??? Chronic obstructive pulmonary disease ([place])    ??? Chronic pain    ??? Diabetes ([place])    ??? H/O sinusitis    ??? Hypercholesterolemia    ??? Hypertension    ??? Sleep apnea     NOT USING    ??? Stress incontinence    ??? Toe fracture, left August 2015    4th toe       Past Surgical History:   Procedure Laterality Date   ??? ABDOMEN SURGERY PROC UNLISTED      4 surguries for blockages   ??? HX CARPAL TUNNEL RELEASE Bilateral    ??? HX CHOLECYSTECTOMY     ??? HX GYN     ??? HX HERNIA REPAIR      abdomen   ??? HX HYSTERECTOMY      tubes tied   ??? 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 HX ORTHOPAEDIC      catrpal tunnel right and left   ??? HX POLYPECTOMY  04/18/14         Family History:   Problem Relation Age of Onset   ??? Hypertension Mother    ??? Stroke Mother    ??? Diabetes Mother    ??? Thyroid Disease Mother    ??? Arthritis-rheumatoid Mother    ??? Cancer Father         colon polpe   ??? Cancer Maternal Aunt        Social History     Socioeconomic History   ??? Marital status: SINGLE     Spouse name: Not on file   ??? Number of children: Not on file   ??? Years of education: Not on file   ??? Highest education level: Not on file   Occupational History   ??? Not on file   Social Needs   ??? Financial resource strain: Not on file   ??? Food insecurity     Worry: Not on file     Inability: Not on file   ??? Transportation needs     Medical: Not on file     Non-medical: Not on file   Tobacco Use   ??? Smoking status: Never Smoker   ??? Smokeless tobacco: Never  Used   Substance and Sexual Activity   ??? Alcohol use: No     Alcohol/week: 0.0 standard drinks   ??? Drug use: No   ??? Sexual activity: Not Currently   Lifestyle   ??? Physical activity     Days per week: Not on file     Minutes per session: Not on file   ??? Stress: Not on file   Relationships   ??? Social Product manager on phone: Not on file     Gets together: Not on file  Attends religious service: Not on file     Active member of club or organization: Not on file     Attends meetings of clubs or organizations: Not on file     Relationship status: Not on file   ??? Intimate partner violence     Fear of current or ex partner: Not on file     Emotionally abused: Not on file     Physically abused: Not on file     Forced sexual activity: Not on file   Other Topics Concern   ??? 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 Denies history of smoking          ALLERGIES: Penicillins; Butrans [buprenorphine]; Codeine; Iodine; Lactose; and Other plant, animal, environmental    Review of Systems   Constitutional: Negative.    HENT: Negative.    Eyes: Negative.    Respiratory: Negative.    Cardiovascular: Negative.    Gastrointestinal: Negative.    Endocrine: Negative.    Genitourinary: Negative.    Musculoskeletal: Positive for arthralgias (left shoulder pain).   Skin: Negative.    Allergic/Immunologic: Negative.    Neurological: Negative.    Hematological: Negative.    Psychiatric/Behavioral: Negative.    All other systems reviewed and are negative.      Vitals:    05/15/19 1759   BP: (!) 150/74   Pulse: 63   Resp: 16   Temp: 98.6 ??F (37 ??C)   SpO2: 99%            Physical Exam  Vitals signs and nursing note reviewed.   Constitutional:       General: She is not in acute distress.     Appearance: She is well-developed. She is not diaphoretic.   HENT:      Head: Normocephalic.      Right Ear: External ear normal.      Left Ear: External ear normal.       Mouth/Throat:      Pharynx: No oropharyngeal exudate.   Eyes:      General: No scleral icterus.        Right eye: No discharge.         Left eye: No discharge.      Conjunctiva/sclera: Conjunctivae normal.      Pupils: Pupils are equal, round, and reactive to light.   Neck:      Musculoskeletal: Normal range of motion and neck supple.      Thyroid: No thyromegaly.      Vascular: No JVD.      Trachea: No tracheal deviation.   Cardiovascular:      Rate and Rhythm: Normal rate and regular rhythm.      Heart sounds: Normal heart sounds. No murmur. No friction rub. No gallop.    Pulmonary:      Effort: Pulmonary effort is normal. No respiratory distress.      Breath sounds: Normal breath sounds. No stridor. No wheezing or rales.   Chest:      Chest wall: No tenderness.   Abdominal:      General: Bowel sounds are normal. There is no distension.      Palpations: Abdomen is soft. There is no mass.      Tenderness: There is no abdominal tenderness. There is no guarding or rebound.   Musculoskeletal: Normal range of motion.         General: No tenderness.  Comments: LEFT SHOULDER: (+) moderate pain with ROM, normal pulses, sensory and strength.   Lymphadenopathy:      Cervical: No cervical adenopathy.   Skin:     General: Skin is warm and dry.      Coloration: Skin is not pale.      Findings: No erythema or rash.   Neurological:      Mental Status: She is alert and oriented to person, place, and time.      Cranial Nerves: No cranial nerve deficit.      Motor: No abnormal muscle tone.      Coordination: Coordination normal.      Deep Tendon Reflexes: Reflexes normal.          MDM  ED Course as of May 14 2201   Mon May 15, 2019   2201 XR SHOULDER LT AP/LAT MIN 2 V [KB]      ED Course User Index  [KB] Sherren Kerns, MD       Procedures    Dx: arthritis left shoulder    Disp:  D/C home.  F/U PCP in 3 days.  Rx: motrin.  Return to ER prn.    Dictation disclaimer:  Please note that this dictation was completed with Dragon,  the computer voice recognition software.  Quite often unanticipated grammatical, syntax, homophones, and other interpretive errors are inadvertently transcribed by the computer software.  Please disregard these errors.  Please excuse any errors that have escaped final proofreading.

## 2019-05-15 NOTE — ED Notes (Signed)
Written and verbal discharge instructions given. Patient verbalizes understanding of same. Patient denies  further questions about treatment and discharge instructions. Left ED with patent airway and steady gait. Arm band removed shredded. Patient left ED with RX    COVID WARNINGS GIVEN AND  IMPORTANCE OF FOLLOWING GUIDELINES STRESSED

## 2019-05-15 NOTE — ED Notes (Signed)
 Patient c/o pain to left upper arm that started yesterday.  She states its like a nerve pain.  She states she has had this pain before.  She denies injury.

## 2019-05-16 MED ORDER — MELOXICAM 7.5 MG TAB
7.5 mg | ORAL_TABLET | Freq: Every day | ORAL | 0 refills | Status: DC
Start: 2019-05-16 — End: 2020-03-04

## 2019-05-16 MED ORDER — KETOROLAC TROMETHAMINE 15 MG/ML INJECTION
15 mg/mL | INTRAMUSCULAR | Status: AC
Start: 2019-05-16 — End: 2019-05-15
  Administered 2019-05-16: 03:00:00 via INTRAMUSCULAR

## 2019-05-16 MED FILL — KETOROLAC TROMETHAMINE 15 MG/ML INJECTION: 15 mg/mL | INTRAMUSCULAR | Qty: 1

## 2019-05-16 NOTE — Progress Notes (Signed)
Date/Time:  05/16/2019 9:18 AM   Call within 2 business days of discharge: Yes   Attempted to reach patient by telephone. Left HIPPA compliant message requesting a return call. Will attempt to reach patient again.

## 2019-05-16 NOTE — Progress Notes (Signed)
Date/Time:  05/16/2019 9:18 AM   Call within 2 business days of discharge: Yes   Attempted to reach patient by telephone. Left HIPPA compliant message requesting a return call. Will attempt to reach patient again.

## 2019-05-18 ENCOUNTER — Encounter

## 2019-05-22 MED ORDER — KLOR-CON M20 MEQ TABLET,EXTENDED RELEASE
20 mEq | ORAL_TABLET | ORAL | 0 refills | Status: DC
Start: 2019-05-22 — End: 2020-03-04

## 2019-05-23 ENCOUNTER — Ambulatory Visit: Attending: Family Medicine | Primary: Family Medicine

## 2019-05-23 ENCOUNTER — Ambulatory Visit: Admit: 2019-05-23 | Discharge: 2019-05-23 | Payer: MEDICARE | Attending: Family Medicine | Primary: Family Medicine

## 2019-05-23 DIAGNOSIS — I10 Essential (primary) hypertension: Secondary | ICD-10-CM

## 2019-05-23 NOTE — Progress Notes (Signed)
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  She reports problems with hypokalemia since gastric bypass as well. Currently on 60 meqs kcl daily, K wnl 04/2019  ??  DM-diet controlled, she does not check glucose at home. She is no longer on metformin.    ??  HL- says she was taken off statin recently as well.  ??  Asthma ?COPD- non smoker, denies cough, sob, wheezing. She says last albuterol use was a year ago. Usually heat triggers respiratory symptoms.    Shoulder pain from ED visit 04/2019 has resolved, responded to meloxicam  ROS:  See HPI, all others negative        Patient Active Problem List   Diagnosis Code   ??? 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 Type 2 diabetes mellitus without complication (HCC) C16.6   ??? Incontinence in female R32   ??? Candidal intertrigo B37.2   ??? 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 Essential hypertension I10   ??? COPD (chronic obstructive pulmonary disease) (HCC) J44.9   ??? Hyperlipidemia E78.5   ??? Vitamin D deficiency E55.9   ??? Internal hemorrhoids K64.8   ??? Colon polyps K63.5   ??? Obesity, morbid (HCC) E66.01   ??? Morbid obesity with BMI of 45.0-49.9, adult (HCC) E66.01, Z68.42   ??? Hypokalemia E87.6   ??? Hypomagnesemia E83.42   ??? Syncope R55   ??? Elevated troponin R77.8   ??? 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 Hypocalcemia E83.51   ??? Mild intermittent asthma J45.20       Current Outpatient Medications   Medication Sig Dispense Refill   ??? Klor-Con M20 20 mEq tablet TAKE 3 TABLETS BY MOUTH EVERY DAY 90 Tab 0   ??? meloxicam (Mobic) 7.5 mg tablet Take 1 Tab by mouth daily. 14 Tab 0   ??? lisinopriL (PRINIVIL, ZESTRIL) 40 mg tablet TAKE 1 TABLET BY MOUTH EVERY DAY 90 Tab 0   ??? calcium citrate 200 mg (950 mg) tablet Take 1 Tab by mouth daily. Indications: malabsorption 30 Tab 0   ??? multivitamin (ONE A DAY) tablet Take 1  Tab by mouth daily.     ??? thiamine HCL (B-1) 100 mg tablet Take  by mouth daily.     ??? cyanocobalamin 1,000 mcg tablet Take 1,000 mcg by mouth daily.     ??? cholecalciferol, VITAMIN D3, (VITAMIN D3) 5,000 unit tab tablet Take 5,000 Units by mouth daily.     ??? nystatin (MYCOSTATIN) topical cream Apply  to affected area two (2) times a day. 15 g 1   ??? albuterol (PROVENTIL HFA, VENTOLIN HFA, PROAIR HFA) 90 mcg/actuation inhaler Take 1-2 Puffs by inhalation every four (4) hours as needed for Wheezing. 1 Inhaler 0   ??? alcohol swabs padm Use daily to test blood sugar  DX E11.9 100 Pad 1       Allergies   Allergen Reactions   ??? Penicillins Itching   ??? Butrans [Buprenorphine] Rash   ??? Codeine Other (comments)     Nausea and abd pain   ??? Iodine Rash   ??? Lactose Diarrhea     Lactose intolerance   ??? Other Plant, Animal, Environmental Hives     Conductive electrodes       Past Medical History:   Diagnosis Date   ??? Arthritis  knee, arms,    ??? Asthma    ??? Chronic obstructive pulmonary disease (HCC)    ??? Chronic pain    ??? Diabetes (HCC)    ??? H/O sinusitis    ??? Hypercholesterolemia    ??? Hypertension    ??? Sleep apnea     NOT USING    ??? Stress incontinence    ??? Toe fracture, left August 2015    4th toe       Social History     Socioeconomic History   ??? Marital status: SINGLE     Spouse name: Not on file   ??? Number of children: Not on file   ??? Years of education: Not on file   ??? Highest education level: Not on file   Occupational History   ??? Not on file   Social Needs   ??? Financial resource strain: Not on file   ??? Food insecurity     Worry: Not on file     Inability: Not on file   ??? Transportation needs     Medical: Not on file     Non-medical: Not on file   Tobacco Use   ??? Smoking status: Never Smoker   ??? Smokeless tobacco: Never Used   Substance and Sexual Activity   ??? Alcohol use: No     Alcohol/week: 0.0 standard drinks   ??? Drug use: No   ??? Sexual activity: Not Currently   Lifestyle   ??? Physical activity     Days per week: Not  on file     Minutes per session: Not on file   ??? Stress: Not on file   Relationships   ??? Social connections     Talks on phone: Not on file     Gets together: Not on file     Attends religious service: Not on file     Active member of club or organization: Not on file     Attends meetings of clubs or organizations: Not on file     Relationship status: Not on file   ??? Intimate partner violence     Fear of current or ex partner: Not on file     Emotionally abused: Not on file     Physically abused: Not on file     Forced sexual activity: Not on file   Other Topics Concern   ??? 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 Denies history of smoking        Family History   Problem Relation Age of Onset   ??? Hypertension Mother    ??? Stroke Mother    ??? Diabetes Mother    ??? Thyroid Disease Mother    ??? Arthritis-rheumatoid Mother    ??? Cancer Father         colon polpe   ??? Cancer Maternal Aunt          OBJECTIVE    Physical Exam:     Visit Vitals  BP 132/80 (BP 1 Location: Left arm, BP Patient Position: Sitting)   Pulse 68   Temp 98.7 ??F (37.1 ??C) (Oral)   Resp 16   Ht 5' 3" (1.6 m)   Wt 178 lb (80.7 kg)   SpO2 98%   BMI 31.53 kg/m??       General: alert, well-appearing,obese,AA, in no apparent distress or pain  Neck: supple, no adenopathy palpated  CVS: normal rate,   regular rhythm, distinct S1 and S2  Lungs:clear to ausculation bilaterally, no crackles, wheezing or rhonchi noted  Abdomen: normoactive bowel sounds, soft, non-tender  Extremities: no edema, no cyanosis, MSK grossly normal  Feet: no lesions, normal monofilament  Skin: warm, no lesions, rashes noted  Psych:  mood and affect normal  Results for orders placed or performed during the hospital encounter of 05/09/19   METABOLIC PANEL, BASIC   Result Value Ref Range    Sodium 148 (H) 136 - 145 mmol/L    Potassium 3.6 3.5 - 5.5 mmol/L    Chloride 112 (H) 100 - 111 mmol/L    CO2 31 21 - 32 mmol/L    Anion gap  5 3.0 - 18 mmol/L    Glucose 80 74 - 99 mg/dL    BUN 9 7.0 - 18 MG/DL    Creatinine 8.85 (L) 0.6 - 1.3 MG/DL    BUN/Creatinine ratio 16 12 - 20      GFR est AA >60 >60 ml/min/1.39m2    GFR est non-AA >60 >60 ml/min/1.47m2    Calcium 8.5 8.5 - 10.1 MG/DL           ASSESSMENT/PLAN  Diagnoses and all orders for this visit:     Essential hypertension  controlled  DASH diet  Cont lisinopril  ??  Diabetes mellitus type 2, diet-controlled (HCC)  Off meds after gastric bypass 10/19  Check a1c, cmp, vitamin D level prior to next visit  Foot exam today 05/2019     Mild intermittent asthma, unspecified whether complicated  Controlled  Cont prn albuterol  ??flu vaccine given     Pure hypercholesterolemia  LDL < 100 05/2018, update lipids prior to next visit  Pt recently came off statin  Monitoring     Chronic obstructive pulmonary disease, unspecified COPD type (HCC)  Unclear h/o, nonsmoker  pt reports recent pulm clearance prior to gastric bypass ?[facility]  Cont prn albuterol    BMI 31  S/p gastric bypass    Hypokalemia  Now wnl  Cont kcl 60 meqs daily  monitoring  -     potassium chloride (K-DUR, KLOR-CON) 20 mEq tablet; Take 3 Tabs by mouth daily.    Vitamin D deficiency  Cont 5000 iu daily  Recheck level prior to next visit    Encounter for vaccine  High dose flu vaccine given    Follow-up and Dispositions    ?? Return in about 3 months (around 08/21/2019), or if symptoms worsen or fail to improve, for routine chronic illness care, fasting labs a week prior to your next visit.           Patient understands plan of care. Patient has provided input and agrees with goals.

## 2019-05-23 NOTE — Progress Notes (Signed)
1. Have you been to the ER, urgent care clinic since your last visit?  Hospitalized since your last visit? Yes [facility] 05/15/2019 arthritis of left shoulder    2. Have you seen or consulted any other health care providers outside of the [facility] since your last visit?  Include any pap smears or colon screening. No   Fluad 0.5 ml given IM in left deltoid. Lot # [id number], exp date 10/31/2019. Patient tolerated injection well. No adverse reaction noted.

## 2019-05-23 NOTE — Progress Notes (Signed)
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  She reports problems with hypokalemia since gastric bypass as well. Currently on 60 meqs kcl daily, K wnl 04/2019  ??  DM-diet controlled, she does not check glucose at home. She is no longer on metformin.    ??  HL- says she was taken off statin recently as well.  ??  Asthma ?COPD- non smoker, denies cough, sob, wheezing. She says last albuterol use was a year ago. Usually heat triggers respiratory symptoms.    Shoulder pain from ED visit 04/2019 has resolved, responded to meloxicam  ROS:  See HPI, all others negative        Patient Active Problem List   Diagnosis Code   ??? 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 Type 2 diabetes mellitus without complication (HCC) C16.6   ??? Incontinence in female R32   ??? Candidal intertrigo B37.2   ??? 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 Essential hypertension I10   ??? COPD (chronic obstructive pulmonary disease) (HCC) J44.9   ??? Hyperlipidemia E78.5   ??? Vitamin D deficiency E55.9   ??? Internal hemorrhoids K64.8   ??? Colon polyps K63.5   ??? Obesity, morbid (HCC) E66.01   ??? Morbid obesity with BMI of 45.0-49.9, adult (HCC) E66.01, Z68.42   ??? Hypokalemia E87.6   ??? Hypomagnesemia E83.42   ??? Syncope R55   ??? Elevated troponin R77.8   ??? 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 Hypocalcemia E83.51   ??? Mild intermittent asthma J45.20       Current Outpatient Medications   Medication Sig Dispense Refill   ??? Klor-Con M20 20 mEq tablet TAKE 3 TABLETS BY MOUTH EVERY DAY 90 Tab 0   ??? meloxicam (Mobic) 7.5 mg tablet Take 1 Tab by mouth daily. 14 Tab 0   ??? lisinopriL (PRINIVIL, ZESTRIL) 40 mg tablet TAKE 1 TABLET BY MOUTH EVERY DAY 90 Tab 0   ??? calcium citrate 200 mg (950 mg) tablet Take 1 Tab by mouth daily. Indications: malabsorption 30 Tab 0   ??? multivitamin (ONE A DAY) tablet Take 1  Tab by mouth daily.     ??? thiamine HCL (B-1) 100 mg tablet Take  by mouth daily.     ??? cyanocobalamin 1,000 mcg tablet Take 1,000 mcg by mouth daily.     ??? cholecalciferol, VITAMIN D3, (VITAMIN D3) 5,000 unit tab tablet Take 5,000 Units by mouth daily.     ??? nystatin (MYCOSTATIN) topical cream Apply  to affected area two (2) times a day. 15 g 1   ??? albuterol (PROVENTIL HFA, VENTOLIN HFA, PROAIR HFA) 90 mcg/actuation inhaler Take 1-2 Puffs by inhalation every four (4) hours as needed for Wheezing. 1 Inhaler 0   ??? alcohol swabs padm Use daily to test blood sugar  DX E11.9 100 Pad 1       Allergies   Allergen Reactions   ??? Penicillins Itching   ??? Butrans [Buprenorphine] Rash   ??? Codeine Other (comments)     Nausea and abd pain   ??? Iodine Rash   ??? Lactose Diarrhea     Lactose intolerance   ??? Other Plant, Animal, Environmental Hives     Conductive electrodes       Past Medical History:   Diagnosis Date   ??? Arthritis  knee, arms,    ??? Asthma    ??? Chronic obstructive pulmonary disease (HCC)    ??? Chronic pain    ??? Diabetes (HCC)    ??? H/O sinusitis    ??? Hypercholesterolemia    ??? Hypertension    ??? Sleep apnea     NOT USING    ??? Stress incontinence    ??? Toe fracture, left August 2015    4th toe       Social History     Socioeconomic History   ??? Marital status: SINGLE     Spouse name: Not on file   ??? Number of children: Not on file   ??? Years of education: Not on file   ??? Highest education level: Not on file   Occupational History   ??? Not on file   Social Needs   ??? Financial resource strain: Not on file   ??? Food insecurity     Worry: Not on file     Inability: Not on file   ??? Transportation needs     Medical: Not on file     Non-medical: Not on file   Tobacco Use   ??? Smoking status: Never Smoker   ??? Smokeless tobacco: Never Used   Substance and Sexual Activity   ??? Alcohol use: No     Alcohol/week: 0.0 standard drinks   ??? Drug use: No   ??? Sexual activity: Not Currently   Lifestyle   ??? Physical activity     Days per week: Not  on file     Minutes per session: Not on file   ??? Stress: Not on file   Relationships   ??? Social Wellsite geologist on phone: Not on file     Gets together: Not on file     Attends religious service: Not on file     Active member of club or organization: Not on file     Attends meetings of clubs or organizations: Not on file     Relationship status: Not on file   ??? Intimate partner violence     Fear of current or ex partner: Not on file     Emotionally abused: Not on file     Physically abused: Not on file     Forced sexual activity: Not on file   Other Topics Concern   ??? 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 Denies history of smoking        Family History   Problem Relation Age of Onset   ??? Hypertension Mother    ??? Stroke Mother    ??? Diabetes Mother    ??? Thyroid Disease Mother    ??? Arthritis-rheumatoid Mother    ??? Cancer Father         colon polpe   ??? Cancer Maternal Aunt          OBJECTIVE    Physical Exam:     Visit Vitals  BP 132/80 (BP 1 Location: Left arm, BP Patient Position: Sitting)   Pulse 68   Temp 98.7 ??F (37.1 ??C) (Oral)   Resp 16   Ht 5\' 3"  (1.6 m)   Wt 178 lb (80.7 kg)   SpO2 98%   BMI 31.53 kg/m??       General: alert, well-appearing,obese,AA, in no apparent distress or pain  Neck: supple, no adenopathy palpated  CVS: normal rate,  regular rhythm, distinct S1 and S2  Lungs:clear to ausculation bilaterally, no crackles, wheezing or rhonchi noted  Abdomen: normoactive bowel sounds, soft, non-tender  Extremities: no edema, no cyanosis, MSK grossly normal  Feet: no lesions, normal monofilament  Skin: warm, no lesions, rashes noted  Psych:  mood and affect normal  Results for orders placed or performed during the hospital encounter of 05/09/19   METABOLIC PANEL, BASIC   Result Value Ref Range    Sodium 148 (H) 136 - 145 mmol/L    Potassium 3.6 3.5 - 5.5 mmol/L    Chloride 112 (H) 100 - 111 mmol/L    CO2 31 21 - 32 mmol/L    Anion gap  5 3.0 - 18 mmol/L    Glucose 80 74 - 99 mg/dL    BUN 9 7.0 - 18 MG/DL    Creatinine 8.85 (L) 0.6 - 1.3 MG/DL    BUN/Creatinine ratio 16 12 - 20      GFR est AA >60 >60 ml/min/1.39m2    GFR est non-AA >60 >60 ml/min/1.47m2    Calcium 8.5 8.5 - 10.1 MG/DL           ASSESSMENT/PLAN  Diagnoses and all orders for this visit:     Essential hypertension  controlled  DASH diet  Cont lisinopril  ??  Diabetes mellitus type 2, diet-controlled (HCC)  Off meds after gastric bypass 10/19  Check a1c, cmp, vitamin D level prior to next visit  Foot exam today 05/2019     Mild intermittent asthma, unspecified whether complicated  Controlled  Cont prn albuterol  ??flu vaccine given     Pure hypercholesterolemia  LDL < 100 05/2018, update lipids prior to next visit  Pt recently came off statin  Monitoring     Chronic obstructive pulmonary disease, unspecified COPD type (HCC)  Unclear h/o, nonsmoker  pt reports recent pulm clearance prior to gastric bypass ?[facility]  Cont prn albuterol    BMI 31  S/p gastric bypass    Hypokalemia  Now wnl  Cont kcl 60 meqs daily  monitoring  -     potassium chloride (K-DUR, KLOR-CON) 20 mEq tablet; Take 3 Tabs by mouth daily.    Vitamin D deficiency  Cont 5000 iu daily  Recheck level prior to next visit    Encounter for vaccine  High dose flu vaccine given    Follow-up and Dispositions    ?? Return in about 3 months (around 08/21/2019), or if symptoms worsen or fail to improve, for routine chronic illness care, fasting labs a week prior to your next visit.           Patient understands plan of care. Patient has provided input and agrees with goals.

## 2019-05-23 NOTE — Progress Notes (Signed)
1. Have you been to the ER, urgent care clinic since your last visit?  Hospitalized since your last visit? Yes [facility] 05/15/2019 arthritis of left shoulder    2. Have you seen or consulted any other health care providers outside of the [facility] since your last visit?  Include any pap smears or colon screening. No   Fluad 0.5 ml given IM in left deltoid. Lot # [id number], exp date 10/31/2019. Patient tolerated injection well. No adverse reaction noted.

## 2019-07-01 ENCOUNTER — Ambulatory Visit: Payer: Medicare HMO

## 2019-07-22 ENCOUNTER — Inpatient Hospital Stay: Admit: 2019-07-22 | Payer: MEDICARE | Attending: Family Medicine | Primary: Family Medicine

## 2019-07-22 DIAGNOSIS — Z1231 Encounter for screening mammogram for malignant neoplasm of breast: Secondary | ICD-10-CM

## 2019-07-27 MED ORDER — LISINOPRIL 40 MG TAB
40 mg | ORAL_TABLET | ORAL | 0 refills | Status: DC
Start: 2019-07-27 — End: 2019-08-01

## 2019-07-31 NOTE — Telephone Encounter (Signed)
Pt called and stated she has been felling faintly and she checked her bs and it was 91. Pt stated she hasn't had any bs issues due to she has had the gastrobypass. Pt would like to know what was her normal rate before the bypass. Please call pt at your earliest convenience.

## 2019-08-01 ENCOUNTER — Inpatient Hospital Stay: Admit: 2019-08-01 | Payer: MEDICARE | Primary: Family Medicine

## 2019-08-01 ENCOUNTER — Encounter

## 2019-08-01 DIAGNOSIS — E119 Type 2 diabetes mellitus without complications: Secondary | ICD-10-CM

## 2019-08-01 LAB — LIPID PANEL
CHOL/HDL Ratio: 2.2 (ref 0–5.0)
Chol/HDL Ratio: 2.2 (ref 0–5.0)
Cholesterol, Total: 155 MG/DL (ref <200)
Cholesterol, total: 155 MG/DL (ref <200)
HDL Cholesterol: 71 MG/DL — ABNORMAL HIGH (ref 40–60)
HDL: 71 MG/DL — ABNORMAL HIGH (ref 40–60)
LDL Calculated: 68.6 MG/DL (ref 0–100)
LDL, calculated: 68.6 MG/DL (ref 0–100)
Triglyceride: 77 MG/DL (ref <150)
Triglycerides: 77 MG/DL (ref <150)
VLDL Cholesterol Calculated: 15.4 MG/DL
VLDL, calculated: 15.4 MG/DL

## 2019-08-01 LAB — COMPREHENSIVE METABOLIC PANEL
ALT: 41 U/L (ref 13–56)
AST: 21 U/L (ref 10–38)
Albumin/Globulin Ratio: 1.3 (ref 0.8–1.7)
Albumin: 3.6 g/dL (ref 3.4–5.0)
Alkaline Phosphatase: 84 U/L (ref 45–117)
Anion Gap: 5 mmol/L (ref 3.0–18)
BUN: 10 MG/DL (ref 7.0–18)
Bun/Cre Ratio: 22 — ABNORMAL HIGH (ref 12–20)
CO2: 34 mmol/L — ABNORMAL HIGH (ref 21–32)
Calcium: 8.3 MG/DL — ABNORMAL LOW (ref 8.5–10.1)
Chloride: 108 mmol/L (ref 100–111)
Creatinine: 0.46 MG/DL — ABNORMAL LOW (ref 0.6–1.3)
EGFR IF NonAfrican American: >60 mL/min/{1.73_m2} (ref >60)
GFR African American: >60 mL/min/{1.73_m2} (ref >60)
Globulin: 2.7 g/dL (ref 2.0–4.0)
Glucose: 84 mg/dL (ref 74–99)
Potassium: 3.1 mmol/L — ABNORMAL LOW (ref 3.5–5.5)
Sodium: 147 mmol/L — ABNORMAL HIGH (ref 136–145)
Total Bilirubin: 0.5 MG/DL (ref 0.2–1.0)
Total Protein: 6.3 g/dL — ABNORMAL LOW (ref 6.4–8.2)

## 2019-08-01 LAB — HEMOGLOBIN A1C W/EAG
Hemoglobin A1C: 5.5 % (ref 4.2–5.6)
eAG: 111 mg/dL

## 2019-08-01 LAB — VITAMIN D 25 HYDROXY: Vit D, 25-Hydroxy: 21.4 ng/mL — ABNORMAL LOW (ref 30–100)

## 2019-08-01 LAB — METABOLIC PANEL, COMPREHENSIVE
A-G Ratio: 1.3 (ref 0.8–1.7)
ALT (SGPT): 41 U/L (ref 13–56)
AST (SGOT): 21 U/L (ref 10–38)
Albumin: 3.6 g/dL (ref 3.4–5.0)
Alk. phosphatase: 84 U/L (ref 45–117)
Anion gap: 5 mmol/L (ref 3.0–18)
BUN/Creatinine ratio: 22 — ABNORMAL HIGH (ref 12–20)
BUN: 10 MG/DL (ref 7.0–18)
Bilirubin, total: 0.5 MG/DL (ref 0.2–1.0)
CO2: 34 mmol/L — ABNORMAL HIGH (ref 21–32)
Calcium: 8.3 MG/DL — ABNORMAL LOW (ref 8.5–10.1)
Chloride: 108 mmol/L (ref 100–111)
Creatinine: 0.46 MG/DL — ABNORMAL LOW (ref 0.6–1.3)
GFR est AA: >60 mL/min/{1.73_m2} (ref >60)
GFR est non-AA: >60 mL/min/{1.73_m2} (ref >60)
Globulin: 2.7 g/dL (ref 2.0–4.0)
Glucose: 84 mg/dL (ref 74–99)
Potassium: 3.1 mmol/L — ABNORMAL LOW (ref 3.5–5.5)
Protein, total: 6.3 g/dL — ABNORMAL LOW (ref 6.4–8.2)
Sodium: 147 mmol/L — ABNORMAL HIGH (ref 136–145)

## 2019-08-01 LAB — HEMOGLOBIN A1C WITH EAG
Est. average glucose: 111 mg/dL
Hemoglobin A1c: 5.5 % (ref 4.2–5.6)

## 2019-08-01 LAB — VITAMIN D, 25 HYDROXY: Vitamin D 25-Hydroxy: 21.4 ng/mL — ABNORMAL LOW (ref 30–100)

## 2019-08-01 MED ORDER — LISINOPRIL 20 MG TAB
20 mg | ORAL_TABLET | Freq: Every day | ORAL | 0 refills | Status: DC
Start: 2019-08-01 — End: 2019-11-28

## 2019-08-01 MED ORDER — ERGOCALCIFEROL (VITAMIN D2) 50,000 UNIT CAP
1250 mcg (50,000 unit) | ORAL_CAPSULE | ORAL | 0 refills | Status: DC
Start: 2019-08-01 — End: 2019-08-29

## 2019-08-01 MED ORDER — SPIRONOLACTONE 25 MG TAB
25 mg | ORAL_TABLET | Freq: Every day | ORAL | 0 refills | Status: DC
Start: 2019-08-01 — End: 2019-08-29

## 2019-08-01 NOTE — Telephone Encounter (Signed)
Patient identified with 2 identifiers (name and DOB). Spoke with patient and she was advise on BS level should be 70-100 fasting. Patient was advise on ER precautions.

## 2019-08-01 NOTE — Progress Notes (Signed)
K level is still low. Suggest we modify her medications and start a potassium sparing BP medication aldactone 25 mg  Reduce lisinopril to 20 mg instead of 40 mg  And stop KCL supplement.  Her hormone check for aldosterone was abnormal, she was referred to endo. Is she scheduled?  Recheck BP next week

## 2019-08-01 NOTE — Progress Notes (Signed)
Patient identified with 2 identifiers (name and DOB).  Patient states she has not been taking her KCL. Dr. Sharmon Revere aware. Verbal order given by Dr. Donnetta Hail is to resume her KCL 20 meq and take 3 tablets daily to = 60 meqs daily. Patient has been advised to repeat labs next week.   Endo referral has been sent to Dr. Judeth Horn office.

## 2019-08-01 NOTE — Progress Notes (Signed)
K level is still low. Suggest we modify her medications and start a potassium sparing BP medication aldactone 25 mg  Reduce lisinopril to 20 mg instead of 40 mg  And stop KCL supplement.  Her hormone check for aldosterone was abnormal, she was referred to endo. Is she scheduled?  Recheck BP next week

## 2019-08-04 NOTE — Progress Notes (Signed)
Patient identified with 2 identifiers (name and DOB).  Patient states she has not been taking her KCL. Dr. Pascual aware. Verbal order given by Dr. Pascual-Patient is to resume her KCL 20 meq and take 3 tablets daily to = 60 meqs daily. Patient has been advised to repeat labs next week.   Endo referral has been sent to Dr. Larocques office.

## 2019-08-25 ENCOUNTER — Inpatient Hospital Stay: Admit: 2019-08-25 | Payer: MEDICARE | Primary: Family Medicine

## 2019-08-25 DIAGNOSIS — E876 Hypokalemia: Secondary | ICD-10-CM

## 2019-08-25 LAB — BASIC METABOLIC PANEL
Anion Gap: 5 mmol/L (ref 3.0–18)
BUN: 8 MG/DL (ref 7.0–18)
Bun/Cre Ratio: 17 (ref 12–20)
CO2: 28 mmol/L (ref 21–32)
Calcium: 9.1 MG/DL (ref 8.5–10.1)
Chloride: 112 mmol/L — ABNORMAL HIGH (ref 100–111)
Creatinine: 0.47 MG/DL — ABNORMAL LOW (ref 0.6–1.3)
EGFR IF NonAfrican American: >60 mL/min/{1.73_m2} (ref >60)
GFR African American: >60 mL/min/{1.73_m2} (ref >60)
Glucose: 89 mg/dL (ref 74–99)
Potassium: 4.1 mmol/L (ref 3.5–5.5)
Sodium: 145 mmol/L (ref 136–145)

## 2019-08-25 LAB — METABOLIC PANEL, BASIC
Anion gap: 5 mmol/L (ref 3.0–18)
BUN/Creatinine ratio: 17 (ref 12–20)
BUN: 8 MG/DL (ref 7.0–18)
CO2: 28 mmol/L (ref 21–32)
Calcium: 9.1 MG/DL (ref 8.5–10.1)
Chloride: 112 mmol/L — ABNORMAL HIGH (ref 100–111)
Creatinine: 0.47 MG/DL — ABNORMAL LOW (ref 0.6–1.3)
GFR est AA: >60 mL/min/{1.73_m2} (ref >60)
GFR est non-AA: >60 mL/min/{1.73_m2} (ref >60)
Glucose: 89 mg/dL (ref 74–99)
Potassium: 4.1 mmol/L (ref 3.5–5.5)
Sodium: 145 mmol/L (ref 136–145)

## 2019-08-25 NOTE — Progress Notes (Signed)
To be discussed on visit 4/13

## 2019-08-28 NOTE — Progress Notes (Signed)
To be discussed on visit 4/13

## 2019-08-29 ENCOUNTER — Ambulatory Visit: Attending: Family Medicine | Primary: Family Medicine

## 2019-08-29 ENCOUNTER — Ambulatory Visit: Admit: 2019-08-29 | Discharge: 2019-08-29 | Payer: MEDICARE | Attending: Family Medicine | Primary: Family Medicine

## 2019-08-29 DIAGNOSIS — I10 Essential (primary) hypertension: Secondary | ICD-10-CM

## 2019-08-29 MED ORDER — ERGOCALCIFEROL (VITAMIN D2) 50,000 UNIT CAP
1250 mcg (50,000 unit) | ORAL_CAPSULE | ORAL | 3 refills | Status: DC
Start: 2019-08-29 — End: 2020-05-21

## 2019-08-29 MED ORDER — DICLOFENAC 1 % TOPICAL GEL
1 % | Freq: Four times a day (QID) | CUTANEOUS | 0 refills | Status: DC | PRN
Start: 2019-08-29 — End: 2019-11-28

## 2019-08-29 NOTE — Progress Notes (Signed)
SUBJECTIVE  Ff-up  HTN- currently on lisinopril, recently saw dr Rush Barer for hypoK, concern for hyperaldo, ongoing eval. 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    ??  DM-diet controlled, she does not check glucose at home. She is no longer on metformin.    ??  HL- says she was taken off statin recently as well.  ??  Asthma ?COPD- non smoker, denies cough, sob, wheezing. She says last albuterol use was a year ago. Usually heat triggers respiratory symptoms.      ROS:  See HPI, all others negative        Patient Active Problem List   Diagnosis Code   ??? 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 Type 2 diabetes mellitus without complication (HCC) O96.2   ??? Incontinence in female R32   ??? Candidal intertrigo B37.2   ??? 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 Essential hypertension I10   ??? COPD (chronic obstructive pulmonary disease) (HCC) J44.9   ??? Hyperlipidemia E78.5   ??? Vitamin D deficiency E55.9   ??? Internal hemorrhoids K64.8   ??? Colon polyps K63.5   ??? Hypokalemia E87.6   ??? Hypomagnesemia E83.42   ??? Syncope R55   ??? Elevated troponin R77.8   ??? 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 Hypocalcemia E83.51   ??? Mild intermittent asthma J45.20       Current Outpatient Medications   Medication Sig Dispense Refill   ??? ergocalciferol (ERGOCALCIFEROL) 1,250 mcg (50,000 unit) capsule Take 1 Cap by mouth every seven (7) days. 12 Cap 3   ??? diclofenac (VOLTAREN) 1 % gel Apply 4 g to affected area four (4) times daily as needed for Pain. 1 Each 0   ??? lisinopriL (PRINIVIL, ZESTRIL) 20 mg tablet Take 1 Tab by mouth daily. 90 Tab 0   ??? Klor-Con M20 20 mEq tablet TAKE 3 TABLETS BY MOUTH EVERY DAY 90 Tab 0   ??? meloxicam (Mobic) 7.5 mg tablet Take 1 Tab by mouth daily. 14 Tab 0   ??? calcium citrate 200 mg (950 mg) tablet Take 1 Tab by mouth  daily. Indications: malabsorption 30 Tab 0   ??? multivitamin (ONE A DAY) tablet Take 1 Tab by mouth daily.     ??? thiamine HCL (B-1) 100 mg tablet Take  by mouth daily.     ??? cyanocobalamin 1,000 mcg tablet Take 1,000 mcg by mouth daily.     ??? cholecalciferol, VITAMIN D3, (VITAMIN D3) 5,000 unit tab tablet Take 5,000 Units by mouth daily.     ??? nystatin (MYCOSTATIN) topical cream Apply  to affected area two (2) times a day. 15 g 1   ??? alcohol swabs padm Use daily to test blood sugar  DX E11.9 100 Pad 1   ??? albuterol (PROVENTIL HFA, VENTOLIN HFA, PROAIR HFA) 90 mcg/actuation inhaler Take 1-2 Puffs by inhalation every four (4) hours as needed for Wheezing. 1 Inhaler 0       Allergies   Allergen Reactions   ??? Latex Rash   ??? Penicillins Itching   ??? Butrans [Buprenorphine] Rash   ??? Codeine Other (comments)     Nausea and abd pain   ??? Iodine Rash   ??? Lactose Diarrhea     Lactose intolerance   ??? Other Plant, Higher education careers adviser, Environmental  Hives     Conductive electrodes       Past Medical History:   Diagnosis Date   ??? Arthritis     knee, arms,    ??? Asthma    ??? Chronic obstructive pulmonary disease (HCC)    ??? Chronic pain    ??? Diabetes (HCC)    ??? H/O sinusitis    ??? Hypercholesterolemia    ??? Hypertension    ??? Sleep apnea     NOT USING    ??? Stress incontinence    ??? Toe fracture, left August 2015    4th toe       Social History     Socioeconomic History   ??? Marital status: SINGLE     Spouse name: Not on file   ??? Number of children: Not on file   ??? Years of education: Not on file   ??? Highest education level: Not on file   Occupational History   ??? Not on file   Social Needs   ??? Financial resource strain: Not on file   ??? Food insecurity     Worry: Not on file     Inability: Not on file   ??? Transportation needs     Medical: Not on file     Non-medical: Not on file   Tobacco Use   ??? Smoking status: Never Smoker   ??? Smokeless tobacco: Never Used   Substance and Sexual Activity   ??? Alcohol use: No     Alcohol/week: 0.0 standard drinks   ??? Drug  use: No   ??? Sexual activity: Not Currently   Lifestyle   ??? Physical activity     Days per week: Not on file     Minutes per session: Not on file   ??? Stress: Not on file   Relationships   ??? Social connections     Talks on phone: Not on file     Gets together: Not on file     Attends religious service: Not on file     Active member of club or organization: Not on file     Attends meetings of clubs or organizations: Not on file     Relationship status: Not on file   ??? Intimate partner violence     Fear of current or ex partner: Not on file     Emotionally abused: Not on file     Physically abused: Not on file     Forced sexual activity: Not on file   Other Topics Concern   ??? 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 Denies history of smoking        Family History   Problem Relation Age of Onset   ??? Hypertension Mother    ??? Stroke Mother    ??? Diabetes Mother    ??? Thyroid Disease Mother    ??? Arthritis-rheumatoid Mother    ??? Cancer Father         colon polpe   ??? Cancer Maternal Aunt          OBJECTIVE    Physical Exam:     Visit Vitals  BP 138/80 (BP 1 Location: Left upper arm, BP Patient Position: Sitting, BP Cuff Size: Adult)   Pulse 66   Temp 97.3 ??F (36.3 ??C) (Temporal)   Resp 16   Ht 5' 3" (1.6 m)   Wt 179 lb 9.6 oz (81.5   kg)   SpO2 100%   BMI 31.81 kg/m??       General: alert, well-appearing,obese,AA, in no apparent distress or pain  Neck: supple, no adenopathy palpated  CVS: normal rate, regular rhythm, distinct S1 and S2  Lungs:clear to ausculation bilaterally, no crackles, wheezing or rhonchi noted  Abdomen: normoactive bowel sounds, soft, non-tender  Extremities: no edema, no cyanosis, MSK grossly normal  Feet: no lesions, normal monofilament  Skin: warm, no lesions, rashes noted  Psych:  mood and affect normal  Results for orders placed or performed during the hospital encounter of 08/25/19   METABOLIC PANEL, BASIC   Result Value Ref Range     Sodium 145 136 - 145 mmol/L    Potassium 4.1 3.5 - 5.5 mmol/L    Chloride 112 (H) 100 - 111 mmol/L    CO2 28 21 - 32 mmol/L    Anion gap 5 3.0 - 18 mmol/L    Glucose 89 74 - 99 mg/dL    BUN 8 7.0 - 18 MG/DL    Creatinine 9.24 (L) 0.6 - 1.3 MG/DL    BUN/Creatinine ratio 17 12 - 20      GFR est AA >60 >60 ml/min/1.70m2    GFR est non-AA >60 >60 ml/min/1.43m2    Calcium 9.1 8.5 - 10.1 MG/DL           ASSESSMENT/PLAN  Diagnoses and all orders for this visit:     Essential hypertension  controlled  DASH diet  Cont lisinopril  ??  Diabetes mellitus type 2, diet-controlled (HCC)  Off meds after gastric bypass 10/19       Mild intermittent asthma, unspecified whether complicated  Controlled  Cont prn albuterol  ??flu vaccine given     Pure hypercholesterolemia  LDL < 100 05/2018, update lipids prior to next visit  Pt recently came off statin  Monitoring     Chronic obstructive pulmonary disease, unspecified COPD type (HCC)  Unclear h/o, nonsmoker  pt reports recent pulm clearance prior to gastric bypass ?[facility]  Cont prn albuterol    BMI 31  S/p gastric bypass    Hypokalemia  Now wnl  Cont kcl 60 meqs daily  monitoring  -     potassium chloride (K-DUR, KLOR-CON) 20 mEq tablet; Take 3 Tabs by mouth daily.  Ongoing hyper aldo eval with endo dr. Awanda Mink- request records    Vitamin D deficiency  Mildly low s/p gastric bypass  Increase to 50 k weekly  Recheck level prior to next visit    Malabsorption  Labs prior to next visit vit b12/folate/b1/cbc/iron profile/vitamin D    S/p gastric bypass  Avoid NSAIDs    Follow-up and Dispositions    ?? Return in about 3 months (around 11/28/2019), or if symptoms worsen or fail to improve, for non-fasting labs prior to your next visit, routine chronic illness care.           Patient understands plan of care. Patient has provided input and agrees with goals.

## 2019-08-29 NOTE — Progress Notes (Addendum)
1. Have you been to the ER, urgent care clinic since your last visit?  Hospitalized since your last visit? No    2. Have you seen or consulted any other health care providers outside of the [facility] since your last visit?  Include any pap smears or colon screening. Dr. LaRocque LOV: 08/25/19.    Chief Complaint   Patient presents with   ??? Hypertension   ??? Diabetes   ??? Asthma   ??? Cholesterol Problem   ??? COPD

## 2019-08-29 NOTE — Patient Instructions (Addendum)
DASH Diet: Care Instructions  Your Care Instructions     The DASH diet is an eating plan that can help lower your blood pressure. DASH stands for Dietary Approaches to Stop Hypertension. Hypertension is high blood pressure.  The DASH diet focuses on eating foods that are high in calcium, potassium, and magnesium. These nutrients can lower blood pressure. The foods that are highest in these nutrients are fruits, vegetables, low-fat dairy products, nuts, seeds, and legumes. But taking calcium, potassium, and magnesium supplements instead of eating foods that are high in those nutrients does not have the same effect. The DASH diet also includes whole grains, fish, and poultry.  The DASH diet is one of several lifestyle changes your doctor may recommend to lower your high blood pressure. Your doctor may also want you to decrease the amount of sodium in your diet. Lowering sodium while following the DASH diet can lower blood pressure even further than just the DASH diet alone.  Follow-up care is a key part of your treatment and safety. Be sure to make and go to all appointments, and call your doctor if you are having problems. It's also a good idea to know your test results and keep a list of the medicines you take.  How can you care for yourself at home?  Following the DASH diet  ?? Eat 4 to 5 servings of fruit each day. A serving is 1 medium-sized piece of fruit, ?? cup chopped or canned fruit, 1/4 cup dried fruit, or 4 ounces (?? cup) of fruit juice. Choose fruit more often than fruit juice.  ?? Eat 4 to 5 servings of vegetables each day. A serving is 1 cup of lettuce or raw leafy vegetables, ?? cup of chopped or cooked vegetables, or 4 ounces (?? cup) of vegetable juice. Choose vegetables more often than vegetable juice.  ?? Get 2 to 3 servings of low-fat and fat-free dairy each day. A serving is 8 ounces of milk, 1 cup of yogurt, or 1 ?? ounces of cheese.  ?? Eat 6 to 8 servings of grains each day. A serving is 1 slice  of bread, 1 ounce of dry cereal, or ?? cup of cooked rice, pasta, or cooked cereal. Try to choose whole-grain products as much as possible.  ?? Limit lean meat, poultry, and fish to 2 servings each day. A serving is 3 ounces, about the size of a deck of cards.  ?? Eat 4 to 5 servings of nuts, seeds, and legumes (cooked dried beans, lentils, and split peas) each week. A serving is 1/3 cup of nuts, 2 tablespoons of seeds, or ?? cup of cooked beans or peas.  ?? Limit fats and oils to 2 to 3 servings each day. A serving is 1 teaspoon of vegetable oil or 2 tablespoons of salad dressing.  ?? Limit sweets and added sugars to 5 servings or less a week. A serving is 1 tablespoon jelly or jam, ?? cup sorbet, or 1 cup of lemonade.  ?? Eat less than 2,300 milligrams (mg) of sodium a day. If you limit your sodium to 1,500 mg a day, you can lower your blood pressure even more.  ?? Be aware that all of these are the suggested number of servings for people who eat 1,800 to 2,000 calories a day. Your recommended number of servings may be different if you need more or fewer calories.  Tips for success  ?? Start small. Do not try to make dramatic changes to your   diet all at once. You might feel that you are missing out on your favorite foods and then be more likely to not follow the plan. Make small changes, and stick with them. Once those changes become habit, add a few more changes.  ?? Try some of the following:  ? Make it a goal to eat a fruit or vegetable at every meal and at snacks. This will make it easy to get the recommended amount of fruits and vegetables each day.  ? Try yogurt topped with fruit and nuts for a snack or healthy dessert.  ? Add lettuce, tomato, cucumber, and onion to sandwiches.  ? Combine a ready-made pizza crust with low-fat mozzarella cheese and lots of vegetable toppings. Try using tomatoes, squash, spinach, broccoli, carrots, cauliflower, and onions.  ? Have a variety of cut-up vegetables with a low-fat dip as an  appetizer instead of chips and dip.  ? Sprinkle sunflower seeds or chopped almonds over salads. Or try adding chopped walnuts or almonds to cooked vegetables.  ? Try some vegetarian meals using beans and peas. Add garbanzo or kidney beans to salads. Make burritos and tacos with mashed pinto beans or black beans.  Where can you learn more?  Go to https://www.healthwise.net/[employer]  Enter H967 in the search box to learn more about "DASH Diet: Care Instructions."  Current as of: January 16, 2019??????????????????????????????Content Version: 12.8  ?? 2006-2021 [employer].   Care instructions adapted under license by [employer] (which disclaims liability or warranty for this information). If you have questions about a medical condition or this instruction, always ask your healthcare professional. [employer] disclaims any warranty or liability for your use of this information.           DASH Diet: Care Instructions  Your Care Instructions     The DASH diet is an eating plan that can help lower your blood pressure. DASH stands for Dietary Approaches to Stop Hypertension. Hypertension is high blood pressure.  The DASH diet focuses on eating foods that are high in calcium, potassium, and magnesium. These nutrients can lower blood pressure. The foods that are highest in these nutrients are fruits, vegetables, low-fat dairy products, nuts, seeds, and legumes. But taking calcium, potassium, and magnesium supplements instead of eating foods that are high in those nutrients does not have the same effect. The DASH diet also includes whole grains, fish, and poultry.  The DASH diet is one of several lifestyle changes your doctor may recommend to lower your high blood pressure. Your doctor may also want you to decrease the amount of sodium in your diet. Lowering sodium while following the DASH diet can lower blood pressure even further than just the DASH diet alone.  Follow-up care is a key part of  your treatment and safety. Be sure to make and go to all appointments, and call your doctor if you are having problems. It's also a good idea to know your test results and keep a list of the medicines you take.  How can you care for yourself at home?  Following the DASH diet  ?? Eat 4 to 5 servings of fruit each day. A serving is 1 medium-sized piece of fruit, ?? cup chopped or canned fruit, 1/4 cup dried fruit, or 4 ounces (?? cup) of fruit juice. Choose fruit more often than fruit juice.  ?? Eat 4 to 5 servings of vegetables each day. A serving is 1 cup of lettuce or raw leafy vegetables, ?? cup of   chopped or cooked vegetables, or 4 ounces (?? cup) of vegetable juice. Choose vegetables more often than vegetable juice.  ?? Get 2 to 3 servings of low-fat and fat-free dairy each day. A serving is 8 ounces of milk, 1 cup of yogurt, or 1 ?? ounces of cheese.  ?? Eat 6 to 8 servings of grains each day. A serving is 1 slice of bread, 1 ounce of dry cereal, or ?? cup of cooked rice, pasta, or cooked cereal. Try to choose whole-grain products as much as possible.  ?? Limit lean meat, poultry, and fish to 2 servings each day. A serving is 3 ounces, about the size of a deck of cards.  ?? Eat 4 to 5 servings of nuts, seeds, and legumes (cooked dried beans, lentils, and split peas) each week. A serving is 1/3 cup of nuts, 2 tablespoons of seeds, or ?? cup of cooked beans or peas.  ?? Limit fats and oils to 2 to 3 servings each day. A serving is 1 teaspoon of vegetable oil or 2 tablespoons of salad dressing.  ?? Limit sweets and added sugars to 5 servings or less a week. A serving is 1 tablespoon jelly or jam, ?? cup sorbet, or 1 cup of lemonade.  ?? Eat less than 2,300 milligrams (mg) of sodium a day. If you limit your sodium to 1,500 mg a day, you can lower your blood pressure even more.  ?? Be aware that all of these are the suggested number of servings for people who eat 1,800 to 2,000 calories a day. Your recommended number of servings may  be different if you need more or fewer calories.  Tips for success  ?? Start small. Do not try to make dramatic changes to your diet all at once. You might feel that you are missing out on your favorite foods and then be more likely to not follow the plan. Make small changes, and stick with them. Once those changes become habit, add a few more changes.  ?? Try some of the following:  ? Make it a goal to eat a fruit or vegetable at every meal and at snacks. This will make it easy to get the recommended amount of fruits and vegetables each day.  ? Try yogurt topped with fruit and nuts for a snack or healthy dessert.  ? Add lettuce, tomato, cucumber, and onion to sandwiches.  ? Combine a ready-made pizza crust with low-fat mozzarella cheese and lots of vegetable toppings. Try using tomatoes, squash, spinach, broccoli, carrots, cauliflower, and onions.  ? Have a variety of cut-up vegetables with a low-fat dip as an appetizer instead of chips and dip.  ? Sprinkle sunflower seeds or chopped almonds over salads. Or try adding chopped walnuts or almonds to cooked vegetables.  ? Try some vegetarian meals using beans and peas. Add garbanzo or kidney beans to salads. Make burritos and tacos with mashed pinto beans or black beans.  Where can you learn more?  Go to https://www.healthwise.net/[employer]HelpConnections  Enter H967 in the search box to learn more about "DASH Diet: Care Instructions."  Current as of: January 16, 2019??????????????????????????????Content Version: 12.8  ?? 2006-2021 [employer].   Care instructions adapted under license by [employer] (which disclaims liability or warranty for this information). If you have questions about a medical condition or this instruction, always ask your healthcare professional. [employer] disclaims any warranty or liability for your use of this information.

## 2019-08-29 NOTE — Progress Notes (Signed)
SUBJECTIVE  Cheryl Paul  HTN- currently on lisinopril, recently saw dr Rush Barer for hypoK, concern for hyperaldo, ongoing eval. 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    ??  DM-diet controlled, she does not check glucose at home. She is no longer on metformin.    ??  HL- says she was taken off statin recently as well.  ??  Asthma ?COPD- non smoker, denies cough, sob, wheezing. She says last albuterol use was a year ago. Usually heat triggers respiratory symptoms.      ROS:  See HPI, all others negative        Patient Active Problem List   Diagnosis Code   ??? 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 Type 2 diabetes mellitus without complication (HCC) O96.2   ??? Incontinence in female R32   ??? Candidal intertrigo B37.2   ??? 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 Essential hypertension I10   ??? COPD (chronic obstructive pulmonary disease) (HCC) J44.9   ??? Hyperlipidemia E78.5   ??? Vitamin D deficiency E55.9   ??? Internal hemorrhoids K64.8   ??? Colon polyps K63.5   ??? Hypokalemia E87.6   ??? Hypomagnesemia E83.42   ??? Syncope R55   ??? Elevated troponin R77.8   ??? 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 Hypocalcemia E83.51   ??? Mild intermittent asthma J45.20       Current Outpatient Medications   Medication Sig Dispense Refill   ??? ergocalciferol (ERGOCALCIFEROL) 1,250 mcg (50,000 unit) capsule Take 1 Cap by mouth every seven (7) days. 12 Cap 3   ??? diclofenac (VOLTAREN) 1 % gel Apply 4 g to affected area four (4) times daily as needed for Pain. 1 Each 0   ??? lisinopriL (PRINIVIL, ZESTRIL) 20 mg tablet Take 1 Tab by mouth daily. 90 Tab 0   ??? Klor-Con M20 20 mEq tablet TAKE 3 TABLETS BY MOUTH EVERY DAY 90 Tab 0   ??? meloxicam (Mobic) 7.5 mg tablet Take 1 Tab by mouth daily. 14 Tab 0   ??? calcium citrate 200 mg (950 mg) tablet Take 1 Tab by mouth  daily. Indications: malabsorption 30 Tab 0   ??? multivitamin (ONE A DAY) tablet Take 1 Tab by mouth daily.     ??? thiamine HCL (B-1) 100 mg tablet Take  by mouth daily.     ??? cyanocobalamin 1,000 mcg tablet Take 1,000 mcg by mouth daily.     ??? cholecalciferol, VITAMIN D3, (VITAMIN D3) 5,000 unit tab tablet Take 5,000 Units by mouth daily.     ??? nystatin (MYCOSTATIN) topical cream Apply  to affected area two (2) times a day. 15 g 1   ??? alcohol swabs padm Use daily to test blood sugar  DX E11.9 100 Pad 1   ??? albuterol (PROVENTIL HFA, VENTOLIN HFA, PROAIR HFA) 90 mcg/actuation inhaler Take 1-2 Puffs by inhalation every four (4) hours as needed for Wheezing. 1 Inhaler 0       Allergies   Allergen Reactions   ??? Latex Rash   ??? Penicillins Itching   ??? Butrans [Buprenorphine] Rash   ??? Codeine Other (comments)     Nausea and abd pain   ??? Iodine Rash   ??? Lactose Diarrhea     Lactose intolerance   ??? Other Plant, Higher education careers adviser, Environmental  Hives     Conductive electrodes       Past Medical History:   Diagnosis Date   ??? Arthritis     knee, arms,    ??? Asthma    ??? Chronic obstructive pulmonary disease (HCC)    ??? Chronic pain    ??? Diabetes (HCC)    ??? H/O sinusitis    ??? Hypercholesterolemia    ??? Hypertension    ??? Sleep apnea     NOT USING    ??? Stress incontinence    ??? Toe fracture, left August 2015    4th toe       Social History     Socioeconomic History   ??? Marital status: SINGLE     Spouse name: Not on file   ??? Number of children: Not on file   ??? Years of education: Not on file   ??? Highest education level: Not on file   Occupational History   ??? Not on file   Social Needs   ??? Financial resource strain: Not on file   ??? Food insecurity     Worry: Not on file     Inability: Not on file   ??? Transportation needs     Medical: Not on file     Non-medical: Not on file   Tobacco Use   ??? Smoking status: Never Smoker   ??? Smokeless tobacco: Never Used   Substance and Sexual Activity   ??? Alcohol use: No     Alcohol/week: 0.0 standard drinks   ??? Drug  use: No   ??? Sexual activity: Not Currently   Lifestyle   ??? Physical activity     Days per week: Not on file     Minutes per session: Not on file   ??? Stress: Not on file   Relationships   ??? Social Wellsite geologist on phone: Not on file     Gets together: Not on file     Attends religious service: Not on file     Active member of club or organization: Not on file     Attends meetings of clubs or organizations: Not on file     Relationship status: Not on file   ??? Intimate partner violence     Fear of current or ex partner: Not on file     Emotionally abused: Not on file     Physically abused: Not on file     Forced sexual activity: Not on file   Other Topics Concern   ??? 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 Denies history of smoking        Family History   Problem Relation Age of Onset   ??? Hypertension Mother    ??? Stroke Mother    ??? Diabetes Mother    ??? Thyroid Disease Mother    ??? Arthritis-rheumatoid Mother    ??? Cancer Father         colon polpe   ??? Cancer Maternal Aunt          OBJECTIVE    Physical Exam:     Visit Vitals  BP 138/80 (BP 1 Location: Left upper arm, BP Patient Position: Sitting, BP Cuff Size: Adult)   Pulse 66   Temp 97.3 ??F (36.3 ??C) (Temporal)   Resp 16   Ht 5\' 3"  (1.6 m)   Wt 179 lb 9.6 oz (81.5  kg)   SpO2 100%   BMI 31.81 kg/m??       General: alert, well-appearing,obese,AA, in no apparent distress or pain  Neck: supple, no adenopathy palpated  CVS: normal rate, regular rhythm, distinct S1 and S2  Lungs:clear to ausculation bilaterally, no crackles, wheezing or rhonchi noted  Abdomen: normoactive bowel sounds, soft, non-tender  Extremities: no edema, no cyanosis, MSK grossly normal  Feet: no lesions, normal monofilament  Skin: warm, no lesions, rashes noted  Psych:  mood and affect normal  Results for orders placed or performed during the hospital encounter of 08/25/19   METABOLIC PANEL, BASIC   Result Value Ref Range     Sodium 145 136 - 145 mmol/L    Potassium 4.1 3.5 - 5.5 mmol/L    Chloride 112 (H) 100 - 111 mmol/L    CO2 28 21 - 32 mmol/L    Anion gap 5 3.0 - 18 mmol/L    Glucose 89 74 - 99 mg/dL    BUN 8 7.0 - 18 MG/DL    Creatinine 9.24 (L) 0.6 - 1.3 MG/DL    BUN/Creatinine ratio 17 12 - 20      GFR est AA >60 >60 ml/min/1.70m2    GFR est non-AA >60 >60 ml/min/1.43m2    Calcium 9.1 8.5 - 10.1 MG/DL           ASSESSMENT/PLAN  Diagnoses and all orders for this visit:     Essential hypertension  controlled  DASH diet  Cont lisinopril  ??  Diabetes mellitus type 2, diet-controlled (HCC)  Off meds after gastric bypass 10/19       Mild intermittent asthma, unspecified whether complicated  Controlled  Cont prn albuterol  ??flu vaccine given     Pure hypercholesterolemia  LDL < 100 05/2018, update lipids prior to next visit  Pt recently came off statin  Monitoring     Chronic obstructive pulmonary disease, unspecified COPD type (HCC)  Unclear h/o, nonsmoker  pt reports recent pulm clearance prior to gastric bypass ?[facility]  Cont prn albuterol    BMI 31  S/p gastric bypass    Hypokalemia  Now wnl  Cont kcl 60 meqs daily  monitoring  -     potassium chloride (K-DUR, KLOR-CON) 20 mEq tablet; Take 3 Tabs by mouth daily.  Ongoing hyper aldo eval with endo dr. Awanda Mink- request records    Vitamin D deficiency  Mildly low s/p gastric bypass  Increase to 50 k weekly  Recheck level prior to next visit    Malabsorption  Labs prior to next visit vit b12/folate/b1/cbc/iron profile/vitamin D    S/p gastric bypass  Avoid NSAIDs    Follow-up and Dispositions    ?? Return in about 3 months (around 11/28/2019), or if symptoms worsen or fail to improve, for non-fasting labs prior to your next visit, routine chronic illness care.           Patient understands plan of care. Patient has provided input and agrees with goals.

## 2019-08-29 NOTE — Progress Notes (Signed)
 1. Have you been to the ER, urgent care clinic since your last visit?  Hospitalized since your last visit? No    2. Have you seen or consulted any other health care providers outside of the [facility] since your last visit?  Include any pap smears or colon screening. Dr. Jacelyn LOV: 08/25/19.    Chief Complaint   Patient presents with   . Hypertension   . Diabetes   . Asthma   . Cholesterol Problem   . COPD

## 2019-09-28 ENCOUNTER — Encounter: Attending: Family Medicine | Primary: Family Medicine

## 2019-11-21 ENCOUNTER — Inpatient Hospital Stay: Admit: 2019-11-21 | Payer: MEDICARE | Primary: Family Medicine

## 2019-11-21 DIAGNOSIS — K909 Intestinal malabsorption, unspecified: Secondary | ICD-10-CM

## 2019-11-21 LAB — IRON AND TIBC
Iron Saturation: 23 % (ref 20–50)
Iron: 75 ug/dL (ref 50–175)
TIBC: 326 ug/dL (ref 250–450)

## 2019-11-21 LAB — CBC WITH AUTO DIFFERENTIAL
Basophils %: 1 % (ref 0–2)
Basophils Absolute: 0 10*3/uL (ref 0.0–0.1)
Eosinophils %: 2 % (ref 0–5)
Eosinophils Absolute: 0.1 10*3/uL (ref 0.0–0.4)
Hematocrit: 42.3 % (ref 35.0–45.0)
Hemoglobin: 13.3 g/dL (ref 12.0–16.0)
Lymphocytes %: 31 % (ref 21–52)
Lymphocytes Absolute: 2.1 10*3/uL (ref 0.9–3.6)
MCH: 27.4 PG (ref 24.0–34.0)
MCHC: 31.4 g/dL (ref 31.0–37.0)
MCV: 87.2 FL (ref 74.0–97.0)
MPV: 13.6 FL — ABNORMAL HIGH (ref 9.2–11.8)
Monocytes %: 7 % (ref 3–10)
Monocytes Absolute: 0.5 10*3/uL (ref 0.05–1.2)
Neutrophils %: 59 % (ref 40–73)
Neutrophils Absolute: 3.9 10*3/uL (ref 1.8–8.0)
Platelets: 111 10*3/uL — ABNORMAL LOW (ref 135–420)
RBC: 4.85 M/uL (ref 4.20–5.30)
RDW: 13.9 % (ref 11.6–14.5)
WBC: 6.5 10*3/uL (ref 4.6–13.2)

## 2019-11-21 LAB — FERRITIN
Ferritin: 115 NG/ML (ref 8–388)
Ferritin: 115 NG/ML (ref 8–388)

## 2019-11-21 LAB — VITAMIN B12 & FOLATE
Folate: 7.1 ng/mL (ref 3.10–17.50)
Folate: 7.1 ng/mL (ref 3.10–17.50)
Vitamin B-12: 522 pg/mL (ref 211–911)
Vitamin B12: 522 pg/mL (ref 211–911)

## 2019-11-21 LAB — VITAMIN D 25 HYDROXY: Vit D, 25-Hydroxy: 29.5 ng/mL — ABNORMAL LOW (ref 30–100)

## 2019-11-21 LAB — CBC WITH AUTOMATED DIFF
ABS. BASOPHILS: 0 10*3/uL (ref 0.0–0.1)
ABS. EOSINOPHILS: 0.1 10*3/uL (ref 0.0–0.4)
ABS. LYMPHOCYTES: 2.1 10*3/uL (ref 0.9–3.6)
ABS. MONOCYTES: 0.5 10*3/uL (ref 0.05–1.2)
ABS. NEUTROPHILS: 3.9 10*3/uL (ref 1.8–8.0)
BASOPHILS: 1 % (ref 0–2)
EOSINOPHILS: 2 % (ref 0–5)
HCT: 42.3 % (ref 35.0–45.0)
HGB: 13.3 g/dL (ref 12.0–16.0)
LYMPHOCYTES: 31 % (ref 21–52)
MCH: 27.4 PG (ref 24.0–34.0)
MCHC: 31.4 g/dL (ref 31.0–37.0)
MCV: 87.2 FL (ref 74.0–97.0)
MONOCYTES: 7 % (ref 3–10)
MPV: 13.6 FL — ABNORMAL HIGH (ref 9.2–11.8)
NEUTROPHILS: 59 % (ref 40–73)
PLATELET: 111 10*3/uL — ABNORMAL LOW (ref 135–420)
RBC: 4.85 M/uL (ref 4.20–5.30)
RDW: 13.9 % (ref 11.6–14.5)
WBC: 6.5 10*3/uL (ref 4.6–13.2)

## 2019-11-21 LAB — IRON PROFILE
Iron % saturation: 23 % (ref 20–50)
Iron: 75 ug/dL (ref 50–175)
TIBC: 326 ug/dL (ref 250–450)

## 2019-11-21 LAB — VITAMIN D, 25 HYDROXY: Vitamin D 25-Hydroxy: 29.5 ng/mL — ABNORMAL LOW (ref 30–100)

## 2019-11-26 LAB — VITAMIN B1, WHOLE BLOOD
Vitamin B1,Whole Blood: 151.8 nmol/L (ref 66.5–200.0)
Vitamin B1: 151.8 nmol/L (ref 66.5–200.0)

## 2019-11-27 NOTE — Telephone Encounter (Signed)
This pharmacy faxed over request for the following prescriptions to be filled:    Medication requested :   Requested Prescriptions     Pending Prescriptions Disp Refills   . lisinopriL (PRINIVIL, ZESTRIL) 20 mg tablet 90 Tablet 0     Sig: Take 1 Tablet by mouth daily.     PCP: Sharmon Revere  Pharmacy or Print: [facility]  Mail order or Local pharmacy Mail Order     Scheduled appointment if not seen by current providers in office: lov 08/29/2019 f/u 11/28/2019

## 2019-11-28 ENCOUNTER — Ambulatory Visit: Attending: Family Medicine | Primary: Family Medicine

## 2019-11-28 ENCOUNTER — Ambulatory Visit: Admit: 2019-11-28 | Discharge: 2019-11-28 | Payer: MEDICARE | Attending: Family Medicine | Primary: Family Medicine

## 2019-11-28 DIAGNOSIS — E119 Type 2 diabetes mellitus without complications: Secondary | ICD-10-CM

## 2019-11-28 MED ORDER — LISINOPRIL 20 MG TAB
20 mg | ORAL_TABLET | Freq: Two times a day (BID) | ORAL | 0 refills | Status: DC
Start: 2019-11-28 — End: 2020-02-19

## 2019-11-28 MED ORDER — DICLOFENAC 1 % TOPICAL GEL
1 % | Freq: Four times a day (QID) | CUTANEOUS | 3 refills | Status: DC | PRN
Start: 2019-11-28 — End: 2020-05-21

## 2019-11-28 NOTE — Progress Notes (Signed)
1. Have you been to the ER, urgent care clinic since your last visit?  Hospitalized since your last visit? No    2. Have you seen or consulted any other health care providers outside of the [facility] since your last visit?  Include any pap smears or colon screening. No    Chief Complaint   Patient presents with   . Diabetes   . Hypertension   . Cholesterol Problem   . Asthma   . COPD   . Vitamin D Deficiency   . Other     S/P gastric bypass     Per patient, her last diabetic eye exam with Dr. Janeal Holmes was a couple of years ago. Patient will contact Dr. Reed Pandy to schedule an appointment. I contacted Dr. Threasa Alpha office to request last eye exam note be faxed to [facility], [phone].

## 2019-11-28 NOTE — Progress Notes (Signed)
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    DM-diet controlled, she does not check glucose at home. She is no longer on metformin.    ??  Asthma ?COPD- non smoker, denies cough, sob, wheezing. She says last albuterol use was a year ago. Usually heat triggers respiratory symptoms.      ROS:  See HPI, all others negative        Patient Active Problem List   Diagnosis Code   ??? 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 Type 2 diabetes mellitus without complication (HCC) E11.9   ??? Incontinence in female R32   ??? Candidal intertrigo B37.2   ??? 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 Essential hypertension I10   ??? COPD (chronic obstructive pulmonary disease) (HCC) J44.9   ??? Hyperlipidemia E78.5   ??? Vitamin D deficiency E55.9   ??? Internal hemorrhoids K64.8   ??? Colon polyps K63.5   ??? Hypokalemia E87.6   ??? Hypomagnesemia E83.42   ??? Syncope R55   ??? Elevated troponin R77.8   ??? 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 Hypocalcemia E83.51   ??? Mild intermittent asthma J45.20       Current Outpatient Medications   Medication Sig Dispense Refill   ??? MAGNESIUM PO Take  by mouth.     ??? diclofenac (VOLTAREN) 1 % gel Apply 4 g to affected area four (4) times daily as needed for Pain. 1 Each 3   ??? lisinopriL (PRINIVIL, ZESTRIL) 20 mg tablet Take 1 Tablet by mouth two (2) times a day. 180 Tablet 0   ??? ergocalciferol (ERGOCALCIFEROL) 1,250 mcg (50,000 unit) capsule Take 1 Cap by mouth every seven (7) days. 12 Cap 3   ??? Klor-Con M20 20 mEq tablet TAKE 3 TABLETS BY MOUTH EVERY DAY 90 Tab 0   ??? calcium citrate 200 mg (950 mg) tablet Take 1 Tab by mouth daily. Indications: malabsorption 30 Tab 0   ??? multivitamin (ONE A DAY) tablet Take 1 Tab by mouth daily.     ???  thiamine HCL (B-1) 100 mg tablet Take  by mouth daily.     ??? cyanocobalamin 1,000 mcg tablet Take 1,000 mcg by mouth daily.     ??? albuterol (PROVENTIL HFA, VENTOLIN HFA, PROAIR HFA) 90 mcg/actuation inhaler Take 1-2 Puffs by inhalation every four (4) hours as needed for Wheezing. 1 Inhaler 0   ??? nystatin (MYCOSTATIN) topical cream Apply  to affected area two (2) times a day. 15 g 1   ??? alcohol swabs padm Use daily to test blood sugar  DX E11.9 100 Pad 1   ??? meloxicam (Mobic) 7.5 mg tablet Take 1 Tab by mouth daily. (Patient not taking: Reported on 11/28/2019) 14 Tab 0   ??? cholecalciferol, VITAMIN D3, (VITAMIN D3) 5,000 unit tab tablet Take 5,000 Units by mouth daily. (Patient not taking: Reported on 11/28/2019)         Allergies   Allergen Reactions   ??? Latex Rash   ??? Penicillins Itching   ??? Acetaminophen-Codeine Nausea Only and Other (comments)     Nausea and pains in stomach   ??? Butrans [Buprenorphine] Rash   ??? Codeine Other (comments)  Nausea and abd pain   ??? Iodine Rash   ??? Lactose Diarrhea     Lactose intolerance   ??? Other Plant, Animal, Environmental Hives     Conductive electrodes       Past Medical History:   Diagnosis Date   ??? Arthritis     knee, arms,    ??? Asthma    ??? Chronic obstructive pulmonary disease (HCC)    ??? Chronic pain    ??? Diabetes (HCC)    ??? H/O sinusitis    ??? Hypercholesterolemia    ??? Hypertension    ??? Sleep apnea     NOT USING    ??? Stress incontinence    ??? Toe fracture, left August 2015    4th toe       Social History     Socioeconomic History   ??? Marital status: SINGLE     Spouse name: Not on file   ??? Number of children: Not on file   ??? Years of education: Not on file   ??? Highest education level: Not on file   Occupational History   ??? Not on file   Tobacco Use   ??? Smoking status: Never Smoker   ??? Smokeless tobacco: Never Used   Substance and Sexual Activity   ??? Alcohol use: No     Alcohol/week: 0.0 standard drinks   ??? Drug use: No   ??? Sexual activity: Not Currently   Other Topics Concern    ??? 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 Denies history of smoking      Social Determinants of Psychologist, prison and probation services Strain:    ??? Difficulty of Paying Living Expenses:    Food Insecurity:    ??? Worried About Programme researcher, broadcasting/film/video in the Last Year:    ??? Barista in the Last Year:    Transportation Needs:    ??? Freight forwarder (Medical):    ??? Lack of Transportation (Non-Medical):    Physical Activity:    ??? Days of Exercise per Week:    ??? Minutes of Exercise per Session:    Stress:    ??? Feeling of Stress :    Social Connections:    ??? Frequency of Communication with Friends and Family:    ??? Frequency of Social Gatherings with Friends and Family:    ??? Attends Religious Services:    ??? Database administrator or Organizations:    ??? Attends Engineer, structural:    ??? Marital Status:    Intimate Programme researcher, broadcasting/film/video Violence:    ??? Fear of Current or Ex-Partner:    ??? Emotionally Abused:    ??? Physically Abused:    ??? Sexually Abused:        Family History   Problem Relation Age of Onset   ??? Hypertension Mother    ??? Stroke Mother    ??? Diabetes Mother    ??? Thyroid Disease Mother    ??? Arthritis-rheumatoid Mother    ??? Cancer Father         colon polpe   ??? Cancer Maternal Aunt          OBJECTIVE    Physical Exam:     Visit Vitals  BP (!) 156/80 (BP 1 Location: Left upper arm, BP Patient Position: Sitting, BP Cuff Size: Adult)   Pulse 91   Temp  97.8 ??F (36.6 ??C) (Temporal)   Resp 16   Ht 5\' 3"  (1.6 m)   Wt 184 lb 12.8 oz (83.8 kg)   SpO2 98%   BMI 32.74 kg/m??       General: alert, well-appearing,obese,AA, in no apparent distress or pain  Neck: supple, no adenopathy palpated  CVS: normal rate, regular rhythm, distinct S1 and S2  Lungs:clear to ausculation bilaterally, no crackles, wheezing or rhonchi noted  Abdomen: normoactive bowel sounds, soft, non-tender  Extremities: no edema, no cyanosis, MSK grossly normal  Skin: warm, no lesions, rashes  noted  Psych:  mood and affect normal  Results for orders placed or performed during the hospital encounter of 11/21/19   CBC WITH AUTOMATED DIFF   Result Value Ref Range    WBC 6.5 4.6 - 13.2 K/uL    RBC 4.85 4.20 - 5.30 M/uL    HGB 13.3 12.0 - 16.0 g/dL    HCT 02.442.3 09.735.0 - 35.345.0 %    MCV 87.2 74.0 - 97.0 FL    MCH 27.4 24.0 - 34.0 PG    MCHC 31.4 31.0 - 37.0 g/dL    RDW 29.913.9 24.211.6 - 68.314.5 %    PLATELET 111 (L) 135 - 420 K/uL    MPV 13.6 (H) 9.2 - 11.8 FL    NEUTROPHILS 59 40 - 73 %    LYMPHOCYTES 31 21 - 52 %    MONOCYTES 7 3 - 10 %    EOSINOPHILS 2 0 - 5 %    BASOPHILS 1 0 - 2 %    ABS. NEUTROPHILS 3.9 1.8 - 8.0 K/UL    ABS. LYMPHOCYTES 2.1 0.9 - 3.6 K/UL    ABS. MONOCYTES 0.5 0.05 - 1.2 K/UL    ABS. EOSINOPHILS 0.1 0.0 - 0.4 K/UL    ABS. BASOPHILS 0.0 0.0 - 0.1 K/UL    DF AUTOMATED     VITAMIN B1, WHOLE BLOOD   Result Value Ref Range    Vitamin B1 151.8 66.5 - 200.0 nmol/L   VITAMIN B12 & FOLATE   Result Value Ref Range    Vitamin B12 522 211 - 911 pg/mL    Folate 7.1 3.10 - 17.50 ng/mL   VITAMIN D, 25 HYDROXY   Result Value Ref Range    Vitamin D 25-Hydroxy 29.5 (L) 30 - 100 ng/mL   IRON PROFILE   Result Value Ref Range    Iron 75 50 - 175 ug/dL    TIBC 419326 622250 - 297450 ug/dL    Iron % saturation 23 20 - 50 %   FERRITIN   Result Value Ref Range    Ferritin 115 8 - 388 NG/ML           ASSESSMENT/PLAN  Diagnoses and all orders for this visit:     Essential hypertension  Needs better control  Increase lisinopril to 20 mg bid  DASH diet, BP Log  Ff-up in 4 weeks  ??  Diabetes mellitus type 2, diet-controlled (HCC)  Off meds after gastric bypass 10/19  Monitoring   Check a1c, cmp, microalbumin prior to next visit     Mild intermittent asthma, unspecified whether complicated  Controlled  Cont prn albuterol  ??flu vaccine given     Pure hypercholesterolemia   off statin after wt loss/gastric bypass 2019  Monitoring, satisfactory lipids 07/2019     Chronic obstructive pulmonary disease, unspecified COPD type (HCC)  Unclear h/o,  nonsmoker  pt reports recent pulm clearance prior to gastric  bypass ?[facility]  Cont prn albuterol    BMI 32  S/p gastric bypass    Hypokalemia  Now wnl  Cont kcl 60 meqs daily + mg  monitoring  hyperaldo eval neg    Vitamin D deficiency  Improving, s/p gastric bypass  Cont 50 k weekly  Monitoring    S/p gastric bypass      Follow-up and Dispositions    ?? Return in about 4 weeks (around 12/26/2019), or for BP recheck 12/2019, for and 3 months, non fasting labs prior, - plan on Medicare wellness on next visit 02/2020.           Patient understands plan of care. Patient has provided input and agrees with goals.

## 2020-01-01 ENCOUNTER — Ambulatory Visit: Payer: MEDICARE | Attending: Family Medicine | Primary: Family Medicine

## 2020-01-19 NOTE — Progress Notes (Signed)
01/19/2020:  Patient is ~ 2 years post op.  She called me and stated she is having diarrhea all the time.  She state she will eat and have to run to the bathroom.  She stated sometimes she will feel so sluggish and woozy after she ate.  Patient states that it doesn't matter if she sticks to meat and vegetables that I previously talked to her about, she is still having to go to the bathroom.  Patient stated her PCP wants to know if there is a different diet we can put her on instead.  When I inquired to patient if she is strictly eating protein and vegetables, as prescribed, and still getting diarrhea.  She stated she is not strictly following that and is using real sugar in her tea and is eating croissants and bread.  She will eat regular bacon and sausage.  I spent a lot of time talking to her about dumping syndrome and that is what she is experiencing from the carbohydrates, sugar, fatty foods.  I discussed with her that the shaky feeling she has upon eating carbohydrates is reactive hypoglycemia and a result from eating carbohydrates.  I spent a lot of time talking to her about this, but patient still feels she gets diarrhea all the time.  She stated some times her stools are runny and other times, they are normal BM. I talked to her that when they are runny, they are likely from eating sugar.  I have talked several times about this with the patient and the need to stop the sugar and the carbohydrates.  I have challenged to stick to protein and vegetables only.  Cut out the bread, cut out the sugar in her drinks and see if the diarrhea subsides.  If she is strictly following the protocol for the bariatric diet and still having these symptoms, we will go from there, but I have discussed with her that what she is describing and based on her diet history, this is dumping syndrome she is experiencing.  Will follow up with the patient.    Adela Ports, MS RD

## 2020-01-24 NOTE — Progress Notes (Signed)
Per MBSAQIP requirements;  E-mail and letter sent for follow up appointment.    [facility]  [facility]  [facility]      Dear Patient,    Your health is our main concern. It is important for your health to have follow-up lab work and to see your surgeon at 3 months, 6 months and annually after your weight loss surgery.          Additionally, the Department of bariatric Surgery at our hospital is a member of the Metabolic and Bariatric Surgery Accreditation and Quality Improvement Program (MBSAQIP).   As a participant in this program, we gather information on the outcomes of our patients after surgery.    Please call the office for a follow up appointment at [phone] ([place]) or [phone] ([facility]).     If you have moved out of the area or have changed surgeons please call us and let us know the name of your doctor.    Your health and feedback are important to us.  We greatly appreciate your response.       Thank you,  [facility]  [address]

## 2020-01-25 NOTE — Progress Notes (Signed)
Attempted to contact patient for Complex Care follow up. No answer, left message with contact information provided. Will attempt to contact at a later time.

## 2020-01-31 NOTE — Progress Notes (Signed)
Attempted to contact patient for Complex Care follow up. No answer, left message with contact information provided. Will attempt to contact at a later time.

## 2020-02-07 NOTE — Progress Notes (Signed)
Attempted to contact patient for Complex Care Management. No answer, left message with contact information provided. Unable to contact, will close episode.

## 2020-02-19 MED ORDER — LISINOPRIL 20 MG TAB
20 mg | ORAL_TABLET | ORAL | 1 refills | Status: DC
Start: 2020-02-19 — End: 2020-04-01

## 2020-02-26 ENCOUNTER — Inpatient Hospital Stay: Admit: 2020-02-26 | Payer: MEDICARE | Primary: Family Medicine

## 2020-02-26 DIAGNOSIS — E119 Type 2 diabetes mellitus without complications: Secondary | ICD-10-CM

## 2020-02-26 LAB — COMPREHENSIVE METABOLIC PANEL
ALT: 26 U/L (ref 13–56)
AST: 18 U/L (ref 10–38)
Albumin/Globulin Ratio: 1.3 (ref 0.8–1.7)
Albumin: 3.6 g/dL (ref 3.4–5.0)
Alkaline Phosphatase: 83 U/L (ref 45–117)
Anion Gap: 5 mmol/L (ref 3.0–18)
BUN: 9 MG/DL (ref 7.0–18)
Bun/Cre Ratio: 16 (ref 12–20)
CO2: 31 mmol/L (ref 21–32)
Calcium: 8.5 MG/DL (ref 8.5–10.1)
Chloride: 110 mmol/L (ref 100–111)
Creatinine: 0.57 MG/DL — ABNORMAL LOW (ref 0.6–1.3)
EGFR IF NonAfrican American: >60 mL/min/{1.73_m2} (ref >60)
GFR African American: >60 mL/min/{1.73_m2} (ref >60)
Globulin: 2.7 g/dL (ref 2.0–4.0)
Glucose: 87 mg/dL (ref 74–99)
Potassium: 3.3 mmol/L — ABNORMAL LOW (ref 3.5–5.5)
Sodium: 146 mmol/L — ABNORMAL HIGH (ref 136–145)
Total Bilirubin: 0.7 MG/DL (ref 0.2–1.0)
Total Protein: 6.3 g/dL — ABNORMAL LOW (ref 6.4–8.2)

## 2020-02-26 LAB — MICROALBUMIN / CREATININE URINE RATIO
Creatinine, Ur: 184 mg/dL — ABNORMAL HIGH (ref 30–125)
Microalb, Ur: 1.47 MG/DL (ref 0–3.0)
Microalbumin Creatinine Ratio: 8 mg/g (ref 0–30)

## 2020-02-26 LAB — HEMOGLOBIN A1C W/EAG
Hemoglobin A1C: 5.5 % (ref 4.2–5.6)
eAG: 111 mg/dL

## 2020-02-26 LAB — METABOLIC PANEL, COMPREHENSIVE
A-G Ratio: 1.3 (ref 0.8–1.7)
ALT (SGPT): 26 U/L (ref 13–56)
AST (SGOT): 18 U/L (ref 10–38)
Albumin: 3.6 g/dL (ref 3.4–5.0)
Alk. phosphatase: 83 U/L (ref 45–117)
Anion gap: 5 mmol/L (ref 3.0–18)
BUN/Creatinine ratio: 16 (ref 12–20)
BUN: 9 MG/DL (ref 7.0–18)
Bilirubin, total: 0.7 MG/DL (ref 0.2–1.0)
CO2: 31 mmol/L (ref 21–32)
Calcium: 8.5 MG/DL (ref 8.5–10.1)
Chloride: 110 mmol/L (ref 100–111)
Creatinine: 0.57 MG/DL — ABNORMAL LOW (ref 0.6–1.3)
GFR est AA: >60 mL/min/{1.73_m2} (ref >60)
GFR est non-AA: >60 mL/min/{1.73_m2} (ref >60)
Globulin: 2.7 g/dL (ref 2.0–4.0)
Glucose: 87 mg/dL (ref 74–99)
Potassium: 3.3 mmol/L — ABNORMAL LOW (ref 3.5–5.5)
Protein, total: 6.3 g/dL — ABNORMAL LOW (ref 6.4–8.2)
Sodium: 146 mmol/L — ABNORMAL HIGH (ref 136–145)

## 2020-02-26 LAB — HEMOGLOBIN A1C WITH EAG
Est. average glucose: 111 mg/dL
Hemoglobin A1c: 5.5 % (ref 4.2–5.6)

## 2020-02-26 LAB — MICROALBUMIN, UR, RAND W/ MICROALB/CREAT RATIO
Creatinine, urine random: 184 mg/dL — ABNORMAL HIGH (ref 30–125)
Microalbumin,urine random: 1.47 MG/DL (ref 0–3.0)
Microalbumin/Creat ratio (mg/g creat): 8 mg/g (ref 0–30)

## 2020-02-26 NOTE — Progress Notes (Signed)
Potassium is mildly low, has she been compliant with KCL 60 meqs + mg? If yes, will need to increase dose to 80 meqs daily.

## 2020-02-26 NOTE — Progress Notes (Signed)
Patient identified with 2 identifiers (name and DOB).  Spoke with patient and she is aware of mildly low potassium .   Patient is taking potassium 99 mg (otc) 3 tablets daily.  Patient states she was not able to swallow The KCL 20 meq's tablets. Changed to KCL 20 meq's packets. Pharmacy was not able to get for patient.   Patient has appt 03/04/2020

## 2020-03-04 ENCOUNTER — Ambulatory Visit: Attending: Family Medicine | Primary: Family Medicine

## 2020-03-04 ENCOUNTER — Ambulatory Visit: Admit: 2020-03-04 | Discharge: 2020-03-04 | Payer: MEDICARE | Attending: Family Medicine | Primary: Family Medicine

## 2020-03-04 DIAGNOSIS — Z0000 Encounter for general adult medical examination without abnormal findings: Secondary | ICD-10-CM

## 2020-03-04 MED ORDER — SPIRONOLACTONE 25 MG TAB
25 mg | ORAL_TABLET | Freq: Every day | ORAL | 0 refills | Status: DC
Start: 2020-03-04 — End: 2020-05-21

## 2020-03-04 NOTE — Progress Notes (Signed)
Patient presents for the following vaccines: Influenza (FLUAD). Patient consent obtained by patient. Patient tolerated procedure well at left deltoid. Patient remained in exam room, during vaccine documentation, for period of 10 minutes post injection to ensure no reaction. VIS was given to patient.    Lot # [id number]  Exp: 10/08/2020  MFG: [employer].  NDC: 04540-981-19

## 2020-03-04 NOTE — Progress Notes (Signed)
This is the Subsequent Medicare Annual Wellness Exam, performed 12 months or more after the Initial AWV or the last Subsequent AWV    I have reviewed the patient's medical history in detail and updated the computerized patient record.       Assessment/Plan   Education and counseling provided:  Are appropriate based on today's review and evaluation  Pneumococcal Vaccine- 23 completed  Influenza Vaccine-given today  Screening Mammography 07/2019  Colorectal cancer screening tests 10/2019 FIT due 10/2020  Cardiovascular screening blood test- 07/2019  Bone mass measurement (DEXA) 10/2018  Screening for glaucoma per pt utd, to send [place] records        Depression Risk Factor Screening     3 most recent PHQ Screens 03/04/2020   Little interest or pleasure in doing things Not at all   Feeling down, depressed, irritable, or hopeless Not at all   Total Score PHQ 2 0       Alcohol Risk Screen    Do you average more than 1 drink per night or more than 7 drinks a week:  No    On any one occasion in the past three months have you have had more than 3 drinks containing alcohol:  No        Functional Ability and Level of Safety    Hearing: Hearing is good.      Activities of Daily Living:  The home contains: no safety equipment.  Patient does total self care      Ambulation: with no difficulty     Fall Risk:  Fall Risk Assessment, last 12 mths 03/04/2020   Able to walk? Yes   Fall in past 12 months? 0   Do you feel unsteady? 1   Are you worried about falling 0   Is the gait abnormal? 0   Number of falls in past 12 months -   Fall with injury? -      Abuse Screen:  Patient is not abused       Cognitive Screening    Has your family/caregiver stated any concerns about your memory: no         Health Maintenance Due     Health Maintenance Due   Topic Date Due   ??? Shingrix Vaccine Age 65> (1 of 2) Never done   ??? Eye Exam Retinal or Dilated  11/27/2019       Patient Care Team   Patient Care Team:  Dalene Seltzer, MD as PCP - General (Family  Medicine)  Dalene Seltzer, MD as PCP - [facility] Empaneled Provider  Donavan Foil, MD (Pulmonary Disease)  Dale Durham, DO (General Surgery)  Jani Files, OD (Optometry)  Stefan Church, MD (Endocrinology)    History     Patient Active Problem List   Diagnosis Code   ??? 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 Type 2 diabetes mellitus without complication (HCC) Y70.6   ??? Incontinence in female R32   ??? Candidal intertrigo B37.2   ??? 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 Essential hypertension I10   ??? COPD (chronic obstructive pulmonary disease) (HCC) J44.9   ??? Hyperlipidemia E78.5   ??? Vitamin D deficiency E55.9   ??? Internal hemorrhoids K64.8   ??? Colon polyps K63.5   ??? Hypokalemia E87.6   ??? Hypomagnesemia E83.42   ??? Syncope R55   ??? Elevated troponin R77.8   ??? 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 Hypocalcemia E83.51   ???  Mild intermittent asthma J45.20     Past Medical History:   Diagnosis Date   ??? Arthritis     knee, arms,    ??? Asthma    ??? Chronic obstructive pulmonary disease ([place])    ??? Chronic pain    ??? Diabetes ([place])    ??? H/O sinusitis    ??? Hypercholesterolemia    ??? Hypertension    ??? Sleep apnea     NOT USING    ??? Stress incontinence    ??? Toe fracture, left August 2015    4th toe      Past Surgical History:   Procedure Laterality Date   ??? HX CARPAL TUNNEL RELEASE Bilateral    ??? HX CHOLECYSTECTOMY     ??? HX GYN     ??? HX HERNIA REPAIR      abdomen   ??? HX HYSTERECTOMY      tubes tied   ??? 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 HX ORTHOPAEDIC      catrpal tunnel right and left   ??? HX POLYPECTOMY  04/18/14   ??? PR ABDOMEN SURGERY PROC UNLISTED      4 surguries for blockages     Current Outpatient Medications   Medication Sig Dispense Refill   ??? potassium 99 mg tablet Take 99 mg by mouth daily.     ??? spironolactone (ALDACTONE) 25 mg tablet Take 1 Tablet by mouth daily. 90 Tablet 0   ??? lisinopriL (PRINIVIL, ZESTRIL) 20 mg tablet TAKE 1 TABLET TWICE DAILY 180 Tablet 1   ??? MAGNESIUM PO Take  by mouth.     ???  diclofenac (VOLTAREN) 1 % gel Apply 4 g to affected area four (4) times daily as needed for Pain. 1 Each 3   ??? ergocalciferol (ERGOCALCIFEROL) 1,250 mcg (50,000 unit) capsule Take 1 Cap by mouth every seven (7) days. 12 Cap 3   ??? calcium citrate 200 mg (950 mg) tablet Take 1 Tab by mouth daily. Indications: malabsorption 30 Tab 0   ??? multivitamin (ONE A DAY) tablet Take 1 Tab by mouth daily.     ??? thiamine HCL (B-1) 100 mg tablet Take  by mouth daily.     ??? cyanocobalamin 1,000 mcg tablet Take 1,000 mcg by mouth daily.     ??? cholecalciferol, VITAMIN D3, (VITAMIN D3) 5,000 unit tab tablet Take 5,000 Units by mouth daily.     ??? nystatin (MYCOSTATIN) topical cream Apply  to affected area two (2) times a day. 15 g 1   ??? alcohol swabs padm Use daily to test blood sugar  DX E11.9 100 Pad 1   ??? albuterol (PROVENTIL HFA, VENTOLIN HFA, PROAIR HFA) 90 mcg/actuation inhaler Take 1-2 Puffs by inhalation every four (4) hours as needed for Wheezing. (Patient not taking: Reported on 03/04/2020) 1 Inhaler 0     Allergies   Allergen Reactions   ??? Latex Rash   ??? Penicillins Itching   ??? Acetaminophen-Codeine Nausea Only and Other (comments)     Nausea and pains in stomach   ??? Butrans [Buprenorphine] Rash   ??? Codeine Other (comments)     Nausea and abd pain   ??? Iodine Rash   ??? Lactose Diarrhea     Lactose intolerance   ??? Other Plant, Animal, Environmental Hives     Conductive electrodes       Family History   Problem Relation Age of Onset   ??? Hypertension Mother    ???  Stroke Mother    ??? Diabetes Mother    ??? Thyroid Disease Mother    ??? Arthritis-rheumatoid Mother    ??? Cancer Father         colon polpe   ??? Cancer Maternal Aunt      Social History     Tobacco Use   ??? Smoking status: Never Smoker   ??? Smokeless tobacco: Never Used   Substance Use Topics   ??? Alcohol use: No     Alcohol/week: 0.0 standard drinks         Dalene Seltzer, MD       SUBJECTIVE  Ff-up    HTN- currently on lisinopril 20 mg BID  Hypokalemia- endo eval neg for  hyperaldo, recommending kcl 60 meqs + Mg. She is having difficulty taking K supplement.     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    DM-diet controlled, she does not check glucose at home. She is no longer on metformin.  a1c 5.5  ??  Asthma ?COPD- non smoker, denies cough, sob, wheezing. She says last albuterol use was a year ago. Usually heat triggers respiratory symptoms.      ROS:  See HPI, all others negative        Patient Active Problem List   Diagnosis Code   ??? 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 Type 2 diabetes mellitus without complication (HCC) K16.0   ??? Incontinence in female R32   ??? Candidal intertrigo B37.2   ??? 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 Essential hypertension I10   ??? COPD (chronic obstructive pulmonary disease) (HCC) J44.9   ??? Hyperlipidemia E78.5   ??? Vitamin D deficiency E55.9   ??? Internal hemorrhoids K64.8   ??? Colon polyps K63.5   ??? Hypokalemia E87.6   ??? Hypomagnesemia E83.42   ??? Syncope R55   ??? Elevated troponin R77.8   ??? 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 Hypocalcemia E83.51   ??? Mild intermittent asthma J45.20       Current Outpatient Medications   Medication Sig Dispense Refill   ??? potassium 99 mg tablet Take 99 mg by mouth daily.     ??? spironolactone (ALDACTONE) 25 mg tablet Take 1 Tablet by mouth daily. 90 Tablet 0   ??? lisinopriL (PRINIVIL, ZESTRIL) 20 mg tablet TAKE 1 TABLET TWICE DAILY 180 Tablet 1   ??? MAGNESIUM PO Take  by mouth.     ??? diclofenac (VOLTAREN) 1 % gel Apply 4 g to affected area four (4) times daily as needed for Pain. 1 Each 3   ??? ergocalciferol (ERGOCALCIFEROL) 1,250 mcg (50,000 unit) capsule Take 1 Cap by mouth every seven (7) days. 12 Cap 3   ??? calcium citrate 200 mg (950 mg) tablet Take 1 Tab by mouth daily. Indications: malabsorption 30 Tab 0   ??? multivitamin (ONE A DAY) tablet Take 1 Tab by mouth daily.     ??? thiamine HCL (B-1) 100 mg tablet Take   by mouth daily.     ??? cyanocobalamin 1,000 mcg tablet Take 1,000 mcg by mouth daily.     ??? cholecalciferol, VITAMIN D3, (VITAMIN D3) 5,000 unit tab tablet Take 5,000 Units by mouth daily.     ??? nystatin (MYCOSTATIN) topical cream Apply  to affected area two (2) times a day. 15 g 1   ??? alcohol swabs padm Use daily to test blood sugar  DX E11.9 100 Pad 1   ??? albuterol (PROVENTIL HFA, VENTOLIN HFA, PROAIR HFA) 90 mcg/actuation inhaler Take 1-2 Puffs by inhalation every four (4) hours as needed for Wheezing. (Patient not taking: Reported on 03/04/2020) 1 Inhaler 0       Allergies   Allergen Reactions   ??? Latex Rash   ??? Penicillins Itching   ??? Acetaminophen-Codeine Nausea Only and Other (comments)     Nausea and pains in stomach   ??? Butrans [Buprenorphine] Rash   ??? Codeine Other (comments)     Nausea and abd pain   ??? Iodine Rash   ??? Lactose Diarrhea     Lactose intolerance   ??? Other Plant, Animal, Environmental Hives     Conductive electrodes       Past Medical History:   Diagnosis Date   ??? Arthritis     knee, arms,    ??? Asthma    ??? Chronic obstructive pulmonary disease ([place])    ??? Chronic pain    ??? Diabetes ([place])    ??? H/O sinusitis    ??? Hypercholesterolemia    ??? Hypertension    ??? Sleep apnea     NOT USING    ??? Stress incontinence    ??? Toe fracture, left August 2015    4th toe       Social History     Socioeconomic History   ??? Marital status: SINGLE     Spouse name: Not on file   ??? Number of children: Not on file   ??? Years of education: Not on file   ??? Highest education level: Not on file   Occupational History   ??? Not on file   Tobacco Use   ??? Smoking status: Never Smoker   ??? Smokeless tobacco: Never Used   Substance and Sexual Activity   ??? Alcohol use: No     Alcohol/week: 0.0 standard drinks   ??? Drug use: No   ??? Sexual activity: Not Currently   Other Topics Concern   ??? 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 Denies history of smoking      Social  Determinants of Company secretary Strain:    ??? Difficulty of Paying Living Expenses:    Food Insecurity:    ??? Worried About Charity fundraiser in the Last Year:    ??? Arboriculturist in the Last Year:    Transportation Needs:    ??? Film/video editor (Medical):    ??? Lack of Transportation (Non-Medical):    Physical Activity:    ??? Days of Exercise per Week:    ??? Minutes of Exercise per Session:    Stress:    ??? Feeling of Stress :    Social Connections:    ??? Frequency of Communication with Friends and Family:    ??? Frequency of Social Gatherings with Friends and Family:    ??? Attends Religious Services:    ??? Marine scientist or Organizations:    ??? Attends Music therapist:    ??? Marital Status:    Intimate Production manager Violence:    ??? Fear of Current or Ex-Partner:    ??? Emotionally Abused:    ??? Physically Abused:    ??? Sexually Abused:        Family History   Problem Relation Age of Onset   ???  Hypertension Mother    ??? Stroke Mother    ??? Diabetes Mother    ??? Thyroid Disease Mother    ??? Arthritis-rheumatoid Mother    ??? Cancer Father         colon polpe   ??? Cancer Maternal Aunt          OBJECTIVE    Physical Exam:     Visit Vitals  BP (!) 160/90 (BP 1 Location: Left upper arm, BP Patient Position: Sitting, BP Cuff Size: Adult)   Pulse 73   Temp 97.7 ??F (36.5 ??C) (Temporal)   Resp 16   Ht 5' 3"  (1.6 m)   Wt 183 lb 3.2 oz (83.1 kg)   SpO2 99%   BMI 32.45 kg/m??       General: alert, well-appearing,obese,AA, in no apparent distress or pain  Neck: supple, no adenopathy palpated  CVS: normal rate, regular rhythm, distinct S1 and S2  Lungs:clear to ausculation bilaterally, no crackles, wheezing or rhonchi noted  Abdomen: normoactive bowel sounds, soft, non-tender  Extremities: no edema, no cyanosis, MSK grossly normal  Skin: warm, no lesions, rashes noted  Psych:  mood and affect normal  Results for orders placed or performed during the hospital encounter of 02/26/20   HEMOGLOBIN A1C WITH EAG   Result  Value Ref Range    Hemoglobin A1c 5.5 4.2 - 5.6 %    Est. average glucose 270 mg/dL   METABOLIC PANEL, COMPREHENSIVE   Result Value Ref Range    Sodium 146 (H) 136 - 145 mmol/L    Potassium 3.3 (L) 3.5 - 5.5 mmol/L    Chloride 110 100 - 111 mmol/L    CO2 31 21 - 32 mmol/L    Anion gap 5 3.0 - 18 mmol/L    Glucose 87 74 - 99 mg/dL    BUN 9 7.0 - 18 MG/DL    Creatinine 0.57 (L) 0.6 - 1.3 MG/DL    BUN/Creatinine ratio 16 12 - 20      GFR est AA >60 >60 ml/min/1.68m    GFR est non-AA >60 >60 ml/min/1.754m   Calcium 8.5 8.5 - 10.1 MG/DL    Bilirubin, total 0.7 0.2 - 1.0 MG/DL    ALT (SGPT) 26 13 - 56 U/L    AST (SGOT) 18 10 - 38 U/L    Alk. phosphatase 83 45 - 117 U/L    Protein, total 6.3 (L) 6.4 - 8.2 g/dL    Albumin 3.6 3.4 - 5.0 g/dL    Globulin 2.7 2.0 - 4.0 g/dL    A-G Ratio 1.3 0.8 - 1.7     MICROALBUMIN, UR, RAND W/ MICROALB/CREAT RATIO   Result Value Ref Range    Microalbumin,urine random 1.47 0 - 3.0 MG/DL    Creatinine, urine 184.00 (H) 30 - 125 mg/dL    Microalbumin/Creat ratio (mg/g creat) 8 0 - 30 mg/g           ASSESSMENT/PLAN  Diagnoses and all orders for this visit:     Essential hypertension  Needs better control, w/ hypoKalemia issues  Cont  lisinopril to 20 mg bid  Start aldactone 25 mg  DASH diet, BP Log  BMP next week  Ff-up in 4 weeks  ??  Diabetes mellitus type 2, diet-controlled (HCLee Off meds after gastric bypass 10/19  Monitoring      Mild intermittent asthma, unspecified whether complicated  Controlled  Cont prn albuterol  ??flu vaccine given     Pure  hypercholesterolemia   off statin after wt loss/gastric bypass 2019  Monitoring, satisfactory lipids 07/2019     Chronic obstructive pulmonary disease, unspecified COPD type ([place])  Unclear h/o, nonsmoker  pt reports recent pulm clearance prior to gastric bypass ?[facility]  Cont prn albuterol    BMI 32  S/p gastric bypass    Hypokalemia  Difficulty taking supploement  Discussed options, agreed to add aldactone for BP control and hypokalemia.  STOP- kcl  60 meqs daily\\  monitoring  hyperaldo eval neg    Vitamin D deficiency  Improving, s/p gastric bypass  Cont 50 k weekly  Monitoring    S/p gastric bypass      Follow-up and Dispositions    ?? Return in about 4 weeks (around 04/01/2020), or if symptoms worsen or fail to improve, for LABS IN 1 WEEK BP recheck.           Patient understands plan of care. Patient has provided input and agrees with goals.

## 2020-03-04 NOTE — Progress Notes (Signed)
1. "Have you been to the ER, urgent care clinic since your last visit?  Hospitalized since your last visit?" No    2. "Have you seen or consulted any other health care providers outside of the [facility] since your last visit?" 02/2020 Dr. Reed Pandy Dm eye exam.    3. For patients aged 68-75: Has the patient had a colonoscopy? Yes, HM satisfied with blue hyperlink Negative FIT test 10/26/19    If the patient is female:    4. For patients aged 71-74: Has the patient had a mammogram within the past 2 years? Yes, HM satisfied with blue hyperlink 07/22/19    5. For patients aged 21-65: Has the patient had a pap smear? No

## 2020-03-20 ENCOUNTER — Encounter: Attending: Family Medicine | Primary: Family Medicine

## 2020-03-29 ENCOUNTER — Inpatient Hospital Stay: Admit: 2020-03-29 | Payer: MEDICARE | Primary: Family Medicine

## 2020-03-29 DIAGNOSIS — E876 Hypokalemia: Secondary | ICD-10-CM

## 2020-03-29 LAB — BASIC METABOLIC PANEL
Anion Gap: 9 mmol/L (ref 3.0–18)
BUN: 12 MG/DL (ref 7.0–18)
Bun/Cre Ratio: 22 — ABNORMAL HIGH (ref 12–20)
CO2: 26 mmol/L (ref 21–32)
Calcium: 8.6 MG/DL (ref 8.5–10.1)
Chloride: 110 mmol/L (ref 100–111)
Creatinine: 0.54 MG/DL — ABNORMAL LOW (ref 0.6–1.3)
EGFR IF NonAfrican American: >60 mL/min/{1.73_m2} (ref >60)
GFR African American: >60 mL/min/{1.73_m2} (ref >60)
Glucose: 91 mg/dL (ref 74–99)
Potassium: 3.3 mmol/L — ABNORMAL LOW (ref 3.5–5.5)
Sodium: 145 mmol/L (ref 136–145)

## 2020-03-29 LAB — METABOLIC PANEL, BASIC
Anion gap: 9 mmol/L (ref 3.0–18)
BUN/Creatinine ratio: 22 — ABNORMAL HIGH (ref 12–20)
BUN: 12 MG/DL (ref 7.0–18)
CO2: 26 mmol/L (ref 21–32)
Calcium: 8.6 MG/DL (ref 8.5–10.1)
Chloride: 110 mmol/L (ref 100–111)
Creatinine: 0.54 MG/DL — ABNORMAL LOW (ref 0.6–1.3)
GFR est AA: >60 mL/min/{1.73_m2} (ref >60)
GFR est non-AA: >60 mL/min/{1.73_m2} (ref >60)
Glucose: 91 mg/dL (ref 74–99)
Potassium: 3.3 mmol/L — ABNORMAL LOW (ref 3.5–5.5)
Sodium: 145 mmol/L (ref 136–145)

## 2020-04-01 ENCOUNTER — Ambulatory Visit: Attending: Family Medicine | Primary: Family Medicine

## 2020-04-01 ENCOUNTER — Ambulatory Visit: Admit: 2020-04-01 | Discharge: 2020-04-01 | Payer: MEDICARE | Attending: Family Medicine | Primary: Family Medicine

## 2020-04-01 DIAGNOSIS — I10 Essential (primary) hypertension: Secondary | ICD-10-CM

## 2020-04-01 MED ORDER — CYCLOBENZAPRINE 5 MG TAB
5 mg | ORAL_TABLET | Freq: Every evening | ORAL | 0 refills | Status: DC
Start: 2020-04-01 — End: 2020-07-02

## 2020-04-01 NOTE — Progress Notes (Signed)
SUBJECTIVE  Ff-up    HTN- currently on aldactone 25 mg, with improved PB readings. Persistent Hypokalemia- endo eval neg for hyperaldo,  She was on  recommending kcl 60 meqs + Mg.     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    DM-diet controlled, she does not check glucose at home. She is no longer on metformin.  a1c 5.5  ??  Asthma ?COPD- non smoker, denies cough, sob, wheezing. She says last albuterol use was a year ago. Usually heat triggers respiratory symptoms.    Chronic bilateral knee pain- known OA, reports past few weeks with lateral thigh pain both legs, esp at night. No injury, swelling. Says had received multiple viscosupplementation/steroid shots with ortho, 2 years ago prior to gastric bypass, was on chronic narcotic pain medications, following pain management.  She has been applying prn voltaren gel with some relief.       ROS:  See HPI, all others negative        Patient Active Problem List   Diagnosis Code   ??? 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 Type 2 diabetes mellitus without complication (HCC) E11.9   ??? Incontinence in female R32   ??? Candidal intertrigo B37.2   ??? 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 Essential hypertension I10   ??? COPD (chronic obstructive pulmonary disease) (HCC) J44.9   ??? Hyperlipidemia E78.5   ??? Vitamin D deficiency E55.9   ??? Internal hemorrhoids K64.8   ??? Colon polyps K63.5   ??? Hypokalemia E87.6   ??? Hypomagnesemia E83.42   ??? Syncope R55   ??? Elevated troponin R77.8   ??? 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 Hypocalcemia E83.51   ??? Mild intermittent asthma J45.20       Current Outpatient Medications   Medication Sig Dispense Refill   ??? cyclobenzaprine (FLEXERIL) 5 mg tablet Take 1 Tablet by mouth nightly. 90 Tablet 0   ??? spironolactone (ALDACTONE) 25 mg tablet Take 1 Tablet by mouth daily. 90 Tablet 0   ??? MAGNESIUM PO Take  by mouth.     ??? ergocalciferol  (ERGOCALCIFEROL) 1,250 mcg (50,000 unit) capsule Take 1 Cap by mouth every seven (7) days. 12 Cap 3   ??? calcium citrate 200 mg (950 mg) tablet Take 1 Tab by mouth daily. Indications: malabsorption 30 Tab 0   ??? multivitamin (ONE A DAY) tablet Take 1 Tab by mouth daily.     ??? thiamine HCL (B-1) 100 mg tablet Take  by mouth daily.     ??? cyanocobalamin 1,000 mcg tablet Take 1,000 mcg by mouth daily.     ??? cholecalciferol, VITAMIN D3, (VITAMIN D3) 5,000 unit tab tablet Take 5,000 Units by mouth daily.     ??? nystatin (MYCOSTATIN) topical cream Apply  to affected area two (2) times a day. 15 g 1   ??? diclofenac (VOLTAREN) 1 % gel Apply 4 g to affected area four (4) times daily as needed for Pain. 1 Each 3   ??? albuterol (PROVENTIL HFA, VENTOLIN HFA, PROAIR HFA) 90 mcg/actuation inhaler Take 1-2 Puffs by inhalation every four (4) hours as needed for Wheezing. (Patient not taking: Reported on 03/04/2020) 1 Inhaler 0   ??? alcohol swabs padm Use daily to test blood sugar  DX E11.9 (Patient not taking: Reported on 04/01/2020) 100 Pad 1       Allergies   Allergen Reactions   ???  Latex Rash   ??? Penicillins Itching   ??? Acetaminophen-Codeine Nausea Only and Other (comments)     Nausea and pains in stomach   ??? Butrans [Buprenorphine] Rash   ??? Codeine Other (comments)     Nausea and abd pain   ??? Iodine Rash   ??? Lactose Diarrhea     Lactose intolerance   ??? Other Plant, Animal, Environmental Hives     Conductive electrodes       Past Medical History:   Diagnosis Date   ??? Arthritis     knee, arms,    ??? Asthma    ??? Chronic obstructive pulmonary disease (HCC)    ??? Chronic pain    ??? Diabetes (HCC)    ??? H/O sinusitis    ??? Hypercholesterolemia    ??? Hypertension    ??? Sleep apnea     NOT USING    ??? Stress incontinence    ??? Toe fracture, left August 2015    4th toe       Social History     Socioeconomic History   ??? Marital status: SINGLE     Spouse name: Not on file   ??? Number of children: Not on file   ??? Years of education: Not on file   ??? Highest  education level: Not on file   Occupational History   ??? Not on file   Tobacco Use   ??? Smoking status: Never Smoker   ??? Smokeless tobacco: Never Used   Substance and Sexual Activity   ??? Alcohol use: No     Alcohol/week: 0.0 standard drinks   ??? Drug use: No   ??? Sexual activity: Not Currently   Other Topics Concern   ??? 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 Denies history of smoking      Social Determinants of Psychologist, prison and probation services Strain:    ??? Difficulty of Paying Living Expenses: Not on file   Food Insecurity:    ??? Worried About Programme researcher, broadcasting/film/video in the Last Year: Not on file   ??? Ran Out of Food in the Last Year: Not on file   Transportation Needs:    ??? Lack of Transportation (Medical): Not on file   ??? Lack of Transportation (Non-Medical): Not on file   Physical Activity:    ??? Days of Exercise per Week: Not on file   ??? Minutes of Exercise per Session: Not on file   Stress:    ??? Feeling of Stress : Not on file   Social Connections:    ??? Frequency of Communication with Friends and Family: Not on file   ??? Frequency of Social Gatherings with Friends and Family: Not on file   ??? Attends Religious Services: Not on file   ??? Active Member of Clubs or Organizations: Not on file   ??? Attends Banker Meetings: Not on file   ??? Marital Status: Not on file   Intimate Partner Violence:    ??? Fear of Current or Ex-Partner: Not on file   ??? Emotionally Abused: Not on file   ??? Physically Abused: Not on file   ??? Sexually Abused: Not on file   Housing Stability:    ??? Unable to Pay for Housing in the Last Year: Not on file   ??? Number of Places Lived in the Last Year: Not on file   ???  Unstable Housing in the Last Year: Not on file       Family History   Problem Relation Age of Onset   ??? Hypertension Mother    ??? Stroke Mother    ??? Diabetes Mother    ??? Thyroid Disease Mother    ??? Arthritis-rheumatoid Mother    ??? Cancer Father         colon polpe   ???  Cancer Maternal Aunt          OBJECTIVE    Physical Exam:     Visit Vitals  BP 136/88 (BP 1 Location: Left upper arm, BP Patient Position: Sitting, BP Cuff Size: Adult)   Pulse (!) 51   Temp 97.4 ??F (36.3 ??C) (Oral)   Resp 16   Ht 5\' 3"  (1.6 m)   Wt 182 lb (82.6 kg)   SpO2 97%   BMI 32.24 kg/m??       General: alert, well-appearing,obese,AA, in no apparent distress or pain  Neck: supple, no adenopathy palpated  CVS: normal rate, regular rhythm, distinct S1 and S2  Lungs:clear to ausculation bilaterally, no crackles, wheezing or rhonchi noted  Abdomen: normoactive bowel sounds, soft, non-tender  Extremities: no tenderness on thighs, no bruising or discoloration or swelling. FROM LE, knees FROM no effusion  Skin: warm, no lesions, rashes noted  Psych:  mood and affect normal  Results for orders placed or performed during the hospital encounter of 03/29/20   METABOLIC PANEL, BASIC   Result Value Ref Range    Sodium 145 136 - 145 mmol/L    Potassium 3.3 (L) 3.5 - 5.5 mmol/L    Chloride 110 100 - 111 mmol/L    CO2 26 21 - 32 mmol/L    Anion gap 9 3.0 - 18 mmol/L    Glucose 91 74 - 99 mg/dL    BUN 12 7.0 - 18 MG/DL    Creatinine 13/12/21 (L) 0.6 - 1.3 MG/DL    BUN/Creatinine ratio 22 (H) 12 - 20      GFR est AA >60 >60 ml/min/1.34m2    GFR est non-AA >60 >60 ml/min/1.39m2    Calcium 8.6 8.5 - 10.1 MG/DL           ASSESSMENT/PLAN  Diagnoses and all orders for this visit:     Essential hypertension  Improved   w/ hypoKalemia issues  Cont aldactone 25 mg  Resume 20 meqs KCL only (instead of 60 mg)  DASH diet, BP Log  BMP/mg in 4 weeks  ??  Diabetes mellitus type 2, diet-controlled (HCC)  Off meds after gastric bypass 10/19  Monitoring      Mild intermittent asthma, unspecified whether complicated  Controlled  Cont prn albuterol     Pure hypercholesterolemia   off statin after wt loss/gastric bypass 2019  Monitoring, satisfactory lipids 07/2019     Chronic obstructive pulmonary disease, unspecified COPD type (HCC)  Unclear h/o,  nonsmoker  pt reports recent pulm clearance prior to gastric bypass ?[facility]  Cont prn albuterol    BMI 32  S/p gastric bypass    Vitamin D deficiency  Improving, s/p gastric bypass  Cont 50 k weekly  Monitoring    S/p gastric bypass     Chronic pain of both knees  Avoiding nsaids with hx gastric bypass  Add flexeril for lateral thigh pain, ITBS HEP provided  Refer back to ortho dr. 08/2019-     REFERRAL TO ORTHOPEDICS    Hypokalemia  -  METABOLIC PANEL, BASIC; Future  -     MAGNESIUM; Future    Other orders  -     cyclobenzaprine (FLEXERIL) 5 mg tablet; Take 1 Tablet by mouth nightly.      Ff-up in 3 months,   Labs in 4 weeks.     Patient understands plan of care. Patient has provided input and agrees with goals.

## 2020-04-01 NOTE — Progress Notes (Signed)
1. Have you been to the ER, urgent care clinic since your last visit?  Hospitalized since your last visit?No    2. Have you seen or consulted any other health care providers outside of the [facility] since your last visit?  Include any pap smears or colon screening. No

## 2020-05-01 ENCOUNTER — Encounter

## 2020-05-01 ENCOUNTER — Inpatient Hospital Stay: Admit: 2020-05-01 | Payer: MEDICARE | Primary: Family Medicine

## 2020-05-01 DIAGNOSIS — E876 Hypokalemia: Secondary | ICD-10-CM

## 2020-05-01 LAB — BASIC METABOLIC PANEL
Anion Gap: 4 mmol/L (ref 3.0–18)
BUN: 11 MG/DL (ref 7.0–18)
Bun/Cre Ratio: 20 (ref 12–20)
CO2: 33 mmol/L — ABNORMAL HIGH (ref 21–32)
Calcium: 8.5 MG/DL (ref 8.5–10.1)
Chloride: 108 mmol/L (ref 100–111)
Creatinine: 0.54 MG/DL — ABNORMAL LOW (ref 0.6–1.3)
EGFR IF NonAfrican American: >60 mL/min/{1.73_m2} (ref >60)
GFR African American: >60 mL/min/{1.73_m2} (ref >60)
Glucose: 91 mg/dL (ref 74–99)
Potassium: 2.8 mmol/L — CL (ref 3.5–5.5)
Sodium: 145 mmol/L (ref 136–145)

## 2020-05-01 LAB — MAGNESIUM
Magnesium: 1.5 mg/dL — ABNORMAL LOW (ref 1.6–2.6)
Magnesium: 1.5 mg/dL — ABNORMAL LOW (ref 1.6–2.6)

## 2020-05-01 LAB — METABOLIC PANEL, BASIC
Anion gap: 4 mmol/L (ref 3.0–18)
BUN/Creatinine ratio: 20 (ref 12–20)
BUN: 11 MG/DL (ref 7.0–18)
CO2: 33 mmol/L — ABNORMAL HIGH (ref 21–32)
Calcium: 8.5 MG/DL (ref 8.5–10.1)
Chloride: 108 mmol/L (ref 100–111)
Creatinine: 0.54 MG/DL — ABNORMAL LOW (ref 0.6–1.3)
GFR est AA: >60 mL/min/{1.73_m2} (ref >60)
GFR est non-AA: >60 mL/min/{1.73_m2} (ref >60)
Glucose: 91 mg/dL (ref 74–99)
Potassium: 2.8 mmol/L — CL (ref 3.5–5.5)
Sodium: 145 mmol/L (ref 136–145)

## 2020-05-01 MED ORDER — MAGNESIUM OXIDE 400 MG TAB
400 mg | ORAL_TABLET | Freq: Every day | ORAL | 0 refills | Status: AC
Start: 2020-05-01 — End: ?

## 2020-05-01 MED ORDER — POTASSIUM CHLORIDE 20 % ORAL LIQUID
40 mEq/15 mL | Freq: Every day | ORAL | 0 refills | Status: DC
Start: 2020-05-01 — End: 2020-05-02

## 2020-05-01 NOTE — Progress Notes (Signed)
Low magnesium and K levels. Continue aldactone 25 mg  How much Kcl is she taking? If none, then take 40 meqs daily and will add mg  Recheck labs on Monday  If she is taking kcl 20 meqs, go up to 60 meqs like before. Plus mg  Needs to be called TODAY, lab order placed. I just need to know how much to give, thanks

## 2020-05-01 NOTE — Progress Notes (Signed)
Left patient a detailed message that she should call me back today regarding her lab results. Left message at 3 pm 05/01/2020

## 2020-05-01 NOTE — Progress Notes (Signed)
Liquid kcl 40 meqs daily given  Recheck bmp/mg on Friday stat instead (to avoid weekend calls)  Pls notify pt  Thanks!

## 2020-05-01 NOTE — Progress Notes (Signed)
Follow up with STAT lab on Friday

## 2020-05-01 NOTE — Progress Notes (Signed)
Patient is aware of liquid potassium.   Patient aware to repeat labs on Friday.

## 2020-05-02 MED ORDER — POTASSIUM CHLORIDE SR 20 MEQ TAB, PARTICLES/CRYSTALS
20 mEq | ORAL_TABLET | Freq: Every day | ORAL | 1 refills | Status: DC
Start: 2020-05-02 — End: 2020-07-02

## 2020-05-02 NOTE — Telephone Encounter (Signed)
Patient identified with 2 identifiers (name and DOB).  Spoke with patient and discussed how important taking the potassium. Asked patient if she could try to swallow the pills. Patient agreed to try. Dr. Sharmon Revere aware. Potassium pills have been e scribed to her pharmacy.

## 2020-05-21 NOTE — Telephone Encounter (Signed)
Wrong chart

## 2020-05-21 NOTE — Telephone Encounter (Signed)
 This pharmacy faxed over request for the following prescriptions to be filled:    Medication requested :   Requested Prescriptions     Pending Prescriptions Disp Refills   . spironolactone  (ALDACTONE ) 25 mg tablet 90 Tablet 0     Sig: Take 1 Tablet by mouth daily.   . ergocalciferol  (ERGOCALCIFEROL ) 1,250 mcg (50,000 unit) capsule 12 Capsule 3     Sig: Take 1 Capsule by mouth every seven (7) days.   . diclofenac (VOLTAREN) 1 % gel 1 Each 3     Sig: Apply 4 g to affected area four (4) times daily as needed for Pain.         PCP: Sherryll  Pharmacy or Print: [facility]   Mail order or Local pharmacy Mail Order     Scheduled appointment if not seen by current providers in office: LOV 04/01/2020 f/u 07/02/2020

## 2020-05-22 MED ORDER — ERGOCALCIFEROL (VITAMIN D2) 50,000 UNIT CAP
1250 mcg (50,000 unit) | ORAL_CAPSULE | ORAL | 3 refills | Status: DC
Start: 2020-05-22 — End: 2021-05-05

## 2020-05-22 MED ORDER — DICLOFENAC 1 % TOPICAL GEL
1 % | Freq: Four times a day (QID) | CUTANEOUS | 3 refills | Status: DC | PRN
Start: 2020-05-22 — End: 2020-10-29

## 2020-05-22 MED ORDER — SPIRONOLACTONE 25 MG TAB
25 mg | ORAL_TABLET | Freq: Every day | ORAL | 0 refills | Status: DC
Start: 2020-05-22 — End: 2020-07-29

## 2020-05-22 NOTE — Telephone Encounter (Signed)
Overdue for repeat BMP SOON! Was very low last time, pls remind of standing lab order

## 2020-05-24 NOTE — Telephone Encounter (Signed)
Spoke with Chrisman at [employer]. He was advised that patient has never been prescribed Spironolactone HCTZ by this office. [facility] to dispense Spironolactone 25 mg as prescribed by Dr. Sharmon Revere.

## 2020-05-24 NOTE — Telephone Encounter (Signed)
Pharmacist Barnabas Harries from [facility] in and stated that the patient requested a prescription for   Spironolactone w/hydrochlorithiazide   Pharmacist is  Checking to see if this is an error as the patient has not received this medication prior.

## 2020-06-05 ENCOUNTER — Encounter: Attending: Orthopaedic Surgery | Primary: Family Medicine

## 2020-06-20 NOTE — Telephone Encounter (Signed)
Spoke with patient and she is aware to have lab work done [employer]. Due to low potassium

## 2020-06-25 ENCOUNTER — Inpatient Hospital Stay: Admit: 2020-06-25 | Payer: MEDICARE | Primary: Family Medicine

## 2020-06-25 DIAGNOSIS — E876 Hypokalemia: Secondary | ICD-10-CM

## 2020-06-25 LAB — BASIC METABOLIC PANEL
Anion Gap: 5 mmol/L (ref 3.0–18)
BUN: 15 MG/DL (ref 7.0–18)
Bun/Cre Ratio: 22 — ABNORMAL HIGH (ref 12–20)
CO2: 28 mmol/L (ref 21–32)
Calcium: 9.2 MG/DL (ref 8.5–10.1)
Chloride: 109 mmol/L (ref 100–111)
Creatinine: 0.67 MG/DL (ref 0.6–1.3)
EGFR IF NonAfrican American: >60 mL/min/{1.73_m2} (ref >60)
GFR African American: >60 mL/min/{1.73_m2} (ref >60)
Glucose: 104 mg/dL — ABNORMAL HIGH (ref 74–99)
Potassium: 4.6 mmol/L (ref 3.5–5.5)
Sodium: 142 mmol/L (ref 136–145)

## 2020-06-25 LAB — METABOLIC PANEL, BASIC
Anion gap: 5 mmol/L (ref 3.0–18)
BUN/Creatinine ratio: 22 — ABNORMAL HIGH (ref 12–20)
BUN: 15 MG/DL (ref 7.0–18)
CO2: 28 mmol/L (ref 21–32)
Calcium: 9.2 MG/DL (ref 8.5–10.1)
Chloride: 109 mmol/L (ref 100–111)
Creatinine: 0.67 MG/DL (ref 0.6–1.3)
GFR est AA: >60 mL/min/{1.73_m2} (ref >60)
GFR est non-AA: >60 mL/min/{1.73_m2} (ref >60)
Glucose: 104 mg/dL — ABNORMAL HIGH (ref 74–99)
Potassium: 4.6 mmol/L (ref 3.5–5.5)
Sodium: 142 mmol/L (ref 136–145)

## 2020-06-25 NOTE — Progress Notes (Signed)
K back to normal, to be discussed on visit 2/15

## 2020-06-26 LAB — MAGNESIUM
Magnesium: 1.7 mg/dL (ref 1.6–2.6)
Magnesium: 1.7 mg/dL (ref 1.6–2.6)

## 2020-06-27 ENCOUNTER — Ambulatory Visit: Attending: Orthopaedic Surgery | Primary: Family Medicine

## 2020-06-27 ENCOUNTER — Ambulatory Visit: Admit: 2020-06-27 | Discharge: 2020-06-27 | Payer: MEDICARE | Attending: Orthopaedic Surgery | Primary: Family Medicine

## 2020-06-27 DIAGNOSIS — M25559 Pain in unspecified hip: Secondary | ICD-10-CM

## 2020-06-27 NOTE — Progress Notes (Signed)
Progress Notes by Gifford Shave, MD at 06/27/20 0745                Author: Gifford Shave, MD  Service: --  Author Type: Physician       Filed: 06/27/20 0826  Encounter Date: 06/27/2020  Status: Signed          Editor: Gifford Shave, MD (Physician)                             Patient: Cheryl Paul                MRN:  [id number]       SSN: [id number]   Date of Birth: 1951/02/15         AGE: 70 y.o.        SEX:  female   Body mass index is 32.59 kg/m??.      PCP: Mable Fill, MD   06/27/20      Yetta Barre presents today with bilateral knee pain and some right-sided hip pain the left knee is the stiffness 1 that she describes is been ongoing for a long time over 10 years she has had gastric bypass she was previously in pain management she did not  like it and did not want to be on narcotics she is interested in having surgery she has limited walking tolerance of less than 10 minutes night pain is a major feature wakes her up many times at night she has to rub the Voltaren cream on and some mild  difficulty putting shoes and socks on but not a major feature she denies shortness of breath or chest pain and a lot of health issues of really improved for after the gastric bypass she has had some lower counseling some of her electrolytes some family  medicine is working on this with her      The examination today she walks with a bilateral antalgic gait onto the knee she is in varus on both sides just mild Trendelenburg gait the antalgic gait is more to the knees and the hips and the actually the both hips actually rotate fairly well the  right hip is a little bit stiff with internal rotation but not very symptomatic the left knee is has at least a 5 to 6 degree fixed flexion deformity and ankylosis and flexion only bending to 90 degrees a fairly significant Baker's cyst calf nontender  Homans' sign is negative there is no evidence of cyanosis or clubbing and she appears younger than her stated age the right  knee is similar but a little less severe.      Mild joint line tenderness in all 3 compartments skin is normal good pulses just mild effusions in the knees      X-rays today June 27, 2020 AP pelvis and 4 views of both knees confirms end-stage arthritis of both knees and moderate arthritis of the right hip with a medial wear pattern      Otherwise a discussion regarding surgery was decided that it is recommended start with the left knee which is the most stiff than the right knee about 6 months later and hopefully we can get a few more years out of the right hip.      Risks and benefits described including but not limited to infection DVT pulmonary embolism anesthetic complications blood loss requiring transfusion chronic pain instability implant longevity arthrofibrosis and also the  need for bending and straightening  knee on an hourly basis to avoid complications      REVIEW OF SYSTEMS:        CON: negative   EYE: negative    ENT: negative   RESP: negative   GI:    negative    GU:  negative   MSK: Positive   A twelve point review of systems was completed, positives noted and all other systems were reviewed and are negative                 Past Medical History:        Diagnosis  Date         ?  Arthritis            knee, arms,          ?  Asthma       ?  Chronic obstructive pulmonary disease (HCC)       ?  Chronic pain       ?  Diabetes (HCC)       ?  H/O sinusitis       ?  Hypercholesterolemia       ?  Hypertension       ?  Sleep apnea            NOT USING          ?  Stress incontinence       ?  Toe fracture, left  August 2015          4th toe             Family History         Problem  Relation  Age of Onset          ?  Hypertension  Mother       ?  Stroke  Mother       ?  Diabetes  Mother       ?  Thyroid Disease  Mother       ?  Arthritis-rheumatoid  Mother       ?  Cancer  Father                colon polpe          ?  Cancer  Maternal Aunt               Current Outpatient Medications          Medication   Sig  Dispense  Refill           ?  ergocalciferol (ERGOCALCIFEROL) 1,250 mcg (50,000 unit) capsule  Take 1 Capsule by mouth every seven (7) days.  12 Capsule  3     ?  diclofenac (VOLTAREN) 1 % gel  Apply 4 g to affected area four (4) times daily as needed for Pain.  1 Each  3     ?  potassium chloride (K-DUR, KLOR-CON M20) 20 mEq tablet  Take 2 Tablets by mouth daily.  60 Tablet  1     ?  magnesium oxide (MAG-OX) 400 mg tablet  Take 1 Tablet by mouth daily.  90 Tablet  0     ?  MAGNESIUM PO  Take  by mouth.         ?  calcium citrate 200 mg (950 mg) tablet  Take 1 Tab by mouth daily. Indications: malabsorption  30 Tab  0           ?  multivitamin (ONE A DAY) tablet  Take 1 Tab by mouth daily.               ?  thiamine HCL (B-1) 100 mg tablet  Take  by mouth daily.         ?  cyanocobalamin 1,000 mcg tablet  Take 1,000 mcg by mouth daily.         ?  cholecalciferol, VITAMIN D3, (VITAMIN D3) 5,000 unit tab tablet  Take 5,000 Units by mouth daily.         ?  nystatin (MYCOSTATIN) topical cream  Apply  to affected area two (2) times a day.  15 g  1     ?  alcohol swabs padm  Use daily to test blood sugar  DX E11.9  100 Pad  1     ?  spironolactone (ALDACTONE) 25 mg tablet  Take 1 Tablet by mouth daily. (Patient not taking: Reported on 06/27/2020)  90 Tablet  0     ?  cyclobenzaprine (FLEXERIL) 5 mg tablet  Take 1 Tablet by mouth nightly. (Patient not taking: Reported on 06/27/2020)  90 Tablet  0           ?  albuterol (PROVENTIL HFA, VENTOLIN HFA, PROAIR HFA) 90 mcg/actuation inhaler  Take 1-2 Puffs by inhalation every four (4) hours as needed for Wheezing. (Patient not taking: Reported on 03/04/2020)  1 Inhaler  0             Allergies        Allergen  Reactions         ?  Latex  Rash     ?  Penicillins  Itching     ?  Acetaminophen-Codeine  Nausea Only and Other (comments)             Nausea and pains in stomach         ?  Butrans [Buprenorphine]  Rash     ?  Codeine  Other (comments)             Nausea and abd pain          ?  Iodine  Rash     ?  Lactose  Diarrhea             Lactose intolerance         ?  Other Plant, Educational psychologist, Environmental  Hives             Conductive electrodes             Past Surgical History:         Procedure  Laterality  Date          ?  HX CARPAL TUNNEL RELEASE  Bilateral       ?  HX CHOLECYSTECTOMY         ?  HX GYN         ?  HX HERNIA REPAIR              abdomen          ?  HX HYSTERECTOMY              tubes tied          ?  HX LAP GASTRIC BYPASS    02/21/2018     ?  HX ORTHOPAEDIC              catrpal tunnel right and left          ?  HX POLYPECTOMY    04/18/14     ?  PR ABDOMEN SURGERY PROC UNLISTED              4 surguries for blockages             Social History          Socioeconomic History         ?  Marital status:  SINGLE              Spouse name:  Not on file         ?  Number of children:  Not on file     ?  Years of education:  Not on file     ?  Highest education level:  Not on file       Occupational History        ?  Not on file       Tobacco Use         ?  Smoking status:  Never Smoker     ?  Smokeless tobacco:  Never Used       Substance and Sexual Activity         ?  Alcohol use:  No              Alcohol/week:  0.0 standard drinks         ?  Drug use:  No     ?  Sexual activity:  Not Currently        Other Topics  Concern        ?  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 Denies history of smoking           Social Determinants of Health          Financial Resource Strain:         ?  Difficulty of Paying Living Expenses: Not on file       Food Insecurity:         ?  Worried About Running Out of Food in the Last Year: Not on file     ?  Ran Out of Food in the Last Year: Not on file       Transportation Needs:         ?  Lack of Transportation (Medical): Not on file     ?  Lack of Transportation (Non-Medical): Not on file       Physical Activity:         ?  Days of Exercise per Week: Not on file     ?   Minutes of Exercise per Session: Not on file       Stress:         ?  Feeling of Stress : Not on file       Social Connections:         ?  Frequency of Communication with Friends and Family: Not on file     ?  Frequency of Social Gatherings with Friends and Family: Not on file     ?  Attends Religious Services: Not on file     ?  Active Member of Clubs or Organizations: Not on file     ?  Attends BankerClub or Organization Meetings: Not on file     ?  Marital Status: Not  on file       Intimate Partner Violence:         ?  Fear of Current or Ex-Partner: Not on file     ?  Emotionally Abused: Not on file     ?  Physically Abused: Not on file     ?  Sexually Abused: Not on file       Housing Stability:         ?  Unable to Pay for Housing in the Last Year: Not on file     ?  Number of Places Lived in the Last Year: Not on file        ?  Unstable Housing in the Last Year: Not on file           Visit Vitals      Pulse  71     Temp  97.5 ??F (36.4 ??C) (Temporal)     Ht  5\' 3"  (1.6 m)     Wt  83.5 kg (184 lb)     SpO2  100%        BMI  32.59 kg/m??              PHYSICAL EXAMINATION:   GENERAL: Alert and oriented x3, in no acute distress, well-developed, well-nourished, afebrile.   HEART: No JVD.   EYES: No scleral icterus    NECK: No significant lymphadenopathy    LUNGS: No respiratory compromise or indrawing   ABDOMEN: Soft, non-tender, non-distended.             Note: This note was completed using voice recognition software. Any typographical/name errors or mistakes are unintentional.      Electronically signed by: , MD

## 2020-07-01 ENCOUNTER — Encounter

## 2020-07-02 ENCOUNTER — Ambulatory Visit: Attending: Family Medicine | Primary: Family Medicine

## 2020-07-02 ENCOUNTER — Ambulatory Visit: Admit: 2020-07-02 | Discharge: 2020-07-02 | Payer: MEDICARE | Attending: Family Medicine | Primary: Family Medicine

## 2020-07-02 DIAGNOSIS — I10 Essential (primary) hypertension: Secondary | ICD-10-CM

## 2020-07-02 MED ORDER — POTASSIUM CHLORIDE SR 20 MEQ TAB, PARTICLES/CRYSTALS
20 mEq | ORAL_TABLET | Freq: Every day | ORAL | 1 refills | Status: DC
Start: 2020-07-02 — End: 2020-08-20

## 2020-07-02 MED ORDER — NYSTATIN 100,000 UNIT/G TOPICAL CREAM
100000 unit/gram | Freq: Two times a day (BID) | CUTANEOUS | 1 refills | Status: AC
Start: 2020-07-02 — End: ?

## 2020-07-02 NOTE — Progress Notes (Signed)
 1. Have you been to the ER, urgent care clinic since your last visit?  Hospitalized since your last visit? No    2. Have you seen or consulted any other health care providers outside of the [facility] since your last visit?  Include any pap smears or colon screening. No    Chief Complaint   Patient presents with   . Hypertension   . Asthma   . Diabetes   . Cholesterol Problem   . Vitamin D  Deficiency   . Results     Patient states she will have knee surgery with Dr. Coletta in June 2022.

## 2020-07-02 NOTE — Progress Notes (Signed)
SUBJECTIVE  Ff-up    HTN- currently on aldactone 25 mg. Persistent Hypokalemia- endo eval neg for hyperaldo,  hypoK now back to normal, currently on kcl 80 meqs + Mg    S/p gastric bypass 02/2018 with significant improvement of her metabolic syndrome control and taken off multiple medications, including DM/cholesterol.    DM-diet controlled, she does not check glucose at home. She is no longer on metformin.  a1c 5.5  ??  Asthma ?COPD- non smoker, denies cough, sob, wheezing. She says last albuterol use was a year ago. Usually heat triggers respiratory symptoms.    Chronic bilateral knee pain- now following dr. Meyer Cory, considering TKR      ROS:  See HPI, all others negative        Patient Active Problem List   Diagnosis Code   ??? 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 Type 2 diabetes mellitus without complication (HCC) E11.9   ??? Incontinence in female R32   ??? Candidal intertrigo B37.2   ??? 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 Essential hypertension I10   ??? COPD (chronic obstructive pulmonary disease) (HCC) J44.9   ??? Hyperlipidemia E78.5   ??? Vitamin D deficiency E55.9   ??? Internal hemorrhoids K64.8   ??? Colon polyps K63.5   ??? Hypokalemia E87.6   ??? Hypomagnesemia E83.42   ??? Syncope R55   ??? Elevated troponin R77.8   ??? 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 Hypocalcemia E83.51   ??? Mild intermittent asthma J45.20       Current Outpatient Medications   Medication Sig Dispense Refill   ??? nystatin (MYCOSTATIN) topical cream Apply  to affected area two (2) times a day. 15 g 1   ??? potassium chloride (K-DUR, KLOR-CON M20) 20 mEq tablet Take 2 Tablets by mouth daily. 180 Tablet 1   ??? spironolactone (ALDACTONE) 25 mg tablet Take 1 Tablet by mouth daily. 90 Tablet 0   ??? ergocalciferol (ERGOCALCIFEROL) 1,250 mcg (50,000 unit) capsule Take 1 Capsule by mouth every seven (7) days. 12 Capsule 3   ??? diclofenac (VOLTAREN) 1 % gel Apply 4 g to affected area four (4) times daily as needed for Pain. 1 Each 3   ??? magnesium oxide (MAG-OX)  400 mg tablet Take 1 Tablet by mouth daily. 90 Tablet 0   ??? calcium citrate 200 mg (950 mg) tablet Take 1 Tab by mouth daily. Indications: malabsorption 30 Tab 0   ??? multivitamin (ONE A DAY) tablet Take 1 Tab by mouth daily.     ??? thiamine HCL (B-1) 100 mg tablet Take  by mouth daily.     ??? cyanocobalamin 1,000 mcg tablet Take 1,000 mcg by mouth daily.     ??? cholecalciferol, VITAMIN D3, (VITAMIN D3) 5,000 unit tab tablet Take 5,000 Units by mouth daily.     ??? alcohol swabs padm Use daily to test blood sugar  DX E11.9 100 Pad 1   ??? cyclobenzaprine (FLEXERIL) 5 mg tablet Take 1 Tablet by mouth nightly. (Patient not taking: Reported on 06/27/2020) 90 Tablet 0   ??? albuterol (PROVENTIL HFA, VENTOLIN HFA, PROAIR HFA) 90 mcg/actuation inhaler Take 1-2 Puffs by inhalation every four (4) hours as needed for Wheezing. (Patient not taking: Reported on 03/04/2020) 1 Inhaler 0       Allergies   Allergen Reactions   ??? Latex Rash   ??? Penicillins Itching   ??? Acetaminophen-Codeine Nausea Only and Other (comments)     Nausea and pains in  stomach   ??? Butrans [Buprenorphine] Rash   ??? Codeine Other (comments)     Nausea and abd pain   ??? Iodine Rash   ??? Lactose Diarrhea     Lactose intolerance   ??? Other Plant, Animal, Environmental Hives     Conductive electrodes       Past Medical History:   Diagnosis Date   ??? Arthritis     knee, arms,    ??? Asthma    ??? Chronic obstructive pulmonary disease (HCC)    ??? Chronic pain    ??? Diabetes (HCC)    ??? H/O sinusitis    ??? Hypercholesterolemia    ??? Hypertension    ??? Sleep apnea     NOT USING    ??? Stress incontinence    ??? Toe fracture, left August 2015    4th toe       Social History     Socioeconomic History   ??? Marital status: SINGLE     Spouse name: Not on file   ??? Number of children: Not on file   ??? Years of education: Not on file   ??? Highest education level: Not on file   Occupational History   ??? Not on file   Tobacco Use   ??? Smoking status: Never Smoker   ??? Smokeless tobacco: Never Used   Substance  and Sexual Activity   ??? Alcohol use: No     Alcohol/week: 0.0 standard drinks   ??? Drug use: No   ??? Sexual activity: Not Currently   Other Topics Concern   ??? 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 Denies history of smoking      Social Determinants of Psychologist, prison and probation services Strain:    ??? Difficulty of Paying Living Expenses: Not on file   Food Insecurity:    ??? Worried About Programme researcher, broadcasting/film/video in the Last Year: Not on file   ??? Ran Out of Food in the Last Year: Not on file   Transportation Needs:    ??? Lack of Transportation (Medical): Not on file   ??? Lack of Transportation (Non-Medical): Not on file   Physical Activity:    ??? Days of Exercise per Week: Not on file   ??? Minutes of Exercise per Session: Not on file   Stress:    ??? Feeling of Stress : Not on file   Social Connections:    ??? Frequency of Communication with Friends and Family: Not on file   ??? Frequency of Social Gatherings with Friends and Family: Not on file   ??? Attends Religious Services: Not on file   ??? Active Member of Clubs or Organizations: Not on file   ??? Attends Banker Meetings: Not on file   ??? Marital Status: Not on file   Intimate Partner Violence:    ??? Fear of Current or Ex-Partner: Not on file   ??? Emotionally Abused: Not on file   ??? Physically Abused: Not on file   ??? Sexually Abused: Not on file   Housing Stability:    ??? Unable to Pay for Housing in the Last Year: Not on file   ??? Number of Places Lived in the Last Year: Not on file   ??? Unstable Housing in the Last Year: Not on file       Family History   Problem Relation Age of  Onset   ??? Hypertension Mother    ??? Stroke Mother    ??? Diabetes Mother    ??? Thyroid Disease Mother    ??? Arthritis-rheumatoid Mother    ??? Cancer Father         colon polpe   ??? Cancer Maternal Aunt          OBJECTIVE    Physical Exam:     Visit Vitals  BP 124/70 (BP 1 Location: Left upper arm, BP Patient Position: Sitting, BP Cuff Size:  Adult)   Pulse 68   Temp 97.1 ??F (36.2 ??C) (Temporal)   Resp 16   Ht 5\' 3"  (1.6 m)   Wt 184 lb 6.4 oz (83.6 kg)   SpO2 97%   BMI 32.66 kg/m??       General: alert, well-appearing,obese,AA, in no apparent distress or pain  Neck: supple, no adenopathy palpated  CVS: normal rate, regular rhythm, distinct S1 and S2  Lungs:clear to ausculation bilaterally, no crackles, wheezing or rhonchi noted  Abdomen: normoactive bowel sounds, soft, non-tender  Psych:  mood and affect normal  Results for orders placed or performed during the hospital encounter of 06/25/20   METABOLIC PANEL, BASIC   Result Value Ref Range    Sodium 142 136 - 145 mmol/L    Potassium 4.6 3.5 - 5.5 mmol/L    Chloride 109 100 - 111 mmol/L    CO2 28 21 - 32 mmol/L    Anion gap 5 3.0 - 18 mmol/L    Glucose 104 (H) 74 - 99 mg/dL    BUN 15 7.0 - 18 MG/DL    Creatinine 08/23/20 0.6 - 1.3 MG/DL    BUN/Creatinine ratio 22 (H) 12 - 20      GFR est AA >60 >60 ml/min/1.82m2    GFR est non-AA >60 >60 ml/min/1.66m2    Calcium 9.2 8.5 - 10.1 MG/DL   MAGNESIUM   Result Value Ref Range    Magnesium 1.7 1.6 - 2.6 mg/dL           ASSESSMENT/PLAN  Diagnoses and all orders for this visit:     Essential hypertension  controlled  w/ hypoKalemia issues  Cont aldactone 25 mg  Cont Kcl 80 meqs/day  DASH diet, BP Log  BMP/mg in 4 weeks  ??  Diabetes mellitus type 2, diet-controlled (HCC)  Off meds after gastric bypass 10/19  Monitoring   Check a1c/cmp/lipid panel prior to next visit     Mild intermittent asthma, unspecified whether complicated  Controlled  Cont prn albuterol     Pure hypercholesterolemia   off statin after wt loss/gastric bypass 2019  Monitoring, satisfactory lipids 07/2019     Chronic obstructive pulmonary disease, unspecified COPD type (HCC)  Unclear h/o, nonsmoker  pt reports recent pulm clearance prior to gastric bypass ?[facility]  Cont prn albuterol    BMI 32  S/p gastric bypass    Vitamin D deficiency   s/p gastric bypass  Cont 50 k weekly  Monitoring    S/p gastric  bypass    Hypokalemia  Cont mg 400 mg every day/kcl 80 meqs daily, plus aldactone    Candidal intertrigo  Cont prn nystatin    Ff-up in 3 months  Patient understands plan of care. Patient has provided input and agrees with goals.

## 2020-07-29 ENCOUNTER — Inpatient Hospital Stay: Admit: 2020-07-29 | Payer: MEDICARE | Attending: Family Medicine | Primary: Family Medicine

## 2020-07-29 DIAGNOSIS — Z1231 Encounter for screening mammogram for malignant neoplasm of breast: Secondary | ICD-10-CM

## 2020-07-29 MED ORDER — SPIRONOLACTONE 25 MG TAB
25 mg | ORAL_TABLET | ORAL | 0 refills | Status: DC
Start: 2020-07-29 — End: 2020-10-22

## 2020-07-29 NOTE — Progress Notes (Signed)
Progress Notes by Sherlean Foot, LPN at 02/77/41 1300                Author: Sherlean Foot, LPN  Service: --  Author Type: Licensed Nurse       Filed: 08/01/20 1300  Date of Service: 08/01/20 1300  Status: Signed          Editor: Sherlean Foot, LPN (Licensed Nurse)               Patient identified with 2 identifiers (name and DOB).      Normal mammogram, however commend on mild fluid overload.    Advised ff-up in person for evaluation of this soon. Patient has been scheduled for 08/06/2020. Er precautions have been reviewed with patient. SOB chest pain

## 2020-07-29 NOTE — Progress Notes (Signed)
Progress Notes by Mable Fill, MD at 07/29/20 1609                Author: Mable Fill, MD  Service: FAMILY MEDICINE  Author Type: Physician       Filed: 07/29/20 1609  Date of Service: 07/29/20 1609  Status: Signed          Editor: Mable Fill, MD (Physician)               Normal mammogram, however commend on mild fluid overload.    Advised ff-up in person for evaluation of this soon, pls go over alarm symptoms requiring emergent tx (SOB/CP)

## 2020-08-01 ENCOUNTER — Other Ambulatory Visit
Admission: RE | Admit: 2020-08-01 | Discharge: 2020-08-01 | Disposition: A | Discharge status: Home / Self Care | Payer: Medicare HMO | Source: Ambulatory Visit | Attending: Internal Medicine | Admitting: Internal Medicine

## 2020-08-01 ENCOUNTER — Other Ambulatory Visit: Payer: Self-pay

## 2020-08-01 DIAGNOSIS — Z01812 Encounter for preprocedural laboratory examination: Secondary | ICD-10-CM | POA: Diagnosis present

## 2020-08-01 DIAGNOSIS — Z20822 Contact with and (suspected) exposure to covid-19: Secondary | ICD-10-CM | POA: Diagnosis not present

## 2020-08-01 LAB — SARS CORONAVIRUS 2 (TAT 6-24 HRS): SARS Coronavirus 2: NEGATIVE

## 2020-08-02 ENCOUNTER — Encounter: Payer: Self-pay | Admitting: Internal Medicine

## 2020-08-05 ENCOUNTER — Encounter: Payer: Self-pay | Admitting: Internal Medicine

## 2020-08-05 ENCOUNTER — Ambulatory Visit: Admit: 2020-08-05 | Payer: Medicare HMO | Admitting: Internal Medicine

## 2020-08-05 ENCOUNTER — Ambulatory Visit: Payer: Medicare HMO | Admitting: Certified Registered"

## 2020-08-05 ENCOUNTER — Ambulatory Visit
Admission: RE | Admit: 2020-08-05 | Discharge: 2020-08-05 | Disposition: A | Discharge status: Home / Self Care | Payer: Medicare HMO | Source: Ambulatory Visit | Attending: Internal Medicine | Admitting: Internal Medicine

## 2020-08-05 ENCOUNTER — Encounter
Admission: RE | Disposition: A | Discharge status: Home / Self Care | Payer: Self-pay | Source: Ambulatory Visit | Attending: Internal Medicine

## 2020-08-05 DIAGNOSIS — K31A11 Gastric intestinal metaplasia without dysplasia, involving the antrum: Secondary | ICD-10-CM | POA: Insufficient documentation

## 2020-08-05 DIAGNOSIS — K2100 Gastro-esophageal reflux disease with esophagitis, without bleeding: Secondary | ICD-10-CM | POA: Diagnosis not present

## 2020-08-05 DIAGNOSIS — R1013 Epigastric pain: Secondary | ICD-10-CM | POA: Diagnosis present

## 2020-08-05 DIAGNOSIS — E785 Hyperlipidemia, unspecified: Secondary | ICD-10-CM | POA: Diagnosis not present

## 2020-08-05 DIAGNOSIS — K2950 Unspecified chronic gastritis without bleeding: Secondary | ICD-10-CM | POA: Insufficient documentation

## 2020-08-05 DIAGNOSIS — Z79899 Other long term (current) drug therapy: Secondary | ICD-10-CM | POA: Diagnosis not present

## 2020-08-05 DIAGNOSIS — Z8619 Personal history of other infectious and parasitic diseases: Secondary | ICD-10-CM | POA: Diagnosis not present

## 2020-08-05 DIAGNOSIS — Z9889 Other specified postprocedural states: Secondary | ICD-10-CM | POA: Insufficient documentation

## 2020-08-05 DIAGNOSIS — Z885 Allergy status to narcotic agent status: Secondary | ICD-10-CM | POA: Insufficient documentation

## 2020-08-05 DIAGNOSIS — Z7982 Long term (current) use of aspirin: Secondary | ICD-10-CM | POA: Insufficient documentation

## 2020-08-05 DIAGNOSIS — Z888 Allergy status to other drugs, medicaments and biological substances status: Secondary | ICD-10-CM | POA: Diagnosis not present

## 2020-08-05 DIAGNOSIS — E7800 Pure hypercholesterolemia, unspecified: Secondary | ICD-10-CM | POA: Insufficient documentation

## 2020-08-05 DIAGNOSIS — Z79890 Hormone replacement therapy (postmenopausal): Secondary | ICD-10-CM | POA: Insufficient documentation

## 2020-08-05 HISTORY — PX: ESOPHAGOGASTRODUODENOSCOPY (EGD) WITH PROPOFOL: SHX5813

## 2020-08-05 SURGERY — ESOPHAGOGASTRODUODENOSCOPY (EGD) WITH PROPOFOL
Anesthesia: General

## 2020-08-05 MED ORDER — PROPOFOL 10 MG/ML IV BOLUS
INTRAVENOUS | Status: DC | PRN
  Administered 2020-08-05: 60 mg via INTRAVENOUS

## 2020-08-05 MED ORDER — PROPOFOL 10 MG/ML IV BOLUS
INTRAVENOUS | Status: AC
  Filled 2020-08-05: qty 20

## 2020-08-05 MED ORDER — LIDOCAINE HCL (CARDIAC) PF 100 MG/5ML IV SOSY
PREFILLED_SYRINGE | INTRAVENOUS | Status: DC | PRN
  Administered 2020-08-05: 50 mg via INTRAVENOUS

## 2020-08-05 MED ORDER — GLYCOPYRROLATE 0.2 MG/ML IJ SOLN
INTRAMUSCULAR | Status: DC | PRN
  Administered 2020-08-05: .2 mg via INTRAVENOUS

## 2020-08-05 MED ORDER — PROPOFOL 500 MG/50ML IV EMUL
INTRAVENOUS | Status: AC
  Filled 2020-08-05: qty 50

## 2020-08-05 MED ORDER — SODIUM CHLORIDE 0.9 % IV SOLN: INTRAVENOUS | Status: DC

## 2020-08-05 MED ORDER — PROPOFOL 500 MG/50ML IV EMUL
INTRAVENOUS | Status: DC | PRN
  Administered 2020-08-05: 130 ug/kg/min via INTRAVENOUS

## 2020-08-05 NOTE — Interval H&P Note (Signed)
History and Physical Interval Note:  08/05/2020 1:49 PM  Brittany [name]  has presented today for surgery, with the diagnosis of GERD EPIG PAIN.  The various methods of treatment have been discussed with the patient and family. After consideration of risks, benefits and other options for treatment, the patient has consented to  Procedure(s): ESOPHAGOGASTRODUODENOSCOPY (EGD) WITH PROPOFOL (N/A) as a surgical intervention.  [name] history has been reviewed, patient examined, no change in status, stable for surgery.  I have reviewed the patient's chart and labs.  Questions were answered to the patient's satisfaction.     [place], [place]

## 2020-08-05 NOTE — Op Note (Signed)
[facility] Patient Name: Brittany [name] Procedure Date: 08/05/2020 2:31 PM MRN: [id number] Account #: [id number] Date of Birth: 1951-03-02 Admit Type: Outpatient Age: 70 Room: [facility] ENDO ROOM 2 Gender: Female Note Status: Finalized Procedure:             Upper GI endoscopy Indications:           Epigastric abdominal pain, Gastro-esophageal reflux                         disease, Failure to respond to medical treatment,                         Assessment following Nissen fundoplication Providers:             Benay Pike. Alice Reichert MD, MD Referring MD:          Caprice Renshaw MD (Referring MD) Medicines:             Propofol per Anesthesia Complications:         No immediate complications. Procedure:             Pre-Anesthesia Assessment:                        - The risks and benefits of the procedure and the                         sedation options and risks were discussed with the                         patient. All questions were answered and informed                         consent was obtained.                        - Patient identification and proposed procedure were                         verified prior to the procedure by the nurse. The                         procedure was verified in the procedure room.                        - ASA Grade Assessment: III - A patient with severe                         systemic disease.                        - After reviewing the risks and benefits, the patient                         was deemed in satisfactory condition to undergo the                         procedure.                        After obtaining informed consent, the  endoscope was                         passed under direct vision. Throughout the procedure,                         the patient's blood pressure, pulse, and oxygen                         saturations were monitored continuously. The Endoscope                         was introduced  through the mouth, and advanced to the                         third part of duodenum. The upper GI endoscopy was                         accomplished without difficulty. The patient tolerated                         the procedure well. Findings:      Moderate tortuosity of the mid to distal esophagus was noted compatible       with a diagnosis of Presbyesophagus.      Normal mucosa was found in the entire esophagus. Biopsies were obtained       from the proximal and distal esophagus with cold forceps for histology       of suspected eosinophilic esophagitis.      There is no endoscopic evidence of stenosis or stricture in the lower       third of the esophagus.      Evidence of a partial fundoplication was found in the cardia. The wrap       appeared loose. This was traversed.      Diffuse moderate inflammation characterized by congestion (edema),       erosions and erythema was found in the gastric body. Biopsies were taken       with a cold forceps for histology.      The examined duodenum was normal.      A deformity was found at the pylorus. Biopsies were taken with a cold       forceps for histology.      The exam was otherwise without abnormality. Impression:            - Normal mucosa was found in the entire esophagus.                         Biopsied.                        - A partial fundoplication was found. The wrap appears                         loose.                        - Gastritis. Biopsied.                        - Normal examined duodenum. Recommendation:        - Patient  has a contact number available for                         emergencies. The signs and symptoms of potential                         delayed complications were discussed with the patient.                         Return to normal activities tomorrow. Written                         discharge instructions were provided to the patient.                        - Resume previous diet.                         - Continue present medications.                        - Await pathology results.                        - Return to physician assistant in 3 months.                        - Follow up with Octavia Bruckner, PA-C in [ ]  months.                        - The findings and recommendations were discussed with                         the patient. Procedure Code(s):     --- Professional ---                        [phone], Esophagogastroduodenoscopy, flexible,                         transoral; with biopsy, single or multiple Diagnosis Code(s):     --- Professional ---                        [phone], Other specified postprocedural states                        Z09, Encounter for follow-up examination after                         completed treatment for conditions other than                         malignant neoplasm                        R10.13, Epigastric pain                        K21.9, Gastro-esophageal reflux disease without                         esophagitis  K29.70, Gastritis, unspecified, without bleeding CPT copyright 2019 American Medical Association. All rights reserved. The codes documented in this report are preliminary and upon coder review may  be revised to meet current compliance requirements. Efrain Sella MD, MD 08/05/2020 2:52:46 PM This report has been signed electronically. Number of Addenda: 0 Note Initiated On: 08/05/2020 2:31 PM Estimated Blood Loss:  Estimated blood loss: none. Estimated blood loss: none.      [facility]

## 2020-08-05 NOTE — Anesthesia Preprocedure Evaluation (Signed)
Anesthesia Evaluation  Patient identified by MRN, date of birth, ID band Patient awake    Reviewed: Allergy & Precautions, H&P , NPO status , Patient's Chart, lab work & pertinent test results  History of Anesthesia Complications (+) PONV and history of anesthetic complications  Airway Mallampati: I  TM Distance: >3 FB     Dental  (+) Missing, Chipped, Poor Dentition, Dental Advidsory Given   Pulmonary neg shortness of breath, COPD, neg recent URI, former smoker,           Cardiovascular Exercise Tolerance: Good negative cardio ROS       Neuro/Psych neg Seizures PSYCHIATRIC DISORDERS Anxiety Depression CVA, No Residual Symptoms    GI/Hepatic Neg liver ROS, hiatal hernia, GERD  Poorly Controlled and Medicated,  Endo/Other  neg diabetesHypothyroidism   Renal/GU negative Renal ROS  negative genitourinary   Musculoskeletal   Abdominal   Peds  Hematology  (+) Blood dyscrasia, anemia ,   Anesthesia Other Findings Past Medical History: No date: Allergic genetic state No date: Anemia No date: Anxiety No date: Arthritis No date: Cataract No date: Chicken pox No date: Complication of anesthesia No date: Depression No date: GERD (gastroesophageal reflux disease) No date: H/O hiatal hernia No date: Hypercholesteremia No date: Hyperlipidemia No date: Hypothyroidism No date: Osteoporosis No date: PONV (postoperative nausea and vomiting) No date: Thyroid disease  Past Surgical History: 2002: ABDOMINAL HYSTERECTOMY     Comment:  total 05/26/2012: BRAVO PH STUDY     Comment:  Procedure: BRAVO [place] STUDY;  Surgeon: Lear Ng, MD;  Location: [facility] ENDOSCOPY;  Service:               Endoscopy;  Laterality: N/A; 2006: CHOLECYSTECTOMY 05/26/2012: ESOPHAGOGASTRODUODENOSCOPY     Comment:  Procedure: ESOPHAGOGASTRODUODENOSCOPY (EGD);  Surgeon:               Lear Ng, MD;  Location: [facility];                 Service: Endoscopy;  Laterality: N/A; 05/30/2012: ESOPHAGOGASTRODUODENOSCOPY     Comment:  Procedure: ESOPHAGOGASTRODUODENOSCOPY (EGD);  Surgeon:               Cleotis Nipper, MD;  Location: [facility] ENDOS[facility];  Service:               Endoscopy;  Laterality: N/A; 08/31/2012: ESOPHAGOGASTRODUODENOSCOPY; N/A     Comment:  Procedure: ESOPHAGOGASTRODUODENOSCOPY (EGD);  Surgeon:               Wonda Horner, MD;  Location: [facility] ENDOSCOPY;  Service:               Endoscopy;  Laterality: N/A; No date: EYE SURGERY 1970s: HIP SURGERY     Comment:  "hip sunk in" 2012: NECK SURGERY 2013 november: ROTATOR CUFF REPAIR     Comment:  right side No date: THYROID SURGERY     Comment:  nodule removed, right side 1992: TMJ ARTHROPLASTY 1981: TUBAL LIGATION     Reproductive/Obstetrics negative OB ROS                             Anesthesia Physical  Anesthesia Plan  ASA: III  Anesthesia Plan: General   Post-op Pain Management:    Induction: Intravenous  PONV Risk Score and Plan: Propofol infusion and TIVA  Airway Management Planned: Natural Airway  and Nasal Cannula  Additional Equipment:   Intra-op Plan:   Post-operative Plan:   Informed Consent: I have reviewed the patients History and Physical, chart, labs and discussed the procedure including the risks, benefits and alternatives for the proposed anesthesia with the patient or authorized representative who has indicated his/her understanding and acceptance.     Dental Advisory Given  Plan Discussed with: Anesthesiologist and CRNA  Anesthesia Plan Comments:         Anesthesia Quick Evaluation

## 2020-08-05 NOTE — H&P (Signed)
Outpatient short stay form Pre-procedure 08/05/2020 1:47 PM Lynett Brasil K. Alice Reichert, M.D.  Primary Physician: Derinda Late, M.D.  Reason for visit:  GERD, epigastric pain,   History of present illness: AS above. Denies dysphagia, hemetemesis, anemia, anorexia, weight loss.   No current facility-administered medications for this encounter.  Medications Prior to Admission  Medication Sig Dispense Refill Last Dose  . atorvastatin (LIPITOR) 40 MG tablet Take 1 tablet (40 mg total) by mouth daily. 30 tablet 0 08/05/2020 at 0820  . calcium carbonate (OS-CAL) 600 MG TABS Take 600 mg by mouth 2 (two) times daily with a meal.   Past Week at Unknown time  . CHOLECALCIFEROL PO Take 2,000 Units by mouth daily.   Past Week at Unknown time  . clorazepate (TRANXENE) 7.5 MG tablet Take 7.5 mg by mouth See admin instructions. Take 7.5 mg by mouth in the morning and at bedtime. May take an additional during the day if needed.   08/05/2020 at [place]  . desipramine (NORPRAMIN) 50 MG tablet Take 50 mg by mouth daily.   08/05/2020 at [place]  . levothyroxine (SYNTHROID, LEVOTHROID) 25 MCG tablet Take 25 mcg by mouth every morning.    08/05/2020 at 0820  . montelukast (SINGULAIR) 10 MG tablet Take 10 mg by mouth at bedtime.   08/04/2020 at 2100  . Multiple Vitamins-Minerals (WOMENS 50+ MULTI VITAMIN/MIN PO) Take 1 tablet by mouth daily.   Past Week at Unknown time  . pantoprazole (PROTONIX) 40 MG tablet Take 40 mg by mouth 2 (two) times daily before a meal.     . traZODone (DESYREL) 50 MG tablet Take 50 mg by mouth at bedtime.   08/04/2020 at 2130  . aluminum hydroxide-magnesium carbonate (GAVISCON) 95-358 MG/15ML SUSP Take 15 mLs by mouth every 6 (six) hours as needed (for gas). (Patient not taking: Reported on 08/05/2020)   Not Taking at Unknown time  . aspirin EC 81 MG tablet Take 81 mg by mouth daily. (Patient not taking: Reported on 08/05/2020)   Not Taking at Unknown time  . citalopram (CELEXA) 40 MG tablet Take 40 mg by  mouth every morning.  (Patient not taking: Reported on 08/05/2020)   Not Taking at Unknown time  . estradiol (ESTRACE) 0.1 MG/GM vaginal cream Place 1 Applicatorful vaginally daily. (Patient not taking: Reported on 08/05/2020)   Not Taking at Unknown time  . famotidine (PEPCID) 20 MG tablet Take 20 mg by mouth daily. (Patient not taking: Reported on 08/05/2020)   Not Taking at Unknown time  . fluticasone (FLONASE) 50 MCG/ACT nasal spray Place into both nostrils daily. (Patient not taking: Reported on 08/05/2020)   Not Taking at Unknown time  . loratadine (CLARITIN) 10 MG tablet Take 10 mg by mouth at bedtime.     . prednisoLONE acetate (PRED FORTE) 1 % ophthalmic suspension Place 1 drop into the left eye 4 (four) times daily. (Patient not taking: Reported on 08/05/2020)   Not Taking at Unknown time  . topiramate (TOPAMAX) 25 MG tablet Take 25 mg by mouth daily.        Allergies  Allergen Reactions  . Lithium Anaphylaxis  . Fentanyl Other (See Comments)    Extreme Sedation Other reaction(s): Lethargy (intolerance) Extreme sedation   . Morphine Nausea And Vomiting  . Pseudoephedrine Hypertension     Past Medical History:  Diagnosis Date  . Allergic genetic state   . Anemia   . Anxiety   . Arthritis   . Cataract   . Chicken pox   .  Complication of anesthesia   . Depression   . GERD (gastroesophageal reflux disease)   . H/O hiatal hernia   . Hypercholesteremia   . Hyperlipidemia   . Hypothyroidism   . Osteoporosis   . PONV (postoperative nausea and vomiting)   . Thyroid disease     Review of systems:  Otherwise negative.    Physical Exam  Gen: Alert, oriented. Appears stated age.  HEENT: [place]/AT. PERRLA. Lungs: CTA, no wheezes. CV: RR nl S1, S2. Abd: soft, benign, no masses. BS+ Ext: No edema. Pulses 2+    Planned procedures: Proceed with EGD.. The patient understands the nature of the planned procedure, indications, risks, alternatives and potential complications  including but not limited to bleeding, infection, perforation, damage to internal organs and possible oversedation/side effects from anesthesia. The patient agrees and gives consent to proceed.  Please refer to procedure notes for findings, recommendations and patient disposition/instructions.     Akilah Cureton K. Alice Reichert, M.D. Gastroenterology 08/05/2020  1:47 PM

## 2020-08-05 NOTE — Transfer of Care (Signed)
Immediate Anesthesia Transfer of Care Note  Patient: Brittany [name]  Procedure(s) Performed: ESOPHAGOGASTRODUODENOSCOPY (EGD) WITH PROPOFOL (N/A )  Patient Location: PACU and Endoscopy Unit  Anesthesia Type:General  Level of Consciousness: drowsy  Airway & Oxygen Therapy: Patient Spontanous Breathing  Post-op Assessment: Report given to RN  Post vital signs: stable  Last Vitals:  Vitals Value Taken Time  BP    Temp    Pulse    Resp    SpO2      Last Pain:  Vitals:   08/05/20 1336  TempSrc: Temporal  PainSc: 0-No pain         Complications: No complications documented.

## 2020-08-06 ENCOUNTER — Ambulatory Visit: Attending: Family Medicine | Primary: Family Medicine

## 2020-08-06 ENCOUNTER — Ambulatory Visit: Admit: 2020-08-06 | Discharge: 2020-08-06 | Payer: MEDICARE | Attending: Family Medicine | Primary: Family Medicine

## 2020-08-06 ENCOUNTER — Encounter: Payer: Self-pay | Admitting: Internal Medicine

## 2020-08-06 DIAGNOSIS — R635 Abnormal weight gain: Secondary | ICD-10-CM

## 2020-08-06 NOTE — Anesthesia Postprocedure Evaluation (Signed)
Anesthesia Post Note  Patient: Brittany [name]  Procedure(s) Performed: ESOPHAGOGASTRODUODENOSCOPY (EGD) WITH PROPOFOL (N/A )  Patient location during evaluation: Endoscopy Anesthesia Type: General Level of consciousness: awake and alert Pain management: pain level controlled Vital Signs Assessment: post-procedure vital signs reviewed and stable Respiratory status: spontaneous breathing, nonlabored ventilation, respiratory function stable and patient connected to nasal cannula oxygen Cardiovascular status: blood pressure returned to baseline and stable Postop Assessment: no apparent nausea or vomiting Anesthetic complications: no   No complications documented.   Last Vitals:  Vitals:   08/05/20 1449 08/05/20 1459  BP:    Pulse:    Resp:  20  Temp: 36.8 C   SpO2:      Last Pain:  Vitals:   08/05/20 1500  TempSrc:   PainSc: 0-No pain                 Martha Clan

## 2020-08-06 NOTE — Progress Notes (Signed)
 1. Have you been to the ER, urgent care clinic since your last visit?  Hospitalized since your last visit? No    2. Have you seen or consulted any other health care providers outside of the [facility] since your last visit?  Include any pap smears or colon screening. No    Chief Complaint   Patient presents with   . Results     discuss mammogram results

## 2020-08-06 NOTE — Progress Notes (Signed)
Cheryl Paul, 70 y.o.,  female    SUBJECTIVE  Ff-up mammogram      On routine screening, noted fluid overload suggestive of CHF.  She denies previous hx/o.  She denies any sob, chest pain, leg edema, pillow orthopnea. She has known DM/HTN/COPD/asthma.  She has gained about 6 lbs since last visit a month ago  Reviewed echo 2019 left atrial enlargement/EF 60%    ROS:  See HPI, all others negative        Patient Active Problem List   Diagnosis Code   ??? 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 Type 2 diabetes mellitus without complication (HCC) E11.9   ??? Incontinence in female R32   ??? Candidal intertrigo B37.2   ??? 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 Essential hypertension I10   ??? COPD (chronic obstructive pulmonary disease) (HCC) J44.9   ??? Hyperlipidemia E78.5   ??? Vitamin D deficiency E55.9   ??? Internal hemorrhoids K64.8   ??? Colon polyps K63.5   ??? Hypokalemia E87.6   ??? Hypomagnesemia E83.42   ??? Syncope R55   ??? Elevated troponin R77.8   ??? 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 Hypocalcemia E83.51   ??? Mild intermittent asthma J45.20       Current Outpatient Medications   Medication Sig Dispense Refill   ??? spironolactone (ALDACTONE) 25 mg tablet TAKE 1 TABLET EVERY DAY 90 Tablet 0   ??? nystatin (MYCOSTATIN) topical cream Apply  to affected area two (2) times a day. 15 g 1   ??? potassium chloride (K-DUR, KLOR-CON M20) 20 mEq tablet Take 2 Tablets by mouth daily. 180 Tablet 1   ??? ergocalciferol (ERGOCALCIFEROL) 1,250 mcg (50,000 unit) capsule Take 1 Capsule by mouth every seven (7) days. 12 Capsule 3   ??? diclofenac (VOLTAREN) 1 % gel Apply 4 g to affected area four (4) times daily as needed for Pain. 1 Each 3   ??? magnesium oxide (MAG-OX) 400 mg tablet Take 1 Tablet by mouth daily. 90 Tablet 0   ??? calcium citrate 200 mg (950 mg) tablet Take 1 Tab by mouth daily. Indications: malabsorption 30 Tab 0   ??? multivitamin (ONE A DAY) tablet Take 1 Tab by mouth daily.     ??? thiamine HCL (B-1) 100 mg tablet Take  by mouth daily.     ???  cyanocobalamin 1,000 mcg tablet Take 1,000 mcg by mouth daily.     ??? cholecalciferol, VITAMIN D3, (VITAMIN D3) 5,000 unit tab tablet Take 5,000 Units by mouth daily. (Patient not taking: Reported on 08/06/2020)     ??? albuterol (PROVENTIL HFA, VENTOLIN HFA, PROAIR HFA) 90 mcg/actuation inhaler Take 1-2 Puffs by inhalation every four (4) hours as needed for Wheezing. (Patient not taking: Reported on 03/04/2020) 1 Inhaler 0   ??? alcohol swabs padm Use daily to test blood sugar  DX E11.9 (Patient not taking: Reported on 08/06/2020) 100 Pad 1       Allergies   Allergen Reactions   ??? Latex Rash   ??? Penicillins Itching   ??? Acetaminophen-Codeine Nausea Only and Other (comments)     Nausea and pains in stomach   ??? Butrans [Buprenorphine] Rash   ??? Codeine Other (comments)     Nausea and abd pain   ??? Iodine Rash   ??? Lactose Diarrhea     Lactose intolerance   ??? Other Plant, Animal, Environmental Hives     Conductive electrodes       Past  Medical History:   Diagnosis Date   ??? Arthritis     knee, arms,    ??? Asthma    ??? Chronic obstructive pulmonary disease (HCC)    ??? Chronic pain    ??? Diabetes (HCC)    ??? H/O sinusitis    ??? Hypercholesterolemia    ??? Hypertension    ??? Sleep apnea     NOT USING    ??? Stress incontinence    ??? Toe fracture, left August 2015    4th toe       Social History     Socioeconomic History   ??? Marital status: SINGLE     Spouse name: Not on file   ??? Number of children: Not on file   ??? Years of education: Not on file   ??? Highest education level: Not on file   Occupational History   ??? Not on file   Tobacco Use   ??? Smoking status: Never Smoker   ??? Smokeless tobacco: Never Used   Substance and Sexual Activity   ??? Alcohol use: No     Alcohol/week: 0.0 standard drinks   ??? Drug use: No   ??? Sexual activity: Not Currently   Other Topics Concern   ??? 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 Denies history of smoking      Social Determinants of Product/process development scientist Strain:    ??? Difficulty of Paying Living Expenses: Not on file   Food Insecurity:    ??? Worried About Programme researcher, broadcasting/film/video in the Last Year: Not on file   ??? Ran Out of Food in the Last Year: Not on file   Transportation Needs:    ??? Lack of Transportation (Medical): Not on file   ??? Lack of Transportation (Non-Medical): Not on file   Physical Activity:    ??? Days of Exercise per Week: Not on file   ??? Minutes of Exercise per Session: Not on file   Stress:    ??? Feeling of Stress : Not on file   Social Connections:    ??? Frequency of Communication with Friends and Family: Not on file   ??? Frequency of Social Gatherings with Friends and Family: Not on file   ??? Attends Religious Services: Not on file   ??? Active Member of Clubs or Organizations: Not on file   ??? Attends Banker Meetings: Not on file   ??? Marital Status: Not on file   Intimate Partner Violence:    ??? Fear of Current or Ex-Partner: Not on file   ??? Emotionally Abused: Not on file   ??? Physically Abused: Not on file   ??? Sexually Abused: Not on file   Housing Stability:    ??? Unable to Pay for Housing in the Last Year: Not on file   ??? Number of Places Lived in the Last Year: Not on file   ??? Unstable Housing in the Last Year: Not on file       Family History   Problem Relation Age of Onset   ??? Hypertension Mother    ??? Stroke Mother    ??? Diabetes Mother    ??? Thyroid Disease Mother    ??? Arthritis-rheumatoid Mother    ??? Cancer Father         colon polpe   ??? Cancer Maternal Aunt  OBJECTIVE    Physical Exam:     Visit Vitals  BP 130/80   Pulse 68   Temp 97.4 ??F (36.3 ??C) (Temporal)   Resp 16   Ht 5\' 3"  (1.6 m)   Wt 190 lb 12.8 oz (86.5 kg)   SpO2 99%   BMI 33.80 kg/m??       General: alert, well-appearing,  in no apparent distress or pain  Head: atraumatic. Non-tender maxillary and frontal sinuses  Neck: supple, no adenopathy palpated  CVS: normal rate, regular rhythm, distinct S1 and S2  Lungs:clear to ausculation bilaterally, no  crackles, wheezing or rhonchi noted  Breasts: breasts appear normal, no suspicious masses, no skin or nipple changes or axillary nodes.  Abdomen: normoactive bowel sounds, soft, non-tender  Extremities: no edema, no cyanosis, MSK grossly normal  Skin: warm, no lesions, rashes noted  Psych:  mood and affect normal        ASSESSMENT/PLAN  Diagnoses and all orders for this visit:    1. Weight gain  Clinically no symptoms of CHF, aside from weight gain  Has hx left atrial enlargement on echo 2019  Will further eval  Check:  -     XR CHEST PA LAT; Future  -     METABOLIC PANEL, BASIC; Future  -     CBC WITH AUTOMATED DIFF; Future  -     NT-PRO BNP; Future  -     ECHO ADULT COMPLETE; Future    2. Essential hypertension  -     METABOLIC PANEL, BASIC; Future  -     CBC WITH AUTOMATED DIFF; Future  -     NT-PRO BNP; Future  -     ECHO ADULT COMPLETE; Future    3. Fluid retention  -     NT-PRO BNP; Future  -     ECHO ADULT COMPLETE; Future    4. Left atrial enlargement  -     NT-PRO BNP; Future  -     ECHO ADULT COMPLETE; Future    5. Generalized edema   -     NT-PRO BNP; Future  -     ECHO ADULT COMPLETE; Future        Follow-up and Dispositions    ?? Return in about 4 weeks (around 09/03/2020), or if symptoms worsen or fail to improve, for labs soon, routine chronic illness care.           Patient understands plan of care. Patient has provided input and agrees with goals.

## 2020-08-07 LAB — SURGICAL PATHOLOGY

## 2020-08-14 ENCOUNTER — Inpatient Hospital Stay: Admit: 2020-08-14 | Payer: MEDICARE | Primary: Family Medicine

## 2020-08-14 DIAGNOSIS — R635 Abnormal weight gain: Secondary | ICD-10-CM

## 2020-08-14 LAB — BASIC METABOLIC PANEL
Anion Gap: 3 mmol/L (ref 3.0–18)
BUN: 12 MG/DL (ref 7.0–18)
Bun/Cre Ratio: 22 — ABNORMAL HIGH (ref 12–20)
CO2: 28 mmol/L (ref 21–32)
Calcium: 8.8 MG/DL (ref 8.5–10.1)
Chloride: 111 mmol/L (ref 100–111)
Creatinine: 0.54 MG/DL — ABNORMAL LOW (ref 0.6–1.3)
EGFR IF NonAfrican American: >60 mL/min/{1.73_m2} (ref >60)
GFR African American: >60 mL/min/{1.73_m2} (ref >60)
Glucose: 94 mg/dL (ref 74–99)
Potassium: 3.2 mmol/L — ABNORMAL LOW (ref 3.5–5.5)
Sodium: 142 mmol/L (ref 136–145)

## 2020-08-14 LAB — CBC WITH AUTO DIFFERENTIAL
Basophils %: 1 % (ref 0–2)
Basophils Absolute: 0.1 10*3/uL (ref 0.0–0.1)
Eosinophils %: 2 % (ref 0–5)
Eosinophils Absolute: 0.1 10*3/uL (ref 0.0–0.4)
Granulocyte Absolute Count: 0 10*3/uL (ref 0.00–0.04)
Hematocrit: 42.4 % (ref 35.0–45.0)
Hemoglobin: 13.4 g/dL (ref 12.0–16.0)
Immature Granulocytes: 0 % (ref 0.0–0.5)
Lymphocytes %: 32 % (ref 21–52)
Lymphocytes Absolute: 2.5 10*3/uL (ref 0.9–3.6)
MCH: 28.2 PG (ref 24.0–34.0)
MCHC: 31.6 g/dL (ref 31.0–37.0)
MCV: 89.1 FL (ref 78.0–100.0)
MPV: 13 FL — ABNORMAL HIGH (ref 9.2–11.8)
Monocytes %: 7 % (ref 3–10)
Monocytes Absolute: 0.6 10*3/uL (ref 0.05–1.2)
NRBC Absolute: 0 10*3/uL (ref 0.00–0.01)
Neutrophils %: 59 % (ref 40–73)
Neutrophils Absolute: 4.5 10*3/uL (ref 1.8–8.0)
Nucleated RBCs: 0 PER 100 WBC
Platelets: 137 10*3/uL (ref 135–420)
RBC: 4.76 M/uL (ref 4.20–5.30)
RDW: 14.4 % (ref 11.6–14.5)
WBC: 7.8 10*3/uL (ref 4.6–13.2)

## 2020-08-14 LAB — PROBNP, N-TERMINAL: BNP: 170 PG/ML (ref 0–900)

## 2020-08-14 LAB — CBC WITH AUTOMATED DIFF
ABS. BASOPHILS: 0.1 10*3/uL (ref 0.0–0.1)
ABS. EOSINOPHILS: 0.1 10*3/uL (ref 0.0–0.4)
ABS. IMM. GRANS.: 0 10*3/uL (ref 0.00–0.04)
ABS. LYMPHOCYTES: 2.5 10*3/uL (ref 0.9–3.6)
ABS. MONOCYTES: 0.6 10*3/uL (ref 0.05–1.2)
ABS. NEUTROPHILS: 4.5 10*3/uL (ref 1.8–8.0)
ABSOLUTE NRBC: 0 10*3/uL (ref 0.00–0.01)
BASOPHILS: 1 % (ref 0–2)
EOSINOPHILS: 2 % (ref 0–5)
HCT: 42.4 % (ref 35.0–45.0)
HGB: 13.4 g/dL (ref 12.0–16.0)
IMMATURE GRANULOCYTES: 0 % (ref 0.0–0.5)
LYMPHOCYTES: 32 % (ref 21–52)
MCH: 28.2 PG (ref 24.0–34.0)
MCHC: 31.6 g/dL (ref 31.0–37.0)
MCV: 89.1 FL (ref 78.0–100.0)
MONOCYTES: 7 % (ref 3–10)
MPV: 13 FL — ABNORMAL HIGH (ref 9.2–11.8)
NEUTROPHILS: 59 % (ref 40–73)
NRBC: 0 PER 100 WBC
PLATELET: 137 10*3/uL (ref 135–420)
RBC: 4.76 M/uL (ref 4.20–5.30)
RDW: 14.4 % (ref 11.6–14.5)
WBC: 7.8 10*3/uL (ref 4.6–13.2)

## 2020-08-14 LAB — METABOLIC PANEL, BASIC
Anion gap: 3 mmol/L (ref 3.0–18)
BUN/Creatinine ratio: 22 — ABNORMAL HIGH (ref 12–20)
BUN: 12 MG/DL (ref 7.0–18)
CO2: 28 mmol/L (ref 21–32)
Calcium: 8.8 MG/DL (ref 8.5–10.1)
Chloride: 111 mmol/L (ref 100–111)
Creatinine: 0.54 MG/DL — ABNORMAL LOW (ref 0.6–1.3)
GFR est AA: >60 mL/min/{1.73_m2} (ref >60)
GFR est non-AA: >60 mL/min/{1.73_m2} (ref >60)
Glucose: 94 mg/dL (ref 74–99)
Potassium: 3.2 mmol/L — ABNORMAL LOW (ref 3.5–5.5)
Sodium: 142 mmol/L (ref 136–145)

## 2020-08-14 LAB — NT-PRO BNP: NT pro-BNP: 170 PG/ML (ref 0–900)

## 2020-08-14 NOTE — Progress Notes (Signed)
Patient identified with 2 identifiers (name and DOB).  Patient aware of normal chest xray and labs showing no CHF  Patient aware of mildly low potassium. Patient states she has been taking her potassium 40 meqs (2 tabs of 20) daily. Patient has been advise that Dr. Sharmon Revere will  increase potassium to 60 meqs. Patient states she will take 3 of the 20 meqs tablets that she has at home. Marland Kitchen Requesting that you send new prescription to [facility] pharmacy.

## 2020-08-14 NOTE — Progress Notes (Signed)
Cxr is normal and lab test does not show CHF K is mildly low, has she been taking KCL 40 meqs (2 tabs of 20) daily?  If yes then will need to increase dose to 60 meqs  If not then will need to remind compliance and importance to electrolyte balance  Pls notify pt.

## 2020-08-14 NOTE — Progress Notes (Signed)
Erx 20 meqs TID then (60 meqs/day)  Repeat BMP next week.

## 2020-08-14 NOTE — Progress Notes (Signed)
Patient identified with 2 identifiers (name and DOB).  Patient aware of Erx 20 meqs TID then (60 meqs/day)  Repeat BMP next week.

## 2020-08-20 ENCOUNTER — Encounter

## 2020-08-20 MED ORDER — POTASSIUM CHLORIDE SR 20 MEQ TAB, PARTICLES/CRYSTALS
20 mEq | ORAL_TABLET | Freq: Three times a day (TID) | ORAL | 0 refills | Status: AC
Start: 2020-08-20 — End: ?

## 2020-09-03 ENCOUNTER — Inpatient Hospital Stay: Admit: 2020-09-03 | Payer: MEDICARE | Primary: Family Medicine

## 2020-09-03 DIAGNOSIS — E119 Type 2 diabetes mellitus without complications: Secondary | ICD-10-CM

## 2020-09-03 LAB — LIPID PANEL
CHOL/HDL Ratio: 2.3 (ref 0–5.0)
Chol/HDL Ratio: 2.3 (ref 0–5.0)
Cholesterol, Total: 160 MG/DL (ref <200)
Cholesterol, total: 160 MG/DL (ref <200)
HDL Cholesterol: 71 MG/DL — ABNORMAL HIGH (ref 40–60)
HDL: 71 MG/DL — ABNORMAL HIGH (ref 40–60)
LDL Calculated: 68.2 MG/DL (ref 0–100)
LDL, calculated: 68.2 MG/DL (ref 0–100)
Triglyceride: 104 MG/DL (ref <150)
Triglycerides: 104 MG/DL (ref <150)
VLDL Cholesterol Calculated: 20.8 MG/DL
VLDL, calculated: 20.8 MG/DL

## 2020-09-03 LAB — COMPREHENSIVE METABOLIC PANEL
ALT: 20 U/L (ref 13–56)
AST: 14 U/L (ref 10–38)
Albumin/Globulin Ratio: 1.4 (ref 0.8–1.7)
Albumin: 3.8 g/dL (ref 3.4–5.0)
Alkaline Phosphatase: 92 U/L (ref 45–117)
Anion Gap: 3 mmol/L (ref 3.0–18)
BUN: 14 MG/DL (ref 7.0–18)
Bun/Cre Ratio: 21 — ABNORMAL HIGH (ref 12–20)
CO2: 29 mmol/L (ref 21–32)
Calcium: 9.1 MG/DL (ref 8.5–10.1)
Chloride: 110 mmol/L (ref 100–111)
Creatinine: 0.67 MG/DL (ref 0.6–1.3)
EGFR IF NonAfrican American: >60 mL/min/{1.73_m2} (ref >60)
GFR African American: >60 mL/min/{1.73_m2} (ref >60)
Globulin: 2.8 g/dL (ref 2.0–4.0)
Glucose: 123 mg/dL — ABNORMAL HIGH (ref 74–99)
Potassium: 4.5 mmol/L (ref 3.5–5.5)
Sodium: 142 mmol/L (ref 136–145)
Total Bilirubin: 0.5 MG/DL (ref 0.2–1.0)
Total Protein: 6.6 g/dL (ref 6.4–8.2)

## 2020-09-03 LAB — HEMOGLOBIN A1C W/EAG
Hemoglobin A1C: 5.6 % (ref 4.2–5.6)
eAG: 114 mg/dL

## 2020-09-03 LAB — METABOLIC PANEL, COMPREHENSIVE
A-G Ratio: 1.4 (ref 0.8–1.7)
ALT (SGPT): 20 U/L (ref 13–56)
AST (SGOT): 14 U/L (ref 10–38)
Albumin: 3.8 g/dL (ref 3.4–5.0)
Alk. phosphatase: 92 U/L (ref 45–117)
Anion gap: 3 mmol/L (ref 3.0–18)
BUN/Creatinine ratio: 21 — ABNORMAL HIGH (ref 12–20)
BUN: 14 MG/DL (ref 7.0–18)
Bilirubin, total: 0.5 MG/DL (ref 0.2–1.0)
CO2: 29 mmol/L (ref 21–32)
Calcium: 9.1 MG/DL (ref 8.5–10.1)
Chloride: 110 mmol/L (ref 100–111)
Creatinine: 0.67 MG/DL (ref 0.6–1.3)
GFR est AA: >60 mL/min/{1.73_m2} (ref >60)
GFR est non-AA: >60 mL/min/{1.73_m2} (ref >60)
Globulin: 2.8 g/dL (ref 2.0–4.0)
Glucose: 123 mg/dL — ABNORMAL HIGH (ref 74–99)
Potassium: 4.5 mmol/L (ref 3.5–5.5)
Protein, total: 6.6 g/dL (ref 6.4–8.2)
Sodium: 142 mmol/L (ref 136–145)

## 2020-09-03 LAB — HEMOGLOBIN A1C WITH EAG
Est. average glucose: 114 mg/dL
Hemoglobin A1c: 5.6 % (ref 4.2–5.6)

## 2020-09-03 NOTE — Progress Notes (Signed)
To be discussed on visit 4/20

## 2020-09-04 ENCOUNTER — Ambulatory Visit: Attending: Family Medicine | Primary: Family Medicine

## 2020-09-04 ENCOUNTER — Ambulatory Visit: Admit: 2020-09-04 | Discharge: 2020-09-04 | Payer: MEDICARE | Attending: Family Medicine | Primary: Family Medicine

## 2020-09-04 ENCOUNTER — Inpatient Hospital Stay: Admit: 2020-09-04 | Payer: MEDICARE | Attending: Family Medicine | Primary: Family Medicine

## 2020-09-04 DIAGNOSIS — I517 Cardiomegaly: Secondary | ICD-10-CM

## 2020-09-04 DIAGNOSIS — E119 Type 2 diabetes mellitus without complications: Secondary | ICD-10-CM

## 2020-09-04 LAB — TRANSTHORACIC ECHOCARDIOGRAM (TTE) COMPLETE (CONTRAST/BUBBLE/3D PRN)
Ao Root Index: 1.59 cm/m2
Aortic Root: 3 cm
Ascending Aorta Index: 1.64 cm/m2
Ascending Aorta: 3.1 cm
E/E' Lateral: 9.57
E/E' Ratio (Averaged): 9.57
E/E' Septal: 9.57
Est. RA Pressure: 3 mmHg
Fractional Shortening 2D: 26 % (ref 28–44)
IVSd: 1.4 cm — AB (ref 0.6–0.9)
LA Volume 2C: 70 mL — AB (ref 22–52)
LA Volume 4C: 24 mL (ref 22–52)
LA Volume A/L: 49 mL
LA Volume Index 2C: 37 mL/m2 — AB (ref 16–34)
LA Volume Index 4C: 13 mL/m2 — AB (ref 16–34)
LA Volume Index A/L: 26 mL/m2 (ref 16–34)
LV E' Lateral Velocity: 7 cm/s
LV E' Septal Velocity: 7 cm/s
LV Mass 2D Index: 86.2 g/m2 (ref 43–95)
LV Mass 2D: 163 g — AB (ref 67–162)
LV RWT Ratio: 0.58
LVIDd Index: 2.01 cm/m2
LVIDd: 3.8 cm — AB (ref 3.9–5.3)
LVIDs Index: 1.48 cm/m2
LVIDs: 2.8 cm
LVOT Area: 2.8 cm2
LVOT Diameter: 1.9 cm
LVOT Mean Gradient: 2 mmHg
LVOT Peak Gradient: 4 mmHg
LVOT Peak Velocity: 1 m/s
LVOT SV: 58.7 ml
LVOT Stroke Volume Index: 31 mL/m2
LVOT VTI: 20.7 cm
LVPWd: 1.1 cm — AB (ref 0.6–0.9)
Left Ventricular Ejection Fraction: 58
MV A Velocity: 0.67 m/s
MV E Velocity: 0.67 m/s
MV E Wave Deceleration Time: 238.1 ms
MV E/A: 1
TAPSE: 2 cm

## 2020-09-04 LAB — ECHO ADULT COMPLETE
Ao Root Index: 1.59 cm/m2
Aortic Root: 3 cm
Ascending Aorta Index: 1.64 cm/m2
Ascending Aorta: 3.1 cm
E/E' Lateral: 9.57
E/E' Ratio (Averaged): 9.57
E/E' Septal: 9.57
Est. RA Pressure: 3 mmHg
Fractional Shortening 2D: 26 % (ref 28–44)
IVSd: 1.4 cm — AB (ref 0.6–0.9)
LA Volume 2C: 70 mL — AB (ref 22–52)
LA Volume 4C: 24 mL (ref 22–52)
LA Volume A/L: 49 mL
LA Volume Index 2C: 37 mL/m2 — AB (ref 16–34)
LA Volume Index 4C: 13 mL/m2 — AB (ref 16–34)
LA Volume Index A/L: 26 mL/m2 (ref 16–34)
LV E' Lateral Velocity: 7 cm/s
LV E' Septal Velocity: 7 cm/s
LV Mass 2D Index: 86.2 g/m2 (ref 43–95)
LV Mass 2D: 163 g — AB (ref 67–162)
LV RWT Ratio: 0.58
LVIDd Index: 2.01 cm/m2
LVIDd: 3.8 cm — AB (ref 3.9–5.3)
LVIDs Index: 1.48 cm/m2
LVIDs: 2.8 cm
LVOT Area: 2.8 cm2
LVOT Diameter: 1.9 cm
LVOT Mean Gradient: 2 mmHg
LVOT Peak Gradient: 4 mmHg
LVOT Peak Velocity: 1 m/s
LVOT SV: 58.7 ml
LVOT Stroke Volume Index: 31 mL/m2
LVOT VTI: 20.7 cm
LVPWd: 1.1 cm — AB (ref 0.6–0.9)
MV A Velocity: 0.67 m/s
MV E Velocity: 0.67 m/s
MV E Wave Deceleration Time: 238.1 ms
MV E/A: 1
TAPSE: 2 cm (ref 1.7–?)

## 2020-09-04 NOTE — Progress Notes (Signed)
1. Have you been to the ER, urgent care clinic since your last visit?  Hospitalized since your last visit?No    2. Have you seen or consulted any other health care providers outside of the [facility] since your last visit?  Include any pap smears or colon screening. No

## 2020-09-04 NOTE — Progress Notes (Signed)
Cheryl Paul, 70 y.o.,  female    SUBJECTIVE  Ff-up     On routine screening, noted fluid overload suggestive of CHF.  She denies previous hx/o.  She denies any sob, chest pain, leg edema, pillow orthopnea. She has known DM/HTN/COPD/asthma.  Reviewed echo 2019 left atrial enlargement/EF 60%  Labs/ekg/cxr including pro BNP normal  Had echo done today, await results    ROS:  See HPI, all others negative        Patient Active Problem List   Diagnosis Code   ??? 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 Type 2 diabetes mellitus without complication (HCC) E11.9   ??? Incontinence in female R32   ??? Candidal intertrigo B37.2   ??? 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 Essential hypertension I10   ??? COPD (chronic obstructive pulmonary disease) (HCC) J44.9   ??? Hyperlipidemia E78.5   ??? Vitamin D deficiency E55.9   ??? Internal hemorrhoids K64.8   ??? Colon polyps K63.5   ??? Hypokalemia E87.6   ??? Hypomagnesemia E83.42   ??? Syncope R55   ??? Elevated troponin R77.8   ??? 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 Hypocalcemia E83.51   ??? Mild intermittent asthma J45.20   ??? Thrombocytopenia, unspecified D69.6       Current Outpatient Medications   Medication Sig Dispense Refill   ??? potassium chloride (K-DUR, KLOR-CON M20) 20 mEq tablet Take 2 Tablets by mouth three (3) times daily. 270 Tablet 0   ??? spironolactone (ALDACTONE) 25 mg tablet TAKE 1 TABLET EVERY DAY 90 Tablet 0   ??? nystatin (MYCOSTATIN) topical cream Apply  to affected area two (2) times a day. 15 g 1   ??? ergocalciferol (ERGOCALCIFEROL) 1,250 mcg (50,000 unit) capsule Take 1 Capsule by mouth every seven (7) days. 12 Capsule 3   ??? diclofenac (VOLTAREN) 1 % gel Apply 4 g to affected area four (4) times daily as needed for Pain. 1 Each 3   ??? magnesium oxide (MAG-OX) 400 mg tablet Take 1 Tablet by mouth daily. 90 Tablet 0   ??? calcium citrate 200 mg (950 mg) tablet Take 1 Tab by mouth daily. Indications: malabsorption 30 Tab 0   ??? multivitamin (ONE A DAY) tablet Take 1 Tab by mouth daily.     ???  thiamine HCL (B-1) 100 mg tablet Take  by mouth daily.     ??? cyanocobalamin 1,000 mcg tablet Take 1,000 mcg by mouth daily.     ??? cholecalciferol, VITAMIN D3, (VITAMIN D3) 5,000 unit tab tablet Take 5,000 Units by mouth daily. (Patient not taking: Reported on 09/04/2020)     ??? albuterol (PROVENTIL HFA, VENTOLIN HFA, PROAIR HFA) 90 mcg/actuation inhaler Take 1-2 Puffs by inhalation every four (4) hours as needed for Wheezing. (Patient not taking: Reported on 03/04/2020) 1 Inhaler 0   ??? alcohol swabs padm Use daily to test blood sugar  DX E11.9 (Patient not taking: Reported on 08/06/2020) 100 Pad 1       Allergies   Allergen Reactions   ??? Latex Rash   ??? Penicillins Itching   ??? Acetaminophen-Codeine Nausea Only and Other (comments)     Nausea and pains in stomach   ??? Butrans [Buprenorphine] Rash   ??? Codeine Other (comments)     Nausea and abd pain   ??? Iodine Rash   ??? Lactose Diarrhea     Lactose intolerance   ??? Other Plant, Educational psychologist, Environmental Hives     Conductive electrodes  Past Medical History:   Diagnosis Date   ??? Arthritis     knee, arms,    ??? Asthma    ??? Chronic obstructive pulmonary disease (HCC)    ??? Chronic pain    ??? Diabetes (HCC)    ??? H/O sinusitis    ??? Hypercholesterolemia    ??? Hypertension    ??? Sleep apnea     NOT USING    ??? Stress incontinence    ??? Toe fracture, left August 2015    4th toe       Social History     Socioeconomic History   ??? Marital status: SINGLE     Spouse name: Not on file   ??? Number of children: Not on file   ??? Years of education: Not on file   ??? Highest education level: Not on file   Occupational History   ??? Not on file   Tobacco Use   ??? Smoking status: Never Smoker   ??? Smokeless tobacco: Never Used   Substance and Sexual Activity   ??? Alcohol use: No     Alcohol/week: 0.0 standard drinks   ??? Drug use: No   ??? Sexual activity: Not Currently   Other Topics Concern   ??? 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 Denies  history of smoking      Social Determinants of Psychologist, prison and probation services Strain:    ??? Difficulty of Paying Living Expenses: Not on file   Food Insecurity:    ??? Worried About Programme researcher, broadcasting/film/video in the Last Year: Not on file   ??? Ran Out of Food in the Last Year: Not on file   Transportation Needs:    ??? Lack of Transportation (Medical): Not on file   ??? Lack of Transportation (Non-Medical): Not on file   Physical Activity:    ??? Days of Exercise per Week: Not on file   ??? Minutes of Exercise per Session: Not on file   Stress:    ??? Feeling of Stress : Not on file   Social Connections:    ??? Frequency of Communication with Friends and Family: Not on file   ??? Frequency of Social Gatherings with Friends and Family: Not on file   ??? Attends Religious Services: Not on file   ??? Active Member of Clubs or Organizations: Not on file   ??? Attends Banker Meetings: Not on file   ??? Marital Status: Not on file   Intimate Partner Violence:    ??? Fear of Current or Ex-Partner: Not on file   ??? Emotionally Abused: Not on file   ??? Physically Abused: Not on file   ??? Sexually Abused: Not on file   Housing Stability:    ??? Unable to Pay for Housing in the Last Year: Not on file   ??? Number of Places Lived in the Last Year: Not on file   ??? Unstable Housing in the Last Year: Not on file       Family History   Problem Relation Age of Onset   ??? Hypertension Mother    ??? Stroke Mother    ??? Diabetes Mother    ??? Thyroid Disease Mother    ??? Arthritis-rheumatoid Mother    ??? Cancer Father         colon polpe   ??? Cancer Maternal Aunt  OBJECTIVE    Physical Exam:     Visit Vitals  BP 128/80 (BP 1 Location: Left upper arm, BP Patient Position: Sitting, BP Cuff Size: Adult)   Pulse 63   Temp 97.5 ??F (36.4 ??C) (Temporal)   Resp 16   Ht 5\' 3"  (1.6 m)   Wt 191 lb (86.6 kg)   SpO2 99%   BMI 33.83 kg/m??       General: alert, well-appearing,  in no apparent distress or pain  Head: atraumatic. Non-tender maxillary and frontal sinuses  Neck:  supple, no adenopathy palpated  CVS: normal rate, regular rhythm, distinct S1 and S2  Lungs:clear to ausculation bilaterally, no crackles, wheezing or rhonchi noted  Breasts: breasts appear normal, no suspicious masses, no skin or nipple changes or axillary nodes.  Abdomen: normoactive bowel sounds, soft, non-tender  Extremities: no edema, no cyanosis, MSK grossly normal  Skin: warm, no lesions, rashes noted  Psych:  mood and affect normal    ASSESSMENT/PLAN  Diagnoses and all orders for this visit:    1. Weight gain  Clinically no symptoms of CHF, aside from weight gain  Has hx left atrial enlargement on echo 2019, await updated echo done today  Labs/ekg/cxr without significant findings  Will monitor for now    2. Essential hypertension  Controlled    3. 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 Intestinal malabsorption, unspecified type  -     CBC WITH AUTOMATED DIFF; Future  -     VITAMIN D, 25 HYDROXY; Future  -     VITAMIN B1, WHOLE BLOOD; Future  -     VITAMIN B12 & FOLATE; Future  -     IRON PROFILE; Future  -     FERRITIN; Future    5. Vitamin D deficiency  -     VITAMIN D, 25 HYDROXY; Future          Follow-up and Dispositions    ?? Return in about 4 months (around 01/04/2021), or if symptoms worsen or fail to improve, for routine chronic illness care, non-fasting labs prior to your next visit.           Patient understands plan of care. Patient has provided input and agrees with goals.

## 2020-09-12 ENCOUNTER — Other Ambulatory Visit: Payer: Self-pay | Admitting: Podiatry

## 2020-09-12 DIAGNOSIS — S93692A Other sprain of left foot, initial encounter: Secondary | ICD-10-CM

## 2020-09-13 ENCOUNTER — Ambulatory Visit
Admission: RE | Admit: 2020-09-13 | Discharge: 2020-09-13 | Disposition: A | Discharge status: Home / Self Care | Payer: Medicare HMO | Source: Ambulatory Visit | Attending: Podiatry | Admitting: Podiatry

## 2020-09-13 ENCOUNTER — Other Ambulatory Visit: Payer: Self-pay

## 2020-09-13 DIAGNOSIS — S93692A Other sprain of left foot, initial encounter: Secondary | ICD-10-CM | POA: Diagnosis present

## 2020-09-16 ENCOUNTER — Other Ambulatory Visit: Payer: Self-pay | Admitting: Podiatry

## 2020-09-30 ENCOUNTER — Encounter: Attending: Family Medicine | Primary: Family Medicine

## 2020-10-01 ENCOUNTER — Encounter: Payer: Self-pay | Admitting: Podiatry

## 2020-10-08 ENCOUNTER — Other Ambulatory Visit: Admission: RE | Admit: 2020-10-08 | Payer: Medicare HMO | Source: Ambulatory Visit

## 2020-10-09 ENCOUNTER — Encounter

## 2020-10-09 NOTE — Telephone Encounter (Signed)
June 6/7/9/or10th are the dates they would for pt to have an appt.      Date of surgery  11/04/20  Type of surgery  left total knee replacement  Fac[facility] that procedure will be performed  [facility]  Doctor's name that will perform procedure Dr. Betha Loa  Anesthesia type  General  Pre op testing date  [facility] 10/16/20  Clear patient in note of forms will be faxed to [facility] yes  Point of contact in specialty office:   nameRobin    phone [phone]    fax

## 2020-10-10 ENCOUNTER — Ambulatory Visit: Payer: Medicare HMO | Admitting: Anesthesiology

## 2020-10-10 ENCOUNTER — Encounter: Payer: Self-pay | Admitting: Podiatry

## 2020-10-10 ENCOUNTER — Ambulatory Visit
Admission: RE | Admit: 2020-10-10 | Discharge: 2020-10-10 | Disposition: A | Discharge status: Home / Self Care | Payer: Medicare HMO | Attending: Podiatry | Admitting: Podiatry

## 2020-10-10 ENCOUNTER — Encounter
Admission: RE | Disposition: A | Discharge status: Home / Self Care | Payer: Self-pay | Source: Home / Self Care | Attending: Podiatry

## 2020-10-10 ENCOUNTER — Other Ambulatory Visit: Payer: Self-pay

## 2020-10-10 DIAGNOSIS — M722 Plantar fascial fibromatosis: Secondary | ICD-10-CM | POA: Insufficient documentation

## 2020-10-10 DIAGNOSIS — K219 Gastro-esophageal reflux disease without esophagitis: Secondary | ICD-10-CM | POA: Diagnosis not present

## 2020-10-10 DIAGNOSIS — I10 Essential (primary) hypertension: Secondary | ICD-10-CM | POA: Diagnosis not present

## 2020-10-10 DIAGNOSIS — Z87891 Personal history of nicotine dependence: Secondary | ICD-10-CM | POA: Insufficient documentation

## 2020-10-10 DIAGNOSIS — Z09 Encounter for follow-up examination after completed treatment for conditions other than malignant neoplasm: Secondary | ICD-10-CM

## 2020-10-10 DIAGNOSIS — Z79899 Other long term (current) drug therapy: Secondary | ICD-10-CM | POA: Insufficient documentation

## 2020-10-10 DIAGNOSIS — Z885 Allergy status to narcotic agent status: Secondary | ICD-10-CM | POA: Diagnosis not present

## 2020-10-10 DIAGNOSIS — Z859 Personal history of malignant neoplasm, unspecified: Secondary | ICD-10-CM | POA: Diagnosis not present

## 2020-10-10 DIAGNOSIS — E039 Hypothyroidism, unspecified: Secondary | ICD-10-CM | POA: Diagnosis not present

## 2020-10-10 HISTORY — DX: Personal history of other diseases of the digestive system: Z87.19

## 2020-10-10 HISTORY — DX: Family history of other specified conditions: Z84.89

## 2020-10-10 HISTORY — DX: Presence of dental prosthetic device (complete) (partial): Z97.2

## 2020-10-10 HISTORY — PX: PLANTAR FASCIA RELEASE: SHX2239

## 2020-10-10 SURGERY — RELEASE, FASCIA, PLANTAR
Anesthesia: General | Site: Foot | Laterality: Left

## 2020-10-10 MED ORDER — FENTANYL CITRATE (PF) 100 MCG/2ML IJ SOLN
INTRAMUSCULAR | Status: DC | PRN
  Administered 2020-10-10: 25 ug via INTRAVENOUS

## 2020-10-10 MED ORDER — POVIDONE-IODINE 7.5 % EX SOLN: Freq: Once | CUTANEOUS | Status: DC

## 2020-10-10 MED ORDER — OXYCODONE HCL 5 MG PO TABS
5.0000 mg | ORAL_TABLET | Freq: Once | ORAL | Status: AC | PRN
  Administered 2020-10-10: 5 mg via ORAL

## 2020-10-10 MED ORDER — BUPIVACAINE HCL 0.25 % IJ SOLN
INTRAMUSCULAR | Status: DC | PRN
  Administered 2020-10-10 (×2): 10 mL

## 2020-10-10 MED ORDER — FENTANYL CITRATE (PF) 100 MCG/2ML IJ SOLN: 25.0000 ug | INTRAMUSCULAR | Status: DC | PRN

## 2020-10-10 MED ORDER — ONDANSETRON HCL 4 MG/2ML IJ SOLN
INTRAMUSCULAR | Status: DC | PRN
  Administered 2020-10-10: 4 mg via INTRAVENOUS

## 2020-10-10 MED ORDER — PROPOFOL 10 MG/ML IV BOLUS
INTRAVENOUS | Status: DC | PRN
  Administered 2020-10-10: 100 mg via INTRAVENOUS

## 2020-10-10 MED ORDER — LACTATED RINGERS IV SOLN: INTRAVENOUS | Status: DC

## 2020-10-10 MED ORDER — MIDAZOLAM HCL 2 MG/2ML IJ SOLN
INTRAMUSCULAR | Status: DC | PRN
  Administered 2020-10-10 (×2): 1 mg via INTRAVENOUS

## 2020-10-10 MED ORDER — PROMETHAZINE HCL 25 MG/ML IJ SOLN: 6.2500 mg | INTRAMUSCULAR | Status: DC | PRN

## 2020-10-10 MED ORDER — BUPIVACAINE LIPOSOME 1.3 % IJ SUSP
INTRAMUSCULAR | Status: DC | PRN
  Administered 2020-10-10: 10 mL

## 2020-10-10 MED ORDER — ONDANSETRON HCL 4 MG PO TABS: 4.0000 mg | ORAL_TABLET | Freq: Three times a day (TID) | ORAL | 0 refills | Status: DC | PRN

## 2020-10-10 MED ORDER — PROPOFOL 500 MG/50ML IV EMUL
INTRAVENOUS | Status: DC | PRN
  Administered 2020-10-10: 100 ug/kg/min via INTRAVENOUS

## 2020-10-10 MED ORDER — ASPIRIN EC 81 MG PO TBEC: 81.0000 mg | DELAYED_RELEASE_TABLET | Freq: Two times a day (BID) | ORAL | 0 refills | Status: DC

## 2020-10-10 MED ORDER — HYDROCODONE-ACETAMINOPHEN 7.5-325 MG PO TABS: 1.0000 | ORAL_TABLET | Freq: Four times a day (QID) | ORAL | 0 refills | Status: AC | PRN

## 2020-10-10 MED ORDER — ASPIRIN EC 81 MG PO TBEC: 81.0000 mg | DELAYED_RELEASE_TABLET | Freq: Two times a day (BID) | ORAL | 0 refills | Status: AC

## 2020-10-10 MED ORDER — LIDOCAINE HCL (CARDIAC) PF 100 MG/5ML IV SOSY
PREFILLED_SYRINGE | INTRAVENOUS | Status: DC | PRN
  Administered 2020-10-10: 30 mg via INTRATRACHEAL

## 2020-10-10 MED ORDER — OXYCODONE HCL 5 MG/5ML PO SOLN: 5.0000 mg | Freq: Once | ORAL | Status: AC | PRN

## 2020-10-10 MED ORDER — CEFAZOLIN SODIUM-DEXTROSE 2-4 GM/100ML-% IV SOLN
2.0000 g | INTRAVENOUS | Status: AC
  Administered 2020-10-10: 2 g via INTRAVENOUS

## 2020-10-10 SURGICAL SUPPLY — 30 items
BNDG CMPR STD VLCR NS LF 5.8X4 (GAUZE/BANDAGES/DRESSINGS) ×1
BNDG COHESIVE 4X5 TAN STRL (GAUZE/BANDAGES/DRESSINGS) ×2 IMPLANT
BNDG ELASTIC 4X5.8 VLCR NS LF (GAUZE/BANDAGES/DRESSINGS) ×3 IMPLANT
BNDG ESMARK 4X12 TAN STRL LF (GAUZE/BANDAGES/DRESSINGS) ×2 IMPLANT
BNDG GAUZE 4.5X4.1 6PLY STRL (MISCELLANEOUS) ×2 IMPLANT
CANISTER SUCT 1200ML W/VALVE (MISCELLANEOUS) ×2 IMPLANT
COVER LIGHT HANDLE UNIVERSAL (MISCELLANEOUS) ×4 IMPLANT
CUFF TOURN SGL QUICK 18X4 (TOURNIQUET CUFF) ×2 IMPLANT
DURAPREP 26ML APPLICATOR (WOUND CARE) ×2 IMPLANT
GAUZE SPONGE 4X4 12PLY STRL (GAUZE/BANDAGES/DRESSINGS) ×2 IMPLANT
GAUZE XEROFORM 1X8 LF (GAUZE/BANDAGES/DRESSINGS) ×2 IMPLANT
GLOVE SURG ENC MOIS LTX SZ7 (GLOVE) ×2 IMPLANT
GLOVE SURG POLYISO LF SZ7 (GLOVE) ×2 IMPLANT
GOWN STRL REUS W/ TWL LRG LVL3 (GOWN DISPOSABLE) ×2 IMPLANT
GOWN STRL REUS W/TWL LRG LVL3 (GOWN DISPOSABLE) ×6
IV NS 500ML (IV SOLUTION) ×2
IV NS 500ML BAXH (IV SOLUTION) IMPLANT
K-WIRE DBL END TROCAR 6X.062 (WIRE) ×2
KIT CARPAL TUNNEL (MISCELLANEOUS) ×2
KIT PRC PRB RTRGD 3ANG KNF HND (MISCELLANEOUS) ×1 IMPLANT
KIT TURNOVER KIT A (KITS) ×2 IMPLANT
KWIRE DBL END TROCAR 6X.062 (WIRE) IMPLANT
NS IRRIG 500ML POUR BTL (IV SOLUTION) ×2 IMPLANT
PACK EXTREMITY ARMC (MISCELLANEOUS) ×2 IMPLANT
STOCKINETTE IMPERVIOUS LG (DRAPES) ×2 IMPLANT
SUT ETHILON 3-0 (SUTURE) ×2 IMPLANT
SYR 10ML LL (SYRINGE) ×1 IMPLANT
SYR 20ML LL LF (SYRINGE) ×1 IMPLANT
SYR 5ML 18GX1 1/2 (NEEDLE) ×1 IMPLANT
WAND TENDON TOPAZ 0 ANGL (MISCELLANEOUS) ×1 IMPLANT

## 2020-10-10 NOTE — Anesthesia Preprocedure Evaluation (Signed)
Anesthesia Evaluation  Patient identified by MRN, date of birth, ID band Patient awake    Reviewed: Allergy & Precautions, NPO status , Patient's Chart, lab work & pertinent test results, reviewed documented beta blocker date and time   History of Anesthesia Complications (+) PONV and history of anesthetic complications  Airway Mallampati: III  TM Distance: >3 FB Neck ROM: Limited    Dental  (+) Partial Upper, Lower Dentures   Pulmonary COPD, former smoker,    breath sounds clear to auscultation       Cardiovascular (-) angina(-) DOE  Rhythm:Regular Rate:Normal   HLD   Neuro/Psych PSYCHIATRIC DISORDERS Anxiety Depression CVA    GI/Hepatic hiatal hernia, GERD  ,  Endo/Other  Hypothyroidism   Renal/GU      Musculoskeletal  (+) Arthritis ,   Abdominal   Peds  Hematology  (+) anemia ,   Anesthesia Other Findings   Reproductive/Obstetrics                             Anesthesia Physical Anesthesia Plan  ASA: III  Anesthesia Plan: General   Post-op Pain Management:    Induction: Intravenous  PONV Risk Score and Plan: 4 or greater and Treatment may vary due to age or medical condition, Ondansetron, Dexamethasone, Propofol infusion and TIVA  Airway Management Planned: LMA  Additional Equipment:   Intra-op Plan:   Post-operative Plan: Extubation in OR  Informed Consent: I have reviewed the patients History and Physical, chart, labs and discussed the procedure including the risks, benefits and alternatives for the proposed anesthesia with the patient or authorized representative who has indicated his/her understanding and acceptance.       Plan Discussed with: CRNA and Anesthesiologist  Anesthesia Plan Comments:         Anesthesia Quick Evaluation

## 2020-10-10 NOTE — Op Note (Signed)
PODIATRY / FOOT AND ANKLE SURGERY OPERATIVE REPORT    SURGEON: [name], DPM  PRE-OPERATIVE DIAGNOSIS:  1.  Chronic left plantar fasciitis  POST-OPERATIVE DIAGNOSIS: Same  PROCEDURE(S): 1. Left endoscopic plantar fasciotomy with Topaz debridement  HEMOSTASIS: Left ankle tourniquet  ANESTHESIA: MAC  ESTIMATED BLOOD LOSS: 5 cc  FINDING(S): 1.  Severely thickened and fatty left plantar fascia  PATHOLOGY/SPECIMEN(S): None  INDICATIONS:   Brittany [name] is a 70 y.o. female who presents with chronic left plantar heel pain.  Patient has been treated previously for plantar fasciitis and came into the office fairly recently after feeling a pop at the bottom of her foot near the plantar fascia.  X-ray imaging and MRI imaging was performed due to this and it was noted that the patient had a partial tear in the central band of the plantar fascia and appeared to have severe thickening of the plantar fascial consistent with plantar fasciitis.  Discussed all treatment options with patient both conservative and surgical attempts at correction including risks and complications.  No guarantees given.  Consent signed and placed in chart.  Patient has consented for procedure described above..  DESCRIPTION: After obtaining full informed written consent, the patient was brought back to the operating room and placed supine upon the operating table.  The patient received IV antibiotics prior to induction.  After obtaining adequate anesthesia, 20 cc of quarter percent Marcaine plain was injected about the operative site and about the tibial nerve distribution.  The patient was prepped and draped in the standard fashion.  The Esmarch bandage was used to exsanguinate the left lower extremity and pneumatic ankle tourniquet was inflated.  Attention was then directed to the medial aspect of the left heel slightly distal to the origin of the plantar fascia where a small 1 cm incision was made.  The incision was  deepened to the subcutaneous tissues utilizing blunt dissection.  Blunt dissection was continued underneath the plantar fascia from medial to lateral reaching the lateral aspect of the heel.  The obturator and cannula was then placed.  A small stab incision was then made that was about 1 cm or so at the lateral aspect of the heel and the obturator and cannula was passed through the lateral stab incision.  The cannula was left in place but the obturator was removed.  The cannula was then cleaned with cotton tip applicators and was suction.  At this time the scope was then placed through the lateral heel incision and the plantar fascia was able to be visualized and photos were taken.  At this time the triangular hook blade was used to resect two thirds the central band and all of the medial band of the plantar fascia while holding the foot in a dorsiflexed position.  The flexor digitorum brevis muscle belly was able to be identified indicating successful release of the plantar fascia.  The ligament did appear to be very thickened and appeared to have some fatty degeneration of the ligament itself.  A portion of the partial tear was noted at the central band of the plantar fascia prior to release.  The surgical site was then flushed with copious amounts normal sterile saline after the instrumentation was removed.  The 2 incisions were then reapproximated well coapted with 3-0 nylon in simple type stitching.  Attention was then directed to the plantar heel where a 5 x 4 grid was drawn out on the heel plantarly.  A 0.062 K wire was then used to  create small pilot holes in these areas.  The Topaz microdebrider was then placed through the holes and directed in 3 different directions through each hole debriding the plantar fascia further.  Additionally 10 cc of Exparel was injected about the operative sites.  The pneumatic ankle tourniquet was deflated and a prompt hyperemic response was noted all digits left foot.  A  postoperative dressing was applied consisting of Xeroform to the incision sites and plantar heel followed by 4 x 4 gauze, Kling, Kerlix, Ace wrap.  Patient tolerated the procedure and anesthesia well was transferred to recovery room vital signs stable vascular status intact all toes left foot.  Following.  Postoperative monitoring the patient will be discharged home with the appropriate orders and follow-up instructions.  Patient is to remain nonweightbearing for around 1 month postoperatively and to be in a boot and use knee scooter to stay at the foot.  Medications have been sent to the pharmacy.  Discussed with family in detail after procedure.  Patient to follow-up within 1 week of surgical date in clinic.  Patient to call if any further problems arise.  COMPLICATIONS: None  CONDITION: Good, stable  [name], DPM

## 2020-10-10 NOTE — Transfer of Care (Signed)
Immediate Anesthesia Transfer of Care Note  Patient: Brittany [name]  Procedure(s) Performed: ENDOSCOPIC PLANTAR FASC. RELEASE (Left Foot)  Patient Location: PACU  Anesthesia Type: General  Level of Consciousness: awake, alert  and patient cooperative  Airway and Oxygen Therapy: Patient Spontanous Breathing and Patient connected to supplemental oxygen  Post-op Assessment: Post-op Vital signs reviewed, Patient's Cardiovascular Status Stable, Respiratory Function Stable, Patent Airway and No signs of Nausea or vomiting  Post-op Vital Signs: Reviewed and stable  Complications: No complications documented.

## 2020-10-10 NOTE — H&P (Signed)
HISTORY AND PHYSICAL INTERVAL NOTE:  10/10/2020  7:12 AM  Brittany [name]  has presented today for surgery, with the diagnosis of M21.3X- EQUINUS M72.2- PLANTAR FASCIITIS, LEFT.  The various methods of treatment have been discussed with the patient.  No guarantees were given.  After consideration of risks, benefits and other options for treatment, the patient has consented to surgery.  I have reviewed the patients' chart and labs.    PROCEDURE: LEFT ENDOSCOPIC PLANTAR FASCIOTOMY, TOPAZ DEBRIDEMENT  A history and physical examination was performed in my office.  The patient was reexamined.  There have been no changes to this history and physical examination.  [name], DPM

## 2020-10-10 NOTE — Anesthesia Procedure Notes (Signed)
Date/Time: 10/10/2020 7:36 AM Performed by: Cameron Ali, CRNA Pre-anesthesia Checklist: Patient identified, Emergency Drugs available, Suction available, Timeout performed and Patient being monitored Patient Re-evaluated:Patient Re-evaluated prior to induction Oxygen Delivery Method: Nasal cannula Placement Confirmation: positive ETCO2

## 2020-10-10 NOTE — Discharge Instructions (Signed)
[place]  POST OPERATIVE INSTRUCTIONS FOR DR. Vickki Muff AND DR. [place]   1. Take your medication as prescribed.  Pain medication should be taken only as needed.  You may also take ibuprofen and Tylenol between pain medication doses if pain is uncontrolled.  Take aspirin 81 mg twice daily starting tomorrow.  2. Keep the dressing clean, dry and intact.  Keep off of the left foot at all times.  Use the cam boot when up and moving around.  Also use knee scooter that was discussed prior to surgery.  3. Keep your foot elevated above the heart level for the first 48 hours.  Continue elevation thereafter to improve swelling.  4. Walking to the bathroom and brief periods of walking are acceptable, unless we have instructed you to be non-weight bearing.  5. Always wear your post-op boot when walking.  Always use your crutches, knee scooter, or wheelchair if you are to be non-weight bearing.  6. Do not take a shower. Baths are permissible as long as the foot is kept out of the water.   7. Every hour you are awake:  - Bend your knee 15 times. - Flex foot 15 times - Massage calf 15 times  8. Call [facility] [phone]) if any of the following problems occur: - You develop a temperature or fever. - The bandage becomes saturated with blood. - Medication does not stop your pain. - Injury of the foot occurs. - Any symptoms of infection including redness, odor, or red streaks running from wound.   General Anesthesia, Adult, Care After This sheet gives you information about how to care for yourself after your procedure. Your health care provider may also give you more specific instructions. If you have problems or questions, contact your health care provider. What can I expect after the procedure? After the procedure, the following side effects are common:  Pain or discomfort at the IV  site.  Nausea.  Vomiting.  Sore throat.  Trouble concentrating.  Feeling cold or chills.  Feeling weak or tired.  Sleepiness and fatigue.  Soreness and body aches. These side effects can affect parts of the body that were not involved in surgery. Follow these instructions at home: For the time period you were told by your health care provider:  Rest.  Do not participate in activities where you could fall or become injured.  Do not drive or use machinery.  Do not drink alcohol.  Do not take sleeping pills or medicines that cause drowsiness.  Do not make important decisions or sign legal documents.  Do not take care of children on your own.   Eating and drinking  Follow any instructions from your health care provider about eating or drinking restrictions.  When you feel hungry, start by eating small amounts of foods that are soft and easy to digest (bland), such as toast. Gradually return to your regular diet.  Drink enough fluid to keep your urine pale yellow.  If you vomit, rehydrate by drinking water, juice, or clear broth. General instructions  If you have sleep apnea, surgery and certain medicines can increase your risk for breathing problems. Follow instructions from your health care provider about wearing your sleep device: ? Anytime you are sleeping, including during daytime naps. ? While taking prescription pain medicines, sleeping medicines, or medicines that make you drowsy.  Have a responsible adult stay with you for the time you are told. It is important  to have someone help care for you until you are awake and alert.  Return to your normal activities as told by your health care provider. Ask your health care provider what activities are safe for you.  Take over-the-counter and prescription medicines only as told by your health care provider.  If you smoke, do not smoke without supervision.  Keep all follow-up visits as told by your health care  provider. This is important. Contact a health care provider if:  You have nausea or vomiting that does not get better with medicine.  You cannot eat or drink without vomiting.  You have pain that does not get better with medicine.  You are unable to pass urine.  You develop a skin rash.  You have a fever.  You have redness around your IV site that gets worse. Get help right away if:  You have difficulty breathing.  You have chest pain.  You have blood in your urine or stool, or you vomit blood. Summary  After the procedure, it is common to have a sore throat or nausea. It is also common to feel tired.  Have a responsible adult stay with you for the time you are told. It is important to have someone help care for you until you are awake and alert.  When you feel hungry, start by eating small amounts of foods that are soft and easy to digest (bland), such as toast. Gradually return to your regular diet.  Drink enough fluid to keep your urine pale yellow.  Return to your normal activities as told by your health care provider. Ask your health care provider what activities are safe for you. This information is not intended to replace advice given to you by your health care provider. Make sure you discuss any questions you have with your health care provider. Document Revised: 01/18/2020 Document Reviewed: 08/17/2019 Elsevier Patient Education  2021 [employer].

## 2020-10-10 NOTE — Anesthesia Postprocedure Evaluation (Signed)
Anesthesia Post Note  Patient: Brittany [name]  Procedure(s) Performed: ENDOSCOPIC PLANTAR FASC. RELEASE (Left Foot)     Patient location during evaluation: PACU Anesthesia Type: General Level of consciousness: awake and alert Pain management: pain level controlled Vital Signs Assessment: post-procedure vital signs reviewed and stable Respiratory status: spontaneous breathing, nonlabored ventilation, respiratory function stable and patient connected to nasal cannula oxygen Cardiovascular status: blood pressure returned to baseline and stable Postop Assessment: no apparent nausea or vomiting Anesthetic complications: no   No complications documented.  Kendan Cornforth A  Tyrianna Lightle

## 2020-10-11 ENCOUNTER — Encounter: Payer: Self-pay | Admitting: Podiatry

## 2020-10-11 NOTE — Telephone Encounter (Signed)
Left Zella Ball a detailed message that I have scheduled patietn for 10/22/2020 at 1:45 pm for pre op PE wiht Dr. Sharmon Revere.

## 2020-10-21 ENCOUNTER — Inpatient Hospital Stay: Admit: 2020-10-21 | Payer: MEDICARE | Primary: Family Medicine

## 2020-10-21 ENCOUNTER — Ambulatory Visit: Admit: 2020-10-21 | Discharge: 2020-10-21 | Payer: MEDICARE | Primary: Family Medicine

## 2020-10-21 DIAGNOSIS — M1712 Unilateral primary osteoarthritis, left knee: Secondary | ICD-10-CM

## 2020-10-21 DIAGNOSIS — Z01818 Encounter for other preprocedural examination: Secondary | ICD-10-CM

## 2020-10-21 LAB — BASIC METABOLIC PANEL
Anion Gap: 3 mmol/L (ref 3.0–18)
BUN: 11 MG/DL (ref 7.0–18)
Bun/Cre Ratio: 18 (ref 12–20)
CO2: 29 mmol/L (ref 21–32)
Calcium: 9 MG/DL (ref 8.5–10.1)
Chloride: 111 mmol/L (ref 100–111)
Creatinine: 0.62 MG/DL (ref 0.6–1.3)
EGFR IF NonAfrican American: >60 mL/min/{1.73_m2} (ref >60)
GFR African American: >60 mL/min/{1.73_m2} (ref >60)
Glucose: 108 mg/dL — ABNORMAL HIGH (ref 74–99)
Potassium: 4 mmol/L (ref 3.5–5.5)
Sodium: 143 mmol/L (ref 136–145)

## 2020-10-21 LAB — URINALYSIS W/ RFLX MICROSCOPIC
Bilirubin, Urine: NEGATIVE
Bilirubin: NEGATIVE
Blood, Urine: NEGATIVE
Blood: NEGATIVE
Glucose, Ur: NEGATIVE mg/dL
Glucose: NEGATIVE mg/dL
Ketone: NEGATIVE mg/dL
Ketones, Urine: NEGATIVE mg/dL
Leukocyte Esterase, Urine: NEGATIVE
Leukocyte Esterase: NEGATIVE
Nitrite, Urine: NEGATIVE
Nitrites: NEGATIVE
Protein, UA: NEGATIVE mg/dL
Protein: NEGATIVE mg/dL
Specific Gravity, UA: 1.02 (ref 1.005–1.030)
Specific gravity: 1.02 (ref 1.005–1.030)
Urobilinogen, UA, POCT: 1 EU/dL (ref 0.2–1.0)
Urobilinogen: 1 U/dL (ref 0.2–1.0)
pH (UA): 5.5 (ref 5.0–8.0)
pH, UA: 5.5 (ref 5.0–8.0)

## 2020-10-21 LAB — CBC WITH AUTO DIFFERENTIAL
Basophils %: 1 % (ref 0–2)
Basophils Absolute: 0 10*3/uL (ref 0.0–0.1)
Eosinophils %: 2 % (ref 0–5)
Eosinophils Absolute: 0.1 10*3/uL (ref 0.0–0.4)
Granulocyte Absolute Count: 0 10*3/uL (ref 0.00–0.04)
Hematocrit: 41.3 % (ref 35.0–45.0)
Hemoglobin: 12.9 g/dL (ref 12.0–16.0)
Immature Granulocytes: 0 % (ref 0.0–0.5)
Lymphocytes %: 26 % (ref 21–52)
Lymphocytes Absolute: 1.9 10*3/uL (ref 0.9–3.6)
MCH: 27.9 PG (ref 24.0–34.0)
MCHC: 31.2 g/dL (ref 31.0–37.0)
MCV: 89.4 FL (ref 78.0–100.0)
MPV: 12.9 FL — ABNORMAL HIGH (ref 9.2–11.8)
Monocytes %: 5 % (ref 3–10)
Monocytes Absolute: 0.4 10*3/uL (ref 0.05–1.2)
NRBC Absolute: 0 10*3/uL (ref 0.00–0.01)
Neutrophils %: 66 % (ref 40–73)
Neutrophils Absolute: 4.9 10*3/uL (ref 1.8–8.0)
Nucleated RBCs: 0 PER 100 WBC
Platelets: 126 10*3/uL — ABNORMAL LOW (ref 135–420)
RBC: 4.62 M/uL (ref 4.20–5.30)
RDW: 13.6 % (ref 11.6–14.5)
WBC: 7.4 10*3/uL (ref 4.6–13.2)

## 2020-10-21 LAB — APTT: aPTT: 26.5 s (ref 23.0–36.4)

## 2020-10-21 LAB — EKG 12-LEAD
Atrial Rate: 68 {beats}/min
Diagnosis: NORMAL
P Axis: 48 degrees
P-R Interval: 142 ms
Q-T Interval: 406 ms
QRS Duration: 76 ms
QTc Calculation (Bazett): 431 ms
R Axis: 11 degrees
T Axis: 94 degrees
Ventricular Rate: 68 {beats}/min

## 2020-10-21 LAB — HEMOGLOBIN A1C W/EAG
Hemoglobin A1C: 5.9 % — ABNORMAL HIGH (ref 4.2–5.6)
eAG: 123 mg/dL

## 2020-10-21 LAB — PROTIME-INR
INR: 1 (ref 0.8–1.2)
Protime: 14 s (ref 11.5–15.2)

## 2020-10-21 LAB — TYPE AND SCREEN
ABO/Rh: AB NEG
Antibody Screen: NEGATIVE

## 2020-10-21 LAB — ALBUMIN
Albumin: 3.7 g/dL (ref 3.4–5.0)
Albumin: 3.7 g/dL (ref 3.4–5.0)

## 2020-10-21 LAB — CBC WITH AUTOMATED DIFF
ABS. BASOPHILS: 0 10*3/uL (ref 0.0–0.1)
ABS. EOSINOPHILS: 0.1 10*3/uL (ref 0.0–0.4)
ABS. IMM. GRANS.: 0 10*3/uL (ref 0.00–0.04)
ABS. LYMPHOCYTES: 1.9 10*3/uL (ref 0.9–3.6)
ABS. MONOCYTES: 0.4 10*3/uL (ref 0.05–1.2)
ABS. NEUTROPHILS: 4.9 10*3/uL (ref 1.8–8.0)
ABSOLUTE NRBC: 0 10*3/uL (ref 0.00–0.01)
BASOPHILS: 1 % (ref 0–2)
EOSINOPHILS: 2 % (ref 0–5)
HCT: 41.3 % (ref 35.0–45.0)
HGB: 12.9 g/dL (ref 12.0–16.0)
IMMATURE GRANULOCYTES: 0 % (ref 0.0–0.5)
LYMPHOCYTES: 26 % (ref 21–52)
MCH: 27.9 pg (ref 24.0–34.0)
MCHC: 31.2 g/dL (ref 31.0–37.0)
MCV: 89.4 fL (ref 78.0–100.0)
MONOCYTES: 5 % (ref 3–10)
MPV: 12.9 FL — ABNORMAL HIGH (ref 9.2–11.8)
NEUTROPHILS: 66 % (ref 40–73)
NRBC: 0 /100{WBCs}
PLATELET: 126 10*3/uL — ABNORMAL LOW (ref 135–420)
RBC: 4.62 M/uL (ref 4.20–5.30)
RDW: 13.6 % (ref 11.6–14.5)
WBC: 7.4 10*3/uL (ref 4.6–13.2)

## 2020-10-21 LAB — METABOLIC PANEL, BASIC
Anion gap: 3 mmol/L (ref 3.0–18)
BUN/Creatinine ratio: 18 (ref 12–20)
BUN: 11 mg/dL (ref 7.0–18)
CO2: 29 mmol/L (ref 21–32)
Calcium: 9 mg/dL (ref 8.5–10.1)
Chloride: 111 mmol/L (ref 100–111)
Creatinine: 0.62 mg/dL (ref 0.6–1.3)
GFR est AA: >60 mL/min/{1.73_m2} (ref >60)
GFR est non-AA: >60 mL/min/{1.73_m2} (ref >60)
Glucose: 108 mg/dL — ABNORMAL HIGH (ref 74–99)
Potassium: 4 mmol/L (ref 3.5–5.5)
Sodium: 143 mmol/L (ref 136–145)

## 2020-10-21 LAB — EKG, 12 LEAD, INITIAL
Atrial Rate: 68 {beats}/min
Calculated P Axis: 48 degrees
Calculated R Axis: 11 degrees
Calculated T Axis: 94 degrees
Diagnosis: NORMAL
P-R Interval: 142 ms
Q-T Interval: 406 ms
QRS Duration: 76 ms
QTC Calculation (Bezet): 431 ms
Ventricular Rate: 68 {beats}/min

## 2020-10-21 LAB — PTT: aPTT: 26.5 s (ref 23.0–36.4)

## 2020-10-21 LAB — HEMOGLOBIN A1C WITH EAG
Est. average glucose: 123 mg/dL
Hemoglobin A1c: 5.9 % — ABNORMAL HIGH (ref 4.2–5.6)

## 2020-10-21 LAB — PROTHROMBIN TIME + INR
INR: 1 (ref 0.8–1.2)
Prothrombin time: 14 s (ref 11.5–15.2)

## 2020-10-21 LAB — TYPE & SCREEN
ABO/Rh(D): AB NEG
Antibody screen: NEGATIVE

## 2020-10-22 ENCOUNTER — Ambulatory Visit: Attending: Family Medicine | Primary: Family Medicine

## 2020-10-22 ENCOUNTER — Ambulatory Visit: Admit: 2020-10-22 | Discharge: 2020-10-22 | Payer: MEDICARE | Attending: Family Medicine | Primary: Family Medicine

## 2020-10-22 DIAGNOSIS — Z01818 Encounter for other preprocedural examination: Secondary | ICD-10-CM

## 2020-10-22 LAB — CULTURE, URINE
Culture result:: NO GROWTH
Culture: NO GROWTH

## 2020-10-22 MED ORDER — SPIRONOLACTONE 25 MG TAB
25 mg | ORAL_TABLET | Freq: Every day | ORAL | 1 refills | Status: AC
Start: 2020-10-22 — End: ?

## 2020-10-22 NOTE — Progress Notes (Signed)
 1. Have you been to the ER, urgent care clinic since your last visit?  Hospitalized since your last visit? No    2. Have you seen or consulted any other health care providers outside of the [facility] since your last visit?  Include any pap smears or colon screening. No    Chief Complaint   Patient presents with   . Pre-op Exam     LTK 10/28/2020 Dr. Coletta   . Diabetes   . Vitamin D  Deficiency

## 2020-10-22 NOTE — Progress Notes (Signed)
Preoperative Evaluation    Date of Exam: 10/22/2020    Cheryl Paul is a 70 y.o. female (DOB:July 27, 1950) who presents for preoperative evaluation for Left Total Knee arthroplasty 10/28/2020    She denies any cardiorespiratory symptoms with exertion, including home chores. 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    Anesthesia Complications: None  History of abnormal bleeding : None    Objective:     ROS:   Feeling well. No dyspnea or chest pain on exertion.  No abdominal pain, change in bowel habits, black or bloody stools.  No urinary tract symptoms. No neurological complaints.    OBJECTIVE:   The patient appears well, alert, oriented x 3, in no distress.  Visit Vitals  BP 138/80 (BP 1 Location: Left upper arm, BP Patient Position: Sitting, BP Cuff Size: Adult)   Pulse 71   Temp 97.4 ??F (36.3 ??C) (Temporal)   Resp 16   Ht 5\' 3"  (1.6 m)   Wt 199 lb 12.8 oz (90.6 kg)   SpO2 97%   BMI 35.39 kg/m??     HEENT:ENT normal.  Neck supple. No adenopathy or thyromegaly. PERLA.   Chest: Lungs are clear, good air entry, no wheezes, rhonchi or rales.   Cardiovascular: S1 and S2 normal, no murmurs, regular rate and rhythm.   Abdomen: soft without tenderness, guarding, mass or organomegaly.   Extremities: show no edema, normal peripheral pulses.   Neurological: is normal, no focal findings.      DIAGNOSTICS:   1. EKG: EKG FINDINGS - normal sinus rhythm, nonspecific ST and T waves changes. Similar to previous    Results for orders placed or performed during the hospital encounter of 10/21/20   CULTURE, URINE    Specimen: Urine   Result Value Ref Range    Special Requests: NO SPECIAL REQUESTS      Culture result: No growth (<1,000 CFU/ML)     CBC WITH AUTOMATED DIFF   Result Value Ref Range    WBC 7.4 4.6 - 13.2 K/uL    RBC 4.62 4.20 - 5.30 M/uL    HGB 12.9 12.0 - 16.0 g/dL    HCT 12/21/20 64.4 - 03.4 %    MCV 89.4 78.0 - 100.0 FL    MCH 27.9 24.0 - 34.0  PG    MCHC 31.2 31.0 - 37.0 g/dL    RDW 74.2 59.5 - 63.8 %    PLATELET 126 (L) 135 - 420 K/uL    MPV 12.9 (H) 9.2 - 11.8 FL    NRBC 0.0 0 PER 100 WBC    ABSOLUTE NRBC 0.00 0.00 - 0.01 K/uL    NEUTROPHILS 66 40 - 73 %    LYMPHOCYTES 26 21 - 52 %    MONOCYTES 5 3 - 10 %    EOSINOPHILS 2 0 - 5 %    BASOPHILS 1 0 - 2 %    IMMATURE GRANULOCYTES 0 0.0 - 0.5 %    ABS. NEUTROPHILS 4.9 1.8 - 8.0 K/UL    ABS. LYMPHOCYTES 1.9 0.9 - 3.6 K/UL    ABS. MONOCYTES 0.4 0.05 - 1.2 K/UL    ABS. EOSINOPHILS 0.1 0.0 - 0.4 K/UL    ABS. BASOPHILS 0.0 0.0 - 0.1 K/UL    ABS. IMM. GRANS. 0.0 0.00 - 0.04 K/UL    DF AUTOMATED     PROTHROMBIN TIME + INR   Result Value Ref  Range    Prothrombin time 14.0 11.5 - 15.2 sec    INR 1.0 0.8 - 1.2     PTT   Result Value Ref Range    aPTT 26.5 23.0 - 36.4 SEC   METABOLIC PANEL, BASIC   Result Value Ref Range    Sodium 143 136 - 145 mmol/L    Potassium 4.0 3.5 - 5.5 mmol/L    Chloride 111 100 - 111 mmol/L    CO2 29 21 - 32 mmol/L    Anion gap 3 3.0 - 18 mmol/L    Glucose 108 (H) 74 - 99 mg/dL    BUN 11 7.0 - 18 MG/DL    Creatinine 4.13 0.6 - 1.3 MG/DL    BUN/Creatinine ratio 18 12 - 20      GFR est AA >60 >60 ml/min/1.67m2    GFR est non-AA >60 >60 ml/min/1.31m2    Calcium 9.0 8.5 - 10.1 MG/DL   ALBUMIN   Result Value Ref Range    Albumin 3.7 3.4 - 5.0 g/dL   HEMOGLOBIN K4M WITH EAG   Result Value Ref Range    Hemoglobin A1c 5.9 (H) 4.2 - 5.6 %    Est. average glucose 123 mg/dL   URINALYSIS W/ RFLX MICROSCOPIC   Result Value Ref Range    Color YELLOW      Appearance CLEAR      Specific gravity 1.020 1.005 - 1.030      pH (UA) 5.5 5.0 - 8.0      Protein Negative NEG mg/dL    Glucose Negative NEG mg/dL    Ketone Negative NEG mg/dL    Bilirubin Negative NEG      Blood Negative NEG      Urobilinogen 1.0 0.2 - 1.0 EU/dL    Nitrites Negative NEG      Leukocyte Esterase Negative NEG     EKG, 12 LEAD, INITIAL   Result Value Ref Range    Ventricular Rate 68 BPM    Atrial Rate 68 BPM    P-R Interval 142 ms    QRS  Duration 76 ms    Q-T Interval 406 ms    QTC Calculation (Bezet) 431 ms    Calculated P Axis 48 degrees    Calculated R Axis 11 degrees    Calculated T Axis 94 degrees    Diagnosis       Normal sinus rhythm with sinus arrhythmia  Nonspecific T wave abnormality  Abnormal ECG  When compared with ECG of 15-May-2018 02:19,  Nonspecific T wave abnormality, improved in Inferior leads  Confirmed by Doristine Johns, MD, [place] [phone]) on 10/21/2020 10:40:35 AM     TYPE & SCREEN   Result Value Ref Range    Crossmatch Expiration 11/04/2020,2359     ABO/Rh(D) AB NEGATIVE     Antibody screen NEG        IMPRESSION:   Diagnoses and all orders for this visit:    1. Pre-op evaluation  Diabetes mellitus, noted chronic mild asymptomatic thrombocytopenia  Low risk for planned surgery  No contraindications to planned surgery  2. Thrombocytopenia, unspecified (HCC)    3. Type 2 diabetes mellitus without complication, with long-term current use of insulin (HCC)    4. Severe obesity (BMI 35.0-39.9) with comorbidity (HCC)    5. Essential hypertension    Other orders  -     spironolactone (ALDACTONE) 25 mg tablet; Take 1 Tablet by mouth daily.        Montgomery Favor P  Sharmon Revere, MD   10/22/2020

## 2020-10-23 ENCOUNTER — Ambulatory Visit: Attending: Physician Assistant | Primary: Family Medicine

## 2020-10-23 ENCOUNTER — Ambulatory Visit: Admit: 2020-10-23 | Discharge: 2020-10-23 | Payer: MEDICARE | Attending: Physician Assistant | Primary: Family Medicine

## 2020-10-23 DIAGNOSIS — M1712 Unilateral primary osteoarthritis, left knee: Secondary | ICD-10-CM

## 2020-10-23 NOTE — Progress Notes (Signed)
See H&P note    Clearance obtained from PCP

## 2020-10-23 NOTE — H&P (Signed)
H&P by Sharen Hones, PA-C at 10/23/20 0900                Author: Sharen Hones, PA-C  Service: --  Author Type: Physician Assistant       Filed: 10/23/20 0920  Encounter Date: 10/23/2020  Status: Signed          Editor: Everlena Cooper (Physician Assistant)                          [facility] [address]   [address]   [place] [address]   [phone]                   HISTORY & PHYSICAL         Patient: Cheryl Paul                 MRN: [id number]       SSN:  [id number]   Date of Birth: 1950-12-03         AGE: 70 y.o.        SEX:  female   Body mass index is 35.25 kg/m??.      PCP: Mable Fill, MD   10/23/20         CC: left knee end stage OA     Problem List Items Addressed This Visit           None              Visit Diagnoses           Primary osteoarthritis of left knee    -  Primary          Pre-operative exam                         HPI:  The patient is a pleasant 70 y.o. whom has end stage OA of their Left  knee and has failed conservative treatment including but not limited to NSAIDS, cortisone injections, viscosupplementation, PT, and pain medicine.  Due to the current findings and affected activities of daily living, surgical intervention is indicated.   The alternatives, risks, complications, as well as expected outcome were discussed.  These include but are not limited to infection, blood loss, need for blood transfusion, neurovascular damage, DVT, PE,  post-op stiffness and pain, leg length discrepancy,  dislocation, anesthetic complications, prothesis longevity, need for more surgery, MI, stroke, and even death.  The patient understands and wishes to proceed with surgery.          Past Medical History:        Diagnosis  Date         ?  Arthritis            knee, arms,          ?  Asthma       ?  Chronic obstructive pulmonary disease (HCC)       ?  Chronic pain           ?  Diabetes (HCC)           ?  H/O sinusitis       ?   Hypercholesterolemia       ?  Hypertension       ?  Sleep apnea            NOT  USING          ?  Stress incontinence       ?  Toe fracture, left  August 2015          4th toe              Current Outpatient Medications:    ?  spironolactone (ALDACTONE) 25 mg tablet, Take 1 Tablet by mouth daily., Disp: 90 Tablet, Rfl: 1   ?  potassium chloride (K-DUR, KLOR-CON M20) 20 mEq tablet, Take 2 Tablets by mouth three (3) times daily., Disp: 270 Tablet, Rfl: 0   ?  nystatin (MYCOSTATIN) topical cream, Apply  to affected area two (2) times a day., Disp: 15 g, Rfl: 1   ?  ergocalciferol (ERGOCALCIFEROL) 1,250 mcg (50,000 unit) capsule, Take 1 Capsule by mouth every seven (7) days., Disp: 12 Capsule, Rfl: 3   ?  diclofenac (VOLTAREN) 1 % gel, Apply 4 g to affected area four (4) times daily as needed for Pain., Disp: 1 Each, Rfl: 3   ?  magnesium oxide (MAG-OX) 400 mg tablet, Take 1 Tablet by mouth daily., Disp: 90 Tablet, Rfl: 0   ?  calcium citrate 200 mg (950 mg) tablet, Take 1 Tab by mouth daily. Indications: malabsorption, Disp: 30 Tab, Rfl: 0   ?  multivitamin (ONE A DAY) tablet, Take 1 Tab by mouth daily., Disp: , Rfl:    ?  thiamine HCL (B-1) 100 mg tablet, Take  by mouth daily., Disp: , Rfl:    ?  cyanocobalamin 1,000 mcg tablet, Take 1,000 mcg by mouth daily., Disp: , Rfl:    ?  cholecalciferol, VITAMIN D3, (VITAMIN D3) 5,000 unit tab tablet, Take 5,000 Units by mouth daily. (Patient not taking: Reported on 10/22/2020), Disp: , Rfl:    ?  albuterol (PROVENTIL HFA, VENTOLIN HFA, PROAIR HFA) 90 mcg/actuation inhaler, Take 1-2 Puffs by inhalation every four (4) hours as needed for Wheezing. (Patient not taking: Reported on 03/04/2020), Disp: 1 Inhaler, Rfl: 0   ?  alcohol swabs padm, Use daily to test blood sugar  DX E11.9 (Patient not taking: Reported on 08/06/2020), Disp: 100 Pad, Rfl: 1        Allergies        Allergen  Reactions         ?  Latex  Rash     ?  Penicillins  Itching     ?  Acetaminophen-Codeine  Nausea Only  and Other (comments)             Nausea and pains in stomach         ?  Butrans [Buprenorphine]  Rash     ?  Codeine  Other (comments)             Nausea and abd pain         ?  Iodine  Rash     ?  Lactose  Diarrhea             Lactose intolerance         ?  Other Plant, Educational psychologistAnimal, Environmental  Hives             Conductive electrodes             Social History          Socioeconomic History         ?  Marital status:  SINGLE  Spouse name:  Not on file         ?  Number of children:  Not on file     ?  Years of education:  Not on file     ?  Highest education level:  Not on file       Occupational History        ?  Not on file       Tobacco Use         ?  Smoking status:  Never Smoker     ?  Smokeless tobacco:  Never Used       Substance and Sexual Activity         ?  Alcohol use:  No              Alcohol/week:  0.0 standard drinks         ?  Drug use:  No     ?  Sexual activity:  Not Currently        Other Topics  Concern        ?  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 Denies history of smoking           Social Determinants of Health          Financial Resource Strain:         ?  Difficulty of Paying Living Expenses: Not on file       Food Insecurity:         ?  Worried About Running Out of Food in the Last Year: Not on file     ?  Ran Out of Food in the Last Year: Not on file       Transportation Needs:         ?  Lack of Transportation (Medical): Not on file     ?  Lack of Transportation (Non-Medical): Not on file       Physical Activity:         ?  Days of Exercise per Week: Not on file     ?  Minutes of Exercise per Session: Not on file       Stress:         ?  Feeling of Stress : Not on file       Social Connections:         ?  Frequency of Communication with Friends and Family: Not on file     ?  Frequency of Social Gatherings with Friends and Family: Not on file     ?  Attends Religious Services: Not on file     ?   Active Member of Clubs or Organizations: Not on file     ?  Attends Banker Meetings: Not on file     ?  Marital Status: Not on file       Intimate Partner Violence:         ?  Fear of Current or Ex-Partner: Not on file     ?  Emotionally Abused: Not on file     ?  Physically Abused: Not on file     ?  Sexually Abused: Not on file       Housing Stability:         ?  Unable to Pay for Housing in  the Last Year: Not on file     ?  Number of Places Lived in the Last Year: Not on file        ?  Unstable Housing in the Last Year: Not on file             Past Surgical History:         Procedure  Laterality  Date          ?  HX CARPAL TUNNEL RELEASE  Bilateral       ?  HX CHOLECYSTECTOMY         ?  HX GYN         ?  HX HERNIA REPAIR              abdomen          ?  HX HYSTERECTOMY              tubes tied          ?  HX LAP GASTRIC BYPASS    02/21/2018     ?  HX ORTHOPAEDIC              catrpal tunnel right and left          ?  HX POLYPECTOMY    04/18/14     ?  PR ABDOMEN SURGERY PROC UNLISTED              4 surguries for blockages           Family History:  Non-contributory.       PE:   Visit Vitals      BP  135/80 (BP 1 Location: Left arm, BP Patient Position: Sitting, BP Cuff Size: Adult)     Temp  97.8 ??F (36.6 ??C) (Temporal)     Ht  5\' 3"  (1.6 m)     Wt  199 lb (90.3 kg)        BMI  35.25 kg/m??        A&O X3, NAD, well develop, well nourished   Heart: S1-S2, rrr   Lungs: CTA bilat   Abd: soft, nt, nt, + bs in all quadrants   Ext:  Pos distal pulses to DP, PT         X-ray: Radiographs of the left knee done 10/21/[address] [facility] including AP, lateral, skyline, 3 foot standing views confirms end-stage arthritis of bone-on-bone eburnation and malalignment.      Labs: labs were reviewed and wnl.  ua neg      A:  Left  knee end stage OA      P:  At this point we will move forward with surgery.  Again, the alternatives, risks, complications, as well as expected outcome were discussed and the patient  wishes to proceed with surgery.  Pt has been instructed to stop aspirin, nsaids, rheumatologic  medications and blood thinners.  They have also been instructed to continue on any heart and bp meds and to take them the morning of surgery with sips of water.       The patient was counseled at length about the risks of contracting Covid-19 during their perioperative period and any recovery window from their procedure.  The patient was made aware that  contracting Covid-19  may worsen their prognosis for recovering from their procedure and lend to a higher morbidity and/or mortality risk.  All material risks, benefits, and reasonable alternatives including postponing the procedure were  discussed. The  patient does  wish to proceed with the procedure at this time.                  Augustin Schooling PA-C   Dr. Meyer Cory

## 2020-10-24 NOTE — Interval H&P Note (Signed)
PRE-SURGICAL INSTRUCTIONS        Patient's Name:  GAYLYNN SEIPLE      Today's Date:  10/24/2020            Covid Testing Date and Time: vaccinated    Surgery Date:  10/28/2020                1. Do NOT eat or drink anything, including candy, gum, or ice chips after midnight on 10/28/20, unless you have specific instructions from your surgeon or anesthesia provider to do so.  2. You may brush your teeth before coming to the hospital.  3. No smoking 24 hours prior to the day of surgery.  4. No alcohol 24 hours prior to the day of surgery.  5. No recreational drugs for one week prior to the day of surgery.  6. Leave all valuables, including money/purse, at home.  7. Remove all jewelry, nail polish, acrylic nails, and makeup (including mascara); no lotions powders, deodorant, or perfume/cologne/after shave on the skin.  8. Follow instruction for Hibiclens washes and CHG wipes from surgeon's office.   9. Glasses/contact lenses and dentures may be worn to the hospital.  They will be removed prior to surgery.  10. Call your doctor if symptoms of a cold or illness develop within 24-48 hours prior to your surgery.  11.  If you are having an outpatient procedure, please make arrangements for a responsible ADULT TO DRIVE YOU HOME AFTER SURGERY and stay with you for 24 hours after your surgery.  12.  ONE VISITOR in the hospital at this time for outpatient procedures.  Exceptions may be made for surgical admissions, per nursing unit guidelines      Special Instructions:      Bring list of CURRENT medications.  Bring any pertinent legal medical records.  Take these medications the morning of surgery with a sip of water:  As directed by MD   Follow physician instructions about stopping anticoagulants.        On the day of surgery, come in the main entrance of [facility].  Let the security guard at the desk know you are there for surgery.  A staff member will come escort you to the surgical area on the second floor.    If  you have any questions or concerns, please do not hesitate to call:     (Prior to the day of surgery) PAT department:  [phone]   (Day of surgery) Pre-Op department:  [phone]    These surgical instructions were reviewed with Wyman Songster during the PAT phone call.

## 2020-10-28 ENCOUNTER — Observation Stay: Admit: 2020-10-28 | Payer: MEDICARE | Primary: Family Medicine

## 2020-10-28 ENCOUNTER — Observation Stay: Payer: MEDICARE | Primary: Family Medicine

## 2020-10-28 ENCOUNTER — Inpatient Hospital Stay: Payer: MEDICARE

## 2020-10-28 LAB — POCT GLUCOSE
POC Glucose: 107 mg/dL (ref 70–110)
POC Glucose: 125 mg/dL — ABNORMAL HIGH (ref 70–110)
POC Glucose: 189 mg/dL — ABNORMAL HIGH (ref 70–110)

## 2020-10-28 LAB — GLUCOSE, POC
Glucose (POC): 107 mg/dL (ref 70–110)
Glucose (POC): 125 mg/dL — ABNORMAL HIGH (ref 70–110)
Glucose (POC): 189 mg/dL — ABNORMAL HIGH (ref 70–110)

## 2020-10-28 MED ORDER — SODIUM CHLORIDE 0.9 % IV
INTRAVENOUS | Status: AC
Start: 2020-10-28 — End: ?

## 2020-10-28 MED ORDER — DIPHENHYDRAMINE HCL 50 MG/ML IJ SOLN
50 mg/mL | INTRAMUSCULAR | Status: DC | PRN
Start: 2020-10-28 — End: 2020-10-28

## 2020-10-28 MED ORDER — GLUCOSE 4 GRAM CHEWABLE TAB
4 gram | ORAL | Status: DC | PRN
Start: 2020-10-28 — End: 2020-10-28

## 2020-10-28 MED ORDER — SODIUM CHLORIDE 0.9 % IV
INTRAVENOUS | Status: DC | PRN
Start: 2020-10-28 — End: 2020-10-28
  Administered 2020-10-28: 15:00:00

## 2020-10-28 MED ORDER — FENTANYL CITRATE (PF) 50 MCG/ML IJ SOLN
50 mcg/mL | INTRAMUSCULAR | Status: AC
Start: 2020-10-28 — End: ?

## 2020-10-28 MED ORDER — SODIUM CHLORIDE 0.9 % IJ SYRG
Freq: Three times a day (TID) | INTRAMUSCULAR | Status: DC
Start: 2020-10-28 — End: 2020-10-28
  Administered 2020-10-28: 18:00:00 via INTRAVENOUS

## 2020-10-28 MED ORDER — ASPIRIN 81 MG TAB, DELAYED RELEASE
81 mg | ORAL_TABLET | Freq: Two times a day (BID) | ORAL | 1 refills | Status: AC
Start: 2020-10-28 — End: ?

## 2020-10-28 MED ORDER — CELECOXIB 400 MG CAP
400 mg | Freq: Once | ORAL | Status: AC
Start: 2020-10-28 — End: 2020-10-28
  Administered 2020-10-28: 14:00:00 via ORAL

## 2020-10-28 MED ORDER — LIDOCAINE (PF) 10 MG/ML (1 %) IJ SOLN
10 mg/mL (1 %) | Freq: Once | INTRAMUSCULAR | Status: AC
Start: 2020-10-28 — End: 2020-10-28
  Administered 2020-10-28: 14:00:00 via INTRADERMAL

## 2020-10-28 MED ORDER — SODIUM CHLORIDE 0.9 % IJ SYRG
INTRAMUSCULAR | Status: DC | PRN
Start: 2020-10-28 — End: 2020-10-28

## 2020-10-28 MED ORDER — PREGABALIN 75 MG CAP
75 mg | Freq: Once | ORAL | Status: AC
Start: 2020-10-28 — End: 2020-10-28
  Administered 2020-10-28: 14:00:00 via ORAL

## 2020-10-28 MED ORDER — INSULIN LISPRO 100 UNIT/ML INJECTION
100 unit/mL | Freq: Once | SUBCUTANEOUS | Status: DC
Start: 2020-10-28 — End: 2020-10-28
  Administered 2020-10-28: 18:00:00 via SUBCUTANEOUS

## 2020-10-28 MED ORDER — ACETAMINOPHEN 500 MG TAB
500 mg | Freq: Once | ORAL | Status: AC
Start: 2020-10-28 — End: 2020-10-28
  Administered 2020-10-28: 14:00:00 via ORAL

## 2020-10-28 MED ORDER — ROCURONIUM 10 MG/ML IV
10 mg/mL | INTRAVENOUS | Status: DC | PRN
Start: 2020-10-28 — End: 2020-10-28
  Administered 2020-10-28: 14:00:00 via INTRAVENOUS

## 2020-10-28 MED ORDER — GLUCAGON 1 MG INJECTION
1 mg | INTRAMUSCULAR | Status: DC | PRN
Start: 2020-10-28 — End: 2020-10-28

## 2020-10-28 MED ORDER — SODIUM CHLORIDE 0.9 % IV
INTRAVENOUS | Status: DC
Start: 2020-10-28 — End: 2020-10-29
  Administered 2020-10-29: 13:00:00 via INTRAVENOUS

## 2020-10-28 MED ORDER — POTASSIUM CHLORIDE SR 20 MEQ TAB, PARTICLES/CRYSTALS
20 mEq | Freq: Two times a day (BID) | ORAL | Status: DC
Start: 2020-10-28 — End: 2020-10-29
  Administered 2020-10-28 – 2020-10-29 (×2): via ORAL

## 2020-10-28 MED ORDER — MIDAZOLAM 1 MG/ML IJ SOLN
1 mg/mL | INTRAMUSCULAR | Status: AC
Start: 2020-10-28 — End: 2020-10-28
  Administered 2020-10-28: 14:00:00 via INTRAVENOUS

## 2020-10-28 MED ORDER — CELECOXIB 200 MG CAP
200 mg | ORAL_CAPSULE | Freq: Two times a day (BID) | ORAL | 2 refills | Status: DC
Start: 2020-10-28 — End: 2021-01-22

## 2020-10-28 MED ORDER — SENNOSIDES-DOCUSATE SODIUM 8.6 MG-50 MG TAB
Freq: Two times a day (BID) | ORAL | Status: DC
Start: 2020-10-28 — End: 2020-10-29
  Administered 2020-10-29: 13:00:00 via ORAL

## 2020-10-28 MED ORDER — LABETALOL 5 MG/ML IV SYRINGE
20 mg/4 mL (5 mg/mL) | INTRAVENOUS | Status: DC | PRN
Start: 2020-10-28 — End: 2020-10-28
  Administered 2020-10-28: 16:00:00 via INTRAVENOUS

## 2020-10-28 MED ORDER — LIDOCAINE (PF) 20 MG/ML (2 %) IJ SOLN
20 mg/mL (2 %) | INTRAMUSCULAR | Status: DC | PRN
Start: 2020-10-28 — End: 2020-10-28
  Administered 2020-10-28: 14:00:00 via INTRAVENOUS

## 2020-10-28 MED ORDER — WATER FOR INJECTION, STERILE INJECTION
1 gram | Freq: Three times a day (TID) | INTRAMUSCULAR | Status: DC
Start: 2020-10-28 — End: 2020-10-28

## 2020-10-28 MED ORDER — BUPIVACAINE LIPOSOME (PF) 266 MG/20 ML (13.3 MG/ML) SUSP, INFILTRATION
1.3 % (13.3 mg/mL) | Status: AC
Start: 2020-10-28 — End: ?

## 2020-10-28 MED ORDER — BUPIVACAINE (PF) 0.5 % (5 MG/ML) IJ SOLN
0.5 % (5 mg/mL) | INTRAMUSCULAR | Status: AC
Start: 2020-10-28 — End: ?

## 2020-10-28 MED ORDER — EPINEPHRINE (PF) 1 MG/ML INJECTION
1 mg/mL ( mL) | INTRAMUSCULAR | Status: AC
Start: 2020-10-28 — End: ?

## 2020-10-28 MED ORDER — SODIUM CHLORIDE 0.9 % IJ SYRG
Freq: Three times a day (TID) | INTRAMUSCULAR | Status: DC
Start: 2020-10-28 — End: 2020-10-29
  Administered 2020-10-28 – 2020-10-29 (×2): via INTRAVENOUS

## 2020-10-28 MED ORDER — MIDAZOLAM 1 MG/ML IJ SOLN
1 mg/mL | Freq: Once | INTRAMUSCULAR | Status: AC
Start: 2020-10-28 — End: 2020-10-28
  Administered 2020-10-28: 14:00:00 via INTRAVENOUS

## 2020-10-28 MED ORDER — ONDANSETRON (PF) 4 MG/2 ML INJECTION
4 mg/2 mL | INTRAMUSCULAR | Status: DC | PRN
Start: 2020-10-28 — End: 2020-10-28
  Administered 2020-10-28: 16:00:00 via INTRAVENOUS

## 2020-10-28 MED ORDER — LACTATED RINGERS IV
INTRAVENOUS | Status: DC | PRN
Start: 2020-10-28 — End: 2020-10-28
  Administered 2020-10-28 (×2): via INTRAVENOUS

## 2020-10-28 MED ORDER — KETOROLAC TROMETHAMINE 15 MG/ML INJECTION
15 mg/mL | INTRAMUSCULAR | Status: AC
Start: 2020-10-28 — End: ?

## 2020-10-28 MED ORDER — ACETAMINOPHEN 500 MG TAB
500 mg | Freq: Four times a day (QID) | ORAL | Status: DC
Start: 2020-10-28 — End: 2020-10-29
  Administered 2020-10-28 – 2020-10-29 (×4): via ORAL

## 2020-10-28 MED ORDER — ASPIRIN 81 MG TAB, DELAYED RELEASE
81 mg | Freq: Two times a day (BID) | ORAL | Status: DC
Start: 2020-10-28 — End: 2020-10-29
  Administered 2020-10-29: 13:00:00 via ORAL

## 2020-10-28 MED ORDER — ONDANSETRON (PF) 4 MG/2 ML INJECTION
4 mg/2 mL | INTRAMUSCULAR | Status: DC | PRN
Start: 2020-10-28 — End: 2020-10-29

## 2020-10-28 MED ORDER — KETOROLAC TROMETHAMINE 15 MG/ML INJECTION
15 mg/mL | Freq: Four times a day (QID) | INTRAMUSCULAR | Status: DC
Start: 2020-10-28 — End: 2020-10-29
  Administered 2020-10-28 – 2020-10-29 (×3): via INTRAVENOUS

## 2020-10-28 MED ORDER — BUPIVACAINE LIPOSOME (PF) 266 MG/20 ML (13.3 MG/ML) SUSP, INFILTRATION
1.3 % (13.3 mg/mL) | Freq: Once | Status: DC
Start: 2020-10-28 — End: 2020-10-28

## 2020-10-28 MED ORDER — WATER FOR INJECTION, STERILE IV
INTRAVENOUS | Status: DC | PRN
Start: 2020-10-28 — End: 2020-10-28
  Administered 2020-10-28: 15:00:00

## 2020-10-28 MED ORDER — SODIUM CHLORIDE 0.9 % IV
1000 mg/10 mL (100 mg/mL) | Freq: Two times a day (BID) | INTRAVENOUS | Status: DC | PRN
Start: 2020-10-28 — End: 2020-10-28

## 2020-10-28 MED ORDER — FENTANYL CITRATE (PF) 50 MCG/ML IJ SOLN
50 mcg/mL | INTRAMUSCULAR | Status: AC
Start: 2020-10-28 — End: 2020-10-28
  Administered 2020-10-28: 14:00:00 via INTRAVENOUS

## 2020-10-28 MED ORDER — OXYCODONE 5 MG TAB
5 mg | ORAL | Status: DC | PRN
Start: 2020-10-28 — End: 2020-10-29
  Administered 2020-10-29: 13:00:00 via ORAL

## 2020-10-28 MED ORDER — METOPROLOL TARTRATE 5 MG/5 ML IV SOLN
5 mg/ mL | INTRAVENOUS | Status: DC | PRN
Start: 2020-10-28 — End: 2020-10-28
  Administered 2020-10-28: 15:00:00 via INTRAVENOUS

## 2020-10-28 MED ORDER — FENTANYL CITRATE (PF) 50 MCG/ML IJ SOLN
50 mcg/mL | INTRAMUSCULAR | Status: DC | PRN
Start: 2020-10-28 — End: 2020-10-28
  Administered 2020-10-28 (×4): via INTRAVENOUS

## 2020-10-28 MED ORDER — SPIRONOLACTONE 25 MG TAB
25 mg | Freq: Every day | ORAL | Status: DC
Start: 2020-10-28 — End: 2020-10-29
  Administered 2020-10-29: 13:00:00 via ORAL

## 2020-10-28 MED ORDER — POLYMYXIN B SULFATE 500,000 UNIT IJ SOLR
500000 unit | INTRAMUSCULAR | Status: AC
Start: 2020-10-28 — End: ?

## 2020-10-28 MED ORDER — DEXAMETHASONE SODIUM PHOSPHATE 4 MG/ML IJ SOLN
4 mg/mL | INTRAMUSCULAR | Status: AC
Start: 2020-10-28 — End: 2020-10-28
  Administered 2020-10-28: 14:00:00 via INTRAVENOUS

## 2020-10-28 MED ORDER — VANCOMYCIN 1,000 MG IV SOLR
1000 mg | INTRAVENOUS | Status: AC
Start: 2020-10-28 — End: ?

## 2020-10-28 MED ORDER — FENTANYL CITRATE (PF) 50 MCG/ML IJ SOLN
50 mcg/mL | INTRAMUSCULAR | Status: DC | PRN
Start: 2020-10-28 — End: 2020-10-28
  Administered 2020-10-28 (×2): via INTRAVENOUS

## 2020-10-28 MED ORDER — SODIUM CHLORIDE 0.9 % IV PIGGY BACK
150 mg/mL | Freq: Once | INTRAVENOUS | Status: AC
Start: 2020-10-28 — End: 2020-10-28
  Administered 2020-10-28: 14:00:00 via INTRAVENOUS

## 2020-10-28 MED ORDER — GLYCOPYRROLATE 0.2 MG/ML IJ SOLN
0.2 mg/mL | INTRAMUSCULAR | Status: DC | PRN
Start: 2020-10-28 — End: 2020-10-28
  Administered 2020-10-28 (×2): via INTRAVENOUS

## 2020-10-28 MED ORDER — ROPIVACAINE 2 MG/ML INJECTION
2 mg/mL (0. %) | INTRAMUSCULAR | Status: AC
Start: 2020-10-28 — End: 2020-10-28
  Administered 2020-10-28: 14:00:00 via PERINEURAL

## 2020-10-28 MED ORDER — CHLORHEXIDINE GLUCONATE 4 % TOPICAL LIQUID
4 % | CUTANEOUS | Status: AC
Start: 2020-10-28 — End: ?

## 2020-10-28 MED ORDER — DOCUSATE SODIUM 100 MG CAP
100 mg | ORAL_CAPSULE | Freq: Two times a day (BID) | ORAL | 2 refills | Status: AC
Start: 2020-10-28 — End: 2021-01-26

## 2020-10-28 MED ORDER — OXYCODONE 5 MG TAB
5 mg | ORAL | Status: DC | PRN
Start: 2020-10-28 — End: 2020-10-29

## 2020-10-28 MED ORDER — SODIUM CHLORIDE 0.9 % IV PIGGY BACK
150 mg/mL | Freq: Three times a day (TID) | INTRAVENOUS | Status: AC
Start: 2020-10-28 — End: 2020-10-29
  Administered 2020-10-28 – 2020-10-29 (×3): via INTRAVENOUS

## 2020-10-28 MED ORDER — POLYMYXIN B SULFATE 500,000 UNIT IJ SOLR
500000 unit | INTRAMUSCULAR | Status: DC | PRN
Start: 2020-10-28 — End: 2020-10-28
  Administered 2020-10-28: 15:00:00

## 2020-10-28 MED ORDER — EPHEDRINE 50 MG/5 ML (10 MG/ML) IN NS IV SYRINGE
50510 mg/5 mL (10 mg/mL) | INTRAVENOUS | Status: DC | PRN
Start: 2020-10-28 — End: 2020-10-28
  Administered 2020-10-28 (×2): via INTRAVENOUS

## 2020-10-28 MED ORDER — PREGABALIN 25 MG CAP
25 mg | Freq: Two times a day (BID) | ORAL | Status: DC
Start: 2020-10-28 — End: 2020-10-29
  Administered 2020-10-28 – 2020-10-29 (×2): via ORAL

## 2020-10-28 MED ORDER — NEOSTIGMINE METHYLSULFATE 3 MG/3 ML (1 MG/ML) IV SYRINGE
3 mg/ mL (1 mg/mL) | INTRAVENOUS | Status: DC | PRN
Start: 2020-10-28 — End: 2020-10-28
  Administered 2020-10-28: 16:00:00 via INTRAVENOUS

## 2020-10-28 MED ORDER — NALOXONE 0.4 MG/ML INJECTION
0.4 mg/mL | INTRAMUSCULAR | Status: DC | PRN
Start: 2020-10-28 — End: 2020-10-29

## 2020-10-28 MED ORDER — TRANEXAMIC ACID 1,000 MG/10 ML (100 MG/ML) IV
1000 mg/10 mL (100 mg/mL) | INTRAVENOUS | Status: AC
Start: 2020-10-28 — End: 2020-10-28
  Administered 2020-10-28 (×2): via INTRAVENOUS

## 2020-10-28 MED ORDER — FENTANYL CITRATE (PF) 50 MCG/ML IJ SOLN
50 mcg/mL | INTRAMUSCULAR | Status: DC | PRN
Start: 2020-10-28 — End: 2020-10-28

## 2020-10-28 MED ORDER — FERROUS SULFATE 325 MG (65 MG ELEMENTAL IRON) TAB
325 mg (65 mg iron) | Freq: Two times a day (BID) | ORAL | Status: DC
Start: 2020-10-28 — End: 2020-10-29
  Administered 2020-10-28 – 2020-10-29 (×2): via ORAL

## 2020-10-28 MED ORDER — ONDANSETRON (PF) 4 MG/2 ML INJECTION
4 mg/2 mL | Freq: Once | INTRAMUSCULAR | Status: DC
Start: 2020-10-28 — End: 2020-10-28
  Administered 2020-10-28: 18:00:00 via INTRAVENOUS

## 2020-10-28 MED ORDER — OXYCODONE-ACETAMINOPHEN 10 MG-325 MG TAB
10-325 mg | ORAL_TABLET | Freq: Four times a day (QID) | ORAL | 0 refills | Status: AC | PRN
Start: 2020-10-28 — End: 2020-11-04

## 2020-10-28 MED ORDER — MAGNESIUM OXIDE 400 MG TAB
400 mg | Freq: Every day | ORAL | Status: DC
Start: 2020-10-28 — End: 2020-10-29
  Administered 2020-10-29: 13:00:00 via ORAL

## 2020-10-28 MED ORDER — LACTATED RINGERS IV
INTRAVENOUS | Status: DC
Start: 2020-10-28 — End: 2020-10-28
  Administered 2020-10-28: 14:00:00 via INTRAVENOUS

## 2020-10-28 MED ORDER — CELECOXIB 100 MG CAP
100 mg | Freq: Two times a day (BID) | ORAL | Status: DC
Start: 2020-10-28 — End: 2020-10-29
  Administered 2020-10-28 – 2020-10-29 (×2): via ORAL

## 2020-10-28 MED ORDER — FLUMAZENIL 0.1 MG/ML IV SOLN
0.1 mg/mL | INTRAVENOUS | Status: DC | PRN
Start: 2020-10-28 — End: 2020-10-29

## 2020-10-28 MED ORDER — SUCCINYLCHOLINE CHLORIDE 20 MG/ML INJECTION
20 mg/mL | INTRAMUSCULAR | Status: DC | PRN
Start: 2020-10-28 — End: 2020-10-28
  Administered 2020-10-28: 14:00:00 via INTRAVENOUS

## 2020-10-28 MED ORDER — FENTANYL CITRATE (PF) 50 MCG/ML IJ SOLN
50 mcg/mL | Freq: Once | INTRAMUSCULAR | Status: AC
Start: 2020-10-28 — End: 2020-10-28
  Administered 2020-10-28: 14:00:00 via INTRAVENOUS

## 2020-10-28 MED ORDER — NALOXONE 0.4 MG/ML INJECTION
0.4 mg/mL | INTRAMUSCULAR | Status: DC | PRN
Start: 2020-10-28 — End: 2020-10-28

## 2020-10-28 MED ORDER — FAMOTIDINE 20 MG TAB
20 mg | Freq: Once | ORAL | Status: AC
Start: 2020-10-28 — End: 2020-10-28
  Administered 2020-10-28: 14:00:00 via ORAL

## 2020-10-28 MED ORDER — CLINDAMYCIN 300 MG CAP
300 mg | ORAL_CAPSULE | Freq: Three times a day (TID) | ORAL | 0 refills | Status: AC
Start: 2020-10-28 — End: 2020-10-31

## 2020-10-28 MED ORDER — SODIUM CHLORIDE 0.9 % IV
0.55 % (5 mg/mL) | INTRAVENOUS | Status: DC | PRN
Start: 2020-10-28 — End: 2020-10-28
  Administered 2020-10-28: 15:00:00 via INTRAMUSCULAR

## 2020-10-28 MED ORDER — PROPOFOL 10 MG/ML IV EMUL
10 mg/mL | INTRAVENOUS | Status: DC | PRN
Start: 2020-10-28 — End: 2020-10-28
  Administered 2020-10-28 (×3): via INTRAVENOUS

## 2020-10-28 MED ORDER — LACTATED RINGERS IV
INTRAVENOUS | Status: DC
Start: 2020-10-28 — End: 2020-10-28
  Administered 2020-10-28: 18:00:00 via INTRAVENOUS

## 2020-10-28 MED ORDER — SODIUM CHLORIDE 0.9 % IJ SYRG
Freq: Three times a day (TID) | INTRAMUSCULAR | Status: DC
Start: 2020-10-28 — End: 2020-10-28

## 2020-10-28 MED ORDER — SODIUM CHLORIDE 0.9 % IJ SYRG
INTRAMUSCULAR | Status: DC | PRN
Start: 2020-10-28 — End: 2020-10-29

## 2020-10-28 MED FILL — KETOROLAC TROMETHAMINE 15 MG/ML INJECTION: 15 mg/mL | INTRAMUSCULAR | Qty: 1

## 2020-10-28 MED FILL — FENTANYL CITRATE (PF) 50 MCG/ML IJ SOLN: 50 mcg/mL | INTRAMUSCULAR | Qty: 5

## 2020-10-28 MED FILL — POLYMYXIN B SULFATE 500,000 UNIT IJ SOLR: 500000 unit | INTRAMUSCULAR | Qty: 500000

## 2020-10-28 MED FILL — CELECOXIB 100 MG CAP: 100 mg | ORAL | Qty: 2

## 2020-10-28 MED FILL — FENTANYL CITRATE (PF) 50 MCG/ML IJ SOLN: 50 mcg/mL | INTRAMUSCULAR | Qty: 2

## 2020-10-28 MED FILL — NAROPIN (PF) 2 MG/ML (0.2 %) INJECTION SOLUTION: 2 mg/mL (0. %) | INTRAMUSCULAR | Qty: 30

## 2020-10-28 MED FILL — ACETAMINOPHEN 500 MG TAB: 500 mg | ORAL | Qty: 2

## 2020-10-28 MED FILL — CLINDAMYCIN PHOSPHATE 150 MG/ML IJ SOLN: 150 mg/mL | INTRAMUSCULAR | Qty: 6

## 2020-10-28 MED FILL — SODIUM CHLORIDE 0.9 % IV: INTRAVENOUS | Qty: 50

## 2020-10-28 MED FILL — LACTATED RINGERS IV: INTRAVENOUS | Qty: 1000

## 2020-10-28 MED FILL — BD POSIFLUSH NORMAL SALINE 0.9 % INJECTION SYRINGE: INTRAMUSCULAR | Qty: 40

## 2020-10-28 MED FILL — CELECOXIB 400 MG CAP: 400 mg | ORAL | Qty: 1

## 2020-10-28 MED FILL — XYLOCAINE-MPF 10 MG/ML (1 %) INJECTION SOLUTION: 10 mg/mL (1 %) | INTRAMUSCULAR | Qty: 4

## 2020-10-28 MED FILL — HIBICLENS 4 % TOPICAL LIQUID: 4 % | CUTANEOUS | Qty: 118

## 2020-10-28 MED FILL — EXPAREL (PF) 1.3 % (13.3 MG/ML) SUSPENSION FOR LOCAL INFILTRATION: 1.3 % (13.3 mg/mL) | Qty: 20

## 2020-10-28 MED FILL — SENNOSIDES-DOCUSATE SODIUM 8.6 MG-50 MG TAB: ORAL | Qty: 1

## 2020-10-28 MED FILL — KLOR-CON M20 MEQ TABLET,EXTENDED RELEASE: 20 mEq | ORAL | Qty: 1

## 2020-10-28 MED FILL — FERROUS SULFATE 325 MG (65 MG ELEMENTAL IRON) TAB: 325 mg (65 mg iron) | ORAL | Qty: 1

## 2020-10-28 MED FILL — PREGABALIN 75 MG CAP: 75 mg | ORAL | Qty: 1

## 2020-10-28 MED FILL — SODIUM CHLORIDE 0.9 % IV: INTRAVENOUS | Qty: 1000

## 2020-10-28 MED FILL — KETOROLAC TROMETHAMINE 15 MG/ML INJECTION: 15 mg/mL | INTRAMUSCULAR | Qty: 2

## 2020-10-28 MED FILL — EPINEPHRINE (PF) 1 MG/ML INJECTION: 1 mg/mL ( mL) | INTRAMUSCULAR | Qty: 1

## 2020-10-28 MED FILL — SODIUM CHLORIDE 0.9 % IV: INTRAVENOUS | Qty: 250

## 2020-10-28 MED FILL — LYRICA 25 MG CAPSULE: 25 mg | ORAL | Qty: 1

## 2020-10-28 MED FILL — MAGNESIUM OXIDE 400 MG TAB: 400 mg | ORAL | Qty: 1

## 2020-10-28 MED FILL — MIDAZOLAM 1 MG/ML IJ SOLN: 1 mg/mL | INTRAMUSCULAR | Qty: 2

## 2020-10-28 MED FILL — TRANEXAMIC ACID 1,000 MG/10 ML (100 MG/ML) IV: 1000 mg/10 mL (100 mg/mL) | INTRAVENOUS | Qty: 10

## 2020-10-28 MED FILL — BUPIVACAINE (PF) 0.5 % (5 MG/ML) IJ SOLN: 0.5 % (5 mg/mL) | INTRAMUSCULAR | Qty: 30

## 2020-10-28 MED FILL — VANCOMYCIN 1,000 MG IV SOLR: 1000 mg | INTRAVENOUS | Qty: 1000

## 2020-10-28 MED FILL — FAMOTIDINE 20 MG TAB: 20 mg | ORAL | Qty: 1

## 2020-10-28 MED FILL — DEXAMETHASONE SODIUM PHOSPHATE 4 MG/ML IJ SOLN: 4 mg/mL | INTRAMUSCULAR | Qty: 2

## 2020-10-28 NOTE — Progress Notes (Signed)
Problem: Mobility Impaired (Adult and Pediatric)  Goal: *Acute Goals and Plan of Care (Insert Text)  Description: Physical Therapy Goals  Initiated 10/28/2020 and to be accomplished within 7 day(s)  1.  Patient will move from supine to sit and sit to supine , scoot up and down, and roll side to side in bed with modified independence.    2.  Patient will transfer from bed to chair and chair to bed with modified independence using the least restrictive device.  3.  Patient will perform sit to stand with modified independence.  4.  Patient will ambulate with supervision/set-up for 150 feet with the least restrictive device.   5.  Patient will ascend/descend 3 stairs with 0 handrail(s) with supervision/set-up.  6.  Patient to perform TKA therex as prescribed to LLE.     PLOF: Pt lives with many family members in a 1 SH ( attached "suite" ) with 3 STE no HR, reports she couldn't find her walker, mod ind PTA      Outcome: Progressing Towards Goal     PHYSICAL THERAPY EVALUATION    Patient: Cheryl Paul (70 y.o. female)  Date: 10/28/2020  Primary Diagnosis: Osteoarthritis of left knee, unspecified osteoarthritis type [M17.12]  Arthritis of knee [M17.10]  Procedure(s) (LRB):  LEFT TOTAL KNEE ARTHROPLASTY (Left) Day of Surgery   Precautions:   Fall  WBAT  PLOF: see above     ASSESSMENT :  Patient is 70 yo F admitted to hospital for L TKA and presents today POD 0 and agreeable to therapy. Patient was educated on weight bearing status, role of therapy, TE (see below), and equipment in room including role of SCD's, towel roll, and ice sleeve. Patient demonstrated full SLR against gravity and transferred to sitting EOB for objective assessment. Patient was given demo with instruction on sit <> stand transfer and gait training. Patient transferred to standing with CGA and ambulated approx 60 ft with CGA with use of RW, step through gait pattern noted, adequate knee flexion in swing.  At conclusion of session patient transferred  to sitting in recliner with LE elevated and was left resting with call bell by the side, SCD's donned, and towel roll under ankle. Patient educated on HEP per total knee, demonstrated understanding to ensure carryover Patient instructed to call for assistance if they needed to get up for any reason and denied need for further assistance. Patient demonstrates decreased strength, mobility, and endurance and is not yet cleared for discharge home, will continue to follow during acute stay and recommend [facility] with family support and RW at discharge.     Patient will benefit from skilled intervention to address the above impairments.  Patient's rehabilitation potential is considered to be Good  Factors which may influence rehabilitation potential include:   [x]          None noted  []          Mental ability/status  []          Medical condition  []          Home/family situation and support systems  []          Safety awareness  []          Pain tolerance/management  []          Other:      PLAN :  Recommendations and Planned Interventions:   [x]            Bed Mobility Training             [  x]    Neuromuscular Re-Education  [x]            Transfer Training                   []     Orthotic/Prosthetic Training  [x]            Gait Training                          []     Modalities  [x]            Therapeutic Exercises           []     Edema Management/Control  [x]            Therapeutic Activities            [x]     Family Training/Education  [x]            Patient Education  []            Other (comment):    Frequency/Duration: Patient will be followed by physical therapy 1-2 times per day/3-5 days per week to address goals.  Discharge Recommendations: Home Health with family support   Further Equipment Recommendations for Discharge: rolling walker     SUBJECTIVE:   Patient stated "this is my first knee but I need my right hip done too."    OBJECTIVE DATA SUMMARY:     Past Medical History:   Diagnosis Date    Arthritis     knee, arms,      Asthma     resolved after gastric bypass    Chronic obstructive pulmonary disease (HCC)     no meds, since gastric bypass    Chronic pain     Diabetes (HCC)     no meds    H/O sinusitis     Hx SBO     multiple surgeries prior to 2002    Hypercholesterolemia     Hypertension     Sleep apnea     NOT USING     Stress incontinence     Thrombocytopenia (HCC)     Toe fracture, left August 2015    4th toe     Past Surgical History:   Procedure Laterality Date    HX CARPAL TUNNEL RELEASE Bilateral     HX CHOLECYSTECTOMY      HX GYN      HX HERNIA REPAIR      abdomen    HX HYSTERECTOMY      tubes tied    HX LAP GASTRIC BYPASS  02/21/2018    HX ORTHOPAEDIC      catrpal tunnel right and left    HX POLYPECTOMY  04/18/14    PR ABDOMEN SURGERY PROC UNLISTED      4 surgeries for blockages     Barriers to Learning/Limitations: yes;  physical  Compensate with: Visual Cues, Verbal Cues, and Tactile Cues  Home Situation:  Home Situation  Home Environment: Private residence  # Steps to Enter: 3  Rails to Enter: No  One/Two Story Residence: One story  Living Alone: No  Support Systems: Child(ren),Other Family Member(s)  Patient Expects to be Discharged to:: Home with home health  Current DME Used/Available at Home: Environmental consultant, rolling  Critical Behavior:  Neurologic State: Alert  Orientation Level: Oriented X4  Cognition: Follows commands  Safety/Judgement: Fall prevention  Psychosocial  Patient Behaviors: Calm;Cooperative        Skin Integrity: Incision (comment) (  left leg)  Skin Integumentary  Skin Integrity: Incision (comment) (left leg)     Strength:    Strength: Generally decreased, functional (RLE WFL, LLE limited 2/2 TKA )                    Tone & Sensation:   Tone: Normal              Sensation: Intact               Range Of Motion:  AROM: Generally decreased, functional (RLE WFL, LLE limited 2/2 TKA )                       Posture:        Functional Mobility:  Bed Mobility:     Supine to Sit: Stand-by assistance     Scooting:  Stand-by assistance  Transfers:  Sit to Stand: Contact guard assistance  Stand to Sit: Stand-by assistance                       Balance:   Sitting: Intact  Standing: Intact;With support  Wheelchair Mobility:        Ambulation/Gait Training:  Distance (ft): 60 Feet (ft)  Assistive Device: Walker, rolling  Ambulation - Level of Assistance: Contact guard assistance        Gait Abnormalities: Decreased step clearance        Base of Support: Widened;Shift to right  Stance: Left decreased  Speed/Cadence: Pace decreased (<100 feet/min)  Step Length: Left shortened;Right shortened    Therapeutic Exercises:   Per total knee handbook- emphasis on quad sets, laq/heel slides, ankle pumps x10/hr     Pain:  Pain level pre-treatment: 5/10   Pain level post-treatment: 5/10   Pain Intervention(s) : Medication (see MAR); Rest, Ice, Repositioning  Response to intervention: Nurse notified    Activity Tolerance:   Good    Please refer to the flowsheet for vital signs taken during this treatment.  After treatment:   [x]          Patient left in no apparent distress sitting up in chair  []          Patient left in no apparent distress in bed  [x]          Call bell left within reach  [x]          Nursing notified  [x]          Caregiver present  []          Bed alarm activated  []          SCDs applied    COMMUNICATION/EDUCATION:   [x]          Role of Physical Therapy in the acute care setting.  [x]          Fall prevention education was provided and the patient/caregiver indicated understanding.  [x]          Patient/family have participated as able in goal setting and plan of care.  [x]          Patient/family agree to work toward stated goals and plan of care.  []          Patient understands intent and goals of therapy, but is neutral about his/her participation.  []          Patient is unable to participate in goal setting/plan of care: ongoing with therapy staff.  []          Other:  Thank you for this referral.  Megan Boykin   Time  Calculation: 20 mins      Eval Complexity: History: MEDIUM  Complexity : 1-2 comorbidities / personal factors will impact the outcome/ POC Exam:MEDIUM Complexity : 3 Standardized tests and measures addressing body structure, function, activity limitation and / or participation in recreation  Presentation: MEDIUM Complexity : Evolving with changing characteristics  Clinical Decision Making:Medium Complexity    Overall Complexity:MEDIUM

## 2020-10-28 NOTE — Op Note (Signed)
Brief Postoperative Note    Patient: Cheryl Paul  Date of Birth: 1950-08-08  MRN: [id number]    Date of Procedure: 10/28/2020     Pre-Op Diagnosis: Osteoarthritis of left knee, unspecified osteoarthritis type [M17.12] with ankylosis, fixed varus deformity, osteophytosis, osteoporosis    Post-Op Diagnosis: Same as preoperative diagnosis.      Procedure(s):  LEFT TOTAL KNEE ARTHROPLASTY    Surgeon(s):  Gifford Shave, MD    Surgical Assistant: Physician Assistant: Sharen Hones, PA-C  Surg Asst-1: Alesia Morin    Anesthesia: General     Estimated Blood Loss (mL): less than 50     Complications: None    Specimens: * No specimens in log *     Implants:   Implant Name Type Inv. Item Serial No. Manufacturer Lot No. LRB No. Used Action   CEMENT BNE 41GM FULL DOSE PMMA W/ TOBRA M VISC RADPQ - [id number]  CEMENT BNE 41GM FULL DOSE PMMA W/ TOBRA M VISC RADPQ  [employer] HOWM_WD [id number] Left 2 Implanted   BASEPLATE TIB SZ 5 [id number] ML74MM THK2. L KNEE TI ALLY NP - [id number]  BASEPLATE TIB SZ 5 LF81OF ML74MM THK2. L KNEE TI ALLY NP  [employer]_WD [id number] Left 1 Implanted   COMPONENT FEM SZ 8 L KNEE OXINIUM POST STBL NP PRI STEMLESS - [id number]  COMPONENT FEM SZ 8 L KNEE OXINIUM POST STBL NP PRI STEMLESS  [employer]_WD [id number] Left 1 Implanted   PAT ASYM TRIATHLN X3 35X10MM -- TRIATHLON ASYMMETRIC X3 - [id number]  PAT ASYM TRIATHLN X3 35X10MM -- TRIATHLON ASYMMETRIC X3  [employer] HOWM_WD [id number] Left 1 Implanted   INSERT TIB SZ 5-6 THK10MMXLPE KNEE POST STBL HI FLX LEGION - [id number]  INSERT TIB SZ 5-6 THK10MMXLPE KNEE POST STBL HI FLX LEGION  [employer]_WD [id number] Left 1 Implanted       Drains: * No LDAs found *    Findings: same    Electronically Signed by Gifford Shave, MD on 10/28/2020 at 11:46 AM

## 2020-10-28 NOTE — Interval H&P Note (Signed)
Spoke With [facility] (pharmacist) and patient to review Acetaminophen/codein alg. And per patient and pharmacist she can take regular tylenol

## 2020-10-28 NOTE — ACP (Advance Care Planning) (Signed)
Advance Care Planning   Advance Care Planning Inpatient Note  [facility]  Spiritual Care Department    Today's Date: 10/28/2020  Unit: [facility] 2 SURGICAL    Received request from admission screening. Upon review of chart and communication with care team, patient's decision making abilities are not in question. Patient was/were present in the room during visit.    Goals of ACP Conversation:  Discuss Advance Care planning documents  Facilitate a discussion related to patient's goals of care as they align with the patient's values and beliefs    Health Care Decision Makers:      Primary Decision Maker (Active): Lawrence - Daughter - [phone]    Secondary Decision Maker: Normajean Baxter - [phone]      Summary:  Completed New Documents  Verified Healthcare Decision Maker  Updated Healthcare Decision Maker    Advance Care Planning Documents (Patient Wishes) on file:  Healthcare Power of Attorney/Advance Directive appointment of Health care agent  Living Will/ Advance Directive     Assessment:    Patient wants all treatment even if she has a terminal condition where medical treatment will not help her recover.         Interventions:  Provided education on documents for clarity and greater understanding  Assisted in the completion of documents according to patient's wishes at this time  Encouraged ongoing ACP conversation with future decision makers and loved ones    Care Preferences Communicated:  No    Outcomes/Plan:  New Advance Directive completed  Copy of Advance Directive given to staff to scan into medical record    Eilene Ghazi, [facility] on 10/28/2020 at 3:46 PM

## 2020-10-28 NOTE — Progress Notes (Signed)
 Chaplain conducted an initial consultation and Spiritual Assessment for Cheryl Paul, who is a 70 y.o.,female. Patient's Primary Language is: [place].   According to the patient's EMR Religious Affiliation is: Pentecostal.     The reason the Patient came to the hospital is:   Patient Active Problem List    Diagnosis Date Noted   . Arthritis of knee 10/28/2020   . Thrombocytopenia, unspecified 09/04/2020   . Mild intermittent asthma 01/11/2019   . Hypokalemia 05/15/2018   . Hypomagnesemia 05/15/2018   . Syncope 05/15/2018   . Elevated troponin 05/15/2018   . Short gut syndrome 05/15/2018   . Hypocalcemia 05/15/2018   . Internal hemorrhoids 04/18/2014   . Colon polyps 04/18/2014   . Vitamin D  deficiency 02/19/2014   . Chronic infection of sinus 11/14/2013   . Type 2 diabetes mellitus without complication (HCC) 11/14/2013   . Incontinence in female 11/14/2013   . Candidal intertrigo 11/14/2013   . Arthritis, multiple joint involvement 11/14/2013   . Essential hypertension 11/14/2013   . COPD (chronic obstructive pulmonary disease) (HCC) 11/14/2013   . Hyperlipidemia 11/14/2013        The Chaplain provided the following Interventions:  Initiated a relationship of care and support.   Listened empathically.  Provided information about Spiritual Care Services.  Chart reviewed.    The following outcomes where achieved:  Assisted patient with the execution of Advance Medical Directive.  Placed the copy into patient's like paper chart.  See Flow Sheet for other pastoral interventions.  Chaplains will continue to follow and provide pastoral care on an as needed/requested basis.    Assessment:  Patient does not have any religious/cultural needs that will affect patient's preferences in health care.  There are no spiritual or religious issues which require intervention at this time.     Plan:  Chaplains will continue to follow and will provide pastoral care on an as needed/requested basis.  Chaplain recommends bedside  caregivers page chaplain on duty if patient shows signs of acute spiritual or emotional distress.    Chaplain Cherene Rake  Spiritual Care  [phone])

## 2020-10-28 NOTE — Op Note (Signed)
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    PROCEDURES PERFORMED:  1.  Left total knee replacement using the [employer] II system with a size 8 left posterior-stabilized Genesis II Oxinium femoral component, a size 5 tibia, a size 5-6 10-mm high flexion articular insert and a 35 asymmetric patella.  2.  Removal of ankylosing osteophytosis.  3.  Rebalancing of knee.    SURGEON:  Gifford Shave, MD    FIRST ASSISTANT:  Carin Hock    SECOND ASSISTANT:  Merian Capron    ANESTHESIOLOGIST:  Dr. Nedra Hai    ANESTHESIA:  Preoperative nerve block with light general.    COMPLICATIONS:  No complications.    SPECIMENS REMOVED:  No specimen.    IMPLANTS:  As above mentioned.    ESTIMATED BLOOD LOSS:  Less than 50.    J. RHyacinth Meeker was the first assistant who assisted with all phases of the surgery commencing with patient positioning, patient prep, patient drape, leg positioning during surgery, retracting, assisting with the surgery itself, closure, dressing placement, and transfer.    DESCRIPTION OF PROCEDURE:  After the anesthetic was successfully induced, it was confirmed the patient did receive her antibiotics, time-out performed.  Midline incision.  Knee debrided in usual fashion.  Femoral canal aspirated, lavaged, and re-aspirated prior to instrumentation.  A modified gap balancing would be performed.  The leg came out fairly straight prior to surgery, a little bit of fixed flexion deformity, was very  ankylotic in flexion.  We removed a horseshoe pattern of osteophytes around the femur, also around the patella, and during the balancing phase, we would decompress the proximal medial tibia as well removing the osteophyte proximally.  Neurovascular bundle protected at all times and again a modified gap balancing technique would be performed.  We made preliminary femoral cuts and switched our attention to the tibia, used the external alignment guide with appropriate landmarks and resected enough to get a decent clean-up cut ensuring no skive.  We then removed posterior osteophytes while protecting neurovascular bundle and there was this small Dixie cup of bone spurs both medially and laterally removed while protecting the medial and lateral structures and medial meniscus was actually flipped behind the osteophytes and was also resected as well.  Aquamantys and Exparel cocktail, again protecting neurovascular bundle.  Modified gap balancing technique thus confirmed correct femoral rotation.  We did have to add a fair bit to make it balance perfectly which I was delighted with and we were nicely balanced between medial and lateral flexion and extension.  We then did our femoral finishing followed by placement of the trial components to set our tibial rotation which was marked and later re-punched.    We then resurfaced the patella restoring patellar fitness anatomically and using a rongeur to smooth out the edges with all the trial components in place, we checked the overall alignment, range of motion, soft tissue balance, patellar tracking, stability and alignment, all of which we were  delighted with.  Fashioned a bone plug for the femoral canal.  Further control of hemostasis.  Cemented in the knee removing all extraneous cement holding the knee in full extension, the cement was fully cured.  Further cement removal, pulse lavage and trialing and I was happiest with the 10, locked it in place, reduced the knee and  which appear with gravity the knee bends easily to about 115 degrees and came out nice and straight, very stable.  Patella was tracking beautifully.    Irrisept protocol and routine closure.  Fully flexed the knee prior to closure of the skin.  Excellent result for the case.      Gifford Shave, MD      [id number]  D:  10/28/2020 12:02  T:  10/28/2020 23:00  JOB #:  [id number]

## 2020-10-28 NOTE — Anesthesia Pre-Procedure Evaluation (Signed)
Relevant Problems   No relevant active problems       Anesthetic History   No history of anesthetic complications            Review of Systems / Medical History  Patient summary reviewed and pertinent labs reviewed    Pulmonary    COPD: mild    Sleep apnea: CPAP    Asthma : well controlled       Neuro/Psych   Within defined limits           Cardiovascular    Hypertension: well controlled              Exercise tolerance: >4 METS     GI/Hepatic/Renal                Endo/Other    Diabetes: well controlled, type 2    Arthritis     Other Findings              Physical Exam    Airway  Mallampati: III  TM Distance: 4 - 6 cm  Neck ROM: decreased range of motion   Mouth opening: Normal     Cardiovascular    Rhythm: regular  Rate: normal         Dental    Dentition: Poor dentition     Pulmonary  Breath sounds clear to auscultation               Abdominal  GI exam deferred       Other Findings            Anesthetic Plan    ASA: 3  Anesthesia type: general and regional          Induction: Intravenous  Anesthetic plan and risks discussed with: Patient

## 2020-10-28 NOTE — Interval H&P Note (Signed)
 TRANSFER - OUT REPORT:    Verbal report given to Leigh(name) on Cheryl Paul  being transferred to 2 Surgical(unit) for routine post - op       Report consisted of patient's Situation, Background, Assessment and   Recommendations(SBAR).     Information from the following report(s) SBAR, Kardex, Procedure Summary and MAR was reviewed with the receiving nurse.    Lines:   Peripheral IV 10/28/20 Anterior;Right Forearm (Active)   Site Assessment Clean, dry, & intact 10/28/20 1000   Phlebitis Assessment 0 10/28/20 1000   Infiltration Assessment 0 10/28/20 1000   Dressing Status Clean, dry, & intact 10/28/20 1000   Dressing Type Tape;Transparent 10/28/20 1000   Hub Color/Line Status Pink;Infusing 10/28/20 1000        Opportunity for questions and clarification was provided.      Patient transported with:  Hospital Transporter and personal belongings

## 2020-10-28 NOTE — Anesthesia Procedure Notes (Signed)
Peripheral Block    Start time: 10/28/2020 9:38 AM  End time: 10/28/2020 9:48 AM  Performed by: Billy Fischer, MD  Authorized by: Billy Fischer, MD       Pre-procedure:   Indications: at surgeon's request and post-op pain management    Preanesthetic Checklist: patient identified, risks and benefits discussed, site marked, timeout performed, anesthesia consent given and patient being monitored    Timeout Time: 09:38 EDT          Block Type:   Block Type:  Adductor canal block  Laterality:  Left  Monitoring:  Responsive to questions, standard ASA monitoring, continuous pulse ox, oxygen, frequent vital sign checks and heart rate  Injection Technique:  Single shot  Procedures: ultrasound guided    Patient Position: supine  Prep: chlorhexidine    Needle Gauge:  21 G  Medication Injected:  FentaNYL citrate (PF) injection sedation initial, 100 mcg  midazolam (VERSED) injection, 2 mg  ropivacaine (NAROPIN) 2 mg/mL (0.2 %) injection, 60 mg  Med Admin Time: 10/28/2020 9:44 AM    Assessment:  Number of attempts:  1  Injection Assessment:  Incremental injection every 5 mL, negative aspiration for CSF, no paresthesia, ultrasound image on chart, low pressure verified by pressure monitor, no intravascular symptoms, negative aspiration for blood and local visualized surrounding nerve on ultrasound  Patient tolerance:  Patient tolerated the procedure well with no immediate complications

## 2020-10-28 NOTE — Anesthesia Post-Procedure Evaluation (Signed)
Procedure(s):  LEFT TOTAL KNEE ARTHROPLASTY.    general, regional    Anesthesia Post Evaluation      Multimodal analgesia: multimodal analgesia used between 6 hours prior to anesthesia start to PACU discharge  Patient location during evaluation: bedside  Patient participation: complete - patient participated  Level of consciousness: awake  Pain management: adequate  Airway patency: patent  Anesthetic complications: no  Cardiovascular status: stable  Respiratory status: acceptable  Hydration status: acceptable  Post anesthesia nausea and vomiting:  controlled      INITIAL Post-op Vital signs:   Vitals Value Taken Time   BP 124/69 10/28/20 1311   Temp 36.6 ??C (97.8 ??F) 10/28/20 1311   Pulse 67 10/28/20 1320   Resp 10 10/28/20 1320   SpO2 97 % 10/28/20 1320   Vitals shown include unvalidated device data.

## 2020-10-29 LAB — POCT GLUCOSE: POC Glucose: 94 mg/dL (ref 70–110)

## 2020-10-29 LAB — GLUCOSE, POC: Glucose (POC): 94 mg/dL (ref 70–110)

## 2020-10-29 MED FILL — FERROUS SULFATE 325 MG (65 MG ELEMENTAL IRON) TAB: 325 mg (65 mg iron) | ORAL | Qty: 1

## 2020-10-29 MED FILL — ACETAMINOPHEN 500 MG TAB: 500 mg | ORAL | Qty: 2

## 2020-10-29 MED FILL — CLINDAMYCIN PHOSPHATE 150 MG/ML IJ SOLN: 150 mg/mL | INTRAMUSCULAR | Qty: 6

## 2020-10-29 MED FILL — BAYER LOW DOSE ASPIRIN 81 MG TABLET,DELAYED RELEASE: 81 mg | ORAL | Qty: 1

## 2020-10-29 MED FILL — KLOR-CON M20 MEQ TABLET,EXTENDED RELEASE: 20 mEq | ORAL | Qty: 1

## 2020-10-29 MED FILL — MAGNESIUM OXIDE 400 MG TAB: 400 mg | ORAL | Qty: 1

## 2020-10-29 MED FILL — CELECOXIB 100 MG CAP: 100 mg | ORAL | Qty: 2

## 2020-10-29 MED FILL — SENNOSIDES-DOCUSATE SODIUM 8.6 MG-50 MG TAB: ORAL | Qty: 1

## 2020-10-29 MED FILL — SPIRONOLACTONE 25 MG TAB: 25 mg | ORAL | Qty: 1

## 2020-10-29 MED FILL — KETOROLAC TROMETHAMINE 15 MG/ML INJECTION: 15 mg/mL | INTRAMUSCULAR | Qty: 1

## 2020-10-29 MED FILL — LYRICA 25 MG CAPSULE: 25 mg | ORAL | Qty: 1

## 2020-10-29 MED FILL — OXYCODONE 5 MG TAB: 5 mg | ORAL | Qty: 3

## 2020-10-29 NOTE — Progress Notes (Signed)
 Problem: Patient Education: Go to Patient Education Activity  Goal: Patient/Family Education  Outcome: Resolved/Met   OCCUPATIONAL THERAPY EVALUATION/DISCHARGE    Patient: Cheryl Paul (70 y.o. female)  Date: 10/29/2020  Primary Diagnosis: Osteoarthritis of left knee, unspecified osteoarthritis type [M17.12]  Arthritis of knee [M17.10]  Procedure(s) (LRB):  LEFT TOTAL KNEE ARTHROPLASTY (Left) 1 Day Post-Op   Precautions:   Fall,WBAT,Skin  PLOF: Mod I with ADLs and functional mobility w/o AD    ASSESSMENT AND RECOMMENDATIONS:  Nursing/RN cleared for pt to participate in OT evaluation and tx session. Patient presents sitting up in recliner, grandson present. LB dress: Mod I doff and don slipper sock w/ Good sitting balance using bending forward method. Toilet transfer: Mod I using RW with good safety awareness and good balance, Mod I toilet tasks and washing hands in stance at sink w/ RW. Patient verbalized understanding to utilize call bell to request assist as needed. Nursing notified of pt's level of assist with toilet transfer using RW.  Patient presents close at baseline as PLOF with ADLs and functional mobility with supportive family to assist for safe d/c home. Patient verbalized understanding of skilled OT services not indicated at this time while in acute hospital.   Skilled occupational therapy is not indicated at this time.  Discharge Recommendations: Home Health  Further Equipment Recommendations for Discharge: rolling walker      SUBJECTIVE:   Patient stated "I have lots of family around to help me."    OBJECTIVE DATA SUMMARY:     Past Medical History:   Diagnosis Date   . Arthritis     knee, arms,    . Asthma     resolved after gastric bypass   . Chronic obstructive pulmonary disease (HCC)     no meds, since gastric bypass   . Chronic pain    . Diabetes (HCC)     no meds   . H/O sinusitis    . Hx SBO     multiple surgeries prior to 2002   . Hypercholesterolemia    . Hypertension    . Sleep apnea      NOT USING    . Stress incontinence    . Thrombocytopenia (HCC)    . Toe fracture, left August 2015    4th toe     Past Surgical History:   Procedure Laterality Date   . HX CARPAL TUNNEL RELEASE Bilateral    . HX CHOLECYSTECTOMY     . HX GYN     . HX HERNIA REPAIR      abdomen   . HX HYSTERECTOMY      tubes tied   . HX LAP GASTRIC BYPASS  02/21/2018   . HX ORTHOPAEDIC      catrpal tunnel right and left   . HX POLYPECTOMY  04/18/14   . PR ABDOMEN SURGERY PROC UNLISTED      4 surgeries for blockages     Barriers to Learning/Limitations: None  Compensate with: visual, verbal, tactile, kinesthetic cues/model    Home Situation:   Home Situation  Home Environment: Private residence  # Steps to Enter: 0  Rails to Enter: No  One/Two Story Residence: One story  Living Alone: No  Support Systems: Child(ren),Other Family Member(s)  Patient Expects to be Discharged to:: Home with home health  Current DME Used/Available at Home: Raised toilet seat,Commode, bedside,Shower chair  Tub or Shower Type: Shower  [x]      Right hand dominant   []   Left hand dominant    Cognitive/Behavioral Status:  Neurologic State: Alert  Orientation Level: Oriented X4  Cognition: Follows commands  Safety/Judgement: Fall prevention    Skin: s/p (L) TKR  Edema: s/p (L) TKR    Vision/Perceptual:  appears intact, able to tell correct time on wall clock  Corrective Lenses: Glasses    Coordination: BUE  Coordination: Within functional limits  Fine Motor Skills-Upper: Left Intact;Right Intact    Gross Motor Skills-Upper: Left Intact;Right Intact    Balance:  Sitting: Intact  Standing: Impaired;With support  Standing - Static: Good  Standing - Dynamic : Good    Strength: BUE  Strength: Within functional limits    Tone & Sensation: BUE  Tone: Normal  Sensation: Intact     Range of Motion: BUE  AROM: Within functional limits    Functional Mobility and Transfers for ADLs:  Bed Mobility:     Supine to Sit: Modified independent     Transfers:  Sit to Stand:  Modified independent  Stand to Sit: Modified independent      Toilet Transfer : Modified independent      Bathroom Mobility: Modified independent    ADL Assessment:  Feeding: Modified independent    Oral Facial Hygiene/Grooming: Modified Independent    Bathing: Supervision    Upper Body Dressing: Modified independent    Lower Body Dressing: Modified independent    Toileting: Modified independent     ADL Intervention:  Grooming  Position Performed: Standing  Washing Hands: Modified independent  Lower Body Dressing Assistance  Socks: Modified independent  Position Performed: Bending forward method;Seated in chair    Toileting  Toileting Assistance: Modified independent  Bladder Hygiene: Modified independent    Cognitive Retraining  Safety/Judgement: Fall prevention    Pain:  Pain level pre-treatment: 0/10   Pain level post-treatment: 0/10     Activity Tolerance:   good  Please refer to the flowsheet for vital signs taken during this treatment.  After treatment:   [x]   Patient left in no apparent distress sitting up in chair  []   Patient left in no apparent distress in bed  [x]   Call bell left within reach  [x]   Nursing notified  []   Caregiver present  []   Bed alarm activated    COMMUNICATION/EDUCATION:   [x]       Role of Occupational Therapy in the acute care setting  [x]       Home safety education was provided and the patient/caregiver indicated understanding.  [x]       Patient/family have participated as able and agree with findings and recommendations.  []       Patient is unable to participate in plan of care at this time.    Thank you for this referral.  Abigail BRAVO Lausier  Time Calculation: 8 mins      Eval Complexity: History: MEDIUM Complexity : Expanded review of history including physical, cognitive and psychosocial  history ;   Examination: MEDIUM Complexity : 3-5 performance deficits relating to physical, cognitive , or psychosocial skils that result in activity limitations and / or participation  restrictions;   Decision Making:MEDIUM Complexity : Patient may present with comorbidities that affect occupational performnce. Miniml to moderate modification of tasks or assistance (eg, physical or verbal ) with assesment(s) is necessary to enable patient to complete evaluation

## 2020-10-29 NOTE — Progress Notes (Signed)
Home health orders sent to [facility].  Hae-Jin was notified      Discharge order noted for today. Pt has been accepted to [facility] agency. Met with patient  and are agreeable to the transition plan today. Transport has been arranged through family. Patient's discharge summary and home health  orders have been forwarded to [facility] home health  agency. Updated bedside RN, ,  to the transition plan.  Discharge information has been documented on the AVS.         Rolling walker for home use by [employer] delvered to pt at bedsides.          Lequita Halt, BSN RN  Care Management  Pager: [phone]

## 2020-10-29 NOTE — Progress Notes (Signed)
 Problem: Mobility Impaired (Adult and Pediatric)  Goal: *Acute Goals and Plan of Care (Insert Text)  Description: Physical Therapy Goals  Initiated 10/28/2020 and to be accomplished within 7 day(s)  1.  Patient will move from supine to sit and sit to supine , scoot up and down, and roll side to side in bed with modified independence.    2.  Patient will transfer from bed to chair and chair to bed with modified independence using the least restrictive device.  3.  Patient will perform sit to stand with modified independence.  4.  Patient will ambulate with supervision/set-up for 150 feet with the least restrictive device.   5.  Patient will ascend/descend 3 stairs with 0 handrail(s) with supervision/set-up.  6.  Patient to perform TKA therex as prescribed to LLE.     PLOF: Pt lives with many family members in a 1 SH ( attached suite ) with 3 STE no HR, reports she couldn't find her walker, mod ind PTA      Outcome: Resolved/Met   PHYSICAL THERAPY TREATMENT AND DISCHARGE    Patient: Cheryl Paul (70 y.o. female)  Date: 10/29/2020  Diagnosis: Osteoarthritis of left knee, unspecified osteoarthritis type [M17.12]  Arthritis of knee [M17.10]   Procedure(s) (LRB):  LEFT TOTAL KNEE ARTHROPLASTY (Left) 1 Day Post-Op  Precautions: Fall,WBAT (LLE)  ASSESSMENT:  Seated in bed. Mod I for supine to sit and sit to stand transfers. Amb 278ft with ww and steady gait. Completed 4 steps with good safety awareness. Returned to room and [employer] with mod I for transfers and balance. Seated in chair. Reviewed seated BLE exercises. Ice applied to left knee. Seated in chair with BLE elevated. Denies further mobility concerns with discharge to home. Call bell in reach.    Patient is cleared by PT for discharge to home with post acute rehab services and family support.      PLAN:  Further skilled physical therapy is not indicated at this time.  Rationale for discharge:  [x]      Goals Achieved  []      Plateau Reached  []       Patient not participating in therapy  []      Other:  Discharge Recommendations:  Home Health/outpatient  Further Equipment Recommendations for Discharge:  rolling walker     SUBJECTIVE:   Patient stated "I need a rolling walker for home."    OBJECTIVE DATA SUMMARY:   Critical Behavior:  Neurologic State: Alert  Orientation Level: Oriented X4  Cognition: Follows commands  Safety/Judgement: Fall prevention  Psychosocial  Patient Behaviors: Cooperative  Functional Mobility Training:  Bed Mobility:  Supine to Sit: Modified independent  Transfers:  Sit to Stand: Modified independent  Stand to Sit: Modified independent  Balance:   Sitting: Intact  Standing: Impaired  Standing - Static: Good  Standing - Dynamic : Good  Ambulation/Gait Training:  Distance (ft): 200 Feet (ft)   Assistive Device: Walker, rolling  Ambulation - Level of Assistance: Modified independent  Stairs:   Level of Assistance: Modified independent  Rail Use: both; educated on use of family for handrails at home  Number of Stairs: 4    Neuro Re-Education:  Seated balance 3 minutes  Standing balance 5 minutes  Therapeutic Exercises:   Sit to stand x2  Seated AROM; ankle pumps, knee flexion, quad sets    Pain:  Pain level pre-treatment: 0/10   Pain level post-treatment: 0/10     Activity Tolerance:   Good  After treatment:   [x]  Patient left in no apparent distress sitting up in chair  []  Patient left in no apparent distress in bed  [x]  Call bell left within reach  [x]  Nursing notified  []  Caregiver present  []  Bed alarm activated  []  SCDs applied      COMMUNICATION/EDUCATION:   [x]          Role of physical therapy in the acute care setting.  [x]          Fall prevention education was provided and the patient/caregiver indicated understanding.  [x]          Patient/family have participated as able and agree with findings and recommendations.  []          Patient is unable to participate in plan of care at this time.       Edsel LOISE Loll, PT   Time  Calculation: 19 mins

## 2020-10-29 NOTE — Progress Notes (Signed)
Ortho    Pt. Seen and evaluated.   Doing well, up in chair, pain well controlled  Progressed well with PT  Denies cp, sob, abd pain    Blood pressure 132/67, pulse 77, temperature 97.9 ??F (36.6 ??C), resp. rate 21, height 5\' 3"  (1.6 m), weight 199 lb (90.3 kg), SpO2 98 %, not currently breastfeeding.   leftKnee woundclean, dry, no drainage  Sensory intact to LT  Motor intact  nv intact  Neg calf tenderness    Labs:  CBC  @  CBC:   Lab Results   Component Value Date/Time    WBC 7.4 10/21/2020 07:48 AM    RBC 4.62 10/21/2020 07:48 AM    HGB 12.9 10/21/2020 07:48 AM    HCT 41.3 10/21/2020 07:48 AM    PLATELET 126 (L) 10/21/2020 07:48 AM     BMP:   Lab Results   Component Value Date/Time    Glucose 108 (H) 10/21/2020 07:48 AM    Sodium 143 10/21/2020 07:48 AM    Potassium 4.0 10/21/2020 07:48 AM    Chloride 111 10/21/2020 07:48 AM    CO2 29 10/21/2020 07:48 AM    BUN 11 10/21/2020 07:48 AM    Creatinine 0.62 10/21/2020 07:48 AM    Calcium 9.0 10/21/2020 07:48 AM   @  Coagulation  Lab Results   Component Value Date    INR 1.0 10/21/2020    APTT 26.5 10/21/2020      Basic Metabolic Profile  Lab Results   Component Value Date    NA 143 10/21/2020    CO2 29 10/21/2020    BUN 11 10/21/2020       Assesment: leftOrthopedic / Rheumatologic: Total Knee Replacement  Past Medical History:   Diagnosis Date   ??? Arthritis     knee, arms,    ??? Asthma     resolved after gastric bypass   ??? Chronic obstructive pulmonary disease (HCC)     no meds, since gastric bypass   ??? Chronic pain    ??? Diabetes (HCC)     no meds   ??? H/O sinusitis    ??? Hx SBO     multiple surgeries prior to 2002   ??? Hypercholesterolemia    ??? Hypertension    ??? Sleep apnea     NOT USING    ??? Stress incontinence    ??? Thrombocytopenia (HCC)    ??? Toe fracture, left August 2015    4th toe       Plan: aspirin, PT- wbat.  Plan for home once cleared by PT

## 2020-10-29 NOTE — Discharge Summary (Signed)
10/28/2020  7:50 AM    10/29/2020, 10:09 AM    Primary IO:XBDZ Orthopedic / Rheumatologic: Total Knee Replacement  Secondary Dx: Etiological Diagnoses: none    HPI:  Pt has end stage OA of their left knee and had failed conservative treatment.  Due to the current findings and affected activity of daily living surgical intervention is indicated.  The alternatives, risks, complications as well as expected outcome were discussed, the patient understands and wishes to proceed with surgery    Past Medical History:   Diagnosis Date   ??? Arthritis     knee, arms,    ??? Asthma     resolved after gastric bypass   ??? Chronic obstructive pulmonary disease (HCC)     no meds, since gastric bypass   ??? Chronic pain    ??? Diabetes (HCC)     no meds   ??? H/O sinusitis    ??? Hx SBO     multiple surgeries prior to 2002   ??? Hypercholesterolemia    ??? Hypertension    ??? Sleep apnea     NOT USING    ??? Stress incontinence    ??? Thrombocytopenia (HCC)    ??? Toe fracture, left August 2015    4th toe         Current Facility-Administered Medications:   ???  magnesium oxide (MAG-OX) tablet 400 mg, 400 mg, Oral, DAILY, Gifford Shave, MD, 400 mg at 10/29/20 0910  ???  potassium chloride (K-DUR, KLOR-CON M20) SR tablet 20 mEq, 20 mEq, Oral, BID, Gifford Shave, MD, 20 mEq at 10/29/20 3299  ???  spironolactone (ALDACTONE) tablet 25 mg, 25 mg, Oral, DAILY, Gifford Shave, MD, 25 mg at 10/29/20 0910  ???  0.9% sodium chloride infusion, 100 mL/hr, IntraVENous, CONTINUOUS, Gifford Shave, MD, Last Rate: 100 mL/hr at 10/29/20 0911, 100 mL/hr at 10/29/20 0911  ???  sodium chloride (NS) flush 5-40 mL, 5-40 mL, IntraVENous, Q8H, Gifford Shave, MD, 10 mL at 10/28/20 2146  ???  sodium chloride (NS) flush 5-40 mL, 5-40 mL, IntraVENous, PRN, Gifford Shave, MD  ???  acetaminophen (TYLENOL) tablet 1,000 mg, 1,000 mg, Oral, Q6H, Gifford Shave, MD, 1,000 mg at 10/29/20 2426  ???  oxyCODONE IR (ROXICODONE) tablet 5-15 mg, 5-15 mg, Oral, Q3H PRN, Gifford Shave, MD, 15 mg at  10/29/20 0908  ???  oxyCODONE IR (ROXICODONE) tablet 20 mg, 20 mg, Oral, Q3H PRN, Gifford Shave, MD  ???  celecoxib (CELEBREX) capsule 200 mg, 200 mg, Oral, BID, Gifford Shave, MD, 200 mg at 10/29/20 0911  ???  naloxone (NARCAN) injection 0.4 mg, 0.4 mg, IntraVENous, PRN, Gifford Shave, MD  ???  flumazeniL (ROMAZICON) 0.1 mg/mL injection 0.2 mg, 0.2 mg, IntraVENous, PRN, Gifford Shave, MD  ???  aspirin delayed-release tablet 81 mg, 81 mg, Oral, BID, Gifford Shave, MD, 81 mg at 10/29/20 8341  ???  ferrous sulfate tablet 325 mg, 1 Tablet, Oral, BID WITH MEALS, Gifford Shave, MD, 325 mg at 10/29/20 0911  ???  ondansetron (ZOFRAN) injection 4 mg, 4 mg, IntraVENous, Q4H PRN, Gifford Shave, MD  ???  senna-docusate (PERICOLACE) 8.6-50 mg per tablet 1 Tablet, 1 Tablet, Oral, BID, Gifford Shave, MD, 1 Tablet at 10/29/20 0910  ???  ketorolac (TORADOL) injection 15 mg, 15 mg, IntraVENous, Q6H, Gifford Shave, MD, 15 mg at 10/29/20 0911  ???  pregabalin (LYRICA) capsule 25 mg, 25 mg, Oral, BID, Marlow,  Harle Stanford, MD, 25 mg at 10/29/20 0910    Latex; Penicillins; Acetaminophen-codeine; Butrans [buprenorphine]; Codeine; Iodine; Lactose; and Other plant, animal, environmental    Physical Exam:  General A&O x3 NAD, well developed, well nourished, normal affect  Heart: S1-S2, RRR  Lungs: CTA Bilat  Abd: soft NT, ND  Ext: n/v intact    Hospital Course:    Pt. Had leftOrthopedic / Rheumatologic: Total Knee Replacement    Post -op Course:  The patient tolerated the procedure well.  Pt. Was place on Abx pre and post-op for prophylaxis against infection as well as aspirin post-op for prophylaxis against DVT.    Vitals signs remained stable, remained af.  The wound wasclean, dry, no drainage.  Pain was well controlled.  Pt. Had negative calf tenderness or swelling, no evidence for DVT.  Patient had PT/OT consult for evaluation and treatment.    CBC  Lab Results   Component Value Date/Time    WBC 7.4 10/21/2020 07:48 AM    RBC 4.62 10/21/2020  07:48 AM    HCT 41.3 10/21/2020 07:48 AM    MCV 89.4 10/21/2020 07:48 AM    MCH 27.9 10/21/2020 07:48 AM    MCHC 31.2 10/21/2020 07:48 AM    RDW 13.6 10/21/2020 07:48 AM     Coagulation  Lab Results   Component Value Date    INR 1.0 10/21/2020    APTT 26.5 10/21/2020      Basic Metabolic Profile  Lab Results   Component Value Date    NA 143 10/21/2020    CO2 29 10/21/2020    BUN 11 10/21/2020       Discharge Meds:  Current Discharge Medication List      START taking these medications    Details   oxyCODONE-acetaminophen (Percocet) 10-325 mg per tablet Take 1-2 Tablets by mouth every six (6) hours as needed for Pain for up to 7 days. Max Daily Amount: 8 Tablets.  Qty: 56 Tablet, Refills: 0    Associated Diagnoses: Arthritis of knee      aspirin delayed-release 81 mg tablet Take 1 Tablet by mouth two (2) times a day.  Qty: 60 Tablet, Refills: 1    Associated Diagnoses: Arthritis of knee      docusate sodium (Colace) 100 mg capsule Take 1 Capsule by mouth two (2) times a day for 90 days.  Qty: 60 Capsule, Refills: 2    Associated Diagnoses: Arthritis of knee      clindamycin (CLEOCIN) 300 mg capsule Take 1 Capsule by mouth three (3) times daily for 3 days.  Qty: 9 Capsule, Refills: 0    Associated Diagnoses: Arthritis of knee      celecoxib (CeleBREX) 200 mg capsule Take 1 Capsule by mouth two (2) times a day for 90 days.  Qty: 60 Capsule, Refills: 2    Associated Diagnoses: Arthritis of knee         CONTINUE these medications which have NOT CHANGED    Details   spironolactone (ALDACTONE) 25 mg tablet Take 1 Tablet by mouth daily.  Qty: 90 Tablet, Refills: 1      potassium chloride (K-DUR, KLOR-CON M20) 20 mEq tablet Take 2 Tablets by mouth three (3) times daily.  Qty: 270 Tablet, Refills: 0    Associated Diagnoses: Hypokalemia      nystatin (MYCOSTATIN) topical cream Apply  to affected area two (2) times a day.  Qty: 15 g, Refills: 1    Associated Diagnoses: Candidal intertrigo  ergocalciferol (ERGOCALCIFEROL)  1,250 mcg (50,000 unit) capsule Take 1 Capsule by mouth every seven (7) days.  Qty: 12 Capsule, Refills: 3      magnesium oxide (MAG-OX) 400 mg tablet Take 1 Tablet by mouth daily.  Qty: 90 Tablet, Refills: 0    Associated Diagnoses: Hypokalemia      calcium citrate 200 mg (950 mg) tablet Take 1 Tab by mouth daily. Indications: malabsorption  Qty: 30 Tab, Refills: 0      multivitamin (ONE A DAY) tablet Take 1 Tab by mouth daily.      thiamine HCL (B-1) 100 mg tablet Take  by mouth daily.      cyanocobalamin 1,000 mcg tablet Take 1,000 mcg by mouth daily.         STOP taking these medications       diclofenac (VOLTAREN) 1 % gel Comments:   Reason for Stopping:               Discharge Plan:  The patient will be d/c'd to home, total knee protocol, WBAT.  They will have HH PT and nursing.  Total joint protocol.  Pt safe for homebound transfer, after being cleared by PT, sp Total joint replacement.  A walker, bedside commode, and shower chair will be utilized for ADL's.  Follow up with Dr. Meyer Cory in 14 days.  Call with any questions or concerns.

## 2020-10-29 NOTE — Home Health (Signed)
Received home health referral for [facility] for (SN, PT, OT).  Spoke with patient via phone;  patient identifiers verified.  Explained home care services and routines.  Demographics verified including insurance, phone and address confirmed.  Patient has the following DME: has rolling walker, shower chair & BSC.   Caregivers available family available for assistance.    Orders noted and arranged to be processed to central intake.      ----    Hae Rudean Curt, LPN  [facility] Care Liaison

## 2020-10-29 NOTE — Progress Notes (Signed)
 Reason for Admission:  Osteoarthritis of left knee, unspecified osteoarthritis type [M17.12]  Arthritis of knee [M17.10]                 RUR Score:    N/A           Plan for utilizing home health:    Yes                       Likelihood of Readmission:   LOW                         Transition of Care Plan:              Initial assessment completed with patient. Cognitive status of patient: oriented to time, place, person and situation.     Face sheet information confirmed:  yes.  The patient designates her daughter Dejane Scheibe  to participate in her discharge plan and to receive any needed information. This patient lives in a single family home with daughter and daughter's family.  Patient is able to navigate steps as needed.  Prior to hospitalization, patient was considered to be independent with ADLs/IADLS : yes .   Patient has a current ACP document on file: no      Healthcare Decision Maker:   Primary Decision Maker (Active): [place] - Daughter - [phone]    Secondary Decision Maker: Sammy Alm GLENWOOD Gilmer - [phone]    Click here to complete [employer] including selection of the Healthcare Decision Maker Relationship (ie Primary)    The son will be available to transport patient home upon discharge.   The patient already has [employer], Paediatric nurse, BSC, medical equipment available in the home.     Patient is not currently active with home health.  Patient has not stayed in a skilled nursing facility or rehab.  Was  stay within last 60 days : no.      This patient is on dialysis :no    List of available Home Health agencies were provided and reviewed with the patient prior to discharge.   Freedom of choice signed: yes, for [facility].   Currently, the discharge plan is Home with Home Health.    The patient states that she can obtain her medications from the pharmacy, and take her medications as directed.    Patient's current insurance is [facility]       Care  Management Interventions  PCP Verified by CM: Yes  Palliative Care Criteria Met (RRAT>21 & CHF Dx)?: No  Mode of Transport at Discharge: Other (see comment) (family)  Transition of Care Consult (CM Consult): Discharge Planning  Physical Therapy Consult: Yes  Occupational Therapy Consult: Yes  Support Systems: Child(ren),Other Family Member(s)  Confirm Follow Up Transport: Family  The Patient and/or Patient Representative was Provided with a Choice of Provider and Agrees with the Discharge Plan?: Yes  Freedom of Choice List was Provided with Basic Dialogue that Supports [name] Individualized Plan of Care/Goals, Treatment Preferences and Shares the Quality Data Associated with the Providers?: Yes  Discharge Location  Patient Expects to be Discharged to:: Home with home health        Bobbette Brand, BSN RN  Care Management  Pager: [phone]

## 2020-10-30 ENCOUNTER — Telehealth

## 2020-10-30 ENCOUNTER — Emergency Department: Admit: 2020-10-30 | Payer: MEDICARE | Primary: Family Medicine

## 2020-10-30 ENCOUNTER — Inpatient Hospital Stay
Admit: 2020-10-30 | Discharge: 2020-10-30 | Disposition: A | Discharge status: Home / Self Care | Payer: MEDICARE | Attending: Emergency Medicine

## 2020-10-30 ENCOUNTER — Encounter: Admit: 2020-10-30 | Discharge: 2020-10-30 | Payer: MEDICARE | Primary: Family Medicine

## 2020-10-30 DIAGNOSIS — G8918 Other acute postprocedural pain: Secondary | ICD-10-CM

## 2020-10-30 LAB — CBC WITH AUTO DIFFERENTIAL
Basophils %: 0 % (ref 0–2)
Basophils Absolute: 0 10*3/uL (ref 0.0–0.1)
Eosinophils %: 1 % (ref 0–5)
Eosinophils Absolute: 0.1 10*3/uL (ref 0.0–0.4)
Granulocyte Absolute Count: 0 10*3/uL (ref 0.00–0.04)
Hematocrit: 33.1 % — ABNORMAL LOW (ref 35.0–45.0)
Hemoglobin: 10.4 g/dL — ABNORMAL LOW (ref 12.0–16.0)
Immature Granulocytes: 0 % (ref 0.0–0.5)
Lymphocytes %: 10 % — ABNORMAL LOW (ref 21–52)
Lymphocytes Absolute: 1 10*3/uL (ref 0.9–3.6)
MCH: 27.7 PG (ref 24.0–34.0)
MCHC: 31.4 g/dL (ref 31.0–37.0)
MCV: 88 FL (ref 78.0–100.0)
MPV: 13.7 FL — ABNORMAL HIGH (ref 9.2–11.8)
Monocytes %: 8 % (ref 3–10)
Monocytes Absolute: 0.9 10*3/uL (ref 0.05–1.2)
NRBC Absolute: 0 10*3/uL (ref 0.00–0.01)
Neutrophils %: 81 % — ABNORMAL HIGH (ref 40–73)
Neutrophils Absolute: 8.2 10*3/uL — ABNORMAL HIGH (ref 1.8–8.0)
Nucleated RBCs: 0 PER 100 WBC
Platelets: 103 10*3/uL — ABNORMAL LOW (ref 135–420)
RBC: 3.76 M/uL — ABNORMAL LOW (ref 4.20–5.30)
RDW: 14.3 % (ref 11.6–14.5)
WBC: 10.1 10*3/uL (ref 4.6–13.2)

## 2020-10-30 LAB — COMPREHENSIVE METABOLIC PANEL
ALT: 19 U/L (ref 13–56)
AST: 23 U/L (ref 10–38)
Albumin/Globulin Ratio: 1.1 (ref 0.8–1.7)
Albumin: 3.7 g/dL (ref 3.4–5.0)
Alkaline Phosphatase: 69 U/L (ref 45–117)
Anion Gap: 6 mmol/L (ref 3.0–18)
BUN: 14 MG/DL (ref 7.0–18)
Bun/Cre Ratio: 21 — ABNORMAL HIGH (ref 12–20)
CO2: 25 mmol/L (ref 21–32)
Calcium: 8.9 MG/DL (ref 8.5–10.1)
Chloride: 111 mmol/L (ref 100–111)
Creatinine: 0.68 MG/DL (ref 0.6–1.3)
EGFR IF NonAfrican American: >60 mL/min/{1.73_m2} (ref >60)
GFR African American: >60 mL/min/{1.73_m2} (ref >60)
Globulin: 3.4 g/dL (ref 2.0–4.0)
Glucose: 130 mg/dL — ABNORMAL HIGH (ref 74–99)
Potassium: 3.9 mmol/L (ref 3.5–5.5)
Sodium: 142 mmol/L (ref 136–145)
Total Bilirubin: 0.8 MG/DL (ref 0.2–1.0)
Total Protein: 7.1 g/dL (ref 6.4–8.2)

## 2020-10-30 LAB — EKG 12-LEAD
Atrial Rate: 76 {beats}/min
Diagnosis: NORMAL
P Axis: 50 degrees
P-R Interval: 132 ms
Q-T Interval: 354 ms
QRS Duration: 80 ms
QTc Calculation (Bazett): 398 ms
R Axis: 13 degrees
T Axis: 72 degrees
Ventricular Rate: 76 {beats}/min

## 2020-10-30 LAB — METABOLIC PANEL, COMPREHENSIVE
A-G Ratio: 1.1 (ref 0.8–1.7)
ALT (SGPT): 19 U/L (ref 13–56)
AST (SGOT): 23 U/L (ref 10–38)
Albumin: 3.7 g/dL (ref 3.4–5.0)
Alk. phosphatase: 69 U/L (ref 45–117)
Anion gap: 6 mmol/L (ref 3.0–18)
BUN/Creatinine ratio: 21 — ABNORMAL HIGH (ref 12–20)
BUN: 14 MG/DL (ref 7.0–18)
Bilirubin, total: 0.8 MG/DL (ref 0.2–1.0)
CO2: 25 mmol/L (ref 21–32)
Calcium: 8.9 MG/DL (ref 8.5–10.1)
Chloride: 111 mmol/L (ref 100–111)
Creatinine: 0.68 MG/DL (ref 0.6–1.3)
GFR est AA: >60 mL/min/{1.73_m2} (ref >60)
GFR est non-AA: >60 mL/min/{1.73_m2} (ref >60)
Globulin: 3.4 g/dL (ref 2.0–4.0)
Glucose: 130 mg/dL — ABNORMAL HIGH (ref 74–99)
Potassium: 3.9 mmol/L (ref 3.5–5.5)
Protein, total: 7.1 g/dL (ref 6.4–8.2)
Sodium: 142 mmol/L (ref 136–145)

## 2020-10-30 LAB — EKG, 12 LEAD, INITIAL
Atrial Rate: 76 {beats}/min
Calculated P Axis: 50 degrees
Calculated R Axis: 13 degrees
Calculated T Axis: 72 degrees
Diagnosis: NORMAL
P-R Interval: 132 ms
Q-T Interval: 354 ms
QRS Duration: 80 ms
QTC Calculation (Bezet): 398 ms
Ventricular Rate: 76 {beats}/min

## 2020-10-30 LAB — CBC WITH AUTOMATED DIFF
ABS. BASOPHILS: 0 10*3/uL (ref 0.0–0.1)
ABS. EOSINOPHILS: 0.1 10*3/uL (ref 0.0–0.4)
ABS. IMM. GRANS.: 0 10*3/uL (ref 0.00–0.04)
ABS. LYMPHOCYTES: 1 10*3/uL (ref 0.9–3.6)
ABS. MONOCYTES: 0.9 10*3/uL (ref 0.05–1.2)
ABS. NEUTROPHILS: 8.2 10*3/uL — ABNORMAL HIGH (ref 1.8–8.0)
ABSOLUTE NRBC: 0 10*3/uL (ref 0.00–0.01)
BASOPHILS: 0 % (ref 0–2)
EOSINOPHILS: 1 % (ref 0–5)
HCT: 33.1 % — ABNORMAL LOW (ref 35.0–45.0)
HGB: 10.4 g/dL — ABNORMAL LOW (ref 12.0–16.0)
IMMATURE GRANULOCYTES: 0 % (ref 0.0–0.5)
LYMPHOCYTES: 10 % — ABNORMAL LOW (ref 21–52)
MCH: 27.7 PG (ref 24.0–34.0)
MCHC: 31.4 g/dL (ref 31.0–37.0)
MCV: 88 FL (ref 78.0–100.0)
MONOCYTES: 8 % (ref 3–10)
MPV: 13.7 FL — ABNORMAL HIGH (ref 9.2–11.8)
NEUTROPHILS: 81 % — ABNORMAL HIGH (ref 40–73)
NRBC: 0 PER 100 WBC
PLATELET: 103 10*3/uL — ABNORMAL LOW (ref 135–420)
RBC: 3.76 M/uL — ABNORMAL LOW (ref 4.20–5.30)
RDW: 14.3 % (ref 11.6–14.5)
WBC: 10.1 10*3/uL (ref 4.6–13.2)

## 2020-10-30 MED ORDER — HYDROCODONE-ACETAMINOPHEN 5 MG-325 MG TAB
5-325 mg | Freq: Once | ORAL | Status: AC
Start: 2020-10-30 — End: 2020-10-30
  Administered 2020-10-30: 11:00:00 via ORAL

## 2020-10-30 MED ORDER — OXYCODONE-ACETAMINOPHEN 10 MG-325 MG TAB
10-325 mg | ORAL_TABLET | Freq: Three times a day (TID) | ORAL | 0 refills | Status: DC | PRN
Start: 2020-10-30 — End: 2020-11-05

## 2020-10-30 MED FILL — HYDROCODONE-ACETAMINOPHEN 5 MG-325 MG TAB: 5-325 mg | ORAL | Qty: 1

## 2020-10-30 NOTE — ED Provider Notes (Signed)
Patient 70 year old female who is postop day 2 from total right knee replacement, hypertension, hyperlipidemia, and COPD.  Patient presents today with primary complaint of moderate to severe, sharp, nonradiating pain to his right knee that she states is interfering with her ability to rest.  Patient also reports associated dizziness that is only associated with when she takes her prescribed oxycodone 10 mg pain medication.  Patient reports no associated chest pain, difficulty breathing, Donnell pain, nausea/vomiting, fever/chills, or syncope.           Past Medical History:   Diagnosis Date   ??? Arthritis     knee, arms,    ??? Asthma     resolved after gastric bypass   ??? Chronic obstructive pulmonary disease (HCC)     no meds, since gastric bypass   ??? Chronic pain    ??? Diabetes (HCC)     no meds   ??? H/O sinusitis    ??? Hx SBO     multiple surgeries prior to 2002   ??? Hypercholesterolemia    ??? Hypertension    ??? Sleep apnea     NOT USING    ??? Stress incontinence    ??? Thrombocytopenia (HCC)    ??? Toe fracture, left August 2015    4th toe       Past Surgical History:   Procedure Laterality Date   ??? HX CARPAL TUNNEL RELEASE Bilateral    ??? HX CHOLECYSTECTOMY     ??? HX GYN     ??? HX HERNIA REPAIR      abdomen   ??? HX HYSTERECTOMY      tubes tied   ??? 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 HX ORTHOPAEDIC      catrpal tunnel right and left   ??? HX POLYPECTOMY  04/18/14   ??? PR ABDOMEN SURGERY PROC UNLISTED      4 surgeries for blockages         Family History:   Problem Relation Age of Onset   ??? Hypertension Mother    ??? Stroke Mother    ??? Diabetes Mother    ??? Thyroid Disease Mother    ??? Arthritis-rheumatoid Mother    ??? Cancer Father         colon polpe   ??? Cancer Maternal Aunt        Social History     Socioeconomic History   ??? Marital status: SINGLE     Spouse name: Not on file   ??? Number of children: Not on file   ??? Years of education: Not on file   ??? Highest education level: Not on file   Occupational History   ??? Not on file   Tobacco  Use   ??? Smoking status: Never Smoker   ??? Smokeless tobacco: Never Used   Substance and Sexual Activity   ??? Alcohol use: No     Alcohol/week: 0.0 standard drinks   ??? Drug use: No   ??? Sexual activity: Not Currently   Other Topics Concern   ??? 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 Denies history of smoking      Social Determinants of Health     Financial Resource Strain:    ??? Difficulty of Paying Living Expenses: Not on file   Food Insecurity:    ??? Worried About Running Out of Food in the Last Year: Not on file   ???  Ran Out of Food in the Last Year: Not on file   Transportation Needs:    ??? Lack of Transportation (Medical): Not on file   ??? Lack of Transportation (Non-Medical): Not on file   Physical Activity:    ??? Days of Exercise per Week: Not on file   ??? Minutes of Exercise per Session: Not on file   Stress:    ??? Feeling of Stress : Not on file   Social Connections:    ??? Frequency of Communication with Friends and Family: Not on file   ??? Frequency of Social Gatherings with Friends and Family: Not on file   ??? Attends Religious Services: Not on file   ??? Active Member of Clubs or Organizations: Not on file   ??? Attends Banker Meetings: Not on file   ??? Marital Status: Not on file   Intimate Partner Violence:    ??? Fear of Current or Ex-Partner: Not on file   ??? Emotionally Abused: Not on file   ??? Physically Abused: Not on file   ??? Sexually Abused: Not on file   Housing Stability:    ??? Unable to Pay for Housing in the Last Year: Not on file   ??? Number of Places Lived in the Last Year: Not on file   ??? Unstable Housing in the Last Year: Not on file         ALLERGIES: Latex; Penicillins; Acetaminophen-codeine; Butrans [buprenorphine]; Codeine; Iodine; Lactose; and Other plant, animal, environmental    Review of Systems   Constitutional: Negative for chills and fever.   HENT: Negative for rhinorrhea and sore throat.    Eyes: Negative for discharge  and redness.   Respiratory: Negative for cough and shortness of breath.    Cardiovascular: Negative for chest pain and leg swelling.   Gastrointestinal: Negative for abdominal pain, diarrhea, nausea and vomiting.   Genitourinary: Negative for difficulty urinating and dysuria.   Musculoskeletal: Positive for arthralgias. Negative for back pain and neck pain.   Skin: Negative for rash and wound.   Neurological: Positive for dizziness and light-headedness. Negative for syncope and headaches.       Vitals:    10/30/20 0151   BP: (!) 133/56   Pulse: 77   Resp: 16   Temp: 98 ??F (36.7 ??C)   SpO2: 97%   Weight: 90.7 kg (200 lb)            Physical Exam  Constitutional:       General: She is not in acute distress.     Appearance: She is not ill-appearing, toxic-appearing or diaphoretic.   HENT:      Head: Normocephalic and atraumatic.      Right Ear: External ear normal.      Left Ear: External ear normal.      Nose: No congestion or rhinorrhea.      Mouth/Throat:      Mouth: Mucous membranes are moist.      Pharynx: No oropharyngeal exudate or posterior oropharyngeal erythema.   Eyes:      General:         Right eye: No discharge.         Left eye: No discharge.      Pupils: Pupils are equal, round, and reactive to light.   Neck:      Vascular: No carotid bruit.   Cardiovascular:      Rate and Rhythm: Normal rate and regular rhythm.  Heart sounds: No murmur heard.  No friction rub. No gallop.    Pulmonary:      Effort: Pulmonary effort is normal. No respiratory distress.      Breath sounds: No stridor. No wheezing, rhonchi or rales.   Abdominal:      General: Abdomen is flat. There is no distension.      Tenderness: There is no abdominal tenderness. There is no right CVA tenderness, left CVA tenderness, guarding or rebound.   Musculoskeletal:         General: Tenderness present. No swelling, deformity or signs of injury.      Cervical back: No rigidity or tenderness.      Right lower leg: No edema.      Left lower leg:  No edema.      Comments: Demonstrates mild tenderness over previous incision site with no evidence of active bleeding, fluctuance, or induration.   Lymphadenopathy:      Cervical: No cervical adenopathy.   Skin:     General: Skin is warm.      Capillary Refill: Capillary refill takes less than 2 seconds.      Coloration: Skin is not jaundiced or pale.      Findings: No bruising, erythema, lesion or rash.   Neurological:      General: No focal deficit present.      Mental Status: She is alert and oriented to person, place, and time.      Sensory: No sensory deficit.      Motor: No weakness.      Comments: Demonstrates +5 strength all extremities with no reported sensory deficits   Psychiatric:         Mood and Affect: Mood normal.          MDM  Number of Diagnoses or Management Options  Lightheadedness: new and requires workup  Postoperative pain: new and requires workup  Diagnosis management comments: Patient presents with primary complaint of pain at recent surgical site, patient overall appears well with no evidence of infection on physical exam or laboratory studies and x-ray is similarly reassuring with no evidence of acute abnormality to suggest a postoperative complication.  Patient's lightheadedness/dizziness is consistently temporally related to her pain medication use and given that her laboratory studies, EKG, chest x-ray are all reassuring this is unlikely to represent a life-threatening abnormality to include arrhythmia, electrolyte abnormality, metabolic derangement, anemia and is most likely secondary to her pain medication use.  This was discussed in detail with the patient and she was ultimately discharged in good condition with recommendation for close follow-up with both her orthopedic surgeon and her primary care doctor.       Amount and/or Complexity of Data Reviewed  Clinical lab tests: reviewed  Tests in the radiology section of CPT??: reviewed  Tests in the medicine section of CPT??:  reviewed           Procedures

## 2020-10-30 NOTE — ED Notes (Signed)
Pt d/cd to home awake, alert and in NAD.  All questions answered.

## 2020-10-30 NOTE — Telephone Encounter (Signed)
Patient called in stating she had a knee replacement on 06/13 with Dr Meyer Cory and has been in a lot of pain. Patient was seen at the ER this morning with excessive pain and inability to sleep. She stated she was still in pain and the ER recommended she make an appointment with Dr.     Patient advise callback   Phn# [phone]

## 2020-10-30 NOTE — ED Notes (Signed)
Pt with left knee replacement on 10/28/20 c/o extreme pain to left knee and inability to sleep.

## 2020-10-30 NOTE — Home Health (Signed)
S: patient reports that she went to the ER in the middle of the night as her pain was so bad- she did not get her percocet from the pharmacy- patient called the pharmacy while PT was there and a prescription for percocet was never called - patient called Dr Madelin Headings office to request the prescription be sent it as she has not pain meds at this time - she was waiting for a call back   O: PAIN: patient's pain is out of control due to lack of pain medication - current 9/10 with high of 10/10  patient to apply ice to the left knee for pain and swelling control per MD protocol using a barrier to protect the skin. Patient to monitor the skin for any adverse effects such as color changes, irritation, mottled appearance. Patient to use ice for 20 minutes an hour for the first 2-3 days and then can reduce to 20 minutes 4-5 times a day.  WOUND:L knee covered in the aquacel bandage  - wound not visible. the bandage was about 40% full of old drainage - did not appear to be actively bleeding.  surrouding the bandage there is moderate swelling without redness. per MD orders did not remove the bandage but educated patient on signs of infection and to NOT remove the bandage   the bandage was peeling up at the proximal end and PT secured it with medical tape   patient was educated on signs of infection  ROM: L knee extension -8 in supine quad set  L knee flexion 48 degrees in supine heel slide  ROM measurements may not be accurate due to significant pain without pain medication  STRENGTH: R knee ext 4+/5, R knee flexion 4+/5, R hip flex 4/5, R hip abd/adduction 4/5  L knee flexion and extension 2-/5, L hip flexion 3-/5 abduction and adduction 3-/5  BED MOBILITY: patient requires min to moderate assist to lift the L LE in and out of the bed due to weakness and pain- also needs min to mod A to position the LE in the bed in elevation.   EQUIPMENT: bedside commode, FWW  TRANSFERS: patient completed sit to stand from the bed and the  toilet with exaggerated BOS, maintenance of the L LE extended out in front of her, full weight shift to the R LE to completely unweight the L LE. Requires use of walker for initial stance for balance, safety and to unweight the R LE.   GAIT: ambulating with FWW with minimal weight bearing through the L LE (max weight through UE on the walker), step to patternwith  reduced L knee flexion in swing and reducec terminal knee extension in initial contact, poor R LE foot clearance and short R step length  she walked for a total of 50 feet in the home with SBA and cues for gait correction  there is a small ramp to go from the living room (where her bed is) to the rest of the house/bathroom   BALANCE: tinetti 9/28 high fall risk   RESPONSE TO TREATMENT:patient demonstrated a positive outcome post treatment and reported a reduction in pain, increased comfort and increased confidence with transfers and mobility. Patient reported good understanding of the HEP and reports confidence in ability to complete the program as prescribed by PT  RESPONSE TO EDUCATION: patient was educated on: safety, fall risk, signs of infection - patient expressed understanding   Patient expressed understanding of all education provided and was able to repeat back education,  precautions, and HEP.  A:ASSESSMENT AND PROGRESS TOWARD GOALS:  Patient demonstrated a positive result to therapy this date as evidenced by an improvement in gait pattern with cues, decreased pain with exercise and walking and patient expressing an understanding of the rehab plan due to therapy verbal and written instructions. Goals established for increased independence in the home, safe mobility in the home, improvement in strength and ROM - all designed to reduce fall risk and progress toward independence.  Patient will benefit from continued PT intervention to progress toward meeting all established goals    P:.continue with progression of mobility, ther ex for strength, ROM,  provide education to patient and caregivers to maximize the recovery and reduce the fall risk to progress toward a return to independent function    Skilled services/Home bound verification: MD referral for HHPT following recent hospital stay. HHPT is medically necessary to address  dx and clinical findings: decreased ROM, decreased strength, impaired gait, decreased ability w stair negotiation, increased swelling, decreased bed mobility, decreased transfer status, decreased endurance, decreased balance and decreased safety, increased pain in order to improve functional mobility/quality of life, decrease burden of care, reduce risk for re-hospitalization, work towards patient's personal goals of return to PLOF w decrease risk for falls.    Skilled Reason for admission/summary of clinical condition:  s/p L TKR by Dr Luther Redo    Caregiver: patient lives in a one story apartment with 4 steps to enter and egress the front door - she lives with her daughter and granddaughter and they are her main caregivers and assist with meals, medications, ADLs, mobility and transportation     Medications reconciled and all medications are available in the home this visit.  The following education was provided regarding medications, medication interactions, and look a like medications. Meds are effective at this time.  the following discrepancies were noted- patient did not yet have the percocet as it was not called into the pharmacy  patient was educated on the risks and side effects of the percocet (once she gets the prescription) and the need to follow the prescription as written by the MD  Dr Littie Deeds office was notified that the paitnet did not have the percocet - they are supposed to call the patient back once it gets called in    Home health supplies ordered/delivered this visit include: mepilex x 2    Patient education provided: safety, fall risk, HEP.  Patient/caregiver degree of understanding:good    Home exercise program:  patient educated on and demonstrated good technqiue with verbal cues of the following exercises: supine ankle pumps, quad sets, heel slides, SLR  sitting LAQ and knee flexion under the chair   educated on need to complete all exercises 3 times a day for 15 reps each. patient is to walk each hour during the day and is to ice at least 4 times a day for edema control.    Pt/Caregiver instructed on plan of care and are agreeable to plan of care      Plan of care called to attending MD: Dr Luther Redo    A written copy of the [facility] of Rights was given and verbally reviewed on this visit.  . The patient or representative was given the opportunity to ask pertinent questions regarding the bill of rights.  [employer] [employer] of rights information was received by patient and her daughter    Discharge planning discussed with patient and caregiver. DC to self and family under MD supervision when all  goals are met or maximum potential is reached.  Pt/Caregiver did verbalize understanding of DC planning.     Next MD appointment 11/14/20 with Dr Luther Redo .  Patient/caregiver instructed to keep appointment as lack of follow through with MD appointment could result in discontinuation of home care services for non-compliance.

## 2020-10-30 NOTE — Telephone Encounter (Signed)
Patient's granddaughter called and stated that she's in a lot of pain and was told that the medication (Percocet) was called in to the pharmacy but it still isn't there. Please advise. Patient can be reached at [phone].

## 2020-10-30 NOTE — Telephone Encounter (Signed)
rx resent to pharmacy

## 2020-10-30 NOTE — Home Health (Signed)
Skilled reason for visit: SNE LT TOTAL KNEE    Caregiver involvement: FAMILY COOK, CLEANS AND TAKES PATIENT TO MD APPT.Marland Kitchen    Medications reviewed and all medications are available in the home this visit.    The following education was provided regarding medications:  MEDICATIONS RECONCILED WITH PATIENT..    MD notified of any discrepancies/look a-like medications/medication interactions: NA  Medications are EFFECTIVE at this time.      Home health supplies by type and quantity ordered/delivered this visit include:NA    Patient education provided this visit: TAUGHT S/S OF INFECTION AT LT [place]. INSTRUCTED ON MEDICATIONS AND DIETARY COMPLIANCY. INSTRUCTED ON WHEN TO NOTIFY MD OF MEDICAL CHANGES R/T LT KNEE SURGICAL SITE.    [place] education provided: NA    Patient level of understanding of education provided: PATIENT VERBALIZED [place].    Skilled Care Performed this visit: ASSESSMENT OF LT KNEE , DSG INTACT, DSG CHANGE DUE 11/06/20.    Patient response to procedure performed:  STATES HAVING PAIN DUE TO WAITING ON PAIN MEDS, FAMILY GONE TO PICK UP PAIN MEDS AT THIS TIME.        Patient's Progress towards personal goals: MANAGING SURGERY WELL EVEN WITH PAIN.    Home exercise program:BREATHING EXERCISES TO HELP PREVENT PNEUMONIA.    Continued need for the following skills: Nursing and Physical Therapy    Plan for next visit: ASSESSMENT AND DSG CHANGE TO LT KNEE.    Patient and/or caregiver notified and agrees to changes in the Plan of Care YES      The following discharge planning was discussed with the pt/caregiver: PATIENT WILL BE DISCHARGED ONCE ALL GOALS HAVE BEEN MET AND MEDICALLY STABLE.

## 2020-10-30 NOTE — Progress Notes (Signed)
Care Transitions Initial Call    Call within 2 business days of discharge: Yes     Patient: Cheryl Paul Patient DOB: December 18, 1950 MRN: [id number]    Last Discharge [facility] Facility       Complaint Diagnosis Description Type Department Provider    10/30/20 Post-Op Problem Postoperative pain ... ED (DISCHARGE) [facility] Kinzer, Brynda Peon, MD          Was this an external facility discharge? No Discharge Facility: N/A    Challenges to be reviewed by the provider   Additional needs identified to be addressed with provider: no  none         Method of communication with provider : chart routing    Discussed COVID-19 related testing which was not done at this time.   Test results were not done.   Patient informed of results, if available? N/A     Advance Care Planning:   Does patient have an Advance Directive: yes, reviewed and current.     Inpatient Readmission Risk score: No data recorded  Was this a readmission? no   Patient stated reason for the admission: N/A     Care Transition Nurse (CTN) contacted the patient by telephone to perform post hospital discharge assessment. No answer. Left HIPPA compliant message. Name, role and contact information provided. Requested return call.  Will attempt to contact at a later time.     Contacted Alfredia Client, daughter (listed on PHI). Daughter verbalized she is at work but patient is home after returning from ED this morning. Daughter verbalized patient received all new medications with exception of Percocet. As per daughter, she gave patient Celebrex last night but this did not help with pain so patient drove herself to the ED. Daughter verbalized she will return call to CTN when she gets home from work this afternoon.

## 2020-10-30 NOTE — Telephone Encounter (Signed)
I spoke with the pharmacy and they did not receive the RX for Percocet.   I am routing this to JR so he can resend it.  [facility] [address].

## 2020-10-31 ENCOUNTER — Encounter: Admit: 2020-10-31 | Discharge: 2020-10-31 | Payer: MEDICARE | Primary: Family Medicine

## 2020-10-31 NOTE — Progress Notes (Signed)
Repeat BMP order placed for ER ff-up hyperkalemia, pls verify current med doses as well, esp K replacement

## 2020-10-31 NOTE — Home Health (Signed)
SUBJECTIVE: Patients L knee pain is presently at 1/10. Patient was resting in bed at arrival and declined any recent falls or medication changes.    CAREGIVER INVOLVEMENT/ASSISTANCE NEEDED FOR: Patient lives in a one story home with her family who is assisting with all ADLs and meal preparations as needed. Patient has steps present to enter/exit home.    OBJECTIVE:  See interventions.    PATIENT RESPONSE TO TREATMENT: Patients pain increased with treatment session to 3/10 but following decreased to 2/10. Rest breaks are required throughout.     PATIENT LEVEL OF UNDERSTANDING OF EDUCATION PROVIDED:  Educated patient on need to use AD during all standing and gait tasks until MD states otherwise to provide stability and balance while decreasing patients fall risk. Patient was instructed on importance of keeping L knee extended throughout the day to increase knee extension ROM. Patient was also educated on importance of elevating L LE above heart level to decrease edema while keeping knee in an extended position for at least 30-45 minutes, 3-5x per day if able.    ASSESSMENT OF PROGRESS TOWARD GOALS: Patient was seen today for a PT follow up and was able to perform initial therapy tasks with increased pain to 3/10 but does decrease following treatment. Patient was educated on proper positioning to aide with increasing L knee extension and also to decrease edema. Patient required MIN A to aide with LEs onto and off of bed. Patient also performed gait training indoors with use of FWW- she has an antalgic gait pattern and requires education to increase heel strike and L knee extension.     [place] was educated to take a walk at least once per hour to increase time spent in standing. Patient was also educated on supine: quad sets for 5 second holds, heel slides, ankle pumps all x10 reps each, 3-5x per day as able.     THE FOLLOWING DISCHARGE PLANNING WAS DISCUSSED WITH THE PATIENT/CAREGIVER: Plan to DC  patient from home health PT once PT goals are met.     PLAN FOR NEXT VISIT: Plan next visit to take ROM measure and increase therex tasks as patient is able.

## 2020-10-31 NOTE — Progress Notes (Signed)
Left message for patient to return my call.

## 2020-10-31 NOTE — Progress Notes (Signed)
Care Transitions Initial Call    Call within 2 business days of discharge: Yes     Patient: Cheryl Paul Patient DOB: 1950/06/17 MRN: [id number]    Last Discharge [facility] Facility       Complaint Diagnosis Description Type Department Provider    10/30/20 Post-Op Problem Postoperative pain ... ED (DISCHARGE) [facility] Kinzer, Brynda Peon, MD          Was this an external facility discharge? No Discharge Facility: N/A    Challenges to be reviewed by the provider   Additional needs identified to be addressed with provider: no  none         Method of communication with provider : chart routing    Discussed COVID-19 related testing which was not done at this time.   Test results were not done.   Patient informed of results, if available? N/A     Advance Care Planning:   Does patient have an Advance Directive: yes, reviewed and current.     Inpatient Readmission Risk score: No data recorded  Was this a readmission? no   Patient stated reason for the admission: N/A    Patients top risk factors for readmission: medical condition-left knee arthroplasty   Interventions to address risk factors: Obtained and reviewed discharge summary and/or continuity of care documents, Education of patient/family/caregiver/guardian to support self-management-Educated patient on red flags of infection to monitor for and report. Patient reported dressing is dry and intact with areas of dried blood. Denied active bleeding. Patient has walker, BSC and shower chair at home.  and Assessment and support for treatment adherence and medication management-Patient reported 0/10 left knee pain. Received Percocet yesterday evening and slept well.     Care Transition Nurse (CTN) contacted the patient by telephone to perform post hospital discharge assessment. Verified name and DOB with patient as identifiers. Provided introduction to self, and explanation of the CTN role.     CTN reviewed discharge instructions, medical action plan and red flags with patient who  verbalized understanding. Were discharge instructions available to patient? yes. Reviewed appropriate site of care based on symptoms and resources available to patient including: PCP, Specialist, Home Health and CTN. Patient given an opportunity to ask questions and does not have any further questions or concerns at this time. The patient agrees to contact the PCP office for questions related to their healthcare.     Medication reconciliation was performed with patient, who verbalizes understanding of administration of home medications. Advised obtaining a 90-day supply of all daily and as-needed medications.   Referral to Pharm D needed: no     Home Health/Outpatient orders at discharge: PT and SN  Home Health company: [facility]  Date of initial visit: 10/30/20    Durable Medical Equipment ordered at discharge: None      Was patient discharged with a pulse oximeter? no    Discussed follow-up appointments. If no appointment was previously scheduled, appointment scheduling offered: no. Is follow up appointment scheduled within 7 days of discharge? yes.   [facility] follow up appointment(s):   Future Appointments   Date Time Provider Department Center   10/31/2020  2:30 PM Ernestina Penna [facility] HR [facility]   11/01/2020 To Be Determined Ernestina Penna [facility] HR [facility]   11/02/2020 To Be Determined Noni Saupe, PT [facility] HR [facility]   11/03/2020 To Be Determined Noni Saupe, PT [facility] HR [facility]   11/06/2020  1:30 PM Zella Ball, MD  [facility]   11/14/2020 10:50 AM Burtis Junes R, PA-C [facility]   01/06/2021 10:30 AM Pascual, Hanley Seamen, MD [facility]     Non-[facility] follow up appointment(s): none    Plan for follow-up call in 7-10 days based on severity of symptoms and risk factors.  Plan for next call: symptom management-assess pain and s/s of infection, self management-icing and taking newly prescribed meds as instructed and follow up appointment-attendance of TOC appt   CTN provided contact information for  future needs.    Goals Addressed                 This Visit's Progress    . Attends follow-up appointments as directed.        Goal: Patient will attend all appointments scheduled within the next 30 days.      Ensure provider appt is scheduled within 7 business days post-discharge; 6/15: TOC appt 7 days post d/c; 6/22 @ 1:30PM. Ortho appt 6/30 @ 1050 AM. Patient aware.  Confirm patient attended post-discharge provider appt   Complete post-visit call to confirm attendance and update care needs          . Prevent complications post hospitalization.        Goal: No admissions post 30 days from discharge of 10/29/20.      Review/educate common or potential "red flags" of condition worsening; 6/16: Reviewed/educated patient on red flags of infection to monitor for and report: increased or worsening pain, redness, warmth at site(s), swelling, bleeding or pus-like drainage, chills, fever (> 100.5), nausea/vomiting  Evaluate adherence to medications and priority barriers to resolve;6/15: Daughter voiced patient received all new meds with exception of Percocet. 6/16: Patient reported she received Percocet yesterday evening.       Instruct on adherence to medications as ordered and assess for therapeutic response and side-effects; 6/16: Patient reported  0/10 left knee pain this AM. Taking Percocet 7.5-10 mg which is providing adequate pain relief.            Review the Home Visit or Home Health documentation for coordinating care and goals; 6/16: Patient active with [facility] (SN/PT). [facility] 6/15.      Observe for trends in symptoms and measures, provide direction to patient, and notify provider as needed

## 2020-10-31 NOTE — Telephone Encounter (Signed)
Call placed to patient, ID verified x 2. Patient is s/p left total knee replacement with Dr. Meyer Cory, [facility] 10/28/2020. Patient denies chest pain, shortness of breath, nausea, vomiting, fever, chills or calf pain. Patient states that her pain is now well controlled as long as she takes her pain medication scheduled as prescribed. Per patient she had to return to the ED for postop pain because the pharmacy never received her pain medication order. She states that she has her medication now and that she is icing to assist with pain and swelling. She states that home health and home physical therapy have been out to her house. Her dressing is reported with some drainage, but intact and dry. She reports walking hourly with her walker and doing the exercises that she was instructed to perform by physical therapy. Overall she is doing well. She is just happy that she finally has some pain relief. She will follow up in two weeks postop with Dr. Meyer Cory. She has no questions at this time.

## 2020-11-01 ENCOUNTER — Encounter: Primary: Family Medicine

## 2020-11-02 ENCOUNTER — Encounter: Admit: 2020-11-02 | Discharge: 2020-11-02 | Payer: MEDICARE | Primary: Family Medicine

## 2020-11-02 NOTE — Home Health (Signed)
SUBJECTIVE: Pt reports she is very concerned about all the drainage she is having on her knee.  CAREGIVER INVOLVEMENT/ASSISTANCE NEEDED FOR: Pts   HOME HEALTH SUPPLIES BY TYPE AND QUANTITY ORDERED/DELIVERED THIS VISIT INCLUDE: n/a  OBJECTIVE:  See interventions.  1.Patient level of understanding of education provided: Pt demonstrates a fair understanding of HEP requiring cuing for proper positioning.  Pt also requires cuing to decrease pace to focus on proper technique with all exercises.  2.Patient response to treatment: Pt continues to have very limited L knee flex and was very guarded with attempts.  Pt amb in home with moderate difficulty and cuing to safely manuver around/over obstacles.  ASSESSMENT OF PROGRESS TOWARD GOALS: L knee dressing was 80% saturated to borders and has been resecured with bandaid by pt when LPTA arrived.  Changed dressing and no active drainage noted and incision intact.  CONTINUED NEED FOR THE FOLLOWING SKILLS: Pt requires continued PT to improve LE strength/gait ability  PLAN FOR NEXT VISIT: Will plan to continue PT working towards improving L knee ROM, bed mobility, transfers and gait ability.   THE FOLLOWING DISCHARGE PLANNING WAS DISCUSSED WITH THE PATIENT/CAREGIVER: Pt is scheduled daily through 11/12/20 for [facility] PT.

## 2020-11-03 ENCOUNTER — Encounter: Admit: 2020-11-03 | Discharge: 2020-11-03 | Payer: MEDICARE | Primary: Family Medicine

## 2020-11-04 ENCOUNTER — Encounter: Admit: 2020-11-04 | Discharge: 2020-11-04 | Payer: MEDICARE | Primary: Family Medicine

## 2020-11-04 NOTE — Home Health (Signed)
SUBJECTIVE: Patients L knee pain is presently at 5/10. Patient was resting in bed at arrival and declined any recent falls or medication changes.    CAREGIVER INVOLVEMENT/ASSISTANCE NEEDED FOR: Patient lives in a one story home with her family who is assisting with all ADLs and meal preparations as needed. Patient has steps present to enter/exit home.    OBJECTIVE:  See interventions.    PATIENT RESPONSE TO TREATMENT: Patients pain increased with treatment session to 3/10 but following decreased to 2/10. Rest breaks are required throughout.     PATIENT LEVEL OF UNDERSTANDING OF EDUCATION PROVIDED:  Educated patient on need to use AD during all standing and gait tasks until MD states otherwise to provide stability and balance while decreasing patients fall risk. Patient was instructed on importance of keeping L knee extended throughout the day to increase knee extension ROM. Patient was also educated on importance of elevating L LE above heart level to decrease edema while keeping knee in an extended position for at least 30-45 minutes, 3-5x per day if able.    ASSESSMENT OF PROGRESS TOWARD GOALS: Patient was seen today for a PT follow up and was able to perform an increase in gait distance indoors with use of FWW- patient required education to increase heel strike and step length to aide with normalizing gait pattern. Patient then performed an increase in sit to stands with BUE support and SBA from kitchen surfaces. During supine exercises therapist encouraged patient to increase compliance with tasks. She reports understanding. Updated HEP as below.     Petros was educated to take a walk at least once per hour to increase time spent in standing. Patient was also educated on supine: quad sets for 5 second holds, heel slides, ankle pumps, in sitting: LAQ, knee flexion, hip marching all x10-15 reps each, 3-5x per day as able.     THE FOLLOWING DISCHARGE PLANNING WAS DISCUSSED WITH THE  PATIENT/CAREGIVER: Plan to DC patient from home health PT once PT goals are met.     PLAN FOR NEXT VISIT: Plan next visit to take ROM measure and increase therex tasks as patient is able.

## 2020-11-05 ENCOUNTER — Telehealth

## 2020-11-05 ENCOUNTER — Encounter: Admit: 2020-11-05 | Discharge: 2020-11-05 | Payer: MEDICARE | Primary: Family Medicine

## 2020-11-05 ENCOUNTER — Encounter: Primary: Family Medicine

## 2020-11-05 MED ORDER — OXYCODONE-ACETAMINOPHEN 10 MG-325 MG TAB
10-325 mg | ORAL_TABLET | Freq: Three times a day (TID) | ORAL | 0 refills | Status: DC | PRN
Start: 2020-11-05 — End: 2020-11-13

## 2020-11-05 NOTE — Telephone Encounter (Signed)
rx done and sent to pharmacy

## 2020-11-05 NOTE — Telephone Encounter (Signed)
Pt informed that prescription was sent to the pharmacy at this time.

## 2020-11-05 NOTE — Telephone Encounter (Signed)
Patient is requesting a refill of Oxycodone. Patient states she uses [facility] on [address].    Patient can be reached at [phone].

## 2020-11-05 NOTE — Telephone Encounter (Signed)
 Last Visit: 10/28/20 post-op with MD Marlow  Next Appointment: 11/14/20 with PA Cleotilde  Previous Refill Encounter(s): 10/30/20 #42    Requested Prescriptions     Pending Prescriptions Disp Refills   . oxyCODONE-acetaminophen (Percocet) 10-325 mg per tablet 42 Tablet 0     Sig: Take 1-2 Tablets by mouth every eight (8) hours as needed for Pain for up to 7 days. Max Daily Amount: 6 Tablets.         For Pharmacy Admin Tracking Only    . CPA in place:   . Recommendation Provided To:   SABRA Intervention Detail: New Rx: 1, reason: ADE and Patient Preference  . Gap Closed?:   . Intervention Accepted By:   . Time Spent (min): 5

## 2020-11-05 NOTE — Home Health (Signed)
Skilled reason for visit: SNE LT TOTAL KNEE    Caregiver involvement: FAMILY COOK, CLEANS AND TAKES PATIENT TO MD APPT.Marland Kitchen    Medications reviewed and all medications are available in the home this visit.    The following education was provided regarding medications:  MEDICATIONS RECONCILED WITH PATIENT..    MD notified of any discrepancies/look a-like medications/medication interactions: NA  Medications are EFFECTIVE at this time.      Home health supplies by type and quantity ordered/delivered this visit include:NA    Patient education provided this visit: TAUGHT S/S OF INFECTION AT LT KNEE SURGICAL SITE. INSTRUCTED ON MEDICATIONS AND DIETARY COMPLIANCY. INSTRUCTED ON WHEN TO NOTIFY MD OF MEDICAL CHANGES R/T LT KNEE SURGICAL SITE.    Sharps education provided: NA    Patient level of understanding of education provided: PATIENT VERBALIZED UNDERSTANDING OF  EDUCATION TAUGHT TODAY.    Skilled Care Performed this visit: ASSESSMENT OF LT KNEE , DSG INTACT, DSG CHANGED ON SATURDAY.  Patient response to procedure performed:  STATES HAVING PAIN  IN LT KNEE SURGICAL SITE, STATES GOING TO NEED MORE PAIN  MEDS,       Patient's Progress towards personal goals: MANAGING SURGERY WELL EVEN WITH PAIN.    Home exercise program:BREATHING EXERCISES TO HELP PREVENT PNEUMONIA.    Continued need for the following skills: Nursing and Physical Therapy    Plan for next visit: ASSESSMENT  OF LT KNEE SURGICAL SITE, TEACHING ON DISEASE PROCESS.    Patient and/or caregiver notified and agrees to changes in the Plan of Care YES      The following discharge planning was discussed with the pt/caregiver: PATIENT WILL BE DISCHARGED ONCE ALL GOALS HAVE BEEN MET AND MEDICALLY STABLE.

## 2020-11-05 NOTE — Home Health (Signed)
SUBJECTIVE: Pt reports she cannot flex her L knee anymore than what she is already doing.  CAREGIVER INVOLVEMENT/ASSISTANCE NEEDED FOR: Pts   HOME HEALTH SUPPLIES BY TYPE AND QUANTITY ORDERED/DELIVERED THIS VISIT INCLUDE: n/a  OBJECTIVE:  See interventions.  1.Patient level of understanding of education provided: Pt demonstrates a fair understanding of HEP requiring cuing for proper positioning.  Pt also requires cuing to decrease pace to focus on proper technique with all exercises.  2.Patient response to treatment: Pt continues to have very limited L knee flex and was very guarded with attempts and all activities.  Pt amb in home with moderate difficulty and cuing to safely manuver around/over obstacles.  ASSESSMENT OF PROGRESS TOWARD GOALS:No drainage noted on dressing today and intact.  Pt continues to be very guarded with L knee flex and holds LLE very rigid with transfer/amb.  Pt is very resistive with L knee flex.  CONTINUED NEED FOR THE FOLLOWING SKILLS: Pt requires continued PT to improve LE strength/gait ability.  PLAN FOR NEXT VISIT: Will plan to continue PT working towards improving L knee ROM, bed mobility, transfers and gait ability.   THE FOLLOWING DISCHARGE PLANNING WAS DISCUSSED WITH THE PATIENT/CAREGIVER: Pt is scheduled daily through 11/12/20 for [facility] PT.

## 2020-11-05 NOTE — Home Health (Signed)
 S: patient reports that she is mostly in the bed discussed need to be up walking hourly, varying position of the knee (walking, sitting short periods of time, supine with elevation) and the need to be very compliant with each repetition of every excerise to improve her ROM, gait and independence     O: PAIN: pain 4/8 at start of session increasing to 9/10 with therapeutic exercise and stretcing  patient to apply ice to the left knee for pain and swelling control per MD protocol using a barrier to protect the skin. Patient to monitor the skin for any adverse effects such as color changes, irritation, mottled appearance. Patient to use ice for 20 minutes 4-5 times a day.    WOUND:L knee covered in the MEPILEX bandage  - wound not visible. no drainage -  surrouding the bandage there is moderate swelling without redness- some bruising noted along the medial side-. per MD orders did not remove the bandage but educated patient on signs of infection   patient was educated on signs of infection    ROM: L knee extension -8 in supine quad set AT EVALUATION  L knee flexion 48 degrees in supine heel slide AT EVALUATION  L knee extension 0 degrees in supine quad set  L knee flexion 60 degrees active in supine heel slids  L knee flexion in supine with overpressure with patient using a towel 64 degrees  L kbee extensor lag -34 degrees in seated LAQ    STRENGTH: R knee ext 4+/5, R knee flexion 4+/5, R hip flex 4/5, R hip abd/adduction 4/5  L knee flexion and extension 3-/5, L hip flexion 3-/5 abduction and adduction 3-/5    BED MOBILITY: patient is independent with bed mobility    EQUIPMENT: bedside commode, FWW    TRANSFERS: patient completed sit to stand from the bed, chair and the toilet with exaggerated BOS, maintenance of the L LE extended out in front of her, full weight shift to the R LE to completely unweight the L LE. Requires use of walker for initial stance for balance, safety and to unweight the R LE.- she was  encouraged to flex the L knee as much as possible adn avoid a full weight shift to the right to facilitate increaed use of the left during sit to stand   with attempts to flex the knee to participate, she compensates with significant hip hiking and trunk left lateral flexion to further avoid knee flexion    GAIT: ambulating with FWW with improved but decreased weight bearing through the L LE (max weight through UE on the walker), step to pattern with reduced L knee flexion in swing and reduced terminal knee extension io initial contact, she was educated at length (verbally and visually)  on gait pattern correction - broke down the components of gait and practiced each one. She was able to demonstrate improvement in L toe off and knee flexion with swing, equalizing step length, and a more continous stepping pattern   she walked for a total of 80 feet on even surfaces in the home and the sidewalk outside of her house with SBA    STEPS: patient went down and up the 4 steps to egress and enter her home with max cues and CGA for safety  the railing is hard to hold as the mailbox is so close to the railing, she required cues for correct sequencing, safe foot placement/LE position on each step, use of weight shifting with ascending the  steps, and control during descent- she was advised to not attempt the steps on her own due to safety concerns    BALANCE: tinetti 9/28 high fall risk NOT RETESTED AT REASSESSMENT    RESPONSE TO TREATMENT:patient demonstrated a positive outcome post treatment and reported a reduction in pain, increased comfort and increased confidence with transfers and mobility. Patient reported good understanding of the HEP and reports confidence in ability to complete the program as prescribed by PT    RESPONSE TO EDUCATION: patient was educated on: safety, fall risk, signs of infection - patient expressed understanding     Patient expressed understanding of all education provided and was able to repeat  back education, precautions, and HEP.    A:ASSESSMENT AND PROGRESS TOWARD GOALS:  Patient demonstrated a positive result to therapy this date as evidenced by an improvement in gait pattern with cues, decreased pain with exercise and walking and patient expressing an understanding of the rehab plan due to therapy verbal and written instructions. Goals established for increased independence in the home, safe mobility in the home, improvement in strength and ROM - all designed to reduce fall risk and progress toward independence.  Patient will benefit from continued PT intervention to progress toward meeting all established goals      P:.continue with progression of mobility, ther ex for strength, ROM, provide education to patient and caregivers to maximize the recovery and reduce the fall risk to progress toward a return to independent function         Skilled services/Home bound verification: MD referral for HHPT following recent hospital stay. HHPT is medically necessary to address  dx and clinical findings: decreased ROM, decreased strength, impaired gait, decreased ability w stair negotiation, increased swelling, decreased bed mobility, decreased transfer status, decreased endurance, decreased balance and decreased safety, increased pain in order to improve functional mobility/quality of life, decrease burden of care, reduce risk for re-hospitalization, work towards patient's personal goals of return to PLOF w decrease risk for falls.    Skilled Reason for admission/summary of clinical condition:  s/p L TKR by Dr Coletta    Caregiver: patient lives in a one story apartment with 4 steps to enter and egress the front door - she lives with her daughter and granddaughter and they are her main caregivers and assist with meals, medications, ADLs, mobility and transportation     Medications reconciled and all medications are available in the home this visit.  The following education was provided regarding medications,  medication interactions, and look a like medications. Meds are effective at this time.  the following discrepancies were noted- patient did not yet have the percocet as it was not called into the pharmacy  patient was educated on the risks and side effects of the percocet (once she gets the prescription) and the need to follow the prescription as written by the MD    Home health supplies ordered/delivered this visit include: NONE    Patient education provided: safety, fall risk, HEP.  Patient/caregiver degree of understanding:good    Home exercise program:patient educated on and demonstrated good technqiue with verbal cues of the following exercises: supine ankle pumps, quad sets, heel slides, SLR, hip abd/add.   sitting LAQ and knee flexion under the chair   standing marching, mini squats  educated on need to complete all exercises 3 times a day for 15 reps each. patient is to walk each hour during the day and is to ice at least 4 times a day for  edema control.

## 2020-11-06 ENCOUNTER — Ambulatory Visit: Attending: Family Medicine | Primary: Family Medicine

## 2020-11-06 ENCOUNTER — Encounter: Admit: 2020-11-06 | Discharge: 2020-11-06 | Payer: MEDICARE | Primary: Family Medicine

## 2020-11-06 ENCOUNTER — Encounter: Primary: Family Medicine

## 2020-11-06 NOTE — Home Health (Signed)
SUBJECTIVE: Patients L knee pain is presently at 4/10. Patient was resting in bed at arrival and declined any recent falls or medication changes.    CAREGIVER INVOLVEMENT/ASSISTANCE NEEDED FOR: Patient lives in a one story home with her family who is assisting with all ADLs and meal preparations as needed. Patient has steps present to enter/exit home.    OBJECTIVE:  See interventions.    PATIENT RESPONSE TO TREATMENT: Patient has much mm guarding present with ROM tasks today in supine and required much vc's to relax and breathe. Rest breaks are required throughout.     PATIENT LEVEL OF UNDERSTANDING OF EDUCATION PROVIDED:  Educated patient on need to use AD during all standing and gait tasks until MD states otherwise to provide stability and balance while decreasing patients fall risk. Patient was instructed on importance of keeping L knee extended throughout the day to increase knee extension ROM. Patient was also educated on importance of elevating L LE above heart level to decrease edema while keeping knee in an extended position for at least 30-45 minutes, 3-5x per day if able.    ASSESSMENT OF PROGRESS TOWARD GOALS: Patient was seen today for a PT follow up and was able to perform supine therex/ROM tasks today with L knee flexion at 75 degrees and knee extension lacking neutral by about 5 degrees. She continues to be educated on need to stop using pillow under her knee and rather under her L foot to increase this ROM. She reports understanding. Patient also performed an increase in gait in home with cues to increase heel strike and step length to normalize.    [place] was educated to take a walk at least once per hour to increase time spent in standing. Patient was also educated on supine: quad sets for 5 second holds, heel slides, ankle pumps, in sitting: LAQ, knee flexion, hip marching all x10-15 reps each, 3-5x per day as able.     THE FOLLOWING DISCHARGE PLANNING WAS DISCUSSED WITH THE  PATIENT/CAREGIVER: Plan to DC patient from home health PT once PT goals are met.     PLAN FOR NEXT VISIT: Plan next visit to take ROM measure and increase therex tasks as patient is able.

## 2020-11-07 ENCOUNTER — Encounter: Admit: 2020-11-07 | Discharge: 2020-11-07 | Payer: MEDICARE | Primary: Family Medicine

## 2020-11-07 NOTE — Home Health (Signed)
 SUBJECTIVE: Patients L knee pain is presently at 0/10 and reports i felt much better after yesterday. . Patient declined any recent falls or medication changes.    CAREGIVER INVOLVEMENT/ASSISTANCE NEEDED FOR: Patient lives in a one story home with her family who is assisting with all ADLs and meal preparations as needed. Patient has steps present to enter/exit home.    OBJECTIVE:  See interventions.    PATIENT RESPONSE TO TREATMENT: Patient has much mm guarding present with ROM tasks today in supine and required much vc's to relax and breathe. Rest breaks are required throughout.     PATIENT LEVEL OF UNDERSTANDING OF EDUCATION PROVIDED:  Educated patient on need to use AD during all standing and gait tasks until MD states otherwise to provide stability and balance while decreasing patients fall risk. Patient was instructed on importance of keeping L knee extended throughout the day to increase knee extension ROM. Patient was also educated on importance of elevating L LE above heart level to decrease edema while keeping knee in an extended position for at least 30-45 minutes, 3-5x per day if able.    ASSESSMENT OF PROGRESS TOWARD GOALS: Patient was seen today for a PT follow up and was able to perform outdoor stairs and gait training today without increase in pain but does require rest after. Patient required consistent cues with steps as patient would attempt to initiate steps with the wrong LE- continued education on this is required. During gait training education to increase swing phase of gait was required to aide with normalizing pattern. Much vc's required to decrease mm guarding during ROM tasks today in supine- educated patient to increase HEP compliance as she reports 1x per day only.    HOME EXERCISE PROGRAM:Patient was educated to take a walk at least once per hour to increase time spent in standing. Patient was also educated on supine: quad sets for 5 second holds, heel slides, ankle pumps, in sitting:  LAQ, knee flexion, hip marching all x10-15 reps each, 3-5x per day as able.     THE FOLLOWING DISCHARGE PLANNING WAS DISCUSSED WITH THE PATIENT/CAREGIVER: Plan to DC patient from home health PT once PT goals are met.     PLAN FOR NEXT VISIT: Plan next visit to take ROM measure and perform outdoor gait training with increased distance to prepare for upcoming MD appt.

## 2020-11-07 NOTE — Progress Notes (Signed)
 Care Transitions Follow Up Call    Challenges to be reviewed by the provider   Additional needs identified to be addressed with provider: no  none           Method of communication with provider : none    Care Transition Nurse (CTN) contacted the patient by telephone to follow up after admission on 10/28/20. No answer. Left HIPPA compliant message. Name, role and contact information provided. Requested return call.  Will attempt to contact at a later time.      Addressed changes since last contact: none  Follow up appointment completed? no.   Was follow up appointment scheduled within 7 days of discharge? yes.     Advance Care Planning:   Does patient have an Advance Directive:  yes; reviewed and current        [facility] follow up appointment(s):   Future Appointments   Date Time Provider Department Center   11/08/2020 To Be Determined Rayburn Benton LITTIE JOSETTA [facility] HR [facility]   11/09/2020 To Be Determined Licia Maree HERO, PT [facility] HR [facility]   11/10/2020 To Be Determined Licia Maree HERO, PT [facility] HR [facility]   11/11/2020 To Be Determined Rayburn Benton LITTIE JOSETTA [facility] HR [facility]   11/12/2020 To Be Determined Hobert Dorothyann HERO, PT [facility] HR [facility]   11/13/2020 To Be Determined Teressa Reena LABOR, RN [facility] HR [facility]   11/14/2020 10:50 AM Cleotilde Selinda SAUNDERS, PA-C [facility]   01/06/2021 10:30 AM Sherryll Lebron SQUIBB, MD [facility] AM[facility]         Goals Addressed                 This Visit's Progress    . Attends follow-up appointments as directed.        Goal: Patient will attend all appointments scheduled within the next 30 days.      Ensure provider appt is scheduled within 7 business days post-discharge; 6/15: TOC appt 7 days post d/c; 6/22 @ 1:30PM. Ortho appt 6/30 @ 1050 AM. Patient aware.  Confirm patient attended post-discharge provider appt ; 6/23: TOC appt cancelled by provider per chart.   Complete post-visit call to confirm attendance and update care needs; 6/23: Attempted.         . Prevent complications post hospitalization.   On  track     Goal: No admissions post 30 days from discharge of 10/29/20.      Review/educate common or potential red flags of condition worsening; 6/16: Reviewed/educated patient on red flags of infection to monitor for and report: increased or worsening pain, redness, warmth at site(s), swelling, bleeding or pus-like drainage, chills, fever (> 100.5), nausea/vomiting  Evaluate adherence to medications and priority barriers to resolve;6/15: Daughter voiced patient received all new meds with exception of Percocet. 6/16: Patient reported she received Percocet yesterday evening.       Instruct on adherence to medications as ordered and assess for therapeutic response and side-effects; 6/16: Patient reported  0/10 left knee pain this AM. Taking Percocet 7.5-10 mg which is providing adequate pain relief.            Review the Home Visit or Home Health documentation for coordinating care and goals; 6/16: Patient active with [facility] (SN/PT). [facility] 6/15. 6/23: Reviewed most recent home health notes.   Observe for trends in symptoms and measures, provide direction to patient, and notify provider as needed

## 2020-11-08 ENCOUNTER — Encounter: Admit: 2020-11-08 | Discharge: 2020-11-08 | Payer: MEDICARE | Primary: Family Medicine

## 2020-11-08 NOTE — Home Health (Signed)
SUBJECTIVE: Patients L knee pain is presently at 0/10. Patient declined any recent falls or medication changes.    CAREGIVER INVOLVEMENT/ASSISTANCE NEEDED FOR: Patient lives in a one story home with her family who is assisting with all ADLs and meal preparations as needed. Patient has steps present to enter/exit home.    OBJECTIVE:  See interventions.    PATIENT RESPONSE TO TREATMENT: Patient has much mm guarding present with ROM tasks today in supine and required much vc's to relax and breathe. Rest breaks are required throughout.     PATIENT LEVEL OF UNDERSTANDING OF EDUCATION PROVIDED:  Educated patient on need to use AD during all standing and gait tasks until MD states otherwise to provide stability and balance while decreasing patients fall risk. Patient was instructed on importance of keeping L knee extended throughout the day to increase knee extension ROM. Patient was also educated on importance of elevating L LE above heart level to decrease edema while keeping knee in an extended position for at least 30-45 minutes, 3-5x per day if able.    ASSESSMENT OF PROGRESS TOWARD GOALS: Patient was seen today for a PT follow up and was able to perform going up and down outdoor stairs to prepare for MD appt next week with SBA and UE support required on handrails- she performed with step to step pattern and does require cueing to carryover properly. Patient also performed sitting therex tasks with emphasis on increasing L knee extension AG with hold to aide with increasing strength and control. Patients pain level increased some with therex tasks but returned to painfree following session.     [place] was educated to take a walk at least once per hour to increase time spent in standing. Patient was also educated on supine: quad sets for 5 second holds, heel slides, SAQ, ankle pumps, in sitting: LAQ, knee flexion, hip marching all x10-15 reps each, 3-5x per day as able.     THE FOLLOWING  DISCHARGE PLANNING WAS DISCUSSED WITH THE PATIENT/CAREGIVER: Plan to DC patient from home health PT once PT goals are met.     PLAN FOR NEXT VISIT: Patient is aware and agreeable to weekend PT visits.

## 2020-11-09 ENCOUNTER — Encounter: Primary: Family Medicine

## 2020-11-09 ENCOUNTER — Emergency Department
Admission: EM | Admit: 2020-11-09 | Discharge: 2020-11-09 | Discharge status: Against Medical Advice | Payer: Medicare HMO

## 2020-11-10 ENCOUNTER — Encounter: Admit: 2020-11-10 | Discharge: 2020-11-10 | Payer: MEDICARE | Primary: Family Medicine

## 2020-11-10 NOTE — Home Health (Signed)
 SUBJECTIVE: Pt states that she is doing well today, notes that she has no new complaints or concerns. Reports that she went to church this morning. Rates pain 4/10 currently. Denies any falls or medication changes at this time.     CAREGIVER INVOLVEMENT/ASSISTANCE NEEDED FOR: Pt lives in a one story ranch style appartment, with one stair to enter and exit home. She lives here with her mother who is also her caregiver who she relies on for dressing, bathing, cooking and cleaning.     OBJECTIVE:  See interventions.    PATIENT RESPONSE TO TREATMENT: Pt responded well to PT today, encouraged by tolerance to therex today. Reports that she felt like I had a good workout post PT today.     PATIENT LEVEL OF UNDERSTANDING OF EDUCATION PROVIDED: Provided education today on importance of HEP and how often to complete. Educated on signs ans sx of infection and step to take if one were to arise. Educated to continue to use ice therapy 20 mins on off for pain control. Pt verbalized understanding of all above.     ASSESSMENT OF PROGRESS TOWARD GOALS: Pt was seen for PT follow up session for progression of therex, and to assess sit to stand transfers. Pt demonstrated better tolerance to therex with today with ability to complete 20 reps without rest breaks. See interventions for all therex done today. Sit to stand transfers from edge of bed assessed, pt needed cuing for proper hand placement and to scoot to edge of bed. ROM assessed today with pt demonstrating AAROM knee flexion of 75 deg with use of strap, pt currently pogressing well towards goals at this time.     HOME EXERCISE PROGRAM: Supine therex done today quad sets, gluteal sets, heel slides with strap, ankle pumps, SLR x20 B   Seated therex LAQ x20 B. Instructed to complete 2-3 times per day.     THE FOLLOWING DISCHARGE PLANNING WAS DISCUSSED WITH THE PATIENT/CAREGIVER: Pt to be D/C from [facility] PT next week by supervising PT as she is progressing well towards goals  at this time.     PLAN FOR NEXT VISIT: Continue with LE strengthening and re-asses tinetti.

## 2020-11-11 ENCOUNTER — Encounter: Admit: 2020-11-11 | Discharge: 2020-11-11 | Payer: MEDICARE | Primary: Family Medicine

## 2020-11-11 NOTE — Home Health (Signed)
SUBJECTIVE: Patients L knee pain is presently at 4/10. Patient declined any recent falls or medication changes.    CAREGIVER INVOLVEMENT/ASSISTANCE NEEDED FOR: Patient lives in a one story home with her family who is assisting with all ADLs and meal preparations as needed. Patient has steps present to enter/exit home.    OBJECTIVE:  See interventions.    PATIENT RESPONSE TO TREATMENT: Patient has much mm guarding present with ROM tasks today in supine and required much vc's to relax and breathe. Rest breaks are required throughout.     PATIENT LEVEL OF UNDERSTANDING OF EDUCATION PROVIDED:  Educated patient on need to use AD during all standing and gait tasks until MD states otherwise to provide stability and balance while decreasing patients fall risk. Patient was instructed on importance of keeping L knee extended throughout the day to increase knee extension ROM. Patient was also educated on importance of elevating L LE above heart level to decrease edema while keeping knee in an extended position for at least 30-45 minutes, 3-5x per day if able.    HOME EXERCISE PROGRAM:Patient was educated to take a walk at least once per hour to increase time spent in standing. Patient was also educated on supine: quad sets for 5 second holds, heel slides, SAQ, ankle pumps, in sitting: LAQ, knee flexion, hip marching all x10-15 reps each, 3-5x per day as able.     Assessment and Summary of Care:  Patient's current functional status before discharge is as follows  Strength: Please address at DC  ROM:  Patients Left knee AAROM is 5-80 degrees following much stretching and ROM  Bed Mobility: Patient is (I)  Transfers: Patient is performing all household transfers with supervision  Gait/WC mobility: Patient continues to require the use of FWW and is ambulating about 300 feet before she requires a sitting rest break.  Stairs: Patient is performing with supervision  Recommendations: Plan to DC patient from home health PT and  following MD recommendations

## 2020-11-12 ENCOUNTER — Encounter: Admit: 2020-11-12 | Discharge: 2020-11-12 | Payer: MEDICARE | Primary: Family Medicine

## 2020-11-12 NOTE — Home Health (Signed)
S: patient reports that she is still in a lot of pain and it continues to limit her daily activities     O: PAIN: pain in the left knee is still a large deterent to her progress and mobility   patient to apply ice to the left knee for pain and swelling control per MD protocol using a barrier to protect the skin. Patient to monitor the skin for any adverse effects such as color changes, irritation, mottled appearance. Patient to use ice for 20 minutes 4-5 times a day.    WOUND:L knee covered in the MEPILEX bandage  - wound not visible. no drainage -  per discussion with LPTA, incision is healing well with re-epithelialization and wounds well approximated    ROM: L knee extension -8 in supine quad set AT EVALUATION  L knee flexion 48 degrees in supine heel slide AT EVALUATION  L knee extension 0 degrees in supine quad set AT REASSESSMENT  L knee flexion 60 degrees active in supine heel slide AT REASSESSMENT  L knee flexion in supine with overpressure with patient using a towel 64 degrees AT REASSESSMENT  L kbee extensor lag -34 degrees in seated LAQ AT REASSESSMENT  L knee flexion in heel slide using a belt for overpressure 70 degrees AT DISCHARGE  L knee extension 0 degrees AT DISCHARGE  L knee extensor lag -12 degrees AT DISCHARGE     STRENGTH: R knee ext 4+/5, R knee flexion 4+/5, R hip flex 4/5, R hip abd/adduction 4/5  L knee flexion and extension 3-/5, L hip flexion 3-/5 abduction and adduction 3-/5 NO CHANGES IN STRENGTH GRADES    BED MOBILITY: patient is independent with bed mobility    EQUIPMENT: bedside commode, FWW    TRANSFERS: mod I with transfers to and from the toilet, bed, chair    GAIT/STEPS:She is walking with a FWW with modified independence on even surfaces but continues to need SBA when regressing the house due to fall risk and pain. Her gait remains antalgic with poor left knee flexion and toe off during terminal stance and swing phase - despite max verbal, visual and tactile cues to increase her  knee flexion in swing. She is able to egress and enter her house via 4 steps with SBA and cues for safety.    BALANCE: tinetti 9/28 high fall risk  AT REASSESSMENT  tinetti 13/28 AT DISCHARGE  greater than a 4 point increase in the tinetti score indicates a statistically significant reduction in fall risk      RESPONSE TO TREATMENT:patient demonstrated a positive outcome post treatment and reported a reduction in pain, increased comfort and increased confidence with transfers and mobility. Patient reported good understanding of the HEP and reports confidence in ability to complete the program as prescribed by PT    RESPONSE TO EDUCATION: patient was educated on: safety, fall risk, signs of infection - patient expressed understanding   Patient expressed understanding of all education provided and was able to repeat back education, precautions, and HEP.    A:ASSESSMENT AND PROGRESS TOWARD GOALS:  Patient has been seen for home care PT following a L TKR by Dr Meyer Cory. She has made some progress in all areas. She in independent with all ADLs at this time and all transfers in and out of the home. She is walking with a FWW with modified independence on even surfaces but continues to need SBA when regressing the house due to fall risk and pain. Her gait remains antalgic  with poor left knee flexion and toe off during terminal stance and swing phase - despite max verbal, visual and tactile cues to increase her knee flexion in swing. She is able to egress and enter her house via 4 steps with SBA and cues for safety. Current range of motion is L knee extension 0 degrees and L knee flexion 70 degrees in assisted knee flexion during a supine heel slide. She is independent with her HEP. She will transition to outpatient therapy at her follow up with Dr Meyer Cory nd is being discharged from home care PT today.]  P: DC PT    Skilled services/Home bound verification: MD referral for HHPT following recent hospital stay. HHPT is medically  necessary to address  dx and clinical findings: decreased ROM, decreased strength, impaired gait, decreased ability w stair negotiation, increased swelling, decreased bed mobility, decreased transfer status, decreased endurance, decreased balance and decreased safety, increased pain in order to improve functional mobility/quality of life, decrease burden of care, reduce risk for re-hospitalization, work towards patient's personal goals of return to PLOF w decrease risk for falls.    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  The following education was provided regarding medications, medication interactions, and look a like medications. Meds are effective at this time.  the following discrepancies were noted- patient did not yet have the percocet as it was not called into the pharmacy  patient was educated on the risks and side effects of the percocet (once she gets the prescription) and the need to follow the prescription as written by the MD  Patient education provided: safety, fall risk, HEP.  Patient/caregiver degree of understanding:good    Home exercise program:patient educated on and demonstrated good technqiue with verbal cues of the following exercises: supine ankle pumps, quad sets, heel slides, SLR, hip abd/add.   sitting LAQ and knee flexion under the chair   standing marching, mini squats  educated on need to complete all exercises 3 times a day for 15 reps each. patient is to walk each hour during the day and is to ice at least 4 times a day for edema control.

## 2020-11-13 ENCOUNTER — Encounter: Admit: 2020-11-13 | Discharge: 2020-11-13 | Payer: MEDICARE | Primary: Family Medicine

## 2020-11-13 ENCOUNTER — Encounter

## 2020-11-13 NOTE — Home Health (Signed)
Skilled reason for visit: ASSESSMENT AND DSG CHANGES FOR LT TOTAL KNEE SURGERY.  Caregiver involvement: FAMILY COOK, CLEANS AND TAKES PATIENT TO MD APPT.Marland Kitchen    Medications reviewed and all medications are available in the home this visit.    The following education was provided regarding medications:  MEDICATIONS RECONCILED WITH PATIENT..    MD notified of any discrepancies/look a-like medications/medication interactions: NA  Medications are EFFECTIVE at this time.      Home health supplies by type and quantity ordered/delivered this visit include:NA    Patient education provided this visit: TAUGHT S/S OF INFECTION AT LT [place]. INSTRUCTED ON MEDICATIONS AND DIETARY COMPLIANCY. INSTRUCTED ON WHEN TO NOTIFY MD OF MEDICAL CHANGES R/T LT KNEE SURGICAL SITE.    [place] education provided: NA    Patient level of understanding of education provided: PATIENT VERBALIZED UNDERSTANDING OF  EDUCATION TAUGHT TODAY.    Skilled Care Performed this visit: ASSESSMENT OF LT KNEE , DSG INTACT, DSG CHANGED ON Friday  Patient response to procedure performed:  [place], STATES GOING TO NEED MORE PAIN  MEDS,       Patient's Progress towards personal goals: MANAGING SURGERY WELL EVEN WITH PAIN.    Home exercise program:BREATHING EXERCISES TO HELP PREVENT PNEUMONIA.    Continued need for the following skills: Nursing and Physical Therapy    Plan for next visit: [place], TEACHING ON DISEASE PROCESS.    Patient and/or caregiver notified and agrees to changes in the Plan of Care YES      The following discharge planning was discussed with the pt/caregiver: PATIENT WILL BE DISCHARGED ONCE ALL GOALS HAVE BEEN MET AND MEDICALLY STABLE.

## 2020-11-13 NOTE — Telephone Encounter (Signed)
 Patient has an appt tomorrow but states she is out of medication.    Last Visit: 10/28/20 post-op with MD Marlow  Next Appointment: 11/14/20 with PA Cleotilde  Previous Refill Encounter(s): 11/05/20 #42    Requested Prescriptions     Pending Prescriptions Disp Refills   . oxyCODONE-acetaminophen (Percocet) 10-325 mg per tablet 42 Tablet 0     Sig: Take 1-2 Tablets by mouth every eight (8) hours as needed for Pain for up to 7 days. Max Daily Amount: 6 Tablets.         For Pharmacy Admin Tracking Only    . CPA in place:   . Recommendation Provided To:   SABRA Intervention Detail: New Rx: 1, reason: Patient Preference  . Gap Closed?:   . Intervention Accepted By:   . Time Spent (min): 5

## 2020-11-13 NOTE — Telephone Encounter (Signed)
Patient called to follow up on refill request - she's requesting we let her know once called in, [phone].

## 2020-11-14 ENCOUNTER — Encounter: Attending: Physician Assistant | Primary: Family Medicine

## 2020-11-14 MED ORDER — OXYCODONE-ACETAMINOPHEN 10 MG-325 MG TAB
10-325 mg | ORAL_TABLET | Freq: Three times a day (TID) | ORAL | 0 refills | Status: DC | PRN
Start: 2020-11-14 — End: 2020-11-19

## 2020-11-14 NOTE — Telephone Encounter (Signed)
Call pt for rx pick up

## 2020-11-15 NOTE — Progress Notes (Signed)
Care Transitions Follow Up Call    Challenges to be reviewed by the provider   Additional needs identified to be addressed with provider: yes  labs- Patient seen in ED for left knee pain and request for pain medication. Potassium level 5.9; repeat potassium 5.6. ED instructed patient to HOLD K+ X 2 days. Patient taking 20 mEq, 2 tabs 3 times daily. Taking Spironolactone 25 mg daily. Please advise.            Method of communication with provider : chart routing    Care Transition Nurse (CTN) contacted the patient by telephone to follow up after admission on 10/28/20. Verified name and DOB with patient as identifiers.    Addressed changes since last contact: labs- potassium 5.6 in ED  Follow up appointment completed? yes.   Was follow up appointment scheduled within 7 days of discharge? no.     Advance Care Planning:   Does patient have an Advance Directive:  yes; reviewed and current     CTN reviewed discharge instructions, medical action plan and red flags with patient and discussed any barriers to care and/or understanding of plan of care after discharge. Discussed appropriate site of care based on symptoms and resources available to patient including: PCP, Specialist, When to call 911 and CTN. The patient agrees to contact the PCP office for questions related to their healthcare.     Patients top risk factors for readmission: medical condition-high potassium/left knee pain   Interventions to address risk factors: CTN reviewed notes from ED visit [facility]) on 6/29. Patient voiced she went for knee pain and to request refill on pain medication but was found to have high potassium. K= was 5.9; recheck 5.6. Patient was advised to HOLD potassium pills X 2 days (7/1 and 7/2). Patient currently taking Potassium 20 mEq 2 tabs 3 times daily. Patient to have BMP repeated to check potassium level. CTN reviewed educated patient/daughter on red flags of hyperkalemia and when to report to ED. CTN escalated chart  to PCP.  Patient advised to contact ortho office at least 4-5 days prior to running out of pain medication. Patient informed providers are given 48 hours to respond to all medication request. Patient voiced understanding.    Patient reported left knee pain is usually between 8-9/10. After taking pain med, she goes to sleep but after waking pain is back to 8-9. Patient scheduled to follow up with ortho on 7/5. Patient denied increased, redness, swelling, drainage. Did report some warmth around surrounding are. Denied SOB, CP, or NV.     [facility] follow up appointment(s):   Future Appointments   Date Time Provider Department Center   11/19/2020  2:45 PM Brillhart, Roberto Scales, PA-C [facility]   01/06/2021 10:30 AM Mable Fill, MD [facility]     Non-[facility] follow up appointment(s): none    CTN provided contact information for future needs. Plan for follow-up call in 5-7 days based on severity of symptoms and risk factors.  Plan for next call: symptom management-assess for hyperkalemia and left knee pain and follow up appointment-verify attendance of ortho appt on 7/5     Goals Addressed    None

## 2020-11-19 ENCOUNTER — Ambulatory Visit: Attending: Surgical | Primary: Family Medicine

## 2020-11-19 ENCOUNTER — Encounter

## 2020-11-19 ENCOUNTER — Ambulatory Visit: Admit: 2020-11-19 | Discharge: 2020-11-19 | Payer: MEDICARE | Attending: Surgical | Primary: Family Medicine

## 2020-11-19 DIAGNOSIS — Z96659 Presence of unspecified artificial knee joint: Secondary | ICD-10-CM

## 2020-11-19 DIAGNOSIS — Z471 Aftercare following joint replacement surgery: Secondary | ICD-10-CM

## 2020-11-19 MED ORDER — OXYCODONE-ACETAMINOPHEN 10 MG-325 MG TAB
10-325 mg | ORAL_TABLET | Freq: Four times a day (QID) | ORAL | 0 refills | Status: AC | PRN
Start: 2020-11-19 — End: 2020-11-26

## 2020-11-19 NOTE — Telephone Encounter (Signed)
Repeat BMP order placed for ER ff-up hyperkalemia, pls verify current med doses as well, esp K replacement.   Called patient. No answer. Voice mail is full

## 2020-11-19 NOTE — Progress Notes (Signed)
Cheryl Paul returns to the office status post her primary left total knee replacement.  Her surgery was on 28 October 2020.  She is walking with the assistance of a 4 post rolling walker currently.  She is weightbearing as tolerated left lower extremity.  Generally she is pleased with the surgical outcome to date.  She requested pain medication refill today.  She is on thromboembolism prophylaxis per protocol.    No chest pain or shortness of breath today.    Left knee reveals over the anterior surface in a craniocaudal orientation 15 cm with skin borders well approximated surgical staples are present.  No evidence of surrounding erythema.  No indurations or fluctuance.    While supine patient has improving range of motion today noted at 90 degrees of flexion -10 degrees to full extension.    Procedural: Using clean technique all staples today removed with no complications.  Steri-Strips were placed with a sterile top-cover.    [facility] [address] 11/19/2020 space 2 view of the left knee AP and lateral reveals total joint prosthesis intact with no evidence of periprosthetic fracture or dislocation.  Alignment is anatomical.    Plan: Staples removed today.  Steri-Strips were placed.  Patient to begin outpatient physical therapy services per protocol.  She will follow back in 2 weeks with Burtis Junes, PA-C.  Pain medication refill today per protocol.  Patient to taper pain medication my direction.

## 2020-11-21 ENCOUNTER — Encounter: Attending: Surgical | Primary: Family Medicine

## 2020-11-22 NOTE — Progress Notes (Signed)
 Care Transitions Follow Up Call    Challenges to be reviewed by the provider   Additional needs identified to be addressed with provider: yes  medications- CTN reconciled medications. Patient's last dose of K+ was 6/29. Per ED instructions patient was only to HOLD K+ X 48 hrs. CTN advised patient to resume K+ as instructed. Celebrex was discontinued at time of d/c. Possible cause of hyperkalemia           Method of communication with provider : chart routing    Care Transition Nurse (CTN) contacted the patient by telephone to follow up after admission on 10/28/20. No answer. Left HIPPA compliant message. Name, role and contact information provided. Requested return call.       Verified name and DOB with patient as identifiers.    Addressed changes since last contact: none  Follow up appointment completed? yes.   Was follow up appointment scheduled within 7 days of discharge? yes.     Advance Care Planning:   Does patient have an Advance Directive:  yes; reviewed and current     CTN reviewed discharge instructions, medical action plan and red flags with patient and discussed any barriers to care and/or understanding of plan of care after discharge. Discussed appropriate site of care based on symptoms and resources available to patient including: PCP, Specialist and CTN. The patient agrees to contact the PCP office for questions related to their healthcare.     Patients top risk factors for readmission: medical condition-left total knee/hyperkalemia   Interventions to address risk factors: Education of patient/family/caregiver/guardian to support self-management-Patient reported she has been applying alcohol and peroxide to surgical wound with Q-tip. CTN advsised patient to refrain apply anything to surgical wound unless instructed by ortho or PCP. Patient voiced understanding. , Assessment and support for treatment adherence and medication management-Patient's last dose of K+ on 6/29. Per ED instructions, patient was  to resume K+ on 7/1. Patient advised to resume K+. Patient reported abd pain after taking 2 tabs of Percocet on 7/5 and 2 tabs later that evening. CTN advised patient take Percocet as directed and reviewed dosage instructions. CTN also suggested patient eat prior to taking narcotic to help prevent stomach pain and nausea and Patient reported steri-strips to right knee. Patient verbalized she has applied alocohol and peroxide to surgical wound. CTN advised patient to refrain from applkying anything to surgical wound unless instructed by PCP or ortho.    [facility] follow up appointment(s):   Future Appointments   Date Time Provider Department Center   01/06/2021 10:30 AM Sherryll Lebron SQUIBB, MD [facility]     Non-[facility] follow up appointment(s): none    CTN provided contact information for future needs. No further follow-up call indicated based on severity of symptoms and risk factors.  Plan for next call: none     Goals Addressed                 This Visit's Progress    . Attends follow-up appointments as directed.   On track     Goal: Patient will attend all appointments scheduled within the next 30 days.      Ensure provider appt is scheduled within 7 business days post-discharge; 6/15: TOC appt 7 days post d/c; 6/22 @ 1:30PM. Ortho appt 6/30 @ 1050 AM. Patient aware.  Confirm patient attended post-discharge provider appt ; 6/23: TOC appt cancelled by provider per chart. 7/8: Attended ortho follow up on 7/5.  Complete post-visit call to confirm  attendance and update care needs; 6/23: Attempted. 7/1: Done. 7/8: Done.         . Prevent complications post hospitalization.        Goal: No admissions post 30 days from discharge of 10/29/20.      Review/educate common or potential red flags of condition worsening; 6/16: Reviewed/educated patient on red flags of infection to monitor for and report: increased or worsening pain, redness, warmth at site(s), swelling, bleeding or pus-like drainage, chills, fever (> 100.5),  nausea/vomiting  7/8: Reviewed red flags of hyperkalemia to monitor and report:    M: Muscle weakness  U: Urine output decrease or no urine output  R: Respiratory failure  D: Decreased cardiac output (weak pulse or low HR)  E: Early muscles twitches or cramps  R: Rhythm changes (irregular heartbeat)  Evaluate adherence to medications and priority barriers to resolve;6/15: Daughter voiced patient received all new meds with exception of Percocet. 6/16: Patient reported she received Percocet yesterday evening. 7/8: Reinforced instruction to take Percocet as prescribed.  Instruct on adherence to medications as ordered and assess for therapeutic response and side-effects; 6/16: Patient reported  0/10 left knee pain this AM. Taking Percocet 7.5-10 mg which is providing adequate pain relief.            Review the Home Visit or Home Health documentation for coordinating care and goals; 6/16: Patient active with [facility] (SN/PT). [facility] 6/15. 6/23: Reviewed most recent home health notes.

## 2020-12-02 ENCOUNTER — Telehealth

## 2020-12-02 MED ORDER — OXYCODONE-ACETAMINOPHEN 10 MG-325 MG TAB
10-325 mg | ORAL_TABLET | Freq: Three times a day (TID) | ORAL | 0 refills | Status: AC | PRN
Start: 2020-12-02 — End: 2020-12-09

## 2020-12-02 NOTE — Telephone Encounter (Signed)
rx done and sent to pharmacy

## 2020-12-02 NOTE — Telephone Encounter (Signed)
Patient called requesting for pain medication, patient stated that she is in pain & been having pain since her staples has been removed. Patient contact [phone]

## 2020-12-02 NOTE — Telephone Encounter (Signed)
Contacted pt at her home number.  Informed her that prescription was sent to her pharmacy.

## 2020-12-06 NOTE — Telephone Encounter (Signed)
Spoke with patient and she states she is taking potassium 20 meg 2 tabs 3 x a day. Patient was told to stop her Celebrex by ER visit. Patient has been advised to have repeat non fasting labs done next week. Patient verbalizes understanding.

## 2020-12-20 ENCOUNTER — Inpatient Hospital Stay: Admit: 2020-12-20 | Payer: MEDICARE | Primary: Family Medicine

## 2020-12-20 DIAGNOSIS — K909 Intestinal malabsorption, unspecified: Secondary | ICD-10-CM

## 2020-12-20 LAB — VITAMIN B12 & FOLATE
Folate: 10.5 ng/mL (ref 3.10–17.50)
Folate: 10.5 ng/mL (ref 3.10–17.50)
Vitamin B-12: 884 pg/mL (ref 211–911)
Vitamin B12: 884 pg/mL (ref 211–911)

## 2020-12-20 LAB — CBC WITH AUTO DIFFERENTIAL
Basophils %: 1 % (ref 0–2)
Basophils Absolute: 0.1 10*3/uL (ref 0.0–0.1)
Eosinophils %: 3 % (ref 0–5)
Eosinophils Absolute: 0.2 10*3/uL (ref 0.0–0.4)
Granulocyte Absolute Count: 0 10*3/uL (ref 0.00–0.04)
Hematocrit: 40.5 % (ref 35.0–45.0)
Hemoglobin: 12 g/dL (ref 12.0–16.0)
Immature Granulocytes: 0 % (ref 0.0–0.5)
Lymphocytes %: 26 % (ref 21–52)
Lymphocytes Absolute: 1.7 10*3/uL (ref 0.9–3.6)
MCH: 26.9 PG (ref 24.0–34.0)
MCHC: 29.6 g/dL — ABNORMAL LOW (ref 31.0–37.0)
MCV: 90.8 FL (ref 78.0–100.0)
MPV: 14 FL — ABNORMAL HIGH (ref 9.2–11.8)
Monocytes %: 7 % (ref 3–10)
Monocytes Absolute: 0.5 10*3/uL (ref 0.05–1.2)
NRBC Absolute: 0 10*3/uL (ref 0.00–0.01)
Neutrophils %: 64 % (ref 40–73)
Neutrophils Absolute: 4.3 10*3/uL (ref 1.8–8.0)
Nucleated RBCs: 0 PER 100 WBC
Platelets: 150 10*3/uL (ref 135–420)
RBC: 4.46 M/uL (ref 4.20–5.30)
RDW: 13.7 % (ref 11.6–14.5)
WBC: 6.7 10*3/uL (ref 4.6–13.2)

## 2020-12-20 LAB — HEMOGLOBIN A1C W/EAG
Hemoglobin A1C: 5.7 % — ABNORMAL HIGH (ref 4.2–5.6)
eAG: 117 mg/dL

## 2020-12-20 LAB — COMPREHENSIVE METABOLIC PANEL
ALT: 18 U/L (ref 13–56)
AST: 16 U/L (ref 10–38)
Albumin/Globulin Ratio: 1.1 (ref 0.8–1.7)
Albumin: 3.5 g/dL (ref 3.4–5.0)
Alkaline Phosphatase: 82 U/L (ref 45–117)
Anion Gap: 5 mmol/L (ref 3.0–18)
BUN: 12 MG/DL (ref 7.0–18)
Bun/Cre Ratio: 20 (ref 12–20)
CO2: 28 mmol/L (ref 21–32)
Calcium: 8.8 MG/DL (ref 8.5–10.1)
Chloride: 109 mmol/L (ref 100–111)
Creatinine: 0.59 MG/DL — ABNORMAL LOW (ref 0.6–1.3)
EGFR IF NonAfrican American: >60 mL/min/{1.73_m2} (ref >60)
GFR African American: >60 mL/min/{1.73_m2} (ref >60)
Globulin: 3.2 g/dL (ref 2.0–4.0)
Glucose: 93 mg/dL (ref 74–99)
Potassium: 4.2 mmol/L (ref 3.5–5.5)
Sodium: 142 mmol/L (ref 136–145)
Total Bilirubin: 0.5 MG/DL (ref 0.2–1.0)
Total Protein: 6.7 g/dL (ref 6.4–8.2)

## 2020-12-20 LAB — IRON AND TIBC
Iron Saturation: 14 % — ABNORMAL LOW (ref 20–50)
Iron: 45 ug/dL — ABNORMAL LOW (ref 50–175)
TIBC: 328 ug/dL (ref 250–450)

## 2020-12-20 LAB — MICROALBUMIN / CREATININE URINE RATIO
Creatinine, Ur: 133 mg/dL — ABNORMAL HIGH (ref 30–125)
Microalb, Ur: 0.54 MG/DL (ref 0–3.0)
Microalbumin Creatinine Ratio: 4 mg/g (ref 0–30)

## 2020-12-20 LAB — FERRITIN
Ferritin: 119 NG/ML (ref 8–388)
Ferritin: 119 NG/ML (ref 8–388)

## 2020-12-20 LAB — VITAMIN D 25 HYDROXY: Vit D, 25-Hydroxy: 48.2 ng/mL (ref 30–100)

## 2020-12-20 LAB — METABOLIC PANEL, COMPREHENSIVE
A-G Ratio: 1.1 (ref 0.8–1.7)
ALT (SGPT): 18 U/L (ref 13–56)
AST (SGOT): 16 U/L (ref 10–38)
Albumin: 3.5 g/dL (ref 3.4–5.0)
Alk. phosphatase: 82 U/L (ref 45–117)
Anion gap: 5 mmol/L (ref 3.0–18)
BUN/Creatinine ratio: 20 (ref 12–20)
BUN: 12 MG/DL (ref 7.0–18)
Bilirubin, total: 0.5 MG/DL (ref 0.2–1.0)
CO2: 28 mmol/L (ref 21–32)
Calcium: 8.8 MG/DL (ref 8.5–10.1)
Chloride: 109 mmol/L (ref 100–111)
Creatinine: 0.59 MG/DL — ABNORMAL LOW (ref 0.6–1.3)
GFR est AA: >60 mL/min/{1.73_m2} (ref >60)
GFR est non-AA: >60 mL/min/{1.73_m2} (ref >60)
Globulin: 3.2 g/dL (ref 2.0–4.0)
Glucose: 93 mg/dL (ref 74–99)
Potassium: 4.2 mmol/L (ref 3.5–5.5)
Protein, total: 6.7 g/dL (ref 6.4–8.2)
Sodium: 142 mmol/L (ref 136–145)

## 2020-12-20 LAB — IRON PROFILE
Iron % saturation: 14 % — ABNORMAL LOW (ref 20–50)
Iron: 45 ug/dL — ABNORMAL LOW (ref 50–175)
TIBC: 328 ug/dL (ref 250–450)

## 2020-12-20 LAB — CBC WITH AUTOMATED DIFF
ABS. BASOPHILS: 0.1 10*3/uL (ref 0.0–0.1)
ABS. EOSINOPHILS: 0.2 10*3/uL (ref 0.0–0.4)
ABS. IMM. GRANS.: 0 10*3/uL (ref 0.00–0.04)
ABS. LYMPHOCYTES: 1.7 10*3/uL (ref 0.9–3.6)
ABS. MONOCYTES: 0.5 10*3/uL (ref 0.05–1.2)
ABS. NEUTROPHILS: 4.3 10*3/uL (ref 1.8–8.0)
ABSOLUTE NRBC: 0 10*3/uL (ref 0.00–0.01)
BASOPHILS: 1 % (ref 0–2)
EOSINOPHILS: 3 % (ref 0–5)
HCT: 40.5 % (ref 35.0–45.0)
HGB: 12 g/dL (ref 12.0–16.0)
IMMATURE GRANULOCYTES: 0 % (ref 0.0–0.5)
LYMPHOCYTES: 26 % (ref 21–52)
MCH: 26.9 PG (ref 24.0–34.0)
MCHC: 29.6 g/dL — ABNORMAL LOW (ref 31.0–37.0)
MCV: 90.8 FL (ref 78.0–100.0)
MONOCYTES: 7 % (ref 3–10)
MPV: 14 FL — ABNORMAL HIGH (ref 9.2–11.8)
NEUTROPHILS: 64 % (ref 40–73)
NRBC: 0 PER 100 WBC
PLATELET: 150 10*3/uL (ref 135–420)
RBC: 4.46 M/uL (ref 4.20–5.30)
RDW: 13.7 % (ref 11.6–14.5)
WBC: 6.7 10*3/uL (ref 4.6–13.2)

## 2020-12-20 LAB — VITAMIN D, 25 HYDROXY: Vitamin D 25-Hydroxy: 48.2 ng/mL (ref 30–100)

## 2020-12-20 LAB — HEMOGLOBIN A1C WITH EAG
Est. average glucose: 117 mg/dL
Hemoglobin A1c: 5.7 % — ABNORMAL HIGH (ref 4.2–5.6)

## 2020-12-20 LAB — MICROALBUMIN, UR, RAND W/ MICROALB/CREAT RATIO
Creatinine, urine random: 133 mg/dL — ABNORMAL HIGH (ref 30–125)
Microalbumin,urine random: 0.54 MG/DL (ref 0–3.0)
Microalbumin/Creat ratio (mg/g creat): 4 mg/g (ref 0–30)

## 2020-12-20 NOTE — Progress Notes (Signed)
Labs discussed at 01/22/2021 appt

## 2020-12-20 NOTE — Progress Notes (Signed)
K back to normal- pls verify her current med dose for K and aldactone, plan to cont  Mildly low iron- is she taking daily supplement?  Due for ff-up sept, pls sched then. Will discuss labs further

## 2020-12-20 NOTE — Progress Notes (Signed)
Progress Notes by Sherlean Foot, LPN at 16/30/56 2359                Author: Sherlean Foot, LPN  Service: --  Author Type: Licensed Nurse       Filed: 02/20/21 1705  Date of Service: 12/20/20 2359  Status: Signed          Editor: Sherlean Foot, LPN (Licensed Nurse)               Patient aware of labs aaat 01/22/2021 appt

## 2020-12-23 ENCOUNTER — Telehealth

## 2020-12-23 NOTE — Telephone Encounter (Signed)
Post op patient is requesting pain medication - she said the current medication makes her sleep, so if we could prescribe something different she would be ok with that.  Patient prefers [facility] on file for this.  Please advise her once done, [phone].

## 2020-12-24 LAB — VITAMIN B1, WHOLE BLOOD
Vitamin B1,Whole Blood: 197.4 nmol/L (ref 66.5–200.0)
Vitamin B1: 197.4 nmol/L (ref 66.5–200.0)

## 2020-12-24 MED ORDER — HYDROCODONE-ACETAMINOPHEN 7.5 MG-325 MG TAB
ORAL_TABLET | Freq: Three times a day (TID) | ORAL | 0 refills | Status: AC | PRN
Start: 2020-12-24 — End: 2020-12-31

## 2020-12-24 NOTE — Telephone Encounter (Signed)
Norco rx sent to pharmacy

## 2020-12-30 NOTE — Telephone Encounter (Signed)
 Heather from [employer] called to provide a status of patient:    Patient has attended about 10 visits, however, still lacks significant flexion - generously about 40-45 degrees.  Patient has a lot of guarding during therapy and has placed herself on hold for about a month due to financial limitations.  Heather offered her reduced visits per week and patient declined.  Heather wanted us  to be aware of their perspective regarding patients progress.

## 2021-01-03 ENCOUNTER — Encounter: Attending: Physician Assistant | Primary: Family Medicine

## 2021-01-03 NOTE — Telephone Encounter (Signed)
Patient was contacted to reschedule today's post op appointment.  She's only been seen once since surgery in June.  Please review to determine if patient should wait until next available appointment on 9/16 or be seen sooner.  Patient can be reached at [phone].

## 2021-01-03 NOTE — Telephone Encounter (Signed)
Would like to see her sooner, next week or two

## 2021-01-06 ENCOUNTER — Encounter: Attending: Family Medicine | Primary: Family Medicine

## 2021-01-08 ENCOUNTER — Ambulatory Visit: Attending: Physician Assistant | Primary: Family Medicine

## 2021-01-08 ENCOUNTER — Ambulatory Visit: Admit: 2021-01-08 | Discharge: 2021-01-08 | Payer: MEDICARE | Attending: Physician Assistant | Primary: Family Medicine

## 2021-01-08 ENCOUNTER — Encounter

## 2021-01-08 DIAGNOSIS — M1712 Unilateral primary osteoarthritis, left knee: Secondary | ICD-10-CM

## 2021-01-08 MED ORDER — HYDROCODONE-ACETAMINOPHEN 5 MG-325 MG TAB
5-325 mg | ORAL_TABLET | Freq: Three times a day (TID) | ORAL | 0 refills | Status: DC | PRN
Start: 2021-01-08 — End: 2021-01-14

## 2021-01-08 NOTE — Progress Notes (Signed)
Progress Notes by Sharen Hones, PA-C at 01/08/21 0930                Author: Sharen Hones, PA-C  Service: --  Author Type: Physician Assistant       Filed: 01/08/21 1041  Encounter Date: 01/08/2021  Status: Signed          Editor: Everlena Cooper (Physician Assistant)                      [facility]   [address], [address]   [place] [address]   [phone]                   Patient: Cheryl Paul                MRN:  [id number]       SSN: [id number]   Date of Birth: 1951/01/13        AGE:  70 y.o.        SEX: female   Body mass index is 33.13 kg/m??.      PCP: Mable Fill, MD   01/08/21         This office note has been dictated.         REVIEW OF SYSTEMS:   Constitutional: Negative for fever, chills, weight loss and malaise/fatigue.    HENT: Negative.     Eyes: Negative.     Respiratory: Negative.    Cardiovascular: Negative.    Gastrointestinal: No bowel incontinence or constipation.   Genitourinary: No bladder incontinence or saddle anesthesia.   Skin: Negative.    Neurological: Negative.     Endo/Heme/Allergies: Negative.     Psychiatric/Behavioral: Negative.   Musculoskeletal: As per HPI above.         Past Medical History:        Diagnosis  Date         ?  Arthritis            knee, arms,          ?  Asthma            resolved after gastric bypass         ?  Chronic obstructive pulmonary disease (HCC)            no meds, since gastric bypass         ?  Chronic pain       ?  Diabetes (HCC)            no meds         ?  H/O sinusitis       ?  Hx SBO            multiple surgeries prior to 2002         ?  Hypercholesterolemia       ?  Hypertension       ?  Sleep apnea            NOT USING          ?  Stress incontinence       ?  Thrombocytopenia (HCC)       ?  Toe fracture, left  August 2015          4th toe              Current Outpatient Medications:    ?  aspirin delayed-release 81 mg tablet, Take 1 Tablet by mouth two (2) times a day.,  Disp: 60 Tablet, Rfl: 1   ?  docusate sodium (Colace) 100 mg capsule, Take 1 Capsule by mouth two (2) times a day for 90 days., Disp: 60 Capsule, Rfl: 2   ?  celecoxib (CeleBREX) 200 mg capsule, Take 1 Capsule by mouth two (2) times a day for 90 days. (Patient not taking: Reported on 11/22/2020),  Disp: 60 Capsule, Rfl: 2   ?  spironolactone (ALDACTONE) 25 mg tablet, Take 1 Tablet by mouth daily., Disp: 90 Tablet, Rfl: 1   ?  potassium chloride (K-DUR, KLOR-CON M20) 20 mEq tablet, Take 2 Tablets by mouth three (3) times daily., Disp: 270 Tablet, Rfl: 0   ?  nystatin (MYCOSTATIN) topical cream, Apply  to affected area two (2) times a day. (Patient taking differently: Apply 1 g to affected area  two (2) times daily as needed for Itching or Skin Irritation (under her breasts).), Disp: 15 g, Rfl: 1   ?  ergocalciferol (ERGOCALCIFEROL) 1,250 mcg (50,000 unit) capsule, Take 1 Capsule by mouth every seven (7) days., Disp: 12 Capsule, Rfl:  3   ?  magnesium oxide (MAG-OX) 400 mg tablet, Take 1 Tablet by mouth daily., Disp: 90 Tablet, Rfl: 0   ?  calcium citrate 200 mg (950 mg) tablet, Take 1 Tab by mouth daily. Indications: malabsorption, Disp: 30 Tab, Rfl: 0   ?  multivitamin (ONE A DAY) tablet, Take 1 Tab by mouth daily., Disp: , Rfl:    ?  thiamine HCL (B-1) 100 mg tablet, Take 100 mg by mouth daily., Disp: , Rfl:    ?  cyanocobalamin 1,000 mcg tablet, Take 1,000 mcg by mouth daily., Disp: , Rfl:         Allergies        Allergen  Reactions         ?  Latex  Rash     ?  Penicillins  Itching     ?  Acetaminophen-Codeine  Nausea Only and Other (comments)             Nausea and pains in stomach         ?  Butrans [Buprenorphine]  Rash     ?  Codeine  Other (comments)             Nausea and abd pain         ?  Iodine  Rash     ?  Lactose  Diarrhea             Lactose intolerance         ?  Other Plant, Educational psychologist, Environmental  Hives             Conductive electrodes             Social History          Socioeconomic History          ?  Marital status:  SINGLE              Spouse name:  Not on file         ?  Number of children:  Not on file     ?  Years of education:  Not on file     ?  Highest education level:  Not on file       Occupational History        ?  Not  on file       Tobacco Use         ?  Smoking status:  Never     ?  Smokeless tobacco:  Never       Substance and Sexual Activity         ?  Alcohol use:  No              Alcohol/week:  0.0 standard drinks         ?  Drug use:  No     ?  Sexual activity:  Not Currently        Other Topics  Concern        ?  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 Denies history of smoking           Social Determinants of Financial controllerHealth          Financial Resource Strain: Not on file     Food Insecurity: Not on file     Transportation Needs: Not on file     Physical Activity: Not on file     Stress: Not on file     Social Connections: Not on file     Intimate Partner Violence: Not on file       Housing Stability: Not on file             Past Surgical History:         Procedure  Laterality  Date          ?  HX CARPAL TUNNEL RELEASE  Bilateral       ?  HX CHOLECYSTECTOMY         ?  HX GYN         ?  HX HERNIA REPAIR              abdomen          ?  HX HYSTERECTOMY              tubes tied          ?  HX LAP GASTRIC BYPASS    02/21/2018     ?  HX ORTHOPAEDIC              catrpal tunnel right and left          ?  HX POLYPECTOMY    04/18/14     ?  PR ABDOMEN SURGERY PROC UNLISTED              4 surgeries for blockages              Patient seen evaluated today for her left total knee replacement.  She has now approximate 9-week status post surgery.  We have only seen her once for her staple removal.  She has been doing physical therapy however she been noncompliant working on arranging  tibs on her at home.  She did have a very stiff knee prior to surgery only bent about 85 degrees.  Her pain is well controlled.  She has had no troubles  the wound.    Patient denies recent fevers, chills, chest pain, SOB, or injuries.   No recent  systemic changes noted.  A 12-point review of systems is performed today.  Pertinent positives are noted.  All other systems reviewed and otherwise are negative.  Physical exam: General: Alert and oriented x3, nad.  well-developed, well nourished.   normal affect, AF.  NC/AT, EOMI, neck supple, trachea midline, no JVD present. Breathing is non-labored.  Examination of the left knee reveals skin intact.  There is no erythema ecchymosis noted.  There are no signs for infection or status present.   Range of motion is about -4-70 degrees.  Patella tracks nicely.  There are no defects noted in extensor mechanism.  Negative calf tenderness.  Negative Homans.  No signs of DVT present.    Assessment: Status post left total knee replacement, arthrofibrosis  left knee    Plan: At this point, we discussed treatment options.  I think should benefit from a knee manipulation under anesthesia.  This was discussed with her.  Alternatives, risk, complications, expected outcome.  She is instructed that she  will begin physical therapy afterwards.  She will continue with a home exercise program.  Refill pain medicine was sent to her pharmacy.  We will see her back in the office about 2 weeks after the manipulation.                  JR Auda Finfrock MPAS, PA-C, ATC

## 2021-01-09 NOTE — Interval H&P Note (Signed)
PRE-SURGICAL INSTRUCTIONS        Patient's Name:  Cheryl Paul      EHUDJ'S Date:  01/09/2021            Covid Testing Date and Time:    Surgery Date:  01/14/2021                Do NOT eat or drink anything, including candy, gum, or ice chips after midnight on 8/30, unless you have specific instructions from your surgeon or anesthesia provider to do so.  You may brush your teeth before coming to the hospital.  No smoking 24 hours prior to the day of surgery.  No alcohol 24 hours prior to the day of surgery.  No recreational drugs for one week prior to the day of surgery.  Leave all valuables, including money/purse, at home.  Remove all jewelry, nail polish, acrylic nails, and makeup (including mascara); no lotions powders, deodorant, or perfume/cologne/after shave on the skin.  Follow instruction for Hibiclens washes and CHG wipes from surgeon's office.   Glasses/contact lenses and dentures may be worn to the hospital.  They will be removed prior to surgery.  Call your doctor if symptoms of a cold or illness develop within 24-48 hours prior to your surgery.  11.  If you are having an outpatient procedure, please make arrangements for a responsible ADULT TO DRIVE YOU HOME AFTER SURGERY and stay with you for 24 hours after your surgery.  12.  ONE VISITOR in the hospital at this time for outpatient procedures.  Exceptions may be made for surgical admissions, per nursing unit guidelines      Special Instructions:      Bring list of CURRENT medications.    Bring any pertinent legal medical records.  Take these medications the morning of surgery with a sip of water:  per office        On the day of surgery, come in the main entrance of [facility].  Let the security guard at the desk know you are there for surgery.  A staff member will come escort you to the surgical area on the second floor.    If you have any questions or concerns, please do not hesitate to call:     (Prior to the day of surgery) PAT  department:  [phone]   (Day of surgery) Pre-Op department:  [phone]    These surgical instructions were reviewed with the patient during the PAT phone call.

## 2021-01-13 ENCOUNTER — Inpatient Hospital Stay: Admit: 2021-01-13 | Payer: MEDICARE | Primary: Family Medicine

## 2021-01-13 DIAGNOSIS — Z01818 Encounter for other preprocedural examination: Secondary | ICD-10-CM

## 2021-01-13 LAB — CBC WITH AUTO DIFFERENTIAL
Basophils %: 1 % (ref 0–2)
Basophils Absolute: 0.1 10*3/uL (ref 0.0–0.1)
Eosinophils %: 3 % (ref 0–5)
Eosinophils Absolute: 0.2 10*3/uL (ref 0.0–0.4)
Granulocyte Absolute Count: 0 10*3/uL (ref 0.00–0.04)
Hematocrit: 45 % (ref 35.0–45.0)
Hemoglobin: 13.6 g/dL (ref 12.0–16.0)
Immature Granulocytes: 1 % — ABNORMAL HIGH (ref 0.0–0.5)
Lymphocytes %: 28 % (ref 21–52)
Lymphocytes Absolute: 1.8 10*3/uL (ref 0.9–3.6)
MCH: 26.8 PG (ref 24.0–34.0)
MCHC: 30.2 g/dL — ABNORMAL LOW (ref 31.0–37.0)
MCV: 88.6 FL (ref 78.0–100.0)
MPV: 13.1 FL — ABNORMAL HIGH (ref 9.2–11.8)
Monocytes %: 5 % (ref 3–10)
Monocytes Absolute: 0.3 10*3/uL (ref 0.05–1.2)
NRBC Absolute: 0 10*3/uL (ref 0.00–0.01)
Neutrophils %: 62 % (ref 40–73)
Neutrophils Absolute: 3.9 10*3/uL (ref 1.8–8.0)
Nucleated RBCs: 0 PER 100 WBC
Platelets: 176 10*3/uL (ref 135–420)
RBC: 5.08 M/uL (ref 4.20–5.30)
RDW: 13.6 % (ref 11.6–14.5)
WBC: 6.3 10*3/uL (ref 4.6–13.2)

## 2021-01-13 LAB — BASIC METABOLIC PANEL
Anion Gap: 5 mmol/L (ref 3.0–18)
BUN: 11 MG/DL (ref 7.0–18)
Bun/Cre Ratio: 17 (ref 12–20)
CO2: 28 mmol/L (ref 21–32)
Calcium: 9.1 MG/DL (ref 8.5–10.1)
Chloride: 108 mmol/L (ref 100–111)
Creatinine: 0.65 MG/DL (ref 0.6–1.3)
EGFR IF NonAfrican American: >60 mL/min/{1.73_m2} (ref >60)
GFR African American: >60 mL/min/{1.73_m2} (ref >60)
Glucose: 188 mg/dL — ABNORMAL HIGH (ref 74–99)
Potassium: 3.9 mmol/L (ref 3.5–5.5)
Sodium: 141 mmol/L (ref 136–145)

## 2021-01-13 LAB — APTT: aPTT: 25.5 s (ref 23.0–36.4)

## 2021-01-13 LAB — PROTIME-INR
INR: 1 (ref 0.8–1.2)
Protime: 13.9 s (ref 11.5–15.2)

## 2021-01-13 LAB — CBC WITH AUTOMATED DIFF
ABS. BASOPHILS: 0.1 10*3/uL (ref 0.0–0.1)
ABS. EOSINOPHILS: 0.2 10*3/uL (ref 0.0–0.4)
ABS. IMM. GRANS.: 0 10*3/uL (ref 0.00–0.04)
ABS. LYMPHOCYTES: 1.8 10*3/uL (ref 0.9–3.6)
ABS. MONOCYTES: 0.3 10*3/uL (ref 0.05–1.2)
ABS. NEUTROPHILS: 3.9 10*3/uL (ref 1.8–8.0)
ABSOLUTE NRBC: 0 10*3/uL (ref 0.00–0.01)
BASOPHILS: 1 % (ref 0–2)
EOSINOPHILS: 3 % (ref 0–5)
HCT: 45 % (ref 35.0–45.0)
HGB: 13.6 g/dL (ref 12.0–16.0)
IMMATURE GRANULOCYTES: 1 % — ABNORMAL HIGH (ref 0.0–0.5)
LYMPHOCYTES: 28 % (ref 21–52)
MCH: 26.8 pg (ref 24.0–34.0)
MCHC: 30.2 g/dL — ABNORMAL LOW (ref 31.0–37.0)
MCV: 88.6 fL (ref 78.0–100.0)
MONOCYTES: 5 % (ref 3–10)
MPV: 13.1 FL — ABNORMAL HIGH (ref 9.2–11.8)
NEUTROPHILS: 62 % (ref 40–73)
NRBC: 0 /100{WBCs}
PLATELET: 176 10*3/uL (ref 135–420)
RBC: 5.08 M/uL (ref 4.20–5.30)
RDW: 13.6 % (ref 11.6–14.5)
WBC: 6.3 10*3/uL (ref 4.6–13.2)

## 2021-01-13 LAB — METABOLIC PANEL, BASIC
Anion gap: 5 mmol/L (ref 3.0–18)
BUN/Creatinine ratio: 17 (ref 12–20)
BUN: 11 MG/DL (ref 7.0–18)
CO2: 28 mmol/L (ref 21–32)
Calcium: 9.1 mg/dL (ref 8.5–10.1)
Chloride: 108 mmol/L (ref 100–111)
Creatinine: 0.65 mg/dL (ref 0.6–1.3)
GFR est AA: >60 mL/min/{1.73_m2} (ref >60)
GFR est non-AA: >60 mL/min/{1.73_m2} (ref >60)
Glucose: 188 mg/dL — ABNORMAL HIGH (ref 74–99)
Potassium: 3.9 mmol/L (ref 3.5–5.5)
Sodium: 141 mmol/L (ref 136–145)

## 2021-01-13 LAB — PROTHROMBIN TIME + INR
INR: 1 (ref 0.8–1.2)
Prothrombin time: 13.9 s (ref 11.5–15.2)

## 2021-01-13 LAB — PTT: aPTT: 25.5 s (ref 23.0–36.4)

## 2021-01-14 ENCOUNTER — Inpatient Hospital Stay: Payer: MEDICARE

## 2021-01-14 LAB — POCT GLUCOSE
POC Glucose: 102 mg/dL (ref 70–110)
POC Glucose: 92 mg/dL (ref 70–110)

## 2021-01-14 LAB — GLUCOSE, POC
Glucose (POC): 102 mg/dL (ref 70–110)
Glucose (POC): 92 mg/dL (ref 70–110)

## 2021-01-14 MED ORDER — EPHEDRINE SULFATE 50 MG/ML INTRAVENOUS SOLUTION
50 mg/mL | INTRAVENOUS | Status: AC
Start: 2021-01-14 — End: ?

## 2021-01-14 MED ORDER — MIDAZOLAM 1 MG/ML IJ SOLN
1 mg/mL | INTRAMUSCULAR | Status: DC | PRN
Start: 2021-01-14 — End: 2021-01-14
  Administered 2021-01-14: 14:00:00 via INTRAVENOUS

## 2021-01-14 MED ORDER — LIDOCAINE (PF) 20 MG/ML (2 %) IJ SOLN
20 mg/mL (2 %) | INTRAMUSCULAR | Status: DC | PRN
Start: 2021-01-14 — End: 2021-01-14
  Administered 2021-01-14: 14:00:00 via INTRAVENOUS

## 2021-01-14 MED ORDER — PROPOFOL 10 MG/ML IV EMUL
10 mg/mL | INTRAVENOUS | Status: DC | PRN
Start: 2021-01-14 — End: 2021-01-14
  Administered 2021-01-14: 14:00:00 via INTRAVENOUS

## 2021-01-14 MED ORDER — FENTANYL CITRATE (PF) 50 MCG/ML IJ SOLN
50 mcg/mL | INTRAMUSCULAR | Status: AC
Start: 2021-01-14 — End: ?

## 2021-01-14 MED ORDER — LACTATED RINGERS IV
INTRAVENOUS | Status: DC
Start: 2021-01-14 — End: 2021-01-14
  Administered 2021-01-14: 12:00:00 via INTRAVENOUS

## 2021-01-14 MED ORDER — MIDAZOLAM 1 MG/ML INJECTION (NDG ONLY)
1 mg/mL | INTRAMUSCULAR | Status: AC
Start: 2021-01-14 — End: ?

## 2021-01-14 MED ORDER — HYDROCODONE-ACETAMINOPHEN 7.5 MG-325 MG TAB
ORAL_TABLET | Freq: Four times a day (QID) | ORAL | 0 refills | Status: AC | PRN
Start: 2021-01-14 — End: 2021-01-21

## 2021-01-14 MED ORDER — INSULIN LISPRO 100 UNIT/ML INJECTION
100 unit/mL | Freq: Once | SUBCUTANEOUS | Status: DC
Start: 2021-01-14 — End: 2021-01-14

## 2021-01-14 MED ORDER — SUCCINYLCHOLINE CHLORIDE 20 MG/ML INJECTION
20 mg/mL | INTRAMUSCULAR | Status: DC | PRN
Start: 2021-01-14 — End: 2021-01-14
  Administered 2021-01-14: 14:00:00 via INTRAVENOUS

## 2021-01-14 MED ORDER — FENTANYL CITRATE (PF) 50 MCG/ML IJ SOLN
50 mcg/mL | INTRAMUSCULAR | Status: DC | PRN
Start: 2021-01-14 — End: 2021-01-14
  Administered 2021-01-14 (×2): via INTRAVENOUS

## 2021-01-14 MED ORDER — FAMOTIDINE 20 MG TAB
20 mg | Freq: Once | ORAL | Status: AC
Start: 2021-01-14 — End: 2021-01-14
  Administered 2021-01-14: 13:00:00 via ORAL

## 2021-01-14 MED FILL — LACTATED RINGERS IV: INTRAVENOUS | Qty: 1000

## 2021-01-14 MED FILL — MIDAZOLAM 1 MG/ML IJ SOLN: 1 mg/mL | INTRAMUSCULAR | Qty: 2

## 2021-01-14 MED FILL — EPHEDRINE SULFATE 50 MG/ML INTRAVENOUS SOLUTION: 50 mg/mL | INTRAVENOUS | Qty: 1

## 2021-01-14 MED FILL — FENTANYL CITRATE (PF) 50 MCG/ML IJ SOLN: 50 mcg/mL | INTRAMUSCULAR | Qty: 2

## 2021-01-14 MED FILL — FAMOTIDINE 20 MG TAB: 20 mg | ORAL | Qty: 1

## 2021-01-14 NOTE — Anesthesia Post-Procedure Evaluation (Signed)
Procedure(s):  LEFT KNEE MANIPULATION.    general    Anesthesia Post Evaluation      Multimodal analgesia: multimodal analgesia used between 6 hours prior to anesthesia start to PACU discharge  Patient location during evaluation: bedside  Patient participation: complete - patient participated  Level of consciousness: awake  Pain management: adequate  Airway patency: patent  Anesthetic complications: no  Cardiovascular status: stable  Respiratory status: acceptable  Hydration status: acceptable  Post anesthesia nausea and vomiting:  controlled      INITIAL Post-op Vital signs:   Vitals Value Taken Time   BP 127/58 01/14/21 1121   Temp 36.6 ??C (97.8 ??F) 01/14/21 1121   Pulse 66 01/14/21 1129   Resp 14 01/14/21 1129   SpO2 100 % 01/14/21 1129   Vitals shown include unvalidated device data.

## 2021-01-14 NOTE — Anesthesia Pre-Procedure Evaluation (Signed)
Relevant Problems   No relevant active problems       Anesthetic History   No history of anesthetic complications            Review of Systems / Medical History  Patient summary reviewed and pertinent labs reviewed    Pulmonary    COPD    Sleep apnea    Asthma        Neuro/Psych              Cardiovascular    Hypertension                Comments: Arthritis  knee, arms,    Asthma  resolved after gastric bypass   Chronic obstructive pulmonary disease (HCC)  no meds, since gastric bypass   Chronic pain     Diabetes (HCC)  no meds   H/O sinusitis     Hx SBO  multiple surgeries prior to 2002   Hypercholesterolemia     Hypertension     Sleep apnea  NOT USING    Stress incontinence     Thrombocytopenia (HCC)     Toe fracture, left          GI/Hepatic/Renal                Endo/Other    Diabetes: type 2    Arthritis     Other Findings              Physical Exam    Airway  Mallampati: II  TM Distance: 4 - 6 cm  Neck ROM: normal range of motion   Mouth opening: Normal     Cardiovascular    Rhythm: regular  Rate: normal         Dental    Dentition: Poor dentition     Pulmonary  Breath sounds clear to auscultation               Abdominal  GI exam deferred       Other Findings            Anesthetic Plan    ASA: 2  Anesthesia type: general          Induction: Intravenous  Anesthetic plan and risks discussed with: Patient

## 2021-01-14 NOTE — Op Note (Signed)
[facility]  OPERATIVE REPORT    Name:  Cheryl Paul, Cheryl Paul  MR#:   [id number]  DOB:  May 12, 1951  ACCOUNT #:  [id number]  DATE OF SERVICE:  01/14/2021      PREOPERATIVE DIAGNOSES:  Arthrofibrosis status left total knee replacement, also patient noncompliance.    POSTOPERATIVE DIAGNOSES:  Arthrofibrosis status left total knee replacement, also patient noncompliance.    PROCEDURE PERFORMED:  Gentle left knee manipulation under anesthesia.    SURGEON:  Gifford Shave, MD    ASSISTANT:  Merian Capron.    ANESTHETIST:  Dr. Modesto Charon.    ANESTHESIA:  General.    COMPLICATIONS:  No complications.    SPECIMENS REMOVED:  none.    IMPLANTS:  No implants.    ESTIMATED BLOOD LOSS:  No blood loss.    INDICATIONS:  The patient is a very nice lady who is noncompliant.  She came back to visit [place] only once 2 weeks after to get her staples out and has never returned since and has not been working hard at home and has not been attending physical therapy as much as we had asked her to do.    Having said this, she has healed up her wound nicely and her motion in the office was only about 40 degrees of flexion.    We had a very long and earnest discussion preoperatively regarding the seriousness of the problem that she will have to live with the stiff knee if she does not work harder and be more compliant in the future, and we do care very much about her and want her to do well.  All risks and benefits described with regards to the manipulation, and she elected to proceed.    DESCRIPTION OF PROCEDURE:  After the anesthetic was successfully induced, time-out was performed.  The knee was identified with the signature, and after the time-out, knee was examined.  She came out actually quite straight, and bending was 40% to 45% at most with full paralysis over the course of about 10 to 12 minutes.  We applied gentle and slow manipulative force on the knee with no complications, and we got her to bend to 90 degrees.  I was extremely pleased  with this.  There were no complications associated with the procedure, and the patient was awoken and taken to recovery room in a stable condition without complication.      Gifford Shave, MD      [id number]  D:  01/14/2021 10:42  T:  01/14/2021 22:25  JOB #:  [id number]

## 2021-01-22 ENCOUNTER — Ambulatory Visit: Attending: Family Medicine | Primary: Family Medicine

## 2021-01-22 ENCOUNTER — Ambulatory Visit: Admit: 2021-01-22 | Discharge: 2021-01-22 | Payer: MEDICARE | Attending: Family Medicine | Primary: Family Medicine

## 2021-01-22 DIAGNOSIS — I10 Essential (primary) hypertension: Secondary | ICD-10-CM

## 2021-01-22 NOTE — Progress Notes (Signed)
SUBJECTIVE  Ff-up    HTN- currently on aldactone 25 mg. Persistent Hypokalemia- endo eval neg for hyperaldo,  currently on kcl 80 meqs (2 tabs 20 meqs BID)  + Mg. Isolated ED episode of hyperK, has stopped nsaids with resolution.     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    preDM/DM-diet controlled, she does not check glucose at home. She is no longer on metformin.  a1c 5.7     Asthma ?COPD- non smoker, denies cough, sob, wheezing. She says last albuterol use was a year ago. Usually heat triggers respiratory symptoms.    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 Following dr. Meyer Cory      ROS:  See HPI, all others negative        Patient Active Problem List   Diagnosis Code    Chronic infection of sinus J32.9    Type 2 diabetes mellitus without complication (HCC) E11.9    Incontinence in female R32    Candidal intertrigo B37.2    Arthritis, multiple joint involvement M12.9    Essential hypertension I10    COPD (chronic obstructive pulmonary disease) (HCC) J44.9    Hyperlipidemia E78.5    Vitamin D deficiency E55.9    Internal hemorrhoids K64.8    Colon polyps K63.5    Hypokalemia E87.6    Hypomagnesemia E83.42    Syncope R55    Elevated troponin R77.8    Short gut syndrome K91.2    Hypocalcemia E83.51    Mild intermittent asthma J45.20    Thrombocytopenia, unspecified D69.6    Arthritis of knee M17.10    Non-compliance Z91.19    Arthrofibrosis of knee joint, 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       Current Outpatient Medications   Medication Sig Dispense Refill    aspirin delayed-release 81 mg tablet Take 1 Tablet by mouth two (2) times a day. 60 Tablet 1    docusate sodium (Colace) 100 mg capsule Take 1 Capsule by mouth two (2) times a day for 90 days. (Patient taking differently: Take 100 mg by mouth daily.) 60 Capsule 2    spironolactone (ALDACTONE) 25 mg tablet Take 1 Tablet by mouth daily.  90 Tablet 1    potassium chloride (K-DUR, KLOR-CON M20) 20 mEq tablet Take 2 Tablets by mouth three (3) times daily. (Patient taking differently: Take 40 mEq by mouth two (2) times a day.) 270 Tablet 0    nystatin (MYCOSTATIN) topical cream Apply  to affected area two (2) times a day. (Patient taking differently: Apply 1 g to affected area two (2) times daily as needed for Itching or Skin Irritation (under her breasts).) 15 g 1    ergocalciferol (ERGOCALCIFEROL) 1,250 mcg (50,000 unit) capsule Take 1 Capsule by mouth every seven (7) days. 12 Capsule 3    magnesium oxide (MAG-OX) 400 mg tablet Take 1 Tablet by mouth daily. 90 Tablet 0    calcium citrate 200 mg (950 mg) tablet Take 1 Tab by mouth daily. Indications: malabsorption 30 Tab 0    multivitamin (ONE A DAY) tablet Take 1 Tab by mouth daily.      thiamine HCL (B-1) 100 mg tablet Take 100 mg by mouth daily.      cyanocobalamin 1,000 mcg tablet Take 1,000 mcg by mouth daily.         Allergies   Allergen Reactions    Latex Rash    Acetaminophen-Codeine Nausea  Only and Other (comments)     Nausea and pains in stomach    Codeine Other (comments)     Nausea and abd pain    Butrans [Buprenorphine] Rash    Iodine Rash    Lactose Diarrhea     Lactose intolerance    Other Plant, Educational psychologist, Environmental Hives     Conductive electrodes    Penicillins Itching       Past Medical History:   Diagnosis Date    Arthritis     knee, arms,     Asthma     resolved after gastric bypass    Chronic obstructive pulmonary disease (HCC)     no meds, since gastric bypass    Chronic pain     Diabetes (HCC)     no meds    H/O sinusitis     Hx SBO     multiple surgeries prior to 2002    Hypercholesterolemia     Hypertension     Sleep apnea     NOT USING     Stress incontinence     Thrombocytopenia (HCC)     Toe fracture, left August 2015    4th toe       Social History     Socioeconomic History    Marital status: SINGLE     Spouse name: Not on file    Number of children: Not on file    Years  of education: Not on file    Highest education level: Not on file   Occupational History    Not on file   Tobacco Use    Smoking status: Never    Smokeless tobacco: Never   Substance and Sexual Activity    Alcohol use: No     Alcohol/week: 0.0 standard drinks    Drug use: No    Sexual activity: Not Currently   Other Topics Concern    Not on file   Social History Narrative    Retired Psychologist, occupational, 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 Denies history of smoking      Social Determinants of Psychologist, prison and probation services Strain: Not on file   Food Insecurity: Not on file   Transportation Needs: Not on file   Physical Activity: Not on file   Stress: Not on file   Social Connections: Not on file   Intimate Partner Violence: Not on file   Housing Stability: Not on file       Family History   Problem Relation Age of Onset    Hypertension Mother     Stroke Mother     Diabetes Mother     Thyroid Disease Mother     Arthritis-rheumatoid Mother     Cancer Father         colon polpe    Cancer Maternal Aunt          OBJECTIVE    Physical Exam:     Visit Vitals  BP 124/74 (BP 1 Location: Left arm, BP Patient Position: Sitting, BP Cuff Size: Large adult)   Pulse 69   Temp 97.9 ??F (36.6 ??C) (Temporal)   Resp 16   Ht 5\' 3"  (1.6 m)   Wt 191 lb (86.6 kg)   SpO2 97%   BMI 33.83 kg/m??       General: alert, well-appearing,obese,AA, in no apparent distress or pain  Neck: supple, no adenopathy palpated  CVS: normal rate, regular rhythm, distinct S1 and S2  Lungs:clear to ausculation  bilaterally, no crackles, wheezing or rhonchi noted  Abdomen: normoactive bowel sounds, soft, non-tender  Psych:  mood and affect normal  Results for orders placed or performed during the hospital encounter of 01/14/21   GLUCOSE, POC   Result Value Ref Range    Glucose (POC) 102 70 - 110 mg/dL   GLUCOSE, POC   Result Value Ref Range    Glucose (POC) 92 70 - 110 mg/dL           ASSESSMENT/PLAN  Diagnoses and all orders for this visit:     Essential  hypertension  controlled  w/ hypoKalemia issues  Cont aldactone 25 mg  Cont Kcl 80 meqs/day (2 tabs 20 meqs BID)  DASH diet, BP Log  CMP prior to next visit     Diabetes mellitus type 2, diet-controlled (HCC)  A1c 5.7  Off meds after gastric bypass 10/19  Monitoring   Check a1c/cmp  prior to next visit     Mild intermittent asthma, unspecified whether complicated  Controlled  Cont prn albuterol     Pure hypercholesterolemia   off statin after wt loss/gastric bypass 2019  Monitoring     Chronic obstructive pulmonary disease, unspecified COPD type (HCC)  Unclear h/o, nonsmoker  pt reports recent pulm clearance prior to gastric bypass ?[facility]  Cont prn albuterol    BMI 33  S/p gastric bypass    Vitamin D deficiency   s/p gastric bypass  Cont 50 k weekly  Monitoring    S/p gastric bypass  Cont MVT + fe    Hypokalemia  Now stable  Cont mg 400 mg every day/kcl 80 meqs daily, plus aldactone      Ff-up in 3 months  Patient understands plan of care. Patient has provided input and agrees with goals.

## 2021-01-23 NOTE — Progress Notes (Signed)
 Per [facility] requirements;  E-mail and letter sent for follow up appointment.     [facility]  [facility]  [facility]      Dear Patient,    Your health is our main concern. It is important for your health to have follow-up lab work and to see your surgeon at 3 months, 6 months and annually after your weight loss surgery.          Additionally, the Department of bariatric Surgery at our hospital is a member of the Metabolic and Bariatric Surgery Accreditation and Quality Improvement Program [facility]).   As a participant in this program, we gather information on the outcomes of our patients after surgery.    Please call the office for a follow up appointment at [phone] [facility]) or [phone] [facility]).     If you have moved out of the area or have changed surgeons please call us and let us know the name of your doctor.    Your health and feedback are important to [place].  We greatly appreciate your response.       Thank you,  [facility]  [employer]

## 2021-01-29 ENCOUNTER — Ambulatory Visit: Attending: Physician Assistant | Primary: Family Medicine

## 2021-01-29 ENCOUNTER — Ambulatory Visit: Admit: 2021-01-29 | Discharge: 2021-01-29 | Payer: MEDICARE | Attending: Physician Assistant | Primary: Family Medicine

## 2021-01-29 DIAGNOSIS — G8918 Other acute postprocedural pain: Secondary | ICD-10-CM

## 2021-01-29 MED ORDER — CLINDAMYCIN 300 MG CAP
300 mg | ORAL_CAPSULE | ORAL | 2 refills | Status: AC
Start: 2021-01-29 — End: ?

## 2021-01-29 MED ORDER — OXYCODONE-ACETAMINOPHEN 7.5 MG-325 MG TAB
ORAL_TABLET | Freq: Three times a day (TID) | ORAL | 0 refills | Status: AC | PRN
Start: 2021-01-29 — End: 2021-02-05

## 2021-01-29 NOTE — Progress Notes (Signed)
Progress Notes by Sharen Hones, PA-C at 01/29/21 1010                Author: Sharen Hones, PA-C  Service: --  Author Type: Physician Assistant       Filed: 01/29/21 1112  Encounter Date: 01/29/2021  Status: Signed          Editor: Everlena Cooper (Physician Assistant)                      [facility]   [address]   [place] [address]   [phone]                   Patient: Cheryl Paul                MRN:  [id number]       SSN: [id number]   Date of Birth: Jul 31, 1950        AGE:  70 y.o.        SEX: female   Body mass index is 33.66 kg/m??.      PCP: Mable Fill, MD   01/29/21         This office note has been dictated.         REVIEW OF SYSTEMS:   Constitutional: Negative for fever, chills, weight loss and malaise/fatigue.    HENT: Negative.     Eyes: Negative.     Respiratory: Negative.    Cardiovascular: Negative.    Gastrointestinal: No bowel incontinence or constipation.   Genitourinary: No bladder incontinence or saddle anesthesia.   Skin: Negative.    Neurological: Negative.     Endo/Heme/Allergies: Negative.     Psychiatric/Behavioral: Negative.   Musculoskeletal: As per HPI above.         Past Medical History:        Diagnosis  Date         ?  Arthritis            knee, arms,          ?  Asthma            resolved after gastric bypass         ?  Chronic obstructive pulmonary disease (HCC)            no meds, since gastric bypass         ?  Chronic pain       ?  Diabetes (HCC)            no meds         ?  H/O sinusitis       ?  Hx SBO            multiple surgeries prior to 2002         ?  Hypercholesterolemia       ?  Hypertension       ?  Sleep apnea            NOT USING          ?  Stress incontinence       ?  Thrombocytopenia (HCC)       ?  Toe fracture, left  August 2015          4th toe              Current Outpatient Medications:    ?  aspirin delayed-release 81 mg tablet, Take 1 Tablet by mouth two (2) times a day.,  Disp: 60 Tablet, Rfl: 1   ?  spironolactone (ALDACTONE) 25 mg tablet, Take 1 Tablet by mouth daily., Disp: 90 Tablet, Rfl: 1   ?  potassium chloride (K-DUR, KLOR-CON M20) 20 mEq tablet, Take 2 Tablets by mouth three (3) times daily. (Patient taking differently: Take  40 mEq by mouth two (2) times a day.), Disp: 270 Tablet, Rfl: 0   ?  nystatin (MYCOSTATIN) topical cream, Apply  to affected area two (2) times a day. (Patient taking differently: Apply 1 g to affected area  two (2) times daily as needed for Itching or Skin Irritation (under her breasts).), Disp: 15 g, Rfl: 1   ?  ergocalciferol (ERGOCALCIFEROL) 1,250 mcg (50,000 unit) capsule, Take 1 Capsule by mouth every seven (7) days., Disp: 12 Capsule, Rfl:  3   ?  magnesium oxide (MAG-OX) 400 mg tablet, Take 1 Tablet by mouth daily., Disp: 90 Tablet, Rfl: 0   ?  calcium citrate 200 mg (950 mg) tablet, Take 1 Tab by mouth daily. Indications: malabsorption, Disp: 30 Tab, Rfl: 0   ?  multivitamin (ONE A DAY) tablet, Take 1 Tab by mouth daily., Disp: , Rfl:    ?  thiamine HCL (B-1) 100 mg tablet, Take 100 mg by mouth daily., Disp: , Rfl:    ?  cyanocobalamin 1,000 mcg tablet, Take 1,000 mcg by mouth daily., Disp: , Rfl:         Allergies        Allergen  Reactions         ?  Latex  Rash     ?  Acetaminophen-Codeine  Nausea Only and Other (comments)             Nausea and pains in stomach         ?  Codeine  Other (comments)             Nausea and abd pain         ?  Butrans [Buprenorphine]  Rash     ?  Iodine  Rash     ?  Lactose  Diarrhea             Lactose intolerance         ?  Other Plant, Educational psychologist, Environmental  Hives             Conductive electrodes         ?  Penicillins  Itching             Social History          Socioeconomic History         ?  Marital status:  SINGLE              Spouse name:  Not on file         ?  Number of children:  Not on file     ?  Years of education:  Not on file     ?  Highest education level:  Not on file       Occupational  History        ?  Not on file       Tobacco Use         ?  Smoking status:  Never     ?  Smokeless tobacco:  Never       Substance and Sexual Activity         ?  Alcohol use:  No              Alcohol/week:  0.0 standard drinks         ?  Drug use:  No     ?  Sexual activity:  Not Currently        Other Topics  Concern        ?  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 Denies history of smoking           Social Determinants of Financial controller Strain: Not on file     Food Insecurity: Not on file     Transportation Needs: Not on file     Physical Activity: Not on file     Stress: Not on file     Social Connections: Not on file     Intimate Partner Violence: Not on file       Housing Stability: Not on file             Past Surgical History:         Procedure  Laterality  Date          ?  HX CARPAL TUNNEL RELEASE  Bilateral       ?  HX CHOLECYSTECTOMY         ?  HX GYN         ?  HX HERNIA REPAIR              abdomen          ?  HX HYSTERECTOMY              tubes tied          ?  HX LAP GASTRIC BYPASS    02/21/2018     ?  HX ORTHOPAEDIC              catrpal tunnel right and left          ?  HX POLYPECTOMY    04/18/2014     ?  PR ABDOMEN SURGERY PROC UNLISTED              4 surgeries for blockages              Patient seen evaluated today for her left knee.  She is status post left total replacement surgery.  She had a subsequent manipulation under anesthesia.  She states the knee is feeling better.  Still some soreness.  She does continue with physical therapy.   She has been working on range of motion activities on her own.  She requesting a refill of pain medicine.  She has no start up pain.  No feelings of instability.  No recent injuries or falls to report.    Patient denies recent fevers, chills,  chest pain, SOB, or injuries.   No recent systemic changes noted.  A 12-point review of systems is performed today.   Pertinent positives are noted.  All other systems reviewed and otherwise are negative.    Physical exam: General: Alert and  oriented x3, nad.  well-developed, well nourished.  normal affect, AF.  NC/AT, EOMI, neck supple, trachea midline, no JVD present. Breathing is non-labored.  Examination of left knee reveals skin intact.  The surgical wound is healed nicely.  There  is no erythema or ecchymosis noted.  There are no signs for infection or cellulitis present.  Range of motion is -2-85 degrees.  Patella tracks nicely without rubs or crepitus noted.  Negative calf tenderness.  Negative Homans.  No signs of DVT present.     Assessment: Status post left total knee replacement, arthrofibrosis left knee    Plan: At this point, the patient will continue with outpatient physical therapy.  She will continue with a home exercise program and range of motion activities on  an hourly basis.  Refill of pain medicine will be sent to her pharmacy.  A prescription for Amoxil also be sent for her upcoming dental appointment.  We will see her back in 4 weeks time for evaluation and range of motion check.                     JR Micaylah Bertucci MPAS, PA-C, ATC

## 2021-02-28 ENCOUNTER — Telehealth

## 2021-02-28 MED ORDER — HYDROCODONE-ACETAMINOPHEN 7.5 MG-325 MG TAB
ORAL_TABLET | Freq: Four times a day (QID) | ORAL | 0 refills | Status: AC | PRN
Start: 2021-02-28 — End: 2021-03-07

## 2021-02-28 NOTE — Telephone Encounter (Signed)
Patient called in to request a refill on medication (Oxycodone).

## 2021-02-28 NOTE — Telephone Encounter (Signed)
Rx for norco done and sent to pharmacy

## 2021-03-13 ENCOUNTER — Ambulatory Visit: Attending: Physician Assistant | Primary: Family Medicine

## 2021-03-13 DIAGNOSIS — Z9889 Other specified postprocedural states: Secondary | ICD-10-CM

## 2021-03-13 MED ORDER — MELOXICAM 15 MG TAB
15 mg | ORAL_TABLET | Freq: Every day | ORAL | 2 refills | Status: DC
Start: 2021-03-13 — End: 2021-04-23

## 2021-03-13 MED ORDER — HYDROCODONE-ACETAMINOPHEN 7.5 MG-325 MG TAB
ORAL_TABLET | Freq: Three times a day (TID) | ORAL | 0 refills | Status: AC | PRN
Start: 2021-03-13 — End: 2021-03-20

## 2021-03-13 NOTE — Progress Notes (Signed)
Progress Notes by Sharen Hones, PA-C at 03/13/21 1610                Author: Sharen Hones, PA-C  Service: --  Author Type: Physician Assistant       Filed: 03/13/21 [phone]  Encounter Date: 03/13/2021  Status: Signed          Editor: Everlena Cooper (Physician Assistant)                      [facility]   [address], [address]   [place] [address]   [phone]                   Patient: Cheryl Paul                MRN:  [id number]       SSN: [id number]   Date of Birth: 09/11/1950        AGE:  70 y.o.        SEX: female   There is no height or weight on file to calculate BMI.      PCP: Mable Fill, MD   03/13/21         This office note has been dictated.         REVIEW OF SYSTEMS:   Constitutional: Negative for fever, chills, weight loss and malaise/fatigue.    HENT: Negative.     Eyes: Negative.     Respiratory: Negative.    Cardiovascular: Negative.    Gastrointestinal: No bowel incontinence or constipation.   Genitourinary: No bladder incontinence or saddle anesthesia.   Skin: Negative.    Neurological: Negative.     Endo/Heme/Allergies: Negative.     Psychiatric/Behavioral: Negative.   Musculoskeletal: As per HPI above.         Past Medical History:        Diagnosis  Date         ?  Arthritis            knee, arms,          ?  Asthma            resolved after gastric bypass         ?  Chronic obstructive pulmonary disease (HCC)            no meds, since gastric bypass         ?  Chronic pain       ?  Diabetes (HCC)            no meds         ?  H/O sinusitis       ?  Hx SBO            multiple surgeries prior to 2002         ?  Hypercholesterolemia       ?  Hypertension       ?  Sleep apnea            NOT USING          ?  Stress incontinence       ?  Thrombocytopenia (HCC)       ?  Toe fracture, left  August 2015          4th toe              Current Outpatient  Medications:    ?  clindamycin (CLEOCIN) 300 mg capsule, 3 po one hour prior to  dental work., Disp: 9 Capsule, Rfl: 2   ?  aspirin delayed-release 81 mg tablet, Take 1 Tablet by mouth two (2) times a day., Disp: 60 Tablet, Rfl: 1   ?  spironolactone (ALDACTONE) 25 mg tablet, Take 1 Tablet by mouth daily., Disp: 90 Tablet, Rfl: 1   ?  potassium chloride (K-DUR, KLOR-CON M20) 20 mEq tablet, Take 2 Tablets by mouth three (3) times daily. (Patient taking differently: Take  40 mEq by mouth two (2) times a day.), Disp: 270 Tablet, Rfl: 0   ?  nystatin (MYCOSTATIN) topical cream, Apply  to affected area two (2) times a day. (Patient taking differently: Apply 1 g to affected area  two (2) times daily as needed for Itching or Skin Irritation (under her breasts).), Disp: 15 g, Rfl: 1   ?  ergocalciferol (ERGOCALCIFEROL) 1,250 mcg (50,000 unit) capsule, Take 1 Capsule by mouth every seven (7) days., Disp: 12 Capsule, Rfl:  3   ?  magnesium oxide (MAG-OX) 400 mg tablet, Take 1 Tablet by mouth daily., Disp: 90 Tablet, Rfl: 0   ?  calcium citrate 200 mg (950 mg) tablet, Take 1 Tab by mouth daily. Indications: malabsorption, Disp: 30 Tab, Rfl: 0   ?  multivitamin (ONE A DAY) tablet, Take 1 Tab by mouth daily., Disp: , Rfl:    ?  thiamine HCL (B-1) 100 mg tablet, Take 100 mg by mouth daily., Disp: , Rfl:    ?  cyanocobalamin 1,000 mcg tablet, Take 1,000 mcg by mouth daily., Disp: , Rfl:         Allergies        Allergen  Reactions         ?  Latex  Rash     ?  Acetaminophen-Codeine  Nausea Only and Other (comments)             Nausea and pains in stomach         ?  Codeine  Other (comments)             Nausea and abd pain         ?  Butrans [Buprenorphine]  Rash     ?  Iodine  Rash     ?  Lactose  Diarrhea             Lactose intolerance         ?  Other Plant, Educational psychologist, Environmental  Hives             Conductive electrodes         ?  Penicillins  Itching             Social History          Socioeconomic History         ?  Marital status:  SINGLE              Spouse name:  Not on file         ?  Number of  children:  Not on file     ?  Years of education:  Not on file     ?  Highest education level:  Not on file       Occupational History        ?  Not on file       Tobacco Use         ?  Smoking status:  Never     ?  Smokeless tobacco:  Never       Substance and Sexual Activity         ?  Alcohol use:  No              Alcohol/week:  0.0 standard drinks         ?  Drug use:  No     ?  Sexual activity:  Not Currently        Other Topics  Concern        ?  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 Denies history of smoking           Social Determinants of Financial controller Strain: Not on file     Food Insecurity: Not on file     Transportation Needs: Not on file     Physical Activity: Not on file     Stress: Not on file     Social Connections: Not on file     Intimate Partner Violence: Not on file       Housing Stability: Not on file             Past Surgical History:         Procedure  Laterality  Date          ?  HX CARPAL TUNNEL RELEASE  Bilateral       ?  HX CHOLECYSTECTOMY         ?  HX GYN         ?  HX HERNIA REPAIR              abdomen          ?  HX HYSTERECTOMY              tubes tied          ?  HX LAP GASTRIC BYPASS    02/21/2018     ?  HX ORTHOPAEDIC              catrpal tunnel right and left          ?  HX POLYPECTOMY    04/18/2014     ?  PR ABDOMEN SURGERY PROC UNLISTED              4 surgeries for blockages              Patient seen evaluated today for her left knee.  She is about 4 months status post left total knee replacement surgery.  She did require manipulation under anesthesia due to noncompliance.  The patient has been in physical therapy.  She states she is  working on her range of motion activities on her own however not on an hourly basis.  She reports some stiffness in her knee.  She is currently taking pain medicine however no anti-inflammatory.    Patient denies recent fevers, chills, chest pain,   SOB, or injuries.   No recent systemic changes noted.  A 12-point review of systems is performed today.  Pertinent positives are noted.  All other systems reviewed and otherwise are negative.    Physical exam: General: Alert and oriented x3,  nad.  well-developed, well nourished.  normal affect, AF.  NC/AT,  EOMI, neck supple, trachea midline, no JVD present. Breathing is non-labored.  Examination of the lower extremities reveals pain-free range of motion the hips.  The left knee reveals  skin intact.  The surgical wound is healed nicely.  There is no erythema or ecchymosis noted.  There are no signs for infection or cellulitis present.  Range of motion is -6-90 degrees.  Patella tracks nicely.  There is no rubs or crepitus noted.  She  has negative calf tenderness.  Negative Homans.  No signs of DVT present.    Assessment: Status post left total knee replacement, status post manipulation under anesthesia left knee    Plan: At this point, we discussed treatment options.   We will continue with physical therapy.  I think she will benefit from this.  She is instructed in the importance of range of motion activities on an hourly basis at home.  Refill of pain medicine as well as a prescription of Mobic was sent to her pharmacy.   She is instructed on use as well as usual cautions.  She will continue with ice therapy.  We will see her back in the office in about 4 weeks time for evaluation.  She will call with any questions or concerns that should arise.                     JR Kadeem Hyle MPAS, PA-C, ATC

## 2021-03-18 ENCOUNTER — Inpatient Hospital Stay: Payer: Medicare HMO

## 2021-03-18 ENCOUNTER — Other Ambulatory Visit: Payer: Self-pay

## 2021-03-18 ENCOUNTER — Encounter: Payer: Self-pay | Admitting: Oncology

## 2021-03-18 ENCOUNTER — Inpatient Hospital Stay: Payer: Medicare HMO | Attending: Oncology | Admitting: Oncology

## 2021-03-18 VITALS — BP 90/59 | HR 82 | Temp 98.0°F | Resp 18 | Wt 140.2 lb

## 2021-03-18 DIAGNOSIS — R58 Hemorrhage, not elsewhere classified: Secondary | ICD-10-CM | POA: Insufficient documentation

## 2021-03-18 DIAGNOSIS — D500 Iron deficiency anemia secondary to blood loss (chronic): Secondary | ICD-10-CM | POA: Insufficient documentation

## 2021-03-18 DIAGNOSIS — E039 Hypothyroidism, unspecified: Secondary | ICD-10-CM | POA: Insufficient documentation

## 2021-03-18 MED ORDER — FERROUS SULFATE 325 (65 FE) MG PO TBEC
325.0000 mg | DELAYED_RELEASE_TABLET | Freq: Two times a day (BID) | ORAL | 3 refills | Status: DC
Start: 2021-03-18 — End: 2021-05-22

## 2021-03-18 NOTE — Progress Notes (Signed)
Hematology/Oncology Consult note [facility] [phone] [phone]   Patient Care Team: Derinda Late, MD as PCP - General (Family Medicine)  REFERRING PROVIDER: Derinda Late, MD  CHIEF COMPLAINTS/REASON FOR VISIT:  Evaluation of anemia  HISTORY OF PRESENTING ILLNESS:   Brittany [name] is a  70 y.o.  female with PMH listed below was seen in consultation at the request of  Derinda Late, MD  for evaluation of anemia Patient had blood work done at primary care provider's office.  02/28/2021, hemoglobin 9, MCV 86.4, normal WBC.  Normal platelet count. 03/07/2021 TIBC 480, iron saturation 9, ferritin 8.  Vitamin B12 566.  TSH 1.855 Reviewed patient's previous blood work via care everywhere as well as within our current EMR. Anemia is chronic, dated back to 2014. Denies any black or bloody stool.  Denies unintentional weight loss, abdominal pain. + Acid reflux  08/05/2020, upper endoscopy showed gastritis, partial fundoplication was found.  The wrap appears loose. Colonoscopy 10/24/2018 small polyps removed.  Internal hemorrhoids. she has previously tried oral iron supplementation.  Currently she is not on any iron treatment. She feels tired and fatigued.   Review of Systems  Constitutional:  Negative for appetite change, chills, fatigue and fever.  HENT:   Negative for hearing loss and voice change.   Eyes:  Negative for eye problems.  Respiratory:  Negative for chest tightness and cough.   Cardiovascular:  Negative for chest pain.  Gastrointestinal:  Negative for abdominal distention, abdominal pain and blood in stool.       Acid reflux  Endocrine: Negative for hot flashes.  Genitourinary:  Negative for difficulty urinating and frequency.   Musculoskeletal:  Negative for arthralgias.  Skin:  Negative for itching and rash.  Neurological:  Negative for extremity weakness.  Hematological:  Negative for adenopathy.   Psychiatric/Behavioral:  Negative for confusion.    MEDICAL HISTORY:  Past Medical History:  Diagnosis Date   Allergic genetic state    Anemia    Anxiety    Arthritis    Cataract    Chicken pox    Complication of anesthesia    Depression    Family history of adverse reaction to anesthesia    mother - PONV   GERD (gastroesophageal reflux disease)    H/O hiatal hernia    History of GI bleed    Hypercholesteremia    Hyperlipidemia    Hypothyroidism    Osteoporosis    PONV (postoperative nausea and vomiting)    Stroke ([place]) 11/2017   Thyroid disease    Wears dentures    partial upper, full lower    SURGICAL HISTORY: Past Surgical History:  Procedure Laterality Date   ABDOMINAL HYSTERECTOMY  05/18/2000   total   BRAVO [place] STUDY  05/26/2012   Procedure: BRAVO [place];  Surgeon: Lear Ng, MD;  Location: [facility] ENDOSCOPY;  Service: Endoscopy;  Laterality: N/A;   CHOLECYSTECTOMY  05/18/2004   COLONOSCOPY WITH PROPOFOL N/A 10/24/2018   Procedure: COLONOSCOPY WITH PROPOFOL;  Surgeon: Manya Silvas, MD;  Location: [facility] ENDOSCOPY;  Service: Endoscopy;  Laterality: N/A;   detatched retina surgery     ESOPHAGOGASTRODUODENOSCOPY  05/26/2012   Procedure: ESOPHAGOGASTRODUODENOSCOPY (EGD);  Surgeon: Lear Ng, MD;  Location: [facility];  Service: Endoscopy;  Laterality: N/A;   ESOPHAGOGASTRODUODENOSCOPY  05/30/2012   Procedure: ESOPHAGOGASTRODUODENOSCOPY (EGD);  Surgeon: Cleotis Nipper, MD;  Location: [facility] ENDOSCOPY;  Service: Endoscopy;  Laterality: N/A;   ESOPHAGOGASTRODUODENOSCOPY N/A 08/31/2012   Procedure:  ESOPHAGOGASTRODUODENOSCOPY (EGD);  Surgeon: Wonda Horner, MD;  Location: [facility] ENDOSCOPY;  Service: Endoscopy;  Laterality: N/A;   ESOPHAGOGASTRODUODENOSCOPY (EGD) WITH PROPOFOL N/A 10/24/2018   Procedure: ESOPHAGOGASTRODUODENOSCOPY (EGD) WITH PROPOFOL;  Surgeon: Manya Silvas, MD;  Location: [facility] ENDOSCOPY;  Service: Endoscopy;  Laterality: N/A;    ESOPHAGOGASTRODUODENOSCOPY (EGD) WITH PROPOFOL N/A 08/05/2020   Procedure: ESOPHAGOGASTRODUODENOSCOPY (EGD) WITH PROPOFOL;  Surgeon: Tol[place], Benay Pike, MD;  Location: [facility] ENDOSCOPY;  Service: Gastroenterology;  Laterality: N/A;   EYE SURGERY     HIP SURGERY  1970s   "hip sunk in"   NECK SURGERY  05/18/2010   PLANTAR FASCIA RELEASE Left 10/10/2020   Procedure: ENDOSCOPIC PLANTAR FASC. RELEASE;  Surgeon: [name], DPM;  Location: [place];  Service: Podiatry;  Laterality: Left;  ANESTHESIA- CHOICE   ROTATOR CUFF REPAIR  03/18/2012   right side   THYROID SURGERY     nodule removed, right side   TMJ ARTHROPLASTY  05/18/1990   TUBAL LIGATION  05/19/1979    SOCIAL HISTORY: Social History   Socioeconomic History   Marital status: Divorced    Spouse name: Not on file   Number of children: Not on file   Years of education: Not on file   Highest education level: Not on file  Occupational History   Not on file  Tobacco Use   Smoking status: Former    Packs/day: 1.50    Years: 20.00    Pack years: 30.00    Types: Cigarettes    Quit date: 02/05/2008    Years since quitting: 13.1   Smokeless tobacco: Never  Vaping Use   Vaping Use: Never used  Substance and Sexual Activity   Alcohol use: No   Drug use: No   Sexual activity: Not on file  Other Topics Concern   Not on file  Social History Narrative   Not on file   Social Determinants of Health   Financial Resource Strain: Not on file  Food Insecurity: Not on file  Transportation Needs: Not on file  Physical Activity: Not on file  Stress: Not on file  Social Connections: Not on file  Intimate Partner Violence: Not on file    FAMILY HISTORY: Family History  Problem Relation Age of Onset   Hypertension Mother    Kidney disease Mother    Heart attack Mother    Osteoporosis Mother    Lung cancer Father    Cancer Sister    Leukemia Sister    Colon polyps Sister    Ovarian cancer Paternal Aunt    Ovarian  cancer Maternal Grandmother     ALLERGIES:  is allergic to lithium, fentanyl, morphine, and pseudoephedrine.  MEDICATIONS:  Current Outpatient Medications  Medication Sig Dispense Refill   atorvastatin (LIPITOR) 40 MG tablet Take 1 tablet (40 mg total) by mouth daily. 30 tablet 0   betamethasone valerate lotion (VALISONE) 0.1 % Apply 1 application topically daily.     Bromfenac Sodium (PROLENSA OP) Apply to eye 3 (three) times a week.     CHOLECALCIFEROL PO Take 2,000 Units by mouth daily.     citalopram (CELEXA) 20 MG tablet Take 20 mg by mouth daily.     clorazepate (TRANXENE) 7.5 MG tablet Take 7.5 mg by mouth See admin instructions. Take 7.5 mg by mouth in the morning and at bedtime. May take an additional during the day if needed.     ferrous sulfate 325 (65 FE) MG EC tablet Take 1 tablet (325 mg total)  by mouth 2 (two) times daily with a meal. 60 tablet 3   levothyroxine (SYNTHROID, LEVOTHROID) 25 MCG tablet Take 25 mcg by mouth every morning.      montelukast (SINGULAIR) 10 MG tablet Take 10 mg by mouth at bedtime.     ondansetron (ZOFRAN) 4 MG tablet Take 1 tablet (4 mg total) by mouth every 8 (eight) hours as needed for nausea or vomiting. 20 tablet 0   traZODone (DESYREL) 50 MG tablet Take 50 mg by mouth at bedtime.     vitamin B-12 (CYANOCOBALAMIN) 1000 MCG tablet Take 1,000 mcg by mouth daily.     No current facility-administered medications for this visit.     PHYSICAL EXAMINATION: ECOG PERFORMANCE STATUS: 1 - Symptomatic but completely ambulatory Vitals:   03/18/21 1452  BP: (!) 90/59  Pulse: 82  Resp: 18  Temp: 98 F (36.7 C)   Filed Weights   03/18/21 1452  Weight: 140 lb 3.2 oz (63.6 kg)    Physical Exam Constitutional:      General: She is not in acute distress. HENT:     Head: Normocephalic and atraumatic.  Eyes:     General: No scleral icterus. Cardiovascular:     Rate and Rhythm: Normal rate and regular rhythm.     Heart sounds: Normal heart  sounds.  Pulmonary:     Effort: Pulmonary effort is normal. No respiratory distress.     Breath sounds: No wheezing.  Abdominal:     General: Bowel sounds are normal. There is no distension.     Palpations: Abdomen is soft.  Musculoskeletal:        General: No deformity. Normal range of motion.     Cervical back: Normal range of motion and neck supple.  Skin:    General: Skin is warm and dry.     Findings: No erythema or rash.  Neurological:     Mental Status: She is alert and oriented to person, place, and time. Mental status is at baseline.     Cranial Nerves: No cranial nerve deficit.     Coordination: Coordination normal.  Psychiatric:        Mood and Affect: Mood normal.    LABORATORY DATA:  I have reviewed the data as listed Lab Results  Component Value Date   WBC 6.6 11/27/2017   HGB 10.6 (L) 11/27/2017   HCT 30.6 (L) 11/27/2017   MCV 115.4 (H) 11/27/2017   PLT 204 11/27/2017   No results for input(s): NA, K, CL, CO2, GLUCOSE, BUN, CREATININE, CALCIUM, GFRNONAA, GFRAA, PROT, ALBUMIN, AST, ALT, ALKPHOS, BILITOT, BILIDIR, IBILI in the last 8760 hours. Iron/TIBC/Ferritin/ %Sat No results found for: IRON, TIBC, FERRITIN, IRONPCTSAT    RADIOGRAPHIC STUDIES: I have personally reviewed the radiological images as listed and agreed with the findings in the report. No results found.    ASSESSMENT & PLAN:  1. Iron deficiency anemia due to chronic blood loss    #Labs are reviewed and discussed with patient.  Consistent with iron deficiency anemia.  Etiology probably due to her gastropathy. We discussed about the option of oral iron supplementation versus IV Venofer treatments.  Rationale of both options were discussed in details.  Patient is concerned about potential out-of-pocket cost of IV Venofer treatments. She would like to check with her insurance and decide. Meanwhile, I recommend her to start on ferrous sulfate 325 mg twice daily with meals.  If constipation, she  may take over-the-counter stool softener.  Will have patient follow-up in  8 to 10 weeks repeat blood work and assessment of need of IV Venofer treatment at that point.  She agrees with plan Orders Placed This Encounter  Procedures   CBC with Differential/Platelet    Standing Status:   Future    Standing Expiration Date:   03/18/2022   Ferritin    Standing Status:   Future    Standing Expiration Date:   03/18/2022   Iron and TIBC    Standing Status:   Future    Standing Expiration Date:   03/18/2022    All questions were answered. The patient knows to call the clinic with any problems questions or concerns.  cc Derinda Late, MD   Thank you for this kind referral and the opportunity to participate in the care of this patient. A copy of today's note is routed to referring provider    Earlie Server, MD, PhD Hematology Oncology [place] [facility]  03/18/2021

## 2021-03-18 NOTE — Progress Notes (Signed)
Pt here for follow up. No new concerns voiced.   

## 2021-04-16 ENCOUNTER — Ambulatory Visit: Admit: 2021-04-16 | Discharge: 2021-04-16 | Payer: MEDICARE | Attending: Physician Assistant | Primary: Family Medicine

## 2021-04-16 DIAGNOSIS — Z9889 Other specified postprocedural states: Secondary | ICD-10-CM

## 2021-04-16 MED ORDER — HYDROCODONE-ACETAMINOPHEN 7.5 MG-325 MG TAB
ORAL_TABLET | Freq: Three times a day (TID) | ORAL | 0 refills | Status: AC | PRN
Start: 2021-04-16 — End: 2021-04-23

## 2021-04-16 NOTE — Progress Notes (Signed)
Progress Notes by Sharen Hones, PA-C at 04/16/21 1030                Author: Sharen Hones, PA-C  Service: --  Author Type: Physician Assistant       Filed: 04/16/21 1124  Encounter Date: 04/16/2021  Status: Signed          Editor: Everlena Cooper (Physician Assistant)                      [facility]   [address], [address]   [place] [address]   [phone]                   Patient: Cheryl Paul                MRN:  [id number]       SSN: [id number]   Date of Birth: 1950/09/04        AGE:  70 y.o.        SEX: female   Body mass index is 33.66 kg/m??.      PCP: Mable Fill, MD   04/16/21         This office note has been dictated.         REVIEW OF SYSTEMS:   Constitutional: Negative for fever, chills, weight loss and malaise/fatigue.    HENT: Negative.     Eyes: Negative.     Respiratory: Negative.    Cardiovascular: Negative.    Gastrointestinal: No bowel incontinence or constipation.   Genitourinary: No bladder incontinence or saddle anesthesia.   Skin: Negative.    Neurological: Negative.     Endo/Heme/Allergies: Negative.     Psychiatric/Behavioral: Negative.   Musculoskeletal: As per HPI above.         Past Medical History:        Diagnosis  Date         ?  Arthritis            knee, arms,          ?  Asthma            resolved after gastric bypass         ?  Chronic obstructive pulmonary disease (HCC)            no meds, since gastric bypass         ?  Chronic pain       ?  Diabetes (HCC)            no meds         ?  H/O sinusitis       ?  Hx SBO            multiple surgeries prior to 2002         ?  Hypercholesterolemia       ?  Hypertension       ?  Sleep apnea            NOT USING          ?  Stress incontinence       ?  Thrombocytopenia (HCC)       ?  Toe fracture, left  August 2015          4th toe              Current Outpatient Medications:    ?  meloxicam (Mobic) 15 mg tablet, Take 1 Tablet by mouth daily., Disp: 42 Tablet, Rfl:  2   ?  clindamycin (CLEOCIN) 300 mg capsule, 3 po one hour prior to dental work., Disp: 9 Capsule, Rfl: 2   ?  aspirin delayed-release 81 mg tablet, Take 1 Tablet by mouth two (2) times a day., Disp: 60 Tablet, Rfl: 1   ?  spironolactone (ALDACTONE) 25 mg tablet, Take 1 Tablet by mouth daily., Disp: 90 Tablet, Rfl: 1   ?  potassium chloride (K-DUR, KLOR-CON M20) 20 mEq tablet, Take 2 Tablets by mouth three (3) times daily. (Patient taking differently: Take  40 mEq by mouth two (2) times a day.), Disp: 270 Tablet, Rfl: 0   ?  nystatin (MYCOSTATIN) topical cream, Apply  to affected area two (2) times a day. (Patient taking differently: Apply 1 g to affected area  two (2) times daily as needed for Itching or Skin Irritation (under her breasts).), Disp: 15 g, Rfl: 1   ?  ergocalciferol (ERGOCALCIFEROL) 1,250 mcg (50,000 unit) capsule, Take 1 Capsule by mouth every seven (7) days., Disp: 12 Capsule, Rfl:  3   ?  magnesium oxide (MAG-OX) 400 mg tablet, Take 1 Tablet by mouth daily., Disp: 90 Tablet, Rfl: 0   ?  calcium citrate 200 mg (950 mg) tablet, Take 1 Tab by mouth daily. Indications: malabsorption, Disp: 30 Tab, Rfl: 0   ?  multivitamin (ONE A DAY) tablet, Take 1 Tab by mouth daily., Disp: , Rfl:    ?  thiamine HCL (B-1) 100 mg tablet, Take 100 mg by mouth daily., Disp: , Rfl:    ?  cyanocobalamin 1,000 mcg tablet, Take 1,000 mcg by mouth daily., Disp: , Rfl:         Allergies        Allergen  Reactions         ?  Latex  Rash     ?  Acetaminophen-Codeine  Nausea Only and Other (comments)             Nausea and pains in stomach         ?  Codeine  Other (comments)             Nausea and abd pain         ?  Butrans [Buprenorphine]  Rash     ?  Iodine  Rash     ?  Lactose  Diarrhea             Lactose intolerance         ?  Other Plant, Educational psychologist, Environmental  Hives             Conductive electrodes         ?  Penicillins  Itching             Social History          Socioeconomic History         ?  Marital status:   SINGLE              Spouse name:  Not on file         ?  Number of children:  Not on file     ?  Years of education:  Not on file     ?  Highest education level:  Not on file       Occupational History        ?  Not on file  Tobacco Use         ?  Smoking status:  Never     ?  Smokeless tobacco:  Never       Substance and Sexual Activity         ?  Alcohol use:  No              Alcohol/week:  0.0 standard drinks         ?  Drug use:  No     ?  Sexual activity:  Not Currently        Other Topics  Concern        ?  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 Denies history of smoking           Social Determinants of Financial controller Strain: Not on file     Food Insecurity: Not on file     Transportation Needs: Not on file     Physical Activity: Not on file     Stress: Not on file     Social Connections: Not on file     Intimate Partner Violence: Not on file       Housing Stability: Not on file             Past Surgical History:         Procedure  Laterality  Date          ?  HX CARPAL TUNNEL RELEASE  Bilateral       ?  HX CHOLECYSTECTOMY         ?  HX GYN         ?  HX HERNIA REPAIR              abdomen          ?  HX HYSTERECTOMY              tubes tied          ?  HX LAP GASTRIC BYPASS    02/21/2018     ?  HX ORTHOPAEDIC              catrpal tunnel right and left          ?  HX POLYPECTOMY    04/18/2014     ?  PR ABDOMEN SURGERY PROC UNLISTED              4 surgeries for blockages              Patient seen evaluated today for her left total knee replacement.  She is now approximate 30-month status post left knee replacement surgery.  She did require manipulation anesthesia due to noncompliance.  She is improved.  She is having no real pain in  her knee except for at night.  She has been working in physical therapy as well as on her own and her range of motion activities.  She is had no troubles with the wound.  Overall  she is happy with the results of the knee replacement.    Patient  denies recent fevers, chills, chest pain, SOB, or injuries.   No recent systemic changes noted.  A 12-point review of systems is performed today.  Pertinent positives are noted.  All other systems reviewed and otherwise  are negative.    Physical  exam: General: Alert and oriented x3, nad.  well-developed, well nourished.  normal affect, AF.  NC/AT, EOMI, neck supple, trachea midline, no JVD present. Breathing is non-labored.  Examination lower extremities reveals pain-free range of motion  of the hips.  There is no pain to palpation the trochanter bursa.  Negative straight leg raise.  Negative calf tenderness.  Negative Homans.  No signs of DVT present.  Range of motion the knee is full extension to approximate 90 degrees of flexion.  Stability  is quite good.  Negative calf tenderness.  Negative Homans.  No signs of DVT present.    Assessment: Status post left total knee replacement, arthrofibrosis left knee    Plan: At this point, the patient was instructed to continue to work  on her range of motion activities on an hourly basis.  These were demonstrated for the patient today in office.  Refill pain medicine be sent to her pharmacy.  She is instructed on use as well as use precautions.  We will see her back in the office in  3 months time for evaluation and range of motion check.  She will call with any questions or concerns that should arise.                     JR Armstrong Creasy MPAS, PA-C, ATC

## 2021-04-17 ENCOUNTER — Encounter: Payer: Self-pay | Admitting: Oncology

## 2021-04-18 ENCOUNTER — Inpatient Hospital Stay: Admit: 2021-04-18 | Payer: MEDICARE | Primary: Family Medicine

## 2021-04-18 DIAGNOSIS — E119 Type 2 diabetes mellitus without complications: Secondary | ICD-10-CM

## 2021-04-18 LAB — COMPREHENSIVE METABOLIC PANEL
ALT: 23 U/L (ref 13–56)
AST: 17 U/L (ref 10–38)
Albumin/Globulin Ratio: 1.3 (ref 0.8–1.7)
Albumin: 3.9 g/dL (ref 3.4–5.0)
Alkaline Phosphatase: 85 U/L (ref 45–117)
Anion Gap: 2 mmol/L — ABNORMAL LOW (ref 3.0–18)
BUN: 13 MG/DL (ref 7.0–18)
Bun/Cre Ratio: 22 — ABNORMAL HIGH (ref 12–20)
CO2: 30 mmol/L (ref 21–32)
Calcium: 9.4 MG/DL (ref 8.5–10.1)
Chloride: 109 mmol/L (ref 100–111)
Creatinine: 0.58 MG/DL — ABNORMAL LOW (ref 0.6–1.3)
ESTIMATED GLOMERULAR FILTRATION RATE: >60 mL/min/{1.73_m2} (ref >60)
Globulin: 2.9 g/dL (ref 2.0–4.0)
Glucose: 97 mg/dL (ref 74–99)
Potassium: 4.6 mmol/L (ref 3.5–5.5)
Sodium: 141 mmol/L (ref 136–145)
Total Bilirubin: 0.7 MG/DL (ref 0.2–1.0)
Total Protein: 6.8 g/dL (ref 6.4–8.2)

## 2021-04-18 LAB — HEMOGLOBIN A1C W/EAG
Hemoglobin A1C: 5.7 % — ABNORMAL HIGH (ref 4.2–5.6)
eAG: 117 mg/dL

## 2021-04-18 LAB — METABOLIC PANEL, COMPREHENSIVE
A-G Ratio: 1.3 (ref 0.8–1.7)
ALT (SGPT): 23 U/L (ref 13–56)
AST (SGOT): 17 U/L (ref 10–38)
Albumin: 3.9 g/dL (ref 3.4–5.0)
Alk. phosphatase: 85 U/L (ref 45–117)
Anion gap: 2 mmol/L — ABNORMAL LOW (ref 3.0–18)
BUN/Creatinine ratio: 22 — ABNORMAL HIGH (ref 12–20)
BUN: 13 MG/DL (ref 7.0–18)
Bilirubin, total: 0.7 MG/DL (ref 0.2–1.0)
CO2: 30 mmol/L (ref 21–32)
Calcium: 9.4 MG/DL (ref 8.5–10.1)
Chloride: 109 mmol/L (ref 100–111)
Creatinine: 0.58 MG/DL — ABNORMAL LOW (ref 0.6–1.3)
Globulin: 2.9 g/dL (ref 2.0–4.0)
Glucose: 97 mg/dL (ref 74–99)
Potassium: 4.6 mmol/L (ref 3.5–5.5)
Protein, total: 6.8 g/dL (ref 6.4–8.2)
Sodium: 141 mmol/L (ref 136–145)
eGFR: >60 mL/min/{1.73_m2} (ref >60)

## 2021-04-18 LAB — HEMOGLOBIN A1C WITH EAG
Est. average glucose: 117 mg/dL
Hemoglobin A1c: 5.7 % — ABNORMAL HIGH (ref 4.2–5.6)

## 2021-04-18 NOTE — Telephone Encounter (Signed)
Dr. Tasia Catchings- FYI- pt not tolerating iron pills but sounds like she doesn't want IV iron either.   Aby, please cancel iron infusion on 1/5. Keep lab/ Md appts as scheduled

## 2021-04-18 NOTE — Progress Notes (Signed)
To be discussed on visit 12/7

## 2021-04-21 ENCOUNTER — Encounter: Payer: Self-pay | Admitting: Neurology

## 2021-04-23 ENCOUNTER — Ambulatory Visit: Admit: 2021-04-23 | Discharge: 2021-04-23 | Payer: MEDICARE | Attending: Family Medicine | Primary: Family Medicine

## 2021-04-23 DIAGNOSIS — Z0000 Encounter for general adult medical examination without abnormal findings: Secondary | ICD-10-CM

## 2021-04-23 DIAGNOSIS — Z23 Encounter for immunization: Secondary | ICD-10-CM

## 2021-04-23 NOTE — Progress Notes (Signed)
This is the Subsequent Medicare Annual Wellness Exam, performed 12 months or more after the Initial AWV or the last Subsequent AWV    I have reviewed the patient's medical history in detail and updated the computerized patient record.       Assessment/Plan   Education and counseling provided:  Are appropriate based on today's review and evaluation  Pneumococcal Vaccine- 23/13  completed  Influenza Vaccine-annually, given today  Screening Mammography 07/2020  Colorectal cancer screening tests 10/2019 FIT due order placed  Cardiovascular screening blood test- 08/2020  Bone mass measurement (DEXA) 10/2018 normal  Screening for glaucoma per pt utd, to send [place] records        Depression Risk Factor Screening     3 most recent PHQ Screens 04/23/2021   Little interest or pleasure in doing things Not at all   Feeling down, depressed, irritable, or hopeless Not at all   Total Score PHQ 2 0       Alcohol Risk Screen    Do you average more than 1 drink per night or more than 7 drinks a week:  No    On any one occasion in the past three months have you have had more than 3 drinks containing alcohol:  No        Functional Ability and Level of Safety    Hearing: Hearing is good.      Activities of Daily Living:  The home contains: no safety equipment.  Patient does total self care      Ambulation: with no difficulty     Fall Risk:  Fall Risk Assessment, last 12 mths 04/23/2021   Able to walk? Yes   Fall in past 12 months? 0   Do you feel unsteady? 1   Are you worried about falling 0   Is TUG test greater than 12 seconds? 0   Is the gait abnormal? 0   Number of falls in past 12 months -   Fall with injury? -      Abuse Screen:  Patient is not abused       Cognitive Screening    Has your family/caregiver stated any concerns about your memory: no         Health Maintenance Due     Health Maintenance Due   Topic Date Due    Shingrix Vaccine Age 11> (1 of 2) Never done    COVID-19 Vaccine (3 - Booster for [place] series) 11/30/2019    Foot  Exam Q1  05/22/2020    Medicare Yearly Exam  03/05/2021    Colorectal Cancer Screening Combo  04/23/2021       Patient Care Team   Patient Care Team:  Dalene Seltzer, MD as PCP - General (Family Medicine)  Dalene Seltzer, MD as PCP - [facility] Empaneled Provider  Donavan Foil, MD (Pulmonary Disease)  Dale Durham, DO (General Surgery)  Jani Files, Ester (Optometry)  Stefan Church, MD (Endocrinology Physician)    History     Patient Active Problem List   Diagnosis Code    Chronic infection of sinus J32.9    Type 2 diabetes mellitus without complication (HCC) Z61.0    Incontinence in female R32    Candidal intertrigo B37.2    Arthritis, multiple joint involvement M12.9    Essential hypertension I10    COPD (chronic obstructive pulmonary disease) ([place]) J44.9    Hyperlipidemia E78.5    Vitamin D deficiency E55.9    Internal hemorrhoids  K64.8    Colon polyps K63.5    Hypokalemia E87.6    Hypomagnesemia E83.42    Syncope R55    Elevated troponin R77.8    Short gut syndrome K91.2    Hypocalcemia E83.51    Mild intermittent asthma J45.20    Thrombocytopenia, unspecified D69.6    Arthritis of knee M17.10    Non-compliance Z91.199    Arthrofibrosis of knee joint, 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     Past Medical History:   Diagnosis Date    Arthritis     knee, arms,     Asthma     resolved after gastric bypass    Chronic obstructive pulmonary disease (HCC)     no meds, since gastric bypass    Chronic pain     Diabetes (HCC)     no meds    H/O sinusitis     Hx SBO     multiple surgeries prior to 2002    Hypercholesterolemia     Hypertension     Sleep apnea     NOT USING     Stress incontinence     Thrombocytopenia (HCC)     Toe fracture, left August 2015    4th toe      Past Surgical History:   Procedure Laterality Date    HX CARPAL TUNNEL RELEASE Bilateral     HX CHOLECYSTECTOMY      HX GYN      HX HERNIA REPAIR      abdomen    HX HYSTERECTOMY      tubes tied    HX LAP GASTRIC BYPASS  02/21/2018    HX  ORTHOPAEDIC      catrpal tunnel right and left    HX POLYPECTOMY  04/18/2014    PR ABDOMEN SURGERY PROC UNLISTED      4 surgeries for blockages     Current Outpatient Medications   Medication Sig Dispense Refill    HYDROcodone-acetaminophen (Norco) 7.5-325 mg per tablet Take 1-2 Tablets by mouth every eight (8) hours as needed for Pain for up to 7 days. Max Daily Amount: 6 Tablets. 42 Tablet 0    aspirin delayed-release 81 mg tablet Take 1 Tablet by mouth two (2) times a day. 60 Tablet 1    spironolactone (ALDACTONE) 25 mg tablet Take 1 Tablet by mouth daily. 90 Tablet 1    potassium chloride (K-DUR, KLOR-CON M20) 20 mEq tablet Take 2 Tablets by mouth three (3) times daily. 270 Tablet 0    ergocalciferol (ERGOCALCIFEROL) 1,250 mcg (50,000 unit) capsule Take 1 Capsule by mouth every seven (7) days. 12 Capsule 3    magnesium oxide (MAG-OX) 400 mg tablet Take 1 Tablet by mouth daily. 90 Tablet 0    calcium citrate 200 mg (950 mg) tablet Take 1 Tab by mouth daily. Indications: malabsorption 30 Tab 0    multivitamin (ONE A DAY) tablet Take 1 Tab by mouth daily.      thiamine HCL (B-1) 100 mg tablet Take 100 mg by mouth daily.      cyanocobalamin 1,000 mcg tablet Take 1,000 mcg by mouth daily.      clindamycin (CLEOCIN) 300 mg capsule 3 po one hour prior to dental work. (Patient taking differently: 3 po one hour prior to dental work (use as directed for upcoming dental procedure)) 9 Capsule 2    nystatin (MYCOSTATIN) topical cream Apply  to affected area two (2) times a day. (Patient taking differently: Apply 1 g to affected area  two (2) times daily as needed for Itching or Skin Irritation (under her breasts).) 15 g 1     Allergies   Allergen Reactions    Latex Rash    Acetaminophen-Codeine Nausea Only and Other (comments)     Nausea and pains in stomach    Codeine Other (comments)     Nausea and abd pain    Butrans [Buprenorphine] Rash    Iodine Rash    Lactose Diarrhea     Lactose intolerance    Other Plant, Higher education careers adviser,  Environmental Hives     Conductive electrodes    Penicillins Itching       Family History   Problem Relation Age of Onset    Hypertension Mother     Stroke Mother     Diabetes Mother     Thyroid Disease Mother     Arthritis-rheumatoid Mother     Cancer Father         colon polpe    Cancer Maternal Aunt      Social History     Tobacco Use    Smoking status: Never    Smokeless tobacco: Never   Substance Use Topics    Alcohol use: No     Alcohol/week: 0.0 standard drinks         Dalene Seltzer, MD       SUBJECTIVE  Ff-up    HTN- currently on aldactone  Hypokalemia- endo eval neg for hyperaldo, currently on aldactone and kcl 40 meqs TID with normal K levels.     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 She episodic jitteriness, dizziness, and  loose stools after every meal, requesting to see dietician again. Will refer back to bariatric clinic.     DM-diet controlled, she does not check glucose at home. She is no longer on metformin.      Asthma ?COPD- non smoker, denies cough, sob, wheezing. She says last albuterol use was a year ago. Usually heat triggers respiratory symptoms.      ROS:  See HPI, all others negative        Patient Active Problem List   Diagnosis Code    Chronic infection of sinus J32.9    Type 2 diabetes mellitus without complication (HCC) R44.3    Incontinence in female R32    Candidal intertrigo B37.2    Arthritis, multiple joint involvement M12.9    Essential hypertension I10    COPD (chronic obstructive pulmonary disease) ([place]) J44.9    Hyperlipidemia E78.5    Vitamin D deficiency E55.9    Internal hemorrhoids K64.8    Colon polyps K63.5    Hypokalemia E87.6    Hypomagnesemia E83.42    Syncope R55    Elevated troponin R77.8    Short gut syndrome K91.2    Hypocalcemia E83.51    Mild intermittent asthma J45.20    Thrombocytopenia, unspecified D69.6    Arthritis of knee M17.10     Non-compliance Z91.199    Arthrofibrosis of knee joint, 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 Max Daily Amount: 6 Tablets. 42 Tablet 0    aspirin delayed-release 81 mg tablet Take 1 Tablet by mouth two (2) times a day. 60 Tablet  1    spironolactone (ALDACTONE) 25 mg tablet Take 1 Tablet by mouth daily. 90 Tablet 1    potassium chloride (K-DUR, KLOR-CON M20) 20 mEq tablet Take 2 Tablets by mouth three (3) times daily. 270 Tablet 0    ergocalciferol (ERGOCALCIFEROL) 1,250 mcg (50,000 unit) capsule Take 1 Capsule by mouth every seven (7) days. 12 Capsule 3    magnesium oxide (MAG-OX) 400 mg tablet Take 1 Tablet by mouth daily. 90 Tablet 0    calcium citrate 200 mg (950 mg) tablet Take 1 Tab by mouth daily. Indications: malabsorption 30 Tab 0    multivitamin (ONE A DAY) tablet Take 1 Tab by mouth daily.      thiamine HCL (B-1) 100 mg tablet Take 100 mg by mouth daily.      cyanocobalamin 1,000 mcg tablet Take 1,000 mcg by mouth daily.      clindamycin (CLEOCIN) 300 mg capsule 3 po one hour prior to dental work. (Patient taking differently: 3 po one hour prior to dental work (use as directed for upcoming dental procedure)) 9 Capsule 2    nystatin (MYCOSTATIN) topical cream Apply  to affected area two (2) times a day. (Patient taking differently: Apply 1 g to affected area two (2) times daily as needed for Itching or Skin Irritation (under her breasts).) 15 g 1       Allergies   Allergen Reactions    Latex Rash    Acetaminophen-Codeine Nausea Only and Other (comments)     Nausea and pains in stomach    Codeine Other (comments)     Nausea and abd pain    Butrans [Buprenorphine] Rash    Iodine Rash    Lactose Diarrhea     Lactose intolerance    Other Plant, Higher education careers adviser, Environmental Hives     Conductive electrodes     Penicillins Itching       Past Medical History:   Diagnosis Date    Arthritis     knee, arms,     Asthma     resolved after gastric bypass    Chronic obstructive pulmonary disease (HCC)     no meds, since gastric bypass    Chronic pain     Diabetes (HCC)     no meds    H/O sinusitis     Hx SBO     multiple surgeries prior to 2002    Hypercholesterolemia     Hypertension     Sleep apnea     NOT USING     Stress incontinence     Thrombocytopenia (HCC)     Toe fracture, left August 2015    4th toe       Social History     Socioeconomic History    Marital status: SINGLE     Spouse name: Not on file    Number of children: Not on file    Years of education: Not on file    Highest education level: Not on file   Occupational History    Not on file   Tobacco Use    Smoking status: Never    Smokeless tobacco: Never   Vaping Use    Vaping Use: Never used   Substance and Sexual Activity    Alcohol use: No     Alcohol/week: 0.0 standard drinks    Drug use: No    Sexual activity: Not Currently     Partners: Male   Other Topics Concern    Not on file  Social History Narrative    Retired Building control surveyor, 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 Denies history of smoking      Social Determinants of Company secretary Strain: Low Risk     Difficulty of Paying Living Expenses: Not very hard   Food Insecurity: No Food Insecurity    Worried About Charity fundraiser in the Last Year: Never true    Arboriculturist in the Last Year: Never true   Transportation Needs: Not on file   Physical Activity: Not on file   Stress: Not on file   Social Connections: Not on file   Intimate Partner Violence: Not on file   Housing Stability: Not on file       Family History   Problem Relation Age of Onset    Hypertension Mother     Stroke Mother     Diabetes Mother     Thyroid Disease Mother     Arthritis-rheumatoid Mother     Cancer Father         colon polpe    Cancer Maternal Aunt          OBJECTIVE    Physical Exam:     Visit  Vitals  BP 128/80 (BP 1 Location: Right arm, BP Patient Position: Sitting, BP Cuff Size: Large adult)   Pulse 66   Temp 97.9 ??F (36.6 ??C) (Temporal)   Resp 16   Ht 5' 3"  (1.6 m)   Wt 188 lb (85.3 kg)   SpO2 97%   BMI 33.30 kg/m??       General: alert, well-appearing,obese,AA, in no apparent distress or pain  Neck: supple, no adenopathy palpated  CVS: normal rate, regular rhythm, distinct S1 and S2  Lungs:clear to ausculation bilaterally, no crackles, wheezing or rhonchi noted  Abdomen: normoactive bowel sounds, soft, non-tender  Extremities: no edema, no cyanosis, MSK grossly normal  Skin: warm, no lesions, rashes noted  Psych:  mood and affect normal  Results for orders placed or performed during the hospital encounter of 04/18/21   HEMOGLOBIN A1C WITH EAG   Result Value Ref Range    Hemoglobin A1c 5.7 (H) 4.2 - 5.6 %    Est. average glucose 098 mg/dL   METABOLIC PANEL, COMPREHENSIVE   Result Value Ref Range    Sodium 141 136 - 145 mmol/L    Potassium 4.6 3.5 - 5.5 mmol/L    Chloride 109 100 - 111 mmol/L    CO2 30 21 - 32 mmol/L    Anion gap 2 (L) 3.0 - 18 mmol/L    Glucose 97 74 - 99 mg/dL    BUN 13 7.0 - 18 MG/DL    Creatinine 0.58 (L) 0.6 - 1.3 MG/DL    BUN/Creatinine ratio 22 (H) 12 - 20      eGFR >60 >60 ml/min/1.32m    Calcium 9.4 8.5 - 10.1 MG/DL    Bilirubin, total 0.7 0.2 - 1.0 MG/DL    ALT (SGPT) 23 13 - 56 U/L    AST (SGOT) 17 10 - 38 U/L    Alk. phosphatase 85 45 - 117 U/L    Protein, total 6.8 6.4 - 8.2 g/dL    Albumin 3.9 3.4 - 5.0 g/dL    Globulin 2.9 2.0 - 4.0 g/dL    A-G Ratio 1.3 0.8 - 1.7             ASSESSMENT/PLAN  Diagnoses and all orders for this visit:  Essential hypertension  controlled w/ hypoKalemia issues  cont aldactone 25 mg  Cont kcl 20 meqs TID  DASH diet, BP Log  CMP prior to next visit     Diabetes mellitus type 2, diet-controlled ([place])  Off meds after gastric bypass 10/19  Monitoring      Mild intermittent asthma, unspecified whether complicated  Controlled  Cont prn  albuterol   flu vaccine given     Pure hypercholesterolemia   off statin after wt loss/gastric bypass 2019  Monitoring, satisfactory lipids 07/2019     Chronic obstructive pulmonary disease, unspecified COPD type ([place])  Unclear h/o, nonsmoker  pt reports recent pulm clearance prior to gastric bypass ?[facility]  Cont prn albuterol    BMI 32  S/p gastric bypass    Hypokalemia  See #1    Vitamin D deficiency  Improving, s/p gastric bypass  Cont 50 k weekly  Monitoring    S/p gastric bypass  W/ dumping syndrome/symptoms of dehydration/hypoglycemia erratic  Advised increase hydration, protein on each meal  Will refer back to bariatric center- dietician      Ff-up in 3 months or sooner prn        Patient understands plan of care. Patient has provided input and agrees with goals.

## 2021-04-23 NOTE — Progress Notes (Signed)
 Chief Complaint   Patient presents with    Asthma    Cholesterol Problem    COPD    Diabetes    Hypertension    Other     Hx of hypokalemia     Results     Review of results       1. Have you been to the ER, urgent care clinic since your last visit?  Hospitalized since your last visit? No    2. Have you seen or consulted any other health care providers outside of the [facility] since your last visit? No     3. For patients aged 65-75: Has the patient had a colonoscopy / FIT/ Cologuard? Yes - Care Gap present. Most recent result on file      If the patient is female:    4. For patients aged 52-74: Has the patient had a mammogram within the past 2 years? Yes - no Care Gap present      5. For patients aged 21-65: Has the patient had a pap smear? No hx of hysterectomy     Annual eye exam: October 15,2021  Pneumococcal vaccine: 2017  Flu vaccine: 2021  Patient instructed to remove shoes: Yes     Abuse Screening Questionnaire 04/23/2021   Do you ever feel afraid of your partner? N   Are you in a relationship with someone who physically or mentally threatens you? N   Is it safe for you to go home? Y     Fall Risk Assessment, last 12 mths 04/23/2021   Able to walk? Yes   Fall in past 12 months? 0   Do you feel unsteady? 1   Are you worried about falling 0   Is TUG test greater than 12 seconds? 0   Is the gait abnormal? 0   Number of falls in past 12 months -   Fall with injury? -     3 most recent PHQ Screens 04/23/2021   Little interest or pleasure in doing things Not at all   Feeling down, depressed, irritable, or hopeless Not at all   Total Score PHQ 2 0      Physician order obtained. Patient completed adult immunization consent form.  Allergies, contraindications and recommendations reviewed with patient. Seasonal influenza vaccine administered IM right deltoid.  Patient tolerated well.  Patient remained in office for 15 minutes after injection and no adverse reactions were noted.    Lot # [id number]  Exp  Date: 10/04/2021  [facility] # [phone]

## 2021-04-23 NOTE — Progress Notes (Signed)
Stool screening test is negative for blood in the colon, this is reassuring  Plan for reevaluation next year, pls notify pt mail/call

## 2021-04-23 NOTE — Progress Notes (Signed)
Result letter mailed 06/23/2021

## 2021-05-05 MED ORDER — ERGOCALCIFEROL (VITAMIN D2) 50,000 UNIT CAP
1250 mcg (50,000 unit) | ORAL_CAPSULE | ORAL | 3 refills | Status: AC
Start: 2021-05-05 — End: ?

## 2021-05-14 ENCOUNTER — Other Ambulatory Visit: Payer: Self-pay | Admitting: *Deleted

## 2021-05-14 DIAGNOSIS — D500 Iron deficiency anemia secondary to blood loss (chronic): Secondary | ICD-10-CM

## 2021-05-21 ENCOUNTER — Inpatient Hospital Stay: Payer: Medicare HMO | Attending: Oncology

## 2021-05-21 ENCOUNTER — Other Ambulatory Visit: Payer: Self-pay

## 2021-05-21 DIAGNOSIS — Z87891 Personal history of nicotine dependence: Secondary | ICD-10-CM | POA: Insufficient documentation

## 2021-05-21 DIAGNOSIS — D509 Iron deficiency anemia, unspecified: Secondary | ICD-10-CM | POA: Diagnosis not present

## 2021-05-21 DIAGNOSIS — Z79899 Other long term (current) drug therapy: Secondary | ICD-10-CM | POA: Diagnosis not present

## 2021-05-21 DIAGNOSIS — D500 Iron deficiency anemia secondary to blood loss (chronic): Secondary | ICD-10-CM

## 2021-05-21 DIAGNOSIS — K319 Disease of stomach and duodenum, unspecified: Secondary | ICD-10-CM | POA: Insufficient documentation

## 2021-05-21 DIAGNOSIS — E079 Disorder of thyroid, unspecified: Secondary | ICD-10-CM | POA: Diagnosis not present

## 2021-05-21 LAB — IRON AND TIBC
Iron: 40 ug/dL (ref 28–170)
Saturation Ratios: 8 % — ABNORMAL LOW (ref 10.4–31.8)
TIBC: 487 ug/dL — ABNORMAL HIGH (ref 250–450)
UIBC: 447 ug/dL

## 2021-05-21 LAB — CBC WITH DIFFERENTIAL/PLATELET
Abs Immature Granulocytes: 0.02 10*3/uL (ref 0.00–0.07)
Basophils Absolute: 0 10*3/uL (ref 0.0–0.1)
Basophils Relative: 1 %
Eosinophils Absolute: 0 10*3/uL (ref 0.0–0.5)
Eosinophils Relative: 1 %
HCT: 31.6 % — ABNORMAL LOW (ref 36.0–46.0)
Hemoglobin: 9.7 g/dL — ABNORMAL LOW (ref 12.0–15.0)
Immature Granulocytes: 0 %
Lymphocytes Relative: 49 %
Lymphs Abs: 2.9 10*3/uL (ref 0.7–4.0)
MCH: 28.2 pg (ref 26.0–34.0)
MCHC: 30.7 g/dL (ref 30.0–36.0)
MCV: 91.9 fL (ref 80.0–100.0)
Monocytes Absolute: 0.6 10*3/uL (ref 0.1–1.0)
Monocytes Relative: 10 %
Neutro Abs: 2.3 10*3/uL (ref 1.7–7.7)
Neutrophils Relative %: 39 %
Platelets: 243 10*3/uL (ref 150–400)
RBC: 3.44 MIL/uL — ABNORMAL LOW (ref 3.87–5.11)
RDW: 15.9 % — ABNORMAL HIGH (ref 11.5–15.5)
WBC: 5.8 10*3/uL (ref 4.0–10.5)
nRBC: 0 % (ref 0.0–0.2)

## 2021-05-21 LAB — FERRITIN: Ferritin: 10 ng/mL — ABNORMAL LOW (ref 11–307)

## 2021-05-22 ENCOUNTER — Ambulatory Visit: Payer: Medicare HMO

## 2021-05-22 ENCOUNTER — Inpatient Hospital Stay: Payer: Medicare HMO | Admitting: Oncology

## 2021-05-22 ENCOUNTER — Encounter: Payer: Self-pay | Admitting: Oncology

## 2021-05-22 VITALS — BP 122/75 | Temp 97.3°F | Wt 138.5 lb

## 2021-05-22 DIAGNOSIS — D509 Iron deficiency anemia, unspecified: Secondary | ICD-10-CM | POA: Diagnosis not present

## 2021-05-22 DIAGNOSIS — D500 Iron deficiency anemia secondary to blood loss (chronic): Secondary | ICD-10-CM | POA: Diagnosis not present

## 2021-05-22 NOTE — Progress Notes (Signed)
Hematology/Oncology Consult note  [phone] [phone]   Patient Care Team: Derinda Late, MD as PCP - General (Family Medicine)  REFERRING PROVIDER: Derinda Late, MD  CHIEF COMPLAINTS/REASON FOR VISIT:  Iron deficiency anemia  HISTORY OF PRESENTING ILLNESS:   Brittany [name] is a  71 y.o.  female with PMH listed below was seen in consultation at the request of  Derinda Late, MD  for evaluation of anemia Patient had blood work done at primary care provider's office.  02/28/2021, hemoglobin 9, MCV 86.4, normal WBC.  Normal platelet count. 03/07/2021 TIBC 480, iron saturation 9, ferritin 8.  Vitamin B12 566.  TSH 1.855 Reviewed patient's previous blood work via care everywhere as well as within our current EMR. Anemia is chronic, dated back to 2014. Denies any black or bloody stool.  Denies unintentional weight loss, abdominal pain. + Acid reflux  08/05/2020, upper endoscopy showed gastritis, partial fundoplication was found.  The wrap appears loose. Colonoscopy 10/24/2018 small polyps removed.  Internal hemorrhoids. she has previously tried oral iron supplementation.  Currently she is not on any iron treatment. She feels tired and fatigued.  INTERVAL HISTORY Brittany [name] is a 71 y.o. female who has above history reviewed by me today presents for follow up visit for management of Iron deficiency anemia She is not able to tolerate oral iron supplementation. She also declined IV venofer.  + fatigue. Denies any black stool or blood in stool.    Review of Systems  Constitutional:  Positive for fatigue. Negative for appetite change, chills and fever.  HENT:   Negative for hearing loss and voice change.   Eyes:  Negative for eye problems.  Respiratory:  Negative for chest tightness and cough.   Cardiovascular:  Negative for chest pain.  Gastrointestinal:  Negative for abdominal distention, abdominal pain and blood in stool.       Acid reflux   Endocrine: Negative for hot flashes.  Genitourinary:  Negative for difficulty urinating and frequency.   Musculoskeletal:  Negative for arthralgias.  Skin:  Negative for itching and rash.  Neurological:  Negative for extremity weakness.  Hematological:  Negative for adenopathy.  Psychiatric/Behavioral:  Negative for confusion.    MEDICAL HISTORY:  Past Medical History:  Diagnosis Date   Allergic genetic state    Anemia    Anxiety    Arthritis    Cataract    Chicken pox    Complication of anesthesia    Depression    Family history of adverse reaction to anesthesia    mother - PONV   GERD (gastroesophageal reflux disease)    H/O hiatal hernia    History of GI bleed    Hypercholesteremia    Hyperlipidemia    Hypothyroidism    Osteoporosis    PONV (postoperative nausea and vomiting)    Stroke ([place]) 11/2017   Thyroid disease    Wears dentures    partial upper, full lower    SURGICAL HISTORY: Past Surgical History:  Procedure Laterality Date   ABDOMINAL HYSTERECTOMY  05/18/2000   total   BRAVO [place] STUDY  05/26/2012   Procedure: BRAVO [place];  Surgeon: Lear Ng, MD;  Location: [facility];  Service: Endoscopy;  Laterality: N/A;   CHOLECYSTECTOMY  05/18/2004   COLONOSCOPY WITH PROPOFOL N/A 10/24/2018   Procedure: COLONOSCOPY WITH PROPOFOL;  Surgeon: Manya Silvas, MD;  Location: [facility] ENDOSCOPY;  Service: Endoscopy;  Laterality: N/A;   detatched retina surgery     ESOPHAGOGASTRODUODENOSCOPY  05/26/2012  Procedure: ESOPHAGOGASTRODUODENOSCOPY (EGD);  Surgeon: Lear Ng, MD;  Location: [facility];  Service: Endoscopy;  Laterality: N/A;   ESOPHAGOGASTRODUODENOSCOPY  05/30/2012   Procedure: ESOPHAGOGASTRODUODENOSCOPY (EGD);  Surgeon: Cleotis Nipper, MD;  Location: [facility]OS[facility];  Service: Endoscopy;  Laterality: N/A;   ESOPHAGOGASTRODUODENOSCOPY N/A 08/31/2012   Procedure: ESOPHAGOGASTRODUODENOSCOPY (EGD);  Surgeon: Wonda Horner, MD;  Location:  [facility] ENDOSCOPY;  Service: Endoscopy;  Laterality: N/A;   ESOPHAGOGASTRODUODENOSCOPY (EGD) WITH PROPOFOL N/A 10/24/2018   Procedure: ESOPHAGOGASTRODUODENOSCOPY (EGD) WITH PROPOFOL;  Surgeon: Manya Silvas, MD;  Location: [facility] ENDOSCOPY;  Service: Endoscopy;  Laterality: N/A;   ESOPHAGOGASTRODUODENOSCOPY (EGD) WITH PROPOFOL N/A 08/05/2020   Procedure: ESOPHAGOGASTRODUODENOSCOPY (EGD) WITH PROPOFOL;  Surgeon: Toledo, Benay Pike, MD;  Location: [facility] ENDOSCOPY;  Service: Gastroenterology;  Laterality: N/A;   EYE SURGERY     HIP SURGERY  1970s   "hip sunk in"   NECK SURGERY  05/18/2010   PLANTAR FASCIA RELEASE Left 10/10/2020   Procedure: ENDOSCOPIC PLANTAR FASC. RELEASE;  Surgeon: [name], DPM;  Location: [place];  Service: Podiatry;  Laterality: Left;  ANESTHESIA- CHOICE   ROTATOR CUFF REPAIR  03/18/2012   right side   THYROID SURGERY     nodule removed, right side   TMJ ARTHROPLASTY  05/18/1990   TUBAL LIGATION  05/19/1979    SOCIAL HISTORY: Social History   Socioeconomic History   Marital status: Divorced    Spouse name: Not on file   Number of children: Not on file   Years of education: Not on file   Highest education level: Not on file  Occupational History   Not on file  Tobacco Use   Smoking status: Former    Packs/day: 1.50    Years: 20.00    Pack years: 30.00    Types: Cigarettes    Quit date: 02/05/2008    Years since quitting: 13.3   Smokeless tobacco: Never  Vaping Use   Vaping Use: Never used  Substance and Sexual Activity   Alcohol use: No   Drug use: No   Sexual activity: Not on file  Other Topics Concern   Not on file  Social History Narrative   Not on file   Social Determinants of Health   Financial Resource Strain: Not on file  Food Insecurity: Not on file  Transportation Needs: Not on file  Physical Activity: Not on file  Stress: Not on file  Social Connections: Not on file  Intimate Partner Violence: Not on file    FAMILY  HISTORY: Family History  Problem Relation Age of Onset   Hypertension Mother    Kidney disease Mother    Heart attack Mother    Osteoporosis Mother    Lung cancer Father    Cancer Sister    Leukemia Sister    Colon polyps Sister    Ovarian cancer Paternal Aunt    Ovarian cancer Maternal Grandmother     ALLERGIES:  is allergic to lithium, fentanyl, morphine, and pseudoephedrine.  MEDICATIONS:  Current Outpatient Medications  Medication Sig Dispense Refill   Bromfenac Sodium (PROLENSA OP) Apply to eye 3 (three) times a week.     CHOLECALCIFEROL PO Take 2,000 Units by mouth daily.     citalopram (CELEXA) 20 MG tablet Take 20 mg by mouth daily.     clorazepate (TRANXENE) 7.5 MG tablet Take 7.5 mg by mouth See admin instructions. Take 7.5 mg by mouth in the morning and at bedtime. May take an additional during the day if  needed.     levothyroxine (SYNTHROID, LEVOTHROID) 25 MCG tablet Take 25 mcg by mouth every morning.      montelukast (SINGULAIR) 10 MG tablet Take 10 mg by mouth at bedtime.     traZODone (DESYREL) 50 MG tablet Take 50 mg by mouth at bedtime.     vitamin B-12 (CYANOCOBALAMIN) 1000 MCG tablet Take 1,000 mcg by mouth daily.     atorvastatin (LIPITOR) 40 MG tablet Take 1 tablet (40 mg total) by mouth daily. 30 tablet 0   betamethasone valerate lotion (VALISONE) 0.1 % Apply 1 application topically daily.     ondansetron (ZOFRAN) 4 MG tablet Take 1 tablet (4 mg total) by mouth every 8 (eight) hours as needed for nausea or vomiting. 20 tablet 0   No current facility-administered medications for this visit.     PHYSICAL EXAMINATION: ECOG PERFORMANCE STATUS: 1 - Symptomatic but completely ambulatory Vitals:   05/22/21 1430  BP: 122/75  Temp: (!) 97.3 F (36.3 C)   Filed Weights   05/22/21 1430  Weight: 138 lb 8 oz (62.8 kg)    Physical Exam Constitutional:      General: She is not in acute distress. HENT:     Head: Normocephalic and atraumatic.  Eyes:      General: No scleral icterus. Cardiovascular:     Rate and Rhythm: Normal rate and regular rhythm.     Heart sounds: Normal heart sounds.  Pulmonary:     Effort: Pulmonary effort is normal. No respiratory distress.     Breath sounds: No wheezing.  Abdominal:     General: Bowel sounds are normal. There is no distension.     Palpations: Abdomen is soft.  Musculoskeletal:        General: No deformity. Normal range of motion.     Cervical back: Normal range of motion and neck supple.  Skin:    General: Skin is warm and dry.     Findings: No erythema or rash.  Neurological:     Mental Status: She is alert and oriented to person, place, and time. Mental status is at baseline.     Cranial Nerves: No cranial nerve deficit.     Coordination: Coordination normal.  Psychiatric:        Mood and Affect: Mood normal.    LABORATORY DATA:  I have reviewed the data as listed Lab Results  Component Value Date   WBC 5.8 05/21/2021   HGB 9.7 (L) 05/21/2021   HCT 31.6 (L) 05/21/2021   MCV 91.9 05/21/2021   PLT 243 05/21/2021   No results for input(s): NA, K, CL, CO2, GLUCOSE, BUN, CREATININE, CALCIUM, GFRNONAA, GFRAA, PROT, ALBUMIN, AST, ALT, ALKPHOS, BILITOT, BILIDIR, IBILI in the last 8760 hours. Iron/TIBC/Ferritin/ %Sat    Component Value Date/Time   IRON 40 05/21/2021 1538   TIBC 487 (H) 05/21/2021 1538   FERRITIN 10 (L) 05/21/2021 1538   IRONPCTSAT 8 (L) 05/21/2021 1538      RADIOGRAPHIC STUDIES: I have personally reviewed the radiological images as listed and agreed with the findings in the report. No results found.    ASSESSMENT & PLAN:  1. Iron deficiency anemia due to chronic blood loss    # Iron deficiency anemia.  Etiology probably due to her gastropathy. Not able to tolerate oral iron supplementation.  Labs are reviewed and discussed with patient. Anemia and iron panel are worse.  We discussed about the option of IV Venofer. Rationale and potential side effects were  discussed with  patient.  She agrees with one dose of IV Venofer due to financial reason. \ She is aware that she would likely need additional IV venofer. I ask patient to call our office if she changes her mind.   Recommend her to make an appointment with GI she has had EGD and colonoscopy in recent year. May need capsule study.   All questions were answered. The patient knows to call the clinic with any problems questions or concerns.  cc Derinda Late, MD    Earlie Server, MD, PhD 05/22/2021

## 2021-05-23 ENCOUNTER — Other Ambulatory Visit: Payer: Self-pay

## 2021-05-23 ENCOUNTER — Telehealth: Payer: Self-pay

## 2021-05-23 DIAGNOSIS — D500 Iron deficiency anemia secondary to blood loss (chronic): Secondary | ICD-10-CM

## 2021-05-23 NOTE — Telephone Encounter (Signed)
Referral for Gastro order placed and requested info was faxed to Dr. Alice Reichert.

## 2021-05-23 NOTE — Progress Notes (Signed)
[facility] Clinic Note - Initial Visit   Date: 05/26/21  Brittany [name] MRN: [id number] DOB: Jun 22, 1950   Dear Dr. Luana Shu:  Thank you for your kind referral of Brittany [name] for consultation of bilateral feet pain. Although her history is well known to you, please allow [place] to reiterate it for the purpose of our medical record. The patient was accompanied to the clinic by self.    History of Present Illness: DAIANA [name] is a 71 y.o. left-handed female with iron deficient anemia, anxiety/depression, GERD, hyperlipidemia, hypertension, and hypothyroidism presenting for evaluation of bilateral feet pain.   Starting around August 2022, she began having numbness/tingling involving the feet and lower legs. She also complains of pain in the knees and hands, which sounds more arthritic in nature. No numbness/tingling in the hands or knees.  She has been seeing Dr. Melrose Nakayama at [facility] since 2019 and was evaluated for these symptoms in September. Because of concern of plantar fasciitis, she was offered diclofenac and trial of Lyrica.  Her insurance, however, did not cover Lyrica. She is here for second opinion for her feet pain.  She also has a known right frontal parafalcine meningioma and previously seen seen neurosurgery, Dr. Deetta Perla, who recommended follow-up imaging in 2 year. She feels that it is causing new headaches and deformity of her skull.  She lives alone.  Nonsmoker, does not drink alcohol.   Out-side paper records, electronic medical record, and images have been reviewed where available and summarized as:   05/19/2020  MRI BRAIN WO CONTRAST  IMPRESSION:  1.  No acute intracranial pathology identified.  2.  Right anterior frontal lobe parafalcine meningioma measures 12 x 3 x 11 mm in size.  3.  Mild chronic small vessel ischemic change.   02/02/2018 EMG IMPRESSION: Abnormal study. There is electrodiagnostic evidence of mild bilateral  carpal tunnel syndrome and mild right ulnar mononeuropathy.   01/13/2018 EEG IMPRESSION: This routine EEG in the awake and asleep states is within normal limits.     11/27/17 MR BRAIN WO CONTRAST IMPRESSION: No intracranial abnormality. Low-density in the anterior limb internal capsule on the left described at CT is secondary to dilated perivascular spaces, a normal variant.  Right-sided paranasal sinusitis   11/27/17 11/27/17 CT ANGIO NECK W WO CONTRAST IMPRESSION: Negative CT perfusion. Atherosclerotic disease at both carotid bifurcations. 25% stenosis of the proximal ICA on the right. No stenosis on the left. No intracranial finding of significance. Tiny low-density in the anterior limb internal capsule on the left remains indeterminate. This could be an old small vessel insult but I cannot rule out the possibility of a recent small vessel infarction, too small to show up on the perfusion exam.   11/27/17 CT HEAD CODE STROKE WO CONTRAST IMPRESSION: 1. Small area of low-density in the anterior limb internal capsule on the left favored to be old. No high suspicion acute finding. Generalized brain atrophy. 2. ASPECTS is 9.   09/01/12 MR BRAIN WO CONTRAST IMPRESSION: No significant intracranial abnormality.   Past Medical History:  Diagnosis Date   Allergic genetic state    Anemia    Anxiety    Arthritis    Cataract    Chicken pox    Complication of anesthesia    Depression    Family history of adverse reaction to anesthesia    mother - PONV   GERD (gastroesophageal reflux disease)    H/O hiatal hernia    History of GI  bleed    Hypercholesteremia    Hyperlipidemia    Hypothyroidism    Osteoporosis    PONV (postoperative nausea and vomiting)    Stroke ([place]) 11/2017   Thyroid disease    Wears dentures    partial upper, full lower    Past Surgical History:  Procedure Laterality Date   ABDOMINAL HYSTERECTOMY  05/18/2000   total   BRAVO [place] STUDY   05/26/2012   Procedure: BRAVO [place];  Surgeon: Lear Ng, MD;  Location: [facility]OS[facility];  Service: Endoscopy;  Laterality: N/A;   CHOLECYSTECTOMY  05/18/2004   COLONOSCOPY WITH PROPOFOL N/A 10/24/2018   Procedure: COLONOSCOPY WITH PROPOFOL;  Surgeon: Manya Silvas, MD;  Location: [facility] ENDOSCOPY;  Service: Endoscopy;  Laterality: N/A;   detatched retina surgery     ESOPHAGOGASTRODUODENOSCOPY  05/26/2012   Procedure: ESOPHAGOGASTRODUODENOSCOPY (EGD);  Surgeon: Lear Ng, MD;  Location: [facility];  Service: Endoscopy;  Laterality: N/A;   ESOPHAGOGASTRODUODENOSCOPY  05/30/2012   Procedure: ESOPHAGOGASTRODUODENOSCOPY (EGD);  Surgeon: Cleotis Nipper, MD;  Location: [facility];  Service: Endoscopy;  Laterality: N/A;   ESOPHAGOGASTRODUODENOSCOPY N/A 08/31/2012   Procedure: ESOPHAGOGASTRODUODENOSCOPY (EGD);  Surgeon: Wonda Horner, MD;  Location: [facility] ENDOSCOPY;  Service: Endoscopy;  Laterality: N/A;   ESOPHAGOGASTRODUODENOSCOPY (EGD) WITH PROPOFOL N/A 10/24/2018   Procedure: ESOPHAGOGASTRODUODENOSCOPY (EGD) WITH PROPOFOL;  Surgeon: Manya Silvas, MD;  Location: [facility] ENDOSCOPY;  Service: Endoscopy;  Laterality: N/A;   ESOPHAGOGASTRODUODENOSCOPY (EGD) WITH PROPOFOL N/A 08/05/2020   Procedure: ESOPHAGOGASTRODUODENOSCOPY (EGD) WITH PROPOFOL;  Surgeon: Toledo, Benay Pike, MD;  Location: [facility] ENDOSCOPY;  Service: Gastroenterology;  Laterality: N/A;   EYE SURGERY     HIP SURGERY  1970s   "hip sunk in"   NECK SURGERY  05/18/2010   PLANTAR FASCIA RELEASE Left 10/10/2020   Procedure: ENDOSCOPIC PLANTAR FASC. RELEASE;  Surgeon: [name], DPM;  Location: [place];  Service: Podiatry;  Laterality: Left;  ANESTHESIA- CHOICE   ROTATOR CUFF REPAIR  03/18/2012   right side   THYROID SURGERY     nodule removed, right side   TMJ ARTHROPLASTY  05/18/1990   TUBAL LIGATION  05/19/1979     Medications:  Outpatient Encounter Medications as of 05/26/2021  Medication Sig    atorvastatin (LIPITOR) 40 MG tablet Take 40 mg by mouth daily.   Bromfenac Sodium (PROLENSA OP) Apply to eye 3 (three) times a week.   CHOLECALCIFEROL PO Take 2,000 Units by mouth daily.   citalopram (CELEXA) 20 MG tablet Take 20 mg by mouth daily.   clorazepate (TRANXENE) 7.5 MG tablet Take 7.5 mg by mouth See admin instructions. Take 7.5 mg by mouth in the morning and at bedtime. May take an additional during the day if needed.   levothyroxine (SYNTHROID, LEVOTHROID) 25 MCG tablet Take 25 mcg by mouth every morning.    montelukast (SINGULAIR) 10 MG tablet Take 10 mg by mouth at bedtime.   traZODone (DESYREL) 50 MG tablet Take 50 mg by mouth at bedtime.   vitamin B-12 (CYANOCOBALAMIN) 1000 MCG tablet Take 1,000 mcg by mouth daily.   [DISCONTINUED] atorvastatin (LIPITOR) 40 MG tablet Take 1 tablet (40 mg total) by mouth daily.   [DISCONTINUED] betamethasone valerate lotion (VALISONE) 0.1 % Apply 1 application topically daily.   [DISCONTINUED] ondansetron (ZOFRAN) 4 MG tablet Take 1 tablet (4 mg total) by mouth every 8 (eight) hours as needed for nausea or vomiting.   No facility-administered encounter medications on file as of 05/26/2021.    Allergies:  Allergies  Allergen Reactions   Lithium Anaphylaxis   Fentanyl Other (See Comments)    Extreme Sedation Other reaction(s): Lethargy (intolerance) Extreme sedation    Morphine Nausea And Vomiting   Pseudoephedrine Hypertension    Family History: Family History  Problem Relation Age of Onset   Hypertension Mother    Kidney disease Mother    Heart attack Mother    Osteoporosis Mother    Lung cancer Father    Cancer Sister    Leukemia Sister    Colon polyps Sister    Ovarian cancer Paternal Aunt    Ovarian cancer Maternal Grandmother     Social History: Social History   Tobacco Use   Smoking status: Former    Packs/day: 1.50    Years: 20.00    Pack years: 30.00    Types: Cigarettes    Quit date: 02/05/2008    Years  since quitting: 13.3   Smokeless tobacco: Never  Vaping Use   Vaping Use: Never used  Substance Use Topics   Alcohol use: No   Drug use: No   Social History   Social History Narrative   Left Handed    Lives in a townhouse     Vital Signs:  BP 140/73    Pulse 94    Ht 5\' 5"  (1.651 m)    Wt 138 lb (62.6 kg)    SpO2 98%    BMI 22.96 kg/m     Neurological Exam: MENTAL STATUS including orientation to time, place, person, recent and remote memory, attention span and concentration, language, and fund of knowledge is normal.  Speech is not dysarthric.  CRANIAL NERVES: II:  No visual field defects.    III-IV-VI: Pupils equal round and reactive to light.  Normal conjugate, extra-ocular eye movements in all directions of gaze.  No nystagmus.  No ptosis.   V:  Normal facial sensation.    VII:  Normal facial symmetry and movements.   VIII:  Normal hearing and vestibular function.   IX-X:  Normal palatal movement.   XI:  Normal shoulder shrug and head rotation.   XII:  Normal tongue strength and range of motion, no deviation or fasciculation.  MOTOR:  No atrophy, fasciculations or abnormal movements.  No pronator drift.   Upper Extremity:  Right  Left  Deltoid  5/5   5/5   Biceps  5/5   5/5   Triceps  5/5   5/5   Infraspinatus 5/5  5/5  Medial pectoralis 5/5  5/5  Wrist extensors  5/5   5/5   Wrist flexors  5/5   5/5   Finger extensors  5/5   5/5   Finger flexors  5/5   5/5   Dorsal interossei  5/5   5/5   Abductor pollicis  5/5   5/5   Tone (Ashworth scale)  0  0   Lower Extremity:  Right  Left  Hip flexors  5/5   5/5   Hip extensors  5/5   5/5   Adductor 5/5  5/5  Abductor 5/5  5/5  Knee flexors  5/5   5/5   Knee extensors  5/5   5/5   Dorsiflexors  5/5   5/5   Plantarflexors  5/5   5/5   Toe extensors  5/5   5/5   Toe flexors  5/5   5/5   Tone (Ashworth scale)  0  0   MSRs:  Right  Left                  brachioradialis 2+  2+  biceps 2+  2+  triceps 2+  2+   patellar 0  0  ankle jerk 0  0  Hoffman no  no  plantar response down  down   SENSORY:  Absent pin prick throughout arms and legs.  Vibration absent below the ankles. Temperature is inconsistent in the legs.  Romberg's sign absent.   COORDINATION/GAIT: Nonphysiological finger-to- nose-finger (touches forehead).  Intact rapid alternating movements bilaterally.  Able to rise from a chair without using arms.  Gait narrow based and stable. Able to stand on heels and toes. Tandem gait intact.    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 There are aspects to her exam which is nonphysiological, and therefore accurate assessment of true deficits is unable to be obtained  - NCS/EMG of the left arm and leg to help characterize the nature of her symptoms  - Start gabapentin 300mg  at bedtime  Meningioma - MRI brain wwo contrast for surveillance - Patient reassured that meningioma would not cause any deformity of her skull  Polyarthralgia - Follow-up with PCP  Further recommendations pending results.  Thank you for allowing me to participate in patient's care.  If I can answer any additional questions, I would be pleased to do so.    Sincerely,    Rupa Lagan K. Posey Pronto, DO

## 2021-05-23 NOTE — Telephone Encounter (Signed)
-----   Message from Earlie Server, MD sent at 05/22/2021 10:54 PM EST ----- Please refer her to Dr.Toledo for IDA. Thanks. She knows

## 2021-05-26 ENCOUNTER — Ambulatory Visit: Payer: Medicare HMO | Admitting: Neurology

## 2021-05-26 ENCOUNTER — Encounter: Payer: Self-pay | Admitting: Neurology

## 2021-05-26 ENCOUNTER — Inpatient Hospital Stay: Payer: Medicare HMO

## 2021-05-26 ENCOUNTER — Other Ambulatory Visit: Payer: Self-pay

## 2021-05-26 VITALS — BP 108/62 | HR 66 | Temp 98.2°F | Resp 20

## 2021-05-26 VITALS — BP 140/73 | HR 94 | Ht 65.0 in | Wt 138.0 lb

## 2021-05-26 DIAGNOSIS — R202 Paresthesia of skin: Secondary | ICD-10-CM | POA: Diagnosis not present

## 2021-05-26 DIAGNOSIS — D509 Iron deficiency anemia, unspecified: Secondary | ICD-10-CM | POA: Diagnosis not present

## 2021-05-26 DIAGNOSIS — D329 Benign neoplasm of meninges, unspecified: Secondary | ICD-10-CM | POA: Diagnosis not present

## 2021-05-26 DIAGNOSIS — D500 Iron deficiency anemia secondary to blood loss (chronic): Secondary | ICD-10-CM

## 2021-05-26 MED ORDER — GABAPENTIN 300 MG PO CAPS: 300.0000 mg | ORAL_CAPSULE | Freq: Every day | ORAL | 3 refills | Status: DC

## 2021-05-26 MED ORDER — IRON SUCROSE 20 MG/ML IV SOLN
200.0000 mg | Freq: Once | INTRAVENOUS | Status: AC
  Administered 2021-05-26: 200 mg via INTRAVENOUS
  Filled 2021-05-26: qty 10

## 2021-05-26 MED ORDER — SODIUM CHLORIDE 0.9 % IV SOLN: 200.0000 mg | Freq: Once | INTRAVENOUS | Status: DC

## 2021-05-26 MED ORDER — SODIUM CHLORIDE 0.9 % IV SOLN
Freq: Once | INTRAVENOUS | Status: AC
  Filled 2021-05-26: qty 250

## 2021-05-26 NOTE — Patient Instructions (Signed)

## 2021-05-26 NOTE — Patient Instructions (Addendum)
Nerve testing of the left and leg   MRI brain wo contrast   Start gabapentin 300mg  at bedtime  ELECTROMYOGRAM AND NERVE CONDUCTION STUDIES (EMG/NCS) INSTRUCTIONS  How to Prepare The neurologist conducting the EMG will need to know if you have certain medical conditions. Tell the neurologist and other EMG lab personnel if you: Have a pacemaker or any other electrical medical device Take blood-thinning medications Have hemophilia, a blood-clotting disorder that causes prolonged bleeding Bathing Take a shower or bath shortly before your exam in order to remove oils from your skin. Dont apply lotions or creams before the exam.  What to Expect Youll likely be asked to change into a hospital gown for the procedure and lie down on an examination table. The following explanations can help you understand what will happen during the exam.  Electrodes. The neurologist or a technician places surface electrodes at various locations on your skin depending on where youre experiencing symptoms. Or the neurologist may insert needle electrodes at different sites depending on your symptoms.  Sensations. The electrodes will at times transmit a tiny electrical current that you may feel as a twinge or spasm. The needle electrode may cause discomfort or pain that usually ends shortly after the needle is removed. If you are concerned about discomfort or pain, you may want to talk to the neurologist about taking a short break during the exam.  Instructions. During the needle EMG, the neurologist will assess whether there is any spontaneous electrical activity when the muscle is at rest - activity that isnt present in healthy muscle tissue - and the degree of activity when you slightly contract the muscle.  He or she will give you instructions on resting and contracting a muscle at appropriate times. Depending on what muscles and nerves the neurologist is examining, he or she may ask you to change positions during  the exam.  After your EMG You may experience some temporary, minor bruising where the needle electrode was inserted into your muscle. This bruising should fade within several days. If it persists, contact your primary care doctor.

## 2021-05-26 NOTE — Progress Notes (Signed)
Pt tolerated venofer infusion well today with no problems or complaints.  Pt was monitored for 30 minutes post infusion with no complications noted.  Pt left infusion suite stable and ambulatory.

## 2021-05-26 NOTE — Telephone Encounter (Signed)
No further pain medication refills    OTC tylenol as directed for pain

## 2021-05-26 NOTE — Telephone Encounter (Signed)
Patient called and is asking for a refill on Hydrocodone 7.5-325 mg. Medication from [place].     Patient said that she is almost out of the medication.    [facility] on [address]   [phone]    Patient tel. [phone].    Note : patient next appt is on 06/18/21 for the Left knee.

## 2021-05-26 NOTE — Telephone Encounter (Signed)
Patient would like something else other than tylenol. States she has an allergy to it and it makes her really sick.

## 2021-05-30 ENCOUNTER — Other Ambulatory Visit: Payer: Self-pay

## 2021-05-30 ENCOUNTER — Encounter: Payer: Self-pay | Admitting: Emergency Medicine

## 2021-05-30 ENCOUNTER — Emergency Department: Payer: Medicare HMO

## 2021-05-30 ENCOUNTER — Observation Stay
Admission: EM | Admit: 2021-05-30 | Discharge: 2021-05-31 | Disposition: A | Discharge status: Home / Self Care | Payer: Medicare HMO | Attending: Family Medicine | Admitting: Family Medicine

## 2021-05-30 DIAGNOSIS — Z20822 Contact with and (suspected) exposure to covid-19: Secondary | ICD-10-CM | POA: Insufficient documentation

## 2021-05-30 DIAGNOSIS — J189 Pneumonia, unspecified organism: Secondary | ICD-10-CM | POA: Diagnosis not present

## 2021-05-30 DIAGNOSIS — J690 Pneumonitis due to inhalation of food and vomit: Secondary | ICD-10-CM | POA: Diagnosis not present

## 2021-05-30 DIAGNOSIS — K219 Gastro-esophageal reflux disease without esophagitis: Secondary | ICD-10-CM

## 2021-05-30 DIAGNOSIS — D500 Iron deficiency anemia secondary to blood loss (chronic): Secondary | ICD-10-CM | POA: Diagnosis present

## 2021-05-30 DIAGNOSIS — Z87891 Personal history of nicotine dependence: Secondary | ICD-10-CM | POA: Insufficient documentation

## 2021-05-30 DIAGNOSIS — R55 Syncope and collapse: Secondary | ICD-10-CM | POA: Diagnosis present

## 2021-05-30 DIAGNOSIS — E039 Hypothyroidism, unspecified: Secondary | ICD-10-CM | POA: Insufficient documentation

## 2021-05-30 DIAGNOSIS — Z79899 Other long term (current) drug therapy: Secondary | ICD-10-CM | POA: Insufficient documentation

## 2021-05-30 DIAGNOSIS — E860 Dehydration: Secondary | ICD-10-CM | POA: Diagnosis present

## 2021-05-30 DIAGNOSIS — E785 Hyperlipidemia, unspecified: Secondary | ICD-10-CM | POA: Diagnosis present

## 2021-05-30 DIAGNOSIS — E782 Mixed hyperlipidemia: Secondary | ICD-10-CM | POA: Diagnosis not present

## 2021-05-30 DIAGNOSIS — I639 Cerebral infarction, unspecified: Secondary | ICD-10-CM | POA: Diagnosis present

## 2021-05-30 DIAGNOSIS — K449 Diaphragmatic hernia without obstruction or gangrene: Secondary | ICD-10-CM

## 2021-05-30 DIAGNOSIS — R197 Diarrhea, unspecified: Secondary | ICD-10-CM | POA: Diagnosis present

## 2021-05-30 DIAGNOSIS — F39 Unspecified mood [affective] disorder: Secondary | ICD-10-CM | POA: Diagnosis present

## 2021-05-30 DIAGNOSIS — A419 Sepsis, unspecified organism: Secondary | ICD-10-CM | POA: Diagnosis not present

## 2021-05-30 DIAGNOSIS — G9340 Encephalopathy, unspecified: Secondary | ICD-10-CM

## 2021-05-30 DIAGNOSIS — R6521 Severe sepsis with septic shock: Secondary | ICD-10-CM

## 2021-05-30 LAB — COMPREHENSIVE METABOLIC PANEL
ALT: 15 U/L (ref 0–44)
AST: 33 U/L (ref 15–41)
Albumin: 3.4 g/dL — ABNORMAL LOW (ref 3.5–5.0)
Alkaline Phosphatase: 74 U/L (ref 38–126)
Anion gap: 11 (ref 5–15)
BUN: 11 mg/dL (ref 8–23)
CO2: 22 mmol/L (ref 22–32)
Calcium: 8.4 mg/dL — ABNORMAL LOW (ref 8.9–10.3)
Chloride: 102 mmol/L (ref 98–111)
Creatinine, Ser: 0.98 mg/dL (ref 0.44–1.00)
GFR, Estimated: >60 mL/min (ref >60)
Glucose, Bld: 94 mg/dL (ref 70–99)
Potassium: 4 mmol/L (ref 3.5–5.1)
Sodium: 135 mmol/L (ref 135–145)
Total Bilirubin: 1.1 mg/dL (ref 0.3–1.2)
Total Protein: 6.9 g/dL (ref 6.5–8.1)

## 2021-05-30 LAB — CBC
HCT: 30.4 % — ABNORMAL LOW (ref 36.0–46.0)
Hemoglobin: 9.4 g/dL — ABNORMAL LOW (ref 12.0–15.0)
MCH: 27.9 pg (ref 26.0–34.0)
MCHC: 30.9 g/dL (ref 30.0–36.0)
MCV: 90.2 fL (ref 80.0–100.0)
Platelets: 271 10*3/uL (ref 150–400)
RBC: 3.37 MIL/uL — ABNORMAL LOW (ref 3.87–5.11)
RDW: 15.7 % — ABNORMAL HIGH (ref 11.5–15.5)
WBC: 24.8 10*3/uL — ABNORMAL HIGH (ref 4.0–10.5)
nRBC: 0 % (ref 0.0–0.2)

## 2021-05-30 LAB — LACTIC ACID, PLASMA
Lactic Acid, Venous: 2.3 mmol/L (ref 0.5–1.9)
Lactic Acid, Venous: 1.9 mmol/L (ref 0.5–1.9)

## 2021-05-30 LAB — TYPE AND SCREEN
ABO/RH(D): A NEG
Antibody Screen: NEGATIVE

## 2021-05-30 LAB — TROPONIN I (HIGH SENSITIVITY)
Troponin I (High Sensitivity): 3 ng/L (ref <18)
Troponin I (High Sensitivity): 3 ng/L (ref <18)

## 2021-05-30 LAB — PROCALCITONIN: Procalcitonin: 0.97 ng/mL

## 2021-05-30 MED ORDER — MONTELUKAST SODIUM 10 MG PO TABS
10.0000 mg | ORAL_TABLET | Freq: Every day | ORAL | Status: DC
  Administered 2021-05-31: 10 mg via ORAL
  Filled 2021-05-30: qty 1

## 2021-05-30 MED ORDER — ONDANSETRON HCL 4 MG PO TABS: 4.0000 mg | ORAL_TABLET | Freq: Four times a day (QID) | ORAL | Status: DC | PRN

## 2021-05-30 MED ORDER — ACETAMINOPHEN 325 MG RE SUPP: 650.0000 mg | Freq: Four times a day (QID) | RECTAL | Status: DC | PRN

## 2021-05-30 MED ORDER — GABAPENTIN 300 MG PO CAPS
300.0000 mg | ORAL_CAPSULE | Freq: Every day | ORAL | Status: DC
  Administered 2021-05-31: 300 mg via ORAL
  Filled 2021-05-30: qty 1

## 2021-05-30 MED ORDER — ACETAMINOPHEN 325 MG PO TABS
650.0000 mg | ORAL_TABLET | Freq: Four times a day (QID) | ORAL | Status: DC | PRN
  Administered 2021-05-31: 650 mg via ORAL
  Filled 2021-05-30: qty 2

## 2021-05-30 MED ORDER — METRONIDAZOLE 500 MG/100ML IV SOLN
500.0000 mg | Freq: Once | INTRAVENOUS | Status: AC
  Administered 2021-05-30: 500 mg via INTRAVENOUS
  Filled 2021-05-30: qty 100

## 2021-05-30 MED ORDER — LACTATED RINGERS IV SOLN: INTRAVENOUS | Status: DC

## 2021-05-30 MED ORDER — ONDANSETRON HCL 4 MG/2ML IJ SOLN: 4.0000 mg | Freq: Four times a day (QID) | INTRAMUSCULAR | Status: DC | PRN

## 2021-05-30 MED ORDER — SODIUM CHLORIDE 0.9 % IV SOLN
2.0000 g | INTRAVENOUS | Status: DC
  Administered 2021-05-30: 2 g via INTRAVENOUS
  Filled 2021-05-30: qty 20

## 2021-05-30 MED ORDER — ENOXAPARIN SODIUM 40 MG/0.4ML IJ SOSY
40.0000 mg | PREFILLED_SYRINGE | INTRAMUSCULAR | Status: DC
  Administered 2021-05-31: 40 mg via SUBCUTANEOUS
  Filled 2021-05-30: qty 0.4

## 2021-05-30 MED ORDER — LEVOTHYROXINE SODIUM 50 MCG PO TABS
25.0000 ug | ORAL_TABLET | Freq: Every day | ORAL | Status: DC
  Administered 2021-05-31: 25 ug via ORAL
  Filled 2021-05-30: qty 1

## 2021-05-30 MED ORDER — LACTATED RINGERS IV BOLUS (SEPSIS)
1000.0000 mL | Freq: Once | INTRAVENOUS | Status: AC
  Administered 2021-05-30: 1000 mL via INTRAVENOUS

## 2021-05-30 MED ORDER — TRAZODONE HCL 50 MG PO TABS: 50.0000 mg | ORAL_TABLET | Freq: Every evening | ORAL | Status: DC | PRN

## 2021-05-30 MED ORDER — VITAMIN B-12 1000 MCG PO TABS
1000.0000 ug | ORAL_TABLET | Freq: Every day | ORAL | Status: DC
  Administered 2021-05-31: 1000 ug via ORAL
  Filled 2021-05-30: qty 1

## 2021-05-30 MED ORDER — CITALOPRAM HYDROBROMIDE 20 MG PO TABS
20.0000 mg | ORAL_TABLET | Freq: Every day | ORAL | Status: DC
Start: 2021-05-31 — End: 2021-05-31
  Administered 2021-05-31: 20 mg via ORAL
  Filled 2021-05-30: qty 1

## 2021-05-30 MED ORDER — SODIUM CHLORIDE 0.9 % IV SOLN
500.0000 mg | INTRAVENOUS | Status: DC
  Administered 2021-05-30: 500 mg via INTRAVENOUS
  Filled 2021-05-30: qty 5

## 2021-05-30 MED ORDER — ATORVASTATIN CALCIUM 20 MG PO TABS
40.0000 mg | ORAL_TABLET | Freq: Every day | ORAL | Status: DC
  Administered 2021-05-31: 40 mg via ORAL
  Filled 2021-05-30: qty 2

## 2021-05-30 NOTE — H&P (Addendum)
History and Physical   Brittany [name] VQM:[id number] DOB: January 10, 1951 DOA: 05/30/2021  PCP: Derinda Late, MD  Outpatient Specialists: Dr. Tasia Catchings, oncology Patient coming from: home via EMS  I have personally briefly reviewed patient's old medical records in [place].  Chief Concern: Syncopal   HPI: Brittany [name] is a 71 y.o. female with medical history significant for hypothyroid, depression, anxiety, hyperlipidemia, neuropathy, seasonal allergies, who presents emergency department for chief concerns of a fall.  At bedside patient is able to tell me her name, her age, the current calendar year and location.  She passed out 11/11:30 AM on day of admission. She was bringing in her garbage can from the street and was going into the garage when she passed out. She reports this has never happened before. She does not know how long she was able to call EMS. She crawled into the house. She endorses left knee pain.   Note: on AM of presentation, she woke up with stomach contents in her mouth. She states this has happened before due to hiatal hernia and gerd.   She endorses diarrhea, yellow, loose stool, 4-5x, that has been ongoing since November, 2022. She has not had a colonoscopy and endoscopy and is scheduled to have a colonoscopy August 14, 2021.  She denies fever. She endorses cough. She denies chest pain, abdominal pain, new shortness of breath. She denies dysuria.   Social history: She lives by herself. She is divorced. 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 She denies etoh, tobacco use, recreational drug use. She formerly worked as a Chief Technology Officer.   Vaccination history: She is vaccinated for covid 19, two doses.   ROS: Constitutional: no weight change, no fever ENT/Mouth: no sore throat, no rhinorrhea Eyes: no eye pain, no vision changes Cardiovascular: no chest pain, no dyspnea,  no edema, no palpitations Respiratory: +cough, no sputum, no  wheezing Gastrointestinal: no nausea, no vomiting, no diarrhea, no constipation Genitourinary: no urinary incontinence, no dysuria, no hematuria Musculoskeletal: no arthralgias, no myalgias Skin: no skin lesions, no pruritus, Neuro: + weakness, + loss of consciousness, + syncope Psych: no anxiety, no depression, + decrease appetite Heme/Lymph: no bruising, no bleeding  ED Course: Discussed with emergency medicine provider, patient requiring hospitalization for chief concerns of meeting sepsis criteria.  Vitals in the emergency department showed temperature 100.4, respiration rate of 18, heart rate of 103, blood pressure initially 107/58, improved to 95/54 (map 66) at bedside.  Patient had SPO2 of 95% on room air. Patient was given 1 L sodium chloride bolus.  Labs in the emergency department showed serum sodium 135, potassium 4.0, chloride 102, bicarb 22, BUN of 11, serum creatinine of 0.98, nonfasting blood glucose 94, GFR greater than 60, high sensitive troponin was 3, lactic acid is 2.3, and increased to 1.9, procalcitonin is 0.97, WBC was elevated at 24.8, hemoglobin 9.4, platelets of 271.  Assessment/Plan  Principal Problem:   Community acquired pneumonia Active Problems:   G E REFLUX   CVA (cerebral vascular accident) ([place])   Hyperlipidemia   # Syncopal event-I suspect this is secondary to aspiration pneumonia overnight # Community acquired pneumonia # At risk for aspiration pneumonia # Marked leukocytosis 3 Patient met sepsis shock criteria with increased heart rate, hypotension, responded to fluid, leukocytosis and elevated lactic acid - Continue azithromycin and ceftriaxone per EDP order - Continue metronidazole for anaerobic coverage - Check blood cultures x2  # Hyperlipidemia-atorvastatin 40 mg nightly  # Hypothyroid-resumed home levothyroxine 25  mcg daily  # Insomnia/depression and anxiety-resumed trazodone 50 mg nightly as needed for sleep - Resumed home  citalopram 20 mg daily  # Neuropathy-resumed home gabapentin 300 mg nightly  # Diarrhea -appears to be chronic, recommended follow-up with GI and keep the colonoscopy and endoscopy appointments in March 2023  # Iron deficiency anemia-continue outpatient follow-up with Dr. Tasia Catchings  Chart reviewed.   DVT prophylaxis: Enoxaparin Code Status: Limited, do not do chest compression. Can do intubation Diet: Heart healthy Family Communication: She states family know she is here and does not need update Disposition Plan: Pending clinical course Consults called: None at this time Admission status: telemetry cardiac, observation  Past Medical History:  Diagnosis Date   Allergic genetic state    Anemia    Anxiety    Arthritis    Cataract    Chicken pox    Complication of anesthesia    Depression    Family history of adverse reaction to anesthesia    mother - PONV   GERD (gastroesophageal reflux disease)    H/O hiatal hernia    History of GI bleed    Hypercholesteremia    Hyperlipidemia    Hypothyroidism    Osteoporosis    PONV (postoperative nausea and vomiting)    Stroke ([place]) 11/2017   Thyroid disease    Wears dentures    partial upper, full lower   Past Surgical History:  Procedure Laterality Date   ABDOMINAL HYSTERECTOMY  05/18/2000   total   BRAVO [place] STUDY  05/26/2012   Procedure: BRAVO [place] STUDY;  Surgeon: Lear Ng, MD;  Location: [facility] ENDOSCOPY;  Service: Endoscopy;  Laterality: N/A;   CHOLECYSTECTOMY  05/18/2004   COLONOSCOPY WITH PROPOFOL N/A 10/24/2018   Procedure: COLONOSCOPY WITH PROPOFOL;  Surgeon: Manya Silvas, MD;  Location: [facility] ENDOSCOPY;  Service: Endoscopy;  Laterality: N/A;   detatched retina surgery     ESOPHAGOGASTRODUODENOSCOPY  05/26/2012   Procedure: ESOPHAGOGASTRODUODENOSCOPY (EGD);  Surgeon: Lear Ng, MD;  Location: [facility];  Service: Endoscopy;  Laterality: N/A;   ESOPHAGOGASTRODUODENOSCOPY  05/30/2012   Procedure:  ESOPHAGOGASTRODUODENOSCOPY (EGD);  Surgeon: Cleotis Nipper, MD;  Location: [facility];  Service: Endoscopy;  Laterality: N/A;   ESOPHAGOGASTRODUODENOSCOPY N/A 08/31/2012   Procedure: ESOPHAGOGASTRODUODENOSCOPY (EGD);  Surgeon: Wonda Horner, MD;  Location: [facility] ENDOSCOPY;  Service: Endoscopy;  Laterality: N/A;   ESOPHAGOGASTRODUODENOSCOPY (EGD) WITH PROPOFOL N/A 10/24/2018   Procedure: ESOPHAGOGASTRODUODENOSCOPY (EGD) WITH PROPOFOL;  Surgeon: Manya Silvas, MD;  Location: [facility] ENDOSCOPY;  Service: Endoscopy;  Laterality: N/A;   ESOPHAGOGASTRODUODENOSCOPY (EGD) WITH PROPOFOL N/A 08/05/2020   Procedure: ESOPHAGOGASTRODUODENOSCOPY (EGD) WITH PROPOFOL;  Surgeon: Tol[place], Benay Pike, MD;  Location: [facility] ENDOSCOPY;  Service: Gastroenterology;  Laterality: N/A;   EYE SURGERY     HIP SURGERY  1970s   "hip sunk in"   NECK SURGERY  05/18/2010   PLANTAR FASCIA RELEASE Left 10/10/2020   Procedure: ENDOSCOPIC PLANTAR FASC. RELEASE;  Surgeon: [name], DPM;  Location: [place];  Service: Podiatry;  Laterality: Left;  ANESTHESIA- CHOICE   ROTATOR CUFF REPAIR  03/18/2012   right side   THYROID SURGERY     nodule removed, right side   TMJ ARTHROPLASTY  05/18/1990   TUBAL LIGATION  05/19/1979   Social History:  reports that she quit smoking about 13 years ago. Her smoking use included cigarettes. She has a 30.00 pack-year smoking history. She has never used smokeless tobacco. She reports that she does not drink alcohol and does  not use drugs.  Allergies  Allergen Reactions   Lithium Anaphylaxis   Fentanyl Other (See Comments)    Extreme Sedation Other reaction(s): Lethargy (intolerance) Extreme sedation    Morphine Nausea And Vomiting   Pseudoephedrine Hypertension   Family History  Problem Relation Age of Onset   Hypertension Mother    Kidney disease Mother    Heart attack Mother    Osteoporosis Mother    Lung cancer Father    Cancer Sister    Leukemia Sister    Colon  polyps Sister    Ovarian cancer Paternal Aunt    Ovarian cancer Maternal Grandmother    Family history: Family history reviewed and not pertinent  Prior to Admission medications   Medication Sig Start Date End Date Taking? Authorizing Provider  atorvastatin (LIPITOR) 40 MG tablet Take 40 mg by mouth daily.    [provider]  Bromfenac Sodium (PROLENSA OP) Apply to eye 3 (three) times a week.    [provider]  CHOLECALCIFEROL PO Take 2,000 Units by mouth daily.    [provider]  citalopram (CELEXA) 20 MG tablet Take 20 mg by mouth daily. 12/31/20   [provider]  clorazepate (TRANXENE) 7.5 MG tablet Take 7.5 mg by mouth See admin instructions. Take 7.5 mg by mouth in the morning and at bedtime. May take an additional during the day if needed.    [provider]  gabapentin (NEURONTIN) 300 MG capsule Take 1 capsule (300 mg total) by mouth at bedtime. 05/26/21   Narda Amber K, DO  levothyroxine (SYNTHROID, LEVOTHROID) 25 MCG tablet Take 25 mcg by mouth every morning.     [provider]  montelukast (SINGULAIR) 10 MG tablet Take 10 mg by mouth at bedtime.    [provider]  traZODone (DESYREL) 50 MG tablet Take 50 mg by mouth at bedtime.    [provider]  vitamin B-12 (CYANOCOBALAMIN) 1000 MCG tablet Take 1,000 mcg by mouth daily.    [provider]   Physical Exam: Vitals:   05/30/21 1732 05/30/21 1957 05/30/21 2113 05/30/21 2117  BP:  (!) 95/56 (!) 114/50 (!) 114/50  Pulse:  85  82  Resp:  14  12  Temp:      TempSrc:      SpO2:  97%  98%  Weight: 62.6 kg     Height: 5' (1.524 m)      Constitutional: appears age-appropriate, frail, NAD, calm, comfortable Eyes: PERRL, lids and conjunctivae normal ENMT: Mucous membranes are moist. Posterior pharynx clear of any exudate or lesions. Age-appropriate dentition. Hearing appropriate. Neck: normal, supple, no masses, no thyromegaly Respiratory: clear to  auscultation bilaterally, no wheezing, no crackles. Normal respiratory effort. No accessory muscle use.  Cardiovascular: Regular rate and rhythm, no murmurs / rubs / gallops. No extremity edema. 2+ pedal pulses. No carotid bruits.  Abdomen: no tenderness, no masses palpated, no hepatosplenomegaly. Bowel sounds positive.  Musculoskeletal: no clubbing / cyanosis. No joint deformity upper and lower extremities. Good ROM, no contractures, no atrophy. Normal muscle tone.  Skin: no rashes, lesions, ulcers. No induration Neurologic: Sensation intact. Strength 5/5 in all 4.  Psychiatric: Normal judgment and insight. Alert and oriented x 3. Normal mood.   EKG: independently reviewed, showing sinus rhythm with rate of 100, QTc 461  Chest x-ray on Admission: I personally reviewed and I agree with radiologist reading as below.  DG Chest 2 View  Result Date: 05/30/2021 CLINICAL DATA:  Syncope, fall EXAM:  CHEST - 2 VIEW COMPARISON:  Chest radiographs done on 06/26/2014 FINDINGS: Cardiac size is within normal limits. There are no signs of pulmonary edema. There is patchy infiltrate in the right upper lobe which was not seen in the previous study done on 06/26/2014. there is previous surgical fusion in the lower cervical spine. IMPRESSION: New patchy infiltrates are seen in the right upper lung fields, possibly suggesting pneumonia. Part of this finding may suggest underlying scarring. There are no signs of alveolar pulmonary edema. There is no pleural effusion or pneumothorax. Electronically Signed   By: Elmer Picker M.D.   On: 05/30/2021 18:16   DG Ankle Complete Left  Result Date: 05/30/2021 CLINICAL DATA:  Trauma, fall EXAM: LEFT ANKLE COMPLETE - 3+ VIEW COMPARISON:  None. FINDINGS: No recent fracture or dislocation is seen. There is 2 mm faint smooth marginated calcification adjacent to the base of fifth metatarsal which may be residual from previous injury. There are scattered arterial calcifications  in the soft tissues. IMPRESSION: No recent fracture or dislocation is seen. There is 2 mm smooth marginated calcification adjacent to the base of left fifth metatarsal, possibly suggesting old avulsion. Electronically Signed   By: Elmer Picker M.D.   On: 05/30/2021 18:19   CT Head Wo Contrast  Result Date: 05/30/2021 CLINICAL DATA:  Syncopal episode. EXAM: CT HEAD WITHOUT CONTRAST TECHNIQUE: Contiguous axial images were obtained from the base of the skull through the vertex without intravenous contrast. RADIATION DOSE REDUCTION: This exam was performed according to the departmental dose-optimization program which includes automated exposure control, adjustment of the mA and/or kV according to patient size and/or use of iterative reconstruction technique. COMPARISON:  November 27, 2017 FINDINGS: Brain: There is mild cerebral atrophy with widening of the extra-axial spaces and ventricular dilatation. There are areas of decreased attenuation within the white matter tracts of the supratentorial brain, consistent with microvascular disease changes. Vascular: No hyperdense vessel or unexpected calcification. Skull: Normal. Negative for fracture or focal lesion. Sinuses/Orbits: No acute finding. Other: None. IMPRESSION: 1. Generalized cerebral atrophy. 2. No acute intracranial abnormality. Electronically Signed   By: Virgina Norfolk M.D.   On: 05/30/2021 19:16   CT Cervical Spine Wo Contrast  Result Date: 05/30/2021 CLINICAL DATA:  Syncopal episode. EXAM: CT CERVICAL SPINE WITHOUT CONTRAST TECHNIQUE: Multidetector CT imaging of the cervical spine was performed without intravenous contrast. Multiplanar CT image reconstructions were also generated. RADIATION DOSE REDUCTION: This exam was performed according to the departmental dose-optimization program which includes automated exposure control, adjustment of the mA and/or kV according to patient size and/or use of iterative reconstruction technique. COMPARISON:   None. FINDINGS: Alignment: Normal. Skull base and vertebrae: No acute fracture. A metallic density fusion plate and screws are seen along the anterior aspects of the C5 and C6 vertebral bodies with additional operative material noted within the C5-C6 intervertebral disc space. No primary bone lesion or focal pathologic process. Soft tissues and spinal canal: No prevertebral fluid or swelling. No visible canal hematoma. Disc levels: Mild endplate sclerosis is seen at the levels of C2-C3, C3-C4 and C4-C5. There is marked severity narrowing of the anterior atlantoaxial articulation. Mild multilevel intervertebral disc space narrowing is seen throughout the cervical spine with postoperative changes noted at the level of C5-C6. Moderate severity multilevel facet joint hypertrophy is noted. Bilateral, this is most prominent at the levels of C3-C4 and C4-C5. Upper chest: Mild biapical scarring and/or atelectasis is seen. Other: None. IMPRESSION: 1. No acute fracture or subluxation in the  cervical spine. 2. Postoperative changes at the level of C5-C6. 3. Multilevel degenerative changes throughout the cervical spine, as described above. Electronically Signed   By: Virgina Norfolk M.D.   On: 05/30/2021 19:20   CT Thoracic Spine Wo Contrast  Result Date: 05/30/2021 CLINICAL DATA:  Headache with neck and upper back pain EXAM: CT THORACIC SPINE WITHOUT CONTRAST TECHNIQUE: Multidetector CT images of the thoracic were obtained using the standard protocol without intravenous contrast. RADIATION DOSE REDUCTION: This exam was performed according to the departmental dose-optimization program which includes automated exposure control, adjustment of the mA and/or kV according to patient size and/or use of iterative reconstruction technique. COMPARISON:  None. FINDINGS: Alignment: Normal. Vertebrae: No acute fracture or focal pathologic process. Paraspinal and other soft tissues: Calcific aortic atherosclerosis. Reticular opacities  in the right upper lobe. Disc levels: No spinal canal stenosis. IMPRESSION: 1. No acute fracture or static subluxation of the thoracic spine. 2. Reticular opacities in the right upper lobe, concerning for infection Aortic Atherosclerosis (ICD10-I70.0). Electronically Signed   By: Ulyses Jarred M.D.   On: 05/30/2021 19:33   DG Knee Complete 4 Views Left  Result Date: 05/30/2021 CLINICAL DATA:  Eight no thick rod-like 2 minutes left gynecology soon [place] but EXAM: LEFT KNEE - COMPLETE 4+ VIEW COMPARISON:  None. FINDINGS: Minimal peripheral medial compartment degenerative spurring. Mild-to-moderate patellofemoral joint space narrowing with mild superior and inferior patellar degenerative osteophytosis. Very small joint effusion. There are well corticated ossicles measuring approximately 7 mm and 6 mm, respectively overlying the posterior knee joint on lateral view, possibly small loose bodies. No definite acute fracture is seen. No dislocation. IMPRESSION: No acute fracture is seen. 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 Electronically Signed   By: Yvonne Kendall M.D.   On: 05/30/2021 18:14   DG Foot Complete Left  Result Date: 05/30/2021 CLINICAL DATA:  Trauma, fall EXAM: LEFT FOOT - COMPLETE 3+ VIEW COMPARISON:  None. FINDINGS: No recent fracture or dislocation is seen. Degenerative changes are noted with bony spurs in first metatarsophalangeal joint. Arterial calcifications are seen in the soft tissues. IMPRESSION: No recent fracture or dislocation is seen in the left foot. Degenerative changes are noted in first metatarsophalangeal joint. Electronically Signed   By: Elmer Picker M.D.   On: 05/30/2021 18:17    Labs on Admission: I have personally reviewed following labs  CBC: Recent Labs  Lab 05/30/21 1731  WBC 24.8*  HGB 9.4*  HCT 30.4*  MCV 90.2  PLT 622   Basic Metabolic Panel: Recent Labs  Lab 05/30/21 1739  NA 135  K 4.0  CL 102  CO2 22  GLUCOSE 94  BUN 11   CREATININE 0.98  CALCIUM 8.4*   GFR: Estimated Creatinine Clearance: 44.7 mL/min (by C-G formula based on SCr of 0.98 mg/dL).  Liver Function Tests: Recent Labs  Lab 05/30/21 1739  AST 33  ALT 15  ALKPHOS 74  BILITOT 1.1  PROT 6.9  ALBUMIN 3.4*   Urine analysis:    Component Value Date/Time   COLORURINE COLORLESS (A) 11/27/2017 1700   APPEARANCEUR CLEAR (A) 11/27/2017 1700   LABSPEC 1.019 11/27/2017 1700   PHURINE 6.0 11/27/2017 1700   GLUCOSEU NEGATIVE 11/27/2017 1700   HGBUR NEGATIVE 11/27/2017 1700   BILIRUBINUR NEGATIVE 11/27/2017 1700   KETONESUR NEGATIVE 11/27/2017 1700   PROTEINUR NEGATIVE 11/27/2017 1700   NITRITE NEGATIVE 11/27/2017 1700   LEUKOCYTESUR NEGATIVE 11/27/2017 1700   CRITICAL CARE Performed by: Briant Cedar Leisa Gault  Total critical care time: 35 minutes  Critical care time was exclusive of separately billable procedures and treating other patients.  Critical care was necessary to treat or prevent imminent or life-threatening deterioration.  Critical care was time spent personally by me on the following activities: development of treatment plan with patient and/or surrogate as well as nursing, discussions with consultants, evaluation of patient's response to treatment, examination of patient, obtaining history from patient or surrogate, ordering and performing treatments and interventions, ordering and review of laboratory studies, ordering and review of radiographic studies, pulse oximetry and re-evaluation of patient's condition.  Dr. Tobie Poet [facility]  If 7PM-7AM, please contact overnight-coverage provider If 7AM-7PM, please contact day coverage provider [email or web address]  05/30/2021, 11:51 PM

## 2021-05-30 NOTE — Sepsis Progress Note (Signed)
Monitoring for the code sepsis protocol. °

## 2021-05-30 NOTE — Sepsis Progress Note (Signed)
Notified bedside nurse of need to draw blood cultures.  

## 2021-05-30 NOTE — Consult Note (Signed)
CODE SEPSIS - PHARMACY COMMUNICATION  **Broad Spectrum Antibiotics should be administered within 1 hour of Sepsis diagnosis**  Time Code Sepsis Called/Page Received: 2012  Antibiotics Ordered: ceftriaxone and azithromycin  Time of 1st antibiotic administration: 2055  Additional action taken by pharmacy: none  If necessary, Name of Provider/Nurse Contacted: n/a    Keshara Kiger Rodriguez-Guzman PharmD, BCPS 05/30/2021 9:13 PM

## 2021-05-30 NOTE — ED Provider Notes (Signed)
[facility] Provider Note    Event Date/Time   First MD Initiated Contact with Patient 05/30/21 1945     (approximate)   History   Loss of Consciousness   HPI Brittany [name] is a 71 y.o. female with a stated past medical history of hiatal hernia and recurrent "GERD attacks" who presents after a syncopal episode in her garage today.  Patient states that she stood up from bending over and became lightheaded and then woke up on the ground.  Of note patient also states that over the past week she has had an episode of waking up to vomitus in her mouth as well as this morning waking up to the sensation of breathing in vomitus.  Patient states that this is happened to her in the past and has developed pneumonia from this previously.  Patient does not endorse any shortness of breath at this time but does endorse upper anterior chest pain.     Physical Exam   Triage Vital Signs: ED Triage Vitals  Enc Vitals Group     BP 05/30/21 1731 (!) 107/58     Pulse Rate 05/30/21 1731 (!) 103     Resp 05/30/21 1731 18     Temp 05/30/21 1731 (!) 100.4 F (38 C)     Temp Source 05/30/21 1731 Oral     SpO2 05/30/21 1728 97 %     Weight 05/30/21 1730 137 lb 12.6 oz (62.5 kg)     Height 05/30/21 1730 5\' 5"  (1.651 m)     Head Circumference --      Peak Flow --      Pain Score 05/30/21 1729 0     Pain Loc --      Pain Edu? --      Excl. in [place]? --     Most recent vital signs: Vitals:   05/30/21 2113 05/30/21 2117  BP: (!) 114/50 (!) 114/50  Pulse:  82  Resp:  12  Temp:    SpO2:  98%    General: Awake, no distress.  CV:  Good peripheral perfusion. Resp:  Normal effort.  Rales over bilateral upper lung fields Abd:  No distention.  Other:  Elderly Caucasian female appears stated age in bed in no distress   ED Results / Procedures / Treatments   Labs (all labs ordered are listed, but only abnormal results are displayed) Labs Reviewed  CBC - Abnormal; Notable  for the following components:      Result Value   WBC 24.8 (*)    RBC 3.37 (*)    Hemoglobin 9.4 (*)    HCT 30.4 (*)    RDW 15.7 (*)    All other components within normal limits  COMPREHENSIVE METABOLIC PANEL - Abnormal; Notable for the following components:   Calcium 8.4 (*)    Albumin 3.4 (*)    All other components within normal limits  LACTIC ACID, PLASMA - Abnormal; Notable for the following components:   Lactic Acid, Venous 2.3 (*)    All other components within normal limits  CULTURE, BLOOD (ROUTINE X 2)  CULTURE, BLOOD (ROUTINE X 2)  LACTIC ACID, PLASMA  PROCALCITONIN  URINALYSIS, ROUTINE W REFLEX MICROSCOPIC  PROCALCITONIN  HIV ANTIBODY (ROUTINE TESTING W REFLEX)  BASIC METABOLIC PANEL  CBC  CBG MONITORING, ED  TYPE AND SCREEN  TROPONIN I (HIGH SENSITIVITY)  TROPONIN I (HIGH SENSITIVITY)     EKG ED ECG REPORT I, Naaman Plummer, the attending  physician, personally viewed and interpreted this ECG.  Date: 05/30/2021 EKG Time: 1738 Rate: 100 Rhythm: Tachycardic sinus rhythm QRS Axis: normal Intervals: normal ST/T Wave abnormalities: normal Narrative Interpretation: Tachycardic sinus rhythm.  No evidence of acute ischemia   RADIOLOGY ED MD interpretation: 2 view chest x-ray shows new patchy infiltrates in the upper right lung fields suggesting pneumonia  Three-view x-ray of the left ankle, left knee, and left foot shows no evidence of acute abnormalities  CT of the head without contrast shows no evidence of acute abnormalities including no intracerebral hemorrhage, obvious masses, or significant edema  CT of the cervical spine does not show any evidence of acute abnormalities including no acute fracture, malalignment, height loss, or dislocation  CT of the thoracic spine does not show any evidence of acute abnormalities  Official radiology report(s): DG Chest 2 View  Result Date: 05/30/2021 CLINICAL DATA:  Syncope, fall EXAM: CHEST - 2 VIEW COMPARISON:   Chest radiographs done on 06/26/2014 FINDINGS: Cardiac size is within normal limits. There are no signs of pulmonary edema. There is patchy infiltrate in the right upper lobe which was not seen in the previous study done on 06/26/2014. there is previous surgical fusion in the lower cervical spine. IMPRESSION: New patchy infiltrates are seen in the right upper lung fields, possibly suggesting pneumonia. Part of this finding may suggest underlying scarring. There are no signs of alveolar pulmonary edema. There is no pleural effusion or pneumothorax. Electronically Signed   By: Elmer Picker M.D.   On: 05/30/2021 18:16   DG Ankle Complete Left  Result Date: 05/30/2021 CLINICAL DATA:  Trauma, fall EXAM: LEFT ANKLE COMPLETE - 3+ VIEW COMPARISON:  None. FINDINGS: No recent fracture or dislocation is seen. There is 2 mm faint smooth marginated calcification adjacent to the base of fifth metatarsal which may be residual from previous injury. There are scattered arterial calcifications in the soft tissues. IMPRESSION: No recent fracture or dislocation is seen. There is 2 mm smooth marginated calcification adjacent to the base of left fifth metatarsal, possibly suggesting old avulsion. Electronically Signed   By: Elmer Picker M.D.   On: 05/30/2021 18:19   CT Head Wo Contrast  Result Date: 05/30/2021 CLINICAL DATA:  Syncopal episode. EXAM: CT HEAD WITHOUT CONTRAST TECHNIQUE: Contiguous axial images were obtained from the base of the skull through the vertex without intravenous contrast. RADIATION DOSE REDUCTION: This exam was performed according to the departmental dose-optimization program which includes automated exposure control, adjustment of the mA and/or kV according to patient size and/or use of iterative reconstruction technique. COMPARISON:  November 27, 2017 FINDINGS: Brain: There is mild cerebral atrophy with widening of the extra-axial spaces and ventricular dilatation. There are areas of decreased  attenuation within the white matter tracts of the supratentorial brain, consistent with microvascular disease changes. Vascular: No hyperdense vessel or unexpected calcification. Skull: Normal. Negative for fracture or focal lesion. Sinuses/Orbits: No acute finding. Other: None. IMPRESSION: 1. Generalized cerebral atrophy. 2. No acute intracranial abnormality. Electronically Signed   By: Virgina Norfolk M.D.   On: 05/30/2021 19:16   CT Cervical Spine Wo Contrast  Result Date: 05/30/2021 CLINICAL DATA:  Syncopal episode. EXAM: CT CERVICAL SPINE WITHOUT CONTRAST TECHNIQUE: Multidetector CT imaging of the cervical spine was performed without intravenous contrast. Multiplanar CT image reconstructions were also generated. RADIATION DOSE REDUCTION: This exam was performed according to the departmental dose-optimization program which includes automated exposure control, adjustment of the mA and/or kV according to patient size and/or use  of iterative reconstruction technique. COMPARISON:  None. FINDINGS: Alignment: Normal. Skull base and vertebrae: No acute fracture. A metallic density fusion plate and screws are seen along the anterior aspects of the C5 and C6 vertebral bodies with additional operative material noted within the C5-C6 intervertebral disc space. No primary bone lesion or focal pathologic process. Soft tissues and spinal canal: No prevertebral fluid or swelling. No visible canal hematoma. Disc levels: Mild endplate sclerosis is seen at the levels of C2-C3, C3-C4 and C4-C5. There is marked severity narrowing of the anterior atlantoaxial articulation. Mild multilevel intervertebral disc space narrowing is seen throughout the cervical spine with postoperative changes noted at the level of C5-C6. Moderate severity multilevel facet joint hypertrophy is noted. Bilateral, this is most prominent at the levels of C3-C4 and C4-C5. Upper chest: Mild biapical scarring and/or atelectasis is seen. Other: None.  IMPRESSION: 1. No acute fracture or subluxation in the cervical spine. 2. Postoperative changes at the level of C5-C6. 3. Multilevel degenerative changes throughout the cervical spine, as described above. Electronically Signed   By: Virgina Norfolk M.D.   On: 05/30/2021 19:20   CT Thoracic Spine Wo Contrast  Result Date: 05/30/2021 CLINICAL DATA:  Headache with neck and upper back pain EXAM: CT THORACIC SPINE WITHOUT CONTRAST TECHNIQUE: Multidetector CT images of the thoracic were obtained using the standard protocol without intravenous contrast. RADIATION DOSE REDUCTION: This exam was performed according to the departmental dose-optimization program which includes automated exposure control, adjustment of the mA and/or kV according to patient size and/or use of iterative reconstruction technique. COMPARISON:  None. FINDINGS: Alignment: Normal. Vertebrae: No acute fracture or focal pathologic process. Paraspinal and other soft tissues: Calcific aortic atherosclerosis. Reticular opacities in the right upper lobe. Disc levels: No spinal canal stenosis. IMPRESSION: 1. No acute fracture or static subluxation of the thoracic spine. 2. Reticular opacities in the right upper lobe, concerning for infection Aortic Atherosclerosis (ICD10-I70.0). Electronically Signed   By: Ulyses Jarred M.D.   On: 05/30/2021 19:33   DG Knee Complete 4 Views Left  Result Date: 05/30/2021 CLINICAL DATA:  Eight no thick rod-like 2 minutes left gynecology soon Legrand [place] but EXAM: LEFT KNEE - COMPLETE 4+ VIEW COMPARISON:  None. FINDINGS: Minimal peripheral medial compartment degenerative spurring. Mild-to-moderate patellofemoral joint space narrowing with mild superior and inferior patellar degenerative osteophytosis. Very small joint effusion. There are well corticated ossicles measuring approximately 7 mm and 6 mm, respectively overlying the posterior knee joint on lateral view, possibly small loose bodies. No definite acute fracture is  seen. No dislocation. IMPRESSION: No acute fracture is seen. 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 Electronically Signed   By: Yvonne Kendall M.D.   On: 05/30/2021 18:14   DG Foot Complete Left  Result Date: 05/30/2021 CLINICAL DATA:  Trauma, fall EXAM: LEFT FOOT - COMPLETE 3+ VIEW COMPARISON:  None. FINDINGS: No recent fracture or dislocation is seen. Degenerative changes are noted with bony spurs in first metatarsophalangeal joint. Arterial calcifications are seen in the soft tissues. IMPRESSION: No recent fracture or dislocation is seen in the left foot. Degenerative changes are noted in first metatarsophalangeal joint. Electronically Signed   By: Elmer Picker M.D.   On: 05/30/2021 18:17      PROCEDURES:  Critical Care performed: No  Procedures   MEDICATIONS ORDERED IN ED: Medications  lactated ringers infusion (has no administration in time range)  cefTRIAXone (ROCEPHIN) 2 g in sodium chloride 0.9 % 100 mL IVPB (0 g Intravenous Stopped 05/30/21 2125)  azithromycin (ZITHROMAX) 500  mg in sodium chloride 0.9 % 250 mL IVPB (500 mg Intravenous New Bag/Given 05/30/21 2231)  metroNIDAZOLE (FLAGYL) IVPB 500 mg (has no administration in time range)  atorvastatin (LIPITOR) tablet 40 mg (has no administration in time range)  traZODone (DESYREL) tablet 50 mg (has no administration in time range)  levothyroxine (SYNTHROID) tablet 25 mcg (has no administration in time range)  gabapentin (NEURONTIN) capsule 300 mg (has no administration in time range)  montelukast (SINGULAIR) tablet 10 mg (has no administration in time range)  vitamin B-12 (CYANOCOBALAMIN) tablet 1,000 mcg (has no administration in time range)  citalopram (CELEXA) tablet 20 mg (has no administration in time range)  acetaminophen (TYLENOL) tablet 650 mg (has no administration in time range)    Or  acetaminophen (TYLENOL) suppository 650 mg (has no administration in time range)  ondansetron (ZOFRAN) tablet 4 mg (has  no administration in time range)    Or  ondansetron (ZOFRAN) injection 4 mg (has no administration in time range)  enoxaparin (LOVENOX) injection 40 mg (has no administration in time range)  lactated ringers bolus 1,000 mL (1,000 mLs Intravenous New Bag/Given 05/30/21 2222)    And  lactated ringers bolus 1,000 mL (1,000 mLs Intravenous New Bag/Given 05/30/21 2231)     IMPRESSION / MDM / ASSESSMENT AND PLAN / ED COURSE  I reviewed the triage vital signs and the nursing notes.                              Differential diagnosis includes, but is not limited to, arrhythmia, ACS, sepsis, orthostatic syncope, vasovagal syncope, pneumonia, urinary tract infection  The patient is on the cardiac monitor to evaluate for evidence of arrhythmia and/or significant heart rate changes.  Presents with hypotension, syncope, and history concerning for possible aspiration.  DDx: PE, COPD exacerbation, Pneumothorax, TB, Atypical ACS, Esophageal Rupture, Toxic Exposure, Foreign Body Airway Obstruction.  Workup: CXR CBC, CMP, lactate, troponin  Given History, Exam, and Workup presentation most consistent with pneumonia.  Findings: WBC 24.8 Hemoglobin 9.4 Hematocrit 30.4 Calcium 8.4 Abdomen 3.4  Chest x-ray showing right upper lobe patchy opacity consistent with pneumonia  Tx: Ceftriaxone 1g IV Azithromycin 500mg  IV  2158 Reassessment: As patient is showing signs of early sepsis, patient will require admission to the internal medicine service for further evaluation and management  Disposition: Admit       FINAL CLINICAL IMPRESSION(S) / ED DIAGNOSES   Final diagnoses:  Pneumonia of right upper lobe due to infectious organism     Rx / DC Orders   ED Discharge Orders     None        Note:  This document was prepared using Dragon voice recognition software and may include unintentional dictation errors.   Naaman Plummer, MD 05/30/21 [phone]

## 2021-05-30 NOTE — ED Triage Notes (Signed)
First Nurse Note:  Arrives via EMS for ED evaluation for a syncopal event that occurred at 1030.  Lives at home.  VS wnl.

## 2021-05-30 NOTE — ED Triage Notes (Signed)
Patient to ED via [facility] from after a syncopal episode in her garage. Patient indorses pain in her head, back, and left leg. Not taking blood thinners but has a low hemoglobin (unsure of what number.) Had iron infusion on Monday for same. Patient alert and oriented in triage.

## 2021-05-30 NOTE — ED Provider Triage Note (Signed)
Emergency Medicine Provider Triage Evaluation Note  Brittany [name] , a 71 y.o. female  was evaluated in triage.  Pt complains of headache, neck pain and upper back pain after patient had a syncopal episode in her garage.  She is complaining of left foot, ankle and knee pain as well as headache.  Patient unaware of fever.  No chest pain, chest tightness or abdominal pain. Review of Systems  Positive: Patient has fever, headache, neck pain, foot pain, knee pain, ankle pain.  Negative: No chest pain or abdominal pain.   Physical Exam  BP (!) 107/58 (BP Location: Right Arm)    Pulse (!) 103    Temp (!) 100.4 F (38 C) (Oral)    Resp 18    Ht 5' (1.524 m)    Wt 62.6 kg    SpO2 95%    BMI 26.95 kg/m  Gen:   Awake, no distress   Resp:  Normal effort  MSK:   Moves extremities without difficulty  Other:    Medical Decision Making  Medically screening exam initiated at 5:41 PM.  Appropriate orders placed.  Brittany [name] was informed that the remainder of the evaluation will be completed by another provider, this initial triage assessment does not replace that evaluation, and the importance of remaining in the ED until their evaluation is complete.     Vallarie Mare [place], [place] 05/30/21 1743

## 2021-05-30 NOTE — Telephone Encounter (Signed)
Called patient and went over her steps on doing the stool test for occult blood.   Patient had put specimen in the [employer] x box.   Advised patient that I would place new kit in the mail.   Advised patient to place specimen in mailbox. Patient verbalizes understanding.

## 2021-05-30 NOTE — Telephone Encounter (Signed)
Pt called stating she mailed out her fit kit around 04/25/2021 and has not heard anything. Pt wants to know if her results are back and if not, does she need to do another one. Please advise.

## 2021-05-31 DIAGNOSIS — R197 Diarrhea, unspecified: Secondary | ICD-10-CM | POA: Diagnosis not present

## 2021-05-31 DIAGNOSIS — F39 Unspecified mood [affective] disorder: Secondary | ICD-10-CM | POA: Diagnosis present

## 2021-05-31 DIAGNOSIS — J690 Pneumonitis due to inhalation of food and vomit: Secondary | ICD-10-CM | POA: Diagnosis present

## 2021-05-31 DIAGNOSIS — K449 Diaphragmatic hernia without obstruction or gangrene: Secondary | ICD-10-CM

## 2021-05-31 DIAGNOSIS — E039 Hypothyroidism, unspecified: Secondary | ICD-10-CM | POA: Diagnosis present

## 2021-05-31 DIAGNOSIS — E860 Dehydration: Secondary | ICD-10-CM

## 2021-05-31 LAB — URINALYSIS, ROUTINE W REFLEX MICROSCOPIC
Bilirubin Urine: NEGATIVE
Glucose, UA: NEGATIVE mg/dL
Ketones, ur: NEGATIVE mg/dL
Nitrite: NEGATIVE
Protein, ur: NEGATIVE mg/dL
Specific Gravity, Urine: <1.005 — ABNORMAL LOW (ref 1.005–1.030)
pH: 6.5 (ref 5.0–8.0)

## 2021-05-31 LAB — BASIC METABOLIC PANEL
Anion gap: 6 (ref 5–15)
BUN: 12 mg/dL (ref 8–23)
CO2: 25 mmol/L (ref 22–32)
Calcium: 8.1 mg/dL — ABNORMAL LOW (ref 8.9–10.3)
Chloride: 110 mmol/L (ref 98–111)
Creatinine, Ser: 0.92 mg/dL (ref 0.44–1.00)
GFR, Estimated: >60 mL/min (ref >60)
Glucose, Bld: 97 mg/dL (ref 70–99)
Potassium: 3.4 mmol/L — ABNORMAL LOW (ref 3.5–5.1)
Sodium: 141 mmol/L (ref 135–145)

## 2021-05-31 LAB — CBC
HCT: 26.7 % — ABNORMAL LOW (ref 36.0–46.0)
Hemoglobin: 8.4 g/dL — ABNORMAL LOW (ref 12.0–15.0)
MCH: 28.6 pg (ref 26.0–34.0)
MCHC: 31.5 g/dL (ref 30.0–36.0)
MCV: 90.8 fL (ref 80.0–100.0)
Platelets: 226 10*3/uL (ref 150–400)
RBC: 2.94 MIL/uL — ABNORMAL LOW (ref 3.87–5.11)
RDW: 15.9 % — ABNORMAL HIGH (ref 11.5–15.5)
WBC: 15.4 10*3/uL — ABNORMAL HIGH (ref 4.0–10.5)
nRBC: 0 % (ref 0.0–0.2)

## 2021-05-31 LAB — C DIFFICILE QUICK SCREEN W PCR REFLEX
C Diff antigen: NEGATIVE
C Diff interpretation: NOT DETECTED
C Diff toxin: NEGATIVE

## 2021-05-31 LAB — RESP PANEL BY RT-PCR (FLU A&B, COVID) ARPGX2
Influenza A by PCR: NEGATIVE
Influenza B by PCR: NEGATIVE
SARS Coronavirus 2 by RT PCR: NEGATIVE

## 2021-05-31 LAB — PROCALCITONIN: Procalcitonin: 0.81 ng/mL

## 2021-05-31 LAB — HIV ANTIBODY (ROUTINE TESTING W REFLEX): HIV Screen 4th Generation wRfx: NONREACTIVE

## 2021-05-31 MED ORDER — POTASSIUM CHLORIDE CRYS ER 20 MEQ PO TBCR: 40.0000 meq | EXTENDED_RELEASE_TABLET | Freq: Once | ORAL | Status: DC

## 2021-05-31 MED ORDER — POTASSIUM CHLORIDE 20 MEQ PO PACK: 40.0000 meq | PACK | Freq: Once | ORAL | Status: DC

## 2021-05-31 MED ORDER — CEFDINIR 300 MG PO CAPS: 300.0000 mg | ORAL_CAPSULE | Freq: Two times a day (BID) | ORAL | 0 refills | Status: DC

## 2021-05-31 MED ORDER — LOPERAMIDE HCL 2 MG PO CAPS
2.0000 mg | ORAL_CAPSULE | ORAL | Status: DC | PRN
  Administered 2021-05-31: 2 mg via ORAL
  Filled 2021-05-31: qty 1

## 2021-05-31 MED ORDER — PANTOPRAZOLE SODIUM 20 MG PO TBEC: 20.0000 mg | DELAYED_RELEASE_TABLET | Freq: Every day | ORAL | 0 refills | Status: DC

## 2021-05-31 MED ORDER — AZITHROMYCIN 250 MG PO TABS: 250.0000 mg | ORAL_TABLET | Freq: Every day | ORAL | 0 refills | Status: DC

## 2021-05-31 NOTE — Assessment & Plan Note (Signed)
Septic shock ruled out.

## 2021-05-31 NOTE — Discharge Summary (Signed)
Physician Discharge Summary   Patient: Brittany [name] MRN: [id number] DOB: [birth year]  Admit date:     05/30/2021  Discharge date: 05/31/21  Discharge Physician: Edwin Dada   PCP: Derinda Late, MD   Recommendations at discharge:   Follow up with PCP Dr. Baldemar Lenis in 1 week Dr. Baldemar Lenis: Please check BMP in 1 week Dr. Baldemar Lenis: Please refer back to General Surgery or coordinate with GI regarding hiatal hernia (see below)      Discharge Diagnoses Principal Problem:   Aspiration pneumonia of right upper lobe due to gastric secretions ([place]) Active Problems:   Hiatal hernia with GERD   Iron deficiency anemia due to chronic blood loss   Hyperlipidemia   Diarrhea   Hypothyroidism, acquired   Mood disorder [facility])   Dehydration     Hospital Course   Mrs. Penado is a 71 y.o. F with depression, hypothyroidism and hiatal hernia who presented with passing out, weakness.  Evidently has been having diarrhea for 2 months, working up as outpatient, colonoscopy pending.  Then morning before admission, woke with vomitus in her mouth, not uncommon for her.  Noticed cough, then just PTA, she was bringing in the garbage when she passed out.  In the ER, HR 103, BP soft, febrile, WBC 24, lactate >2, and CXR showed RUL opacity.  Started on antibiotics and admitted.       * Aspiration pneumonia of right upper lobe due to gastric secretions ([place])- (present on admission) Patient improved rapidly overnight.  BP low, tachycardic due to dehydration, sepsis and septic shock ruled out.  Treated with Rocephin and azithromycin and improved.  Patient now mentating at baseline, taking orals.  Temp < 100 F, heart rate < 100bpm, RR < 24, ambulated on room air and SpO2 at baseline.      Discharged with 5 days cefdinir and azithromycin and close PCP follow up.  Hiatal hernia with GERD Patient with prominent reflux symptoms, and a "vomiting spell" preceding this pneumonia.  Had a Nissen in 2014,  but reports more recently her GI told her this had "loosened up" and now she is having frequent and recurrent symptoms, despite sleeping elevated, small meals, and avoiding caffeine, alcohol, etc.  I have added a PPI, but the problem sounds mechanical. Given she now has a complication from her reflux, I have recommended she return for evaluation of other treamtent options, either with her GI or back with her surgeon.  Dehydration- (present on admission) Due to illness, diarrhea.  Given fluids overnight and felt well.  Had mild orthostatic BP but was asymptomatic and taking PO well.  Recommended pushing fluids at home.   Mood disorder ([place])- (present on admission)    Hypothyroidism, acquired- (present on admission)    Diarrhea- (present on admission) Cdiff negative.  Recommend loperamide and push fluids.  Hyperlipidemia- (present on admission)    Iron deficiency anemia due to chronic blood loss- (present on admission)          Disposition: Home Diet recommendation: Regular diet  DISCHARGE MEDICATION: Allergies as of 05/31/2021       Reactions   Lithium Anaphylaxis   Fentanyl Other (See Comments)   Extreme Sedation Other reaction(s): Lethargy (intolerance) Extreme sedation   Morphine Nausea And Vomiting   Pseudoephedrine Hypertension        Medication List     TAKE these medications    atorvastatin 40 MG tablet Commonly known as: LIPITOR Take 40 mg by mouth daily.   azithromycin 250 MG  tablet Commonly known as: Zithromax Z-Pak Take 1 tablet (250 mg total) by mouth daily.   cefdinir 300 MG capsule Commonly known as: OMNICEF Take 1 capsule (300 mg total) by mouth 2 (two) times daily.   Cholecalciferol 50 MCG (2000 UT) Tabs Take 2,000 Units by mouth daily.   citalopram 20 MG tablet Commonly known as: CELEXA Take 20 mg by mouth daily.   clorazepate 3.75 MG tablet Commonly known as: TRANXENE Take 7.5 mg by mouth See admin instructions. Take 1 tablet  (3.75mg ) by mouth twice daily - may take 1 additional tablet (3.75mg ) by mouth at midday if needed   gabapentin 300 MG capsule Commonly known as: Neurontin Take 1 capsule (300 mg total) by mouth at bedtime.   levothyroxine 25 MCG tablet Commonly known as: SYNTHROID Take 25 mcg by mouth every morning.   montelukast 10 MG tablet Commonly known as: SINGULAIR Take 10 mg by mouth at bedtime.   pantoprazole 20 MG tablet Commonly known as: Protonix Take 1 tablet (20 mg total) by mouth daily.   traZODone 50 MG tablet Commonly known as: DESYREL Take 50 mg by mouth at bedtime.   vitamin B-12 1000 MCG tablet Commonly known as: CYANOCOBALAMIN Take 1,000 mcg by mouth daily.               Durable Medical Equipment  (From admission, onward)           Start     Ordered   05/31/21 1159  DME Walker  Once       Question Answer Comment  Walker: With 5 Inch Wheels   Patient needs a walker to treat with the following condition Aspiration pneumonia ([place])      05/31/21 1209            Follow-up Information     Derinda Late, MD. Schedule an appointment as soon as possible for a visit in 1 week(s).   Specialty: Family Medicine Contact information: [address] [facility] [place] [address] [phone]                Discharge Instructions     Discharge instructions   Complete by: As directed    From Dr. Loleta Books: You were admitted for passing out, and here we found that this was from pneumonia. Initially, we were concerned you had "sepsis", which is a severe form of infection, but in retrospect, likely what was happening was dehydration (partly driven by your diarrhea).  Push fluids for the next few days. Make sure you are drinking at LEAST 64 oz fluids per day.  Take the two antibiotics: azithromycin and cefdinir Take azithromycin 250 mg nightly for 4 more days Take cefdinir 300 mg twice daily starting  tonight, for four more days  Use the walker when ambulating. For pain in the knee, you may take acetaminophen/Tylenol 1000 mg (two tabs)  up to three times daily If your knee is still hurting a lot when you see Dr. Baldemar Lenis later this week, ask him about therapy.   Resume your other medicines. Be careful with clorazepate and gabapentin because they can increase the chances of weakness or falls. For now, hold off on the gabapentin if you can, don't take it for a week And take as little of the chlorazepate as you can for the next week (don't stop cold [place], but don't take any extra doses)   For your reflux:  I will send this discharge summary  to Dr. Baldemar Lenis and Dr. Virgina Jock It's important that they follow up on this.  You are doing everything you can to reduce it, and the reflux is still affecting you frequently (and in this case, even caused a pneumonia  One thing you can do to add, is try starting pantoprazole 20 mg nightly before dinner This will reduce stomach acid, which may help a little   Increase activity slowly   Complete by: As directed         Discharge Exam: Filed Weights   05/30/21 1730 05/30/21 1732  Weight: 62.5 kg 62.6 kg   General: Pt is alert, awake, not in acute distress Cardiovascular: RRR, nl S1-S2, no murmurs appreciated.   No LE edema.   Respiratory: Normal respiratory rate and rhythm.  CTAB without rales or wheezes. Abdominal: Abdomen soft and non-tender.  No distension or HSM.   Neuro/Psych: Strength symmetric in upper and lower extremities.  Judgment and insight appear normal.   Condition at discharge: good  The results of significant diagnostics from this hospitalization (including imaging, microbiology, ancillary and laboratory) are listed below for reference.   Imaging Studies: DG Chest 2 View  Result Date: 05/30/2021 CLINICAL DATA:  Syncope, fall EXAM: CHEST - 2 VIEW COMPARISON:  Chest radiographs done on 06/26/2014 FINDINGS: Cardiac size is  within normal limits. There are no signs of pulmonary edema. There is patchy infiltrate in the right upper lobe which was not seen in the previous study done on 06/26/2014. there is previous surgical fusion in the lower cervical spine. IMPRESSION: New patchy infiltrates are seen in the right upper lung fields, possibly suggesting pneumonia. Part of this finding may suggest underlying scarring. There are no signs of alveolar pulmonary edema. There is no pleural effusion or pneumothorax. Electronically Signed   By: Elmer Picker M.D.   On: 05/30/2021 18:16   DG Ankle Complete Left  Result Date: 05/30/2021 CLINICAL DATA:  Trauma, fall EXAM: LEFT ANKLE COMPLETE - 3+ VIEW COMPARISON:  None. FINDINGS: No recent fracture or dislocation is seen. There is 2 mm faint smooth marginated calcification adjacent to the base of fifth metatarsal which may be residual from previous injury. There are scattered arterial calcifications in the soft tissues. IMPRESSION: No recent fracture or dislocation is seen. There is 2 mm smooth marginated calcification adjacent to the base of left fifth metatarsal, possibly suggesting old avulsion. Electronically Signed   By: Elmer Picker M.D.   On: 05/30/2021 18:19   CT Head Wo Contrast  Result Date: 05/30/2021 CLINICAL DATA:  Syncopal episode. EXAM: CT HEAD WITHOUT CONTRAST TECHNIQUE: Contiguous axial images were obtained from the base of the skull through the vertex without intravenous contrast. RADIATION DOSE REDUCTION: This exam was performed according to the departmental dose-optimization program which includes automated exposure control, adjustment of the mA and/or kV according to patient size and/or use of iterative reconstruction technique. COMPARISON:  November 27, 2017 FINDINGS: Brain: There is mild cerebral atrophy with widening of the extra-axial spaces and ventricular dilatation. There are areas of decreased attenuation within the white matter tracts of the  supratentorial brain, consistent with microvascular disease changes. Vascular: No hyperdense vessel or unexpected calcification. Skull: Normal. Negative for fracture or focal lesion. Sinuses/Orbits: No acute finding. Other: None. IMPRESSION: 1. Generalized cerebral atrophy. 2. No acute intracranial abnormality. Electronically Signed   By: Virgina Norfolk M.D.   On: 05/30/2021 19:16   CT Cervical Spine Wo Contrast  Result Date: 05/30/2021 CLINICAL DATA:  Syncopal episode. EXAM: CT CERVICAL  SPINE WITHOUT CONTRAST TECHNIQUE: Multidetector CT imaging of the cervical spine was performed without intravenous contrast. Multiplanar CT image reconstructions were also generated. RADIATION DOSE REDUCTION: This exam was performed according to the departmental dose-optimization program which includes automated exposure control, adjustment of the mA and/or kV according to patient size and/or use of iterative reconstruction technique. COMPARISON:  None. FINDINGS: Alignment: Normal. Skull base and vertebrae: No acute fracture. A metallic density fusion plate and screws are seen along the anterior aspects of the C5 and C6 vertebral bodies with additional operative material noted within the C5-C6 intervertebral disc space. No primary bone lesion or focal pathologic process. Soft tissues and spinal canal: No prevertebral fluid or swelling. No visible canal hematoma. Disc levels: Mild endplate sclerosis is seen at the levels of C2-C3, C3-C4 and C4-C5. There is marked severity narrowing of the anterior atlantoaxial articulation. Mild multilevel intervertebral disc space narrowing is seen throughout the cervical spine with postoperative changes noted at the level of C5-C6. Moderate severity multilevel facet joint hypertrophy is noted. Bilateral, this is most prominent at the levels of C3-C4 and C4-C5. Upper chest: Mild biapical scarring and/or atelectasis is seen. Other: None. IMPRESSION: 1. No acute fracture or subluxation in the  cervical spine. 2. Postoperative changes at the level of C5-C6. 3. Multilevel degenerative changes throughout the cervical spine, as described above. Electronically Signed   By: Virgina Norfolk M.D.   On: 05/30/2021 19:20   CT Thoracic Spine Wo Contrast  Result Date: 05/30/2021 CLINICAL DATA:  Headache with neck and upper back pain EXAM: CT THORACIC SPINE WITHOUT CONTRAST TECHNIQUE: Multidetector CT images of the thoracic were obtained using the standard protocol without intravenous contrast. RADIATION DOSE REDUCTION: This exam was performed according to the departmental dose-optimization program which includes automated exposure control, adjustment of the mA and/or kV according to patient size and/or use of iterative reconstruction technique. COMPARISON:  None. FINDINGS: Alignment: Normal. Vertebrae: No acute fracture or focal pathologic process. Paraspinal and other soft tissues: Calcific aortic atherosclerosis. Reticular opacities in the right upper lobe. Disc levels: No spinal canal stenosis. IMPRESSION: 1. No acute fracture or static subluxation of the thoracic spine. 2. Reticular opacities in the right upper lobe, concerning for infection Aortic Atherosclerosis (ICD10-I70.0). Electronically Signed   By: Ulyses Jarred M.D.   On: 05/30/2021 19:33   DG Knee Complete 4 Views Left  Result Date: 05/30/2021 CLINICAL DATA:  Eight no thick rod-like 2 minutes left gynecology soon Legrand [place] but EXAM: LEFT KNEE - COMPLETE 4+ VIEW COMPARISON:  None. FINDINGS: Minimal peripheral medial compartment degenerative spurring. Mild-to-moderate patellofemoral joint space narrowing with mild superior and inferior patellar degenerative osteophytosis. Very small joint effusion. There are well corticated ossicles measuring approximately 7 mm and 6 mm, respectively overlying the posterior knee joint on lateral view, possibly small loose bodies. No definite acute fracture is seen. No dislocation. IMPRESSION: No acute fracture is  seen. 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 Electronically Signed   By: Yvonne Kendall M.D.   On: 05/30/2021 18:14   DG Foot Complete Left  Result Date: 05/30/2021 CLINICAL DATA:  Trauma, fall EXAM: LEFT FOOT - COMPLETE 3+ VIEW COMPARISON:  None. FINDINGS: No recent fracture or dislocation is seen. Degenerative changes are noted with bony spurs in first metatarsophalangeal joint. Arterial calcifications are seen in the soft tissues. IMPRESSION: No recent fracture or dislocation is seen in the left foot. Degenerative changes are noted in first metatarsophalangeal joint. Electronically Signed   By: Elmer Picker M.D.   On: 05/30/2021 18:17  Microbiology: Results for orders placed or performed during the hospital encounter of 05/30/21  Blood culture (routine x 2)     Status: None (Preliminary result)   Collection Time: 05/30/21  8:57 PM   Specimen: BLOOD  Result Value Ref Range Status   Specimen Description BLOOD RIGHT ANTECUBITAL  Final   Special Requests   Final    BOTTLES DRAWN AEROBIC AND ANAEROBIC Blood Culture results may not be optimal due to an inadequate volume of blood received in culture bottles   Culture   Final    NO GROWTH < 12 HOURS Performed at [facility], [address] [place], [place] [address]    Report Status PENDING  Incomplete  Blood culture (routine x 2)     Status: None (Preliminary result)   Collection Time: 05/30/21  8:57 PM   Specimen: BLOOD  Result Value Ref Range Status   Specimen Description BLOOD LEFT ANTECUBITAL  Final   Special Requests   Final    BOTTLES DRAWN AEROBIC AND ANAEROBIC Blood Culture adequate volume   Culture   Final    NO GROWTH < 12 HOURS Performed at [facility], [address] [place], [place] [address]    Report Status PENDING  Incomplete  Resp Panel by RT-PCR (Flu A&B, Covid) Nasopharyngeal Swab     Status: None   Collection Time: 05/31/21  2:05 AM   Specimen: Nasopharyngeal Swab;  Nasopharyngeal(NP) swabs in vial transport medium  Result Value Ref Range Status   SARS Coronavirus 2 by RT PCR NEGATIVE NEGATIVE Final    Comment: (NOTE) SARS-CoV-2 target nucleic acids are NOT DETECTED.  The SARS-CoV-2 RNA is generally detectable in upper respiratory specimens during the acute phase of infection. The lowest concentration of SARS-CoV-2 viral copies this assay can detect is 138 copies/mL. A negative result does not preclude SARS-Cov-2 infection and should not be used as the sole basis for treatment or other patient management decisions. A negative result may occur with  improper specimen collection/handling, submission of specimen other than nasopharyngeal swab, presence of viral mutation(s) within the areas targeted by this assay, and inadequate number of viral copies(<138 copies/mL). A negative result must be combined with clinical observations, patient history, and epidemiological information. The expected result is Negative.  Fact Sheet for Patients:  EntrepreneurPulse.com.au  Fact Sheet for Healthcare Providers:  IncredibleEmployment.be  This test is no t yet approved or cleared by the [place] FDA and  has been authorized for detection and/or diagnosis of SARS-CoV-2 by FDA under an Emergency Use Authorization (EUA). This EUA will remain  in effect (meaning this test can be used) for the duration of the COVID-19 declaration under Section 564(b)(1) of the Act, 21 U.S.C.section 360bbb-3(b)(1), unless the authorization is terminated  or revoked sooner.       Influenza A by PCR NEGATIVE NEGATIVE Final   Influenza B by PCR NEGATIVE NEGATIVE Final    Comment: (NOTE) The Xpert Xpress SARS-CoV-2/FLU/RSV plus assay is intended as an aid in the diagnosis of influenza from Nasopharyngeal swab specimens and should not be used as a sole basis for treatment. Nasal washings and aspirates are unacceptable for Xpert Xpress  SARS-CoV-2/FLU/RSV testing.  Fact Sheet for Patients: EntrepreneurPulse.com.au  Fact Sheet for Healthcare Providers: IncredibleEmployment.be  This test is not yet approved or cleared by the [place] FDA and has been authorized for detection and/or diagnosis of SARS-CoV-2 by FDA under an Emergency Use Authorization (EUA). This EUA will remain in effect (meaning this test  can be used) for the duration of the COVID-19 declaration under Section 564(b)(1) of the Act, 21 U.S.C. section 360bbb-3(b)(1), unless the authorization is terminated or revoked.  Performed at [facility], [place], [place] [address]   C Difficile Quick Screen w PCR reflex     Status: None   Collection Time: 05/31/21  7:45 AM   Specimen: Stool  Result Value Ref Range Status   C Diff antigen NEGATIVE NEGATIVE Final   C Diff toxin NEGATIVE NEGATIVE Final   C Diff interpretation No C. difficile detected.  Final    Comment: Performed at [facility], [address] [place], [place] [address]    Labs: CBC: Recent Labs  Lab 05/30/21 1731 05/31/21 0521  WBC 24.8* 15.4*  HGB 9.4* 8.4*  HCT 30.4* 26.7*  MCV 90.2 90.8  PLT 271 656   Basic Metabolic Panel: Recent Labs  Lab 05/30/21 1739 05/31/21 0521  NA 135 141  K 4.0 3.4*  CL 102 110  CO2 22 25  GLUCOSE 94 97  BUN 11 12  CREATININE 0.98 0.92  CALCIUM 8.4* 8.1*   Liver Function Tests: Recent Labs  Lab 05/30/21 1739  AST 33  ALT 15  ALKPHOS 74  BILITOT 1.1  PROT 6.9  ALBUMIN 3.4*   CBG: No results for input(s): GLUCAP in the last 168 hours.  Discharge time spent: 40 minutes.  Signed: Edwin Dada, MD [facility] 05/31/2021

## 2021-05-31 NOTE — Assessment & Plan Note (Signed)
Cdiff negative.  Recommend loperamide and push fluids.

## 2021-05-31 NOTE — Hospital Course (Signed)
Brittany [name] is a 71 y.o. F with depression, hypothyroidism and hiatal hernia who presented with passing out, weakness.  Evidently has been having diarrhea for 2 months, working up as outpatient, colonoscopy pending.  Then morning before admission, woke with vomitus in her mouth, not uncommon for her.  Noticed cough, then just PTA, she was bringing in the garbage when she passed out.  In the ER, HR 103, BP soft, febrile, WBC 24, lactate >2, and CXR showed RUL opacity.  Started on antibiotics and admitted.

## 2021-05-31 NOTE — ED Notes (Signed)
Pt placed on purewick at this time. Warm blankets given

## 2021-05-31 NOTE — ED Notes (Signed)
Patient ambulatory in room with walker and spo2 monitoring. Spo2 maintained at 95-98% on room air.

## 2021-05-31 NOTE — ED Notes (Signed)
Pt assisted to bedside commode. NAD noted. New purewick placed

## 2021-05-31 NOTE — Assessment & Plan Note (Signed)
Patient with prominent reflux symptoms, and a "vomiting spell" preceding this pneumonia.  Had a Nissen in 2014, but reports more recently her GI told her this had "loosened up" and now she is having frequent and recurrent symptoms, despite sleeping elevated, small meals, and avoiding caffeine, alcohol, etc.  I have added a PPI, but the problem sounds mechanical. Given she now has a complication from her reflux, I have recommended she return for evaluation of other treamtent options, either with her GI or back with her surgeon.

## 2021-05-31 NOTE — Assessment & Plan Note (Signed)
Patient improved rapidly overnight.  BP low, tachycardic due to dehydration, sepsis and septic shock ruled out.  Treated with Rocephin and azithromycin and improved.  Patient now mentating at baseline, taking orals.  Temp < 100 F, heart rate < 100bpm, RR < 24, ambulated on room air and SpO2 at baseline.      Discharged with 5 days cefdinir and azithromycin and close PCP follow up.

## 2021-05-31 NOTE — Assessment & Plan Note (Signed)
Due to illness, diarrhea.  Given fluids overnight and felt well.  Had mild orthostatic BP but was asymptomatic and taking PO well.  Recommended pushing fluids at home.

## 2021-05-31 NOTE — ED Notes (Signed)
Pt sleeping at this time. NAD noted, respirations even and unlabored. °

## 2021-05-31 NOTE — Progress Notes (Signed)
PT Cancellation Note  Patient Details Name: Brittany [name] MRN: [id number] DOB: 1950/11/27   Cancelled Treatment:    Reason Eval/Treat Not Completed: PT screened, no needs identified, will sign off PT orders received, chart reviewed. Per chart pt is ambulatory with RW. Spoke with nurse who reports pt is ambulatory & does not demonstrate acute PT needs. PT to sign off, please re-consult if new needs arise.  Lavone Nian, PT, DPT 05/31/21, 1:29 PM   Waunita Schooner 05/31/2021, 1:28 PM

## 2021-06-03 ENCOUNTER — Encounter: Payer: Self-pay | Admitting: Neurology

## 2021-06-04 LAB — CULTURE, BLOOD (ROUTINE X 2)
Culture: NO GROWTH
Culture: NO GROWTH
Special Requests: ADEQUATE

## 2021-06-08 ENCOUNTER — Ambulatory Visit
Admission: RE | Admit: 2021-06-08 | Discharge: 2021-06-08 | Disposition: A | Discharge status: Home / Self Care | Payer: Medicare HMO | Source: Ambulatory Visit | Attending: Neurology | Admitting: Neurology

## 2021-06-08 ENCOUNTER — Other Ambulatory Visit: Payer: Self-pay

## 2021-06-08 DIAGNOSIS — D329 Benign neoplasm of meninges, unspecified: Secondary | ICD-10-CM

## 2021-06-08 MED ORDER — GADOBENATE DIMEGLUMINE 529 MG/ML IV SOLN
12.0000 mL | Freq: Once | INTRAVENOUS | Status: AC | PRN
  Administered 2021-06-08: 12 mL via INTRAVENOUS

## 2021-06-09 ENCOUNTER — Encounter: Payer: Self-pay | Admitting: Neurology

## 2021-06-09 ENCOUNTER — Encounter: Payer: Self-pay | Admitting: Oncology

## 2021-06-09 ENCOUNTER — Ambulatory Visit: Payer: Medicare HMO | Admitting: Neurology

## 2021-06-09 NOTE — Telephone Encounter (Signed)
FYI

## 2021-06-12 ENCOUNTER — Encounter: Payer: Self-pay | Admitting: Neurology

## 2021-06-16 ENCOUNTER — Encounter: Payer: Self-pay | Admitting: Oncology

## 2021-06-17 NOTE — Telephone Encounter (Signed)
Please advise 

## 2021-06-18 ENCOUNTER — Ambulatory Visit: Attending: Physician Assistant | Primary: Family Medicine

## 2021-06-18 LAB — OCCULT BLOOD DIAGNOSTIC: OCCULT BLOOD, FECAL, IA: NEGATIVE

## 2021-06-18 LAB — OCCULT BLOOD IMMUNOASSAY,DIAGNOSTIC: Occult blood fecal, by IA: NEGATIVE

## 2021-06-19 NOTE — Telephone Encounter (Signed)
Dr. Tasia Catchings: Samul Dada, are you able to assist with finding out cost of venofer infusion for this patient?

## 2021-06-20 NOTE — Telephone Encounter (Signed)
Please set up patient to set up 2 iron infusions (wkly or per pt pref).

## 2021-06-26 ENCOUNTER — Other Ambulatory Visit: Payer: Self-pay

## 2021-06-26 ENCOUNTER — Ambulatory Visit: Payer: Medicare HMO | Admitting: Neurology

## 2021-06-26 DIAGNOSIS — R202 Paresthesia of skin: Secondary | ICD-10-CM | POA: Diagnosis not present

## 2021-06-26 NOTE — Procedures (Signed)
[facility]  [place], [place]  [place], [place] [address] Tel: [phone] Fax:  [phone] Test Date:  06/26/2021  Patient: Brittany [name] DOB: [birth year] Physician: Narda Amber, DO  Sex: Female Height: 5\' 5"  Ref Phys: Narda Amber, DO  ID#: [id number]   Technician:    Patient Complaints: This is a 71 year old female referred for evaluation of generalized paresthesias.  NCV & EMG Findings: Extensive electrodiagnostic testing of the left upper and lower extremity shows: Left median, ulnar, mixed palmar, sural, and superficial peroneal sensory responses are within normal limits. Left median, ulnar, peroneal, and tibial motor responses are within normal limits. Left tibial H reflex study is within normal limits. There is no evidence of active or chronic motor axonal loss changes affecting any of the tested muscles.  Motor unit configuration and recruitment pattern is within normal limits.  Impression: This is a normal study of the left upper and lower extremities.  In particular, there is no evidence of a large fiber sensorimotor polyneuropathy, cervical/lumbosacral radiculopathy, carpal tunnel syndrome.   ___________________________ Narda Amber, DO    Nerve Conduction Studies Anti Sensory Summary Table   Stim Site NR Peak (ms) Norm Peak (ms) P-T Amp (V) Norm P-T Amp  Left Median Anti Sensory (2nd Digit)  36C  Wrist    3.4 <3.8 12.4 >10  Left Sup Peroneal Anti Sensory (Ant Lat Mall)  36C  Site 2    2.9  7.7   Left Sural Anti Sensory (Lat Mall)  36C  Calf    2.8 <4.6 6.7 >3  Left Ulnar Anti Sensory (5th Digit)  36C  Wrist    3.1 <3.2 9.1 >5   Motor Summary Table   Stim Site NR Onset (ms) Norm Onset (ms) O-P Amp (mV) Norm O-P Amp Site1 Site2 Delta-0 (ms) Dist (cm) Vel (m/s) Norm Vel (m/s)  Left Median Motor (Abd Poll Brev)  36C  Wrist    3.2 <4.0 10.4 >5 Elbow Wrist 5.2 29.0 56 >50  Elbow    8.4  9.4         Left Peroneal Motor (Ext Dig Brev)   36C  Ankle    3.6 <6.0 3.8 >2.5 B Fib Ankle 9.8 39.0 40 >40  B Fib    13.4  3.2  Poplt B Fib 1.8 8.0 44 >40  Poplt    15.2  3.1         Left Tibial Motor (Abd Hall Brev)  36C  Ankle    3.4 <6.0 6.1 >4 Knee Ankle 8.5 43.0 51 >40  Knee    11.9  4.1         Left Ulnar Motor (Abd Dig Minimi)  36C  Wrist    2.4 <3.1 7.6 >7 B Elbow Wrist 4.1 22.0 54 >50  B Elbow    6.5  6.5  A Elbow B Elbow 1.6 10.0 63 >50  A Elbow    8.1  6.0          Comparison Summary Table   Stim Site NR Peak (ms) Norm Peak (ms) P-T Amp (V) Site1 Site2 Delta-P (ms) Norm Delta (ms)  Left Median/Ulnar Palm Comparison (Wrist - 8cm)  36C  Median Palm    1.9 <2.2 26.9 Median Palm Ulnar Palm 0.2   Ulnar Palm    1.7 <2.2 10.2       H Reflex Studies   NR H-Lat (ms) Lat Norm (ms) L-R H-Lat (ms)  Left Tibial (Gastroc)  36C  34.56 <35    EMG   Side Muscle Ins Act Fibs Psw Fasc Number Recrt Dur Dur. Amp Amp. Poly Poly. Comment  Left AntTibialis Nml Nml Nml Nml Nml Nml Nml Nml Nml Nml Nml Nml N/A  Left Gastroc Nml Nml Nml Nml Nml Nml Nml Nml Nml Nml Nml Nml N/A  Left Flex Dig Long Nml Nml Nml Nml Nml Nml Nml Nml Nml Nml Nml Nml N/A  Left RectFemoris Nml Nml Nml Nml Nml Nml Nml Nml Nml Nml Nml Nml N/A  Left GluteusMed Nml Nml Nml Nml Nml Nml Nml Nml Nml Nml Nml Nml N/A  Left 1stDorInt Nml Nml Nml Nml Nml Nml Nml Nml Nml Nml Nml Nml N/A  Left PronatorTeres Nml Nml Nml Nml Nml Nml Nml Nml Nml Nml Nml Nml N/A  Left Biceps Nml Nml Nml Nml Nml Nml Nml Nml Nml Nml Nml Nml N/A  Left Triceps Nml Nml Nml Nml Nml Nml Nml Nml Nml Nml Nml Nml N/A  Left Deltoid Nml Nml Nml Nml Nml Nml Nml Nml Nml Nml Nml Nml N/A      Waveforms:

## 2021-06-30 ENCOUNTER — Other Ambulatory Visit: Payer: Self-pay

## 2021-06-30 ENCOUNTER — Inpatient Hospital Stay: Payer: Medicare HMO | Attending: Oncology

## 2021-06-30 ENCOUNTER — Ambulatory Visit: Payer: Medicare HMO | Admitting: Neurology

## 2021-06-30 VITALS — BP 112/63 | HR 52 | Temp 96.0°F | Resp 18

## 2021-06-30 DIAGNOSIS — R58 Hemorrhage, not elsewhere classified: Secondary | ICD-10-CM | POA: Diagnosis not present

## 2021-06-30 DIAGNOSIS — D500 Iron deficiency anemia secondary to blood loss (chronic): Secondary | ICD-10-CM | POA: Diagnosis not present

## 2021-06-30 MED ORDER — SODIUM CHLORIDE 0.9 % IV SOLN
Freq: Once | INTRAVENOUS | Status: AC
  Filled 2021-06-30: qty 250

## 2021-06-30 MED ORDER — IRON SUCROSE 20 MG/ML IV SOLN
200.0000 mg | Freq: Once | INTRAVENOUS | Status: AC
  Administered 2021-06-30: 200 mg via INTRAVENOUS
  Filled 2021-06-30: qty 10

## 2021-06-30 MED ORDER — SODIUM CHLORIDE 0.9 % IV SOLN: 200.0000 mg | Freq: Once | INTRAVENOUS | Status: DC

## 2021-06-30 NOTE — Patient Instructions (Signed)

## 2021-07-02 ENCOUNTER — Telehealth: Payer: Self-pay | Admitting: Neurology

## 2021-07-02 NOTE — Telephone Encounter (Signed)
Pt called in and left a message yesterday after we closed. She stated she was returning a call from [facility]

## 2021-07-02 NOTE — Telephone Encounter (Signed)
Called patient and informed her that her nerve testing was normal, specifically no evidence of neuropathy.  We can look into her symptoms further by doing a skin biopsy in the feet and legs which looks for small fiber neuropathy.  If she would like to proceed with this, we can refer to podiatry. Otherwise, management would be symptom control with gabapentin - she was started on gabapentin 300mg  at bedtime, which she can continue and if tolerating, even increase to 300mg  twice daily.     Patient declined the podiatry referral for skin biopsy. Also, Patient stated that she currently takes Gabapentin 300 mg at bedtime and does not want to increase her dose because it is causing her constipation. She would like to know if there is anything else she can take since the Gabapentin is causing constipation?   Patient is aware I will send the message back to Dr. Posey Pronto and give her a call once I hear back.

## 2021-07-03 NOTE — Telephone Encounter (Signed)
We can offer Cymbalta 30mg  - 1 tablet at bedtime. Side effects includes dizziness, but usually at higher dosages. Ok to send 30-day prescription, if she would like to try.

## 2021-07-04 NOTE — Telephone Encounter (Signed)
Called patient and informed her that we can offer Cymbalta 30mg  - 1 tablet at bedtime. Side effects includes dizziness, but usually at higher dosages.  Patient stated that she does not want to try that for fear it will make her pass out since she is dealing with low blood count at the moment. Patient stated she has an Endoscopy and colonoscopy scheduled in march to determine where she is loosing blood. Patient stated if she changes her mind about the medication she will contact us back.   Patient had no further questions or concerns.

## 2021-07-07 ENCOUNTER — Encounter

## 2021-07-07 ENCOUNTER — Inpatient Hospital Stay: Payer: Medicare HMO

## 2021-07-07 ENCOUNTER — Other Ambulatory Visit: Payer: Self-pay

## 2021-07-07 VITALS — BP 127/53 | HR 68 | Temp 97.3°F | Resp 18

## 2021-07-07 DIAGNOSIS — D500 Iron deficiency anemia secondary to blood loss (chronic): Secondary | ICD-10-CM

## 2021-07-07 MED ORDER — SODIUM CHLORIDE 0.9 % IV SOLN: 200.0000 mg | Freq: Once | INTRAVENOUS | Status: DC

## 2021-07-07 MED ORDER — IRON SUCROSE 20 MG/ML IV SOLN
200.0000 mg | Freq: Once | INTRAVENOUS | Status: AC
  Administered 2021-07-07: 200 mg via INTRAVENOUS
  Filled 2021-07-07: qty 10

## 2021-07-07 MED ORDER — SODIUM CHLORIDE 0.9 % IV SOLN
Freq: Once | INTRAVENOUS | Status: AC
  Filled 2021-07-07: qty 250

## 2021-07-24 ENCOUNTER — Encounter

## 2021-07-24 ENCOUNTER — Inpatient Hospital Stay: Admit: 2021-07-24 | Payer: MEDICARE | Primary: Family Medicine

## 2021-07-24 DIAGNOSIS — E876 Hypokalemia: Secondary | ICD-10-CM

## 2021-07-24 LAB — COMPREHENSIVE METABOLIC PANEL
ALT: 22 U/L (ref 13–56)
AST: 15 U/L (ref 10–38)
Albumin/Globulin Ratio: 1.3 (ref 0.8–1.7)
Albumin: 3.7 g/dL (ref 3.4–5.0)
Alk Phosphatase: 90 U/L (ref 45–117)
Anion Gap: 6 mmol/L (ref 3.0–18)
BUN: 10 MG/DL (ref 7.0–18)
Bun/Cre Ratio: 17 (ref 12–20)
CO2: 28 mmol/L (ref 21–32)
Calcium: 8.8 MG/DL (ref 8.5–10.1)
Chloride: 110 mmol/L (ref 100–111)
Creatinine: 0.6 MG/DL (ref 0.6–1.3)
Est, Glom Filt Rate: >60 mL/min/{1.73_m2} (ref >60)
Globulin: 2.8 g/dL (ref 2.0–4.0)
Glucose: 97 mg/dL (ref 74–99)
Potassium: 3.8 mmol/L (ref 3.5–5.5)
Sodium: 144 mmol/L (ref 136–145)
Total Bilirubin: 0.7 MG/DL (ref 0.2–1.0)
Total Protein: 6.5 g/dL (ref 6.4–8.2)

## 2021-07-24 LAB — HEMOGLOBIN A1C, EXTERNAL: Hemoglobin A1C, External: 5.5 %

## 2021-07-31 ENCOUNTER — Ambulatory Visit: Admit: 2021-07-31 | Discharge: 2021-07-31 | Payer: MEDICARE | Attending: Family Medicine | Primary: Family Medicine

## 2021-07-31 ENCOUNTER — Inpatient Hospital Stay: Admit: 2021-07-31 | Payer: MEDICARE | Primary: Family Medicine

## 2021-07-31 ENCOUNTER — Encounter: Attending: Family Medicine | Primary: Family Medicine

## 2021-07-31 DIAGNOSIS — I10 Essential (primary) hypertension: Secondary | ICD-10-CM

## 2021-07-31 DIAGNOSIS — Z1231 Encounter for screening mammogram for malignant neoplasm of breast: Secondary | ICD-10-CM

## 2021-07-31 NOTE — Progress Notes (Signed)
SUBJECTIVE  Ff-up    HTN- currently on aldactone  Hypokalemia- endo eval neg for hyperaldo, currently on aldactone and kcl 40 meqs TID with normal K levels.     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     DM-diet controlled, she does not check glucose at home. She is no longer on metformin.      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 Says weight loss improved respirotary symptoms. denies cough, sob, wheezing. She says last albuterol use was a year ago. Usually heat triggers respiratory symptoms.      ROS:  See HPI, all others negative        Patient Active Problem List   Diagnosis Code    Chronic infection of sinus J32.9    Type 2 diabetes mellitus without complication (HCC) E11.9    Incontinence in female R32    Candidal intertrigo B37.2    Arthritis, multiple joint involvement M12.9    Essential hypertension I10    COPD (chronic obstructive pulmonary disease) (HCC) J44.9    Hyperlipidemia E78.5    Vitamin D deficiency E55.9    Internal hemorrhoids K64.8    Colon polyps K63.5    Hypokalemia E87.6    Hypomagnesemia E83.42    Syncope R55    Elevated troponin R77.8    Short gut syndrome 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.2    Hypocalcemia E83.51    Mild intermittent asthma J45.20    Thrombocytopenia, unspecified D69.6    Arthritis of knee M17.10    Non-compliance Z91.199    Arthrofibrosis of knee joint, 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         Current Outpatient Medications:     aspirin 81 MG EC tablet, Take 81 mg by mouth 2 times daily, Disp: , Rfl:     calcium citrate (CALCITRATE) 950 (200 Ca) MG tablet, Take 200 mg by mouth daily, Disp: , Rfl:     clindamycin (CLEOCIN) 300 MG capsule, 3 po one hour prior to dental work., Disp: , Rfl:     cyanocobalamin 1000 MCG tablet, Take 1,000 mcg by mouth daily, Disp: , Rfl:     ergocalciferol (ERGOCALCIFEROL) 1.25 MG (50000 UT) capsule, Take 50,000 Units by mouth every 7 days, Disp: , Rfl:      magnesium oxide (MAG-OX) 400 MG tablet, Take 400 mg by mouth daily, Disp: , Rfl:     nystatin (MYCOSTATIN) 100000 UNIT/GM cream, Apply topically 2 times daily, Disp: , Rfl:     potassium chloride (KLOR-CON M) 20 MEQ extended release tablet, Take 40 mEq by mouth 3 times daily, Disp: , Rfl:     spironolactone (ALDACTONE) 25 MG tablet, Take 25 mg by mouth daily, Disp: , Rfl:     thiamine 100 MG tablet, Take 100 mg by mouth daily, Disp: , Rfl:         Allergies   Allergen Reactions    Latex Rash    Acetaminophen-Codeine Nausea Only and Other (comments)     Nausea and pains in stomach    Codeine Other (comments)     Nausea and abd pain    Butrans [Buprenorphine] Rash    Iodine Rash    Lactose Diarrhea     Lactose intolerance    Other Plant, Educational psychologist, Environmental Hives     Conductive electrodes    Penicillins Itching       Past Medical History:   Diagnosis Date    Arthritis  knee, arms,     Asthma     resolved after gastric bypass    Chronic obstructive pulmonary disease (HCC)     no meds, since gastric bypass    Chronic pain     Diabetes (HCC)     no meds    H/O sinusitis     Hx SBO     multiple surgeries prior to 2002    Hypercholesterolemia     Hypertension     Sleep apnea     NOT USING     Stress incontinence     Thrombocytopenia (HCC)     Toe fracture, left August 2015    4th toe       Social History     Socioeconomic History    Marital status: SINGLE     Spouse name: Not on file    Number of children: Not on file    Years of education: Not on file    Highest education level: Not on file   Occupational History    Not on file   Tobacco Use    Smoking status: Never    Smokeless tobacco: Never   Vaping Use    Vaping Use: Never used   Substance and Sexual Activity    Alcohol use: No     Alcohol/week: 0.0 standard drinks    Drug use: No    Sexual activity: Not Currently     Partners: Male   Other Topics Concern    Not on file   Social History Narrative    Retired Psychologist, occupational, 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 Denies history of smoking      Social Determinants of Psychologist, prison and probation services Strain: Low Risk     Difficulty of Paying Living Expenses: Not very hard   Food Insecurity: No Food Insecurity    Worried About Programme researcher, broadcasting/film/video in the Last Year: Never true    Barista in the Last Year: Never true   Transportation Needs: Not on file   Physical Activity: Not on file   Stress: Not on file   Social Connections: Not on file   Intimate Partner Violence: Not on file   Housing Stability: Not on file       Family History   Problem Relation Age of Onset    Hypertension Mother     Stroke Mother     Diabetes Mother     Thyroid Disease Mother     Arthritis-rheumatoid Mother     Cancer Father         colon polpe    Cancer Maternal Aunt          OBJECTIVE    Physical Exam:     BP 138/72 (Site: Left Upper Arm, Position: Sitting, Cuff Size: Large Adult)    Pulse 73    Temp 98.7 ??F (37.1 ??C) (Temporal)    Resp 16    Ht  (1.6 m)    Wt 190 lb (86.2 kg)    SpO2 99%    BMI 33.66 kg/m??         General: alert, well-appearing,obese,AA, in no apparent distress or pain  Neck: supple, no adenopathy palpated  CVS: normal rate, regular rhythm, distinct S1 and S2  Lungs:clear to ausculation bilaterally, no crackles, wheezing or rhonchi noted  Abdomen: normoactive bowel sounds, soft, non-tender  Extremities: no edema, no cyanosis, MSK grossly normal  Skin: warm, no lesions, rashes noted  Psych:  mood and affect normal  CMP:   Lab Results   Component Value Date/Time    NA 144 07/24/2021 08:08 AM    K 3.8 07/24/2021 08:08 AM    CL 110 07/24/2021 08:08 AM    CO2 28 07/24/2021 08:08 AM    BUN 10 07/24/2021 08:08 AM    CREATININE 0.60 07/24/2021 08:08 AM    GLUCOSE 97 07/24/2021 08:08 AM    CALCIUM 8.8 07/24/2021 08:08 AM    PROT 6.5 07/24/2021 08:08 AM    LABALBU 3.7 07/24/2021 08:08 AM    BILITOT 0.7 07/24/2021 08:08 AM    AST 15 07/24/2021 08:08 AM    ALT 22 07/24/2021 08:08 AM        CBC:   Lab Results   Component Value  Date/Time    WBC 6.3 01/13/2021 09:04 AM    RBC 5.08 01/13/2021 09:04 AM    HGB 13.6 01/13/2021 09:04 AM    HCT 45.0 01/13/2021 09:04 AM    MCV 88.6 01/13/2021 09:04 AM    MCH 26.8 01/13/2021 09:04 AM    MCHC 30.2 01/13/2021 09:04 AM    RDW 13.6 01/13/2021 09:04 AM    PLT 176 01/13/2021 09:04 AM    MPV 13.1 01/13/2021 09:04 AM        Lipids   Lab Results   Component Value Date/Time    CHOL 160 09/03/2020 07:47 AM    TRIG 104 09/03/2020 07:47 AM    LDLCALC 68.2 09/03/2020 07:47 AM    LABVLDL 20.8 09/03/2020 07:47 AM    CHOLHDLRATIO 2.3 09/03/2020 07:47 AM         Imaging results last 24 hrs :MAM TOMO DIGITAL SCREEN BILATERAL    Result Date: 07/31/2021  BIRADS: 1-NEGATIVE BREAST DENSITY: C-The breasts are heterogeneously dense, which may obscure small masses. SCREENING MAMMOGRAM BILATERAL  3D tomosynthesis HISTORY: Screening. COMPARISON: 22 and other priors FINDINGS: 2D and 3D images were performed. Digital mammograms were performed. No suspicious microcalcifications, masses, or areas of architectural distortion. Interpretation performed in conjunction with computed assisted detection system.     No evidence of malignancy.  Suggest routine follow-up.      Imaging results impression onlyMAM TOMO DIGITAL SCREEN BILATERAL    Result Date: 07/31/2021  No evidence of malignancy.  Suggest routine follow-up.     No orders to display       A1c:   Hemoglobin A1C   Date Value Ref Range Status   04/18/2021 5.7 (H) 4.2 - 5.6 % Final     Comment:     (NOTE)  HbA1C Interpretive Ranges  <5.7              Normal  5.7 - 6.4         Consider Prediabetes  >6.5              Consider Diabetes     12/20/2020 5.7 (H) 4.2 - 5.6 % Final     Comment:     (NOTE)  HbA1C Interpretive Ranges  <5.7              Normal  5.7 - 6.4         Consider Prediabetes  >6.5              Consider Diabetes     10/21/2020 5.9 (H) 4.2 - 5.6 % Final     Comment:     (NOTE)  HbA1C Interpretive Ranges  <5.7  Normal  5.7 - 6.4         Consider  Prediabetes  >6.5              Consider Diabetes         ASSESSMENT/PLAN  Diagnoses and all orders for this visit:     Essential hypertension  controlled w/ hypoKalemia issues  cont aldactone 25 mg  Cont kcl 20 meqs TID  DASH diet, BP Log  CMP prior to next visit     Diabetes mellitus type 2, diet-controlled (HCC)  Off meds after gastric bypass 10/19  Monitoring   Check cmp/lipid panel/a1c/microalbumin prior to next visit     Mild intermittent asthma, unspecified whether complicated  Controlled  Cont prn albuterol   flu vaccine given     Pure hypercholesterolemia   off statin after wt loss/gastric bypass 2019  Monitoring      BMI 33  S/p gastric bypass    Hypokalemia  See #1    Vitamin D deficiency   s/p gastric bypass  Cont 50 k weekly  Monitoring    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 Patient has provided input and agrees with goals.

## 2021-07-31 NOTE — Patient Instructions (Signed)
FINANCIAL RESOURCES    [place] Financial Assistance  What they offer: The [place] Financial Assistance Program helps uninsured patients who do not qualify for government-sponsored health insurance and cannot afford to pay for their medical care. Insured patient may also qualify for assistance based on family income, family size, and medical needs.   Phone Number: [phone]  How to apply for the [place] Financial Assistance Program:            #    Option 1: To apply for financial assistance, a patient (or their family or other provider) should fill out the Financial Assistance Application. Copies of the Financial Assistance Application and the FAP may be obtained for free by calling the [place] customer service department at [phone].       #    Option 2:  The Financial Assistance Application and policy may be obtained for free by downloading a copy from the [place] website:                     #   https://www.bonsecurs.com/patient-resources/financial-assistance                     #   Applications are available in several languages on the website    The [place] Cares Uninsured Program       www.pafcares.org  What they offer: The [place] Cares Uninsured Program can assist you if you???re uninsured and have been diagnosed with chronic, debilitating, or life-threatening disease.   Phone Number: 1-800-532-5274  Website: www.pafcares.org    UTILITIES    CommonHelp  What they offer: Partnership with the [place] Department of Social Services. Assist with   finding and applying for government funded programs and benefits.  You can also update  your benefits or report changes through CommonHelp.  Website: https://www.commonhelp.[place].gov/  Phone Number: 833-5CALLVA (833-522-5582)      [employer]  o What they offer: EnergyShare is [employer]???s energy assistance program of last resort for                 anyone who faces financial hardships from unemployment or family  crisis.        o Phone Number: 804-366-8280        o Address: [address], [place] [address]        o Website: https://www.dominion[employer].com/[place]/billing/billing-options/energyshare     Energy Assistance Programs (EAP) - Department of Social Services          o What they offer: EAP assists low income households in meeting their immediate home energy needs.        o Phone Number: 800-552-3431 or contact your local department of social services        o Website: https://www.dss.[place].gov/benefit/ea/     Good Rx   o What they offer: Good Rx tracks prescription drug prices and provides free drug coupons for discounts on medications.   o Website: https://www.goodrx.com/    NeedyMeds   o What they offer: NeedyMeds offers free information on medications and healthcare cost savings programs including prescription assistance programs, coupons, and discount programs.   o Website: https://www.needymeds.org/   o Helpline: 800-503-6897     RX Assist   o What they offer: Information about free and low-cost medicine programs.   o Website: https://www.rxassist.org/    [employer] $4 Prescription Program   o What they offer: Prescription Program includes up to a 30-day supply for $4 and a 90-day supply for $  10 of some covered generic drugs at commonly prescribed dosages   o Website: https://www.walmart.com/cp/4-prescriptions/1078664      The Co-Pay Relief Program  What they offer: The Co-Pay Relief Program provides you with direct prescription co-payment assistance, if you are an insured [place] citizen and financially and medically qualify, including Medicare Part D beneficiaries who require assistance with their prescription drug co-payments.   Phone toll-free: 1-866-512-3861  Website: www.copay.org    The Partnership for Prescription Assistance   What they offer:  The Partnership for Prescription Assistance can help you if you lack Prescription coverage to get the medicines you need through the public or private program  that is right for you.  Phone toll-free: 1-888-477-2669  Website:  www.pparx.org    [place] Drug Card Program  o    What they offer:   The [place] Drug Card Program gives you and your family access to a free Prescription Drug Card program and savings of up to 75% at more than 50,000 national and Regional pharmacies.  Phone toll-free: 1-800-321-6755  Website: www.[place]adrugcard.com    additional resources?  Call 211 or Find Help: https://www.findhelp.org/ FOOD RESOURCES    [employer] of [place]  What they offer: Assistance with finding a food pantry, soup kitchen or resource near you.  Website: https://foodbankonline.org/get-help/community-resources/  Provide instructions for assistance with SNAP (Food Stamps) application on this site.     SNAP ([place]ely Food Stamps)  What they offer: SNAP is used like cash to buy eligible food items from authorized retailers.  Apply for benefits online: https://www.commonhelp.[place].gov/  Apply for benefits by telephone: 800-552-3431    Meals on Wheels  What they offer: Meals on Wheels is a program that delivers meals to individuals at home who are unable to purchase or prepare their own meals.   Website: https://feedmore.org/how-we-help/meals-on-wheels/  Requirements can vary by program and areas served.   To apply:  Contact [facility] [place]: [phone] (Mon -Fri 8:30 a.m. to 4:30 p.m.)  Coverage Area:  [place], [place], [place], [place], [place], [place], [place], [place]  Website: www.ssseva.org/contact-us/  Contact [place] [facility] [employer] [facility]: [phone]   Coverage Areas: [place], [place], [place], [place], [place], [place].    [facility] [employer] [facility]  What they offer:  [facility] provides comprehensive services to the disadvantaged/low-income community, homebound seniors and homeless in [place], [place], and [place].  Phone  Number: [phone]  Services:  Food pantry, Soup kitchen, Senior Grocery Delivery Program, Food Bank, Provides assistance with completing SNAP applications.   Website: https://www.oasissocialministry.org/services    Need additional resources? Call 211 or Find Help: https://www.findhelp.org/

## 2021-07-31 NOTE — Progress Notes (Addendum)
Chief Complaint   Patient presents with    Asthma    Cholesterol Problem    COPD    Diabetes    Other     Hx of vitamin D def, hypokalemia,     Results     Review of results         1. "Have you been to the ER, urgent care clinic since your last visit?  Hospitalized since your last visit?" No    2. "Have you seen or consulted any other health care providers outside of the [facility] since your last visit?" No     3. For patients aged 70-75: Has the patient had a colonoscopy / FIT/ Cologuard? Yes - no Care Gap present due for 04/18/2024 for colonoscopy       If the patient is female:    4. For patients aged 29-74: Has the patient had a mammogram within the past 2 years? Yes - no Care Gap present scheduled 07/31/2021      5. For patients aged 21-65: Has the patient had a pap smear? No hx of hysterectomy    Annual eye exam: 02/20/2020 Requested from [facility] on 07/28/2021 [phone]  Pneumococcal vaccine: 06/24/2015  Flu vaccine: 04/23/2021  Patient instructed to remove shoes:Yes

## 2021-08-11 ENCOUNTER — Encounter: Payer: Self-pay | Admitting: Oncology

## 2021-08-11 ENCOUNTER — Other Ambulatory Visit: Payer: Self-pay

## 2021-08-11 DIAGNOSIS — D500 Iron deficiency anemia secondary to blood loss (chronic): Secondary | ICD-10-CM

## 2021-08-13 ENCOUNTER — Other Ambulatory Visit: Payer: Self-pay | Admitting: *Deleted

## 2021-08-13 ENCOUNTER — Encounter: Payer: Self-pay | Admitting: Gastroenterology

## 2021-08-13 DIAGNOSIS — D500 Iron deficiency anemia secondary to blood loss (chronic): Secondary | ICD-10-CM

## 2021-08-14 ENCOUNTER — Ambulatory Visit: Payer: Medicare HMO | Admitting: Certified Registered Nurse Anesthetist

## 2021-08-14 ENCOUNTER — Other Ambulatory Visit: Payer: Self-pay

## 2021-08-14 ENCOUNTER — Encounter: Payer: Self-pay | Admitting: Gastroenterology

## 2021-08-14 ENCOUNTER — Encounter
Admission: RE | Disposition: A | Discharge status: Home / Self Care | Payer: Self-pay | Source: Home / Self Care | Attending: Gastroenterology

## 2021-08-14 ENCOUNTER — Ambulatory Visit
Admission: RE | Admit: 2021-08-14 | Discharge: 2021-08-14 | Disposition: A | Discharge status: Home / Self Care | Payer: Medicare HMO | Attending: Gastroenterology | Admitting: Gastroenterology

## 2021-08-14 DIAGNOSIS — Z87891 Personal history of nicotine dependence: Secondary | ICD-10-CM | POA: Diagnosis not present

## 2021-08-14 DIAGNOSIS — K5730 Diverticulosis of large intestine without perforation or abscess without bleeding: Secondary | ICD-10-CM | POA: Diagnosis not present

## 2021-08-14 DIAGNOSIS — Z8673 Personal history of transient ischemic attack (TIA), and cerebral infarction without residual deficits: Secondary | ICD-10-CM | POA: Insufficient documentation

## 2021-08-14 DIAGNOSIS — D509 Iron deficiency anemia, unspecified: Secondary | ICD-10-CM | POA: Diagnosis not present

## 2021-08-14 DIAGNOSIS — Z8619 Personal history of other infectious and parasitic diseases: Secondary | ICD-10-CM | POA: Insufficient documentation

## 2021-08-14 DIAGNOSIS — F32A Depression, unspecified: Secondary | ICD-10-CM | POA: Diagnosis not present

## 2021-08-14 DIAGNOSIS — F419 Anxiety disorder, unspecified: Secondary | ICD-10-CM | POA: Insufficient documentation

## 2021-08-14 DIAGNOSIS — E039 Hypothyroidism, unspecified: Secondary | ICD-10-CM | POA: Insufficient documentation

## 2021-08-14 DIAGNOSIS — K2950 Unspecified chronic gastritis without bleeding: Secondary | ICD-10-CM | POA: Diagnosis not present

## 2021-08-14 DIAGNOSIS — E7800 Pure hypercholesterolemia, unspecified: Secondary | ICD-10-CM | POA: Insufficient documentation

## 2021-08-14 DIAGNOSIS — I252 Old myocardial infarction: Secondary | ICD-10-CM | POA: Diagnosis not present

## 2021-08-14 DIAGNOSIS — Z8719 Personal history of other diseases of the digestive system: Secondary | ICD-10-CM | POA: Insufficient documentation

## 2021-08-14 DIAGNOSIS — K31A11 Gastric intestinal metaplasia without dysplasia, involving the antrum: Secondary | ICD-10-CM | POA: Diagnosis not present

## 2021-08-14 DIAGNOSIS — K2100 Gastro-esophageal reflux disease with esophagitis, without bleeding: Secondary | ICD-10-CM | POA: Diagnosis not present

## 2021-08-14 DIAGNOSIS — Z9049 Acquired absence of other specified parts of digestive tract: Secondary | ICD-10-CM | POA: Diagnosis not present

## 2021-08-14 HISTORY — PX: COLONOSCOPY WITH PROPOFOL: SHX5780

## 2021-08-14 HISTORY — PX: ESOPHAGOGASTRODUODENOSCOPY (EGD) WITH PROPOFOL: SHX5813

## 2021-08-14 SURGERY — COLONOSCOPY WITH PROPOFOL
Anesthesia: General

## 2021-08-14 MED ORDER — LIDOCAINE HCL (PF) 2 % IJ SOLN
INTRAMUSCULAR | Status: AC
  Filled 2021-08-14: qty 10

## 2021-08-14 MED ORDER — PROPOFOL 500 MG/50ML IV EMUL
INTRAVENOUS | Status: AC
  Filled 2021-08-14: qty 300

## 2021-08-14 MED ORDER — SODIUM CHLORIDE 0.9 % IV SOLN: INTRAVENOUS | Status: DC

## 2021-08-14 MED ORDER — STERILE WATER FOR IRRIGATION IR SOLN
Status: DC | PRN
  Administered 2021-08-14: 50 mL

## 2021-08-14 MED ORDER — PROPOFOL 10 MG/ML IV BOLUS
INTRAVENOUS | Status: DC | PRN
  Administered 2021-08-14: 20 mg via INTRAVENOUS
  Administered 2021-08-14: 50 mg via INTRAVENOUS

## 2021-08-14 MED ORDER — ONDANSETRON HCL 4 MG/2ML IJ SOLN
INTRAMUSCULAR | Status: DC | PRN
  Administered 2021-08-14: 4 mg via INTRAVENOUS

## 2021-08-14 MED ORDER — PROPOFOL 500 MG/50ML IV EMUL
INTRAVENOUS | Status: DC | PRN
  Administered 2021-08-14: 160 ug/kg/min via INTRAVENOUS

## 2021-08-14 MED ORDER — GLYCOPYRROLATE 0.2 MG/ML IJ SOLN
INTRAMUSCULAR | Status: AC
  Filled 2021-08-14: qty 1

## 2021-08-14 MED ORDER — ONDANSETRON HCL 4 MG/2ML IJ SOLN
INTRAMUSCULAR | Status: AC
  Filled 2021-08-14: qty 2

## 2021-08-14 MED ORDER — LIDOCAINE HCL (CARDIAC) PF 100 MG/5ML IV SOSY
PREFILLED_SYRINGE | INTRAVENOUS | Status: DC | PRN
Start: 2021-08-14 — End: 2021-08-14
  Administered 2021-08-14: 100 mg via INTRAVENOUS

## 2021-08-14 NOTE — H&P (Signed)
? ?Pre-Procedure H&P ?  ?Patient ID: Brittany [name] is a 71 y.o. female. ? ?Gastroenterology Provider: Annamaria Helling, DO ? ?Referring Provider: Laurine Blazer, PA ?PCP: Derinda Late, MD ? ?Date: 08/14/2021 ? ?HPI ?Brittany [name] is a 71 y.o. female who presents today for Esophagogastroduodenoscopy and Colonoscopy for gerd, iron deficiency anemia. ?Pt has ntoed worsening reflux with eating- specifically trigger foods ie- spicy. Typically eats 1 meal per day. Most sx are at night. No n/v ?Weight decreased from 140 to 134lbs since January, but was also hospitalized for pna. ?BM daily is formed to then loose later in the day. No melena or hematochezia. ? ?Receiving iron infusions. Hgb 8.4 mcv 91 plt 226 ?Cmp wnl. Cr 0.92. iron 40 sat 8% ? ?S/p nissen fundoplication, hysterectomy, and cholecystectomy ? ?07/2020 EGD- int metaplasia present- body and antrum- recommend repeat in 3 years. Tertiary contractions present. EOE bx negative ? ?10/2018 EGD and Colonoscopy- gastritis negative for HP, but intestinal metaplasia present. Melanosis coli and two ta. Ih present. Negative bx for microscopic colitis. ? ?Past Medical History:  ?Diagnosis Date  ? Allergic genetic state   ? Anemia   ? Anxiety   ? Arthritis   ? Cataract   ? Chicken pox   ? Clostridioides difficile infection 09/05/2018  ? Complication of anesthesia   ? Depression   ? Family history of adverse reaction to anesthesia   ? mother - PONV  ? GERD (gastroesophageal reflux disease)   ? H/O hiatal hernia   ? History of GI bleed   ? Hypercholesteremia   ? Hyperlipidemia   ? Hypothyroidism   ? Osteoporosis   ? PONV (postoperative nausea and vomiting)   ? Stroke [facility]) 11/2017  ? Thyroid disease   ? Wears dentures   ? partial upper, full lower  ? ? ?Past Surgical History:  ?Procedure Laterality Date  ? ABDOMINAL HYSTERECTOMY  05/18/2000  ? total  ? BRAVO [place] STUDY  05/26/2012  ? Procedure: BRAVO [place];  Surgeon: Lear Ng, MD;  Location: [facility] ENDOSCOPY;   Service: Endoscopy;  Laterality: N/A;  ? CATARACT EXTRACTION    ? CHOLECYSTECTOMY  05/18/2004  ? COLONOSCOPY WITH PROPOFOL N/A 10/24/2018  ? Procedure: COLONOSCOPY WITH PROPOFOL;  Surgeon: Manya Silvas, MD;  Location: [facility] ENDOSCOPY;  Service: Endoscopy;  Laterality: N/A;  ? detatched retina surgery    ? ESOPHAGOGASTRODUODENOSCOPY  05/26/2012  ? Procedure: ESOPHAGOGASTRODUODENOSCOPY (EGD);  Surgeon: Lear Ng, MD;  Location: [facility];  Service: Endoscopy;  Laterality: N/A;  ? ESOPHAGOGASTRODUODENOSCOPY  05/30/2012  ? Procedure: ESOPHAGOGASTRODUODENOSCOPY (EGD);  Surgeon: Cleotis Nipper, MD;  Location: [facility] ENDOSCOPY;  Service: Endoscopy;  Laterality: N/A;  ? ESOPHAGOGASTRODUODENOSCOPY N/A 08/31/2012  ? Procedure: ESOPHAGOGASTRODUODENOSCOPY (EGD);  Surgeon: Wonda Horner, MD;  Location: [facility] ENDOSCOPY;  Service: Endoscopy;  Laterality: N/A;  ? ESOPHAGOGASTRODUODENOSCOPY (EGD) WITH PROPOFOL N/A 10/24/2018  ? Procedure: ESOPHAGOGASTRODUODENOSCOPY (EGD) WITH PROPOFOL;  Surgeon: Manya Silvas, MD;  Location: [facility] ENDOSCOPY;  Service: Endoscopy;  Laterality: N/A;  ? ESOPHAGOGASTRODUODENOSCOPY (EGD) WITH PROPOFOL N/A 08/05/2020  ? Procedure: ESOPHAGOGASTRODUODENOSCOPY (EGD) WITH PROPOFOL;  Surgeon: [place], Benay Pike, MD;  Location: [facility] ENDOSCOPY;  Service: Gastroenterology;  Laterality: N/A;  ? EYE SURGERY    ? FOOT SURGERY Right   ? HIP SURGERY  1970s  ? "hip sunk in"  ? left eye surgery for detached retina    ? LIVER BIOPSY  2007  ? NECK SURGERY  05/18/2010  ? PLANTAR FASCIA RELEASE Left 10/10/2020  ?  Procedure: ENDOSCOPIC PLANTAR FASC. RELEASE;  Surgeon: [name], DPM;  Location: [place];  Service: Podiatry;  Laterality: Left;  ANESTHESIA- CHOICE  ? ROTATOR CUFF REPAIR  03/18/2012  ? right side  ? THYROID SURGERY    ? nodule removed, right side  ? TMJ ARTHROPLASTY  05/18/1990  ? TUBAL LIGATION  05/19/1979  ? ? ?Family History ?No h/o GI disease or malignancy ? ?Review of Systems   ?Constitutional:  Positive for unexpected weight change. Negative for activity change, appetite change, chills, diaphoresis, fatigue and fever.  ?HENT:  Positive for trouble swallowing. Negative for voice change.   ?Respiratory:  Negative for shortness of breath and wheezing.   ?Cardiovascular:  Negative for chest pain, palpitations and leg swelling.  ?Gastrointestinal:  Negative for abdominal distention, abdominal pain, anal bleeding, blood in stool, constipation, diarrhea, nausea, rectal pain and vomiting.  ?     Reflux  ?Musculoskeletal:  Negative for arthralgias and myalgias.  ?Skin:  Negative for color change and pallor.  ?Neurological:  Negative for dizziness, syncope and weakness.  ?Psychiatric/Behavioral:  Negative for confusion.   ?All other systems reviewed and are negative.  ? ?Medications ?No current facility-administered medications on file prior to encounter.  ? ?Current Outpatient Medications on File Prior to Encounter  ?Medication Sig Dispense Refill  ? atorvastatin (LIPITOR) 40 MG tablet Take 40 mg by mouth daily.    ? betamethasone dipropionate 0.05 % cream Apply topically daily.    ? levothyroxine (SYNTHROID, LEVOTHROID) 25 MCG tablet Take 25 mcg by mouth every morning.     ? montelukast (SINGULAIR) 10 MG tablet Take 10 mg by mouth at bedtime.    ? pantoprazole (PROTONIX) 40 MG tablet Take 40 mg by mouth daily.    ? predniSONE (DELTASONE) 10 MG tablet Take 10 mg by mouth daily with breakfast.    ? Cholecalciferol 50 MCG (2000 UT) TABS Take 2,000 Units by mouth daily.    ? citalopram (CELEXA) 20 MG tablet Take 20 mg by mouth daily.    ? clorazepate (TRANXENE) 3.75 MG tablet Take 7.5 mg by mouth See admin instructions. Take 1 tablet (3.'75mg'$ ) by mouth twice daily - may take 1 additional tablet (3.'75mg'$ ) by mouth at midday if needed    ? gabapentin (NEURONTIN) 300 MG capsule Take 1 capsule (300 mg total) by mouth at bedtime. 30 capsule 3  ? traZODone (DESYREL) 50 MG tablet Take 50 mg by mouth at  bedtime.    ? vitamin B-12 (CYANOCOBALAMIN) 1000 MCG tablet Take 1,000 mcg by mouth daily.    ? ? ?Pertinent medications related to GI and procedure were reviewed by me with the patient prior to the procedure ? ? ?Current Facility-Administered Medications:  ?  0.9 %  sodium chloride infusion, , Intravenous, Continuous, Annamaria Helling, DO, Last Rate: 20 mL/hr at 08/14/21 0742, New Bag at 08/14/21 4268 ? sodium chloride 20 mL/hr at 08/14/21 3419  ?  ?  ? ?Allergies  ?Allergen Reactions  ? Lithium Anaphylaxis  ? Fentanyl Other (See Comments)  ?  Extreme Sedation ?Other reaction(s): Lethargy (intolerance) ?Extreme sedation ?  ? Morphine Nausea And Vomiting  ? Pseudoephedrine Hypertension  ? ?Allergies were reviewed by me prior to the procedure ? ?Objective  ? ? ?Vitals:  ? 08/14/21 0738  ?BP: (!) 94/51  ?Pulse: 64  ?Resp: 18  ?Temp: 97.8 ?F (36.6 ?C)  ?TempSrc: Temporal  ?SpO2: 100%  ?Weight: 60.8 kg  ?Height: '5\' 5"'$  (1.651 m)  ? ? ? ?  Physical Exam ?Vitals and nursing note reviewed.  ?Constitutional:   ?   General: She is not in acute distress. ?   Appearance: Normal appearance. She is not ill-appearing, toxic-appearing or diaphoretic.  ?HENT:  ?   Head: Normocephalic and atraumatic.  ?   Nose: Nose normal.  ?   Mouth/Throat:  ?   Mouth: Mucous membranes are moist.  ?   Pharynx: Oropharynx is clear.  ?   Comments: Missing upper teeth, gums with implant studs on bottom ?Eyes:  ?   General: No scleral icterus. ?   Extraocular Movements: Extraocular movements intact.  ?Cardiovascular:  ?   Rate and Rhythm: Normal rate and regular rhythm.  ?   Heart sounds: Normal heart sounds. No murmur heard. ?  No friction rub. No gallop.  ?Pulmonary:  ?   Effort: Pulmonary effort is normal. No respiratory distress.  ?   Breath sounds: Normal breath sounds. No wheezing, rhonchi or rales.  ?Abdominal:  ?   General: Bowel sounds are normal. There is no distension.  ?   Palpations: Abdomen is soft.  ?   Tenderness: There is no  abdominal tenderness. There is no guarding or rebound.  ?Musculoskeletal:  ?   Cervical back: Neck supple.  ?   Right lower leg: No edema.  ?   Left lower leg: No edema.  ?Skin: ?   General: Skin is warm and dry.

## 2021-08-14 NOTE — Transfer of Care (Signed)
Immediate Anesthesia Transfer of Care Note ? ?Patient: Brittany [name] ? ?Procedure(s) Performed: COLONOSCOPY WITH PROPOFOL ?ESOPHAGOGASTRODUODENOSCOPY (EGD) WITH PROPOFOL ? ?Patient Location: PACU ? ?Anesthesia Type:General ? ?Level of Consciousness: drowsy ? ?Airway & Oxygen Therapy: Patient Spontanous Breathing ? ?Post-op Assessment: Report given to RN ? ?Post vital signs: Reviewed and stable ? ?Last Vitals:  ?Vitals Value Taken Time  ?BP    ?Temp 35.7 ?C 08/14/21 0849  ?Pulse 60 08/14/21 0849  ?Resp 14 08/14/21 0849  ?SpO2 100 % 08/14/21 0849  ? ? ?Last Pain:  ?Vitals:  ? 08/14/21 0849  ?TempSrc: Temporal  ?PainSc: Asleep  ?   ? ?  ? ?Complications: No notable events documented. ?

## 2021-08-14 NOTE — Anesthesia Procedure Notes (Signed)
Date/Time: 08/14/2021 8:18 AM ?Performed by: Demetrius Charity, CRNA ?Pre-anesthesia Checklist: Patient identified, Emergency Drugs available, Suction available, Patient being monitored and Timeout performed ?Patient Re-evaluated:Patient Re-evaluated prior to induction ?Oxygen Delivery Method: Nasal cannula ?Induction Type: IV induction ?Placement Confirmation: CO2 detector and positive ETCO2 ? ? ? ? ?

## 2021-08-14 NOTE — Anesthesia Preprocedure Evaluation (Signed)
Anesthesia Evaluation  ?Patient identified by MRN, date of birth, ID band ?Patient awake ? ? ? ?Reviewed: ?Allergy & Precautions, NPO status , Patient's Chart, lab work & pertinent test results ? ?Airway ?Mallampati: II ? ?TM Distance: >3 FB ?Neck ROM: full ? ? ? Dental ? ?(+) Lower Dentures, Partial Upper ?  ?Pulmonary ?neg pulmonary ROS, former smoker,  ?  ?Pulmonary exam normal ?breath sounds clear to auscultation ? ? ? ? ? ? Cardiovascular ?+ Past MI  ?negative cardio ROS ?Normal cardiovascular exam ?Rhythm:Regular Rate:Normal ? ? ?  ?Neuro/Psych ?Anxiety Depression negative neurological ROS ? negative psych ROS  ? GI/Hepatic ?negative GI ROS, Neg liver ROS, hiatal hernia,   ?Endo/Other  ?negative endocrine ROSHypothyroidism  ? Renal/GU ?negative Renal ROS  ?negative genitourinary ?  ?Musculoskeletal ? ? Abdominal ?Normal abdominal exam  (+)   ?Peds ?negative pediatric ROS ?(+)  Hematology ?negative hematology ROS ?(+) Blood dyscrasia, anemia ,   ?Anesthesia Other Findings ?Past Medical History: ?No date: Allergic genetic state ?No date: Anemia ?No date: Anxiety ?No date: Arthritis ?No date: Cataract ?No date: Chicken pox ?09/05/2018: Clostridioides difficile infection ?No date: Complication of anesthesia ?No date: Depression ?No date: Family history of adverse reaction to anesthesia ?    Comment:  mother - PONV ?No date: GERD (gastroesophageal reflux disease) ?No date: H/O hiatal hernia ?No date: History of GI bleed ?No date: Hypercholesteremia ?No date: Hyperlipidemia ?No date: Hypothyroidism ?No date: Osteoporosis ?No date: PONV (postoperative nausea and vomiting) ?11/2017: Stroke [facility]) ?No date: Thyroid disease ?No date: Wears dentures ?    Comment:  partial upper, full lower ? ?Past Surgical History: ?05/18/2000: ABDOMINAL HYSTERECTOMY ?    Comment:  total ?05/26/2012: BRAVO [place] STUDY ?    Comment:  Procedure: BRAVO [place];  Surgeon: Lear Ng, MD;   Location: [facility] ENDOSCOPY;  Service:  ?             Endoscopy;  Laterality: N/A; ?No date: CATARACT EXTRACTION ?05/18/2004: CHOLECYSTECTOMY ?10/24/2018: COLONOSCOPY WITH PROPOFOL; N/A ?    Comment:  Procedure: COLONOSCOPY WITH PROPOFOL;  Surgeon: Vira Agar, ?             Gavin Pound, MD;  Location: [facility] ENDOSCOPY;  Service:  ?             Endoscopy;  Laterality: N/A; ?No date: detatched retina surgery ?05/26/2012: ESOPHAGOGASTRODUODENOSCOPY ?    Comment:  Procedure: ESOPHAGOGASTRODUODENOSCOPY (EGD);  Surgeon:  ?             Lear Ng, MD;  Location: [facility] ENDOSCOPY;   ?             Service: Endoscopy;  Laterality: N/A; ?05/30/2012: ESOPHAGOGASTRODUODENOSCOPY ?    Comment:  Procedure: ESOPHAGOGASTRODUODENOSCOPY (EGD);  Surgeon:  ?             Cleotis Nipper, MD;  Location: [facility];  Service:  ?             Endoscopy;  Laterality: N/A; ?08/31/2012: ESOPHAGOGASTRODUODENOSCOPY; N/A ?    Comment:  Procedure: ESOPHAGOGASTRODUODENOSCOPY (EGD);  Surgeon:  ?             Wonda Horner, MD;  Location: [facility] ENDOSCOPY;  Service:  ?             Endoscopy;  Laterality: N/A; ?10/24/2018: ESOPHAGOGASTRODUODENOSCOPY (EGD) WITH PROPOFOL; N/A ?    Comment:  Procedure: ESOPHAGOGASTRODUODENOSCOPY (EGD)  WITH  ?             PROPOFOL;  Surgeon: Manya Silvas, MD;  Location:  ?             [place] ENDOSCOPY;  Service: Endoscopy;  Laterality: N/A; ?08/05/2020: ESOPHAGOGASTRODUODENOSCOPY (EGD) WITH PROPOFOL; N/A ?    Comment:  Procedure: ESOPHAGOGASTRODUODENOSCOPY (EGD) WITH  ?             PROPOFOL;  Surgeon: Alice Reichert, Benay Pike, MD;  Location:  ?             [facility] ENDOSCOPY;  Service: Gastroenterology;  Laterality:  ?             N/A; ?No date: EYE SURGERY ?No date: FOOT SURGERY; Right ?1970s: HIP SURGERY ?    Comment:  "hip sunk in" ?No date: left eye surgery for detached retina ?2007: LIVER BIOPSY ?05/18/2010: NECK SURGERY ?10/10/2020: PLANTAR FASCIA RELEASE; Left ?    Comment:  Procedure: ENDOSCOPIC PLANTAR FASC. RELEASE;  Surgeon:  ?              [name], DPM;  Location: [place];   ?             Service: Podiatry;  Laterality: Left;  ANESTHESIA- CHOICE ?03/18/2012: ROTATOR CUFF REPAIR ?    Comment:  right side ?No date: THYROID SURGERY ?    Comment:  nodule removed, right side ?05/18/1990: TMJ ARTHROPLASTY ?05/19/1979: TUBAL LIGATION ? ? ? ? Reproductive/Obstetrics ?negative OB ROS ? ?  ? ? ? ? ? ? ? ? ? ? ? ? ? ?  ?  ? ? ? ? ? ? ? ? ?Anesthesia Physical ?Anesthesia Plan ? ?ASA: 3 ? ?Anesthesia Plan: General  ? ?Post-op Pain Management:   ? ?Induction: Intravenous ? ?PONV Risk Score and Plan: 1 and Ondansetron ? ?Airway Management Planned: Natural Airway and Nasal Cannula ? ?Additional Equipment:  ? ?Intra-op Plan:  ? ?Post-operative Plan:  ? ?Informed Consent: I have reviewed the patients History and Physical, chart, labs and discussed the procedure including the risks, benefits and alternatives for the proposed anesthesia with the patient or authorized representative who has indicated his/her understanding and acceptance.  ? ? ? ?Dental Advisory Given ? ?Plan Discussed with: CRNA ? ?Anesthesia Plan Comments:   ? ? ? ? ? ? ?Anesthesia Quick Evaluation ? ?

## 2021-08-14 NOTE — Op Note (Signed)
[facility] ?Gastroenterology ?Patient Name: Brittany [name] ?Procedure Date: 08/14/2021 8:03 AM ?MRN: [id number] ?Account #: [id number] ?Date of Birth: Apr 28, 1951 ?Admit Type: Outpatient ?Age: 71 ?Room: [facility] ENDO ROOM 1 ?Gender: Female ?Note Status: Finalized ?Instrument Name: Colonoscope [id number] ?Procedure:             Colonoscopy ?Indications:           Iron deficiency anemia ?Providers:             Annamaria Helling DO, DO ?Referring MD:          Caprice Renshaw MD (Referring MD) ?Medicines:             Monitored Anesthesia Care ?Complications:         No immediate complications. Estimated blood loss:  ?                       Minimal. ?Procedure:             Pre-Anesthesia Assessment: ?                       - Prior to the procedure, a History and Physical was  ?                       performed, and patient medications and allergies were  ?                       reviewed. The patient is competent. The risks and  ?                       benefits of the procedure and the sedation options and  ?                       risks were discussed with the patient. All questions  ?                       were answered and informed consent was obtained.  ?                       Patient identification and proposed procedure were  ?                       verified by the physician, the nurse, the anesthetist  ?                       and the technician in the endoscopy suite. Mental  ?                       Status Examination: alert and oriented. Airway  ?                       Examination: normal oropharyngeal airway and neck  ?                       mobility. Respiratory Examination: clear to  ?                       auscultation. CV Examination: RRR, no murmurs, no S3  ?  or S4. Prophylactic Antibiotics: The patient does not  ?                       require prophylactic antibiotics. Prior  ?                       Anticoagulants: The patient has taken no previous  ?                        anticoagulant or antiplatelet agents. ASA Grade  ?                       Assessment: III - A patient with severe systemic  ?                       disease. After reviewing the risks and benefits, the  ?                       patient was deemed in satisfactory condition to  ?                       undergo the procedure. The anesthesia plan was to use  ?                       monitored anesthesia care (MAC). Immediately prior to  ?                       administration of medications, the patient was  ?                       re-assessed for adequacy to receive sedatives. The  ?                       heart rate, respiratory rate, oxygen saturations,  ?                       blood pressure, adequacy of pulmonary ventilation, and  ?                       response to care were monitored throughout the  ?                       procedure. The physical status of the patient was  ?                       re-assessed after the procedure. ?                       After obtaining informed consent, the colonoscope was  ?                       passed under direct vision. Throughout the procedure,  ?                       the patient's blood pressure, pulse, and oxygen  ?                       saturations were monitored continuously. The  ?  Colonoscope was introduced through the anus and  ?                       advanced to the the terminal ileum, with  ?                       identification of the appendiceal orifice and IC  ?                       valve. The colonoscopy was performed without  ?                       difficulty. The patient tolerated the procedure well.  ?                       The quality of the bowel preparation was evaluated  ?                       using the BBPS [facility] Bowel Preparation Scale) with  ?                       scores of: Right Colon = 3, Transverse Colon = 3 and  ?                       Left Colon = 3 (entire mucosa seen well with no  ?                       residual staining,  small fragments of stool or opaque  ?                       liquid). The total BBPS score equals 9. The terminal  ?                       ileum, ileocecal valve, appendiceal orifice, and  ?                       rectum were photographed. ?Findings: ?     The perianal and digital rectal examinations were normal. Pertinent  ?     negatives include loose sphincter tone. ?     The terminal ileum appeared normal. Estimated blood loss: none. ?     Normal mucosa was found in the entire colon. Biopsies for histology were  ?     taken with a cold forceps from the right colon and left colon for  ?     evaluation of microscopic colitis. Estimated blood loss was minimal. ?     The entire examined colon appeared normal. ?     Retroflexion attempted multiple times, but unable to be performed due to  ?     lack of rectal compliance. Estimated blood loss: none. ?     Scattered small-mouthed diverticula were found in the sigmoid colon.  ?     Estimated blood loss: none. Estimated blood loss: none. ?Impression:            - The examined portion of the ileum was normal. ?                       - Normal mucosa in the entire examined colon. Biopsied. ?                       -  The entire examined colon is normal. ?                       - Diverticulosis in the sigmoid colon. ?Recommendation:        - Discharge patient to home. ?                       - Resume previous diet. ?                       - Continue present medications. ?                       - Await pathology results. ?                       - Repeat colonoscopy for surveillance based on  ?                       pathology results. ?                       - Return to GI office as previously scheduled. ?Procedure Code(s):     --- Professional --- ?                       [phone], Colonoscopy, flexible; with biopsy, single or  ?                       multiple ?Diagnosis Code(s):     --- Professional --- ?                       D50.9, Iron deficiency anemia, unspecified ?CPT  copyright 2019 American Medical Association. All rights reserved. ?The codes documented in this report are preliminary and upon coder review may  ?be revised to meet current compliance requirements. ?Attending Participation: ?     I personally performed the entire procedure. ?Volney American, DO ?Annamaria Helling DO, DO ?08/14/2021 8:52:35 AM ?This report has been signed electronically. ?Number of Addenda: 0 ?Note Initiated On: 08/14/2021 8:03 AM ?Scope Withdrawal Time: 0 hours 18 minutes 19 seconds  ?Total Procedure Duration: 0 hours 26 minutes 41 seconds  ?Estimated Blood Loss:  Estimated blood loss was minimal. ?     [facility] ?

## 2021-08-14 NOTE — Anesthesia Postprocedure Evaluation (Signed)
Anesthesia Post Note ? ?Patient: Brittany [name] ? ?Procedure(s) Performed: COLONOSCOPY WITH PROPOFOL ?ESOPHAGOGASTRODUODENOSCOPY (EGD) WITH PROPOFOL ? ?Patient location during evaluation: PACU ?Anesthesia Type: General ?Level of consciousness: awake and oriented ?Pain management: pain level controlled ?Vital Signs Assessment: post-procedure vital signs reviewed and stable ?Respiratory status: spontaneous breathing and respiratory function stable ?Cardiovascular status: stable ?Anesthetic complications: no ? ? ?No notable events documented. ? ? ?Last Vitals:  ?Vitals:  ? 08/14/21 0859 08/14/21 0909  ?BP: 109/65 121/80  ?Pulse: 67 76  ?Resp: 13 19  ?Temp:    ?SpO2: 100% 100%  ?  ?Last Pain:  ?Vitals:  ? 08/14/21 0909  ?TempSrc:   ?PainSc: 0-No pain  ? ? ?  ?  ?  ?  ?  ?  ? ?VAN STAVEREN,[name] ? ? ? ? ?

## 2021-08-14 NOTE — Op Note (Signed)
[facility] ?Gastroenterology ?Patient Name: Alexzandra Bilton ?Procedure Date: 08/14/2021 8:02 AM ?MRN: [id number] ?Account #: [id number] ?Date of Birth: 1951/02/24 ?Admit Type: Outpatient ?Age: 71 ?Room: [facility] ENDO ROOM 1 ?Gender: Female ?Note Status: Finalized ?Instrument Name: Upper Endoscope [id number] ?Procedure:             Upper GI endoscopy ?Indications:           Gastro-esophageal reflux disease, Iron deficiency  ?                       anemia ?Providers:             Annamaria Helling DO, DO ?Referring MD:          Caprice Renshaw MD (Referring MD) ?Medicines:             Monitored Anesthesia Care ?Complications:         No immediate complications. Estimated blood loss:  ?                       Minimal. ?Procedure:             Pre-Anesthesia Assessment: ?                       - Prior to the procedure, a History and Physical was  ?                       performed, and patient medications and allergies were  ?                       reviewed. The patient is competent. The risks and  ?                       benefits of the procedure and the sedation options and  ?                       risks were discussed with the patient. All questions  ?                       were answered and informed consent was obtained.  ?                       Patient identification and proposed procedure were  ?                       verified by the physician, the nurse, the anesthetist  ?                       and the technician in the endoscopy suite. Mental  ?                       Status Examination: alert and oriented. Airway  ?                       Examination: normal oropharyngeal airway and neck  ?                       mobility. Respiratory Examination: clear to  ?  auscultation. CV Examination: RRR, no murmurs, no S3  ?                       or S4. Prophylactic Antibiotics: The patient does not  ?                       require prophylactic antibiotics. Prior  ?                        Anticoagulants: The patient has taken no previous  ?                       anticoagulant or antiplatelet agents. ASA Grade  ?                       Assessment: III - A patient with severe systemic  ?                       disease. After reviewing the risks and benefits, the  ?                       patient was deemed in satisfactory condition to  ?                       undergo the procedure. The anesthesia plan was to use  ?                       monitored anesthesia care (MAC). Immediately prior to  ?                       administration of medications, the patient was  ?                       re-assessed for adequacy to receive sedatives. The  ?                       heart rate, respiratory rate, oxygen saturations,  ?                       blood pressure, adequacy of pulmonary ventilation, and  ?                       response to care were monitored throughout the  ?                       procedure. The physical status of the patient was  ?                       re-assessed after the procedure. ?                       After obtaining informed consent, the endoscope was  ?                       passed under direct vision. Throughout the procedure,  ?                       the patient's blood pressure, pulse, and oxygen  ?  saturations were monitored continuously. The Endoscope  ?                       was introduced through the mouth, and advanced to the  ?                       second part of duodenum. The upper GI endoscopy was  ?                       accomplished without difficulty. The patient tolerated  ?                       the procedure well. ?Findings: ?     The duodenal bulb, first portion of the duodenum and second portion of  ?     the duodenum were normal. Biopsies for histology were taken with a cold  ?     forceps for evaluation of celiac disease. Estimated blood loss was  ?     minimal. ?     Diffuse Signs of intestinal metaplasia and inflammation characterized by  ?      congestion (edema) was found in the entire examined stomach. Biopsies  ?     were taken with a cold forceps for histology. Two separate bottles- one  ?     for the antrum biopsies, the other for the body. Estimated blood loss  ?     was minimal. ?     Evidence of a Nissen fundoplication was found at the gastroesophageal  ?     junction. The wrap appeared loose. This was traversed. Estimated blood  ?     loss: none. ?     The Z-line was regular. Estimated blood loss: none. ?     Esophagogastric landmarks were identified: the gastroesophageal junction  ?     was found at 35 cm from the incisors. ?     Normal mucosa was found in the entire esophagus. The scope was  ?     withdrawn. Dilation was performed with a Maloney dilator with no  ?     resistance at 52 Fr. The dilation site was examined following endoscope  ?     reinsertion and showed no change. Estimated blood loss: none. ?     The exam was otherwise without abnormality. ?Impression:            - Normal duodenal bulb, first portion of the duodenum  ?                       and second portion of the duodenum. Biopsied. ?                       - Gastritis. Biopsied. ?                       - A Nissen fundoplication was found. The wrap appears  ?                       loose. ?                       - Z-line regular. ?                       -  Esophagogastric landmarks identified. ?                       - Normal mucosa was found in the entire esophagus.  ?                       Dilated. ?                       - The examination was otherwise normal. ?Recommendation:        - Discharge patient to home. ?                       - Resume previous diet. ?                       - Continue present medications. ?                       - Await pathology results. ?                       - Repeat upper endoscopy for surveillance based on  ?                       pathology results. ?                       - Return to GI clinic as previously scheduled. ?                       -  The findings and recommendations were discussed with  ?                       the patient. ?Procedure Code(s):     --- Professional --- ?                       [phone], Esophagogastroduodenoscopy, flexible,  ?                       transoral; with biopsy, single or multiple ?                       43450, Dilation of esophagus, by unguided sound or  ?                       bougie, single or multiple passes ?Diagnosis Code(s):     --- Professional --- ?                       K29.70, Gastritis, unspecified, without bleeding ?                       Z98.890, Other specified postprocedural states ?                       K21.9, Gastro-esophageal reflux disease without  ?                       esophagitis ?                       D50.9, Iron deficiency anemia, unspecified ?  CPT copyright 2019 American [employer] Association. All rights reserved. ?The codes documented in this report are preliminary and upon coder review may  ?be revised to meet current compliance requirements. ?Attending Participation: ?     I personally performed the entire procedure. ?Volney American, DO ?Annamaria Helling DO, DO ?08/14/2021 8:57:03 AM ?This report has been signed electronically. ?Number of Addenda: 0 ?Note Initiated On: 08/14/2021 8:02 AM ?Estimated Blood Loss:  Estimated blood loss was minimal. ?     [facility] ?

## 2021-08-14 NOTE — Interval H&P Note (Signed)
History and Physical Interval Note: Preprocedure H&P from 08/14/21 ? was reviewed and there was no interval change after seeing and examining the patient.  Written consent was obtained from the patient after discussion of risks, benefits, and alternatives. Patient has consented to proceed with Esophagogastroduodenoscopy and Colonoscopy with possible intervention ? ? ?08/14/2021 ?7:59 AM ? ?Brittany [name]  has presented today for surgery, with the diagnosis of IDA.  The various methods of treatment have been discussed with the patient and family. After consideration of risks, benefits and other options for treatment, the patient has consented to  Procedure(s): ?COLONOSCOPY WITH PROPOFOL (N/A) ?ESOPHAGOGASTRODUODENOSCOPY (EGD) WITH PROPOFOL (N/A) as a surgical intervention.  [name] history has been reviewed, patient examined, no change in status, stable for surgery.  I have reviewed the patient's chart and labs.  Questions were answered to the patient's satisfaction.   ? ? ?Annamaria Helling ? ? ?

## 2021-08-15 ENCOUNTER — Encounter: Payer: Self-pay | Admitting: Gastroenterology

## 2021-08-18 LAB — SURGICAL PATHOLOGY

## 2021-08-19 ENCOUNTER — Inpatient Hospital Stay: Payer: Medicare HMO | Attending: Oncology

## 2021-08-19 ENCOUNTER — Encounter: Payer: Self-pay | Admitting: Oncology

## 2021-08-19 DIAGNOSIS — Z79899 Other long term (current) drug therapy: Secondary | ICD-10-CM | POA: Diagnosis not present

## 2021-08-19 DIAGNOSIS — K5790 Diverticulosis of intestine, part unspecified, without perforation or abscess without bleeding: Secondary | ICD-10-CM | POA: Insufficient documentation

## 2021-08-19 DIAGNOSIS — D500 Iron deficiency anemia secondary to blood loss (chronic): Secondary | ICD-10-CM | POA: Diagnosis present

## 2021-08-19 LAB — FERRITIN: Ferritin: 90 ng/mL (ref 11–307)

## 2021-08-19 LAB — IRON AND TIBC
Iron: 98 ug/dL (ref 28–170)
Saturation Ratios: 28 % (ref 10.4–31.8)
TIBC: 350 ug/dL (ref 250–450)
UIBC: 252 ug/dL

## 2021-08-19 LAB — CBC WITH DIFFERENTIAL/PLATELET
Abs Immature Granulocytes: 0.01 10*3/uL (ref 0.00–0.07)
Basophils Absolute: 0.1 10*3/uL (ref 0.0–0.1)
Basophils Relative: 1 %
Eosinophils Absolute: 0.1 10*3/uL (ref 0.0–0.5)
Eosinophils Relative: 1 %
HCT: 38.5 % (ref 36.0–46.0)
Hemoglobin: 11.9 g/dL — ABNORMAL LOW (ref 12.0–15.0)
Immature Granulocytes: 0 %
Lymphocytes Relative: 42 %
Lymphs Abs: 2.3 10*3/uL (ref 0.7–4.0)
MCH: 29.6 pg (ref 26.0–34.0)
MCHC: 30.9 g/dL (ref 30.0–36.0)
MCV: 95.8 fL (ref 80.0–100.0)
Monocytes Absolute: 0.4 10*3/uL (ref 0.1–1.0)
Monocytes Relative: 8 %
Neutro Abs: 2.7 10*3/uL (ref 1.7–7.7)
Neutrophils Relative %: 48 %
Platelets: 304 10*3/uL (ref 150–400)
RBC: 4.02 MIL/uL (ref 3.87–5.11)
RDW: 14.3 % (ref 11.5–15.5)
WBC: 5.5 10*3/uL (ref 4.0–10.5)
nRBC: 0 % (ref 0.0–0.2)

## 2021-08-19 LAB — LACTATE DEHYDROGENASE: LDH: 126 U/L (ref 98–192)

## 2021-08-19 LAB — COMPREHENSIVE METABOLIC PANEL
ALT: 12 U/L (ref 0–44)
AST: 21 U/L (ref 15–41)
Albumin: 3.8 g/dL (ref 3.5–5.0)
Alkaline Phosphatase: 65 U/L (ref 38–126)
Anion gap: 6 (ref 5–15)
BUN: 7 mg/dL — ABNORMAL LOW (ref 8–23)
CO2: 26 mmol/L (ref 22–32)
Calcium: 8.5 mg/dL — ABNORMAL LOW (ref 8.9–10.3)
Chloride: 108 mmol/L (ref 98–111)
Creatinine, Ser: 0.82 mg/dL (ref 0.44–1.00)
GFR, Estimated: >60 mL/min (ref >60)
Glucose, Bld: 93 mg/dL (ref 70–99)
Potassium: 3.6 mmol/L (ref 3.5–5.1)
Sodium: 140 mmol/L (ref 135–145)
Total Bilirubin: 0.5 mg/dL (ref 0.3–1.2)
Total Protein: 7.3 g/dL (ref 6.5–8.1)

## 2021-08-19 LAB — TSH: TSH: 1.18 u[IU]/mL (ref 0.350–4.500)

## 2021-08-20 ENCOUNTER — Encounter: Payer: Self-pay | Admitting: Oncology

## 2021-08-20 ENCOUNTER — Inpatient Hospital Stay: Payer: Medicare HMO | Admitting: Oncology

## 2021-08-20 ENCOUNTER — Inpatient Hospital Stay: Payer: Medicare HMO

## 2021-08-20 VITALS — HR 71 | Temp 96.0°F | Resp 18 | Wt 136.0 lb

## 2021-08-20 DIAGNOSIS — D500 Iron deficiency anemia secondary to blood loss (chronic): Secondary | ICD-10-CM | POA: Diagnosis not present

## 2021-08-20 NOTE — Progress Notes (Signed)
Patient here for oncology follow-up appointment, no new concerns  

## 2021-08-20 NOTE — Progress Notes (Signed)
?Hematology/Oncology Progress note ?[phone] [phone] ?  ? ? ? ?Patient Care Team: ?Derinda Late, MD as PCP - General (Family Medicine) ?Alda Berthold, DO as Consulting Physician (Neurology) ? ?REFERRING PROVIDER: ?Derinda Late, MD  ?CHIEF COMPLAINTS/REASON FOR VISIT:  ?Iron deficiency anemia ? ?HISTORY OF PRESENTING ILLNESS:  ? ?Brittany [name] is a  71 y.o.  female with PMH listed below was seen in consultation at the request of  Derinda Late, MD  for evaluation of anemia ?Patient had blood work done at primary care provider's office. ? ?02/28/2021, hemoglobin 9, MCV 86.4, normal WBC.  Normal platelet count. ?03/07/2021 TIBC 480, iron saturation 9, ferritin 8.  Vitamin B12 566.  TSH 1.855 ?Reviewed patient's previous blood work via care everywhere as well as within our current EMR. ?Anemia is chronic, dated back to 2014. ?Denies any black or bloody stool.  Denies unintentional weight loss, abdominal pain. + Acid reflux  ?08/05/2020, upper endoscopy showed gastritis, partial fundoplication was found.  The wrap appears loose. ?Colonoscopy 10/24/2018 small polyps removed.  Internal hemorrhoids. ?Patient is not able to tolerate oral iron supplementation. ? ?INTERVAL HISTORY ?Brittany [name] is a 71 y.o. female who has above history reviewed by me today presents for follow up visit for management of Iron deficiency anemia ?Patient received IV Venofer treatments and tolerated well.  She reports fatigue improved with IV Venofer treatments.  She has co-pay for each Venofer treatments. ? ? ?Review of Systems  ?Constitutional:  Negative for appetite change, chills, fatigue and fever.  ?HENT:   Negative for hearing loss and voice change.   ?Eyes:  Negative for eye problems.  ?Respiratory:  Negative for chest tightness and cough.   ?Cardiovascular:  Negative for chest pain.  ?Gastrointestinal:  Negative for abdominal distention, abdominal pain and blood in stool.  ?     Acid reflux   ?Endocrine: Negative for hot flashes.  ?Genitourinary:  Negative for difficulty urinating and frequency.   ?Musculoskeletal:  Negative for arthralgias.  ?Skin:  Negative for itching and rash.  ?Neurological:  Negative for extremity weakness.  ?Hematological:  Negative for adenopathy.  ?Psychiatric/Behavioral:  Negative for confusion.   ? ?MEDICAL HISTORY:  ?Past Medical History:  ?Diagnosis Date  ? Allergic genetic state   ? Anemia   ? Anxiety   ? Arthritis   ? Cataract   ? Chicken pox   ? Clostridioides difficile infection 09/05/2018  ? Complication of anesthesia   ? Depression   ? Family history of adverse reaction to anesthesia   ? mother - PONV  ? GERD (gastroesophageal reflux disease)   ? H/O hiatal hernia   ? History of GI bleed   ? Hypercholesteremia   ? Hyperlipidemia   ? Hypothyroidism   ? Osteoporosis   ? PONV (postoperative nausea and vomiting)   ? Stroke [place]) 11/2017  ? Thyroid disease   ? Wears dentures   ? partial upper, full lower  ? ? ?SURGICAL HISTORY: ?Past Surgical History:  ?Procedure Laterality Date  ? ABDOMINAL HYSTERECTOMY  05/18/2000  ? total  ? BRAVO [place] STUDY  05/26/2012  ? Procedure: BRAVO [place];  Surgeon: Lear Ng, MD;  Location: [facility] ENDOSCOPY;  Service: Endoscopy;  Laterality: N/A;  ? CATARACT EXTRACTION    ? CHOLECYSTECTOMY  05/18/2004  ? COLONOSCOPY WITH PROPOFOL N/A 10/24/2018  ? Procedure: COLONOSCOPY WITH PROPOFOL;  Surgeon: Manya Silvas, MD;  Location: [facility] ENDOSCOPY;  Service: Endoscopy;  Laterality: N/A;  ? COLONOSCOPY WITH PROPOFOL  N/A 08/14/2021  ? Procedure: COLONOSCOPY WITH PROPOFOL;  Surgeon: Annamaria Helling, DO;  Location: [facility] ENDOSCOPY;  Service: Gastroenterology;  Laterality: N/A;  ? detatched retina surgery    ? ESOPHAGOGASTRODUODENOSCOPY  05/26/2012  ? Procedure: ESOPHAGOGASTRODUODENOSCOPY (EGD);  Surgeon: Lear Ng, MD;  Location: [facility] ENDOSCOPY;  Service: Endoscopy;  Laterality: N/A;  ? ESOPHAGOGASTRODUODENOSCOPY  05/30/2012  ?  Procedure: ESOPHAGOGASTRODUODENOSCOPY (EGD);  Surgeon: Cleotis Nipper, MD;  Location: [facility] ENDOSCOPY;  Service: Endoscopy;  Laterality: N/A;  ? ESOPHAGOGASTRODUODENOSCOPY N/A 08/31/2012  ? Procedure: ESOPHAGOGASTRODUODENOSCOPY (EGD);  Surgeon: Wonda Horner, MD;  Location: [facility] ENDOSCOPY;  Service: Endoscopy;  Laterality: N/A;  ? ESOPHAGOGASTRODUODENOSCOPY (EGD) WITH PROPOFOL N/A 10/24/2018  ? Procedure: ESOPHAGOGASTRODUODENOSCOPY (EGD) WITH PROPOFOL;  Surgeon: Manya Silvas, MD;  Location: [facility] ENDOSCOPY;  Service: Endoscopy;  Laterality: N/A;  ? ESOPHAGOGASTRODUODENOSCOPY (EGD) WITH PROPOFOL N/A 08/05/2020  ? Procedure: ESOPHAGOGASTRODUODENOSCOPY (EGD) WITH PROPOFOL;  Surgeon: Tol[place], Benay Pike, MD;  Location: [facility] ENDOSCOPY;  Service: Gastroenterology;  Laterality: N/A;  ? ESOPHAGOGASTRODUODENOSCOPY (EGD) WITH PROPOFOL N/A 08/14/2021  ? Procedure: ESOPHAGOGASTRODUODENOSCOPY (EGD) WITH PROPOFOL;  Surgeon: Annamaria Helling, DO;  Location: [facility] ENDOSCOPY;  Service: Gastroenterology;  Laterality: N/A;  ? EYE SURGERY    ? FOOT SURGERY Right   ? HIP SURGERY  1970s  ? "hip sunk in"  ? left eye surgery for detached retina    ? LIVER BIOPSY  2007  ? NECK SURGERY  05/18/2010  ? PLANTAR FASCIA RELEASE Left 10/10/2020  ? Procedure: ENDOSCOPIC PLANTAR FASC. RELEASE;  Surgeon: [name], DPM;  Location: [place];  Service: Podiatry;  Laterality: Left;  ANESTHESIA- CHOICE  ? ROTATOR CUFF REPAIR  03/18/2012  ? right side  ? THYROID SURGERY    ? nodule removed, right side  ? TMJ ARTHROPLASTY  05/18/1990  ? TUBAL LIGATION  05/19/1979  ? ? ?SOCIAL HISTORY: ?Social History  ? ?Socioeconomic History  ? Marital status: Divorced  ?  Spouse name: Not on file  ? Number of children: 2  ? Years of education: Not on file  ? Highest education level: Not on file  ?Occupational History  ? Not on file  ?Tobacco Use  ? Smoking status: Former  ?  Packs/day: 1.50  ?  Years: 20.00  ?  Pack years: 30.00  ?  Types: Cigarettes  ?   Quit date: 02/05/2008  ?  Years since quitting: 13.5  ? Smokeless tobacco: Never  ?Vaping Use  ? Vaping Use: Never used  ?Substance and Sexual Activity  ? Alcohol use: No  ? Drug use: No  ? Sexual activity: Not on file  ?Other Topics Concern  ? Not on file  ?Social History Narrative  ? Left Handed   ? Lives in a townhouse   ? ?Social Determinants of Health  ? ?Financial Resource Strain: Not on file  ?Food Insecurity: Not on file  ?Transportation Needs: Not on file  ?Physical Activity: Not on file  ?Stress: Not on file  ?Social Connections: Not on file  ?Intimate Partner Violence: Not on file  ? ? ?FAMILY HISTORY: ?Family History  ?Problem Relation Age of Onset  ? Hypertension Mother   ? Kidney disease Mother   ? Heart attack Mother   ? Osteoporosis Mother   ? Lung cancer Father   ? Cancer Sister   ? Leukemia Sister   ? Colon polyps Sister   ? Ovarian cancer Paternal Aunt   ? Ovarian cancer Maternal Grandmother   ? ? ?ALLERGIES:  is allergic to lithium, morphine sulfate, fentanyl, morphine, pseudoephedrine, and pseudoephedrine hcl. ? ?MEDICATIONS:  ?Current Outpatient Medications  ?Medication Sig Dispense Refill  ? atorvastatin (LIPITOR) 40 MG tablet Take 40 mg by mouth daily.    ? betamethasone dipropionate 0.05 % cream Apply topically daily.    ? brimonidine (ALPHAGAN) 0.15 % ophthalmic solution SMARTSIG:In Eye(s)    ? Cholecalciferol 50 MCG (2000 UT) TABS Take 2,000 Units by mouth daily.    ? citalopram (CELEXA) 20 MG tablet Take 20 mg by mouth daily.    ? clorazepate (TRANXENE) 3.75 MG tablet Take 7.5 mg by mouth See admin instructions. Take 1 tablet (3.'75mg'$ ) by mouth twice daily - may take 1 additional tablet (3.'75mg'$ ) by mouth at midday if needed    ? levothyroxine (SYNTHROID, LEVOTHROID) 25 MCG tablet Take 25 mcg by mouth every morning.     ? montelukast (SINGULAIR) 10 MG tablet Take 10 mg by mouth at bedtime.    ? pantoprazole (PROTONIX) 40 MG tablet Take 40 mg by mouth daily.    ? pantoprazole (PROTONIX) 40 MG  tablet Take by mouth.    ? timolol (TIMOPTIC) 0.5 % ophthalmic solution SMARTSIG:In Eye(s)    ? traZODone (DESYREL) 50 MG tablet Take 50 mg by mouth at bedtime.    ? vitamin B-12 (CYANOCOBALAMIN) 1000 MCG tablet Take

## 2021-08-22 ENCOUNTER — Encounter: Payer: Self-pay | Admitting: Oncology

## 2021-09-03 MED ORDER — SPIRONOLACTONE 25 MG PO TABS
25 MG | ORAL_TABLET | ORAL | 1 refills | Status: DC
Start: 2021-09-03 — End: 2022-03-20

## 2021-11-05 ENCOUNTER — Encounter: Payer: MEDICARE | Attending: Family Medicine | Primary: Family Medicine

## 2021-12-10 ENCOUNTER — Inpatient Hospital Stay: Admit: 2021-12-10 | Payer: MEDICARE | Primary: Family Medicine

## 2021-12-10 LAB — SENTARA SPECIMEN COLLECTION

## 2021-12-11 LAB — HEMOGLOBIN A1C
AVERAGE GLUCOSE: 120 mg/dL (ref 91–123)
Estimated Avg Glucose, External: 120 mg/dL (ref 91–123)
Hemoglobin A1C, External: 5.8 % — ABNORMAL HIGH (ref 4.8–5.6)
Hemoglobin A1C: 5.8 % — ABNORMAL HIGH (ref 4.8–5.6)

## 2021-12-11 LAB — CBC WITH AUTO DIFFERENTIAL
Basophils Absolute: 0.1 10*3/uL (ref 0.0–0.2)
Basophils: 1 % (ref 0–2)
Eosinophils Absolute: 0.1 10*3/uL (ref 0.0–0.5)
Eosinophils: 2 % (ref 0–6)
Hematocrit: 43.6 % (ref 35.1–48.3)
Hemoglobin: 12.9 g/dL (ref 11.7–16.1)
Lymphocytes Absolute: 1.8 10*3/uL (ref 1.0–4.8)
Lymphocytes: 31 % (ref 20–45)
MCH: 27 pg (ref 26–34)
MCHC: 30 g/dL — ABNORMAL LOW (ref 31–36)
MCV: 92 fL (ref 80–99)
MPV: 12.9 fL (ref 9.0–13.0)
Monocytes Absolute: 0.4 10*3/uL (ref 0.1–1.0)
Monocytes: 6 % (ref 3–12)
Neutrophils Absolute: 3.5 10*3/uL (ref 1.8–7.7)
Neutrophils: 60 % (ref 40–75)
Platelets: 143 10*3/uL (ref 140–440)
RBC: 4.74 M/uL (ref 3.80–5.20)
RDW: 14.4 % (ref 10.0–15.5)
WBC: 5.9 10*3/uL (ref 4.0–11.0)

## 2021-12-11 LAB — IRON AND TIBC
Iron % Saturation: 18 % — ABNORMAL LOW (ref 20–50)
Iron: 69 ug/dL (ref 30–160)
TIBC: 376 ug/dL (ref 228–428)
UIBC: 307 ug/dL (ref 110–370)

## 2021-12-11 LAB — FERRITIN: Ferritin: 67 ng/mL (ref 10–291)

## 2021-12-11 LAB — COMPREHENSIVE METABOLIC PANEL
ALT: 14 U/L (ref 5–40)
AST: 12 U/L (ref 10–37)
Albumin/Globulin Ratio: 2.4 ratio (ref 1.1–2.6)
Albumin: 4.3 g/dL (ref 3.5–5.0)
Alkaline Phosphatase: 86 U/L (ref 40–120)
Anion Gap: 11 mmol/L (ref 3.0–15.0)
BUN: 12 mg/dL (ref 6–22)
CO2: 26 mmol/L (ref 20–32)
Calcium: 9.1 mg/dL (ref 8.4–10.5)
Chloride: 107 mmol/L (ref 98–110)
Creatinine: 0.6 mg/dL — ABNORMAL LOW (ref 0.8–1.4)
Globulin: 1.8 g/dL — ABNORMAL LOW (ref 2.0–4.0)
Glomerular Filtration Rate: >60 mL/min/{1.73_m2} (ref >60.0)
Glucose: 107 mg/dL — ABNORMAL HIGH (ref 70–99)
Potassium: 3.7 mmol/L (ref 3.5–5.5)
Sodium: 144 mmol/L (ref 133–145)
Total Bilirubin: 0.6 mg/dL (ref 0.2–1.2)
Total Protein: 6.1 g/dL — ABNORMAL LOW (ref 6.2–8.1)

## 2021-12-11 LAB — LIPID PANEL
Chol/HDL Ratio: 2.7 (ref 0.0–5.0)
Cholesterol: 166 mg/dL (ref 110–200)
HDL: 61 mg/dL (ref >=40)
LDL Calculated: 87 mg/dL (ref 50–99)
LDL/HDL Ratio: 1.4
Non-HDL Cholesterol: 105 mg/dL (ref <130)
Triglycerides: 89 mg/dL (ref 40–149)
VLDL Cholesterol Calculated: 18 mg/dL (ref 8–30)

## 2021-12-11 LAB — VITAMIN B12: Vitamin B-12: 894 pg/mL (ref 211–911)

## 2021-12-11 LAB — VITAMIN D 25 HYDROXY: Vit D, 25-Hydroxy: 35.9 ng/mL (ref 32.0–100.0)

## 2021-12-11 LAB — FOLATE: Folate: 7 ng/mL (ref >=3.10)

## 2021-12-11 LAB — MICROALBUMIN / CREATININE URINE RATIO
Creatinine, Ur: 115 mg/dL
Microalb, Ur: <12 mg/L (ref 0.1–17.0)

## 2021-12-14 LAB — VITAMIN B1, WHOLE BLOOD: Thiamine: 165 nmol/L (ref 78–185)

## 2021-12-16 ENCOUNTER — Ambulatory Visit: Admit: 2021-12-16 | Discharge: 2021-12-16 | Payer: MEDICARE | Attending: Family Medicine | Primary: Family Medicine

## 2021-12-16 DIAGNOSIS — M25562 Pain in left knee: Secondary | ICD-10-CM

## 2021-12-16 DIAGNOSIS — I10 Essential (primary) hypertension: Secondary | ICD-10-CM

## 2021-12-16 NOTE — Progress Notes (Unsigned)
1. "Have you been to the ER, urgent care clinic since your last visit?  Hospitalized since your last visit?" No    2. "Have you seen or consulted any other health care providers outside of the [facility] since your last visit?" No     3. For patients aged 70-75: Has the patient had a colonoscopy / FIT/ Cologuard? Yes - no Care Gap present      If the patient is female:    4. For patients aged 40-74: Has the patient had a mammogram within the past 2 years? Yes - no Care Gap present      5. For patients aged 21-65: Has the patient had a pap smear? NA - based on age or sex

## 2021-12-16 NOTE — Progress Notes (Signed)
SUBJECTIVE  Ff-up    HTN- currently on aldactone  Hypokalemia- endo eval neg for hyperaldo, currently on aldactone and kcl 40 meqs TID with normal K levels.     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   However since procedure has had loose stools after meals, has consulted with nutritionist/bariatric center without improvement, interested in seeing GI at this point.     DM-diet controlled, she does not check glucose at home. She is no longer on metformin.      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 Says weight loss improved respirotary symptoms. denies cough, sob, wheezing. She says last albuterol use was a year ago. Usually heat triggers respiratory symptoms.    Left knee pain- on and off past year, has had knee replacement with dr. Meyer Cory 10/2020 without knon complications, tripped over rug in June and fell landing on L knee with increase in pain. No bruising, does not need to take anything for pain.    ROS:  See HPI, all others negative        Patient Active Problem List   Diagnosis Code    Chronic infection of sinus J32.9    Type 2 diabetes mellitus without complication (HCC) E11.9    Incontinence in female R32    Candidal intertrigo B37.2    Arthritis, multiple joint involvement M12.9    Essential hypertension I10    COPD (chronic obstructive pulmonary disease) (HCC) J44.9    Hyperlipidemia E78.5    Vitamin D deficiency E55.9    Internal hemorrhoids K64.8    Colon polyps K63.5    Hypokalemia E87.6    Hypomagnesemia E83.42    Syncope R55    Elevated troponin R77.8    Short gut syndrome K91.2    Hypocalcemia E83.51    Mild intermittent asthma J45.20    Thrombocytopenia, unspecified D69.6    Arthritis of knee M17.10    Non-compliance Z91.199    Arthrofibrosis of knee joint, 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         Current Outpatient Medications:     spironolactone (ALDACTONE) 25 MG tablet, TAKE 1  TABLET EVERY DAY, Disp: 90 tablet, Rfl: 1    aspirin 81 MG EC tablet, Take 1 tablet by mouth 2 times daily, Disp: , Rfl:     calcium citrate (CALCITRATE) 950 (200 Ca) MG tablet, Take 1 tablet by mouth daily, Disp: , Rfl:     cyanocobalamin 1000 MCG tablet, Take 1 tablet by mouth daily, Disp: , Rfl:     ergocalciferol (ERGOCALCIFEROL) 1.25 MG (50000 UT) capsule, Take 1 capsule by mouth every 7 days, Disp: , Rfl:     magnesium oxide (MAG-OX) 400 MG tablet, Take 1 tablet by mouth daily, Disp: , Rfl:     nystatin (MYCOSTATIN) 100000 UNIT/GM cream, Apply topically 2 times daily, Disp: , Rfl:     potassium chloride (KLOR-CON M) 20 MEQ extended release tablet, Take 2 tablets by mouth 3 times daily, Disp: , Rfl:     thiamine 100 MG tablet, Take 1 tablet by mouth daily, Disp: , Rfl:     clindamycin (CLEOCIN) 300 MG capsule, 3 po one hour prior to dental work. (Patient not taking: Reported on 12/16/2021), Disp: , Rfl:         Allergies   Allergen Reactions    Latex Rash    Acetaminophen-Codeine Nausea Only and Other (comments)     Nausea and pains in stomach  Codeine Other (comments)     Nausea and abd pain    Butrans [Buprenorphine] Rash    Iodine Rash    Lactose Diarrhea     Lactose intolerance    Other Plant, Educational psychologist, Environmental Hives     Conductive electrodes    Penicillins Itching       Past Medical History:   Diagnosis Date    Arthritis     knee, arms,     Asthma     resolved after gastric bypass    Chronic obstructive pulmonary disease (HCC)     no meds, since gastric bypass    Chronic pain     Diabetes (HCC)     no meds    H/O sinusitis     Hx SBO     multiple surgeries prior to 2002    Hypercholesterolemia     Hypertension     Sleep apnea     NOT USING     Stress incontinence     Thrombocytopenia (HCC)     Toe fracture, left August 2015    4th toe       Social History     Socioeconomic History    Marital status: SINGLE     Spouse name: Not on file    Number of children: Not on file    Years of education: Not on  file    Highest education level: Not on file   Occupational History    Not on file   Tobacco Use    Smoking status: Never    Smokeless tobacco: Never   Vaping Use    Vaping Use: Never used   Substance and Sexual Activity    Alcohol use: No     Alcohol/week: 0.0 standard drinks    Drug use: No    Sexual activity: Not Currently     Partners: Male   Other Topics Concern    Not on file   Social History Narrative    Retired Psychologist, occupational, 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 Denies history of smoking      Social Determinants of Psychologist, prison and probation services Strain: Low Risk     Difficulty of Paying Living Expenses: Not very hard   Food Insecurity: No Food Insecurity    Worried About Programme researcher, broadcasting/film/video in the Last Year: Never true    Ran Out of Food in the Last Year: Never true   Transportation Needs: Not on file   Physical Activity: Not on file   Stress: Not on file   Social Connections: Not on file   Intimate Partner Violence: Not on file   Housing Stability: Not on file       Family History   Problem Relation Age of Onset    Hypertension Mother     Stroke Mother     Diabetes Mother     Thyroid Disease Mother     Arthritis-rheumatoid Mother     Cancer Father         colon polpe    Cancer Maternal Aunt          OBJECTIVE    Physical Exam:     BP 132/76 (Site: Left Upper Arm, Position: Sitting, Cuff Size: Medium Adult)   Pulse 70   Temp 98.4 F (36.9 C) (Temporal)   Resp 16   Ht 5\' 3"  (1.6 m)   Wt 202 lb (91.6 kg)   SpO2 98%   BMI 35.78 kg/m  General: alert, well-appearing,obese,AA, in no apparent distress or pain  Neck: supple, no adenopathy palpated  CVS: normal rate, regular rhythm, distinct S1 and S2  Lungs:clear to ausculation bilaterally, no crackles, wheezing or rhonchi noted  Abdomen: normoactive bowel sounds, soft, non-tender  Extremities:L knee no tenderness, mild effusion, FROM  Skin: warm, no lesions, rashes noted  Psych:  mood and affect normal  CMP:   Lab Results   Component Value  Date/Time    NA 144 12/10/2021 10:40 AM    K 3.7 12/10/2021 10:40 AM    CL 107 12/10/2021 10:40 AM    CO2 26 12/10/2021 10:40 AM    BUN 12 12/10/2021 10:40 AM    CREATININE 0.6 12/10/2021 10:40 AM    GLUCOSE 107 12/10/2021 10:40 AM    CALCIUM 9.1 12/10/2021 10:40 AM    PROT 6.1 12/10/2021 10:40 AM    LABALBU 4.3 12/10/2021 10:40 AM    BILITOT 0.6 12/10/2021 10:40 AM    AST 12 12/10/2021 10:40 AM    ALT 14 12/10/2021 10:40 AM        CBC:   Lab Results   Component Value Date/Time    WBC 5.9 12/10/2021 10:40 AM    WBC 6.3 01/13/2021 09:04 AM    RBC 4.74 12/10/2021 10:40 AM    HGB 12.9 12/10/2021 10:40 AM    HCT 43.6 12/10/2021 10:40 AM    MCV 92 12/10/2021 10:40 AM    MCH 27 12/10/2021 10:40 AM    MCHC 30 12/10/2021 10:40 AM    RDW 14.4 12/10/2021 10:40 AM    PLT 143 12/10/2021 10:40 AM    MPV 12.9 12/10/2021 10:40 AM        Lipids   Lab Results   Component Value Date/Time    CHOL 166 12/10/2021 10:40 AM    CHOL 160 09/03/2020 07:47 AM    TRIG 89 12/10/2021 10:40 AM    LDLCALC 87 12/10/2021 10:40 AM    LABVLDL 18 12/10/2021 10:40 AM    CHOLHDLRATIO 2.7 12/10/2021 10:40 AM         Imaging results last 24 hrs :MAM TOMO DIGITAL SCREEN BILATERAL    Result Date: 07/31/2021  BIRADS: 1-NEGATIVE BREAST DENSITY: C-The breasts are heterogeneously dense, which may obscure small masses. SCREENING MAMMOGRAM BILATERAL  3D tomosynthesis HISTORY: Screening. COMPARISON: 22 and other priors FINDINGS: 2D and 3D images were performed. Digital mammograms were performed. No suspicious microcalcifications, masses, or areas of architectural distortion. Interpretation performed in conjunction with computed assisted detection system.     No evidence of malignancy.  Suggest routine follow-up.      Imaging results impression onlyMAM TOMO DIGITAL SCREEN BILATERAL    Result Date: 07/31/2021  No evidence of malignancy.  Suggest routine follow-up.     XR KNEE LEFT (3 VIEWS)    (Results Pending)       A1c:   Hemoglobin A1C   Date Value Ref Range Status    12/10/2021 5.8 (H) 4.8 - 5.6 % Final   04/18/2021 5.7 (H) 4.2 - 5.6 % Final     Comment:     (NOTE)  HbA1C Interpretive Ranges  <5.7              Normal  5.7 - 6.4         Consider Prediabetes  >6.5              Consider Diabetes     12/20/2020 5.7 (H) 4.2 - 5.6 % Final  Comment:     (NOTE)  HbA1C Interpretive Ranges  <5.7              Normal  5.7 - 6.4         Consider Prediabetes  >6.5              Consider Diabetes         ASSESSMENT/PLAN  Diagnoses and all orders for this visit:     Essential hypertension  controlled w/ hypoKalemia issues  cont aldactone 25 mg  Cont kcl 20 meqs TID  DASH diet, BP Log  CMP prior to next visit     Diabetes mellitus type 2, diet-controlled (HCC)  Off meds after gastric bypass 10/19  Monitoring   Check cmp/a1cto next visit     Mild intermittent asthma, unspecified whether complicated  Controlled  Cont prn albuterol      Pure hypercholesterolemia   off statin after wt loss/gastric bypass 2019  Monitoring      BMI 35  S/p gastric bypass    Hypokalemia  See #1    Vitamin D deficiency   s/p gastric bypass  Cont 50 k weekly  Monitoring    S/p gastric bypass  W/ dumping syndrome/symptoms of dehydration/hypoglycemia erratic  Advised increase hydration, fiber,  protein on each meal    Thrombocytopenia, unspecified (HCC)  Mild, monitoring    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 Meyer Cory s/p arthroplasty 10/2021    Ff-up in 3 months or sooner prnm fasting labs prior          Patient understands plan of care. Patient has provided input and agrees with goals.

## 2022-01-12 ENCOUNTER — Inpatient Hospital Stay: Admit: 2022-01-12 | Payer: MEDICARE | Primary: Family Medicine

## 2022-01-12 DIAGNOSIS — M25562 Pain in left knee: Secondary | ICD-10-CM

## 2022-01-12 DIAGNOSIS — G8929 Other chronic pain: Secondary | ICD-10-CM

## 2022-01-15 NOTE — Progress Notes (Signed)
Follow-up Visit   Date: 01/16/2022    Brittany [name] MRN: [id number] DOB: [birth year]    Brittany [name] is a 71 y.o. Caucasian female with iron deficient anemia, anxiety/depression, GERD, hyperlipidemia, hypertension, and hypothyroidism returning to the clinic for follow-up of small fiber neuropathy.  The patient was accompanied to the clinic by self.  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  GoodRx card provided to see if it will be more affordable - Check CRP, vitamin B12, folate, SPEP with IFE, copper Meningioma  Return to clinic in 6 months   --------------------------------------------- History of present illness: Starting around August 2022, she began having numbness/tingling involving the feet and lower legs. She also complains of pain in the knees and hands, which sounds more arthritic in nature. No numbness/tingling in the hands or knees.  She has been seeing Dr. Melrose Nakayama at [facility] since 2019 and was evaluated for these symptoms in September. Because of concern of plantar fasciitis, she was offered diclofenac and trial of Lyrica.  Her insurance, however, did not cover Lyrica. She is here for second opinion for her feet pain.  She also has a known right frontal parafalcine meningioma and previously seen seen neurosurgery, Dr. Deetta Perla, who recommended follow-up imaging in 2 year. She feels that it is causing new headaches and deformity of her skull.  She lives alone.  Nonsmoker, does not drink alcohol.   UPDATE 01/16/2022: She underwent skin biopsy with podiatry which shows small fiber neuropathy.  She is here to discusss this.  She continues to have painful tingling involving the feet, which is worse in the evening.    Medications:   Current Outpatient Medications on File Prior to Visit  Medication Sig Dispense Refill   atorvastatin (LIPITOR) 40 MG tablet Take 40 mg by mouth daily.     betamethasone dipropionate 0.05 % cream Apply topically daily.     brimonidine (ALPHAGAN) 0.15 % ophthalmic solution SMARTSIG:In Eye(s)     Cholecalciferol 50 MCG (2000 UT) TABS Take 2,000 Units by mouth daily.     citalopram (CELEXA) 20 MG tablet Take 20 mg by mouth daily.     clorazepate (TRANXENE) 3.75 MG tablet Take 7.5 mg by mouth See admin instructions. Take 1 tablet (3.'75mg'$ ) by mouth twice daily - may take 1 additional tablet (3.'75mg'$ ) by mouth at midday if needed     levothyroxine (SYNTHROID, LEVOTHROID) 25 MCG tablet Take 25 mcg by mouth every morning.      montelukast (SINGULAIR) 10 MG tablet Take 10 mg by mouth at bedtime.     pantoprazole (PROTONIX) 40 MG tablet Take 40 mg by mouth daily.     timolol (TIMOPTIC) 0.5 % ophthalmic solution SMARTSIG:In Eye(s)     traZODone (DESYREL) 50 MG tablet Take 50 mg by mouth at bedtime.     vitamin B-12 (CYANOCOBALAMIN) 1000 MCG tablet Take 1,000 mcg by mouth daily.     gabapentin (NEURONTIN) 300 MG capsule Take 1 capsule (300 mg total) by mouth at bedtime. (Patient not taking: Reported on 01/16/2022) 30 capsule 3   pantoprazole (PROTONIX) 40 MG tablet Take by mouth. (Patient not taking: Reported on 01/16/2022)     No current facility-administered medications on file prior to visit.    Allergies:  Allergies  Allergen Reactions   Lithium Anaphylaxis  Other reaction(s): anaphylaxis   Morphine Sulfate     Other reaction(s): nausea and vomiting   Fentanyl Other (See Comments)    Extreme Sedation Other reaction(s): Lethargy (intolerance) Extreme sedation  Other reaction(s): extreme sedation   Morphine Nausea And Vomiting and Nausea Only    Other reaction(s): Other (See Comments)   Pseudoephedrine Hypertension    Other reaction(s): Hypertension (intolerance), Other (See Comments), Other  (See Comments) Hypertension Other reaction(s): Hypertension (intolerance)    Pseudoephedrine Hcl Nausea Only    Other reaction(s): hypertension, Other (See Comments)    Vital Signs:  BP (!) 105/59   Pulse 75   Ht '5\' 5"'$  (1.651 m)   Wt 140 lb (63.5 kg)   SpO2 96%   BMI 23.30 kg/m    Neurological Exam: MENTAL STATUS including orientation to time, place, person, recent and remote memory, attention span and concentration, language, and fund of knowledge is normal.  Speech is not dysarthric.  CRANIAL NERVES:    Normal conjugate, extra-ocular eye movements in all directions of gaze.  No ptosis .  Face is symmetric.  MOTOR:  Motor strength is 5/5 in all extremities, including distally  No atrophy, fasciculations or abnormal movements.  No pronator drift.  Tone is normal.    MSRs:  Reflexes are 2+/4 in the arms and absent in the legs  SENSORY:  Intact to vibration in the hands and knees, reduced below the ankles.  COORDINATION/GAIT:  Normal finger-to- nose-finger.  Intact rapid alternating movements bilaterally.  Gait narrow based and stable.   Data: Labs 03/07/2021:  B12 566, TSH 1.85 Skin biopsy 01/05/2022:  Severely reduced epidermal nerve fiber density consistent with small fiber neuropathy  MRI brain 06/08/2021: 1. No acute intracranial pathology. 2. 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 No mass effect on the underlying brain parenchyma. 3. Mild global parenchymal volume loss and chronic white matter microangiopathy.   05/19/2020  MRI BRAIN WO CONTRAST  IMPRESSION:  1.  No acute intracranial pathology identified.  2.  Right anterior frontal lobe parafalcine meningioma measures 12 x 3 x 11 mm in size.  3.  Mild chronic small vessel ischemic change.   02/02/2018 EMG IMPRESSION: Abnormal study. There is electrodiagnostic evidence of mild bilateral carpal tunnel syndrome and mild right ulnar mononeuropathy.   01/13/2018 EEG IMPRESSION: This routine EEG in the awake and asleep  states is within normal limits.     11/27/17 MR BRAIN WO CONTRAST IMPRESSION: No intracranial abnormality. Low-density in the anterior limb internal capsule on the left described at CT is secondary to dilated perivascular spaces, a normal variant.  Right-sided paranasal sinusitis   11/27/17 11/27/17 CT ANGIO NECK W WO CONTRAST IMPRESSION: Negative CT perfusion. Atherosclerotic disease at both carotid bifurcations. 25% stenosis of the proximal ICA on the right. No stenosis on the left. No intracranial finding of significance. Tiny low-density in the anterior limb internal capsule on the left remains indeterminate. This could be an old small vessel insult but I cannot rule out the possibility of a recent small vessel infarction, too small to show up on the perfusion exam.   11/27/17 CT HEAD CODE STROKE WO CONTRAST IMPRESSION: 1. Small area of low-density in the anterior limb internal capsule on the left favored to be old. No high suspicion acute finding. Generalized brain atrophy. 2. ASPECTS is 9.   09/01/12 MR BRAIN WO CONTRAST IMPRESSION: No significant intracranial abnormality   Thank you for allowing me to participate in patient's care.  If I can answer any  additional questions, I would be pleased to do so.    Sincerely,    Jaret Coppedge K. Posey Pronto, DO

## 2022-01-16 ENCOUNTER — Ambulatory Visit: Payer: Medicare HMO | Admitting: Neurology

## 2022-01-16 ENCOUNTER — Telehealth (HOSPITAL_COMMUNITY): Payer: Self-pay | Admitting: Pharmacy Technician

## 2022-01-16 ENCOUNTER — Other Ambulatory Visit (INDEPENDENT_AMBULATORY_CARE_PROVIDER_SITE_OTHER): Payer: Medicare HMO

## 2022-01-16 ENCOUNTER — Encounter: Payer: Self-pay | Admitting: Neurology

## 2022-01-16 VITALS — BP 105/59 | HR 75 | Ht 65.0 in | Wt 140.0 lb

## 2022-01-16 DIAGNOSIS — G629 Polyneuropathy, unspecified: Secondary | ICD-10-CM

## 2022-01-16 LAB — C-REACTIVE PROTEIN: CRP: <1 mg/dL (ref 0.5–20.0)

## 2022-01-16 LAB — B12 AND FOLATE PANEL
Folate: >23.9 ng/mL (ref >5.9)
Vitamin B-12: 762 pg/mL (ref 211–911)

## 2022-01-16 MED ORDER — PREGABALIN 50 MG PO CAPS: 50.0000 mg | ORAL_CAPSULE | Freq: Every day | ORAL | 5 refills | Status: DC

## 2022-01-16 NOTE — Telephone Encounter (Signed)
Patient Advocate Encounter   Received notification that prior authorization for Pregabalin '50MG'$  capsules is required.   PA submitted on 01/16/2022 Key [id number] Status is pending       Lyndel Safe, [place] Patient Advocate Specialist [place] Patient Advocate Team Direct Number: [phone]  Fax: [phone]

## 2022-01-16 NOTE — Telephone Encounter (Signed)
Patient Advocate Encounter  Prior Authorization for Pregabalin '50MG'$  capsules has been approved.    PA# [id number] Effective dates: 01/16/2022 through 05/17/2022      Lyndel Safe, [place] Patient Advocate Specialist [place] Patient Advocate Team Direct Number: [phone]  Fax: [phone]

## 2022-01-22 ENCOUNTER — Ambulatory Visit: Admit: 2022-01-22 | Discharge: 2022-01-22 | Payer: MEDICARE | Attending: Specialist | Primary: Family Medicine

## 2022-01-22 DIAGNOSIS — Z96652 Presence of left artificial knee joint: Secondary | ICD-10-CM

## 2022-01-22 NOTE — Telephone Encounter (Signed)
Order faxed to number provided.

## 2022-01-22 NOTE — Progress Notes (Signed)
Cheryl Paul presents today for   Chief Complaint   Patient presents with    Knee Pain     Left worse than right,   [facility] - Dr. Meyer Cory     Hip Pain     Left hip          Cheryl Paul preferred language for health care discussion is english/other.    Is someone accompanying this pt? No    Is the patient using any DME equipment during OV? Yes, four point walker     Depression Screening:  Depression: Not at risk    PHQ-2 Score: 0         Learning Assessment:  No question data found.    Fall Risk  Amb Fall Risk Assessment and TUG Test 07/31/2021   Do you feel unsteady or are you worried about falling?  yes   2 or more falls in past year? no   Fall with injury in past year? no   Fall in past 12 months? -   Able to walk? -   Total Score -         Pt currently taking Anticoagulant therapy? No    Coordination of Care:  1. Have you been to the ER, urgent care clinic since your last visit? Hospitalized since your last visit? Yes, xrays    2. Have you seen or consulted any other health care providers outside of the [facility] since your last visit? Include any pap smears or colon screening. N/a

## 2022-01-22 NOTE — Telephone Encounter (Signed)
Patient called and states that [facility] needs the order for the patient Rolator faxed over         [facility] Phone   [phone]        Fax  [phone]

## 2022-01-22 NOTE — Progress Notes (Signed)
Patient: Cheryl Paul                MRN: [id number]       SSN: [id number]  Date of Birth: Oct 16, 1950        AGE: 71 y.o.        SEX: female      PCP: Mable Fill, MD  01/22/22    Chief Complaint   Patient presents with    Knee Pain     Left worse than right,   [facility] - Dr. Meyer Cory     Hip Pain     Left hip       Leg Pain     Bilateral calf pain       HISTORY:  Cheryl Paul is a 71 y.o. female who is seen for continiued left knee pain and stiffness. She is s/p left knee arthroplasty on 10/28/20 by Dr. Meyer Cory.  She underwent left knee manipulation on 01/14/2021. She has been elevating her legs with some benefit. She states that she is allergic to Tylenol #3.     Patient was last seen on 11/11/2018 for right hip and knee pain. She has been experiencing right  hip pain for the past several months. She states that she fell many years ago. She states that she aches when sleeping. She says she has knots in her right hip.She feels pain in her lateral hip with standing and walking. Her hip  stiffens up after she  sits for long periods of time.       She is also seen for rightl knee pain. She has been experiencing worsening pain for the past few years. She notes that her R knee pain has decreased  since bariatric surgery. She states previous x-rays of her knees showed severe osteoarthritis.  She states that her knees pop like popcorn.  She was previously seen at [facility] by Dr. Hollice Espy about two years ago.  She states she had previous  cortisone injections.  She has difficulty standing and sitting.  She previously saw a neurosurgereon who discussed doing an laminectomy.  She has posterior burning sensations in her knees.       She was previously seen for bilateral shoulder pain. She has shoulder pain radiating into her upper arms.       She underwent bariatric surgery on 02/22/18 by Dr. Karleen Hampshire. She was 285# before surgery and now weighs 202#.     She sees Dr. Nelva Bush for pain management. She was  previously seen by Dr. Paulina Fusi but is no longer able to be seen at [facility] since she switched insurances.     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   She has two great grandchildren ages 37 and 2.  She has 5 grandchildren. HGA1C 10.4 as of 04/06/16. She was doing aqua therapy at the [facility] but had to stop due to the Coronavirus shutdown. She states that her PT in [place] increased her knee pain.  She weighs herself every Friday. Current weight and height are 192 lbs and 5'3".   Wt Readings from Last 3 Encounters:   12/16/21 202 lb (91.6 kg)   07/31/21 190 lb (86.2 kg)   04/23/21 188 lb (85.3 kg)      There is no height or weight on file to calculate BMI.    Patient  Active Problem List   Diagnosis    Essential hypertension    Colon polyps    Syncope    Hypokalemia    Hyperlipidemia    Incontinence in female    Vitamin D deficiency    Type 2 diabetes mellitus without complication (HCC)    Internal hemorrhoids    Elevated troponin    Mild intermittent asthma    Candidal intertrigo    Arthritis, multiple joint involvement    Hypomagnesemia    Short gut syndrome    Hypocalcemia    Chronic infection of sinus    Thrombocytopenia, unspecified (HCC)    Arthritis of knee    Non-compliance    Arthrofibrosis of knee joint, unspecified laterality       Social History     Tobacco Use    Smoking status: Never    Smokeless tobacco: Never   Vaping Use    Vaping Use: Never used   Substance Use Topics    Alcohol use: Never    Drug use: Never        Allergies   Allergen Reactions    Latex Rash    Acetaminophen-Codeine Nausea Only and Other (See Comments)     Nausea and pains in stomach    Codeine Other (See Comments)     Nausea and abd pain    Buprenorphine Rash    Iodine Rash    Lactose Diarrhea     Lactose intolerance    Penicillins Itching    Seasonal Hives     Conductive electrodes         Current Outpatient Medications   Medication Sig    spironolactone (ALDACTONE) 25 MG tablet TAKE 1 TABLET EVERY DAY    aspirin 81 MG EC tablet Take 1 tablet by mouth 2 times daily    calcium citrate (CALCITRATE) 950 (200 Ca) MG tablet Take 1 tablet by mouth daily    clindamycin (CLEOCIN) 300 MG capsule 3 po one hour prior to dental work. (Patient not taking: Reported on 12/16/2021)    cyanocobalamin 1000 MCG tablet Take 1 tablet by mouth daily    ergocalciferol (ERGOCALCIFEROL) 1.25 MG (50000 UT) capsule Take 1 capsule by mouth every 7 days    magnesium oxide (MAG-OX) 400 MG tablet Take 1 tablet by mouth daily    nystatin (MYCOSTATIN) 100000 UNIT/GM cream Apply topically 2 times daily    potassium chloride (KLOR-CON M) 20 MEQ extended release tablet Take 2 tablets by mouth 3 times daily    thiamine 100 MG tablet Take 1 tablet by mouth daily     No current facility-administered medications for this visit.        PHYSICAL EXAMINATION:  There were no vitals taken for this visit.     ORTHO EXAMINATION:       Examination  Left knee  Right knee         Skin  Intact  Intact         Range of motion  95-5 75-25     Effusion  - -     Medial joint line tenderness  +  +     Lateral joint line tenderness  -  -     Popliteal tenderness  -  -     Osteophytes palpable  +, medial   +, medial     McMurray's  -  -     Patella crepitus  +  +     Anterior drawer  -  -  Lateral laxity  -  -     Medial laxity  -  -     Varus deformity  +  +     Valgus deformity  -  -     Pretibial edema  2+  2+         Calf tenderness  -  -            Examination  Right shoulder  Left shoulder     Skin  Intact  Intact     Effusion  -  -     Biceps deformity  -  -     Atrophy  -  -     AC joint tenderness  -  -     Acromial tenderness  +  +     Biceps tenderness  -  -     Forward flexion/Elevation ROM  175  175     Active abduction ROM  160  160     External rotation ROM  90  90     Internal rotation ROM  70  70     Apprehension  -  -      Impingement  -  -     Drop Arm Test  -  -         Neurovascular  Intact  Intact     RADIOGRAPHS:    01/12/22 XR     IMPRESSION:      ICD-10-CM    1. Presence of left artificial knee joint  Z96.652 Amb External Referral To Pain Medicine     DME Order for [facility]) as OP      2. Other specified postprocedural states  Z98.890 Amb External Referral To Pain Medicine     DME Order for [facility]) as OP      3. Other acute postprocedural pain  G89.18 Amb External Referral To Pain Medicine     DME Order for [facility]) as OP          PLAN:  Referral to Pain Management. DME order placed for a Rollator walker. Advised pt to start independent aqua-therapy at the local [facility]. Follow up in one month with Dr. Meyer Cory.    Tonia Brooms, MD

## 2022-01-23 LAB — IMMUNOFIXATION ELECTROPHORESIS
IgG (Immunoglobin G), Serum: 1370 mg/dL (ref 600–1540)
IgM, Serum: 155 mg/dL (ref 50–300)
Immunofix Electr Int: NOT DETECTED
Immunoglobulin A: 280 mg/dL (ref 70–320)

## 2022-01-23 LAB — PROTEIN ELECTROPHORESIS, SERUM
Albumin ELP: 4 g/dL (ref 3.8–4.8)
Alpha 1: 0.2 g/dL (ref 0.2–0.3)
Alpha 2: 0.7 g/dL (ref 0.5–0.9)
Beta 2: 0.4 g/dL (ref 0.2–0.5)
Beta Globulin: 0.4 g/dL (ref 0.4–0.6)
Gamma Globulin: 1.3 g/dL (ref 0.8–1.7)
Total Protein: 7.2 g/dL (ref 6.1–8.1)

## 2022-01-23 LAB — COPPER, SERUM: Copper: 80 ug/dL (ref 70–175)

## 2022-01-27 NOTE — Telephone Encounter (Signed)
Order re-printed and faxed to [phone]

## 2022-01-27 NOTE — Telephone Encounter (Signed)
Patient stated that her order for Rolator needs to be sent to [facility] at fax [phone].

## 2022-01-27 NOTE — Telephone Encounter (Signed)
Pt called to follow up on gastro referral. Please advise.

## 2022-01-29 NOTE — Telephone Encounter (Signed)
Patient called again and said that [facility] did not receive the order for the Rolator Walker from Dr.Blasdell.     Patient is asking that the order be re -faxed to this New Fax Number.    [facility]  Fax # [phone]    Patient tel. [phone].    Note: please see previous messages.

## 2022-01-29 NOTE — Telephone Encounter (Signed)
Patient notified I refaxed her order.  The number this time was different from the number we had before.

## 2022-02-02 NOTE — Telephone Encounter (Signed)
Pt called stating she had an episode on Saturday during bingo where she became comatose. Pt states she was playing the game when she went to reach for the dabber with her right hand and it cramped up on her. Pt states her vision went blurry and she could not move. Pt states she believes the episode lasted about and hour and when she stood up, she had urinated and pooped on herself while sitting there. Pt states she was told by friends she had her head down with no movement, but no one checked on her. Pt vaguely remembers what happened but states she feels a little better today. Pt wanted to let Dr. Kathi Ludwig know and see if she wanted her to be seen. Please advise.

## 2022-02-03 NOTE — Telephone Encounter (Signed)
Spoke with patient and she has been scheduled for 02/04/2022

## 2022-02-04 ENCOUNTER — Inpatient Hospital Stay: Admit: 2022-02-04 | Discharge: 2022-02-09 | Payer: MEDICARE | Primary: Family Medicine

## 2022-02-04 ENCOUNTER — Ambulatory Visit: Admit: 2022-02-04 | Discharge: 2022-02-04 | Payer: MEDICARE | Attending: Family Medicine | Primary: Family Medicine

## 2022-02-04 ENCOUNTER — Encounter: Payer: Self-pay | Admitting: Neurology

## 2022-02-04 DIAGNOSIS — R55 Syncope and collapse: Secondary | ICD-10-CM

## 2022-02-04 LAB — EKG 12-LEAD
Atrial Rate: 69 {beats}/min
P Axis: 47 degrees
P-R Interval: 160 ms
Q-T Interval: 392 ms
QRS Duration: 82 ms
QTc Calculation (Bazett): 420 ms
R Axis: 6 degrees
T Axis: 53 degrees
Ventricular Rate: 69 {beats}/min

## 2022-02-04 LAB — COMPREHENSIVE METABOLIC PANEL
ALT: 22 U/L (ref 13–56)
AST: 15 U/L (ref 10–38)
Albumin/Globulin Ratio: 1.3 (ref 0.8–1.7)
Albumin: 3.7 g/dL (ref 3.4–5.0)
Alk Phosphatase: 88 U/L (ref 45–117)
Anion Gap: 4 mmol/L (ref 3.0–18)
BUN: 8 MG/DL (ref 7.0–18)
Bun/Cre Ratio: 13 (ref 12–20)
CO2: 32 mmol/L (ref 21–32)
Calcium: 8.4 MG/DL — ABNORMAL LOW (ref 8.5–10.1)
Chloride: 108 mmol/L (ref 100–111)
Creatinine: 0.6 MG/DL (ref 0.6–1.3)
Est, Glom Filt Rate: >60 mL/min/{1.73_m2} (ref >60)
Globulin: 2.8 g/dL (ref 2.0–4.0)
Glucose: 98 mg/dL (ref 74–99)
Potassium: 3.2 mmol/L — ABNORMAL LOW (ref 3.5–5.5)
Sodium: 144 mmol/L (ref 136–145)
Total Bilirubin: 0.7 MG/DL (ref 0.2–1.0)
Total Protein: 6.5 g/dL (ref 6.4–8.2)

## 2022-02-04 LAB — CBC WITH AUTO DIFFERENTIAL
Absolute Immature Granulocyte: 0 10*3/uL (ref 0.00–0.04)
Basophils %: 1 % (ref 0–2)
Basophils Absolute: 0.1 10*3/uL (ref 0.0–0.1)
Eosinophils %: 2 % (ref 0–5)
Eosinophils Absolute: 0.1 10*3/uL (ref 0.0–0.4)
Hematocrit: 42.6 % (ref 35.0–45.0)
Hemoglobin: 13.3 g/dL (ref 12.0–16.0)
Immature Granulocytes: 0 % (ref 0.0–0.5)
Lymphocytes %: 28 % (ref 21–52)
Lymphocytes Absolute: 1.9 10*3/uL (ref 0.9–3.6)
MCH: 27.9 PG (ref 24.0–34.0)
MCHC: 31.2 g/dL (ref 31.0–37.0)
MCV: 89.5 FL (ref 78.0–100.0)
MPV: 12.6 FL — ABNORMAL HIGH (ref 9.2–11.8)
Monocytes %: 7 % (ref 3–10)
Monocytes Absolute: 0.5 10*3/uL (ref 0.05–1.2)
Neutrophils %: 62 % (ref 40–73)
Neutrophils Absolute: 4.1 10*3/uL (ref 1.8–8.0)
Nucleated RBCs: 0 PER 100 WBC
Platelets: 146 10*3/uL (ref 135–420)
RBC: 4.76 M/uL (ref 4.20–5.30)
RDW: 14 % (ref 11.6–14.5)
WBC: 6.6 10*3/uL (ref 4.6–13.2)
nRBC: 0 10*3/uL (ref 0.00–0.01)

## 2022-02-04 NOTE — Progress Notes (Signed)
[name], 71 y.o.,  female    SUBJECTIVE  Syncope 3 days ago    Pt reports mid morning 2 hours after meal while playing bingo in an air conditioned room, suddenly felt faint, hot/blurring of vision then passed out. Awoken by her seatmate, found to have soiled herself. She did not want to be evaluated in the ER. Was able to drive home without confusion. She was not told that there were any jerking movements. 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 but has not had any syncopal episodes nor has she had bladder/ bowel incontinence  She has echo 2022 w/o concerns  She takes aldactone for HTN/hypoK    ROS:  See HPI, all others negative        Patient Active Problem List   Diagnosis    Essential hypertension    Colon polyps    Syncope    Hypokalemia    Hyperlipidemia    Incontinence in female    Vitamin D deficiency    Type 2 diabetes mellitus without complication (HCC)    Internal hemorrhoids    Elevated troponin    Mild intermittent asthma    Candidal intertrigo    Arthritis, multiple joint involvement    Hypomagnesemia    Short gut syndrome    Hypocalcemia    Chronic infection of sinus    Thrombocytopenia, unspecified (HCC)    Arthritis of knee    Non-compliance    Arthrofibrosis of knee joint, unspecified laterality       Current Outpatient Medications   Medication Sig Dispense Refill    spironolactone (ALDACTONE) 25 MG tablet TAKE 1 TABLET EVERY DAY 90 tablet 1    aspirin 81 MG EC tablet Take 1 tablet by mouth 2 times daily      calcium citrate (CALCITRATE) 950 (200 Ca) MG tablet Take 1 tablet by mouth daily      cyanocobalamin 1000 MCG tablet Take 1 tablet by mouth daily      ergocalciferol (ERGOCALCIFEROL) 1.25 MG (50000 UT) capsule Take 1 capsule by mouth every 7 days      magnesium oxide (MAG-OX) 400 MG tablet Take 1 tablet by mouth daily      nystatin (MYCOSTATIN) 100000 UNIT/GM cream Apply topically 2 times daily       potassium chloride (KLOR-CON M) 20 MEQ extended release tablet Take 2 tablets by mouth 3 times daily      thiamine 100 MG tablet Take 1 tablet by mouth daily      clindamycin (CLEOCIN) 300 MG capsule 3 po one hour prior to dental work. (Patient not taking: Reported on 12/16/2021)       No current facility-administered medications for this visit.       Allergies   Allergen Reactions    Latex Rash    Acetaminophen-Codeine Nausea Only and Other (See Comments)     Nausea and pains in stomach    Codeine Other (See Comments)     Nausea and abd pain    Buprenorphine Rash    Iodine Rash    Lactose Diarrhea     Lactose intolerance    Penicillins Itching    Seasonal Hives     Conductive electrodes       Past Medical History:   Diagnosis Date    Arthritis     knee, arms,     Asthma     resolved after gastric bypass  Chronic obstructive pulmonary disease (HCC)     no meds, since gastric bypass    Chronic pain     Diabetes (HCC)     no meds    H/O sinusitis     Hx SBO     multiple surgeries prior to 2002    Hypercholesterolemia     Hypertension     Sleep apnea     NOT USING     Stress incontinence     Thrombocytopenia (HCC)     Toe fracture, left August 2015    4th toe       Social History     Socioeconomic History    Marital status: Single     Spouse name: Not on file    Number of children: Not on file    Years of education: Not on file    Highest education level: Not on file   Occupational History    Not on file   Tobacco Use    Smoking status: Never    Smokeless tobacco: Never   Vaping Use    Vaping Use: Never used   Substance and Sexual Activity    Alcohol use: Never    Drug use: Never    Sexual activity: Yes     Partners: Male   Other Topics Concern    Not on file   Social History Narrative    Retired Psychologist, occupational, 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 Denies history of smoking      Social Determinants of Health     Financial Resource Strain: Low Risk  (04/23/2021)    Overall Financial Resource Strain (CARDIA)      Difficulty of Paying Living Expenses: Not very hard   Food Insecurity: Food Insecurity Present (07/31/2021)    Hunger Vital Sign     Worried About Running Out of Food in the Last Year: Often true     Ran Out of Food in the Last Year: Often true   Transportation Needs: Unknown (07/31/2021)    PRAPARE - Therapist, art (Medical): Not on file     Lack of Transportation (Non-Medical): No   Physical Activity: Not on file   Stress: Not on file   Social Connections: Not on file   Intimate Partner Violence: Not on file   Housing Stability: Unknown (07/31/2021)    Housing Stability Vital Sign     Unable to Pay for Housing in the Last Year: Not on file     Number of Places Lived in the Last Year: Not on file     Unstable Housing in the Last Year: No       Family History   Problem Relation Age of Onset    Rheum Arthritis Mother     Cancer Father         colon polpe    Thyroid Disease Mother     Diabetes Mother     Stroke Mother     Hypertension Mother     Cancer Maternal Aunt          OBJECTIVE    Physical Exam:     BP 134/86 (Site: Left Upper Arm, Position: Sitting, Cuff Size: Large Adult)   Pulse 66   Temp 98 F (36.7 C) (Temporal)   Resp 16   Ht 5\' 3"  (1.6 m)   Wt 202 lb (91.6 kg)   SpO2 98%   BMI 35.78 kg/m     General: alert, well-appearing, in no  apparent distress or pain  Head: atraumatic. Non-tender maxillary and frontal sinuses  Neck: supple, no adenopathy palpated  CVS: normal rate, regular rhythm, distinct S1 and S2  Lungs:clear to ausculation bilaterally, no crackles, wheezing or rhonchi noted  Abdomen: normoactive bowel sounds, soft, non-tender  Neurological: Alert, oriented to person, place and time.   CN II-XII intact  Motor exam: 5/5 in bilateral upper and lower extremities   exam reveals alert, oriented, normal speech, no focal findings or movement disorder noted, neck supple without rigidity.    Extremities: no edema, no cyanosis, MSK grossly normal  Skin: warm, no lesions,  rashes noted  Psych:  mood and affect normal        ASSESSMENT/PLAN  Raychell was seen today for loss of consciousness, fatigue and started feeling hot, was fanning self.    Diagnoses and all orders for this visit:    Syncope, unspecified syncope type  Start eval, check for cardiac/seizure  -     CBC with Auto Differential; Future  -     Comprehensive Metabolic Panel; Future  -     EKG 12 Lead  -     Echo complete (with contrast/ bubble/ strain/ 3D PRN); Future  -     MRI BRAIN WO CONTRAST; Future  -     [facility] - Aileen Pilot, MD, Cardiology, [facility])  -     Baldwin, MD, Neurology, [facility]    Bowel and bladder incontinence  -     MRI BRAIN WO CONTRAST; Future  -     [facility] - Aileen Pilot, MD, Cardiology, [facility])  -     [facility] - Janeece Fitting, MD, Neurology, [facility]    Essential hypertension  Normotensive, cont aldactone    S/p gastric bypass    Keep appt in nov    Patient understands plan of care. Patient has provided input and agrees with goals.

## 2022-02-04 NOTE — Progress Notes (Signed)
1. "Have you been to the ER, urgent care clinic since your last visit?  Hospitalized since your last visit?" No    2. "Have you seen or consulted any other health care providers outside of the [facility] since your last visit?" No     3. For patients aged 70-75: Has the patient had a colonoscopy / FIT/ Cologuard? Yes - no Care Gap present      If the patient is female:    4. For patients aged 40-74: Has the patient had a mammogram within the past 2 years? Yes - no Care Gap present      5. For patients aged 21-65: Has the patient had a pap smear? NA - based on age or sex

## 2022-02-05 NOTE — Telephone Encounter (Signed)
Referral done and faxed.

## 2022-02-06 NOTE — Telephone Encounter (Signed)
Noted. Thank you.

## 2022-02-11 ENCOUNTER — Encounter

## 2022-02-11 ENCOUNTER — Encounter (HOSPITAL_COMMUNITY): Payer: Self-pay | Admitting: Psychiatry

## 2022-02-11 ENCOUNTER — Ambulatory Visit (HOSPITAL_COMMUNITY): Payer: Medicare HMO | Admitting: Psychiatry

## 2022-02-11 DIAGNOSIS — F330 Major depressive disorder, recurrent, mild: Secondary | ICD-10-CM | POA: Diagnosis not present

## 2022-02-11 MED ORDER — CITALOPRAM HYDROBROMIDE 10 MG PO TABS: 10.0000 mg | ORAL_TABLET | Freq: Every day | ORAL | 0 refills | Status: DC

## 2022-02-11 MED ORDER — ESCITALOPRAM OXALATE 5 MG PO TABS: 5.0000 mg | ORAL_TABLET | Freq: Every day | ORAL | 0 refills | Status: DC

## 2022-02-11 NOTE — Progress Notes (Signed)
Psychiatric Initial Adult Assessment   Patient Identification: Brittany [name] MRN:  [id number] Date of Evaluation:  02/12/2022 Referral Source: Self Chief Complaint:   Chief Complaint  Patient presents with   Establish Care   Depression   Anxiety   Visit Diagnosis:    ICD-10-CM   1. MDD (major depressive disorder), recurrent episode, mild (HCC)  F33.0        Assessment:  Brittany [name] is a 71 y.o. y.o. female with a history of MDD who presents in person to [place] [facility] for initial evaluation on 02/11/22.  Patient had followed with Dr. Casimiro Needle in the past.   Patient reports experiencing increased mood lability with periods of depressed mood, tearfulness, decreased energy secondary to interpersonal stressors most often around her son.  Patient denies any SI/HI or thoughts of self-harm.  She denies any paranoia, delusions, AVH, or manic symptoms.  She also experiences periods of increased anger out of proportion to triggers.  During which time she turns the anger internal and does not act out verbally or physically.  Patient feels that since decreasing Celexa 3 years ago she has been unstable.  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  It also showed mild global parenchymal volume loss and chronic white matter microangiopathy. She has been following with Dr. Casimiro Needle but is interested in changing providers.  Dr. Nelda Marseille was was contacted for collateral and reported that he was okay with patient transition.  Plan was made to taper off of Celexa and start Lexapro at this time.  Risk and benefits were discussed.  Discussed future plans of tapering off of clorazepate which patient was open to.    Plan:  - Taper Celexa to 10 mg QD Start Lexapro 5 mg QD - Trazodone 50 mg QHS  - Clorazepate 3.75 mg BID for anxiety and depression plan to taper in the future -  Pregabalin 50 mg at bedtime for small fiber neuropathy, managed by her neurologist - Labs reviewed, TSH WNL - [name] is her therapist - had went to a grief group, not a good fit. Thinking about a divorce group - Collateral obtained from Dr. Casimiro Needle  History of Present Illness: Patient presents reporting that she is here to get a second opinion and possibly establish care for her psychiatric management.  She notes that she has been seeing Dr. Casimiro Needle at [facility] [place] for over 20 years.  Prior to that she saw Dr. Marvel Plan until they moved.  Patient reports she was seeing Dr. Mariel Kansky at a different practice prior to that and that was around the time she was first diagnosed with severe depression in 1999.  Patient had been initially diagnosed with bipolar disorder by Dr. Marvel Plan however reports that this diagnosis was later changed to MDD by Dr. Casimiro Needle when he took over her care.  During that time she had a diagnosis of bipolar disorder she was prescribed Depakote which she found over sedating.  Patient denies ever having any manic episodes.  She reports that her care with Dr. Casimiro Needle has been going well and she had been stable on her regimen of Celexa 40 mg, trazodone 50 mg at bedtime, and clorazepate 3.75 mg twice a day for over 10 years.  It was not until the Celexa dose was did reduced to 20 mg the patient reports a reoccurrence of her depressive symptoms.  She notes that the taper was 3 years ago and has since tried to medications nortriptyline and another that she cannot remember to try and manage her symptoms without success.  Due to this patient reports being frustrated and is interested in finding another provider.  Patient describes her current symptoms as labile with her either being angry or crying most of the time.  She describes the anger as being often related to a trigger, however she tends to overreact.  For example she found out she was charged an additional  $0.10 for a drink with a kids meal and became excessively angry about this.  Patient notes that when she is angry she focuses internally and does not act out verbally or physically.  When she is not angry patient reports that she is often crying instead.  The depressive periods tend to be related to interpersonal stressors most often in association with the relationship with her son and daughter-in-law.  She feels that she is being excessively kept in the dark about things and not included in their lives.  Discussed options going forward including taking of her care, returning to Dr. Casimiro Needle, medication adjustments, continuing therapy, and connecting with a processing divorce group.  Patient expressed her interest in transitioning care with Dr. Casimiro Needle and was agreeable to this provider reaching out to him for collateral.  Discussed case with Dr. Casimiro Needle and he was okay with transitioning care and confirmed the patient's treatment.  Patient expressed that she did not want to be on the medication that had weight side effects as she had excessive weight gain in the past.  We discussed Lexapro and its similarities to Celexa and patient was interested in giving it a try.  Risk and benefits were discussed.  Associated Signs/Symptoms: Depression Symptoms:  depressed mood, fatigue, anxiety, (Hypo) Manic Symptoms:  Labiality of Mood, Anxiety Symptoms:   Denies Psychotic Symptoms:   denies PTSD Symptoms: NA  Past Psychiatric History: Patient reports first experiencing symptoms in 1998 with a final diagnosis coming in 1999.  She was voluntarily hospitalized in 2000 with severe depression.  She denies any active SI but does note passive SI with no intent or plan at that time.  Patient saw Dr. Mariel Kansky, Dr. Marvel Plan, and Dr. Casimiro Needle during which time she was prescribed Depakote, Celexa, nortriptyline, trazodone, and clorazepate.  She reports being stable until 3 years ago when Celexa was decreased to 20  mg.  Patient denies any history of substance use.  Previous Psychotropic Medications: Yes   Substance Abuse History in the last 12 months:  No.  Consequences of Substance Abuse: NA  Past Medical History:  Past Medical History:  Diagnosis Date   Allergic genetic state    Anemia    Anxiety    Arthritis    Cataract    Chicken pox    Clostridioides difficile infection 46/27/0350   Complication of anesthesia    Depression    Family history of adverse reaction to anesthesia    mother - PONV   GERD (gastroesophageal reflux disease)    H/O hiatal hernia    History of GI bleed    Hypercholesteremia    Hyperlipidemia    Hypothyroidism    Osteoporosis    PONV (postoperative nausea and vomiting)    Stroke ([place]) 11/2017   Thyroid disease    Wears dentures    partial upper, full lower    Past Surgical History:  Procedure Laterality Date   ABDOMINAL HYSTERECTOMY  05/18/2000   total  BRAVO [place] STUDY  05/26/2012   Procedure: BRAVO [place];  Surgeon: Lear Ng, MD;  Location: [facility] ENDOSCOPY;  Service: Endoscopy;  Laterality: N/A;   CATARACT EXTRACTION     CHOLECYSTECTOMY  05/18/2004   COLONOSCOPY WITH PROPOFOL N/A 10/24/2018   Procedure: COLONOSCOPY WITH PROPOFOL;  Surgeon: Manya Silvas, MD;  Location: [facility] ENDOSCOPY;  Service: Endoscopy;  Laterality: N/A;   COLONOSCOPY WITH PROPOFOL N/A 08/14/2021   Procedure: COLONOSCOPY WITH PROPOFOL;  Surgeon: Annamaria Helling, DO;  Location: [facility] ENDOSCOPY;  Service: Gastroenterology;  Laterality: N/A;   detatched retina surgery     ESOPHAGOGASTRODUODENOSCOPY  05/26/2012   Procedure: ESOPHAGOGASTRODUODENOSCOPY (EGD);  Surgeon: Lear Ng, MD;  Location: [facility];  Service: Endoscopy;  Laterality: N/A;   ESOPHAGOGASTRODUODENOSCOPY  05/30/2012   Procedure: ESOPHAGOGASTRODUODENOSCOPY (EGD);  Surgeon: Cleotis Nipper, MD;  Location: [facility] ENDOSCOPY;  Service: Endoscopy;  Laterality: N/A;   ESOPHAGOGASTRODUODENOSCOPY  N/A 08/31/2012   Procedure: ESOPHAGOGASTRODUODENOSCOPY (EGD);  Surgeon: Wonda Horner, MD;  Location: [facility] ENDOSCOPY;  Service: Endoscopy;  Laterality: N/A;   ESOPHAGOGASTRODUODENOSCOPY (EGD) WITH PROPOFOL N/A 10/24/2018   Procedure: ESOPHAGOGASTRODUODENOSCOPY (EGD) WITH PROPOFOL;  Surgeon: Manya Silvas, MD;  Location[facility] ENDOSCOPY;  Service: Endoscopy;  Laterality: N/A;   ESOPHAGOGASTRODUODENOSCOPY (EGD) WITH PROPOFOL N/A 08/05/2020   Procedure: ESOPHAGOGASTRODUODENOSCOPY (EGD) WITH PROPOFOL;  Surgeon: Toledo, Benay Pike, MD;  Location: [facility] ENDOSCOPY;  Service: Gastroenterology;  Laterality: N/A;   ESOPHAGOGASTRODUODENOSCOPY (EGD) WITH PROPOFOL N/A 08/14/2021   Procedure: ESOPHAGOGASTRODUODENOSCOPY (EGD) WITH PROPOFOL;  Surgeon: Annamaria Helling, DO;  Location: [place];  Service: Gastroenterology;  Laterality: N/A;   EYE SURGERY     FOOT SURGERY Right    HIP SURGERY  1970s   "hip sunk in"   left eye surgery for detached retina     LIVER BIOPSY  2007   NECK SURGERY  05/18/2010   PLANTAR FASCIA RELEASE Left 10/10/2020   Procedure: ENDOSCOPIC PLANTAR FASC. RELEASE;  Surgeon: [name], DPM;  Location: [place];  Service: Podiatry;  Laterality: Left;  ANESTHESIA- CHOICE   ROTATOR CUFF REPAIR  03/18/2012   right side   THYROID SURGERY     nodule removed, right side   TMJ ARTHROPLASTY  05/18/1990   TUBAL LIGATION  05/19/1979    Family Psychiatric History: Both of her kids see psychiatrists for ADHD.  Family History:  Family History  Problem Relation Age of Onset   Hypertension Mother    Kidney disease Mother    Heart attack Mother    Osteoporosis Mother    Lung cancer Father    Cancer Sister    Leukemia Sister    Colon polyps Sister    Ovarian cancer Paternal Aunt    Ovarian cancer Maternal Grandmother     Social History:   Social History   Socioeconomic History   Marital status: Divorced    Spouse name: Not on file   Number of children: 2    Years of education: Not on file   Highest education level: Not on file  Occupational History   Not on file  Tobacco Use   Smoking status: Former    Packs/day: 1.50    Years: 20.00    Total pack years: 30.00    Types: Cigarettes    Quit date: 02/05/2008    Years since quitting: 14.0   Smokeless tobacco: Never  Vaping Use   Vaping Use: Never used  Substance and Sexual Activity   Alcohol use: No   Drug use:  No   Sexual activity: Not on file  Other Topics Concern   Not on file  Social History Narrative   Left Handed    Lives in a townhouse    Social Determinants of Health   Financial Resource Strain: Not on file  Food Insecurity: Not on file  Transportation Needs: Not on file  Physical Activity: Not on file  Stress: Not on file  Social Connections: Not on file    Additional Social History: Patient reports that she has 2 sons and 3 grandchildren ages 37, 27, and 45  Her oldest son lives in [place] and her youngest lives in the [place] area. She has a difficult time dealing with her youngest son and his family.  She does not get to see granddaughter as much as she would like.She has a good relationship with her eldest son but only  sees him 2-3 times a year.  She talks to him almost daily.  Patient got divorced in 2006 which was one of the most devastating things that happened to her.  She has been living on her own since 2005.  She has friends but they all have issues too. One has cancer and is not doing well, another couple is in their 52's.  Patient reports that she keeps busy at home.   Allergies:   Allergies  Allergen Reactions   Lithium Anaphylaxis    Other reaction(s): anaphylaxis   Morphine Sulfate     Other reaction(s): nausea and vomiting   Fentanyl Other (See Comments)    Extreme Sedation Other reaction(s): Lethargy (intolerance) Extreme sedation  Other reaction(s): extreme sedation   Morphine Nausea And Vomiting and Nausea Only    Other reaction(s): Other  (See Comments)   Pseudoephedrine Hypertension    Other reaction(s): Hypertension (intolerance), Other (See Comments), Other (See Comments) Hypertension Other reaction(s): Hypertension (intolerance)    Pseudoephedrine Hcl Nausea Only    Other reaction(s): hypertension, Other (See Comments)    Metabolic Disorder Labs: Lab Results  Component Value Date   HGBA1C 5.7 (H) 11/28/2017   MPG 116.89 11/28/2017   No results found for: "PROLACTIN" Lab Results  Component Value Date   CHOL 180 11/28/2017   TRIG 128 11/28/2017   HDL 31 (L) 11/28/2017   CHOLHDL 5.8 11/28/2017   VLDL 26 11/28/2017   LDLCALC 123 (H) 11/28/2017   Lab Results  Component Value Date   TSH 1.180 08/19/2021    Therapeutic Level Labs: No results found for: "LITHIUM" No results found for: "CBMZ" No results found for: "VALPROATE"  Current Medications: Current Outpatient Medications  Medication Sig Dispense Refill   citalopram (CELEXA) 10 MG tablet Take 1 tablet (10 mg total) by mouth daily. 30 tablet 0   escitalopram (LEXAPRO) 5 MG tablet Take 1 tablet (5 mg total) by mouth daily. 30 tablet 0   atorvastatin (LIPITOR) 40 MG tablet Take 40 mg by mouth daily.     betamethasone dipropionate 0.05 % cream Apply topically daily.     brimonidine (ALPHAGAN) 0.15 % ophthalmic solution SMARTSIG:In Eye(s)     Cholecalciferol 50 MCG (2000 UT) TABS Take 2,000 Units by mouth daily.     clorazepate (TRANXENE) 3.75 MG tablet Take 7.5 mg by mouth See admin instructions. Take 1 tablet (3.'75mg'$ ) by mouth twice daily - may take 1 additional tablet (3.'75mg'$ ) by mouth at midday if needed     levothyroxine (SYNTHROID, LEVOTHROID) 25 MCG tablet Take 25 mcg by mouth every morning.      montelukast (SINGULAIR)  10 MG tablet Take 10 mg by mouth at bedtime.     pantoprazole (PROTONIX) 40 MG tablet Take 40 mg by mouth daily.     pantoprazole (PROTONIX) 40 MG tablet Take by mouth. (Patient not taking: Reported on 01/16/2022)     pregabalin  (LYRICA) 50 MG capsule Take 1 capsule (50 mg total) by mouth at bedtime. 30 capsule 5   timolol (TIMOPTIC) 0.5 % ophthalmic solution SMARTSIG:In Eye(s)     traZODone (DESYREL) 50 MG tablet Take 50 mg by mouth at bedtime.     vitamin B-12 (CYANOCOBALAMIN) 1000 MCG tablet Take 1,000 mcg by mouth daily.     No current facility-administered medications for this visit.    Musculoskeletal: Strength & Muscle Tone: within normal limits Gait & Station: normal Patient leans: N/A  Psychiatric Specialty Exam: Review of Systems  There were no vitals taken for this visit.There is no height or weight on file to calculate BMI.  General Appearance: Well Groomed  Eye Contact:  Good  Speech:  Normal Rate  Volume:  Normal  Mood:  Euthymic and Irritable  Affect:  Congruent  Thought Process:  Coherent and Goal Directed  Orientation:  Full (Time, Place, and Person)  Thought Content:  Logical  Suicidal Thoughts:  No  Homicidal Thoughts:  No  Memory:  Immediate;   Good  Judgement:  Fair  Insight:  Fair  Psychomotor Activity:  Normal  Concentration:  Concentration: Good  Recall:  Good  Fund of Knowledge:Fair  Language: Good  Akathisia:  NA    AIMS (if indicated):  not done  Assets:  Communication Skills Desire for Improvement  ADL's:  Intact  Cognition: WNL  Sleep:  Good   Screenings: PHQ2-9    [place] Office Visit from 02/11/2022 in [facility]  PHQ-2 Total Score 1      Flowsheet Row Admission (Discharged) from 08/14/2021 in [place] ED from 05/30/2021 in [place] Admission (Discharged) from 10/10/2020 in [place] No Risk No Risk No Risk        Collaboration of Care: Medication Management AEB medication prescription  Patient/Guardian was advised Release of Information must be obtained prior to any record  release in order to collaborate their care with an outside provider. Patient/Guardian was advised if they have not already done so to contact the registration department to sign all necessary forms in order for [place] to release information regarding their care.   Consent: Patient/Guardian gives verbal consent for treatment and assignment of benefits for services provided during this visit. Patient/Guardian expressed understanding and agreed to proceed.   Vista Mink, MD 9/28/20231:37 PM

## 2022-02-12 ENCOUNTER — Encounter

## 2022-02-17 ENCOUNTER — Inpatient Hospital Stay: Admit: 2022-02-17 | Payer: MEDICARE | Attending: Family Medicine | Primary: Family Medicine

## 2022-02-17 DIAGNOSIS — R55 Syncope and collapse: Secondary | ICD-10-CM

## 2022-02-17 NOTE — Telephone Encounter (Signed)
Patient is requesting a call back patient sates that she was waiting to be referred to pain management that accepts her insurance, patient states Dr Madolyn Frieze did not want her to see Dr. Jorje Guild.      [phone]

## 2022-02-18 ENCOUNTER — Inpatient Hospital Stay: Admit: 2022-02-18 | Payer: MEDICARE | Attending: Family Medicine | Primary: Family Medicine

## 2022-02-18 DIAGNOSIS — R55 Syncope and collapse: Secondary | ICD-10-CM

## 2022-02-18 LAB — ECHO (TTE) COMPLETE (PRN CONTRAST/BUBBLE/STRAIN/3D)
Ao Root Index: 1.44 cm/m2
Aortic Root: 2.8 cm
Ascending Aorta Index: 1.65 cm/m2
Ascending Aorta: 3.2 cm
Body Surface Area: 2.01 m2
E/E' Lateral: 8.89
E/E' Ratio (Averaged): 8.89
E/E' Septal: 8.89
Est. RA Pressure: 3 mmHg
Fractional Shortening 2D: 36 % (ref 28–44)
IVSd: 0.7 cm (ref 0.6–0.9)
LA Volume A-L A4C: 35 mL (ref 22–52)
LA Volume A-L A4C: 79 mL — AB (ref 22–52)
LA Volume A/L: 59 mL
LA Volume BP: 54 mL — AB (ref 22–52)
LA Volume Index A-L A2C: 18 mL/m2 (ref 16–34)
LA Volume Index A-L A4C: 41 mL/m2 — AB (ref 16–34)
LA Volume Index A/L: 30 mL/m2 (ref 16–34)
LA Volume Index BP: 28 ml/m2 (ref 16–34)
LV E' Lateral Velocity: 9 cm/s
LV E' Septal Velocity: 9 cm/s
LV Mass 2D Index: 53.1 g/m2 (ref 43–95)
LV Mass 2D: 103.1 g (ref 67–162)
LV RWT Ratio: 0.3
LVIDd Index: 2.42 cm/m2
LVIDd: 4.7 cm (ref 3.9–5.3)
LVIDs Index: 1.55 cm/m2
LVIDs: 3 cm
LVOT Area: 3.1 cm2
LVOT Diameter: 2 cm
LVOT Mean Gradient: 1 mmHg
LVOT Peak Gradient: 3 mmHg
LVOT Peak Velocity: 0.8 m/s
LVOT SV: 57.1 ml
LVOT Stroke Volume Index: 29.5 mL/m2
LVOT VTI: 18.2 cm
LVPWd: 0.7 cm (ref 0.6–0.9)
MV A Velocity: 0.43 m/s
MV E Velocity: 0.8 m/s
MV E Wave Deceleration Time: 161.9 ms
MV E/A: 1.86
RV Basal Dimension: 4 cm
RVSP: 28 mmHg
TR Max Velocity: 2.49 m/s
TR Peak Gradient: 25 mmHg

## 2022-02-19 ENCOUNTER — Ambulatory Visit: Payer: MEDICARE | Attending: Specialist | Primary: Family Medicine

## 2022-02-19 NOTE — Progress Notes (Deleted)
Patient: Cheryl Paul                MRN: [id number]       SSN: [id number]  Date of Birth: [birth year]        AGE: 71 y.o.        SEX: female      PCP: Mable Fill, MD  02/18/22    No chief complaint on file.      HISTORY:  Cheryl Paul is a 71 y.o. female who is seen for increased left knee pain. She was last seen on 01/22/22 for continued left knee pain and stiffness.       She is s/p left knee arthroplasty on 10/28/20 by Dr. Meyer Cory.  She underwent left knee manipulation on 01/14/2021. She has been elevating her legs with some benefit. She states that she is allergic to Tylenol #3.      Patient was last seen on 11/11/2018 for right hip and knee pain. She has been experiencing right  hip pain for the past several months. She states that she fell many years ago. She states that she aches when sleeping. She says she has knots in her right hip.She feels pain in her lateral hip with standing and walking. Her hip  stiffens up after she  sits for long periods of time.       She is also seen for rightl knee pain. She has been experiencing worsening pain for the past few years. She notes that her R knee pain has decreased  since bariatric surgery. She states previous x-rays of her knees showed severe osteoarthritis.  She states that her knees pop like popcorn.  She was previously seen at [facility] by Dr. Hollice Espy about two years ago.  She states she had previous  cortisone injections.  She has difficulty standing and sitting.  She previously saw a neurosurgereon who discussed doing an laminectomy.  She has posterior burning sensations in her knees.       She was previously seen for bilateral shoulder pain. She has shoulder pain radiating into her upper arms.    She underwent bariatric surgery on 02/22/18 by Dr. Karleen Hampshire. She was 285# before surgery and now weighs 202#.     She sees Dr. Nelva Bush for pain management. She was previously seen by Dr. Paulina Fusi but is no longer able to be seen at [facility]  since she switched insurances.      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   She has two great grandchildren ages 80 and 2.  She has 5 grandchildren. HGA1C 10.4 as of 04/06/16. She was doing aqua therapy at the [facility] but had to stop due to the Coronavirus shutdown. She states that her PT in [place] increased her knee pain.  She weighs herself every Friday. Current weight and height are 192 lbs and 5'3".   Wt Readings from Last 3 Encounters:   02/17/22 201 lb (91.2 kg)   02/04/22 202 lb (91.6 kg)   12/16/21 202 lb (91.6 kg)      There is no height or weight on file to calculate BMI.    Patient Active Problem List   Diagnosis    Essential hypertension    Colon polyps    Syncope    Hypokalemia    Hyperlipidemia    Incontinence in  female    Vitamin D deficiency    Type 2 diabetes mellitus without complication (HCC)    Internal hemorrhoids    Elevated troponin    Mild intermittent asthma    Candidal intertrigo    Arthritis, multiple joint involvement    Hypomagnesemia    Short gut syndrome    Hypocalcemia    Chronic infection of sinus    Thrombocytopenia, unspecified (HCC)    Arthritis of knee    Non-compliance    Arthrofibrosis of knee joint, unspecified laterality       Social History     Tobacco Use    Smoking status: Never    Smokeless tobacco: Never   Vaping Use    Vaping Use: Never used   Substance Use Topics    Alcohol use: Never    Drug use: Never        Allergies   Allergen Reactions    Latex Rash    Acetaminophen-Codeine Nausea Only and Other (See Comments)     Nausea and pains in stomach    Codeine Other (See Comments)     Nausea and abd pain    Buprenorphine Rash    Iodine Rash    Lactose Diarrhea     Lactose intolerance    Penicillins Itching    Seasonal Hives     Conductive electrodes        Current Outpatient Medications   Medication Sig     spironolactone (ALDACTONE) 25 MG tablet TAKE 1 TABLET EVERY DAY    aspirin 81 MG EC tablet Take 1 tablet by mouth 2 times daily    calcium citrate (CALCITRATE) 950 (200 Ca) MG tablet Take 1 tablet by mouth daily    clindamycin (CLEOCIN) 300 MG capsule 3 po one hour prior to dental work. (Patient not taking: Reported on 12/16/2021)    cyanocobalamin 1000 MCG tablet Take 1 tablet by mouth daily    ergocalciferol (ERGOCALCIFEROL) 1.25 MG (50000 UT) capsule Take 1 capsule by mouth every 7 days    magnesium oxide (MAG-OX) 400 MG tablet Take 1 tablet by mouth daily    nystatin (MYCOSTATIN) 100000 UNIT/GM cream Apply topically 2 times daily    potassium chloride (KLOR-CON M) 20 MEQ extended release tablet Take 2 tablets by mouth 3 times daily    thiamine 100 MG tablet Take 1 tablet by mouth daily     No current facility-administered medications for this visit.        PHYSICAL EXAMINATION:  There were no vitals taken for this visit.     ORTHO EXAMINATION:       Examination  Left knee  Right knee         Skin  Intact  Intact         Range of motion  95-5 75-25     Effusion  - -     Medial joint line tenderness  +  +     Lateral joint line tenderness  -  -     Popliteal tenderness  -  -     Osteophytes palpable  +, medial   +, medial     McMurray's  -  -     Patella crepitus  +  +     Anterior drawer  -  -     Lateral laxity  -  -     Medial laxity  -  -     Varus deformity  +  +  Valgus deformity  -  -     Pretibial edema  2+  2+         Calf tenderness  -  -            Examination  Right shoulder  Left shoulder     Skin  Intact  Intact     Effusion  -  -     Biceps deformity  -  -     Atrophy  -  -     AC joint tenderness  -  -     Acromial tenderness  +  +     Biceps tenderness  -  -     Forward flexion/Elevation ROM  175  175     Active abduction ROM  160  160     External rotation ROM  90  90     Internal rotation ROM  70  70     Apprehension  -  -     Impingement  -  -     Drop Arm Test  -  -         Neurovascular   Intact  Intact      RADIOGRAPHS:   01/12/22 XR LEFT KNEE 3 VIEWS [facility]  IMPRESSION:  Intact total knee arthroplasty.  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 No appreciable joint effusion.    PROCEDURE: ***    IMPRESSION:  No diagnosis found.    PLAN:  ***    Documentation by Lowell Guitar, scribe, as documented by Tonia Brooms, MD.

## 2022-03-06 ENCOUNTER — Inpatient Hospital Stay: Admit: 2022-03-06 | Payer: MEDICARE | Primary: Family Medicine

## 2022-03-06 DIAGNOSIS — E876 Hypokalemia: Secondary | ICD-10-CM

## 2022-03-06 LAB — BASIC METABOLIC PANEL
Anion Gap: 3 mmol/L (ref 3.0–18)
BUN: 12 MG/DL (ref 7.0–18)
Bun/Cre Ratio: 19 (ref 12–20)
CO2: 32 mmol/L (ref 21–32)
Calcium: 9 MG/DL (ref 8.5–10.1)
Chloride: 107 mmol/L (ref 100–111)
Creatinine: 0.64 MG/DL (ref 0.6–1.3)
Est, Glom Filt Rate: >60 mL/min/{1.73_m2} (ref >60)
Glucose: 108 mg/dL — ABNORMAL HIGH (ref 74–99)
Potassium: 4.5 mmol/L (ref 3.5–5.5)
Sodium: 142 mmol/L (ref 136–145)

## 2022-03-11 ENCOUNTER — Ambulatory Visit (HOSPITAL_COMMUNITY): Payer: Medicare HMO | Admitting: Psychiatry

## 2022-03-11 ENCOUNTER — Encounter (HOSPITAL_COMMUNITY): Payer: Self-pay | Admitting: Psychiatry

## 2022-03-11 VITALS — BP 102/64 | HR 90 | Temp 98.4°F | Ht 64.5 in | Wt 144.0 lb

## 2022-03-11 DIAGNOSIS — F330 Major depressive disorder, recurrent, mild: Secondary | ICD-10-CM | POA: Diagnosis not present

## 2022-03-11 MED ORDER — DULOXETINE HCL 30 MG PO CPEP: ORAL_CAPSULE | ORAL | 0 refills | Status: DC

## 2022-03-11 NOTE — Progress Notes (Unsigned)
[place] MD/PA/NP OP Progress Note  03/11/2022 2:24 PM Brittany [name]  MRN:  [id number]  Visit Diagnosis:    ICD-10-CM   1. MDD (major depressive disorder), recurrent episode, mild (HCC)  F33.0 DULoxetine (CYMBALTA) 30 MG capsule      Assessment: Brittany [name] is a 71 y.o. y.o. female with a history of MDD who presented to [place] at [facility] for initial evaluation on 02/11/22.  Patient had followed with Dr. Casimiro Needle in the past.   At initial evaluation patient reported experiencing increased mood lability with periods of depressed mood, tearfulness, decreased energy secondary to interpersonal stressors most often around her son.  Patient denied any SI/HI or thoughts of self-harm.  She denied any paranoia, delusions, AVH, or manic symptoms.  She also endorsed experiencing periods of increased anger out of proportion to triggers.  During which time she turns the anger internal and does not act out verbally or physically.  Patient felt that since decreasing Celexa 3 years ago she has been unstable.  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  It also showed mild global parenchymal volume loss and chronic white matter microangiopathy.   Brittany [name] presents for follow-up evaluation. Today, 03/11/22, patient reports some improvement in her mood lability over the past month.  She has had episodes where she would have become more depressed or irritable in the past, however was able to pause and evaluate her thoughts before getting excessively angry.  By doing this she was able to rationalize, remember, and prevent herself from getting worked up.  After discussing with her PCP patient was interested in starting an antidepressant medication that also help with pain symptoms of her small fiber neuropathy.  We discussed the risk and benefits of this and will continue to taper off Celexa,  discontinue Lexapro, and start Cymbalta for anxiety and mood management.  We will also start to taper off of clorazepate by decreasing the morning dose to half a tab.  Risk and benefits of the taper off of clorazepate were discussed.  Plan:  - Taper Celexa to 10 mg QD - Discontinue Lexapro 5 mg QD due to treatment change - Start Cymbalta 30 mg daily and increase to 30 twice a day in 2 days for mood and pain - Continue Trazodone 50 mg QHS  - Taper Clorazepate to 1.875 QAM and 3.75 mg QHS for anxiety and depression plan to taper in the future - Pregabalin 50 mg at bedtime for small fiber neuropathy, managed by her neurologist - Labs reviewed, TSH WNL - Brittany [name] is her therapist - had went to a grief group, not a good fit. Thinking about a divorce group - Collateral obtained from Dr. Casimiro Needle    Chief Complaint:  Chief Complaint  Patient presents with   Follow-up   HPI: [name] presents reporting that she think she is doing a little better over the past month and is trying hard to not let things get to her.  She gave an example of how she found out her granddaughter is being taken on a disney cruise for her birthday by her parents and Brittany [name] was frustrated that she was not informed until recently. However instead of getting upset instantly she calls to think about it and remembered that she was asked about going on the cruise early in the year.  Patient had said no because she struggles  with seasickness.  After remembering that she was still frustrated that she was not updated about this trip more recently but less so than she would have been in the past.  As she thought about why she was getting mad over this she was able to rationalize and then calm down.  She still expresses some concern that her granddaughter will be there for her her actual birthday and that her parents will take her out of school but feels like these are within normal limits of worrying.  Outside of this patient felt the  medication has been working well but did speak with her PCP about the possibility of trying medication for both the anxiety/depression and the pain.  We talked to her about this and the differences in a medication like Cymbalta versus Lexapro/Celexa that she has been on.  After discussing the various risk and benefits patient was open to trying out Cymbalta in place of Celexa/Lexapro.  She was agreeable to continue to taper off Celexa, discontinue Lexapro and titrate up on Cymbalta in its place.  We also discussed the plan to taper off of clorazepate going forward which patient is still open to.  She notes that she had been prescribed 3.75 mg 3 times a day however she has only been taking it twice a day for several years.  We talked over the side effects of withdrawal during a taper of her long-term medication which patient will monitor while we decrease the morning dose by half.  Also discussed her care with Dr. Casimiro Needle and our communication with him.  Dr. Casimiro Needle who is agreeable to patient transitioning care to a new provider and Taliyah will cancel her follow-up in a few weeks with him.  Past Psychiatric History: Patient reports first experiencing symptoms in 1998 with a final diagnosis coming in 1999.  She was voluntarily hospitalized in 2000 with severe depression.  She denies any active SI but does note passive SI with no intent or plan at that time.  Patient saw Dr. Mariel Kansky, Dr. Marvel Plan, and Dr. Casimiro Needle during which time she was prescribed Depakote, Celexa, nortriptyline, trazodone, and clorazepate.  She reports being stable until 2020 when Celexa was decreased to 20 mg.  Patient denies any history of substance use.  Past Medical History:  Past Medical History:  Diagnosis Date   Allergic genetic state    Anemia    Anxiety    Arthritis    Cataract    Chicken pox    Clostridioides difficile infection 40/98/1191   Complication of anesthesia    Depression    Family history of adverse reaction  to anesthesia    mother - PONV   GERD (gastroesophageal reflux disease)    H/O hiatal hernia    History of GI bleed    Hypercholesteremia    Hyperlipidemia    Hypothyroidism    Osteoporosis    PONV (postoperative nausea and vomiting)    Stroke ([place]) 11/2017   Thyroid disease    Wears dentures    partial upper, full lower    Past Surgical History:  Procedure Laterality Date   ABDOMINAL HYSTERECTOMY  05/18/2000   total   BRAVO [place] STUDY  05/26/2012   Procedure: BRAVO [place] STUDY;  Surgeon: Lear Ng, MD;  Location: [facility] ENDOSCOPY;  Service: Endoscopy;  Laterality: N/A;   CATARACT EXTRACTION     CHOLECYSTECTOMY  05/18/2004   COLONOSCOPY WITH PROPOFOL N/A 10/24/2018   Procedure: COLONOSCOPY WITH PROPOFOL;  Surgeon: Manya Silvas, MD;  Location: [facility] ENDOSCOPY;  Service: Endoscopy;  Laterality: N/A;   COLONOSCOPY WITH PROPOFOL N/A 08/14/2021   Procedure: COLONOSCOPY WITH PROPOFOL;  Surgeon: Annamaria Helling, DO;  Location: [facility] ENDOSCOPY;  Service: Gastroenterology;  Laterality: N/A;   detatched retina surgery     ESOPHAGOGASTRODUODENOSCOPY  05/26/2012   Procedure: ESOPHAGOGASTRODUODENOSCOPY (EGD);  Surgeon: Lear Ng, MD;  Location: [facility];  Service: Endoscopy;  Laterality: N/A;   ESOPHAGOGASTRODUODENOSCOPY  05/30/2012   Procedure: ESOPHAGOGASTRODUODENOSCOPY (EGD);  Surgeon: Cleotis Nipper, MD;  Location: [facility];  Service: Endoscopy;  Laterality: N/A;   ESOPHAGOGASTRODUODENOSCOPY N/A 08/31/2012   Procedure: ESOPHAGOGASTRODUODENOSCOPY (EGD);  Surgeon: Wonda Horner, MD;  Location: [facility] ENDOSCOPY;  Service: Endoscopy;  Laterality: N/A;   ESOPHAGOGASTRODUODENOSCOPY (EGD) WITH PROPOFOL N/A 10/24/2018   Procedure: ESOPHAGOGASTRODUODENOSCOPY (EGD) WITH PROPOFOL;  Surgeon: Manya Silvas, MD;  Location[facility] END[facility]COPY;  Service: Endoscopy;  Laterality: N/A;   ESOPHAGOGASTRODUODENOSCOPY (EGD) WITH PROPOFOL N/A 08/05/2020   Procedure:  ESOPHAGOGASTRODUODENOSCOPY (EGD) WITH PROPOFOL;  Surgeon: Toledo, Benay Pike, MD;  Location: [facility] ENDOSCOPY;  Service: Gastroenterology;  Laterality: N/A;   ESOPHAGOGASTRODUODENOSCOPY (EGD) WITH PROPOFOL N/A 08/14/2021   Procedure: ESOPHAGOGASTRODUODENOSCOPY (EGD) WITH PROPOFOL;  Surgeon: Annamaria Helling, DO;  Location: [place];  Service: Gastroenterology;  Laterality: N/A;   EYE SURGERY     FOOT SURGERY Right    HIP SURGERY  1970s   "hip sunk in"   left eye surgery for detached retina     LIVER BIOPSY  2007   NECK SURGERY  05/18/2010   PLANTAR FASCIA RELEASE Left 10/10/2020   Procedure: ENDOSCOPIC PLANTAR FASC. RELEASE;  Surgeon: [name], DPM;  Location: [place];  Service: Podiatry;  Laterality: Left;  ANESTHESIA- CHOICE   ROTATOR CUFF REPAIR  03/18/2012   right side   THYROID SURGERY     nodule removed, right side   TMJ ARTHROPLASTY  05/18/1990   TUBAL LIGATION  05/19/1979    Family Psychiatric History: Both of her kids see psychiatrists for ADHD  Family History:  Family History  Problem Relation Age of Onset   Hypertension Mother    Kidney disease Mother    Heart attack Mother    Osteoporosis Mother    Lung cancer Father    Cancer Sister    Leukemia Sister    Colon polyps Sister    Ovarian cancer Paternal Aunt    Ovarian cancer Maternal Grandmother     Social History:  Social History   Socioeconomic History   Marital status: Divorced    Spouse name: Not on file   Number of children: 2   Years of education: Not on file   Highest education level: Not on file  Occupational History   Not on file  Tobacco Use   Smoking status: Former    Packs/day: 1.50    Years: 20.00    Total pack years: 30.00    Types: Cigarettes    Quit date: 02/05/2008    Years since quitting: 14.1   Smokeless tobacco: Never  Vaping Use   Vaping Use: Never used  Substance and Sexual Activity   Alcohol use: No   Drug use: No   Sexual activity: Not on file   Other Topics Concern   Not on file  Social History Narrative   Left Handed    Lives in a townhouse    Social Determinants of Health   Financial Resource Strain: Not on file  Food Insecurity: Not on file  Transportation Needs: Not on file  Physical Activity: Not on file  Stress: Not on file  Social Connections: Not on file    Allergies:  Allergies  Allergen Reactions   Lithium Anaphylaxis    Other reaction(s): anaphylaxis   Morphine Sulfate     Other reaction(s): nausea and vomiting   Fentanyl Other (See Comments)    Extreme Sedation Other reaction(s): Lethargy (intolerance) Extreme sedation  Other reaction(s): extreme sedation   Morphine Nausea And Vomiting and Nausea Only    Other reaction(s): Other (See Comments)   Pseudoephedrine Hypertension    Other reaction(s): Hypertension (intolerance), Other (See Comments), Other (See Comments) Hypertension Other reaction(s): Hypertension (intolerance)    Pseudoephedrine Hcl Nausea Only    Other reaction(s): hypertension, Other (See Comments)    Current Medications: Current Outpatient Medications  Medication Sig Dispense Refill   DULoxetine (CYMBALTA) 30 MG capsule Take 1 capsule (30 mg total) by mouth daily for 7 days, THEN 1 capsule (30 mg total) 2 (two) times daily for 23 days. 55 capsule 0   atorvastatin (LIPITOR) 40 MG tablet Take 40 mg by mouth daily.     betamethasone dipropionate 0.05 % cream Apply topically daily.     brimonidine (ALPHAGAN) 0.15 % ophthalmic solution SMARTSIG:In Eye(s)     Cholecalciferol 50 MCG (2000 UT) TABS Take 2,000 Units by mouth daily.     clorazepate (TRANXENE) 3.75 MG tablet Take 7.5 mg by mouth See admin instructions. Take 1 tablet (3.'75mg'$ ) by mouth twice daily - may take 1 additional tablet (3.'75mg'$ ) by mouth at midday if needed     levothyroxine (SYNTHROID, LEVOTHROID) 25 MCG tablet Take 25 mcg by mouth every morning.      montelukast (SINGULAIR) 10 MG tablet Take 10 mg by mouth at  bedtime.     pantoprazole (PROTONIX) 40 MG tablet Take 40 mg by mouth daily.     pantoprazole (PROTONIX) 40 MG tablet Take by mouth. (Patient not taking: Reported on 01/16/2022)     pregabalin (LYRICA) 50 MG capsule Take 1 capsule (50 mg total) by mouth at bedtime. 30 capsule 5   timolol (TIMOPTIC) 0.5 % ophthalmic solution SMARTSIG:In Eye(s)     traZODone (DESYREL) 50 MG tablet Take 50 mg by mouth at bedtime.     vitamin B-12 (CYANOCOBALAMIN) 1000 MCG tablet Take 1,000 mcg by mouth daily.     No current facility-administered medications for this visit.     Musculoskeletal: Strength & Muscle Tone: within normal limits Gait & Station: normal Patient leans: N/A  Psychiatric Specialty Exam: Review of Systems  There were no vitals taken for this visit.There is no height or weight on file to calculate BMI.  General Appearance: Well Groomed  Eye Contact:  Good  Speech:  Clear and Coherent and Normal Rate  Volume:  Normal  Mood:  Euthymic  Affect:  Congruent  Thought Process:  Coherent and Goal Directed  Orientation:  Full (Time, Place, and Person)  Thought Content: Logical   Suicidal Thoughts:  No  Homicidal Thoughts:  No  Memory:  NA  Judgement:  Good  Insight:  Fair  Psychomotor Activity:  Normal  Concentration:  Concentration: Good  Recall:  Good  Fund of Knowledge: Good  Language: Good  Akathisia:  NA    AIMS (if indicated): not done  Assets:  Communication Skills Desire for Improvement Housing Leisure Time Resilience Transportation  ADL's:  Intact  Cognition: WNL  Sleep:  Good   Metabolic Disorder Labs: Lab Results  Component Value Date   HGBA1C 5.7 (H)  11/28/2017   MPG 116.89 11/28/2017   No results found for: "PROLACTIN" Lab Results  Component Value Date   CHOL 180 11/28/2017   TRIG 128 11/28/2017   HDL 31 (L) 11/28/2017   CHOLHDL 5.8 11/28/2017   VLDL 26 11/28/2017   LDLCALC 123 (H) 11/28/2017   Lab Results  Component Value Date   TSH 1.180  08/19/2021   TSH 4.830 (H) 08/31/2012    Therapeutic Level Labs: No results found for: "LITHIUM" No results found for: "VALPROATE" No results found for: "CBMZ"   Screenings: [place] Office Visit from 02/11/2022 in [facility]  PHQ-2 Total Score 1      Flowsheet Row Admission (Discharged) from 08/14/2021 in [place] ED from 05/30/2021 in [place] Admission (Discharged) from 10/10/2020 in [place] CATEGORY No Risk No Risk No Risk       Collaboration of Care: Collaboration of Care: Medication Management AEB medication prescription and Psychiatrist AEB spoke with Dr. Casimiro Needle to obtain collateral  Patient/Guardian was advised Release of Information must be obtained prior to any record release in order to collaborate their care with an outside provider. Patient/Guardian was advised if they have not already done so to contact the registration department to sign all necessary forms in order for [place] to release information regarding their care.   Consent: Patient/Guardian gives verbal consent for treatment and assignment of benefits for services provided during this visit. Patient/Guardian expressed understanding and agreed to proceed.    Vista Mink, MD 03/11/2022, 2:24 PM

## 2022-03-18 ENCOUNTER — Ambulatory Visit: Payer: MEDICARE | Attending: Family Medicine | Primary: Family Medicine

## 2022-03-20 ENCOUNTER — Ambulatory Visit
Admit: 2022-03-20 | Discharge: 2022-03-20 | Payer: MEDICARE | Attending: Cardiovascular Disease | Primary: Family Medicine

## 2022-03-20 ENCOUNTER — Ambulatory Visit: Admit: 2022-03-20 | Discharge: 2022-03-20 | Payer: MEDICARE | Attending: Family Medicine | Primary: Family Medicine

## 2022-03-20 ENCOUNTER — Other Ambulatory Visit (HOSPITAL_COMMUNITY): Payer: Self-pay | Admitting: Psychiatry

## 2022-03-20 DIAGNOSIS — I10 Essential (primary) hypertension: Secondary | ICD-10-CM

## 2022-03-20 DIAGNOSIS — Z23 Encounter for immunization: Secondary | ICD-10-CM

## 2022-03-20 MED ORDER — AMLODIPINE BESYLATE 2.5 MG PO TABS
2.5 MG | ORAL_TABLET | Freq: Every day | ORAL | 0 refills | Status: DC
Start: 2022-03-20 — End: 2022-03-20

## 2022-03-20 MED ORDER — SPIRONOLACTONE 50 MG PO TABS
50 MG | ORAL_TABLET | Freq: Every day | ORAL | 0 refills | Status: DC
Start: 2022-03-20 — End: 2022-06-03

## 2022-03-20 NOTE — Progress Notes (Signed)
SUBJECTIVE  Ff-up    HTN- currently on aldactone  Hypokalemia- endo eval neg for hyperaldo, currently on aldactone and kcl 40 meqs TID with normal K levels.     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   However since procedure has had loose stools after meals, has consulted with nutritionist/bariatric center without improvement, interested in seeing GI at this point.     DM-diet controlled, she does not check glucose at home. She is no longer on metformin.      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 Says weight loss improved respirotary symptoms. denies cough, sob, wheezing. She says last albuterol use was a year ago. Usually heat triggers respiratory symptoms.    Syncope w/ bowel and bladder incontinence- ongoing evaluation thus far work up unrevealing MRI brain chronic microvascular changes, Echo without significant findings. cardiology eval todayplan for 30 day even monitor. Neuro eval plan for EEG this month.     ROS:  See HPI, all others negative        Patient Active Problem List   Diagnosis Code    Chronic infection of sinus J32.9    Type 2 diabetes mellitus without complication (HCC) E11.9    Incontinence in female R32    Candidal intertrigo B37.2    Arthritis, multiple joint involvement M12.9    Essential hypertension I10    COPD (chronic obstructive pulmonary disease) (HCC) J44.9    Hyperlipidemia E78.5    Vitamin D deficiency E55.9    Internal hemorrhoids K64.8    Colon polyps K63.5    Hypokalemia E87.6    Hypomagnesemia E83.42    Syncope R55    Elevated troponin R77.8    Short gut syndrome K91.2    Hypocalcemia E83.51    Mild intermittent asthma J45.20    Thrombocytopenia, unspecified D69.6    Arthritis of knee M17.10    Non-compliance Z91.199    Arthrofibrosis of knee joint, 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         Current Outpatient Medications:     HYDROcodone-acetaminophen (NORCO)  5-325 MG per tablet, Take 1 tablet by mouth every 6 hours as needed for Pain., Disp: , Rfl:     spironolactone (ALDACTONE) 50 MG tablet, Take 1 tablet by mouth daily, Disp: 90 tablet, Rfl: 0    aspirin 81 MG EC tablet, Take 1 tablet by mouth 2 times daily, Disp: , Rfl:     calcium citrate (CALCITRATE) 950 (200 Ca) MG tablet, Take 1 tablet by mouth daily, Disp: , Rfl:     cyanocobalamin 1000 MCG tablet, Take 1 tablet by mouth daily, Disp: , Rfl:     ergocalciferol (ERGOCALCIFEROL) 1.25 MG (50000 UT) capsule, Take 1 capsule by mouth every 7 days, Disp: , Rfl:     magnesium oxide (MAG-OX) 400 MG tablet, Take 1 tablet by mouth daily, Disp: , Rfl:     nystatin (MYCOSTATIN) 100000 UNIT/GM cream, Apply topically 2 times daily, Disp: , Rfl:     potassium chloride (KLOR-CON M) 20 MEQ extended release tablet, Take 2 tablets by mouth daily, Disp: , Rfl:     thiamine 100 MG tablet, Take 1 tablet by mouth daily, Disp: , Rfl:         Allergies   Allergen Reactions    Latex Rash    Acetaminophen-Codeine Nausea Only and Other (comments)     Nausea and pains in stomach    Codeine Other (comments)  Nausea and abd pain    Butrans [Buprenorphine] Rash    Iodine Rash    Lactose Diarrhea     Lactose intolerance    Other Plant, Educational psychologist, Environmental Hives     Conductive electrodes    Penicillins Itching       Past Medical History:   Diagnosis Date    Arthritis     knee, arms,     Asthma     resolved after gastric bypass    Chronic obstructive pulmonary disease (HCC)     no meds, since gastric bypass    Chronic pain     Diabetes (HCC)     no meds    H/O sinusitis     Hx SBO     multiple surgeries prior to 2002    Hypercholesterolemia     Hypertension     Sleep apnea     NOT USING     Stress incontinence     Thrombocytopenia (HCC)     Toe fracture, left August 2015    4th toe       Social History     Socioeconomic History    Marital status: SINGLE     Spouse name: Not on file    Number of children: Not on file    Years of education: Not  on file    Highest education level: Not on file   Occupational History    Not on file   Tobacco Use    Smoking status: Never    Smokeless tobacco: Never   Vaping Use    Vaping Use: Never used   Substance and Sexual Activity    Alcohol use: No     Alcohol/week: 0.0 standard drinks    Drug use: No    Sexual activity: Not Currently     Partners: Male   Other Topics Concern    Not on file   Social History Narrative    Retired Psychologist, occupational, 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 Denies history of smoking      Social Determinants of Psychologist, prison and probation services Strain: Low Risk     Difficulty of Paying Living Expenses: Not very hard   Food Insecurity: No Food Insecurity    Worried About Programme researcher, broadcasting/film/video in the Last Year: Never true    Ran Out of Food in the Last Year: Never true   Transportation Needs: Not on file   Physical Activity: Not on file   Stress: Not on file   Social Connections: Not on file   Intimate Partner Violence: Not on file   Housing Stability: Not on file       Family History   Problem Relation Age of Onset    Hypertension Mother     Stroke Mother     Diabetes Mother     Thyroid Disease Mother     Arthritis-rheumatoid Mother     Cancer Father         colon polpe    Cancer Maternal Aunt          OBJECTIVE    Physical Exam:     BP 136/86 (Site: Left Upper Arm, Position: Sitting, Cuff Size: Medium Adult)   Pulse 65   Temp 97.7 F (36.5 C) (Temporal)   Resp 16   Ht 1.6 m (5\' 3" )   Wt 91.6 kg (202 lb)   SpO2 98%   BMI 35.78 kg/m         General:  alert, well-appearing,obese,AA, in no apparent distress or pain  Neck: supple, no adenopathy palpated  CVS: normal rate, regular rhythm, distinct S1 and S2  Lungs:clear to ausculation bilaterally, no crackles, wheezing or rhonchi noted  Skin: warm, no lesions, rashes noted  Psych:  mood and affect normal  CMP:   Lab Results   Component Value Date/Time    NA 142 03/06/2022 08:38 AM    K 4.5 03/06/2022 08:38 AM    CL 107 03/06/2022 08:38 AM    CO2  32 03/06/2022 08:38 AM    BUN 12 03/06/2022 08:38 AM    CREATININE 0.64 03/06/2022 08:38 AM    GLUCOSE 108 03/06/2022 08:38 AM    GLUCOSE 107 12/10/2021 10:40 AM    CALCIUM 9.0 03/06/2022 08:38 AM    PROT 6.5 02/04/2022 10:26 AM    LABALBU 3.7 02/04/2022 10:26 AM    BILITOT 0.7 02/04/2022 10:26 AM    AST 15 02/04/2022 10:26 AM    ALT 22 02/04/2022 10:26 AM        CBC:   Lab Results   Component Value Date/Time    WBC 6.6 02/04/2022 10:26 AM    RBC 4.76 02/04/2022 10:26 AM    RBC 4.74 12/10/2021 10:40 AM    HGB 13.3 02/04/2022 10:26 AM    HCT 42.6 02/04/2022 10:26 AM    MCV 89.5 02/04/2022 10:26 AM    MCH 27.9 02/04/2022 10:26 AM    MCHC 31.2 02/04/2022 10:26 AM    RDW 14.0 02/04/2022 10:26 AM    PLT 146 02/04/2022 10:26 AM    MPV 12.6 02/04/2022 10:26 AM        Lipids   Lab Results   Component Value Date/Time    CHOL 166 12/10/2021 10:40 AM    CHOL 160 09/03/2020 07:47 AM    TRIG 89 12/10/2021 10:40 AM    HDL 61 12/10/2021 10:40 AM    LDLCALC 87 12/10/2021 10:40 AM    LABVLDL 18 12/10/2021 10:40 AM    CHOLHDLRATIO 2.7 12/10/2021 10:40 AM         Imaging results last 24 hrs :MAM TOMO DIGITAL SCREEN BILATERAL    Result Date: 07/31/2021  BIRADS: 1-NEGATIVE BREAST DENSITY: C-The breasts are heterogeneously dense, which may obscure small masses. SCREENING MAMMOGRAM BILATERAL  3D tomosynthesis HISTORY: Screening. COMPARISON: 22 and other priors FINDINGS: 2D and 3D images were performed. Digital mammograms were performed. No suspicious microcalcifications, masses, or areas of architectural distortion. Interpretation performed in conjunction with computed assisted detection system.     No evidence of malignancy.  Suggest routine follow-up.      Imaging results impression onlyMAM TOMO DIGITAL SCREEN BILATERAL    Result Date: 07/31/2021  No evidence of malignancy.  Suggest routine follow-up.     No orders to display       A1c:   Hemoglobin A1C   Date Value Ref Range Status   12/10/2021 5.8 (H) 4.8 - 5.6 % Final   04/18/2021 5.7  (H) 4.2 - 5.6 % Final     Comment:     (NOTE)  HbA1C Interpretive Ranges  <5.7              Normal  5.7 - 6.4         Consider Prediabetes  >6.5              Consider Diabetes     12/20/2020 5.7 (H) 4.2 - 5.6 % Final     Comment:     (  NOTE)  HbA1C Interpretive Ranges  <5.7              Normal  5.7 - 6.4         Consider Prediabetes  >6.5              Consider Diabetes         ASSESSMENT/PLAN  Diagnoses and all orders for this visit:     Essential hypertension  controlled w/ hypoKalemia issues  Plan to increase aldactone to 50 mg  Reduce from  kcl 20 meqs TID to OD (had has chronic hypoK issues since gastric bypass)  DASH diet, BP Log  BMP/Mg prior to next visit  -     Basic Metabolic Panel; Future  -     Magnesium; Future  Hypokalemia  See #1    Diabetes mellitus type 2, diet-controlled (HCC)  Off meds after gastric bypass 02/2018  Monitoring      Mild intermittent asthma, unspecified whether complicated  Controlled  Cont prn albuterol      Pure hypercholesterolemia   off statin after wt loss/gastric bypass 2019  Monitoring    BMI 35  S/p gastric bypass    Vitamin D deficiency   s/p gastric bypass  Cont 50 k weekly  Monitoring    S/p gastric bypass  W/ dumping syndrome/symptoms of dehydration/hypoglycemia erratic  Advised increase hydration, fiber,  protein on each meal    Syncope  Ongoing evaluation, upcoming EEG and event monitor  Following neuro/cardiology  monitoring    Needs flu shot  -     Influenza, FLUAD, (age 17 y+), IM, Preservative Free, 0.5 mL      Other orders  -     spironolactone (ALDACTONE) 50 MG tablet; Take 1 tablet by mouth daily        Ff-up in 3 months, plan on MWV then          Patient understands plan of care. Patient has provided input and agrees with goals.

## 2022-03-20 NOTE — Progress Notes (Signed)
[facility]    Cheryl Paul is 71 y.o. female     Patient is here today for cardiac evaluation  Denies prior history of MI or CHF   Patient was sent to me for evaluation of possible syncope  According to patient last month patient was at a facility where she was playing bingo.  Suddenly patient felt hot and flushed and felt sluggish and had to rest his body on a table however she believes that she did not lose consciousness and she was able to hear some surrounding individuals.  However she found herself urinating and having bowel movement unintentional.  Since then patient had MRI and echocardiogram done which has been unremarkable.  She was seen by neurology team and EEG has been scheduled to be done next week    Patient had similar kind of symptoms probably in 2018 after having gastric bypass surgery other than that patient has not been having any palpitation, chest pain chest tightness shortness of breath or any other cardiac symptoms.  She does not have any significant palpitation.  She is able to do activity of living without any limitations.  Denies any nausea, vomiting, abdominal pain, fever, chills, sputum production. No hematuria or other bleeding complaints    Past Medical History:   Diagnosis Date    Arthritis     knee, arms,     Asthma     resolved after gastric bypass    Chronic obstructive pulmonary disease (HCC)     no meds, since gastric bypass    Chronic pain     Diabetes (HCC)     no meds    H/O gastric bypass 2018    H/O sinusitis     Hx SBO     multiple surgeries prior to 2002    Hypercholesterolemia     Hypertension     Sleep apnea     NOT USING     Stress incontinence     Thrombocytopenia (HCC)     Toe fracture, left August 2015    4th toe       Review of Systems:  Cardiac symptoms as noted above in HPI. All others negative.  Denies fatigue, malaise, skin rash, joint pain, blurring vision, photophobia, neck pain, hemoptysis,  chronic cough, nausea, vomiting, hematuria, burning micturition, BRBPR, chronic headaches.    Current Outpatient Medications   Medication Sig    HYDROcodone-acetaminophen (NORCO) 5-325 MG per tablet Take 1 tablet by mouth every 6 hours as needed for Pain. Max Daily Amount: 4 tablets    spironolactone (ALDACTONE) 25 MG tablet TAKE 1 TABLET EVERY DAY    aspirin 81 MG EC tablet Take 1 tablet by mouth 2 times daily    calcium citrate (CALCITRATE) 950 (200 Ca) MG tablet Take 1 tablet by mouth daily    cyanocobalamin 1000 MCG tablet Take 1 tablet by mouth daily    ergocalciferol (ERGOCALCIFEROL) 1.25 MG (50000 UT) capsule Take 1 capsule by mouth every 7 days    magnesium oxide (MAG-OX) 400 MG tablet Take 1 tablet by mouth daily    nystatin (MYCOSTATIN) 100000 UNIT/GM cream Apply topically 2 times daily    potassium chloride (KLOR-CON M) 20 MEQ extended release tablet Take 2 tablets by mouth 3 times daily    thiamine 100 MG tablet Take 1 tablet by mouth daily     No current facility-administered medications for this visit.       Past Surgical History:   Procedure Laterality Date  CARPAL TUNNEL RELEASE Bilateral     CHOLECYSTECTOMY      GASTRIC BYPASS SURGERY  02/21/2018    GYN      HERNIA REPAIR      abdomen    HYSTERECTOMY (CERVIX STATUS UNKNOWN)      tubes tied    ORTHOPEDIC SURGERY      catrpal tunnel right and left    POLYPECTOMY  04/18/2014    PR UNLISTED PROCEDURE ABDOMEN PERITONEUM & OMENTUM      4 surgeries for blockages       Allergies and Sensitivities:  Allergies   Allergen Reactions    Latex Rash    Acetaminophen-Codeine Nausea Only and Other (See Comments)     Nausea and pains in stomach    Codeine Other (See Comments)     Nausea and abd pain    Buprenorphine Rash    Iodine Rash    Lactose Diarrhea     Lactose intolerance    Penicillins Itching    Seasonal Hives     Conductive electrodes       Family History:  Family History   Problem Relation Age of Onset    Rheum Arthritis Mother     Cancer Father          colon polpe    Thyroid Disease Mother     Diabetes Mother     Stroke Mother     Hypertension Mother     Cancer Maternal Aunt        Social History:  Social History     Tobacco Use    Smoking status: Never    Smokeless tobacco: Never   Vaping Use    Vaping Use: Never used   Substance Use Topics    Alcohol use: Never    Drug use: Never     She  reports that she has never smoked. She has never used smokeless tobacco.  She  reports no history of alcohol use.    Physical Exam:  BP Readings from Last 3 Encounters:   03/20/22 (!) 154/90   02/18/22 134/86   02/04/22 134/86         Pulse Readings from Last 3 Encounters:   03/20/22 82   02/04/22 66   12/16/21 70          Wt Readings from Last 3 Encounters:   03/20/22 93 kg (205 lb)   02/18/22 91.2 kg (201 lb)   02/17/22 91.2 kg (201 lb)       Constitutional: Oriented to person, place, and time.   HENT: Head: Normocephalic and atraumatic. Eyes: Conjunctivae and extraocular motions are normal.   Neck: No JVD present. Carotid bruit is not appreciated.   Cardiovascular: Regular rhythm.   No murmur, gallop or rubs appreciated  Lung: Breath sounds normal. No respiratory distress. No ronchi or rales appreciated  Abdominal: No tenderness. No rebound and no guarding.   Musculoskeletal: There is no lower extremity edema. No cynosis  Lymphadenopathy:  No cervical or supraclavicular adenopathy appriciated.   Neurological: No gross motor deficit noted.  Skin: No visible skin rash noted.   No Ear discharge noted  Psychiatric: Normal mood and affect.     LABS:   @  Lab Results   Component Value Date/Time    WBC 6.6 02/04/2022 10:26 AM    HGB 13.3 02/04/2022 10:26 AM    HCT 42.6 02/04/2022 10:26 AM    PLT 146 02/04/2022 10:26 AM    MCV 89.5 02/04/2022 10:26  AM     Lab Results   Component Value Date/Time    NA 142 03/06/2022 08:38 AM    K 4.5 03/06/2022 08:38 AM    CL 107 03/06/2022 08:38 AM    CO2 32 03/06/2022 08:38 AM    BUN 12 03/06/2022 08:38 AM    CREATININE 0.64 03/06/2022 08:38 AM     GLUCOSE 108 03/06/2022 08:38 AM    GLUCOSE 107 12/10/2021 10:40 AM    CALCIUM 9.0 03/06/2022 08:38 AM           Latest Ref Rng & Units 02/04/2022    10:26 AM 12/10/2021    10:40 AM 07/24/2021     8:08 AM 04/18/2021     8:40 AM 12/20/2020     8:40 AM   Lipids   Chol 110 - 200 mg/dL  573       HDL >=22 mg/dL  61       LDL Calc 50 - 99 mg/dL  87       VLDL Calc 8 - 30 mg/dL  18       Trig 40 - 025 mg/dL  89       Ratio 0.0 - 5.0  2.7       ALT 13 - 56 U/L 22  14  22  23  18     AST 10 - 38 U/L 15  12  15  17  16       Lab Results   Component Value Date/Time    ALT 22 02/04/2022 10:26 AM     Hemoglobin A1C   Date Value Ref Range Status   12/10/2021 5.8 (H) 4.8 - 5.6 % Final     Lab Results   Component Value Date    TSH 3.33 05/15/2018       ECHO (TTE) COMPLETE (PRN CONTRAST/BUBBLE/STRAIN/3D) 02/18/2022  6:49 PM (Final)    Interpretation Summary    Study Details: Technical qualifier - other: Technically difficult due to sensitivity in apical region.    Left Ventricle: Normal left ventricular systolic function with a visually estimated EF of 55 - 60%. Left ventricle size is normal. Normal wall thickness. Normal wall motion. Normal diastolic function.    Tricuspid Valve: Unable to assess RVSP due to inadequate or insignificant tricuspid regurgitation.    IMPRESSION & PLAN:  DELPHIA KAYLOR  is 71 y.o. female with    Possible syncope:  Episode happened in 02/2022 while playing bingo.  Patient suddenly became hot flushed and had a bowel and bladder incontinence however she did not think that she had loose consciousness and she was able to hear other individuals and surrounding  Echo in 02/2022: Low risk, normal EF, no major valvular heart disease  MRI in 01/2022: Chronic microvascular disease but no otherwise abnormality  Seeing neurology team and EEG has been scheduled to be done next week  We will place event monitor for 30-day.  Cannot rule out vagal episode completely    Hypertension: Currently on spironolactone 25 mg daily.  Salt  restriction and weight loss advised    Obesity: Status post gastric bypass surgery in 2018.  Maximum weight was 300 pounds.    This plan was discussed with patient who is in agreement.    Thank you for allowing me to participate in patient care. Please feel free to call me if you have any question or concern.     02/2022, MD  Please note: This document has been produced using voice recognition  software. Unrecognized errors in transcription may be present.

## 2022-03-20 NOTE — Progress Notes (Signed)
1. Have you been to the ER, urgent care clinic since your last visit?  Hospitalized since your last visit?No    2. Have you seen or consulted any other health care providers outside of the [facility] since your last visit?  Include any pap smears or colon screening.   Dr.S.Patel    Fluad 0.5 ml given IM in left deltoid. Lot # [id number], exp date 11/15/2022. Patient tolerated injection well. No adverse reaction noted.

## 2022-03-23 ENCOUNTER — Ambulatory Visit: Admit: 2022-04-06 | Discharge: 2022-04-14 | Payer: MEDICARE | Primary: Family Medicine

## 2022-03-23 DIAGNOSIS — I10 Essential (primary) hypertension: Secondary | ICD-10-CM

## 2022-03-30 NOTE — Telephone Encounter (Signed)
Patient called stating that she cannot use the patches for the event monitor and she tried to use the circular patches and still had a reaction.

## 2022-04-13 ENCOUNTER — Ambulatory Visit (HOSPITAL_BASED_OUTPATIENT_CLINIC_OR_DEPARTMENT_OTHER): Payer: Medicare HMO | Admitting: Psychiatry

## 2022-04-13 ENCOUNTER — Encounter (HOSPITAL_COMMUNITY): Payer: Self-pay | Admitting: Psychiatry

## 2022-04-13 VITALS — BP 111/73 | HR 89 | Temp 97.9°F | Ht 65.0 in | Wt 145.8 lb

## 2022-04-13 DIAGNOSIS — F330 Major depressive disorder, recurrent, mild: Secondary | ICD-10-CM

## 2022-04-13 MED ORDER — ESCITALOPRAM OXALATE 5 MG PO TABS: 5.0000 mg | ORAL_TABLET | Freq: Every day | ORAL | 0 refills | Status: DC

## 2022-04-13 MED ORDER — DULOXETINE HCL 30 MG PO CPEP: 30.0000 mg | ORAL_CAPSULE | Freq: Every day | ORAL | 0 refills | Status: DC

## 2022-04-13 MED ORDER — ESCITALOPRAM OXALATE 10 MG PO TABS: 10.0000 mg | ORAL_TABLET | Freq: Every day | ORAL | 2 refills | Status: DC

## 2022-04-13 MED ORDER — TRAZODONE HCL 50 MG PO TABS: 75.0000 mg | ORAL_TABLET | Freq: Every day | ORAL | 2 refills | Status: DC

## 2022-04-13 NOTE — Progress Notes (Signed)
BH MD/PA/NP OP Progress Note  04/13/2022 12:35 PM Brittany [name]  MRN:  [id number]  Visit Diagnosis:    ICD-10-CM   1. MDD (major depressive disorder), recurrent episode, mild (HCC)  F33.0 DULoxetine (CYMBALTA) 30 MG capsule    escitalopram (LEXAPRO) 5 MG tablet    escitalopram (LEXAPRO) 10 MG tablet    traZODone (DESYREL) 50 MG tablet      Assessment: Brittany [name] is a 71 y.o. y.o. female with a history of MDD who presented to [place] [facility] for initial evaluation on 02/11/22.  Patient had followed with Dr. Casimiro Needle in the past.   At initial evaluation patient reported experiencing increased mood lability with periods of depressed mood, tearfulness, decreased energy secondary to interpersonal stressors most often around her son.  Patient denied any SI/HI or thoughts of self-harm.  She denied any paranoia, delusions, AVH, or manic symptoms.  She also endorsed experiencing periods of increased anger out of proportion to triggers.  During which time she turns the anger internal and does not act out verbally or physically.  Patient felt that since decreasing Celexa 3 years ago she has been unstable.  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  It also showed mild global parenchymal volume loss and chronic white matter microangiopathy.   Brittany [name] presents for follow-up evaluation. Today, 04/13/22, patient reports worsening mood over the past month.  She has had increased fatigue, weight gain, low mood, and difficulty with sleep.  Of note patient's symptoms appear to have some relation to interpersonal stressors.  Patient continues to have difficulties with her youngest son and was encouraged to discuss this with him.  She was also encouraged to engage in more social interactions such as a part-time job, volunteering, or engagement with group such as a Estate manager/land agent.   Regarding medications patient has noticed weight gain after starting Cymbalta.  We will cross taper off of Cymbalta and back onto Lexapro.  Also discontinue Celexa and increase trazodone to 75 mg at bedtime to help with insomnia.  Will continue Tranxene at his current dose and consider further taper at our next appointment.  Plan:  - Restart Lexapro 5 mg QD and increase to 10 mg in 1 week - Taper Cymbalta to 30 mg daily for 1 week before discontinuing, due to weight gain - Increase Trazodone 75 mg QHS  - Continue Clorazepate to 1.875 QAM and 3.75 mg QHS for anxiety and depression plan to taper in the future - Discontinued Celexa  - Pregabalin 50 mg at bedtime for small fiber neuropathy, managed by her neurologist - Labs reviewed, TSH WNL - Brittany [name] is her therapist - had went to a grief group, not a good fit. Thinking about a divorce group - Collateral obtained from Dr. Casimiro Needle    Chief Complaint:  Chief Complaint  Patient presents with   Follow-up   HPI: Brittany [name] presents reporting that she has not been doing too good over the past month.  She notes that her mood has been a bit worse, she has had more difficulty with sleep, she has felt more fatigued, and she has started to notice weight gain since starting Cymbalta.  Patient is unsure if the changes in her mood symptoms are secondary to the medication changes or what is going on her life.  She notes that her son continues to be her biggest  problem.  The cruise for her granddaughter's birthday is this week and Brittany [name] still upset with the lack of communication from her son in regards to this.  There was the initial instance of him not telling her about the cruise other than the a passing comment 6 months earlier.  He also waited till the day before they left to ask Brittany [name] to take care of the animals.  She was unhappy with the way that she was asked and the lack of compensation for having to drive over and care for them several times a day.   Overall Brittany [name] feels like his lack of communication and keeping her in the loop has been negatively impacting her mental health.  There was another interpersonal incident with a friend whose's nephew passed away unexpectedly.  Brittany [name] had asked to be invited to the funeral however was not.  We discussed the benefit of having a conversation with her son about wanting to be kept in the loop.  Patient agreed that this was a good idea and is planning to have on when he gets back from his trip.  Brittany [name] also voiced the idea of getting a part-time job to help give her a sense of purpose.  We agreed that a part-time job, volunteering, or participating in the senior center could all be good options for her.  She will continue to look into these.  Regarding her medications patient notes that there has been some improvement in the pain with her Cymbalta but not enough to balance out the weight gain she has noticed.  Brittany [name] also notes having decreased her Tranxene without any adverse side effects.  We discussed the Cymbalta and patient opted to discontinue this in favor of restarting the Lexapro again.   Past Psychiatric History: Patient reports first experiencing symptoms in 1998 with a final diagnosis coming in 1999.  She was voluntarily hospitalized in 2000 with severe depression.  She denies any active SI but does note passive SI with no intent or plan at that time.  Patient saw Dr. Mariel Kansky, Dr. Marvel Plan, and Dr. Casimiro Needle during which time she was prescribed Depakote, Celexa, nortriptyline, trazodone, and clorazepate.  She reports being stable until 2020 when Celexa was decreased to 20 mg.  Cymbalta was started and then discontinued due to weight gain.  Started Lexapro on 04/13/2022  Patient denies any history of substance use.  Past Medical History:  Past Medical History:  Diagnosis Date   Allergic genetic state    Anemia    Anxiety    Arthritis    Cataract    Chicken pox    Clostridioides difficile  infection [id number]/[id number]   Complication of anesthesia    Depression    Family history of adverse reaction to anesthesia    mother - PONV   GERD (gastroesophageal reflux disease)    H/O hiatal hernia    History of GI bleed    Hypercholesteremia    Hyperlipidemia    Hypothyroidism    Osteoporosis    PONV (postoperative nausea and vomiting)    Stroke ([facility]) 11/2017   Thyroid disease    Wears dentures    partial upper, full lower    Past Surgical History:  Procedure Laterality Date   ABDOMINAL HYSTERECTOMY  05/18/2000   total   BRAVO [place] STUDY  05/26/2012   Procedure: BRAVO [place] STUDY;  Surgeon: Lear Ng, MD;  Location: [facility] ENDOSCOPY;  Service: Endoscopy;  Laterality: N/A;   CATARACT EXTRACTION     CHOLECYSTECTOMY  05/18/2004   COLONOSCOPY WITH PROPOFOL N/A 10/24/2018   Procedure: COLONOSCOPY WITH PROPOFOL;  Surgeon: Manya Silvas, MD;  Location: [facility] ENDOSCOPY;  Service: Endoscopy;  Laterality: N/A;   COLONOSCOPY WITH PROPOFOL N/A 08/14/2021   Procedure: COLONOSCOPY WITH PROPOFOL;  Surgeon: Annamaria Helling, DO;  Location: [facility] ENDOSCOPY;  Service: Gastroenterology;  Laterality: N/A;   detatched retina surgery     ESOPHAGOGASTRODUODENOSCOPY  05/26/2012   Procedure: ESOPHAGOGASTRODUODENOSCOPY (EGD);  Surgeon: Lear Ng, MD;  Location: [facility] END[facility];  Service: Endoscopy;  Laterality: N/A;   ESOPHAGOGASTRODUODENOSCOPY  05/30/2012   Procedure: ESOPHAGOGASTRODUODENOSCOPY (EGD);  Surgeon: Cleotis Nipper, MD;  Location: [facility] END[facility];  Service: Endoscopy;  Laterality: N/A;   ESOPHAGOGASTRODUODENOSCOPY N/A 08/31/2012   Procedure: ESOPHAGOGASTRODUODENOSCOPY (EGD);  Surgeon: Wonda Horner, MD;  Location: [facility] ENDOSCOPY;  Service: Endoscopy;  Laterality: N/A;   ESOPHAGOGASTRODUODENOSCOPY (EGD) WITH PROPOFOL N/A 10/24/2018   Procedure: ESOPHAGOGASTRODUODENOSCOPY (EGD) WITH PROPOFOL;  Surgeon: Manya Silvas, MD;  Location: [facility] ENDOSCOPY;  Service: Endoscopy;  Laterality:  N/A;   ESOPHAGOGASTRODUODENOSCOPY (EGD) WITH PROPOFOL N/A 08/05/2020   Procedure: ESOPHAGOGASTRODUODENOSCOPY (EGD) WITH PROPOFOL;  Surgeon: [place], Benay Pike, MD;  Location: [facility] ENDOSCOPY;  Service: Gastroenterology;  Laterality: N/A;   ESOPHAGOGASTRODUODENOSCOPY (EGD) WITH PROPOFOL N/A 08/14/2021   Procedure: ESOPHAGOGASTRODUODENOSCOPY (EGD) WITH PROPOFOL;  Surgeon: Annamaria Helling, DO;  Location: [place];  Service: Gastroenterology;  Laterality: N/A;   EYE SURGERY     FOOT SURGERY Right    HIP SURGERY  1970s   "hip sunk in"   left eye surgery for detached retina     LIVER BIOPSY  2007   NECK SURGERY  05/18/2010   PLANTAR FASCIA RELEASE Left 10/10/2020   Procedure: ENDOSCOPIC PLANTAR FASC. RELEASE;  Surgeon: [name], DPM;  Location: [place];  Service: Podiatry;  Laterality: Left;  ANESTHESIA- CHOICE   ROTATOR CUFF REPAIR  03/18/2012   right side   THYROID SURGERY     nodule removed, right side   TMJ ARTHROPLASTY  05/18/1990   TUBAL LIGATION  05/19/1979    Family Psychiatric History: Both of her kids see psychiatrists for ADHD  Family History:  Family History  Problem Relation Age of Onset   Hypertension Mother    Kidney disease Mother    Heart attack Mother    Osteoporosis Mother    Lung cancer Father    Cancer Sister    Leukemia Sister    Colon polyps Sister    Ovarian cancer Paternal Aunt    Ovarian cancer Maternal Grandmother     Social History:  Social History   Socioeconomic History   Marital status: Divorced    Spouse name: Not on file   Number of children: 2   Years of education: Not on file   Highest education level: Not on file  Occupational History   Not on file  Tobacco Use   Smoking status: Former    Packs/day: 1.50    Years: 20.00    Total pack years: 30.00    Types: Cigarettes    Quit date: 02/05/2008    Years since quitting: 14.1   Smokeless tobacco: Never  Vaping Use   Vaping Use: Never used  Substance and  Sexual Activity   Alcohol use: No   Drug use: No   Sexual activity: Not on file  Other Topics Concern   Not on file  Social History Narrative   Left Handed    Lives in a townhouse    Social Determinants of  Health   Financial Resource Strain: Not on file  Food Insecurity: Not on file  Transportation Needs: Not on file  Physical Activity: Not on file  Stress: Not on file  Social Connections: Not on file    Allergies:  Allergies  Allergen Reactions   Lithium Anaphylaxis    Other reaction(s): anaphylaxis   Morphine Sulfate     Other reaction(s): nausea and vomiting   Fentanyl Other (See Comments)    Extreme Sedation Other reaction(s): Lethargy (intolerance) Extreme sedation  Other reaction(s): extreme sedation   Morphine Nausea And Vomiting and Nausea Only    Other reaction(s): Other (See Comments)   Pseudoephedrine Hypertension    Other reaction(s): Hypertension (intolerance), Other (See Comments), Other (See Comments) Hypertension Other reaction(s): Hypertension (intolerance)    Pseudoephedrine Hcl Nausea Only    Other reaction(s): hypertension, Other (See Comments)    Current Medications: Current Outpatient Medications  Medication Sig Dispense Refill   atorvastatin (LIPITOR) 40 MG tablet Take 40 mg by mouth daily.     betamethasone dipropionate 0.05 % cream Apply topically daily.     brimonidine (ALPHAGAN) 0.15 % ophthalmic solution SMARTSIG:In Eye(s)     Cholecalciferol 50 MCG (2000 UT) TABS Take 2,000 Units by mouth daily.     clorazepate (TRANXENE) 3.75 MG tablet Take 7.5 mg by mouth See admin instructions. Take 1 tablet (3.'75mg'$ ) by mouth twice daily - may take 1 additional tablet (3.'75mg'$ ) by mouth at midday if needed     DULoxetine (CYMBALTA) 30 MG capsule Take 1 capsule (30 mg total) by mouth daily for 7 days. 7 capsule 0   escitalopram (LEXAPRO) 10 MG tablet Take 1 tablet (10 mg total) by mouth daily. Start in 7 days after taking 5 mg dose for 7 days 30  tablet 2   escitalopram (LEXAPRO) 5 MG tablet Take 1 tablet (5 mg total) by mouth daily for 7 days. 7 tablet 0   levothyroxine (SYNTHROID, LEVOTHROID) 25 MCG tablet Take 25 mcg by mouth every morning.      montelukast (SINGULAIR) 10 MG tablet Take 10 mg by mouth at bedtime.     pantoprazole (PROTONIX) 40 MG tablet Take 40 mg by mouth daily.     pregabalin (LYRICA) 50 MG capsule Take 1 capsule (50 mg total) by mouth at bedtime. 30 capsule 5   timolol (TIMOPTIC) 0.5 % ophthalmic solution SMARTSIG:In Eye(s)     traZODone (DESYREL) 50 MG tablet Take 1.5 tablets (75 mg total) by mouth at bedtime. 45 tablet 2   vitamin B-12 (CYANOCOBALAMIN) 1000 MCG tablet Take 1,000 mcg by mouth daily.     No current facility-administered medications for this visit.     Musculoskeletal: Strength & Muscle Tone: within normal limits Gait & Station: normal Patient leans: N/A  Psychiatric Specialty Exam: Review of Systems  Blood pressure 111/73, pulse 89, temperature 97.9 F (36.6 C), temperature source Oral, height '5\' 5"'$  (1.651 m), weight 145 lb 12.8 oz (66.1 kg).Body mass index is 24.26 kg/m.  General Appearance: Well Groomed  Eye Contact:  Good  Speech:  Clear and Coherent and Normal Rate  Volume:  Normal  Mood:  Depressed and Irritable  Affect:  Congruent  Thought Process:  Coherent and Goal Directed  Orientation:  Full (Time, Place, and Person)  Thought Content: Logical   Suicidal Thoughts:  No  Homicidal Thoughts:  No  Memory:  NA  Judgement:  Good  Insight:  Fair  Psychomotor Activity:  Normal  Concentration:  Concentration: Good  Recall:  Good  Fund of Knowledge: Good  Language: Good  Akathisia:  NA    AIMS (if indicated): not done  Assets:  Communication Skills Desire for Improvement Housing Leisure Time Resilience Transportation  ADL's:  Intact  Cognition: WNL  Sleep:  Fair   Metabolic Disorder Labs: Lab Results  Component Value Date   HGBA1C 5.7 (H) 11/28/2017   MPG  116.89 11/28/2017   No results found for: "PROLACTIN" Lab Results  Component Value Date   CHOL 180 11/28/2017   TRIG 128 11/28/2017   HDL 31 (L) 11/28/2017   CHOLHDL 5.8 11/28/2017   VLDL 26 11/28/2017   LDLCALC 123 (H) 11/28/2017   Lab Results  Component Value Date   TSH 1.180 08/19/2021   TSH 4.830 (H) 08/31/2012    Therapeutic Level Labs: No results found for: "LITHIUM" No results found for: "VALPROATE" No results found for: "CBMZ"   Screenings: [place] Office Visit from 02/11/2022 in [facility]  PHQ-2 Total Score 1      Flowsheet Row Admission (Discharged) from 08/14/2021 in [place] ED from 05/30/2021 in [place] Admission (Discharged) from 10/10/2020 in [place] No Risk No Risk No Risk       Collaboration of Care: Collaboration of Care: Medication Management AEB medication prescription  Patient/Guardian was advised Release of Information must be obtained prior to any record release in order to collaborate their care with an outside provider. Patient/Guardian was advised if they have not already done so to contact the registration department to sign all necessary forms in order for [place] to release information regarding their care.   Consent: Patient/Guardian gives verbal consent for treatment and assignment of benefits for services provided during this visit. Patient/Guardian expressed understanding and agreed to proceed.    Vista Mink, MD 04/13/2022, 12:35 PM

## 2022-05-05 ENCOUNTER — Telehealth (HOSPITAL_COMMUNITY): Payer: Self-pay | Admitting: *Deleted

## 2022-05-05 NOTE — Telephone Encounter (Signed)
Pt of Dr. Nelida Gores called stating that she had a "meltdown" yesterday and has been feeling depressed. Pt denies SI, HI, thoughts of self harm, but endorses increased crying spells and decreased sleep. Pt anxiety level increased as well she says. Pt is currently taking Lexapro 10 mg qd. Cymbalta and Celexa have been d/c and Trazodone increased to 75 mg as of 04/13/22 visit. Pt has an appointment scheduled for 06/15/22 but feels she needs medication adjustment prior to that visit. Please review and advise.

## 2022-05-06 ENCOUNTER — Telehealth (HOSPITAL_COMMUNITY): Payer: Self-pay | Admitting: Psychiatry

## 2022-05-06 NOTE — Telephone Encounter (Signed)
This Probation officer is covering clinical needs for Dr. Nelida Gores while out on leave. Received message from clinical staff that patient had called clinic reporting a meltdown and feeling depressed, requesting to talk to provider about current medications and possible need for adjustment.   This Probation officer called patient back next day and spoke to her on the phone for approx. 10 minutes. Patient reports she had a total "meltdown" yesterday which she describes as bursting into tears and feeling overwhelmed in the setting of numerous stressors - notes that two dear friends recently passed away and that she has faced multiple interpersonal/familial and medical stressors. Denies passive/active SI or feelings of hopelessness at the time or currently. Denies HI, AVH.   Reports that at last visit on 04/13/22 she was restarted on Lexapro; has been taking 10 mg dose for about 2 weeks now. Denies adverse effects.   She reports that she saw her therapist this morning and found it helpful to process recent stressors. Will be meeting with therapist again at the end of January. She feels that once she gets through the holidays, symptoms will likely improve and is comfortable remaining on current regimen as prescribed. Encouraged to reach back out to clinic if future needs/concerns arise.  Alda Berthold, MD 05/06/22

## 2022-05-06 NOTE — Telephone Encounter (Signed)
This patient contacted office for the following prescriptions to be filled:    Medication requested : ergocalciferol (ERGOCALCIFEROL) 1.25 MG (50000 UT) capsule  Qty 12  PCP: [place] or Print: [facility]  Mail order or Local pharmacy Mail Order     Scheduled appointment if not seen by current providers in office: LOV  03/20/2022 FU 06/22/2022

## 2022-05-06 NOTE — Telephone Encounter (Signed)
This patient contacted the office for the following prescriptions to be refilled:    Medication requested :   Requested Prescriptions      No prescriptions requested or ordered in this encounter      LAST REFILLED: 05/05/2021  PCP: Mable Fill, MD  LOV: 03/20/2022  NOV: 06/22/2022  FUTURE APPT:   Future Appointments   Date Time Provider Department Center   06/22/2022 10:15 AM Mable Fill, MD [facility] BS AMB   12/21/2022  9:15 AM Tretha Sciara, MD [facility] BS AMB     LABS:   Results for orders placed or performed during the hospital encounter of 03/06/22 (from the past 2160 hour(s))   Basic Metabolic Panel   Result Value Ref Range    Sodium 142 136 - 145 mmol/L    Potassium 4.5 3.5 - 5.5 mmol/L    Chloride 107 100 - 111 mmol/L    CO2 32 21 - 32 mmol/L    Anion Gap 3 3.0 - 18 mmol/L    Glucose 108 (H) 74 - 99 mg/dL    BUN 12 7.0 - 18 MG/DL    Creatinine 2.95 0.6 - 1.3 MG/DL    Bun/Cre Ratio 19 12 - 20      Est, Glom Filt Rate >60 >60 ml/min/1.62m2    Calcium 9.0 8.5 - 10.1 MG/DL   Results for orders placed or performed during the hospital encounter of 02/18/22 (from the past 2160 hour(s))   Echo complete (with contrast/ bubble/ strain/ 3D PRN)   Result Value Ref Range    Body Surface Area 2.01 m2    IVSd 0.7 0.6 - 0.9 cm    LVIDd 4.7 3.9 - 5.3 cm    LVIDs 3.0 cm    LVOT Diameter 2.0 cm    LVPWd 0.7 0.6 - 0.9 cm    LVOT Peak Gradient 3 mmHg    LVOT Mean Gradient 1 mmHg    LVOT SV 57.1 ml    LVOT Peak Velocity 0.8 m/s    LVOT VTI 18.2 cm    RV Basal Dimension 4.0 cm    LA Volume A/L 59 mL    LA Volume A-L A4C 35 22 - 52 mL    LA Volume A-L A4C 79 (A) 22 - 52 mL    LA Volume BP 54 (A) 22 - 52 mL    MV A Velocity 0.43 m/s    MV E Wave Deceleration Time 161.9 ms    MV E Velocity 0.80 m/s    LV E' Lateral Velocity 9 cm/s    LV E' Septal Velocity 9 cm/s    TR Peak Gradient 25 mmHg    TR Max Velocity 2.49 m/s    Ascending Aorta 3.2 cm    Aortic Root 2.8 cm    Fractional Shortening 2D 36 28 - 44 %    LVIDd Index 2.42 cm/m2     LVIDs Index 1.55 cm/m2    LV RWT Ratio 0.30     LV Mass 2D 103.1 67 - 162 g    LV Mass 2D Index 53.1 43 - 95 g/m2    MV E/A 1.86     E/E' Ratio (Averaged) 8.89     E/E' Lateral 8.89     E/E' Septal 8.89     LA Volume Index BP 28 16 - 34 ml/m2    LA Volume Index A/L 30 16 - 34 mL/m2    LVOT Stroke Volume Index 29.5 mL/m2  LVOT Area 3.1 cm2    Ao Root Index 1.44 cm/m2    Ascending Aorta Index 1.65 cm/m2    LA Volume Index A-L A2C 18 16 - 34 mL/m2    LA Volume Index A-L A4C 41 (A) 16 - 34 mL/m2    Est. RA Pressure 3 mmHg    RVSP 28 mmHg   Results for orders placed or performed during the hospital encounter of 02/17/22 (from the past 2160 hour(s))   MRI BRAIN WO CONTRAST    Impression    1.  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

## 2022-05-19 ENCOUNTER — Inpatient Hospital Stay: Admit: 2022-05-19 | Payer: MEDICARE | Primary: Family Medicine

## 2022-05-19 DIAGNOSIS — I10 Essential (primary) hypertension: Secondary | ICD-10-CM

## 2022-05-19 LAB — BASIC METABOLIC PANEL
Anion Gap: 3 mmol/L (ref 3.0–18)
BUN: 13 MG/DL (ref 7.0–18)
Bun/Cre Ratio: 23 — ABNORMAL HIGH (ref 12–20)
CO2: 28 mmol/L (ref 21–32)
Calcium: 9 MG/DL (ref 8.5–10.1)
Chloride: 111 mmol/L (ref 100–111)
Creatinine: 0.56 MG/DL — ABNORMAL LOW (ref 0.6–1.3)
Est, Glom Filt Rate: >60 mL/min/{1.73_m2} (ref >60)
Glucose: 67 mg/dL — ABNORMAL LOW (ref 74–99)
Potassium: 4 mmol/L (ref 3.5–5.5)
Sodium: 142 mmol/L (ref 136–145)

## 2022-05-19 LAB — MAGNESIUM: Magnesium: 1.7 mg/dL (ref 1.6–2.6)

## 2022-05-20 ENCOUNTER — Ambulatory Visit: Payer: MEDICARE | Attending: Student in an Organized Health Care Education/Training Program | Primary: Family Medicine

## 2022-05-20 ENCOUNTER — Encounter: Payer: Self-pay | Admitting: Neurology

## 2022-05-21 NOTE — Telephone Encounter (Signed)
This pharmacy faxed over request for the following prescriptions to be filled:    Medication requested :  ergocalciferol (ERGOCALCIFEROL) 1.25 MG (50000 UT) capsule  PCP:  [place] or Print: [facility]   Mail order or Local pharmacy Mail in    Scheduled appointment if not seen by current providers in office: LOV 03/20/22 F/u 06/22/22

## 2022-05-21 NOTE — Telephone Encounter (Signed)
This patient contacted the office for the following prescriptions to be refilled:    Medication requested :   Requested Prescriptions      No prescriptions requested or ordered in this encounter      LAST REFILLED: 05/05/2021  PCP: Dalene Seltzer, MD  LOV: 03/20/2022  NOV: 06/22/2022  FUTURE APPT:   Future Appointments   Date Time Provider [place]   06/22/2022 10:15 AM Dalene Seltzer, MD [facility] BS AMB   12/21/2022  9:15 AM Leland Her, MD [facility] BS AMB     LABS:   Results for orders placed or performed during the hospital encounter of 05/19/22 (from the past 2160 hour(s))   Basic Metabolic Panel   Result Value Ref Range    Sodium 142 136 - 145 mmol/L    Potassium 4.0 3.5 - 5.5 mmol/L    Chloride 111 100 - 111 mmol/L    CO2 28 21 - 32 mmol/L    Anion Gap 3 3.0 - 18 mmol/L    Glucose 67 (L) 74 - 99 mg/dL    BUN 13 7.0 - 18 MG/DL    Creatinine 0.56 (L) 0.6 - 1.3 MG/DL    Bun/Cre Ratio 23 (H) 12 - 20      Est, Glom Filt Rate >60 >60 ml/min/1.46m2    Calcium 9.0 8.5 - 10.1 MG/DL   Magnesium   Result Value Ref Range    Magnesium 1.7 1.6 - 2.6 mg/dL   Results for orders placed or performed during the hospital encounter of 03/06/22 (from the past 2160 hour(s))   Basic Metabolic Panel   Result Value Ref Range    Sodium 142 136 - 145 mmol/L    Potassium 4.5 3.5 - 5.5 mmol/L    Chloride 107 100 - 111 mmol/L    CO2 32 21 - 32 mmol/L    Anion Gap 3 3.0 - 18 mmol/L    Glucose 108 (H) 74 - 99 mg/dL    BUN 12 7.0 - 18 MG/DL    Creatinine 0.64 0.6 - 1.3 MG/DL    Bun/Cre Ratio 19 12 - 20      Est, Glom Filt Rate >60 >60 ml/min/1.82m2    Calcium 9.0 8.5 - 10.1 MG/DL         Thank you.

## 2022-05-24 MED ORDER — ERGOCALCIFEROL 1.25 MG (50000 UT) PO CAPS
1.25 MG (50000 UT) | ORAL_CAPSULE | ORAL | 1 refills | Status: AC
Start: 2022-05-24 — End: 2022-10-14

## 2022-05-26 ENCOUNTER — Other Ambulatory Visit: Payer: Self-pay | Admitting: Family Medicine

## 2022-05-26 DIAGNOSIS — D329 Benign neoplasm of meninges, unspecified: Secondary | ICD-10-CM

## 2022-05-27 ENCOUNTER — Encounter: Payer: Self-pay | Admitting: Oncology

## 2022-05-28 ENCOUNTER — Other Ambulatory Visit (HOSPITAL_COMMUNITY): Payer: Self-pay | Admitting: Psychiatry

## 2022-05-28 ENCOUNTER — Telehealth (HOSPITAL_COMMUNITY): Payer: Self-pay

## 2022-05-28 DIAGNOSIS — F330 Major depressive disorder, recurrent, mild: Secondary | ICD-10-CM

## 2022-05-28 MED ORDER — CLORAZEPATE DIPOTASSIUM 3.75 MG PO TABS: 3.7500 mg | ORAL_TABLET | ORAL | 0 refills | Status: DC

## 2022-05-28 NOTE — Telephone Encounter (Signed)
Patient called to report that she is almost out of the following medication she is not sure if you were sending in refills please advise Last visit 04/13/22 Next visit 06/15/22  clorazepate (TRANXENE) 3.75 MG tablet   Preferred pharmacy [facility] [place], [place] - [address] Phone: [phone]  Fax: [phone]

## 2022-06-03 MED ORDER — SPIRONOLACTONE 50 MG PO TABS
50 MG | ORAL_TABLET | Freq: Every day | ORAL | 1 refills | Status: AC
Start: 2022-06-03 — End: 2022-11-03

## 2022-06-03 NOTE — Telephone Encounter (Signed)
This patient contacted the office for the following prescriptions to be refilled:    Medication requested :   Requested Prescriptions     Pending Prescriptions Disp Refills    spironolactone (ALDACTONE) 50 MG tablet [Pharmacy Med Name: SPIRONOLACTONE 50 MG Tablet] 90 tablet 3     Sig: TAKE 1 TABLET EVERY DAY      LAST REFILLED: 03/20/2022  PCP: Dalene Seltzer, MD  LOV: 03/20/2022  NOV: 06/22/2022  FUTURE APPT:   Future Appointments   Date Time Provider [place]   06/22/2022 10:15 AM Dalene Seltzer, MD [facility] BS AMB   12/21/2022  9:15 AM Leland Her, MD [facility] BS AMB     LABS:   Results for orders placed or performed during the hospital encounter of 05/19/22 (from the past 2160 hour(s))   Basic Metabolic Panel   Result Value Ref Range    Sodium 142 136 - 145 mmol/L    Potassium 4.0 3.5 - 5.5 mmol/L    Chloride 111 100 - 111 mmol/L    CO2 28 21 - 32 mmol/L    Anion Gap 3 3.0 - 18 mmol/L    Glucose 67 (L) 74 - 99 mg/dL    BUN 13 7.0 - 18 MG/DL    Creatinine 0.56 (L) 0.6 - 1.3 MG/DL    Bun/Cre Ratio 23 (H) 12 - 20      Est, Glom Filt Rate >60 >60 ml/min/1.37m2    Calcium 9.0 8.5 - 10.1 MG/DL   Magnesium   Result Value Ref Range    Magnesium 1.7 1.6 - 2.6 mg/dL   Results for orders placed or performed during the hospital encounter of 03/06/22 (from the past 2160 hour(s))   Basic Metabolic Panel   Result Value Ref Range    Sodium 142 136 - 145 mmol/L    Potassium 4.5 3.5 - 5.5 mmol/L    Chloride 107 100 - 111 mmol/L    CO2 32 21 - 32 mmol/L    Anion Gap 3 3.0 - 18 mmol/L    Glucose 108 (H) 74 - 99 mg/dL    BUN 12 7.0 - 18 MG/DL    Creatinine 0.64 0.6 - 1.3 MG/DL    Bun/Cre Ratio 19 12 - 20      Est, Glom Filt Rate >60 >60 ml/min/1.84m2    Calcium 9.0 8.5 - 10.1 MG/DL         Thank you.

## 2022-06-05 ENCOUNTER — Ambulatory Visit
Admission: RE | Admit: 2022-06-05 | Discharge: 2022-06-05 | Disposition: A | Discharge status: Home / Self Care | Payer: Medicare Other | Source: Ambulatory Visit | Attending: Family Medicine | Admitting: Family Medicine

## 2022-06-05 DIAGNOSIS — D329 Benign neoplasm of meninges, unspecified: Secondary | ICD-10-CM | POA: Insufficient documentation

## 2022-06-05 MED ORDER — GADOBUTROL 1 MMOL/ML IV SOLN
6.0000 mL | Freq: Once | INTRAVENOUS | Status: AC | PRN
  Administered 2022-06-05: 6 mL via INTRAVENOUS

## 2022-06-15 ENCOUNTER — Ambulatory Visit (HOSPITAL_COMMUNITY): Payer: Medicare Other | Admitting: Psychiatry

## 2022-06-15 ENCOUNTER — Encounter (HOSPITAL_COMMUNITY): Payer: Self-pay | Admitting: Psychiatry

## 2022-06-15 VITALS — BP 123/77 | HR 68 | Temp 97.8°F | Ht 65.0 in | Wt 146.0 lb

## 2022-06-15 DIAGNOSIS — F330 Major depressive disorder, recurrent, mild: Secondary | ICD-10-CM

## 2022-06-15 MED ORDER — ESCITALOPRAM OXALATE 10 MG PO TABS: 10.0000 mg | ORAL_TABLET | Freq: Every day | ORAL | 2 refills | Status: DC

## 2022-06-15 MED ORDER — TRAZODONE HCL 50 MG PO TABS: 75.0000 mg | ORAL_TABLET | Freq: Every day | ORAL | 2 refills | Status: DC

## 2022-06-15 MED ORDER — CLORAZEPATE DIPOTASSIUM 3.75 MG PO TABS: 1.8750 mg | ORAL_TABLET | Freq: Two times a day (BID) | ORAL | 0 refills | Status: DC
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
## 2022-06-22 ENCOUNTER — Ambulatory Visit: Admit: 2022-06-22 | Discharge: 2022-06-22 | Payer: MEDICARE | Attending: Family Medicine | Primary: Family Medicine

## 2022-06-22 DIAGNOSIS — Z0000 Encounter for general adult medical examination without abnormal findings: Secondary | ICD-10-CM

## 2022-06-22 NOTE — Patient Instructions (Signed)
Preventing Falls: Care Instructions  Injuries and health problems such as trouble walking or poor eyesight can increase your risk of falling. So can some medicines. But there are things you can do to help prevent falls. You can exercise to get stronger. You can also arrange your home to make it safer.    Talk to your doctor about the medicines you take. Ask if any of them increase the risk of falls and whether they can be changed or stopped.   Try to exercise regularly. It can help improve your strength and balance. This can help lower your risk of falling.     Practice fall safety and prevention.    Wear low-heeled shoes that fit well and give your feet good support. Talk to your doctor if you have foot problems that make this hard.  Carry a cellphone or wear a medical alert device that you can use to call for help.  Use stepladders instead of chairs to reach high objects. Don't climb if you're at risk for falls. Ask for help, if needed.  Wear the correct eyeglasses, if you need them.    Make your home safer.    Remove rugs, cords, clutter, and furniture from walkways.  Keep your house well lit. Use night-lights in hallways and bathrooms.  Install and use sturdy handrails on stairways.  Wear nonskid footwear, even inside. Don't walk barefoot or in socks without shoes.    Be safe outside.    Use handrails, curb cuts, and ramps whenever possible.  Keep your hands free by using a shoulder bag or backpack.  Try to walk in well-lit areas. Watch out for uneven ground, changes in pavement, and debris.  Be careful in the winter. Walk on the grass or gravel when sidewalks are slippery. Use de-icer on steps and walkways. Add non-slip devices to shoes.    Put grab bars and nonskid mats in your shower or tub and near the toilet. Try to use a shower chair or bath bench when bathing.   Get into a tub or shower by putting in your weaker leg first. Get out with your strong side first. Have a phone or medical alert device in  the bathroom with you.   Where can you learn more?  Go to https://www.bennett.info/ and enter G117 to learn more about "Preventing Falls: Care Instructions."  Current as of: July 18, 2023Content Version: 13.9   2006-2023 [employer].   Care instructions adapted under license by [facility]. If you have questions about a medical condition or this instruction, always ask your healthcare professional. [place] any warranty or liability for your use of this information.           Starting a Weight Loss Plan: Care Instructions  Overview     If you're thinking about losing weight, it can be hard to know where to start. Your doctor can help you set up a weight loss plan that best meets your needs. You may want to take a class on nutrition or exercise, or you could join a weight loss support group. If you have questions about how to make changes to your eating or exercise habits, ask your doctor about seeing a registered dietitian or an exercise specialist.  It can be a big challenge to lose weight. But you don't have to make huge changes at once. Make small changes, and stick with them. When those changes become habit, add a few more changes.  If you don't think you're  ready to make changes right now, try to pick a date in the future. Make an appointment to see your doctor to discuss whether the time is right for you to start a plan.  Follow-up care is a key part of your treatment and safety. Be sure to make and go to all appointments, and call your doctor if you are having problems. It's also a good idea to know your test results and keep a list of the medicines you take.  How can you care for yourself at home?  Set realistic goals. Many people expect to lose much more weight than is likely. A weight loss of 5% to 10% of your body weight may be enough to improve your health.  Get family and friends involved to provide support. Talk to them about why you  are trying to lose weight, and ask them to help. They can help by participating in exercise and having meals with you, even if they may be eating something different.  Find what works best for you. If you do not have time or do not like to cook, a program that offers meal replacement bars or shakes may be better for you. Or if you like to prepare meals, finding a plan that includes daily menus and recipes may be best.  Ask your doctor about other health professionals who can help you achieve your weight loss goals.  A dietitian can help you make healthy changes in your diet.  An exercise specialist or personal trainer can help you develop a safe and effective exercise program.  A counselor or psychiatrist can help you cope with issues such as depression, anxiety, or family problems that can make it hard to focus on weight loss.  Consider joining a support group for people who are trying to lose weight. Your doctor can suggest groups in your area.  Where can you learn more?  Go to https://www.bennett.info/ and enter U357 to learn more about "Starting a Weight Loss Plan: Care Instructions."  Current as of: September 20, 2023Content Version: 13.9   2006-2023 [employer], [employer].   Care instructions adapted under license by [facility]. If you have questions about a medical condition or this instruction, always ask your healthcare professional. [place] any warranty or liability for your use of this information.           A Healthy Heart: Care Instructions  Your Care Instructions     Coronary artery disease, also called heart disease, occurs when a substance called plaque builds up in the vessels that supply oxygen-rich blood to your heart muscle. This can narrow the blood vessels and reduce blood flow. A heart attack happens when blood flow is completely blocked. A high-fat diet, smoking, and other factors increase the risk of heart disease.  Your doctor has  found that you have a chance of having heart disease. You can do lots of things to keep your heart healthy. It may not be easy, but you can change your diet, exercise more, and quit smoking. These steps really work to lower your chance of heart disease.  Follow-up care is a key part of your treatment and safety. Be sure to make and go to all appointments, and call your doctor if you are having problems. It's also a good idea to know your test results and keep a list of the medicines you take.  How can you care for yourself at home?  Diet   Use less salt when you cook and  eat. This helps lower your blood pressure. Taste food before salting. Add only a little salt when you think you need it. With time, your taste buds will adjust to less salt.    Eat fewer snack items, fast foods, canned soups, and other high-salt, high-fat, processed foods.    Read food labels and try to avoid saturated and trans fats. They increase your risk of heart disease by raising cholesterol levels.    Limit the amount of solid fat-butter, margarine, and shortening-you eat. Use olive, peanut, or canola oil when you cook. Bake, broil, and steam foods instead of frying them.    Eat a variety of fruit and vegetables every day. Dark green, deep orange, red, or yellow fruits and vegetables are especially good for you. Examples include spinach, carrots, peaches, and berries.    Foods high in fiber can reduce your cholesterol and provide important vitamins and minerals. High-fiber foods include whole-grain cereals and breads, oatmeal, beans, brown rice, citrus fruits, and apples.    Eat lean proteins. Heart-healthy proteins include seafood, lean meats and poultry, eggs, beans, peas, nuts, seeds, and soy products.    Limit drinks and foods with added sugar. These include candy, desserts, and soda pop.   Lifestyle changes   If your doctor recommends it, get more exercise. Walking is a good choice. Bit by bit, increase the amount you walk every  day. Try for at least 30 minutes on most days of the week. You also may want to swim, bike, or do other activities.    Do not smoke. If you need help quitting, talk to your doctor about stop-smoking programs and medicines. These can increase your chances of quitting for good. Quitting smoking may be the most important step you can take to protect your heart. It is never too late to quit.    Limit alcohol to 2 drinks a day for men and 1 drink a day for women. Too much alcohol can cause health problems.    Manage other health problems such as diabetes, high blood pressure, and high cholesterol. If you think you may have a problem with alcohol or drug use, talk to your doctor.   Medicines   Take your medicines exactly as prescribed. Call your doctor if you think you are having a problem with your medicine.    If your doctor recommends aspirin, take the amount directed each day. Make sure you take aspirin and not another kind of pain reliever, such as acetaminophen (Tylenol).   When should you call for help?   Call 911 if you have symptoms of a heart attack. These may include:   Chest pain or pressure, or a strange feeling in the chest.    Sweating.    Shortness of breath.    Pain, pressure, or a strange feeling in the back, neck, jaw, or upper belly or in one or both shoulders or arms.    Lightheadedness or sudden weakness.    A fast or irregular heartbeat.   After you call 911, the operator may tell you to chew 1 adult-strength or 2 to 4 low-dose aspirin. Wait for an ambulance. Do not try to drive yourself.  Watch closely for changes in your health, and be sure to contact your doctor if you have any problems.  Where can you learn more?  Go to https://www.bennett.info/ and enter F075 to learn more about "A Healthy Heart: Care Instructions."  Current as of: June 25, 2023Content Version: 13.9   2006-2023 Healthwise,  Incorporated.   Care instructions adapted under license by [facility]. If you have questions about a medical condition or this instruction, always ask your healthcare professional. [place] any warranty or liability for your use of this information.      Personalized Preventive Plan for Cheryl Paul - 06/22/2022  Medicare offers a range of preventive health benefits. Some of the tests and screenings are paid in full while other may be subject to a deductible, co-insurance, and/or copay.    Some of these benefits include a comprehensive review of your medical history including lifestyle, illnesses that may run in your family, and various assessments and screenings as appropriate.    After reviewing your medical record and screening and assessments performed today your provider may have ordered immunizations, labs, imaging, and/or referrals for you.  A list of these orders (if applicable) as well as your Preventive Care list are included within your After Visit Summary for your review.    Other Preventive Recommendations:    A preventive eye exam performed by an eye specialist is recommended every 1-2 years to screen for glaucoma; cataracts, macular degeneration, and other eye disorders.  A preventive dental visit is recommended every 6 months.  Try to get at least 150 minutes of exercise per week or 10,000 steps per day on a pedometer .  Order or download the FREE "Exercise & Physical Activity: Your Everyday Guide" from [employer]. Call (902)[phone] or search [employer] online.  You need 1200-1500 mg of calcium and 1000-2000 IU of vitamin D per day. It is possible to meet your calcium requirement with diet alone, but a vitamin D supplement is usually necessary to meet this goal.  When exposed to the sun, use a sunscreen that protects against both UVA and UVB radiation with an SPF of 30 or greater. Reapply every 2 to 3 hours or after sweating, drying off with a towel, or swimming.  Always wear a seat belt when  traveling in a car. Always wear a helmet when riding a bicycle or motorcycle.

## 2022-06-22 NOTE — Progress Notes (Signed)
Medicare Annual Wellness Visit    Cheryl Paul is here for Medicare AWV, Hypertension, Diabetes, Asthma, and Cholesterol Problem    Assessment & Plan   Breast cancer screening by mammogram    Medicare annual wellness visit, subsequent    Recommendations for Preventive Services Due: see orders and patient instructions/AVS.  Recommended screening schedule for the next 5-10 years is provided to the patient in written form: see Patient Instructions/AVS.     No follow-ups on file.     Subjective       Patient's complete Health Risk Assessment and screening values have been reviewed and are found in Flowsheets. The following problems were reviewed today and where indicated follow up appointments were made and/or referrals ordered.    Positive Risk Factor Screenings with Interventions:    Fall Risk:  Do you feel unsteady or are you worried about falling? : no  2 or more falls in past year?: (!) yes  Fall with injury in past year?: (!) yes     Interventions:    Reviewed medications, home hazards, visual acuity, and co-morbidities that can increase risk for falls  See AVS for additional education material               Activity, Diet, and Weight:               Body mass index is 36.14 kg/m. (!) Abnormal    Obesity Interventions:  See AVS for additional education material                               Objective   Vitals:    06/22/22 1018   BP: 136/82   Site: Left Upper Arm   Position: Sitting   Cuff Size: Large Adult   Pulse: 78   Resp: 18   Temp: 98 F (36.7 C)   TempSrc: Temporal   SpO2: 98%   Weight: 92.5 kg (204 lb)   Height: 1.6 m (5\' 3" )      Body mass index is 36.14 kg/m.               Allergies   Allergen Reactions    Latex Rash    Acetaminophen-Codeine Nausea Only and Other (See Comments)     Nausea and pains in stomach    Codeine Other (See Comments)     Nausea and abd pain    Buprenorphine Rash    Iodine Rash    Lactose Diarrhea     Lactose intolerance    Penicillins Itching    Seasonal Hives     Conductive  electrodes    Adhesive Tape      Itching, redness     Prior to Visit Medications    Medication Sig Taking? Authorizing Provider   spironolactone (ALDACTONE) 50 MG tablet TAKE 1 TABLET EVERY DAY Yes Aldrin Engelhard, , MD   ergocalciferol (ERGOCALCIFEROL) 1.25 MG (50000 UT) capsule Take 1 capsule by mouth every 7 days Yes Hanley Seamen, MD   aspirin 81 MG EC tablet Take 1 tablet by mouth daily Yes Automatic Reconciliation, Ar   calcium citrate (CALCITRATE) 950 (200 Ca) MG tablet Take 1 tablet by mouth daily Yes Automatic Reconciliation, Ar   cyanocobalamin 1000 MCG tablet Take 1 tablet by mouth daily Yes Automatic Reconciliation, Ar   magnesium oxide (MAG-OX) 400 MG tablet Take 1 tablet by mouth daily Yes Automatic Reconciliation, Ar   nystatin (MYCOSTATIN) 100000  UNIT/GM cream Apply topically 2 times daily Yes Automatic Reconciliation, Ar   potassium chloride (KLOR-CON M) 20 MEQ extended release tablet Take 2 tablets by mouth daily Yes Automatic Reconciliation, Ar   thiamine 100 MG tablet Take 1 tablet by mouth daily Yes Automatic Reconciliation, Ar       CareTeam (Including outside providers/suppliers regularly involved in providing care):   Patient Care Team:  Mable Fill, MD as PCP - General  Sharmon Revere Hanley Seamen, MD as PCP - Empaneled Provider  Derek Jack, RN as Ambulatory Care Manager     Reviewed and updated this visit:  Tobacco  Allergies  Meds  Med Hx  Surg Hx  Soc Hx  Fam Hx           SUBJECTIVE  Ff-up    HTN- currently on aldactone  Hypokalemia- endo eval neg for hyperaldo, currently on aldactone and kcl 40 meqs TID with normal K levels.     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     DM-diet controlled, she does not check glucose at home. She is no longer on metformin.      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 Says weight loss improved respirotary symptoms. denies  cough, sob, wheezing. She says last albuterol use was a year ago. Usually heat triggers respiratory symptoms.    Chronic OA knee pain- now following [facility] pain management, on prn oxycodone dr. Sterling Big    Syncope w/ bowel and bladder incontinence- ongoing evaluation thus far work up unrevealing MRI brain chronic microvascular changes, Echo without significant findings. cardiology eval todayplan for 30 day even monitor. Neuro eval plan for EEG this month.     ROS:  See HPI, all others negative        Patient Active Problem List   Diagnosis Code    Chronic infection of sinus J32.9    Type 2 diabetes mellitus without complication (HCC) E11.9    Incontinence in female R32    Candidal intertrigo B37.2    Arthritis, multiple joint involvement M12.9    Essential hypertension I10    COPD (chronic obstructive pulmonary disease) (HCC) J44.9    Hyperlipidemia E78.5    Vitamin D deficiency E55.9    Internal hemorrhoids K64.8    Colon polyps K63.5    Hypokalemia E87.6    Hypomagnesemia E83.42    Syncope R55    Elevated troponin R77.8    Short gut syndrome K91.2    Hypocalcemia E83.51    Mild intermittent asthma J45.20    Thrombocytopenia, unspecified D69.6    Arthritis of knee M17.10    Non-compliance Z91.199    Arthrofibrosis of knee joint, 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         Current Outpatient Medications:     spironolactone (ALDACTONE) 50 MG tablet, TAKE 1 TABLET EVERY DAY, Disp: 90 tablet, Rfl: 1    ergocalciferol (ERGOCALCIFEROL) 1.25 MG (50000 UT) capsule, Take 1 capsule by mouth every 7 days, Disp: 12 capsule, Rfl: 1    aspirin 81 MG EC tablet, Take 1 tablet by mouth daily, Disp: , Rfl:     calcium citrate (CALCITRATE) 950 (200 Ca) MG tablet, Take 1 tablet by mouth daily, Disp: , Rfl:     cyanocobalamin 1000 MCG tablet, Take 1 tablet by mouth daily, Disp: , Rfl:     magnesium oxide (MAG-OX) 400 MG tablet, Take 1 tablet by mouth daily, Disp: , Rfl:  nystatin (MYCOSTATIN) 100000 UNIT/GM cream, Apply topically 2 times  daily, Disp: , Rfl:     potassium chloride (KLOR-CON M) 20 MEQ extended release tablet, Take 2 tablets by mouth daily, Disp: , Rfl:     thiamine 100 MG tablet, Take 1 tablet by mouth daily, Disp: , Rfl:         Allergies   Allergen Reactions    Latex Rash    Acetaminophen-Codeine Nausea Only and Other (comments)     Nausea and pains in stomach    Codeine Other (comments)     Nausea and abd pain    Butrans [Buprenorphine] Rash    Iodine Rash    Lactose Diarrhea     Lactose intolerance    Other Plant, Higher education careers adviser, Environmental Hives     Conductive electrodes    Penicillins Itching       Past Medical History:   Diagnosis Date    Arthritis     knee, arms,     Asthma     resolved after gastric bypass    Chronic obstructive pulmonary disease (HCC)     no meds, since gastric bypass    Chronic pain     Diabetes (HCC)     no meds    H/O sinusitis     Hx SBO     multiple surgeries prior to 2002    Hypercholesterolemia     Hypertension     Sleep apnea     NOT USING     Stress incontinence     Thrombocytopenia (HCC)     Toe fracture, left August 2015    4th toe       Social History     Socioeconomic History    Marital status: SINGLE     Spouse name: Not on file    Number of children: Not on file    Years of education: Not on file    Highest education level: Not on file   Occupational History    Not on file   Tobacco Use    Smoking status: Never    Smokeless tobacco: Never   Vaping Use    Vaping Use: Never used   Substance and Sexual Activity    Alcohol use: No     Alcohol/week: 0.0 standard drinks    Drug use: No    Sexual activity: Not Currently     Partners: Male   Other Topics Concern    Not on file   Social History Narrative    Retired Building control surveyor, 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 Denies history of smoking      Social Determinants of Company secretary Strain: Low Risk     Difficulty of Paying Living Expenses: Not very hard   Food Insecurity: No Food Insecurity    Worried About Charity fundraiser in  the Last Year: Never true    Ran Out of Food in the Last Year: Never true   Transportation Needs: Not on file   Physical Activity: Not on file   Stress: Not on file   Social Connections: Not on file   Intimate Partner Violence: Not on file   Housing Stability: Not on file       Family History   Problem Relation Age of Onset    Hypertension Mother     Stroke Mother     Diabetes Mother     Thyroid Disease Mother     Arthritis-rheumatoid Mother  Cancer Father         colon polpe    Cancer Maternal Aunt          OBJECTIVE    Physical Exam:     BP 136/82 (Site: Left Upper Arm, Position: Sitting, Cuff Size: Large Adult)   Pulse 78   Temp 98 F (36.7 C) (Temporal)   Resp 18   Ht 1.6 m (5\' 3" )   Wt 92.5 kg (204 lb)   SpO2 98%   BMI 36.14 kg/m         General: alert, well-appearing,obese,AA, in no apparent distress or pain  Neck: supple, no adenopathy palpated  CVS: normal rate, regular rhythm, distinct S1 and S2  Lungs:clear to ausculation bilaterally, no crackles, wheezing or rhonchi noted  Skin: warm, no lesions, rashes noted  Psych:  mood and affect normal  CMP:   Lab Results   Component Value Date/Time    NA 142 05/19/2022 09:28 AM    K 4.0 05/19/2022 09:28 AM    CL 111 05/19/2022 09:28 AM    CO2 28 05/19/2022 09:28 AM    BUN 13 05/19/2022 09:28 AM    CREATININE 0.56 05/19/2022 09:28 AM    GLUCOSE 67 05/19/2022 09:28 AM    GLUCOSE 107 12/10/2021 10:40 AM    CALCIUM 9.0 05/19/2022 09:28 AM    PROT 6.5 02/04/2022 10:26 AM    LABALBU 3.7 02/04/2022 10:26 AM    BILITOT 0.7 02/04/2022 10:26 AM    AST 15 02/04/2022 10:26 AM    ALT 22 02/04/2022 10:26 AM        CBC:   Lab Results   Component Value Date/Time    WBC 6.6 02/04/2022 10:26 AM    RBC 4.76 02/04/2022 10:26 AM    RBC 4.74 12/10/2021 10:40 AM    HGB 13.3 02/04/2022 10:26 AM    HCT 42.6 02/04/2022 10:26 AM    MCV 89.5 02/04/2022 10:26 AM    MCH 27.9 02/04/2022 10:26 AM    MCHC 31.2 02/04/2022 10:26 AM    RDW 14.0 02/04/2022 10:26 AM    PLT 146 02/04/2022  10:26 AM    MPV 12.6 02/04/2022 10:26 AM        Lipids   Lab Results   Component Value Date/Time    CHOL 166 12/10/2021 10:40 AM    CHOL 160 09/03/2020 07:47 AM    TRIG 89 12/10/2021 10:40 AM    HDL 61 12/10/2021 10:40 AM    LDLCALC 87 12/10/2021 10:40 AM    CHOLHDLRATIO 2.7 12/10/2021 10:40 AM         Imaging results last 24 hrs :MAM TOMO DIGITAL SCREEN BILATERAL    Result Date: 07/31/2021  BIRADS: 1-NEGATIVE BREAST DENSITY: C-The breasts are heterogeneously dense, which may obscure small masses. SCREENING MAMMOGRAM BILATERAL  3D tomosynthesis HISTORY: Screening. COMPARISON: 22 and other priors FINDINGS: 2D and 3D images were performed. Digital mammograms were performed. No suspicious microcalcifications, masses, or areas of architectural distortion. Interpretation performed in conjunction with computed assisted detection system.     No evidence of malignancy.  Suggest routine follow-up.      Imaging results impression onlyMAM TOMO DIGITAL SCREEN BILATERAL    Result Date: 07/31/2021  No evidence of malignancy.  Suggest routine follow-up.     MAM DIGITAL SCREEN W OR WO CAD BILATERAL    (Results Pending)       A1c:   Hemoglobin A1C   Date Value Ref Range Status   12/10/2021 5.8 (  H) 4.8 - 5.6 % Final   04/18/2021 5.7 (H) 4.2 - 5.6 % Final     Comment:     (NOTE)  HbA1C Interpretive Ranges  <5.7              Normal  5.7 - 6.4         Consider Prediabetes  >6.5              Consider Diabetes     12/20/2020 5.7 (H) 4.2 - 5.6 % Final     Comment:     (NOTE)  HbA1C Interpretive Ranges  <5.7              Normal  5.7 - 6.4         Consider Prediabetes  >6.5              Consider Diabetes         ASSESSMENT/PLAN  Diagnoses and all orders for this visit:     Essential hypertension  controlled w/ hypoKalemia issues  cont aldactone to 50 mg  Cont  kcl 20 meqs TOD (had has chronic hypoK issues since gastric bypass)  DASH diet, BP Log    Hypokalemia  See #1    Diabetes mellitus type 2, diet-controlled ([place])  Off meds after gastric  bypass 02/2018  Monitoring      Mild intermittent asthma, unspecified whether complicated  Controlled  Cont prn albuterol      Pure hypercholesterolemia   off statin after wt loss/gastric bypass 2019  Monitoring    BMI 35  S/p gastric bypass    Vitamin D deficiency   s/p gastric bypass  Cont 50 k weekly  Monitoring    S/p gastric bypass  W/ dumping syndrome/symptoms of dehydration/hypoglycemia erratic  Advised increase hydration, fiber,  protein on each meal    Syncope  Isolated event, unclear cause  Ongoing evaluation, upcoming EEG and event monitor  Following neuro/cardiology  monitoring    Chronic pain of both knee  Following pain management dr. Randel Pigg    Ff-up in 3 months, labs prior    Patient understands plan of care. Patient has provided input and agrees with goals.

## 2022-06-22 NOTE — Progress Notes (Unsigned)
"  Have you been to the ER, urgent care clinic since your last visit?  Hospitalized since your last visit?"    NO    "Have you seen or consulted any other health care providers outside of [facility] since your last visit?"    NO

## 2022-06-24 NOTE — Telephone Encounter (Signed)
Pt called back with the dosage of the potassium RX . She states that it is 99mg  and she takes it once a day. She also wanted to let Dr Kathi Ludwig know that she tested positive for Covid on 06/01/2022 with a home test. This is FYI doesn't need a call back. Thank you

## 2022-07-06 ENCOUNTER — Ambulatory Visit (HOSPITAL_COMMUNITY): Payer: Medicare Other | Admitting: Psychiatry

## 2022-07-06 ENCOUNTER — Encounter (HOSPITAL_COMMUNITY): Payer: Self-pay | Admitting: Psychiatry

## 2022-07-06 VITALS — BP 112/72 | HR 64 | Temp 98.1°F | Ht 65.0 in | Wt 146.8 lb

## 2022-07-06 DIAGNOSIS — F330 Major depressive disorder, recurrent, mild: Secondary | ICD-10-CM

## 2022-07-06 MED ORDER — CLORAZEPATE DIPOTASSIUM 3.75 MG PO TABS: 1.8750 mg | ORAL_TABLET | Freq: Two times a day (BID) | ORAL | 0 refills | Status: DC

## 2022-07-06 NOTE — Progress Notes (Signed)
[place] MD/PA/NP OP Progress Note  07/06/2022 1:37 PM GEARLDEAN MCCURTY  MRN:  [id number]  Visit Diagnosis:    ICD-10-CM   1. MDD (major depressive disorder), recurrent episode, mild (HCC)  F33.0       Assessment: ISIOMA BUGGS is a 72 y.o. female with a history of MDD who presented to [place] at [facility] for initial evaluation on 02/11/22.  Patient had followed with Dr. Casimiro Needle in the past.   At initial evaluation patient reported experiencing increased mood lability with periods of depressed mood, tearfulness, decreased energy secondary to interpersonal stressors most often around her son.  Patient denied any SI/HI or thoughts of self-harm.  She denied any paranoia, delusions, AVH, or manic symptoms.  She also endorsed experiencing periods of increased anger out of proportion to triggers.  During which time she turns the anger internal and does not act out verbally or physically.  Patient felt that since decreasing Celexa 3 years ago she has been unstable.  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  It also showed mild global parenchymal volume loss and chronic white matter microangiopathy.   [name] presents for follow-up evaluation. Today, 07/06/22, patient reports that her mood has still been poor over the past few weeks secondary to interpersonal stressors primarily with her sons.  Patient notes feeling like she is in the dark with the ongoing use of their lives.  Reviewed ways to try and handle the situation.  Of note patient did not decrease Tranxene last month due to miscommunication.  We will taper the dose to half a tab twice a day today.  We will also continue on Lexapro.  She is concerned about weight gain and is noted to gain about 2-3 pounds over the last 4 to 5 months.  At this time it is difficult to say the weight gain is secondary to the medication.  We will  continue to monitor for potential weight gain.  Plan:  - Continue Lexapro 10 mg QD - Continue Trazodone 75 mg QHS  - Taper Clorazepate to 1.875 QAM and 1.875 mg QHS for anxiety and depression plan to taper in the future - Pregabalin 50 mg at bedtime for small fiber neuropathy, managed by her neurologist - Labs reviewed, TSH WNL - Continue with [name] for therapy - Had went to a grief group, not a good fit. Thinking about a divorce group - Collateral obtained from Dr. Casimiro Needle    Chief Complaint:  Chief Complaint  Patient presents with   Follow-up   HPI: Varetta presents reporting that the past month has not been great.  She notes she has still not heard from her son Milta Deiters, since December and he did not get back to her until February.  Prior to that she had texted/called him multiple times asking him to contact her to let her know he was okay.  He did briefly contact her and asked me her later in the week.  When they met her son informed her the church he works at has not been doing well and wanted him to take a 9k paycut. He asked to move to a new church in part due to this and all the leadership. Now he is considering being a chaplain at the hospital but would need an additional year of schooling. Venus feels bad that she is not able to help her  son out more and be there for him.  Milta Deiters had also mentioned that he would like to pay his mother back the money he does however it may have to wait until he gets on his feet.  The following day she went over to her other son's, Toby, house and while she did enjoy seeing her granddaughter the overall impression stressful as she was still feeling bad about meal situation.  Patient also notes that he will be and his wife can yell at each other in front of her granddaughter which frustrates Mailinh.  He has a friend that has said it was a negative week we did explore what about those interactions made it such a bad few weeks.  After thinking a lot of her.   Kaislyn reported that overall she feels as if she is not completely connected with her family and children.  While she does it will be often he does not talk to her much or let her know what is going on in his life.  As for Milta Deiters he does not contact her very often which can be frustrating.  We discussed these feelings as well as how Cicely can handle them.  She notes being aware that she is unable to control how her son's actions and instead can only control how she responds.  Patient was encouraged to talk with her sons and let them know how she feels.  Outside of this patient does still have financial concerns and noted that in addition to [employer] allowing her to guard some money Toby also owes her money.  She believes he owes her around 20 K from back when he was using substances.  As she is worried about her current financial situation making ends meet she would like that money returned.  Patient notes she did apply for medical current job however they were not hiring at this time.  In regards to medication patient has noticed some confusion about the Tranxene.  She had been taking half a tab in the morning and a whole tablet in the evening still.  We discussed reducing it down to half a tab in the morning and 1/2 tablet in the evening.  Patient feels she is gaining weight on Celexa as a close titer.  We did check her weight again today and it was 0.6 pounds higher than her last 1.  Patient has gained around 2 to 3 pounds in the last 4-5 months.  We explained that this could be within the normal range of fluctuations and is hard to say that it is medication causing weight gain at this point.  Patient was agreeable to staying on Lexapro and considering alternatives in the future.  Past Psychiatric History: Patient reports first experiencing symptoms in 1998 with a final diagnosis coming in 1999.  She was voluntarily hospitalized in 2000 with severe depression.  She denies any active SI but does note passive SI  with no intent or plan at that time.  Patient saw Dr. Mariel Kansky, Dr. Marvel Plan, and Dr. Casimiro Needle during which time she was prescribed Depakote, Celexa, nortriptyline, trazodone, and clorazepate.  She reports being stable until 2020 when Celexa was decreased to 20 mg.  Cymbalta was started and then discontinued due to weight gain.  Started Lexapro on 04/13/2022  Patient denies any history of substance use.  Past Medical History:  Past Medical History:  Diagnosis Date   Allergic genetic state    Anemia    Anxiety    Arthritis  Cataract    Chicken pox    Clostridioides difficile infection [id number]   Complication of anesthesia    Depression    Family history of adverse reaction to anesthesia    mother - PONV   GERD (gastroesophageal reflux disease)    H/O hiatal hernia    History of GI bleed    Hypercholesteremia    Hyperlipidemia    Hypothyroidism    Osteoporosis    PONV (postoperative nausea and vomiting)    Stroke ([place]) 11/2017   Thyroid disease    Wears dentures    partial upper, full lower    Past Surgical History:  Procedure Laterality Date   ABDOMINAL HYSTERECTOMY  05/18/2000   total   BRAVO [place] STUDY  05/26/2012   Procedure: BRAVO [place] STUDY;  Surgeon: Lear Ng, MD;  Location: [facility] ENDOSCOPY;  Service: Endoscopy;  Laterality: N/A;   CATARACT EXTRACTION     CHOLECYSTECTOMY  05/18/2004   COLONOSCOPY WITH PROPOFOL N/A 10/24/2018   Procedure: COLONOSCOPY WITH PROPOFOL;  Surgeon: Manya Silvas, MD;  Location: [facility] ENDOSCOPY;  Service: Endoscopy;  Laterality: N/A;   COLONOSCOPY WITH PROPOFOL N/A 08/14/2021   Procedure: COLONOSCOPY WITH PROPOFOL;  Surgeon: Annamaria Helling, DO;  Location: [facility] ENDOSCOPY;  Service: Gastroenterology;  Laterality: N/A;   detatched retina surgery     ESOPHAGOGASTRODUODENOSCOPY  05/26/2012   Procedure: ESOPHAGOGASTRODUODENOSCOPY (EGD);  Surgeon: Lear Ng, MD;  Location: [facility];  Service: Endoscopy;  Laterality:  N/A;   ESOPHAGOGASTRODUODENOSCOPY  05/30/2012   Procedure: ESOPHAGOGASTRODUODENOSCOPY (EGD);  Surgeon: Cleotis Nipper, MD;  Location: [facility];  Service: Endoscopy;  Laterality: N/A;   ESOPHAGOGASTRODUODENOSCOPY N/A 08/31/2012   Procedure: ESOPHAGOGASTRODUODENOSCOPY (EGD);  Surgeon: Wonda Horner, MD;  Location: [facility] ENDOSCOPY;  Service: Endoscopy;  Laterality: N/A;   ESOPHAGOGASTRODUODENOSCOPY (EGD) WITH PROPOFOL N/A 10/24/2018   Procedure: ESOPHAGOGASTRODUODENOSCOPY (EGD) WITH PROPOFOL;  Surgeon: Manya Silvas, MD;  Location: [facility] ENDOSCOPY;  Service: Endoscopy;  Laterality: N/A;   ESOPHAGOGASTRODUODENOSCOPY (EGD) WITH PROPOFOL N/A 08/05/2020   Procedure: ESOPHAGOGASTRODUODENOSCOPY (EGD) WITH PROPOFOL;  Surgeon: Toledo, Benay Pike, MD;  Location: [facility] ENDOSCOPY;  Service: Gastroenterology;  Laterality: N/A;   ESOPHAGOGASTRODUODENOSCOPY (EGD) WITH PROPOFOL N/A 08/14/2021   Procedure: ESOPHAGOGASTRODUODENOSCOPY (EGD) WITH PROPOFOL;  Surgeon: Annamaria Helling, DO;  Location: [place];  Service: Gastroenterology;  Laterality: N/A;   EYE SURGERY     FOOT SURGERY Right    HIP SURGERY  1970s   "hip sunk in"   left eye surgery for detached retina     LIVER BIOPSY  2007   NECK SURGERY  05/18/2010   PLANTAR FASCIA RELEASE Left 10/10/2020   Procedure: ENDOSCOPIC PLANTAR FASC. RELEASE;  Surgeon: [name], DPM;  Location: [place];  Service: Podiatry;  Laterality: Left;  ANESTHESIA- CHOICE   ROTATOR CUFF REPAIR  03/18/2012   right side   THYROID SURGERY     nodule removed, right side   TMJ ARTHROPLASTY  05/18/1990   TUBAL LIGATION  05/19/1979    Family Psychiatric History: Both of her kids see psychiatrists for ADHD  Family History:  Family History  Problem Relation Age of Onset   Hypertension Mother    Kidney disease Mother    Heart attack Mother    Osteoporosis Mother    Lung cancer Father    Cancer Sister    Leukemia Sister    Colon polyps Sister     Ovarian cancer Paternal Aunt    Ovarian cancer Maternal Grandmother  Social History:  Social History   Socioeconomic History   Marital status: Divorced    Spouse name: Not on file   Number of children: 2   Years of education: Not on file   Highest education level: Not on file  Occupational History   Not on file  Tobacco Use   Smoking status: Former    Packs/day: 1.50    Years: 20.00    Total pack years: 30.00    Types: Cigarettes    Quit date: 02/05/2008    Years since quitting: 14.4   Smokeless tobacco: Never  Vaping Use   Vaping Use: Never used  Substance and Sexual Activity   Alcohol use: No   Drug use: No   Sexual activity: Not on file  Other Topics Concern   Not on file  Social History Narrative   Left Handed    Lives in a townhouse    Social Determinants of Health   Financial Resource Strain: Not on file  Food Insecurity: Not on file  Transportation Needs: Not on file  Physical Activity: Not on file  Stress: Not on file  Social Connections: Not on file    Allergies:  Allergies  Allergen Reactions   Lithium Anaphylaxis    Other reaction(s): anaphylaxis   Morphine Sulfate     Other reaction(s): nausea and vomiting   Fentanyl Other (See Comments)    Extreme Sedation Other reaction(s): Lethargy (intolerance) Extreme sedation  Other reaction(s): extreme sedation   Morphine Nausea And Vomiting and Nausea Only    Other reaction(s): Other (See Comments)   Pseudoephedrine Hypertension    Other reaction(s): Hypertension (intolerance), Other (See Comments), Other (See Comments) Hypertension Other reaction(s): Hypertension (intolerance)    Pseudoephedrine Hcl Nausea Only    Other reaction(s): hypertension, Other (See Comments)    Current Medications: Current Outpatient Medications  Medication Sig Dispense Refill   atorvastatin (LIPITOR) 40 MG tablet Take 40 mg by mouth daily.     betamethasone dipropionate 0.05 % cream Apply topically daily.      brimonidine (ALPHAGAN) 0.15 % ophthalmic solution SMARTSIG:In Eye(s)     Cholecalciferol 50 MCG (2000 UT) TABS Take 2,000 Units by mouth daily.     clorazepate (TRANXENE) 3.75 MG tablet Take 0.5 tablets (1.875 mg total) by mouth 2 (two) times daily. Take half a tablet (1.866m) by mouth in the morning and take 1 tablet (3.766m by mouth at in the evening 30 tablet 0   escitalopram (LEXAPRO) 10 MG tablet Take 1 tablet (10 mg total) by mouth daily. 30 tablet 2   levothyroxine (SYNTHROID, LEVOTHROID) 25 MCG tablet Take 25 mcg by mouth every morning.      montelukast (SINGULAIR) 10 MG tablet Take 10 mg by mouth at bedtime.     pantoprazole (PROTONIX) 40 MG tablet Take 40 mg by mouth daily.     pregabalin (LYRICA) 50 MG capsule Take 1 capsule (50 mg total) by mouth at bedtime. 30 capsule 5   timolol (TIMOPTIC) 0.5 % ophthalmic solution SMARTSIG:In Eye(s)     traZODone (DESYREL) 50 MG tablet Take 1.5 tablets (75 mg total) by mouth at bedtime. 45 tablet 2   vitamin B-12 (CYANOCOBALAMIN) 1000 MCG tablet Take 1,000 mcg by mouth daily.     No current facility-administered medications for this visit.     Musculoskeletal: Strength & Muscle Tone: within normal limits Gait & Station: normal Patient leans: N/A  Psychiatric Specialty Exam: Review of Systems  There were no vitals taken for this visit.There  is no height or weight on file to calculate BMI.  General Appearance: Well Groomed  Eye Contact:  Good  Speech:  Clear and Coherent and Normal Rate  Volume:  Normal  Mood:  Depressed  Affect:  Congruent  Thought Process:  Coherent and Goal Directed  Orientation:  Full (Time, Place, and Person)  Thought Content: Logical   Suicidal Thoughts:  No  Homicidal Thoughts:  No  Memory:  NA  Judgement:  Good  Insight:  Fair  Psychomotor Activity:  Normal  Concentration:  Concentration: Good  Recall:  Good  Fund of Knowledge: Good  Language: Good  Akathisia:  NA    AIMS (if indicated): not done   Assets:  Communication Skills Desire for Improvement Housing Leisure Time Resilience Transportation  ADL's:  Intact  Cognition: WNL  Sleep:  Fair   Metabolic Disorder Labs: Lab Results  Component Value Date   HGBA1C 5.7 (H) 11/28/2017   MPG 116.89 11/28/2017   No results found for: "PROLACTIN" Lab Results  Component Value Date   CHOL 180 11/28/2017   TRIG 128 11/28/2017   HDL 31 (L) 11/28/2017   CHOLHDL 5.8 11/28/2017   VLDL 26 11/28/2017   LDLCALC 123 (H) 11/28/2017   Lab Results  Component Value Date   TSH 1.180 08/19/2021   TSH 4.830 (H) 08/31/2012    Therapeutic Level Labs: No results found for: "LITHIUM" No results found for: "VALPROATE" No results found for: "CBMZ"   Screenings: [place] Office Visit from 02/11/2022 in [facility]  PHQ-2 Total Score 1      Flowsheet Row Admission (Discharged) from 08/14/2021 in [facility] from 05/30/2021 in [facility] at [facility] Admission (Discharged) from 10/10/2020 in [place] No Risk No Risk No Risk       Collaboration of Care: Collaboration of Care: Medication Management AEB medication prescription  Patient/Guardian was advised Release of Information must be obtained prior to any record release in order to collaborate their care with an outside provider. Patient/Guardian was advised if they have not already done so to contact the registration department to sign all necessary forms in order for [place] to release information regarding their care.   Consent: Patient/Guardian gives verbal consent for treatment and assignment of benefits for services provided during this visit. Patient/Guardian expressed understanding and agreed to proceed.    Vista Mink, MD 07/06/2022, 1:37 PM

## 2022-07-14 ENCOUNTER — Ambulatory Visit: Payer: Medicare Other | Admitting: Neurology

## 2022-07-14 ENCOUNTER — Encounter: Payer: Self-pay | Admitting: Neurology

## 2022-07-14 VITALS — BP 121/66 | HR 64 | Ht 65.4 in | Wt 147.0 lb

## 2022-07-14 DIAGNOSIS — G629 Polyneuropathy, unspecified: Secondary | ICD-10-CM | POA: Diagnosis not present

## 2022-07-14 MED ORDER — PREGABALIN 50 MG PO CAPS: 50.0000 mg | ORAL_CAPSULE | Freq: Two times a day (BID) | ORAL | 5 refills | Status: AC

## 2022-07-14 NOTE — Patient Instructions (Signed)
Increase pregabalin to '50mg'$  twice daily  Return to clinic in 6 months

## 2022-07-14 NOTE — Progress Notes (Signed)
Follow-up Visit   Date: 07/14/2022    Brittany [name] MRN: [id number] DOB: [birth year]    Brittany [name] is a 72 y.o. Caucasian female with iron deficient anemia, anxiety/depression, GERD, hyperlipidemia, hypertension, and hypothyroidism returning to the clinic for follow-up of small fiber neuropathy.  The patient was accompanied to the clinic by self.  IMPRESSION/PLAN: Small fiber neuropathy (idiopathic), diagnosed in 2023 by skin biopsy manifesting with distal paresthesias  - Previously tried:  gabapentin (constipation), unable to use TCA or SNRI due to drug-drug interation - Increase pregabaline to '50mg'$  BID - PT for balance declined  Meningioma, stable.  MRI brain from January 2024 reviewed.  Return to clinic in 6 months   --------------------------------------------- History of present illness: Starting around August 2022, she began having numbness/tingling involving the feet and lower legs. She also complains of pain in the knees and hands, which sounds more arthritic in nature. No numbness/tingling in the hands or knees.  She has been seeing Dr. Melrose Nakayama at [facility] since 2019 and was evaluated for these symptoms in September. Because of concern of plantar fasciitis, she was offered diclofenac and trial of Lyrica.  Her insurance, however, did not cover Lyrica. She is here for second opinion for her feet pain.  She also has a known right frontal parafalcine meningioma and previously seen seen neurosurgery, Dr. Deetta Perla, who recommended follow-up imaging in 2 year. She feels that it is causing new headaches and deformity of her skull.  She lives alone.  Nonsmoker, does not drink alcohol.   UPDATE 01/16/2022: She underwent skin biopsy with podiatry which shows small fiber neuropathy.  She is here to discusss this.  She continues to have painful tingling involving the feet, which is worse in the evening.    UPDATE 07/14/2022:  She continues to have numbness in the legs  and feet. Her pain is better controlled on pregabalin '50mg'$  qhs which she is tolerating, however in the morning, she reports worsening pain upon standing and walking.  She has fallen and endorses imbalance. She walks unassisted.   Medications:  Current Outpatient Medications on File Prior to Visit  Medication Sig Dispense Refill   atorvastatin (LIPITOR) 40 MG tablet Take 40 mg by mouth daily.     betamethasone dipropionate 0.05 % cream Apply topically daily.     brimonidine (ALPHAGAN) 0.15 % ophthalmic solution SMARTSIG:In Eye(s)     Cholecalciferol 50 MCG (2000 UT) TABS Take 2,000 Units by mouth daily.     [START ON 07/28/2022] clorazepate (TRANXENE) 3.75 MG tablet Take 0.5 tablets (1.875 mg total) by mouth 2 (two) times daily. Take half a tablet (1.'875mg'$ ) by mouth twice a day 30 tablet 0   escitalopram (LEXAPRO) 10 MG tablet Take 1 tablet (10 mg total) by mouth daily. 30 tablet 2   levothyroxine (SYNTHROID, LEVOTHROID) 25 MCG tablet Take 25 mcg by mouth every morning.      montelukast (SINGULAIR) 10 MG tablet Take 10 mg by mouth at bedtime.     pantoprazole (PROTONIX) 40 MG tablet Take 40 mg by mouth daily.     pregabalin (LYRICA) 50 MG capsule Take 1 capsule (50 mg total) by mouth at bedtime. 30 capsule 5   timolol (TIMOPTIC) 0.5 % ophthalmic solution SMARTSIG:In Eye(s)     traZODone (DESYREL) 50 MG tablet Take 1.5 tablets (75 mg total) by mouth at bedtime. 45 tablet 2   vitamin B-12 (CYANOCOBALAMIN) 1000 MCG tablet Take 1,000 mcg by mouth daily.  No current facility-administered medications on file prior to visit.    Allergies:  Allergies  Allergen Reactions   Lithium Anaphylaxis and Other (See Comments)    Other reaction(s): anaphylaxis   Morphine Sulfate     Other reaction(s): nausea and vomiting   Fentanyl Other (See Comments)    Extreme Sedation  Other reaction(s): Lethargy (intolerance)  Extreme sedation  Other reaction(s): extreme sedation  Other Reaction(s): extreme  sedation  Extreme Sedation    Extreme Sedation Other reaction(s): Lethargy (intolerance) Extreme sedation  Other reaction(s): extreme sedation   Morphine Nausea And Vomiting, Nausea Only and Other (See Comments)    Other reaction(s): Other (See Comments)   Pseudoephedrine Hypertension    Other reaction(s): Hypertension (intolerance), Other (See Comments), Other (See Comments) Hypertension Other reaction(s): Hypertension (intolerance)    Pseudoephedrine Hcl Nausea Only and Other (See Comments)    Other reaction(s): hypertension, Other (See Comments)    Vital Signs:  BP 121/66   Pulse 64   Ht 5' 5.4" (1.661 m)   Wt 147 lb (66.7 kg)   SpO2 97%   BMI 24.16 kg/m    Neurological Exam: MENTAL STATUS including orientation to time, place, person, recent and remote memory, attention span and concentration, language, and fund of knowledge is normal.  Speech is not dysarthric.  CRANIAL NERVES:    Normal conjugate, extra-ocular eye movements in all directions of gaze.  No ptosis .  Face is symmetric.  MOTOR:  Motor strength is 5/5 in all extremities, including distally  No atrophy, fasciculations or abnormal movements.  No pronator drift.  Tone is normal.    MSRs:  Reflexes are 2+/4 in the arms and absent in the legs  SENSORY:  Intact to vibration in the hands and knees, reduced below the ankles.  COORDINATION/GAIT:  Normal finger-to- nose-finger.  Intact rapid alternating movements bilaterally.  Gait narrow based and stable.   Data: Labs 03/07/2021:  B12 566, TSH 1.85 Skin biopsy 01/05/2022:  Severely reduced epidermal nerve fiber density consistent with small fiber neuropathy  MRI brain wwo contrast 06/05/2022: 1. No evidence of acute intracranial abnormality. 2. 1.1 x 0.9 cm meningioma along the right aspect of the mid falx, unchanged in size from the prior brain MRI of 06/08/2021. No mass effect upon the adjacent brain parenchyma. 3. Minimal chronic small vessel ischemic changes  within the cerebral white matter, stable. 4. Mild generalized cerebral atrophy.  MRI brain 06/08/2021: 1. No acute intracranial pathology. 2. 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 No mass effect on the underlying brain parenchyma. 3. Mild global parenchymal volume loss and chronic white matter microangiopathy.     Thank you for allowing me to participate in patient's care.  If I can answer any additional questions, I would be pleased to do so.    Sincerely,    Rip Hawes K. Posey Pronto, DO

## 2022-07-17 ENCOUNTER — Ambulatory Visit: Payer: Medicare HMO | Admitting: Neurology

## 2022-08-03 ENCOUNTER — Encounter

## 2022-08-03 ENCOUNTER — Inpatient Hospital Stay: Admit: 2022-08-03 | Payer: MEDICARE | Attending: Family Medicine | Primary: Family Medicine

## 2022-08-03 DIAGNOSIS — Z1231 Encounter for screening mammogram for malignant neoplasm of breast: Secondary | ICD-10-CM

## 2022-08-04 NOTE — Telephone Encounter (Signed)
Called pt back regarding previous inquiry on event monitor. She was unable to wear the evenet monitor for longer than 2 days. She had a rash appear quickly after using the patch and continued after using the alternate lead and electrode options.     Pt had seen neurology and they said she needed to follow up with Dr. Posey Pronto to get an event monitor.     I discussed with the pt that given she already tried both electrode and patch methods, there is not another adhesive option with [employer] for the event monitor.     Please advise.

## 2022-08-05 ENCOUNTER — Encounter (HOSPITAL_COMMUNITY): Payer: Self-pay | Admitting: Psychiatry

## 2022-08-05 ENCOUNTER — Ambulatory Visit (HOSPITAL_BASED_OUTPATIENT_CLINIC_OR_DEPARTMENT_OTHER): Payer: Medicare Other | Admitting: Psychiatry

## 2022-08-05 DIAGNOSIS — F330 Major depressive disorder, recurrent, mild: Secondary | ICD-10-CM

## 2022-08-05 MED ORDER — TRAZODONE HCL 50 MG PO TABS: 75.0000 mg | ORAL_TABLET | Freq: Every day | ORAL | 1 refills | Status: DC

## 2022-08-05 MED ORDER — ESCITALOPRAM OXALATE 10 MG PO TABS: 10.0000 mg | ORAL_TABLET | Freq: Every day | ORAL | 1 refills | Status: DC

## 2022-08-05 NOTE — Progress Notes (Signed)
BH MD/PA/NP OP Progress Note  08/05/2022 1:24 PM Brittany [name]  MRN:  [id number]  Visit Diagnosis:    ICD-10-CM   1. MDD (major depressive disorder), recurrent episode, mild (HCC)  F33.0       Assessment: Brittany [name] is a 71 y.o. female with a history of MDD who presented to [place] at [facility] for initial evaluation on 02/11/22.  Patient had followed with Dr. Casimiro Needle in the past.   At initial evaluation patient reported experiencing increased mood lability with periods of depressed mood, tearfulness, decreased energy secondary to interpersonal stressors most often around her son.  Patient denied any SI/HI or thoughts of self-harm.  She denied any paranoia, delusions, AVH, or manic symptoms.  She also endorsed experiencing periods of increased anger out of proportion to triggers.  During which time she turns the anger internal and does not act out verbally or physically.  Patient felt that since decreasing Celexa 3 years ago she has been unstable.  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  It also showed mild global parenchymal volume loss and chronic white matter microangiopathy.   Brittany [name] presents for follow-up evaluation. Today, 08/05/22, patient reports that due to miscommunication she did not pick up the Tranxene from the pharmacy and discontinued it a week ago.  Patient denies any adverse side effects from discontinuing the medication other than some slight decrease in sleep.  We will continue to monitor this and can increase trazodone in the future if needed.  Otherwise patients mood has improved some over the past month and things have been going well with her sons and granddaughter.  She did express concern about TIAs and was encouraged to speak to her PCP at the next appointment about this.  Plan:  - Continue Lexapro 10 mg QD - Continue Trazodone 75 mg  QHS  - Discontinue Clorazepate  - Pregabalin 50 mg at bedtime for small fiber neuropathy, managed by her neurologist - Labs reviewed, TSH WNL - Continue with [name] for therapy - Had went to a grief group, not a good fit. Thinking about a divorce group - Collateral obtained from Dr. Casimiro Needle - Follow up in a month    Chief Complaint:  Chief Complaint  Patient presents with   Follow-up   HPI: Brittany [name] presents reporting that the past month has been so-so.  She ran out of tranxene a week ago, but is not sure if she notices much difference other than some difference with sleeping.  Now she wakes up around 3 am and is able to fall back asleep part of the time.  We explained that this could be a side effect from discontinuing the medication and we will monitor to see if it improves.  If not we can increase trazodone in the future.  Outside of that though she does not eyes any significant change after stopping the Tranxene and is agreeable to continuing on without it.  Patient reports that things with her kids has gone all right.  Her oldest son came by for his birthday which she enjoyed and later he called letting her know about his reassignment. With the reassignment there will be a significant pay cut, which will impact his ability to pay Brittany [name] back. She was able to talk to him though and asked him to just do what he can, which he agreed to.  Patients only concern over the past month is a worry that she may be having TIAs.  She notes there were 3 times in the past month where she was driving and could not remember where she was.  Patient was aware that she was driving but did not recognize any of the landmarks around her until she drove for a little while.  There was also an episode a week and a half ago where she fell to the right due to a sudden onset of weakness.  During all the episodes she describes experiencing facial numbness.  Today on exam patient displayed no signs of facial  drooping/asymmetry or evidence of weakness.  She notes that she is meeting with her PCP in the near future and is encouraged to discuss the episodes with them.  Past Psychiatric History: Patient reports first experiencing symptoms in 1998 with a final diagnosis coming in 1999.  She was voluntarily hospitalized in 2000 with severe depression.  She denies any active SI but does note passive SI with no intent or plan at that time.  Patient saw Dr. Mariel Kansky, Dr. Marvel Plan, and Dr. Casimiro Needle during which time she was prescribed Depakote, Celexa, nortriptyline, trazodone, and clorazepate.  She reports being stable until 2020 when Celexa was decreased to 20 mg.  Cymbalta was started and then discontinued due to weight gain.  Started Lexapro on 04/13/2022  Patient denies any history of substance use.  Past Medical History:  Past Medical History:  Diagnosis Date   Allergic genetic state    Anemia    Anxiety    Arthritis    Cataract    Chicken pox    Clostridioides difficile infection [id number]   Complication of anesthesia    Depression    Family history of adverse reaction to anesthesia    mother - PONV   GERD (gastroesophageal reflux disease)    H/O hiatal hernia    History of GI bleed    Hypercholesteremia    Hyperlipidemia    Hypothyroidism    Osteoporosis    PONV (postoperative nausea and vomiting)    Stroke ([place]) 11/2017   Thyroid disease    Wears dentures    partial upper, full lower    Past Surgical History:  Procedure Laterality Date   ABDOMINAL HYSTERECTOMY  05/18/2000   total   BRAVO [place] STUDY  05/26/2012   Procedure: BRAVO [place] STUDY;  Surgeon: Lear Ng, MD;  Location: [facility] ENDOSCOPY;  Service: Endoscopy;  Laterality: N/A;   CATARACT EXTRACTION     CHOLECYSTECTOMY  05/18/2004   COLONOSCOPY WITH PROPOFOL N/A 10/24/2018   Procedure: COLONOSCOPY WITH PROPOFOL;  Surgeon: Manya Silvas, MD;  Location: [facility] ENDOSCOPY;  Service: Endoscopy;  Laterality: N/A;    COLONOSCOPY WITH PROPOFOL N/A 08/14/2021   Procedure: COLONOSCOPY WITH PROPOFOL;  Surgeon: Annamaria Helling, DO;  Location: [facility] ENDOSCOPY;  Service: Gastroenterology;  Laterality: N/A;   detatched retina surgery     ESOPHAGOGASTRODUODENOSCOPY  05/26/2012   Procedure: ESOPHAGOGASTRODUODENOSCOPY (EGD);  Surgeon: Lear Ng, MD;  Location: [facility] END[facility];  Service: Endoscopy;  Laterality: N/A;   ESOPHAGOGASTRODUODENOSCOPY  05/30/2012   Procedure: ESOPHAGOGASTRODUODENOSCOPY (EGD);  Surgeon: Cleotis Nipper, MD;  Location: [facility] ENDOSCOPY;  Service: Endoscopy;  Laterality: N/A;   ESOPHAGOGASTRODUODENOSCOPY N/A 08/31/2012   Procedure: ESOPHAGOGASTRODUODENOSCOPY (EGD);  Surgeon: Wonda Horner, MD;  Location: [facility] ENDOSCOPY;  Service: Endoscopy;  Laterality: N/A;   ESOPHAGOGASTRODUODENOSCOPY (EGD) WITH PROPOFOL N/A 10/24/2018   Procedure: ESOPHAGOGASTRODUODENOSCOPY (EGD) WITH PROPOFOL;  Surgeon: Gaylyn Cheers  T, MD;  Location: [facility] ENDOSCOPY;  Service: Endoscopy;  Laterality: N/A;   ESOPHAGOGASTRODUODENOSCOPY (EGD) WITH PROPOFOL N/A 08/05/2020   Procedure: ESOPHAGOGASTRODUODENOSCOPY (EGD) WITH PROPOFOL;  Surgeon: Toledo, Benay Pike, MD;  Location: [facility] ENDOSCOPY;  Service: Gastroenterology;  Laterality: N/A;   ESOPHAGOGASTRODUODENOSCOPY (EGD) WITH PROPOFOL N/A 08/14/2021   Procedure: ESOPHAGOGASTRODUODENOSCOPY (EGD) WITH PROPOFOL;  Surgeon: Annamaria Helling, DO;  Location: [place];  Service: Gastroenterology;  Laterality: N/A;   EYE SURGERY     FOOT SURGERY Right    HIP SURGERY  1970s   "hip sunk in"   left eye surgery for detached retina     LIVER BIOPSY  2007   NECK SURGERY  05/18/2010   PLANTAR FASCIA RELEASE Left 10/10/2020   Procedure: ENDOSCOPIC PLANTAR FASC. RELEASE;  Surgeon: [name], DPM;  Location: [place];  Service: Podiatry;  Laterality: Left;  ANESTHESIA- CHOICE   ROTATOR CUFF REPAIR  03/18/2012   right side   THYROID SURGERY     nodule removed,  right side   TMJ ARTHROPLASTY  05/18/1990   TUBAL LIGATION  05/19/1979    Family Psychiatric History: Both of her kids see psychiatrists for ADHD  Family History:  Family History  Problem Relation Age of Onset   Hypertension Mother    Kidney disease Mother    Heart attack Mother    Osteoporosis Mother    Lung cancer Father    Cancer Sister    Leukemia Sister    Colon polyps Sister    Ovarian cancer Paternal Aunt    Ovarian cancer Maternal Grandmother     Social History:  Social History   Socioeconomic History   Marital status: Divorced    Spouse name: Not on file   Number of children: 2   Years of education: Not on file   Highest education level: Not on file  Occupational History   Not on file  Tobacco Use   Smoking status: Former    Packs/day: 1.50    Years: 20.00    Additional pack years: 0.00    Total pack years: 30.00    Types: Cigarettes    Quit date: 02/05/2008    Years since quitting: 14.5   Smokeless tobacco: Never  Vaping Use   Vaping Use: Never used  Substance and Sexual Activity   Alcohol use: No   Drug use: No   Sexual activity: Not on file  Other Topics Concern   Not on file  Social History Narrative   Left Handed    Lives in a townhouse       Are you currently employed ?    What is your current occupation? retired   Do you live at home alone?yes   Who lives with you?    What type of home do you live in: 1 story or 2 story? two   Caffeine none    Social Determinants of Health   Financial Resource Strain: Not on file  Food Insecurity: Not on file  Transportation Needs: Not on file  Physical Activity: Not on file  Stress: Not on file  Social Connections: Not on file    Allergies:  Allergies  Allergen Reactions   Lithium Anaphylaxis and Other (See Comments)    Other reaction(s): anaphylaxis   Morphine Sulfate     Other reaction(s): nausea and vomiting   Fentanyl Other (See Comments)    Extreme Sedation  Other reaction(s):  Lethargy (intolerance)  Extreme sedation  Other reaction(s): extreme sedation  Other Reaction(s):  extreme sedation  Extreme Sedation    Extreme Sedation Other reaction(s): Lethargy (intolerance) Extreme sedation  Other reaction(s): extreme sedation   Morphine Nausea And Vomiting, Nausea Only and Other (See Comments)    Other reaction(s): Other (See Comments)   Pseudoephedrine Hypertension    Other reaction(s): Hypertension (intolerance), Other (See Comments), Other (See Comments) Hypertension Other reaction(s): Hypertension (intolerance)    Pseudoephedrine Hcl Nausea Only and Other (See Comments)    Other reaction(s): hypertension, Other (See Comments)    Current Medications: Current Outpatient Medications  Medication Sig Dispense Refill   atorvastatin (LIPITOR) 40 MG tablet Take 40 mg by mouth daily.     betamethasone dipropionate 0.05 % cream Apply topically daily.     brimonidine (ALPHAGAN) 0.15 % ophthalmic solution SMARTSIG:In Eye(s)     Cholecalciferol 50 MCG (2000 UT) TABS Take 2,000 Units by mouth daily.     escitalopram (LEXAPRO) 10 MG tablet Take 1 tablet (10 mg total) by mouth daily. 30 tablet 2   levothyroxine (SYNTHROID, LEVOTHROID) 25 MCG tablet Take 25 mcg by mouth every morning.      montelukast (SINGULAIR) 10 MG tablet Take 10 mg by mouth at bedtime.     pantoprazole (PROTONIX) 40 MG tablet Take 40 mg by mouth daily.     pregabalin (LYRICA) 50 MG capsule Take 1 capsule (50 mg total) by mouth 2 (two) times daily. 60 capsule 5   timolol (TIMOPTIC) 0.5 % ophthalmic solution SMARTSIG:In Eye(s)     traZODone (DESYREL) 50 MG tablet Take 1.5 tablets (75 mg total) by mouth at bedtime. 45 tablet 2   vitamin B-12 (CYANOCOBALAMIN) 1000 MCG tablet Take 1,000 mcg by mouth daily.     No current facility-administered medications for this visit.     Musculoskeletal: Strength & Muscle Tone: within normal limits Gait & Station: normal Patient leans: N/A  Psychiatric  Specialty Exam: Review of Systems  Height 5' 5.5" (1.664 m).Body mass index is 24.09 kg/m.  General Appearance: Well Groomed  Eye Contact:  Good  Speech:  Clear and Coherent and Normal Rate  Volume:  Normal  Mood:  Euthymic  Affect:  Congruent  Thought Process:  Coherent and Goal Directed  Orientation:  Full (Time, Place, and Person)  Thought Content: Logical   Suicidal Thoughts:  No  Homicidal Thoughts:  No  Memory:  NA  Judgement:  Good  Insight:  Fair  Psychomotor Activity:  Normal  Concentration:  Concentration: Good  Recall:  Good  Fund of Knowledge: Good  Language: Good  Akathisia:  NA    AIMS (if indicated): not done  Assets:  Communication Skills Desire for Improvement Housing Leisure Time Resilience Transportation  ADL's:  Intact  Cognition: WNL  Sleep:  Fair   Metabolic Disorder Labs: Lab Results  Component Value Date   HGBA1C 5.7 (H) 11/28/2017   MPG 116.89 11/28/2017   No results found for: "PROLACTIN" Lab Results  Component Value Date   CHOL 180 11/28/2017   TRIG 128 11/28/2017   HDL 31 (L) 11/28/2017   CHOLHDL 5.8 11/28/2017   VLDL 26 11/28/2017   LDLCALC 123 (H) 11/28/2017   Lab Results  Component Value Date   TSH 1.180 08/19/2021   TSH 4.830 (H) 08/31/2012    Therapeutic Level Labs: No results found for: "LITHIUM" No results found for: "VALPROATE" No results found for: "CBMZ"   Screenings: [place] Office Visit from 02/11/2022 in [facility]  PHQ-2 Total Score 1  Flowsheet Row Admission (Discharged) from 08/14/2021 in [place] ED from 05/30/2021 in [facility] Emergency Department at [facility] Admission (Discharged) from 10/10/2020 in [place] No Risk No Risk No Risk       Collaboration of Care: Collaboration of Care: Medication Management AEB medication prescription and  Other provider involved in patient's care AEB neurology chart available  Patient/Guardian was advised Release of Information must be obtained prior to any record release in order to collaborate their care with an outside provider. Patient/Guardian was advised if they have not already done so to contact the registration department to sign all necessary forms in order for [place] to release information regarding their care.   Consent: Patient/Guardian gives verbal consent for treatment and assignment of benefits for services provided during this visit. Patient/Guardian expressed understanding and agreed to proceed.    Vista Mink, MD 08/05/2022, 1:24 PM

## 2022-08-26 ENCOUNTER — Encounter

## 2022-08-27 MED ORDER — TRUEPLUS LANCETS 33G MISC
3 refills | Status: AC
Start: 2022-08-27 — End: ?

## 2022-08-27 MED ORDER — TRUE METRIX AIR GLUCOSE METER W/DEVICE KIT
PACK | 0 refills | Status: AC
Start: 2022-08-27 — End: ?

## 2022-08-27 MED ORDER — TRUE METRIX BLOOD GLUCOSE TEST VI STRP
ORAL_STRIP | 3 refills | Status: AC
Start: 2022-08-27 — End: ?

## 2022-09-03 ENCOUNTER — Other Ambulatory Visit: Payer: Self-pay | Admitting: Orthopedic Surgery

## 2022-09-09 ENCOUNTER — Ambulatory Visit (HOSPITAL_COMMUNITY): Payer: Medicare Other | Admitting: Psychiatry

## 2022-09-09 ENCOUNTER — Encounter (HOSPITAL_COMMUNITY): Payer: Self-pay | Admitting: Psychiatry

## 2022-09-09 DIAGNOSIS — F330 Major depressive disorder, recurrent, mild: Secondary | ICD-10-CM | POA: Diagnosis not present
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
## 2022-09-11 ENCOUNTER — Other Ambulatory Visit: Payer: Self-pay | Admitting: Ophthalmology

## 2022-09-11 DIAGNOSIS — H53132 Sudden visual loss, left eye: Secondary | ICD-10-CM

## 2022-09-11 DIAGNOSIS — H5340 Unspecified visual field defects: Secondary | ICD-10-CM

## 2022-09-16 ENCOUNTER — Inpatient Hospital Stay: Admit: 2022-09-16 | Payer: MEDICARE | Primary: Family Medicine

## 2022-09-16 DIAGNOSIS — E119 Type 2 diabetes mellitus without complications: Secondary | ICD-10-CM

## 2022-09-16 LAB — HEMOGLOBIN A1C W/O EAG: Hemoglobin A1C: 5.7 % — ABNORMAL HIGH (ref 4.2–5.6)

## 2022-09-16 LAB — COMPREHENSIVE METABOLIC PANEL
ALT: 27 U/L (ref 13–56)
AST: 22 U/L (ref 10–38)
Albumin/Globulin Ratio: 1.4 (ref 0.8–1.7)
Albumin: 4 g/dL (ref 3.4–5.0)
Alk Phosphatase: 88 U/L (ref 45–117)
Anion Gap: 5 mmol/L (ref 3.0–18)
BUN/Creatinine Ratio: 21 — ABNORMAL HIGH (ref 12–20)
BUN: 14 MG/DL (ref 7.0–18)
CO2: 29 mmol/L (ref 21–32)
Calcium: 8.8 MG/DL (ref 8.5–10.1)
Chloride: 107 mmol/L (ref 100–111)
Creatinine: 0.67 MG/DL (ref 0.6–1.3)
Est, Glom Filt Rate: >90 mL/min/{1.73_m2} (ref >60)
Globulin: 2.8 g/dL (ref 2.0–4.0)
Glucose: 101 mg/dL — ABNORMAL HIGH (ref 74–99)
Potassium: 3.9 mmol/L (ref 3.5–5.5)
Sodium: 141 mmol/L (ref 136–145)
Total Bilirubin: 0.6 MG/DL (ref 0.2–1.0)
Total Protein: 6.8 g/dL (ref 6.4–8.2)

## 2022-09-16 LAB — CBC WITH AUTO DIFFERENTIAL
Basophils %: 1 % (ref 0–2)
Basophils Absolute: 0.1 10*3/uL (ref 0.0–0.1)
Eosinophils %: 2 % (ref 0–5)
Eosinophils Absolute: 0.1 10*3/uL (ref 0.0–0.4)
Hematocrit: 44.1 % (ref 35.0–45.0)
Hemoglobin: 13.6 g/dL (ref 12.0–16.0)
Immature Granulocytes %: 0 % (ref 0.0–0.5)
Immature Granulocytes Absolute: 0 10*3/uL (ref 0.00–0.04)
Lymphocytes %: 32 % (ref 21–52)
Lymphocytes Absolute: 2.3 10*3/uL (ref 0.9–3.6)
MCH: 27.9 PG (ref 24.0–34.0)
MCHC: 30.8 g/dL — ABNORMAL LOW (ref 31.0–37.0)
MCV: 90.6 FL (ref 78.0–100.0)
MPV: 13 FL — ABNORMAL HIGH (ref 9.2–11.8)
Monocytes %: 7 % (ref 3–10)
Monocytes Absolute: 0.5 10*3/uL (ref 0.05–1.2)
Neutrophils %: 58 % (ref 40–73)
Neutrophils Absolute: 4.2 10*3/uL (ref 1.8–8.0)
Nucleated RBCs: 0 PER 100 WBC
Platelets: 131 10*3/uL — ABNORMAL LOW (ref 135–420)
RBC: 4.87 M/uL (ref 4.20–5.30)
RDW: 13.9 % (ref 11.6–14.5)
WBC: 7.2 10*3/uL (ref 4.6–13.2)
nRBC: 0 10*3/uL (ref 0.00–0.01)

## 2022-09-16 LAB — FERRITIN: Ferritin: 37 NG/ML (ref 8–388)

## 2022-09-16 LAB — LIPID PANEL
Chol/HDL Ratio: 2.7 (ref 0–5.0)
Cholesterol, Total: 152 MG/DL (ref <200)
HDL: 56 MG/DL (ref 40–60)
LDL Cholesterol: 73 MG/DL (ref 0–100)
Triglycerides: 115 MG/DL (ref <150)
VLDL Cholesterol Calculated: 23 MG/DL

## 2022-09-16 LAB — IRON AND TIBC
Iron % Saturation: 19 % — ABNORMAL LOW (ref 20–50)
Iron: 74 ug/dL (ref 50–175)
TIBC: 380 ug/dL (ref 250–450)

## 2022-09-16 LAB — VITAMIN B12 & FOLATE
Folate: 14.8 ng/mL (ref 3.10–17.50)
Vitamin B-12: 1533 pg/mL — ABNORMAL HIGH (ref 211–911)

## 2022-09-16 LAB — VITAMIN D 25 HYDROXY: Vit D, 25-Hydroxy: 38.4 ng/mL (ref 30–100)

## 2022-09-16 LAB — MICROALBUMIN / CREATININE URINE RATIO
Creatinine, Ur: 139 mg/dL — ABNORMAL HIGH (ref 30–125)
Microalb, Ur: 0.96 MG/DL (ref 0–3.0)
Microalb/Creat Ratio: 7 mg/g (ref 0–30)

## 2022-09-17 ENCOUNTER — Ambulatory Visit
Admission: RE | Admit: 2022-09-17 | Discharge: 2022-09-17 | Disposition: A | Discharge status: Home / Self Care | Payer: Medicare Other | Source: Ambulatory Visit | Attending: Ophthalmology | Admitting: Ophthalmology

## 2022-09-17 DIAGNOSIS — H5340 Unspecified visual field defects: Secondary | ICD-10-CM

## 2022-09-17 DIAGNOSIS — H53132 Sudden visual loss, left eye: Secondary | ICD-10-CM

## 2022-09-17 MED ORDER — GADOBUTROL 1 MMOL/ML IV SOLN
7.5000 mL | Freq: Once | INTRAVENOUS | Status: AC | PRN
  Administered 2022-09-17: 7.5 mL via INTRAVENOUS

## 2022-09-19 LAB — VITAMIN B1, WHOLE BLOOD: Vitamin B1,Whole Blood: 229.7 nmol/L — ABNORMAL HIGH (ref 66.5–200.0)

## 2022-09-21 ENCOUNTER — Ambulatory Visit: Admit: 2022-09-21 | Discharge: 2022-09-21 | Payer: MEDICARE | Attending: Family Medicine | Primary: Family Medicine

## 2022-09-21 ENCOUNTER — Ambulatory Visit: Payer: MEDICARE | Attending: Family Medicine | Primary: Family Medicine

## 2022-09-21 DIAGNOSIS — I10 Essential (primary) hypertension: Secondary | ICD-10-CM

## 2022-09-21 MED ORDER — FERROUS SULFATE 325 (65 FE) MG PO TABS
32565 (65 Fe) MG | ORAL_TABLET | Freq: Every day | ORAL | 1 refills | Status: AC
Start: 2022-09-21 — End: 2023-06-04

## 2022-09-21 NOTE — Progress Notes (Signed)
"  Have you been to the ER, urgent care clinic since your last visit?  Hospitalized since your last visit?"    NO    "Have you seen or consulted any other health care providers outside of [facility] since your last visit?"    NO            Click Here for Release of Records Request

## 2022-09-21 NOTE — Progress Notes (Signed)
SUBJECTIVE  Ff-up    HTN- currently on aldactone  Hypokalemia- endo eval neg for hyperaldo, currently on aldactone and kcl 40 meqs TID with normal K levels.     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  Reviewed malabsorption labs mild iron deficiency, not currently taking oral iron    DM-diet controlled, she does not check glucose at home. She is no longer on metformin.  Reviewed labs A1c 5.7     Asthma ?COPD- non smoker, previous welder. Says weight loss improved respirotary symptoms. denies cough, sob, wheezing. She says last albuterol use was a year ago. Usually heat triggers respiratory symptoms.    Chronic OA knee pain- now following [facility] pain management, on prn oxycodone dr. Sterling Big    Syncope w/ bowel and bladder incontinence-workup with cards and neuro , unrevealing MRI brain chronic microvascular changes, Echo/event monitor/EEG per patient without significant findings.  No recurrence, will plan to monitor    ROS:  See HPI, all others negative        Patient Active Problem List   Diagnosis Code    Chronic infection of sinus J32.9    Type 2 diabetes mellitus without complication (HCC) E11.9    Incontinence in female R32    Candidal intertrigo B37.2    Arthritis, multiple joint involvement M12.9    Essential hypertension I10    COPD (chronic obstructive pulmonary disease) (HCC) J44.9    Hyperlipidemia E78.5    Vitamin D deficiency E55.9    Internal hemorrhoids K64.8    Colon polyps K63.5    Hypokalemia E87.6    Hypomagnesemia E83.42    Syncope R55    Elevated troponin R77.8    Short gut syndrome K91.2    Hypocalcemia E83.51    Mild intermittent asthma J45.20    Thrombocytopenia, unspecified D69.6    Arthritis of knee M17.10    Non-compliance Z91.199    Arthrofibrosis of knee joint, 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         Current Outpatient Medications:     ferrous sulfate (IRON 325) 325 (65 Fe) MG  tablet, Take 1 tablet by mouth daily (with breakfast), Disp: 90 tablet, Rfl: 1    blood glucose test strips (TRUE METRIX BLOOD GLUCOSE TEST) strip, Check glucose level once daily, Disp: 100 strip, Rfl: 3    TRUEplus Lancets 33G MISC, Check glucose level once daily, Disp: 100 each, Rfl: 3    Blood Glucose Monitoring Suppl (TRUE METRIX AIR GLUCOSE METER) w/Device KIT, Check glucose level once daily, Disp: 1 kit, Rfl: 0    spironolactone (ALDACTONE) 50 MG tablet, TAKE 1 TABLET EVERY DAY, Disp: 90 tablet, Rfl: 1    ergocalciferol (ERGOCALCIFEROL) 1.25 MG (50000 UT) capsule, Take 1 capsule by mouth every 7 days, Disp: 12 capsule, Rfl: 1    aspirin 81 MG EC tablet, Take 1 tablet by mouth daily, Disp: , Rfl:     calcium citrate (CALCITRATE) 950 (200 Ca) MG tablet, Take 1 tablet by mouth daily, Disp: , Rfl:     cyanocobalamin 1000 MCG tablet, Take 1 tablet by mouth daily, Disp: , Rfl:     magnesium oxide (MAG-OX) 400 MG tablet, Take 1 tablet by mouth daily, Disp: , Rfl:     nystatin (MYCOSTATIN) 100000 UNIT/GM cream, Apply topically 2 times daily, Disp: , Rfl:     potassium chloride (KLOR-CON M) 20 MEQ extended release tablet, Take 2 tablets by mouth daily, Disp: ,  Rfl:     thiamine 100 MG tablet, Take 1 tablet by mouth daily, Disp: , Rfl:         Allergies   Allergen Reactions    Latex Rash    Acetaminophen-Codeine Nausea Only and Other (comments)     Nausea and pains in stomach    Codeine Other (comments)     Nausea and abd pain    Butrans [Buprenorphine] Rash    Iodine Rash    Lactose Diarrhea     Lactose intolerance    Other Plant, Educational psychologist, Environmental Hives     Conductive electrodes    Penicillins Itching       Past Medical History:   Diagnosis Date    Arthritis     knee, arms,     Asthma     resolved after gastric bypass    Chronic obstructive pulmonary disease (HCC)     no meds, since gastric bypass    Chronic pain     Diabetes (HCC)     no meds    H/O sinusitis     Hx SBO     multiple surgeries prior to 2002     Hypercholesterolemia     Hypertension     Sleep apnea     NOT USING     Stress incontinence     Thrombocytopenia (HCC)     Toe fracture, left August 2015    4th toe       Social History     Socioeconomic History    Marital status: SINGLE     Spouse name: Not on file    Number of children: Not on file    Years of education: Not on file    Highest education level: Not on file   Occupational History    Not on file   Tobacco Use    Smoking status: Never    Smokeless tobacco: Never   Vaping Use    Vaping Use: Never used   Substance and Sexual Activity    Alcohol use: No     Alcohol/week: 0.0 standard drinks    Drug use: No    Sexual activity: Not Currently     Partners: Male   Other Topics Concern    Not on file   Social History Narrative    Retired Psychologist, occupational, 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 Denies history of smoking      Social Determinants of Psychologist, prison and probation services Strain: Low Risk     Difficulty of Paying Living Expenses: Not very hard   Food Insecurity: No Food Insecurity    Worried About Programme researcher, broadcasting/film/video in the Last Year: Never true    Barista in the Last Year: Never true   Transportation Needs: Not on file   Physical Activity: Not on file   Stress: Not on file   Social Connections: Not on file   Intimate Partner Violence: Not on file   Housing Stability: Not on file       Family History   Problem Relation Age of Onset    Hypertension Mother     Stroke Mother     Diabetes Mother     Thyroid Disease Mother     Arthritis-rheumatoid Mother     Cancer Father         colon polpe    Cancer Maternal Aunt          OBJECTIVE    Physical Exam:  BP 136/82 (Site: Left Upper Arm, Position: Sitting, Cuff Size: Large Adult)   Pulse 73   Temp 97.3 F (36.3 C) (Temporal)   Resp 16   Ht 1.575 m (5\' 2" )   Wt 92.5 kg (204 lb)   SpO2 98%   BMI 37.31 kg/m         General: alert, well-appearing,obese,AA, in no apparent distress or pain  Neck: supple, no adenopathy palpated  CVS: normal rate,  regular rhythm, distinct S1 and S2  Lungs:clear to ausculation bilaterally, no crackles, wheezing or rhonchi noted  Feet no lesions normal monofilament  Psych:  mood and affect normal  CMP:   Lab Results   Component Value Date/Time    NA 141 09/16/2022 11:38 AM    K 3.9 09/16/2022 11:38 AM    CL 107 09/16/2022 11:38 AM    CO2 29 09/16/2022 11:38 AM    BUN 14 09/16/2022 11:38 AM    CREATININE 0.67 09/16/2022 11:38 AM    GLUCOSE 101 09/16/2022 11:38 AM    GLUCOSE 107 12/10/2021 10:40 AM    CALCIUM 8.8 09/16/2022 11:38 AM    BILITOT 0.6 09/16/2022 11:38 AM    AST 22 09/16/2022 11:38 AM    ALT 27 09/16/2022 11:38 AM        CBC:   Lab Results   Component Value Date/Time    WBC 7.2 09/16/2022 11:38 AM    RBC 4.87 09/16/2022 11:38 AM    RBC 4.74 12/10/2021 10:40 AM    HGB 13.6 09/16/2022 11:38 AM    HCT 44.1 09/16/2022 11:38 AM    MCV 90.6 09/16/2022 11:38 AM    MCH 27.9 09/16/2022 11:38 AM    MCHC 30.8 09/16/2022 11:38 AM    RDW 13.9 09/16/2022 11:38 AM    PLT 131 09/16/2022 11:38 AM    MPV 13.0 09/16/2022 11:38 AM        Lipids   Lab Results   Component Value Date/Time    CHOL 152 09/16/2022 11:38 AM    TRIG 115 09/16/2022 11:38 AM    HDL 56 09/16/2022 11:38 AM    CHOLHDLRATIO 2.7 09/16/2022 11:38 AM         Imaging results last 24 hrs :MAM TOMO DIGITAL SCREEN BILATERAL    Result Date: 07/31/2021  BIRADS: 1-NEGATIVE BREAST DENSITY: C-The breasts are heterogeneously dense, which may obscure small masses. SCREENING MAMMOGRAM BILATERAL  3D tomosynthesis HISTORY: Screening. COMPARISON: 22 and other priors FINDINGS: 2D and 3D images were performed. Digital mammograms were performed. No suspicious microcalcifications, masses, or areas of architectural distortion. Interpretation performed in conjunction with computed assisted detection system.     No evidence of malignancy.  Suggest routine follow-up.      Imaging results impression onlyMAM TOMO DIGITAL SCREEN BILATERAL    Result Date: 07/31/2021  No evidence of malignancy.   Suggest routine follow-up.     No orders to display       A1c:   Hemoglobin A1C   Date Value Ref Range Status   09/16/2022 5.7 (H) 4.2 - 5.6 % Final     Comment:     (NOTE)  HbA1C Interpretive Ranges  <5.7              Normal  5.7 - 6.4         Consider Prediabetes  >6.5              Consider Diabetes     12/10/2021 5.8 (H) 4.8 - 5.6 %  Final   04/18/2021 5.7 (H) 4.2 - 5.6 % Final     Comment:     (NOTE)  HbA1C Interpretive Ranges  <5.7              Normal  5.7 - 6.4         Consider Prediabetes  >6.5              Consider Diabetes         ASSESSMENT/PLAN  Diagnoses and all orders for this visit:     Essential hypertension  controlled w/ hypoKalemia issues  cont aldactone to 50 mg  Cont  kcl 20 meqs T I D (had has chronic hypoK issues since gastric bypass)  DASH diet, BP Log    Hypokalemia  See #1    Diabetes mellitus type 2, diet-controlled (HCC)  Off meds after gastric bypass 02/2018  Monitoring   -     Hemoglobin A1C; Future  -     Basic Metabolic Panel; Future  -     HM DIABETES FOOT EXAM   Mild intermittent asthma, unspecified whether complicated  Controlled  Cont prn albuterol      Pure hypercholesterolemia   off statin after wt loss/gastric bypass 2019  Monitoring    BMI 35-39 with comorbidity HCC  S/p gastric bypass    Vitamin D deficiency   s/p gastric bypass  Cont 50 k weekly  Monitoring    S/p gastric bypass  W/ dumping syndrome/symptoms of dehydration/hypoglycemia erratic  Advised increase hydration, fiber,  protein on each meal    Syncope  Isolated event, unclear cause  Etiology and neurowork-up negative  monitoring    Chronic pain of both knee  Following pain management dr. Sterling Big    Iron deficiency  Status post gastric bypass  For oral iron supplement  Monitoring labs prior to next visit  -     Basic Metabolic Panel; Future  -     Iron and TIBC; Future  -     CBC with Auto Differential; Future  -     ferrous sulfate (IRON 325) 325 (65 Fe) MG tablet; Take 1 tablet by mouth daily (with  breakfast)      Ff-up in 3 months, labs prior    Patient understands plan of care. Patient has provided input and agrees with goals.

## 2022-09-22 NOTE — Telephone Encounter (Signed)
Pt called and states that the pt was to call back with the glucose machine name. She states that it is a Engineer, [employer]. Please call if you have questions.

## 2022-09-22 NOTE — Telephone Encounter (Signed)
noted 

## 2022-10-06 ENCOUNTER — Encounter: Payer: Self-pay | Admitting: Orthopedic Surgery

## 2022-10-06 NOTE — Anesthesia Preprocedure Evaluation (Addendum)
Anesthesia Evaluation  Patient identified by MRN, date of birth, ID band Patient awake    Reviewed: Allergy & Precautions, NPO status , Patient's Chart, lab work & pertinent test results  History of Anesthesia Complications (+) PONV, Family history of anesthesia reaction and history of anesthetic complications  Airway Mallampati: III  TM Distance: >3 FB Neck ROM: Full    Dental no notable dental hx. (+) Upper Dentures, Lower Dentures   Pulmonary pneumonia, COPD, former smoker   Pulmonary exam normal breath sounds clear to auscultation       Cardiovascular Exercise Tolerance: Good Normal cardiovascular exam Rhythm:Regular Rate:Normal     Neuro/Psych  PSYCHIATRIC DISORDERS Anxiety Depression     Neuromuscular disease CVA    GI/Hepatic hiatal hernia,GERD  ,,Severe GERD, needs ETT For surgery today   Endo/Other  Hypothyroidism    Renal/GU      Musculoskeletal  (+) Arthritis ,    Abdominal   Peds  Hematology  (+) Blood dyscrasia, anemia   Anesthesia Other Findings Hypercholesteremia  PONV (postoperative nausea and vomiting) Hypothyroidism Depression  GERD (gastroesophageal reflux disease) H/O hiatal hernia  Anemia Cataract  Anxiety glaucoma Osteoporosis  Allergic genetic state Arthritis  Hyperlipidemia  Thyroid disease Fentanyl makes her sleepy History of GI bleed Wears dentures  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     Reproductive/Obstetrics                             Anesthesia Physical Anesthesia Plan  ASA: 3  Anesthesia Plan: General ETT   Post-op Pain Management:    Induction: Intravenous  PONV Risk Score and Plan:   Airway Management Planned: Oral ETT  Additional Equipment:   Intra-op Plan:   Post-operative Plan: Extubation in OR  Informed Consent: I have reviewed the patients History and Physical, chart, labs  and discussed the procedure including the risks, benefits and alternatives for the proposed anesthesia with the patient or authorized representative who has indicated his/her understanding and acceptance.     Dental Advisory Given  Plan Discussed with: Anesthesiologist, CRNA and Surgeon  Anesthesia Plan Comments: (Patient consented for risks of anesthesia including but not limited to:  - adverse reactions to medications - damage to eyes, teeth, lips or other oral mucosa - nerve damage due to positioning  - sore throat or hoarseness - Damage to heart, brain, nerves, lungs, other parts of body or loss of life  Patient voiced understanding.)        Anesthesia Quick Evaluation

## 2022-10-13 ENCOUNTER — Ambulatory Visit
Admission: RE | Admit: 2022-10-13 | Discharge: 2022-10-13 | Disposition: A | Discharge status: Home / Self Care | Payer: Medicare Other | Attending: Orthopedic Surgery | Admitting: Orthopedic Surgery

## 2022-10-13 ENCOUNTER — Other Ambulatory Visit: Payer: Self-pay

## 2022-10-13 ENCOUNTER — Encounter: Payer: Self-pay | Admitting: Orthopedic Surgery

## 2022-10-13 ENCOUNTER — Encounter
Admission: RE | Disposition: A | Discharge status: Home / Self Care | Payer: Self-pay | Source: Home / Self Care | Attending: Orthopedic Surgery

## 2022-10-13 ENCOUNTER — Ambulatory Visit: Payer: Medicare Other | Admitting: Anesthesiology

## 2022-10-13 DIAGNOSIS — Z87891 Personal history of nicotine dependence: Secondary | ICD-10-CM | POA: Diagnosis not present

## 2022-10-13 DIAGNOSIS — K219 Gastro-esophageal reflux disease without esophagitis: Secondary | ICD-10-CM | POA: Diagnosis not present

## 2022-10-13 DIAGNOSIS — Z8673 Personal history of transient ischemic attack (TIA), and cerebral infarction without residual deficits: Secondary | ICD-10-CM | POA: Insufficient documentation

## 2022-10-13 DIAGNOSIS — K449 Diaphragmatic hernia without obstruction or gangrene: Secondary | ICD-10-CM | POA: Diagnosis not present

## 2022-10-13 DIAGNOSIS — J449 Chronic obstructive pulmonary disease, unspecified: Secondary | ICD-10-CM | POA: Insufficient documentation

## 2022-10-13 DIAGNOSIS — F32A Depression, unspecified: Secondary | ICD-10-CM | POA: Insufficient documentation

## 2022-10-13 DIAGNOSIS — E039 Hypothyroidism, unspecified: Secondary | ICD-10-CM | POA: Insufficient documentation

## 2022-10-13 DIAGNOSIS — M1812 Unilateral primary osteoarthritis of first carpometacarpal joint, left hand: Secondary | ICD-10-CM | POA: Insufficient documentation

## 2022-10-13 DIAGNOSIS — G709 Myoneural disorder, unspecified: Secondary | ICD-10-CM | POA: Diagnosis not present

## 2022-10-13 HISTORY — PX: CARPOMETACARPAL (CMC) FUSION OF THUMB: SHX6290

## 2022-10-13 SURGERY — CARPOMETACARPAL (CMC) FUSION OF THUMB
Anesthesia: General | Site: Thumb | Laterality: Left

## 2022-10-13 MED ORDER — EPHEDRINE SULFATE (PRESSORS) 50 MG/ML IJ SOLN
INTRAMUSCULAR | Status: DC | PRN
  Administered 2022-10-13 (×5): 5 mg via INTRAVENOUS

## 2022-10-13 MED ORDER — 0.9 % SODIUM CHLORIDE (POUR BTL) OPTIME
TOPICAL | Status: DC | PRN
  Administered 2022-10-13: 60 mL

## 2022-10-13 MED ORDER — SUCCINYLCHOLINE CHLORIDE 200 MG/10ML IV SOSY
PREFILLED_SYRINGE | INTRAVENOUS | Status: DC | PRN
  Administered 2022-10-13: 100 mg via INTRAVENOUS

## 2022-10-13 MED ORDER — MIDAZOLAM HCL 2 MG/2ML IJ SOLN: 1.0000 mg | Freq: Once | INTRAMUSCULAR | Status: DC

## 2022-10-13 MED ORDER — GELATIN ABSORBABLE 12-7 MM EX MISC
CUTANEOUS | Status: DC | PRN
  Administered 2022-10-13: 1

## 2022-10-13 MED ORDER — PROPOFOL 10 MG/ML IV BOLUS
INTRAVENOUS | Status: DC | PRN
  Administered 2022-10-13: 100 ug/kg/min via INTRAVENOUS
  Administered 2022-10-13 (×2): 100 mg via INTRAVENOUS
  Administered 2022-10-13: 50 ug via INTRAVENOUS

## 2022-10-13 MED ORDER — DEXAMETHASONE SODIUM PHOSPHATE 4 MG/ML IJ SOLN
INTRAMUSCULAR | Status: DC | PRN
  Administered 2022-10-13: 10 mg via INTRAVENOUS

## 2022-10-13 MED ORDER — CEFAZOLIN SODIUM-DEXTROSE 2-4 GM/100ML-% IV SOLN
2.0000 g | INTRAVENOUS | Status: AC
  Administered 2022-10-13: 2 g via INTRAVENOUS

## 2022-10-13 MED ORDER — LIDOCAINE-EPINEPHRINE 1 %-1:100000 IJ SOLN
INTRAMUSCULAR | Status: DC | PRN
  Administered 2022-10-13 (×2): 10 mL

## 2022-10-13 MED ORDER — LACTATED RINGERS IV SOLN: INTRAVENOUS | Status: DC

## 2022-10-13 MED ORDER — ACETAMINOPHEN 10 MG/ML IV SOLN
INTRAVENOUS | Status: DC | PRN
  Administered 2022-10-13: 1000 mg via INTRAVENOUS

## 2022-10-13 MED ORDER — ONDANSETRON HCL 4 MG/2ML IJ SOLN
INTRAMUSCULAR | Status: DC | PRN
  Administered 2022-10-13: 4 mg via INTRAVENOUS

## 2022-10-13 MED ORDER — ROPIVACAINE HCL 5 MG/ML IJ SOLN
INTRAMUSCULAR | Status: DC | PRN
  Administered 2022-10-13: 20 mL via EPIDURAL

## 2022-10-13 MED ORDER — ONDANSETRON HCL 4 MG/2ML IJ SOLN
4.0000 mg | Freq: Once | INTRAMUSCULAR | Status: AC
  Administered 2022-10-13: 4 mg via INTRAVENOUS

## 2022-10-13 MED ORDER — HYDROCODONE-ACETAMINOPHEN 5-325 MG PO TABS: 1.0000 | ORAL_TABLET | ORAL | 0 refills | Status: DC | PRN

## 2022-10-13 MED ORDER — PHENYLEPHRINE HCL (PRESSORS) 10 MG/ML IV SOLN
INTRAVENOUS | Status: DC | PRN
  Administered 2022-10-13 (×2): 50 ug via INTRAVENOUS

## 2022-10-13 SURGICAL SUPPLY — 25 items
APL PRP STRL LF DISP 70% ISPRP (MISCELLANEOUS) ×1
BNDG CMPR STD VLCR NS LF 5.8X4 (GAUZE/BANDAGES/DRESSINGS) ×1
BNDG ELASTIC 4X5.8 VLCR NS LF (GAUZE/BANDAGES/DRESSINGS) ×2 IMPLANT
CHLORAPREP W/TINT 26 (MISCELLANEOUS) ×2 IMPLANT
COVER LIGHT HANDLE UNIVERSAL (MISCELLANEOUS) IMPLANT
DRAPE FLUOR MINI C-ARM 54X84 (DRAPES) ×2 IMPLANT
GAUZE SPONGE 4X4 12PLY STRL (GAUZE/BANDAGES/DRESSINGS) ×2 IMPLANT
GAUZE XEROFORM 1X8 LF (GAUZE/BANDAGES/DRESSINGS) ×2 IMPLANT
GLOVE SURG SYN 9.0  PF PI (GLOVE) ×1
GLOVE SURG SYN 9.0 PF PI (GLOVE) ×2 IMPLANT
GOWN STRL REIN 2XL XLG LVL4 (GOWN DISPOSABLE) IMPLANT
GOWN STRL REUS W/ TWL LRG LVL3 (GOWN DISPOSABLE) ×2 IMPLANT
GOWN STRL REUS W/TWL LRG LVL3 (GOWN DISPOSABLE) ×1
KIT TURNOVER KIT A (KITS) ×2 IMPLANT
NS IRRIG 500ML POUR BTL (IV SOLUTION) ×2 IMPLANT
PACK EXTREMITY ARMC (MISCELLANEOUS) ×2 IMPLANT
PADDING CAST BLEND 4X4 NS (MISCELLANEOUS) IMPLANT
SPLINT CAST 1 STEP 3X12 (MISCELLANEOUS) ×2 IMPLANT
SUT ETHILON 4-0 (SUTURE) ×1
SUT ETHILON 4-0 FS2 18XMFL BLK (SUTURE) ×1
SUT VIC AB 0 CT2 27 (SUTURE) ×2 IMPLANT
SUT VIC AB 3-0 SH 27 (SUTURE) ×1
SUT VIC AB 3-0 SH 27X BRD (SUTURE) ×2 IMPLANT
SUTURE ETHLN 4-0 FS2 18XMF BLK (SUTURE) ×2 IMPLANT
SYSTEM IMPLANT TIGHTROPE MINI (Anchor) ×2 IMPLANT

## 2022-10-13 NOTE — Discharge Instructions (Signed)
Keep arm elevated is much as possible this week Ice to the back of the hand today and tomorrow should help with pain and swelling Keep dressing clean and dry Pain medicine as directed Call office if you are having problems  [phone] 

## 2022-10-13 NOTE — H&P (Signed)
Chief Complaint Patient presents with Left Thumb - Follow-up, Pain   History of the Present Illness: Brittany [name] is a 72 y.o. female here today.  The patient is a 72 year old female who is here for left thumb CMC arthroplasty history and physical. Her surgery is scheduled for 10/13/2022. She comes in today to review the procedure and for a physical exam.  The patient currently has 5 crowns implanted. She has removable dentures which she will not wear to surgery.  I have reviewed past medical, surgical, social and family history, and allergies as documented in the EMR.  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 Depression GERD (gastroesophageal reflux disease) Glaucoma (increased eye pressure) Hyperlipidemia Osteoporosis, post-menopausal Thyroid disease  Past Surgical History: Past Surgical History: Procedure Laterality Date TEMPOROMANDIBULAR JOINT ARTHROPLASTY 1992 CHOLECYSTECTOMY 2006 LIVER BIOPSY 2007 Thyroid nodule removed 2011 ARTHROSCOPIC ROTATOR CUFF REPAIR Right 2012 LAPAROSCOPIC ESOPHAGOGASTRIC NISSEN FUNDOPLASTY SINGLE PORT 2014 left eye surgery for detached retina 07/23/2017 COLONOSCOPY 10/24/2018 Adenomatous Polyp: CBF 10/2023 EGD 10/24/2018 Gastritis: No repeat EGD 08/05/2020 Gastritis/Intestinal metaplasia/Repeat 81yrs/TKT COLONOSCOPY 08/14/2021 Normal colon biopsy/PHx CP/Repeat 46yrs/SMR EGD 08/14/2021 Intestinal metaplasia/Gastritis/Repeat 7yrs/SMR CATARACT EXTRACTION Foot Surgery Right pins placed in broken bones HYSTERECTOMY total TUBAL LIGATION  Past Family History: Family History Problem Relation Age of Onset Myocardial Infarction (Heart attack) Mother Kidney disease Mother Osteoporosis (Thinning of bones) Mother High blood pressure (Hypertension) Mother Lung cancer Father Leukemia Sister Colon  polyps Sister Colon cancer Neg Hx  Medications: Current Outpatient Medications Medication Sig Dispense Refill atorvastatin (LIPITOR) 40 MG tablet TAKE 1 TABLET(40 MG) BY MOUTH EVERY DAY 90 tablet 1 betamethasone dipropionate (DIPROSONE) 0.05 % cream Apply 1 Application topically once daily betamethasone valerate (VALISONE) 0.1 % cream APPLY A THIN LAYER TO THE AFFECTED AREA(S) BY TOPICAL ROUTE ONCE DAILY AS NEEDED brimonidine (ALPHAGAN P) 0.15 % ophthalmic solution cholecalciferol (VITAMIN D3) 2,000 unit tablet Take 2,000 Units by mouth once daily cyanocobalamin (VITAMIN B12) 1000 MCG tablet Take 1,000 mcg by mouth once daily escitalopram oxalate (LEXAPRO) 5 MG tablet Take 10 mg by mouth once daily montelukast (SINGULAIR) 10 mg tablet TAKE 1 TABLET(10 MG) BY MOUTH EVERY DAY 90 tablet 1 pantoprazole (PROTONIX) 40 MG DR tablet Take 1 tablet (40 mg total) by mouth once daily Take 30 min before meal 30 tablet 3 pregabalin (LYRICA) 50 MG capsule Take 50 mg by mouth 2 (two) times daily timoloL maleate (TIMOPTIC) 0.5 % ophthalmic solution traZODone (DESYREL) 50 MG tablet Take 100 mg by mouth at bedtime levothyroxine (SYNTHROID) 25 MCG tablet TAKE 1 TABLET(25 MCG) BY MOUTH EVERY DAY 90 tablet 1 pregabalin (LYRICA) 50 MG capsule Take 50 mg by mouth at bedtime (Patient not taking: Reported on 09/30/2022)  No current facility-administered medications for this visit.  Allergies: Allergies Allergen Reactions Lithium Anaphylaxis Morphine Nausea and Vomiting Pseudoephedrine Other (See Comments) Hypertension Fentanyl Other (See Comments) Extreme Sedation  Extreme Sedation Other reaction(s): Lethargy (intolerance) Extreme sedation  Other reaction(s): extreme sedation   Body mass index is 27.58 kg/m.  Review of Systems: A comprehensive 14 point ROS was performed, reviewed, and the pertinent orthopaedic findings are documented in the HPI.  Vitals: 09/30/22 0912 BP: 114/66   General  Physical Examination:  General/Constitutional: No apparent distress: well-nourished and well developed. Eyes: Pupils equal, round with synchronous movement. Lungs: Clear to auscultation HEENT: Unremarkable. Crowns but nothing loose or removable. Vascular: No edema, swelling or tenderness, except as noted in detailed  exam. Cardiac: Heart rate and rhythm is regular. Integumentary: No impressive skin lesions present, except as noted in detailed exam. Neuro/Psych: Normal mood and affect, oriented to person, place, and time. Musculoskeletal: Left thumb abduction is 10 degrees, abduction to 0, extension is 20, and flexion is 10. She has severe pain with any attempted motion of the thumb CMC joint.  Radiographs:  Prior x-rays from 04/23/2022 showed bilateral severe osteoarthritis with complete loss of joint space, significant subluxation of the thumb CMC joints, left is worse than right, but without STT arthritis.  Assessment: ICD-10-CM 1. 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  I had a comprehensive discussion regarding the planned procedure. All questions were answered. The proposed plan of action involves a CMC arthroplasty to be performed in the forthcoming weeks.  Surgical Risks:  The nature of the condition and the proposed procedure has been reviewed in detail with the patient. Surgical versus non-surgical options and prognosis for recovery have been reviewed and the inherent risks and benefits of each have been discussed including the risks of infection, bleeding, injury to nerves/blood vessels/tendons, incomplete relief of symptoms, persisting pain and/or stiffness, loss of function, complex regional pain syndrome, failure of the procedure, as appropriate.  Teeth: Unremarkable.  Document Attestation: I, Brittany [name], have reviewed and updated documentation for [facility], MD, utilizing Nuance DAX.   Electronically signed by Brittany Clipper, MD at 10/01/2022 10:31 AM EDT   Reviewed  H+P. No changes noted.

## 2022-10-13 NOTE — Transfer of Care (Signed)
Immediate Anesthesia Transfer of Care Note  Patient: Brittany [name]  Procedure(s) Performed: Left thumb carpometacarpal arthroplasty (Left: Thumb)  Patient Location: PACU  Anesthesia Type: General ETT  Level of Consciousness: awake, alert  and patient cooperative  Airway and Oxygen Therapy: Patient Spontanous Breathing and Patient connected to supplemental oxygen  Post-op Assessment: Post-op Vital signs reviewed, Patient's Cardiovascular Status Stable, Respiratory Function Stable, Patent Airway and No signs of Nausea or vomiting  Post-op Vital Signs: Reviewed and stable  Complications: No notable events documented.

## 2022-10-13 NOTE — Op Note (Signed)
10/13/2022  1:57 PM  PATIENT:  Brittany [name]  72 y.o. 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  PROCEDURE:  Procedure(s): Left thumb carpometacarpal arthroplasty (Left)  SURGEON: Leitha Schuller, MD  ASSISTANTS: none  ANESTHESIA:   general  EBL:  Total I/O In: 500 [I.V.:500] Out: -   BLOOD ADMINISTERED:none  DRAINS: none   LOCAL MEDICATIONS USED:  OTHER Exparel block given by anesthesia preoperatively  SPECIMEN:  No Specimen  DISPOSITION OF SPECIMEN:  N/A  COUNTS:  YES  TOURNIQUET:  * Missing tourniquet times found for documented tourniquets in log: [id number] *  IMPLANTS: Mini tight rope x 1  DICTATION: .Dragon Dictation   patient was brought to the operating room and after adequate general anesthesia was obtained the left arm was prepped and draped in the usual sterile fashion with appropriate patient identification and timeout procedure. The tourniquet been placed at the upper arm and rate raised to 250 at the start of the case. An incision was made from the base of the first metacarpal to the scaphoid on the radial side with the skin and subcutaneous tissue spread and cutaneous nerves preserved as much as possible. The capsule was opened with the tendon insertions protected the trapezium was identified and the capsule elevated is a flap for subsequent repair subperiosteal dissection carried out with the trapezium removed and several large fragments. After this was done the wire from the mini tight rope was inserted from the base of the first metacarpal into the shaft of the second toe with small incision made dorsally to allow for passage of the wire the implant was then passed from the thumb side to the second with metal anchor on the thumb side the anchor on the radial thumb secondary carpal side was then passed and tightened at the appropriate level  with traction applied to the thumb at the appropriate tension. There is no pistoning and after sutures were placed and appropriate knots placed the sutures were cut short and the wounds thoroughly irrigated.  The void from the trapezium was then filled with Gelfoam followed by 0 Vicryl for to close the capsule 3-0 Vicryl subcutaneously and skin 4-0 nylon in a simple interrupted manner for all both skin incisions. Xeroform and a well-padded thumb spica splint were applied. Tourniquet let down and Ace wrap placed.   PLAN OF CARE: Discharge to home after PACU  PATIENT DISPOSITION:  PACU - hemodynamically stable.

## 2022-10-13 NOTE — Anesthesia Procedure Notes (Signed)
Procedure Name: Intubation Date/Time: 10/13/2022 12:48 PM  Performed by: Domenic Moras, CRNAPre-anesthesia Checklist: Patient identified, Emergency Drugs available, Suction available and Patient being monitored Patient Re-evaluated:Patient Re-evaluated prior to induction Oxygen Delivery Method: Circle system utilized Preoxygenation: Pre-oxygenation with 100% oxygen Induction Type: IV induction and Rapid sequence Laryngoscope Size: Mac and 3 Grade View: Grade II Tube type: Oral Tube size: 7.0 mm Number of attempts: 1 Airway Equipment and Method: Stylet and Oral airway Placement Confirmation: ETT inserted through vocal cords under direct vision, positive ETCO2 and breath sounds checked- equal and bilateral Tube secured with: Tape Dental Injury: Teeth and Oropharynx as per pre-operative assessment

## 2022-10-13 NOTE — Anesthesia Procedure Notes (Signed)
Anesthesia Regional Block: Axillary brachial plexus block   Pre-Anesthetic Checklist: , timeout performed,  Correct Patient, Correct Site, Correct Laterality,  Correct Procedure, Correct Position, site marked,  Risks and benefits discussed,  Surgical consent,  Pre-op evaluation,  At surgeon's request and post-op pain management  Laterality: Left  Prep: chloraprep       Needles:  Injection technique: Single-shot  Needle Type: Stimiplex     Needle Length: 4cm  Needle Gauge: 20     Additional Needles:   Procedures: Doppler guided,, transarterial technique,, ultrasound used (permanent image in chart), intact distal pulses,   Motor weakness within 5 minutes.  Narrative:  Start time: 10/13/2022 12:08 PM End time: 10/13/2022 12:22 PM  Performed by: Personally  Anesthesiologist: Marisue Humble, MD

## 2022-10-13 NOTE — Anesthesia Postprocedure Evaluation (Signed)
Anesthesia Post Note  Patient: Brittany [name]  Procedure(s) Performed: Left thumb carpometacarpal arthroplasty (Left: Thumb)  Patient location during evaluation: PACU Anesthesia Type: General Level of consciousness: awake and alert Pain management: pain level controlled Vital Signs Assessment: post-procedure vital signs reviewed and stable Respiratory status: spontaneous breathing, nonlabored ventilation, respiratory function stable and patient connected to nasal cannula oxygen Cardiovascular status: blood pressure returned to baseline and stable Postop Assessment: no apparent nausea or vomiting Anesthetic complications: no   No notable events documented.   Last Vitals:  Vitals:   10/13/22 1406 10/13/22 1410  BP: (!) 147/76   Pulse: (!) 116 (!) 114  Resp: 17 (!) 21  Temp: 36.8 C   SpO2: 97% 96%    Last Pain:  Vitals:   10/13/22 1406  TempSrc:   PainSc: 0-No pain                 Zeus Marquis C [name]

## 2022-10-14 ENCOUNTER — Encounter: Payer: Self-pay | Admitting: Orthopedic Surgery

## 2022-10-14 MED ORDER — VITAMIN D (ERGOCALCIFEROL) 1.25 MG (50000 UT) PO CAPS
1.2550000 MG (50000 UT) | ORAL_CAPSULE | ORAL | 1 refills | Status: AC
Start: 2022-10-14 — End: 2023-03-08

## 2022-10-14 NOTE — Telephone Encounter (Signed)
LOV 09/21/2022  F/U 12/20/2022

## 2022-10-21 ENCOUNTER — Ambulatory Visit (HOSPITAL_BASED_OUTPATIENT_CLINIC_OR_DEPARTMENT_OTHER): Payer: Medicare Other | Admitting: Psychiatry

## 2022-10-21 ENCOUNTER — Encounter (HOSPITAL_COMMUNITY): Payer: Self-pay | Admitting: Psychiatry

## 2022-10-21 VITALS — BP 90/58 | HR 80 | Ht 64.5 in | Wt 148.0 lb

## 2022-10-21 DIAGNOSIS — F330 Major depressive disorder, recurrent, mild: Secondary | ICD-10-CM | POA: Diagnosis not present

## 2022-10-21 MED ORDER — TRAZODONE HCL 100 MG PO TABS: 100.0000 mg | ORAL_TABLET | Freq: Every day | ORAL | 1 refills | Status: DC
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
## 2022-11-03 MED ORDER — SPIRONOLACTONE 50 MG PO TABS
50 MG | ORAL_TABLET | Freq: Every day | ORAL | 1 refills | Status: DC
Start: 2022-11-03 — End: 2023-03-29

## 2022-11-03 NOTE — Telephone Encounter (Signed)
LOV 09/21/2022   F/U 12/22/2022

## 2022-11-24 ENCOUNTER — Encounter (HOSPITAL_COMMUNITY): Payer: Self-pay | Admitting: Psychiatry

## 2022-11-24 ENCOUNTER — Ambulatory Visit (HOSPITAL_BASED_OUTPATIENT_CLINIC_OR_DEPARTMENT_OTHER): Payer: Medicare Other | Admitting: Psychiatry

## 2022-11-24 VITALS — BP 144/79 | HR 82 | Ht 64.5 in | Wt 152.0 lb

## 2022-11-24 DIAGNOSIS — F330 Major depressive disorder, recurrent, mild: Secondary | ICD-10-CM | POA: Diagnosis not present

## 2022-11-24 MED ORDER — ESCITALOPRAM OXALATE 10 MG PO TABS
10.0000 mg | ORAL_TABLET | Freq: Every day | ORAL | 1 refills | Status: DC
Start: 2022-12-28 — End: 2023-02-05

## 2022-11-24 NOTE — Progress Notes (Signed)
BH MD/PA/NP OP Progress Note  11/24/2022 10:54 AM Brittany [name]  MRN:  [id number]  Visit Diagnosis:    ICD-10-CM   1. MDD (major depressive disorder), recurrent episode, mild (HCC)  F33.0 escitalopram (LEXAPRO) 10 MG tablet       Assessment: Brittany [name] is a 72 y.o. female with a history of MDD who presented to [facility] at [facility] for initial evaluation on 02/11/22.  Patient had followed with Dr. Donell Beers in the past.   At initial evaluation patient reported experiencing increased mood lability with periods of depressed mood, tearfulness, decreased energy secondary to interpersonal stressors most often around her son.  Patient denied any SI/HI or thoughts of self-harm.  She denied any paranoia, delusions, AVH, or manic symptoms.  She also endorsed experiencing periods of increased anger out of proportion to triggers.  During which time she turns the anger internal and does not act out verbally or physically.  Patient felt that since decreasing Celexa 3 years ago she has been unstable.  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  It also showed mild global parenchymal volume loss and chronic white matter microangiopathy.   Brittany [name] presents for follow-up evaluation. Today, 11/24/22, patient reports that the last month has gone pretty well. Things have improved some with her family dynamics and she is scheduled to start a job and book club in the next month. With all this going on her mood has been stable. Will continue on her current regimen and follow up in a month.  Plan:  - Continue Lexapro 10 mg QD - Continue trazodone 100 mg QHS  - Pregabalin 50 mg at bedtime for small fiber neuropathy, managed by her neurologist - Labs reviewed, TSH WNL - Continue with [name] for therapy - Had went to a grief group, not a good fit. Thinking about a divorce group -  Collateral obtained from Dr. Donell Beers - Follow up in a month    Chief Complaint:  Chief Complaint  Patient presents with   Follow-up   HPI: Brittany [name] presents reporting Lloyd Huger came down for her birthday which was a happy surprise. Toby did sell the beach house, but Decarla and his family got to spend a week there before hand. Was good up until the end. Arelly wanted to start packing up early in the week for them to move, but Toni Amend did not which frustrated her. Rokeisha did start watching Ava again a couple weeks ago when [place] and his wife to AA. She is happy that this has restarted again. 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 Antione also inquired about joining a book club and went to a session this past Saturday. She enjoyed it and plans to join the group in August. Will occur the first Saturday of every month.   Chaunte also reached out to her ex since Dickie La was worried about his mood and ability to care for himself. He had remarried but his new wife left him and was newly on his own again. Auree did begrudgingly reach out to check on him. Some of this resentment was since her ex never reached out to her in the past.   Past Psychiatric History: Patient reports first experiencing symptoms in 1998 with a final diagnosis coming in 1999.  She was voluntarily hospitalized in 2000 with severe depression.  She denies any active SI but does note passive SI with no intent or plan at that time.  Patient saw Dr. Janann August, Dr. Senaida Ores, and Dr. Donell Beers during which time she was prescribed Depakote, Celexa, nortriptyline, trazodone, and clorazepate.  She reports being stable until 2020 when Celexa was decreased to 20 mg.  Cymbalta was started and then discontinued due to weight gain.  Started Lexapro on 04/13/2022  Patient denies any history of substance use.  Past Medical History:  Past Medical History:  Diagnosis Date   Allergic genetic state    Anemia     Anxiety    Arthritis    Cataract    Chicken pox    Clostridioides difficile infection 09/05/2018   Complication of anesthesia    Depression    Family history of adverse reaction to anesthesia    mother - PONV   GERD (gastroesophageal reflux disease)    H/O hiatal hernia    History of GI bleed    Hypercholesteremia    Hyperlipidemia    Hypothyroidism    Osteoporosis    PONV (postoperative nausea and vomiting)    Stroke (HCC) 11/2017   Thyroid disease    Wears dentures    partial upper, full lower    Past Surgical History:  Procedure Laterality Date   ABDOMINAL HYSTERECTOMY  05/18/2000   total   BRAVO PH STUDY  05/26/2012   Procedure: BRAVO PH STUDY;  Surgeon: Shirley Friar, MD;  Location: [facility];  Service: Endoscopy;  Laterality: N/A;   CARPOMETACARPAL (CMC) FUSION OF THUMB Left 10/13/2022   Procedure: Left thumb carpometacarpal arthroplasty;  Surgeon: Kennedy Bucker, MD;  Location: [facility];  Service: Orthopedics;  Laterality: Left;   CATARACT EXTRACTION     CHOLECYSTECTOMY  05/18/2004   COLONOSCOPY WITH PROPOFOL N/A 10/24/2018   Procedure: COLONOSCOPY WITH PROPOFOL;  Surgeon: Scot Jun, MD;  Location: [facility] END[facility]COPY;  Service: Endoscopy;  Laterality: N/A;   COLONOSCOPY WITH PROPOFOL N/A 08/14/2021   Procedure: COLONOSCOPY WITH PROPOFOL;  Surgeon: [name], DO;  Location: [facility] ENDOSCOPY;  Service: Gastroenterology;  Laterality: N/A;   detatched retina surgery     ESOPHAGOGASTRODUODENOSCOPY  05/26/2012   Procedure: ESOPHAGOGASTRODUODENOSCOPY (EGD);  Surgeon: Shirley Friar, MD;  Location: [facility];  Service: Endoscopy;  Laterality: N/A;   ESOPHAGOGASTRODUODENOSCOPY  05/30/2012   Procedure: ESOPHAGOGASTRODUODENOSCOPY (EGD);  Surgeon: Florencia Reasons, MD;  Location: [facility];  Service: Endoscopy;  Laterality: N/A;   ESOPHAGOGASTRODUODENOSCOPY N/A 08/31/2012   Procedure: ESOPHAGOGASTRODUODENOSCOPY (EGD);  Surgeon: Graylin Shiver, MD;  Location: [facility] ENDOSCOPY;  Service: Endoscopy;  Laterality: N/A;   ESOPHAGOGASTRODUODENOSCOPY (EGD) WITH PROPOFOL N/A 10/24/2018   Procedure: ESOPHAGOGASTRODUODENOSCOPY (EGD) WITH PROPOFOL;  Surgeon: Scot Jun, MD;  Location: [facility] ENDOSCOPY;  Service: Endoscopy;  Laterality: N/A;   ESOPHAGOGASTRODUODENOSCOPY (EGD) WITH PROPOFOL N/A 08/05/2020   Procedure: ESOPHAGOGASTRODUODENOSCOPY (EGD) WITH PROPOFOL;  Surgeon: Toledo, Boykin Nearing, MD;  Location: [facility] ENDOSCOPY;  Service: Gastroenterology;  Laterality: N/A;   ESOPHAGOGASTRODUODENOSCOPY (EGD) WITH PROPOFOL N/A 08/14/2021   Procedure: ESOPHAGOGASTRODUODENOSCOPY (EGD) WITH PROPOFOL;  Surgeon: [name], DO;  Location: [facility] ENDOSCOPY;  Service: Gastroenterology;  Laterality: N/A;   EYE SURGERY     FOOT SURGERY Right    HIP SURGERY  1970s   "hip sunk in"   left eye surgery for detached retina     LIVER BIOPSY  2007   NECK SURGERY  05/18/2010   PLANTAR FASCIA RELEASE Left 10/10/2020   Procedure: ENDOSCOPIC PLANTAR FASC. RELEASE;  Surgeon: Rosetta Posner, DPM;  Location: [facility];  Service: Podiatry;  Laterality: Left;  ANESTHESIA- CHOICE   ROTATOR CUFF REPAIR  03/18/2012   right side   THYROID SURGERY     nodule removed, right side   TMJ ARTHROPLASTY  05/18/1990   TUBAL LIGATION  05/19/1979    Family Psychiatric History: Both of her kids see psychiatrists for ADHD  Family History:  Family History  Problem Relation Age of Onset   Hypertension Mother    Kidney disease Mother    Heart attack Mother    Osteoporosis Mother    Lung cancer Father    Cancer Sister    Leukemia Sister    Colon polyps Sister    Ovarian cancer Paternal Aunt    Ovarian cancer Maternal Grandmother     Social History:  Social History   Socioeconomic History   Marital status: Divorced    Spouse name: Not on file   Number of children: 2   Years of education: Not on file   Highest education level: Not on file   Occupational History   Not on file  Tobacco Use   Smoking status: Former    Packs/day: 1.50    Years: 20.00    Additional pack years: 0.00    Total pack years: 30.00    Types: Cigarettes    Quit date: 02/05/2008    Years since quitting: 14.8   Smokeless tobacco: Never  Vaping Use   Vaping Use: Never used  Substance and Sexual Activity   Alcohol use: No   Drug use: No   Sexual activity: Not on file  Other Topics Concern   Not on file  Social History Narrative   Left Handed    Lives in a townhouse       Are you currently employed ?    What is your current occupation? retired   Do you live at home alone?yes   Who lives with you?    What type of home do you live in: 1 story or 2 story? two   Caffeine none    Social Determinants of Health   Financial Resource Strain: Not on file  Food Insecurity: Not on file  Transportation Needs: Not on file  Physical Activity: Not on file  Stress: Not on file  Social Connections: Not on file    Allergies:  Allergies  Allergen Reactions   Lithium Anaphylaxis   Morphine Sulfate Nausea And Vomiting   Fentanyl Other (See Comments)    Extreme Sedation  Other reaction(s): Lethargy (intolerance)    Morphine Nausea And Vomiting   Pseudoephedrine Hypertension        Pseudoephedrine Hcl Nausea Only and Hypertension    Current Medications: Current Outpatient Medications  Medication Sig Dispense Refill   atorvastatin (LIPITOR) 40 MG tablet Take 40 mg by mouth daily.     betamethasone dipropionate 0.05 % cream Apply topically daily.     brimonidine (ALPHAGAN) 0.15 % ophthalmic solution SMARTSIG:In Eye(s)     Cholecalciferol 50 MCG (2000 UT) TABS Take 2,000 Units by mouth daily.     DORZOLAMIDE HCL OP Apply to eye 2 (two) times daily.     [START ON 12/28/2022] escitalopram (LEXAPRO) 10 MG tablet Take 1 tablet (10 mg total) by mouth daily. 90 tablet 1   HYDROcodone-acetaminophen (NORCO/VICODIN) 5-325 MG tablet Take 1 tablet by mouth  every 4 (four) hours as needed for moderate pain. 20 tablet 0   levothyroxine (SYNTHROID, LEVOTHROID) 25 MCG tablet Take  25 mcg by mouth every morning.      montelukast (SINGULAIR) 10 MG tablet Take 10 mg by mouth at bedtime.     pantoprazole (PROTONIX) 40 MG tablet Take 40 mg by mouth daily.     pregabalin (LYRICA) 50 MG capsule Take 1 capsule (50 mg total) by mouth 2 (two) times daily. 60 capsule 5   timolol (TIMOPTIC) 0.5 % ophthalmic solution SMARTSIG:In Eye(s)     traZODone (DESYREL) 100 MG tablet Take 1 tablet (100 mg total) by mouth at bedtime. 90 tablet 1   vitamin B-12 (CYANOCOBALAMIN) 1000 MCG tablet Take 1,000 mcg by mouth daily.     No current facility-administered medications for this visit.     Musculoskeletal: Strength & Muscle Tone: within normal limits Gait & Station: normal Patient leans: N/A  Psychiatric Specialty Exam: Review of Systems  Blood pressure (!) 144/79, pulse 82, height 5' 4.5" (1.638 m), weight 152 lb (68.9 kg).Body mass index is 25.69 kg/m.  General Appearance: Well Groomed  Eye Contact:  Good  Speech:  Clear and Coherent and Normal Rate  Volume:  Normal  Mood:  Euthymic  Affect:  Appropriate and Congruent  Thought Process:  Coherent and Goal Directed  Orientation:  Full (Time, Place, and Person)  Thought Content: Logical   Suicidal Thoughts:  No  Homicidal Thoughts:  No  Memory:  NA  Judgement:  Good  Insight:  Fair  Psychomotor Activity:  Normal  Concentration:  Concentration: Good  Recall:  Good  Fund of Knowledge: Good  Language: Good  Akathisia:  NA    AIMS (if indicated): not done  Assets:  Communication Skills Desire for Improvement Housing Leisure Time Resilience Transportation  ADL's:  Intact  Cognition: WNL  Sleep:  Good   Metabolic Disorder Labs: Lab Results  Component Value Date   HGBA1C 5.7 (H) 11/28/2017   MPG 116.89 11/28/2017   No results found for: "PROLACTIN" Lab Results  Component Value Date   CHOL 180  11/28/2017   TRIG 128 11/28/2017   HDL 31 (L) 11/28/2017   CHOLHDL 5.8 11/28/2017   VLDL 26 11/28/2017   LDLCALC 123 (H) 11/28/2017   Lab Results  Component Value Date   TSH 1.180 08/19/2021   TSH 4.830 (H) 08/31/2012    Therapeutic Level Labs: No results found for: "LITHIUM" No results found for: "VALPROATE" No results found for: "CBMZ"   Screenings: PHQ2-9    Flowsheet Row Office Visit from [facility]/11/20[facility] in [facility]  PHQ-2 Total Score 1      Flowsheet Row Admission (Discharged) from 10/13/2022 in [facility] PERIOP Admission (Discharged) from 08/14/2021 in [facility] ED from 05/30/2021 in [facility] Emergency Department at [facility]  C-SSRS RISK CATEGORY No Risk No Risk No Risk       Collaboration of Care: Collaboration of Care: Medication Management AEB medication prescription  40 minutes were spent in chart review, interview, psycho education, counseling, medical decision making, coordination of care and long-term prognosis.  Patient was given opportunity to ask question and all concerns and questions were addressed and answers. Excluding separately billable services.  Patient/Guardian was advised Release of Information must be obtained prior to any record release in order to collaborate their care with an outside provider. Patient/Guardian was advised if they have not already done so to contact the registration department to sign all necessary forms in order for [place] to release information regarding their care.   Consent: Patient/Guardian  gives verbal consent for treatment and assignment of benefits for services provided during this visit. Patient/Guardian expressed understanding and agreed to proceed.    Stasia Cavalier, MD 11/24/2022, 10:54 AM

## 2022-12-16 IMAGING — MR MR ANKLE*L* W/O CM
6 series · 40 of 40 positions shown · non-contrast
Comparison: None.

CLINICAL DATA: Left foot pain after feeling a pop 1 week ago

EXAM:
MRI OF THE LEFT ANKLE WITHOUT CONTRAST
TECHNIQUE: Multiplanar, multisequence MR imaging of the ankle was performed. No
intravenous contrast was administered.

[Series 4: PD fat-sat · axial · left · 3.0mm · 0.50mm/px · z∈[-122,+17]mm · 8 of 36 slices shown]
[im 1/36]
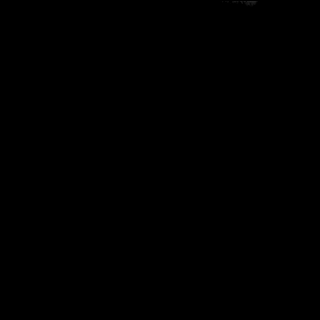
[im 6/36]
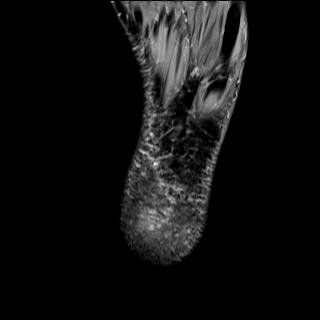
[im 11/36]
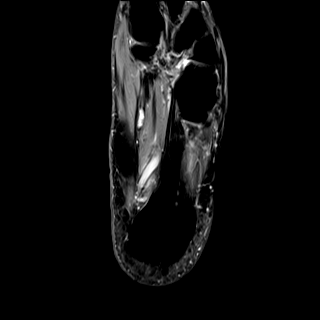
[im 16/36]
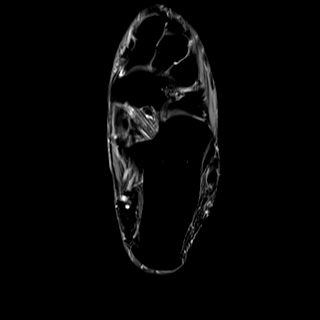
[im 21/36]
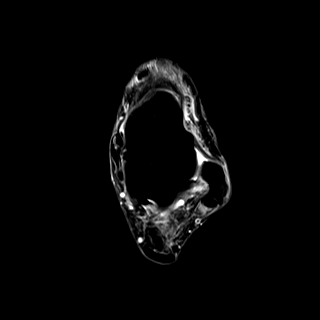
[im 26/36]
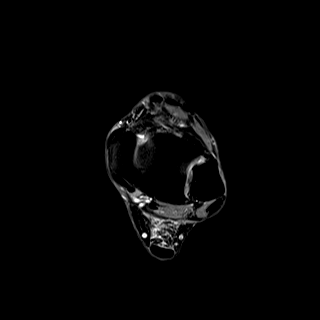
[im 31/36]
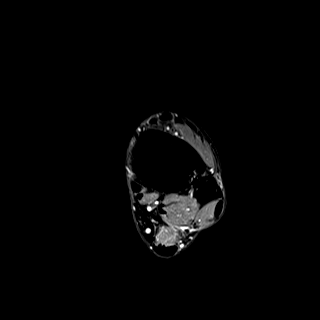
[im 36/36]
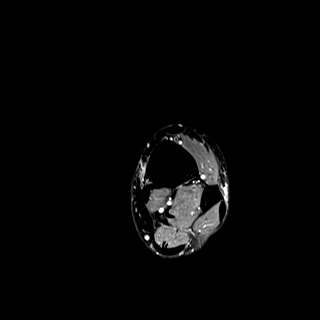

[Series 5: T2 fat-sat · axial · left · 3.0mm · 0.50mm/px · z∈[-122,+17]mm · 8 of 36 slices shown]
[im 1/36]
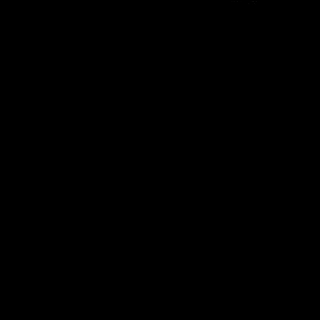
[im 6/36]
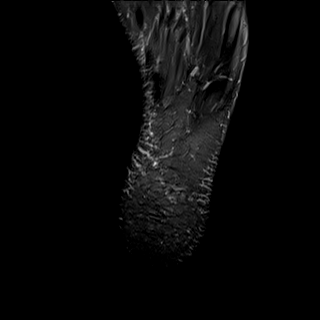
[im 11/36]
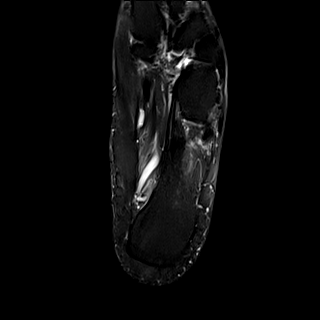
[im 16/36]
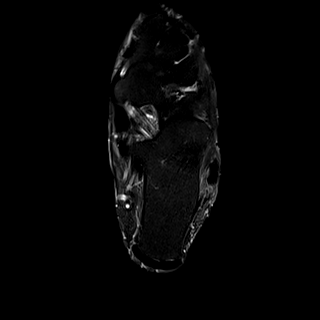
[im 21/36]
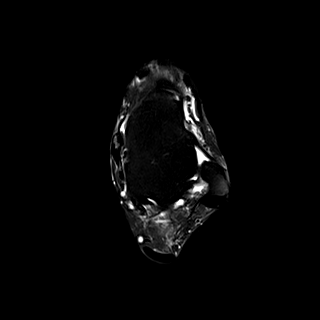
[im 26/36]
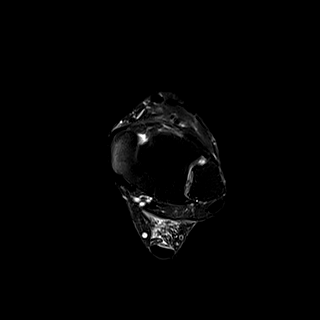
[im 31/36]
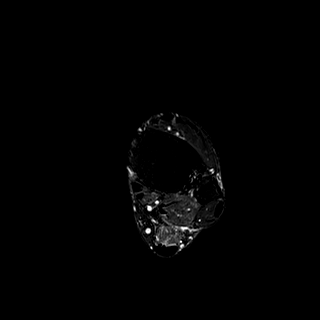
[im 36/36]
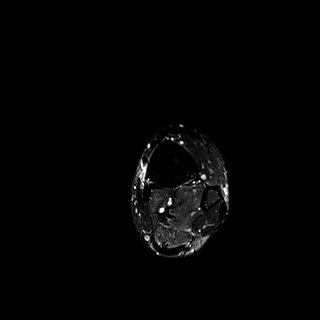

[Series 7: T2 · coronal · left · 3.0mm · 0.62mm/px · 8 of 40 slices shown (1 of 2)]
[im 1/40]
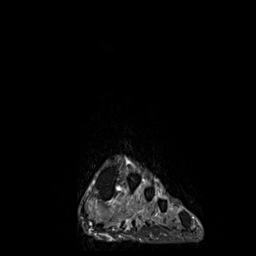
[im 6/40]
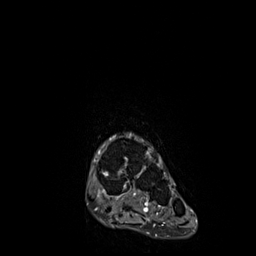
[im 12/40]
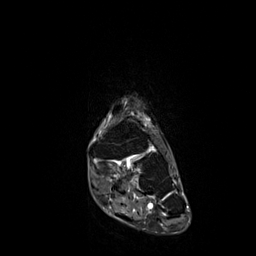
[im 17/40]
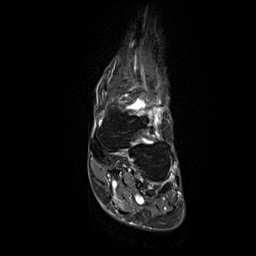
[im 23/40]
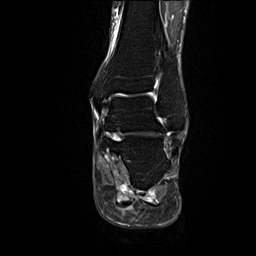
[im 28/40]
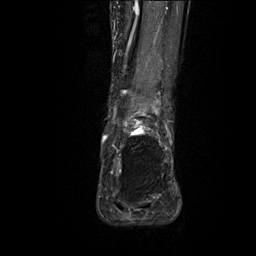
[im 34/40]
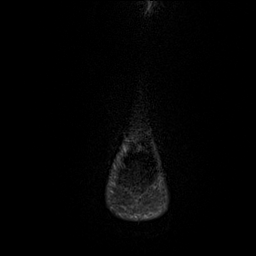
[im 40/40]
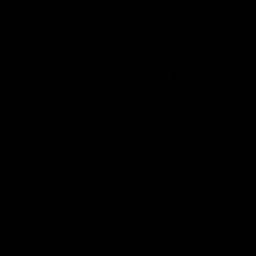

[Series 8: T1 · sagittal · left · 4.0mm · 0.70mm/px · 4 of 21 slices shown]
[im 1/21]
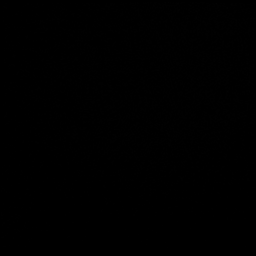
[im 7/21]
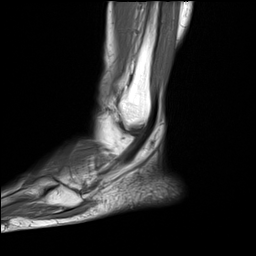
[im 14/21]
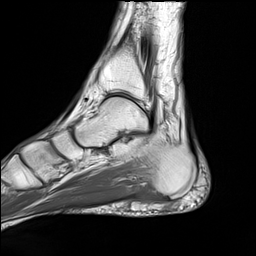
[im 21/21]
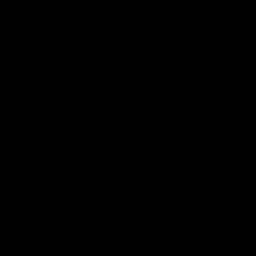

[Series 9: STIR · sagittal · left · 4.0mm · 0.35mm/px · 4 of 21 slices shown]
[im 1/21]
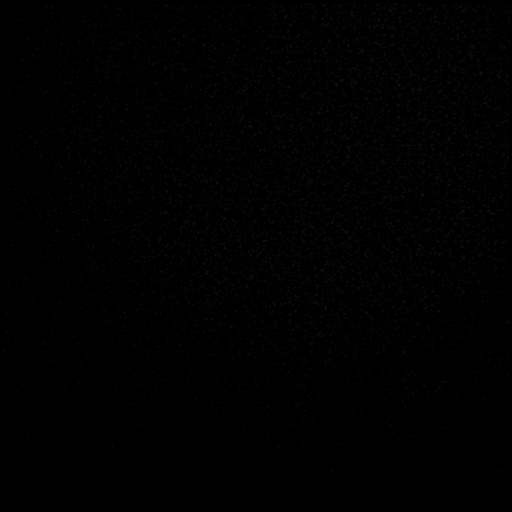
[im 7/21]
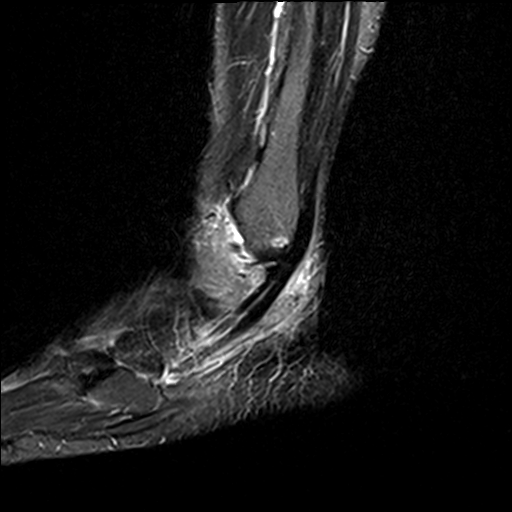
[im 14/21]
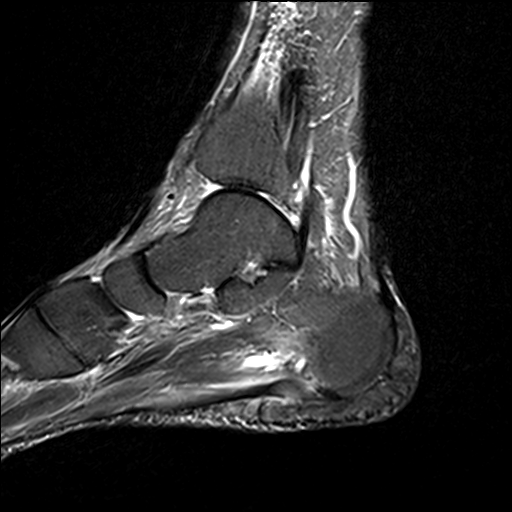
[im 21/21]
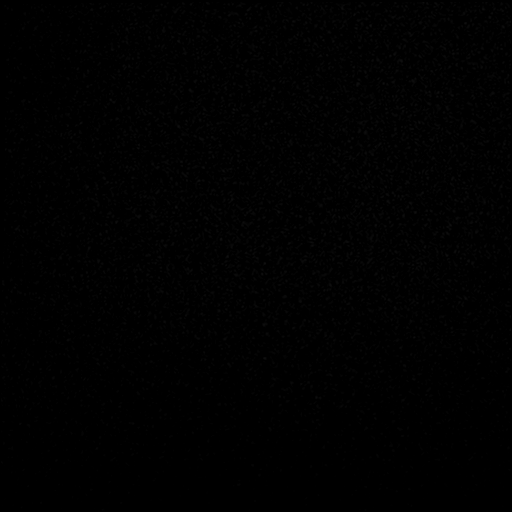

[Series 11: T2 · coronal · left · 3.0mm · 0.62mm/px · 8 of 40 slices shown (2 of 2)]
[im 1/40]
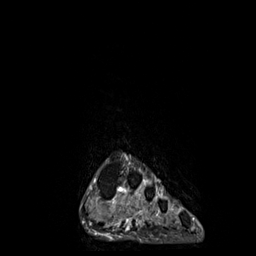
[im 6/40]
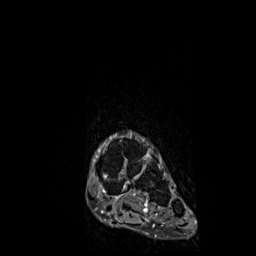
[im 12/40]
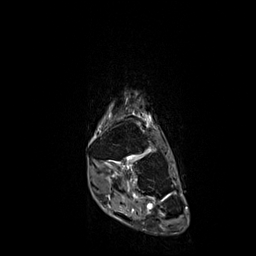
[im 17/40]
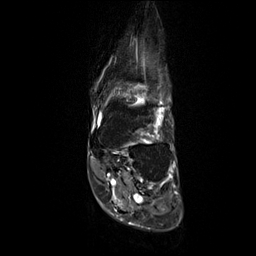
[im 23/40]
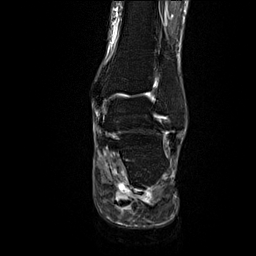
[im 28/40]
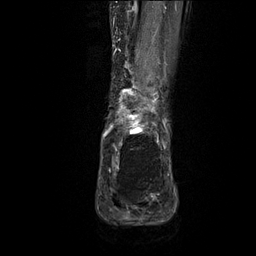
[im 34/40]
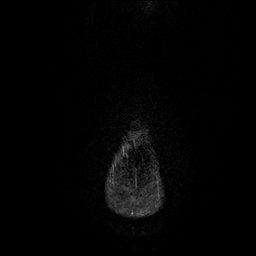
[im 40/40]
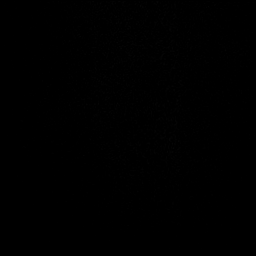

[40 of 40 positions shown; findings below may reference images not displayed]

FINDINGS: TENDONS

Peroneal: Peroneal longus tendon intact. Moderate tendinosis of the
peroneus brevis with a short-segment longitudinal split tear.

Posteromedial: Posterior tibial tendon intact. Flexor hallucis
longus tendon intact. Flexor digitorum longus tendon intact.

Anterior: Tibialis anterior tendon intact. Extensor hallucis longus
tendon intact Extensor digitorum longus tendon intact.

Achilles:  Intact. Edema in Kager's fat.

Plantar Fascia: Severe thickening and increased signal of the medial
band of the plantar fascia consistent with plantar fasciitis with a
high-grade partial-thickness tear.

LIGAMENTS

Lateral: Anterior talofibular ligament intact. Calcaneofibular
ligament intact. Posterior talofibular ligament intact. Anterior and
posterior tibiofibular ligaments intact.

Medial: Deltoid ligament intact. Spring ligament intact.

CARTILAGE

Ankle Joint: No joint effusion. Normal ankle mortise. No chondral
defect.

Subtalar Joints/Sinus Tarsi: Normal subtalar joints. No subtalar
joint effusion. Normal sinus tarsi.

Bones: No marrow signal abnormality.  No fracture or dislocation.

Soft Tissue: No fluid collection or hematoma. Muscles are normal
without edema or atrophy. Tarsal tunnel is normal.
IMPRESSION: 1. Severe plantar fasciitis of the medial band of the plantar fascia
with a high-grade partial-thickness tear.
2. Moderate tendinosis of the peroneus brevis with a short-segment
longitudinal split tear.

## 2022-12-18 ENCOUNTER — Inpatient Hospital Stay: Admit: 2022-12-18 | Payer: MEDICARE | Primary: Family Medicine

## 2022-12-18 DIAGNOSIS — E119 Type 2 diabetes mellitus without complications: Secondary | ICD-10-CM

## 2022-12-18 LAB — BASIC METABOLIC PANEL
Anion Gap: 3 mmol/L (ref 3.0–18)
BUN/Creatinine Ratio: 19 (ref 12–20)
BUN: 13 MG/DL (ref 7.0–18)
CO2: 29 mmol/L (ref 21–32)
Calcium: 9.1 MG/DL (ref 8.5–10.1)
Chloride: 111 mmol/L (ref 100–111)
Creatinine: 0.67 MG/DL (ref 0.6–1.3)
Est, Glom Filt Rate: >90 mL/min/{1.73_m2} (ref >60)
Glucose: 110 mg/dL — ABNORMAL HIGH (ref 74–99)
Potassium: 4.1 mmol/L (ref 3.5–5.5)
Sodium: 143 mmol/L (ref 136–145)

## 2022-12-18 LAB — CBC WITH AUTO DIFFERENTIAL
Basophils %: 1 % (ref 0–2)
Basophils Absolute: 0.1 10*3/uL (ref 0.0–0.1)
Eosinophils %: 3 % (ref 0–5)
Eosinophils Absolute: 0.2 10*3/uL (ref 0.0–0.4)
Hematocrit: 43 % (ref 35.0–45.0)
Hemoglobin: 13.1 g/dL (ref 12.0–16.0)
Immature Granulocytes %: 0 % (ref 0.0–0.5)
Immature Granulocytes Absolute: 0 10*3/uL (ref 0.00–0.04)
Lymphocytes %: 40 % (ref 21–52)
Lymphocytes Absolute: 2.7 10*3/uL (ref 0.9–3.6)
MCH: 27.9 PG (ref 24.0–34.0)
MCHC: 30.5 g/dL — ABNORMAL LOW (ref 31.0–37.0)
MCV: 91.7 FL (ref 78.0–100.0)
MPV: 12.9 FL — ABNORMAL HIGH (ref 9.2–11.8)
Monocytes %: 7 % (ref 3–10)
Monocytes Absolute: 0.5 10*3/uL (ref 0.05–1.2)
Neutrophils %: 48 % (ref 40–73)
Neutrophils Absolute: 3.2 10*3/uL (ref 1.8–8.0)
Nucleated RBCs: 0 PER 100 WBC
Platelets: 151 10*3/uL (ref 135–420)
RBC: 4.69 M/uL (ref 4.20–5.30)
RDW: 14 % (ref 11.6–14.5)
WBC: 6.7 10*3/uL (ref 4.6–13.2)
nRBC: 0 10*3/uL (ref 0.00–0.01)

## 2022-12-18 LAB — HEMOGLOBIN A1C
Estimated Avg Glucose: 111 mg/dL
Hemoglobin A1C: 5.5 % (ref 4.2–5.6)

## 2022-12-18 LAB — IRON AND TIBC
Iron % Saturation: 24 % (ref 20–50)
Iron: 83 ug/dL (ref 50–175)
TIBC: 345 ug/dL (ref 250–450)

## 2022-12-21 ENCOUNTER — Encounter: Payer: MEDICARE | Attending: Cardiovascular Disease | Primary: Family Medicine

## 2022-12-22 ENCOUNTER — Ambulatory Visit: Admit: 2022-12-22 | Discharge: 2022-12-22 | Payer: MEDICARE | Attending: Family Medicine | Primary: Family Medicine

## 2022-12-22 DIAGNOSIS — E119 Type 2 diabetes mellitus without complications: Secondary | ICD-10-CM

## 2022-12-22 NOTE — Progress Notes (Signed)
SUBJECTIVE  Ff-up    HTN- currently on aldactone  Hypokalemia- endo eval neg for hyperaldo, currently on aldactone and kcl 40 meqs TID with normal K levels.     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  Reviewed malabsorption labs mild iron deficiency, not currently taking oral iron    DM-diet controlled, she does not check glucose at home. She is no longer on metformin.  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 Says weight loss improved respirotary symptoms. denies cough, sob, wheezing. She says last albuterol use was a year ago. Usually heat triggers respiratory symptoms.    Chronic OA knee pain- following [facility] pain management, on prn oxycodone dr. Sterling Big    Laceration right brow sustained after a fall on 12/13/2022 when she was visiting  and tripped on a rug.  She shows me her discharge summary CT head scan she reports to be negative.    ROS:  See HPI, all others negative        Patient Active Problem List   Diagnosis Code    Chronic infection of sinus J32.9    Type 2 diabetes mellitus without complication (HCC) E11.9    Incontinence in female R32    Candidal intertrigo B37.2    Arthritis, multiple joint involvement M12.9    Essential hypertension I10    COPD (chronic obstructive pulmonary disease) (HCC) J44.9    Hyperlipidemia E78.5    Vitamin D deficiency E55.9    Internal hemorrhoids K64.8    Colon polyps K63.5    Hypokalemia E87.6    Hypomagnesemia E83.42    Syncope R55    Elevated troponin R77.8    Short gut syndrome K91.2    Hypocalcemia E83.51    Mild intermittent asthma J45.20    Thrombocytopenia, unspecified D69.6    Arthritis of knee M17.10    Non-compliance Z91.199    Arthrofibrosis of knee joint, 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         Current Outpatient Medications:     oxyCODONE HCl (OXY-IR) 10 MG immediate release tablet, , Disp: , Rfl:     diclofenac  sodium (VOLTAREN) 1 % GEL, , Disp: , Rfl:     spironolactone (ALDACTONE) 50 MG tablet, TAKE 1 TABLET EVERY DAY, Disp: 90 tablet, Rfl: 1    vitamin D (ERGOCALCIFEROL) 1.25 MG (50000 UT) CAPS capsule, TAKE 1 CAPSULE BY MOUTH EVERY 7 DAYS, Disp: 12 capsule, Rfl: 1    ferrous sulfate (IRON 325) 325 (65 Fe) MG tablet, Take 1 tablet by mouth daily (with breakfast), Disp: 90 tablet, Rfl: 1    blood glucose test strips (TRUE METRIX BLOOD GLUCOSE TEST) strip, Check glucose level once daily, Disp: 100 strip, Rfl: 3    TRUEplus Lancets 33G MISC, Check glucose level once daily, Disp: 100 each, Rfl: 3    Blood Glucose Monitoring Suppl (TRUE METRIX AIR GLUCOSE METER) w/Device KIT, Check glucose level once daily, Disp: 1 kit, Rfl: 0    aspirin 81 MG EC tablet, Take 1 tablet by mouth daily, Disp: , Rfl:     calcium citrate (CALCITRATE) 950 (200 Ca) MG tablet, Take 1 tablet by mouth daily, Disp: , Rfl:     cyanocobalamin 1000 MCG tablet, Take 1 tablet by mouth daily, Disp: , Rfl:     magnesium oxide (MAG-OX) 400 MG tablet, Take 1 tablet by mouth daily, Disp: , Rfl:  nystatin (MYCOSTATIN) 100000 UNIT/GM cream, Apply topically 2 times daily, Disp: , Rfl:     potassium chloride (KLOR-CON M) 20 MEQ extended release tablet, Take 2 tablets by mouth daily, Disp: , Rfl:     thiamine 100 MG tablet, Take 1 tablet by mouth daily, Disp: , Rfl:         Allergies   Allergen Reactions    Latex Rash    Acetaminophen-Codeine Nausea Only and Other (comments)     Nausea and pains in stomach    Codeine Other (comments)     Nausea and abd pain    Butrans [Buprenorphine] Rash    Iodine Rash    Lactose Diarrhea     Lactose intolerance    Other Plant, Educational psychologist, Environmental Hives     Conductive electrodes    Penicillins Itching       Past Medical History:   Diagnosis Date    Arthritis     knee, arms,     Asthma     resolved after gastric bypass    Chronic obstructive pulmonary disease (HCC)     no meds, since gastric bypass    Chronic pain     Diabetes  (HCC)     no meds    H/O sinusitis     Hx SBO     multiple surgeries prior to 2002    Hypercholesterolemia     Hypertension     Sleep apnea     NOT USING     Stress incontinence     Thrombocytopenia (HCC)     Toe fracture, left August 2015    4th toe       Social History     Socioeconomic History    Marital status: SINGLE     Spouse name: Not on file    Number of children: Not on file    Years of education: Not on file    Highest education level: Not on file   Occupational History    Not on file   Tobacco Use    Smoking status: Never    Smokeless tobacco: Never   Vaping Use    Vaping Use: Never used   Substance and Sexual Activity    Alcohol use: No     Alcohol/week: 0.0 standard drinks    Drug use: No    Sexual activity: Not Currently     Partners: Male   Other Topics Concern    Not on file   Social History Narrative    Retired Psychologist, occupational, 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 Denies history of smoking      Social Determinants of Psychologist, prison and probation services Strain: Low Risk     Difficulty of Paying Living Expenses: Not very hard   Food Insecurity: No Food Insecurity    Worried About Programme researcher, broadcasting/film/video in the Last Year: Never true    Ran Out of Food in the Last Year: Never true   Transportation Needs: Not on file   Physical Activity: Not on file   Stress: Not on file   Social Connections: Not on file   Intimate Partner Violence: Not on file   Housing Stability: Not on file       Family History   Problem Relation Age of Onset    Hypertension Mother     Stroke Mother     Diabetes Mother     Thyroid Disease Mother     Arthritis-rheumatoid Mother  Cancer Father         colon polpe    Cancer Maternal Aunt          OBJECTIVE    Physical Exam:     BP 134/89 (Site: Left Upper Arm, Position: Sitting, Cuff Size: Large Adult)   Pulse 70   Temp 97.7 F (36.5 C) (Skin)   Resp 16   Ht 1.575 m (5\' 2" )   Wt 91.4 kg (201 lb 9.6 oz)   SpO2 98%   BMI 36.87 kg/m         General: alert, well-appearing,obese,AA,  in no apparent distress or pain  Head: Right brow with 2 sutures removed today   neck: supple, no adenopathy palpated  CVS: normal rate, regular rhythm, distinct S1 and S2  Lungs:clear to ausculation bilaterally, no crackles, wheezing or rhonchi noted  Psych:  mood and affect normal  CMP:   Lab Results   Component Value Date/Time    NA 143 12/18/2022 08:10 AM    K 4.1 12/18/2022 08:10 AM    CL 111 12/18/2022 08:10 AM    CO2 29 12/18/2022 08:10 AM    BUN 13 12/18/2022 08:10 AM    CREATININE 0.67 12/18/2022 08:10 AM    GLUCOSE 110 12/18/2022 08:10 AM    GLUCOSE 107 12/10/2021 10:40 AM    CALCIUM 9.1 12/18/2022 08:10 AM    BILITOT 0.6 09/16/2022 11:38 AM    AST 22 09/16/2022 11:38 AM    ALT 27 09/16/2022 11:38 AM        CBC:   Lab Results   Component Value Date/Time    WBC 6.7 12/18/2022 08:10 AM    RBC 4.69 12/18/2022 08:10 AM    RBC 4.74 12/10/2021 10:40 AM    HGB 13.1 12/18/2022 08:10 AM    HCT 43.0 12/18/2022 08:10 AM    MCV 91.7 12/18/2022 08:10 AM    MCH 27.9 12/18/2022 08:10 AM    MCHC 30.5 12/18/2022 08:10 AM    RDW 14.0 12/18/2022 08:10 AM    PLT 151 12/18/2022 08:10 AM    MPV 12.9 12/18/2022 08:10 AM        Lipids   Lab Results   Component Value Date/Time    CHOL 152 09/16/2022 11:38 AM    TRIG 115 09/16/2022 11:38 AM    HDL 56 09/16/2022 11:38 AM    CHOLHDLRATIO 2.7 09/16/2022 11:38 AM         Imaging results last 24 hrs :MAM TOMO DIGITAL SCREEN BILATERAL    Result Date: 07/31/2021  BIRADS: 1-NEGATIVE BREAST DENSITY: C-The breasts are heterogeneously dense, which may obscure small masses. SCREENING MAMMOGRAM BILATERAL  3D tomosynthesis HISTORY: Screening. COMPARISON: 22 and other priors FINDINGS: 2D and 3D images were performed. Digital mammograms were performed. No suspicious microcalcifications, masses, or areas of architectural distortion. Interpretation performed in conjunction with computed assisted detection system.     No evidence of malignancy.  Suggest routine follow-up.      Imaging results  impression onlyMAM TOMO DIGITAL SCREEN BILATERAL    Result Date: 07/31/2021  No evidence of malignancy.  Suggest routine follow-up.     No orders to display       A1c:   Hemoglobin A1C   Date Value Ref Range Status   12/18/2022 5.5 4.2 - 5.6 % Final     Comment:     (NOTE)  HbA1C Interpretive Ranges  <5.7              Normal  5.7 - 6.4         Consider Prediabetes  >6.5              Consider Diabetes     09/16/2022 5.7 (H) 4.2 - 5.6 % Final     Comment:     (NOTE)  HbA1C Interpretive Ranges  <5.7              Normal  5.7 - 6.4         Consider Prediabetes  >6.5              Consider Diabetes     12/10/2021 5.8 (H) 4.8 - 5.6 % Final       ASSESSMENT/PLAN  Diagnoses and all orders for this visit:     Essential hypertension  controlled w/ hypoKalemia issues  cont aldactone to 50 mg  Cont  kcl 20 meqs T I D (had has chronic hypoK issues since gastric bypass)  DASH diet, BP Log    Hypokalemia  See #1    Diabetes mellitus type 2, diet-controlled (HCC)  Off meds after gastric bypass 02/2018  Monitoring   -     Hemoglobin A1C; Future  -     CMP  -     CBC     Mild intermittent asthma, unspecified whether complicated  Controlled  Cont prn albuterol      Pure hypercholesterolemia   off statin after wt loss/gastric bypass 2019  Monitoring    BMI 35-39 with comorbidity HCC  S/p gastric bypass    Vitamin D deficiency   s/p gastric bypass  Cont 50 k weekly  Monitoring    S/p gastric bypass  W/ dumping syndrome/symptoms of dehydration/hypoglycemia erratic  Advised increase hydration, fiber,  protein on each meal      Chronic pain of both knee  Following pain management dr. Sterling Big    Iron deficiency  Status post gastric bypass  For oral iron supplement  -     CBC with Auto Differential; Future  -     ferrous sulfate (IRON 325) 325 (65 Fe) MG tablet; Take 1 tablet by mouth daily (with breakfast)    At risk for falls  Right brow laceration sutures removed today, mechanical fall 12/13/2022  On the basis of positive falls risk screening,  assessment and plan is as follows: home safety tips provided.    Ff-up in 3 months, labs prior    Patient understands plan of care. Patient has provided input and agrees with goals.

## 2022-12-22 NOTE — Progress Notes (Signed)
"  Have you been to the ER, urgent care clinic since your last visit?  Hospitalized since your last visit?"    YES - When: approximately 8 days ago.  Where and Why: [facility] for fall .    "Have you seen or consulted any other health care providers outside of [facility] since your last visit?"    YES - When: approximately 9 days ago.  Where and Why: [facility] for fall .      Diabetic eye exam requested from dr. Reed Pandy, letter in notes.       Click Here for Release of Records Request

## 2022-12-25 ENCOUNTER — Ambulatory Visit
Admit: 2022-12-25 | Discharge: 2022-12-25 | Payer: MEDICARE | Attending: Cardiovascular Disease | Primary: Family Medicine

## 2022-12-25 DIAGNOSIS — I10 Essential (primary) hypertension: Secondary | ICD-10-CM

## 2022-12-25 NOTE — Progress Notes (Signed)
[facility]    Cheryl Paul is 72 y.o. female     Patient is here today for   Denies prior history of MI or CHF   Patient had syncopal episode in 02/2022 and workup including echo, MRI and event monitor was unremarkable and low risk in nature.    Patient does not have any cardiac symptoms at this time.  Denies any chest pain or chest concerning for angina.  No palpitation, presyncope syncope.  Had a mechanical fall once while trying to climb stairs and tripping on the step.  No lower extremity swelling, orthopnea or PND.    Past Medical History:   Diagnosis Date    Arthritis     knee, arms,     Asthma     resolved after gastric bypass    Chronic obstructive pulmonary disease (HCC)     no meds, since gastric bypass    Chronic pain     Diabetes (HCC)     no meds    H/O gastric bypass 2018    H/O sinusitis     Hx SBO     multiple surgeries prior to 2002    Hypercholesterolemia     Hypertension     Sleep apnea     NOT USING     Stress incontinence     Thrombocytopenia (HCC)     Toe fracture, left August 2015    4th toe       Review of Systems:  Cardiac symptoms as noted above in HPI. All others negative.  Denies fatigue, malaise, skin rash, joint pain, blurring vision, photophobia, neck pain, hemoptysis, chronic cough, nausea, vomiting, hematuria, burning micturition, BRBPR, chronic headaches.    Current Outpatient Medications   Medication Sig    oxyCODONE HCl (OXY-IR) 10 MG immediate release tablet     diclofenac sodium (VOLTAREN) 1 % GEL     spironolactone (ALDACTONE) 50 MG tablet TAKE 1 TABLET EVERY DAY    vitamin D (ERGOCALCIFEROL) 1.25 MG (50000 UT) CAPS capsule TAKE 1 CAPSULE BY MOUTH EVERY 7 DAYS    ferrous sulfate (IRON 325) 325 (65 Fe) MG tablet Take 1 tablet by mouth daily (with breakfast)    blood glucose test strips (TRUE METRIX BLOOD GLUCOSE TEST) strip Check glucose level once daily    TRUEplus Lancets 33G MISC Check glucose level once  daily    Blood Glucose Monitoring Suppl (TRUE METRIX AIR GLUCOSE METER) w/Device KIT Check glucose level once daily    aspirin 81 MG EC tablet Take 1 tablet by mouth daily    calcium citrate (CALCITRATE) 950 (200 Ca) MG tablet Take 1 tablet by mouth daily    cyanocobalamin 1000 MCG tablet Take 1 tablet by mouth daily    magnesium oxide (MAG-OX) 400 MG tablet Take 1 tablet by mouth daily    nystatin (MYCOSTATIN) 100000 UNIT/GM cream Apply topically 2 times daily    potassium chloride (KLOR-CON M) 20 MEQ extended release tablet Take 2 tablets by mouth daily    thiamine 100 MG tablet Take 1 tablet by mouth daily     No current facility-administered medications for this visit.       Past Surgical History:   Procedure Laterality Date    CARPAL TUNNEL RELEASE Bilateral     CHOLECYSTECTOMY      GASTRIC BYPASS SURGERY  02/21/2018    GYN      HERNIA REPAIR      abdomen    HYSTERECTOMY (CERVIX STATUS UNKNOWN)  tubes tied    ORTHOPEDIC SURGERY      catrpal tunnel right and left    POLYPECTOMY  04/18/2014    PR UNLISTED PROCEDURE ABDOMEN PERITONEUM & OMENTUM      4 surgeries for blockages       Allergies and Sensitivities:  Allergies   Allergen Reactions    Latex Rash    Acetaminophen-Codeine Nausea Only and Other (See Comments)     Nausea and pains in stomach    Codeine Other (See Comments)     Nausea and abd pain    Buprenorphine Rash    Iodine Rash    Lactose Diarrhea     Lactose intolerance    Penicillins Itching    Seasonal Hives     Conductive electrodes    Adhesive Tape      Itching, redness       Family History:  Family History   Problem Relation Age of Onset    Rheum Arthritis Mother     Thyroid Disease Mother     Diabetes Mother     Stroke Mother     Hypertension Mother     Cancer Father         colon polpe    Breast Cancer Maternal Aunt        Social History:  Social History     Tobacco Use    Smoking status: Never    Smokeless tobacco: Never   Vaping Use    Vaping Use: Never used   Substance Use Topics     Alcohol use: Never    Drug use: Never     She  reports that she has never smoked. She has never used smokeless tobacco.  She  reports no history of alcohol use.    Physical Exam:  BP Readings from Last 3 Encounters:   12/25/22 (!) 120/100   12/22/22 134/89   09/21/22 136/82         Pulse Readings from Last 3 Encounters:   12/25/22 75   12/22/22 70   09/21/22 73          Wt Readings from Last 3 Encounters:   12/25/22 90.7 kg (200 lb)   12/22/22 91.4 kg (201 lb 9.6 oz)   09/21/22 92.5 kg (204 lb)       Constitutional: Oriented to person, place, and time.   HENT: Head: Normocephalic and atraumatic.   Neck: No JVD present.  Cardiovascular: Regular rhythm.   No murmur, gallop or rubs appreciated  Lung: Breath sounds normal. No respiratory distress. No ronchi or rales appreciated  Abdominal: No tenderness. No rebound and no guarding.   Musculoskeletal: There is no lower extremity edema. No cynosis    LABS:   @  Lab Results   Component Value Date/Time    WBC 6.7 12/18/2022 08:10 AM    HGB 13.1 12/18/2022 08:10 AM    HCT 43.0 12/18/2022 08:10 AM    PLT 151 12/18/2022 08:10 AM    MCV 91.7 12/18/2022 08:10 AM     Lab Results   Component Value Date/Time    NA 143 12/18/2022 08:10 AM    K 4.1 12/18/2022 08:10 AM    CL 111 12/18/2022 08:10 AM    CO2 29 12/18/2022 08:10 AM    BUN 13 12/18/2022 08:10 AM    CREATININE 0.67 12/18/2022 08:10 AM    GLUCOSE 110 12/18/2022 08:10 AM    GLUCOSE 107 12/10/2021 10:40 AM    CALCIUM 9.1 12/18/2022 08:10  AM           Latest Ref Rng & Units 09/16/2022    11:38 AM 02/04/2022    10:26 AM 12/10/2021    10:40 AM 07/24/2021     8:08 AM 04/18/2021     8:40 AM   Lipids   Chol <200 MG/DL 161   096      HDL 40 - 60 MG/DL 56   61      LDL Calc 0 - 100 MG/DL 73   87      VLDL Calc MG/DL 23   18      Trig <045 MG/DL 409   89      Ratio 0 - 5.0 2.7   2.7      ALT 13 - 56 U/L 27  22  14  22  23     AST 10 - 38 U/L 22  15  12  15  17     LDL 0 - 100 MG/DL 73   87        Lab Results   Component Value Date/Time    ALT  27 09/16/2022 11:38 AM     Hemoglobin A1C   Date Value Ref Range Status   12/18/2022 5.5 4.2 - 5.6 % Final     Comment:     (NOTE)  HbA1C Interpretive Ranges  <5.7              Normal  5.7 - 6.4         Consider Prediabetes  >6.5              Consider Diabetes       Lab Results   Component Value Date    TSH 3.33 05/15/2018       ECHO (TTE) COMPLETE (PRN CONTRAST/BUBBLE/STRAIN/3D) 02/18/2022  6:49 PM (Final)  Interpretation Summary    Study Details: Technical qualifier - other: Technically difficult due to sensitivity in apical region.    Left Ventricle: Normal left ventricular systolic function with a visually estimated EF of 55 - 60%. Left ventricle size is normal. Normal wall thickness. Normal wall motion. Normal diastolic function.    Tricuspid Valve: Unable to assess RVSP due to inadequate or insignificant tricuspid regurgitation.    IMPRESSION & PLAN:  Cheryl Paul  is 72 y.o. female with    Possible syncope:  Episode happened in 02/2022 while playing bingo.    Echo in 02/2022: Low risk, normal EF, no major valvular heart disease  MRI in 01/2022: Chronic microvascular disease but no otherwise abnormality  Event 04/2022: Sinus Rhythm with average heart rate of 73 bpm, appropriate heart rate variation, No obvious evidence of atrial fibrillation, heart block or pauses noted, 2,633 PACs with PAC burden of 1%    No palpitation, presyncope syncope since last visit.  Continue with clinical observation    Hypertension: BP 120/92.  Has not taken blood pressure medication this morning yet.  Currently on spironolactone 25 mg daily.  Salt restriction, DASH diet and weight loss advised    Obesity: Status post gastric bypass surgery in 2018.  Maximum weight was 300 pounds.    This plan was discussed with patient who is in agreement.    Thank you for allowing me to participate in patient care. Please feel free to call me if you have any question or concern.     Earl Lites, MD  Please note: This document has been produced  using voice recognition software. Unrecognized errors in transcription  may be present.

## 2023-01-11 ENCOUNTER — Ambulatory Visit: Payer: Medicare Other | Admitting: Neurology

## 2023-01-11 NOTE — Progress Notes (Unsigned)
BH MD/PA/NP OP Progress Note  01/12/2023 11:05 AM Brittany [name]  MRN:  [id number]  Visit Diagnosis:    ICD-10-CM   1. MDD (major depressive disorder), recurrent episode, mild (HCC)  F33.0         Assessment: Brittany [name] is a 72 y.o. female with a history of MDD who presented to [facility] at [facility] for initial evaluation on 02/11/22.  Patient had followed with Dr. Donell Beers in the past.   At initial evaluation patient reported experiencing increased mood lability with periods of depressed mood, tearfulness, decreased energy secondary to interpersonal stressors most often around her son.  Patient denied any SI/HI or thoughts of self-harm.  She denied any paranoia, delusions, AVH, or manic symptoms.  She also endorsed experiencing periods of increased anger out of proportion to triggers.  During which time she turns the anger internal and does not act out verbally or physically.  Patient felt that since decreasing Celexa 3 years ago she has been unstable.  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  It also showed mild global parenchymal volume loss and chronic white matter microangiopathy.   Brittany [name] presents for follow-up evaluation. Today, 01/12/23, patient reports that things have been stable over the past month.  Her new job is started and has gone well along with the book club.  She is also getting along well with her kids.  She denies any medication side effects and will continue on her current regimen and follow-up in 2 months.  Plan:  - Continue Lexapro 10 mg QD - Continue trazodone 100 mg QHS  - Pregabalin 50 mg at bedtime for small fiber neuropathy, managed by her neurologist - Labs reviewed, TSH WNL - Continue with [name] for therapy - Had went to a grief group, not a good fit. Thinking about a divorce group - Collateral obtained from Dr. Donell Beers -  Follow up in 2 months    Chief Complaint:  Chief Complaint  Patient presents with   Follow-up   HPI: Brittany [name] presents reporting that things have been going well for her over the past month.  She started to work a couple weeks ago and things seem to be going fairly well there.  She only had an issue with one employee who is trying to correct her. Management spoke with this employee today, and Brittany [name] hopes that that will result in improvement.  The book club has also been going well and she is enjoying taking part in it.  Brittany [name] also got to spend the day with [name] really enjoyed especially since Carley Hammed was interested in seeing where Denita's mother had been buried.  Brittany [name] notes the only real negative was that a very close friend of hers passed away from cancer the past month but she feels that she is handling it well.  She has continued to take her medication consistently and denies any adverse side effects.  She reports that sleep has been good.  Past Psychiatric History: Patient reports first experiencing symptoms in 1998 with a final diagnosis coming in 1999.  She was voluntarily hospitalized in 2000 with severe depression.  She denies any active SI but does note passive SI with no intent or plan at that time.  Patient saw Dr. Janann August, Dr. Senaida Ores, and Dr. Donell Beers during which time she was prescribed Depakote, Celexa, nortriptyline, trazodone, and clorazepate.  She reports being stable until 2020 when Celexa was decreased to 20 mg.  Cymbalta was started and then discontinued due to weight gain.  Started Lexapro on 04/13/2022  Patient denies any history of substance use.  Past Medical History:  Past Medical History:  Diagnosis Date   Allergic genetic state    Anemia    Anxiety    Arthritis    Cataract    Chicken pox    Clostridioides difficile infection 09/05/2018   Complication of anesthesia    Depression    Family history of adverse reaction to anesthesia    mother -  PONV   GERD (gastroesophageal reflux disease)    H/O hiatal hernia    History of GI bleed    Hypercholesteremia    Hyperlipidemia    Hypothyroidism    Osteoporosis    PONV (postoperative nausea and vomiting)    Stroke (HCC) 11/2017   Thyroid disease    Wears dentures    partial upper, full lower    Past Surgical History:  Procedure Laterality Date   ABDOMINAL HYSTERECTOMY  05/18/2000   total   BRAVO PH STUDY  05/26/2012   Procedure: BRAVO PH STUDY;  Surgeon: Shirley Friar, MD;  Location: [facility];  Service: Endoscopy;  Laterality: N/A;   CARPOMETACARPAL (CMC) FUSION OF THUMB Left 10/13/2022   Procedure: Left thumb carpometacarpal arthroplasty;  Surgeon: Kennedy Bucker, MD;  Location: [facility];  Service: Orthopedics;  Laterality: Left;   CATARACT EXTRACTION     CHOLECYSTECTOMY  05/18/2004   COLONOSCOPY WITH PROPOFOL N/A 10/24/2018   Procedure: COLONOSCOPY WITH PROPOFOL;  Surgeon: Scot Jun, MD;  Location: [facility] ENDOSCOPY;  Service: Endoscopy;  Laterality: N/A;   COLONOSCOPY WITH PROPOFOL N/A 08/14/2021   Procedure: COLONOSCOPY WITH PROPOFOL;  Surgeon: [name], DO;  Location: [facility] ENDOSCOPY;  Service: Gastroenterology;  Laterality: N/A;   detatched retina surgery     ESOPHAGOGASTRODUODENOSCOPY  05/26/2012   Procedure: ESOPHAGOGASTRODUODENOSCOPY (EGD);  Surgeon: Shirley Friar, MD;  Location: [facility];  Service: Endoscopy;  Laterality: N/A;   ESOPHAGOGASTRODUODENOSCOPY  05/30/2012   Procedure: ESOPHAGOGASTRODUODENOSCOPY (EGD);  Surgeon: Florencia Reasons, MD;  Location: [facility];  Service: Endoscopy;  Laterality: N/A;   ESOPHAGOGASTRODUODENOSCOPY N/A 08/31/2012   Procedure: ESOPHAGOGASTRODUODENOSCOPY (EGD);  Surgeon: Graylin Shiver, MD;  Location[facility] ENDOSCOPY;  Service: Endoscopy;  Laterality: N/A;   ESOPHAGOGASTRODUODENOSCOPY (EGD) WITH PROPOFOL N/A 10/24/2018   Procedure: ESOPHAGOGASTRODUODENOSCOPY (EGD) WITH PROPOFOL;  Surgeon:  Scot Jun, MD;  Location: [facility] ENDOSCOPY;  Service: Endoscopy;  Laterality: N/A;   ESOPHAGOGASTRODUODENOSCOPY (EGD) WITH PROPOFOL N/A 08/05/2020   Procedure: ESOPHAGOGASTRODUODENOSCOPY (EGD) WITH PROPOFOL;  Surgeon: Toledo, Boykin Nearing, MD;  Location: [facility] ENDOSCOPY;  Service: Gastroenterology;  Laterality: N/A;   ESOPHAGOGASTRODUODENOSCOPY (EGD) WITH PROPOFOL N/A 08/14/2021   Procedure: ESOPHAGOGASTRODUODENOSCOPY (EGD) WITH PROPOFOL;  Surgeon: [name], DO;  Location: [facility] ENDOSCOPY;  Service: Gastroenterology;  Laterality: N/A;   EYE SURGERY     FOOT SURGERY Right    HIP SURGERY  1970s   "hip sunk in"   left eye surgery for detached retina     LIVER BIOPSY  2007   NECK SURGERY  05/18/2010   PLANTAR FASCIA RELEASE Left 10/10/2020   Procedure: ENDOSCOPIC PLANTAR FASC. RELEASE;  Surgeon: Rosetta Posner, DPM;  Location: [facility];  Service: Podiatry;  Laterality: Left;  ANESTHESIA- CHOICE   ROTATOR CUFF REPAIR  03/18/2012   right side   THYROID SURGERY     nodule removed, right  side   TMJ ARTHROPLASTY  05/18/1990   TUBAL LIGATION  05/19/1979    Family Psychiatric History: Both of her kids see psychiatrists for ADHD  Family History:  Family History  Problem Relation Age of Onset   Hypertension Mother    Kidney disease Mother    Heart attack Mother    Osteoporosis Mother    Lung cancer Father    Cancer Sister    Leukemia Sister    Colon polyps Sister    Ovarian cancer Paternal Aunt    Ovarian cancer Maternal Grandmother     Social History:  Social History   Socioeconomic History   Marital status: Divorced    Spouse name: Not on file   Number of children: 2   Years of education: Not on file   Highest education level: Not on file  Occupational History   Not on file  Tobacco Use   Smoking status: Former    Current packs/day: 0.00    Average packs/day: 1.5 packs/day for 20.0 years (30.0 ttl pk-yrs)    Types: Cigarettes    Start date: 02/05/1988     Quit date: 02/05/2008    Years since quitting: 14.9   Smokeless tobacco: Never  Vaping Use   Vaping status: Never Used  Substance and Sexual Activity   Alcohol use: No   Drug use: No   Sexual activity: Not on file  Other Topics Concern   Not on file  Social History Narrative   Left Handed    Lives in a townhouse       Are you currently employed ?    What is your current occupation? retired   Do you live at home alone?yes   Who lives with you?    What type of home do you live in: 1 story or 2 story? two   Caffeine none    Social Determinants of Health   Financial Resource Strain: Low Risk  (03/09/2022)   Received from [facility], [facility]   Overall Financial Resource Strain (CARDIA)    Difficulty of Paying Living Expenses: Not very hard  Food Insecurity: Food Insecurity Present (03/09/2022)   Received from [facility], [facility]   Hunger Vital Sign    Worried About Running Out of Food in the Last Year: Sometimes true    Ran Out of Food in the Last Year: Never true  Transportation Needs: No Transportation Needs (03/09/2022)   Received from [facility], [facility]   [facility] - Transportation    In the past 12 months, has lack of transportation kept you from medical appointments or from getting medications?: No    Lack of Transportation (Non-Medical): No  Physical Activity: Not on file  Stress: Not on file  Social Connections: Unknown (09/30/2021)   Received from [facility]   Social Network    Social Network: Not on file    Allergies:  Allergies  Allergen Reactions   Lithium Anaphylaxis   Morphine Sulfate Nausea And Vomiting   Fentanyl Other (See Comments)    Extreme Sedation  Other reaction(s): Lethargy (intolerance)    Morphine Nausea And Vomiting   Pseudoephedrine Hypertension        Pseudoephedrine Hcl Nausea Only and Hypertension     Current Medications: Current Outpatient Medications  Medication Sig Dispense Refill   atorvastatin (LIPITOR) 40 MG tablet Take 40 mg by mouth daily.     betamethasone dipropionate 0.05 %  cream Apply topically daily.     brimonidine (ALPHAGAN) 0.15 % ophthalmic solution SMARTSIG:In Eye(s)     Cholecalciferol 50 MCG (2000 UT) TABS Take 2,000 Units by mouth daily.     DORZOLAMIDE HCL OP Apply to eye 2 (two) times daily.     escitalopram (LEXAPRO) 10 MG tablet Take 1 tablet (10 mg total) by mouth daily. 90 tablet 1   HYDROcodone-acetaminophen (NORCO/VICODIN) 5-325 MG tablet Take 1 tablet by mouth every 4 (four) hours as needed for moderate pain. 20 tablet 0   levothyroxine (SYNTHROID, LEVOTHROID) 25 MCG tablet Take 25 mcg by mouth every morning.      montelukast (SINGULAIR) 10 MG tablet Take 10 mg by mouth at bedtime.     pantoprazole (PROTONIX) 40 MG tablet Take 40 mg by mouth daily.     pregabalin (LYRICA) 50 MG capsule Take 1 capsule (50 mg total) by mouth 2 (two) times daily. 60 capsule 5   timolol (TIMOPTIC) 0.5 % ophthalmic solution SMARTSIG:In Eye(s)     traZODone (DESYREL) 100 MG tablet Take 1 tablet (100 mg total) by mouth at bedtime. 90 tablet 1   vitamin B-12 (CYANOCOBALAMIN) 1000 MCG tablet Take 1,000 mcg by mouth daily.     No current facility-administered medications for this visit.     Musculoskeletal: Strength & Muscle Tone: within normal limits Gait & Station: normal Patient leans: N/A  Psychiatric Specialty Exam: Review of Systems  Blood pressure 139/76, pulse 76, height 5\' 5"  (1.651 m), weight 154 lb (69.9 kg).Body mass index is 25.63 kg/m.  General Appearance: Well Groomed  Eye Contact:  Good  Speech:  Clear and Coherent and Normal Rate  Volume:  Normal  Mood:  Euthymic  Affect:  Appropriate and Congruent  Thought Process:  Coherent and Goal Directed  Orientation:  Full (Time, Place, and Person)  Thought Content: Logical   Suicidal Thoughts:  No   Homicidal Thoughts:  No  Memory:  NA  Judgement:  Good  Insight:  Fair  Psychomotor Activity:  Normal  Concentration:  Concentration: Good  Recall:  Good  Fund of Knowledge: Good  Language: Good  Akathisia:  NA    AIMS (if indicated): not done  Assets:  Communication Skills Desire for Improvement Housing Leisure Time Resilience Transportation  ADL's:  Intact  Cognition: WNL  Sleep:  Good   Metabolic Disorder Labs: Lab Results  Component Value Date   HGBA1C 5.7 (H) 11/28/2017   MPG 116.89 11/28/2017   No results found for: "PROLACTIN" Lab Results  Component Value Date   CHOL 180 11/28/2017   TRIG 128 11/28/2017   HDL 31 (L) 11/28/2017   CHOLHDL 5.8 11/28/2017   VLDL 26 11/28/2017   LDLCALC 123 (H) 11/28/2017   Lab Results  Component Value Date   TSH 1.180 08/19/2021   TSH 4.830 (H) 08/31/2012    Therapeutic Level Labs: No results found for: "LITHIUM" No results found for: "VALPROATE" No results found for: "CBMZ"   Screenings: PHQ2-9    Flowsheet Row [facility]/20[facility]  PHQ-2 Total Score 1      Flowsheet Row Admission (Discharged) from 10/13/2022 in [facility] PERIOP Admission (Discharged) from 08/14/2021 in [facility] END[facility] ED from 05/30/2021 in [facility] Department at [facility]  C-SSRS RISK CATEGORY No Risk No Risk No Risk       Collaboration of Care: Collaboration of Care: Medication Management AEB medication prescription  40  minutes were spent in chart review, interview, psycho education, counseling, medical decision making, coordination of care and long-term prognosis.  Patient was given opportunity to ask question and all concerns and questions were addressed and answers. Excluding separately billable services.  Patient/Guardian was advised Release of Information must be obtained prior to any record release in  order to collaborate their care with an outside provider. Patient/Guardian was advised if they have not already done so to contact the registration department to sign all necessary forms in order for [place] to release information regarding their care.   Consent: Patient/Guardian gives verbal consent for treatment and assignment of benefits for services provided during this visit. Patient/Guardian expressed understanding and agreed to proceed.    Stasia Cavalier, MD 01/12/2023, 11:05 AM

## 2023-01-12 ENCOUNTER — Other Ambulatory Visit: Payer: Self-pay

## 2023-01-12 ENCOUNTER — Ambulatory Visit (HOSPITAL_BASED_OUTPATIENT_CLINIC_OR_DEPARTMENT_OTHER): Payer: Medicare Other | Admitting: Psychiatry

## 2023-01-12 ENCOUNTER — Encounter (HOSPITAL_COMMUNITY): Payer: Self-pay | Admitting: Psychiatry

## 2023-01-12 VITALS — BP 139/76 | HR 76 | Ht 65.0 in | Wt 154.0 lb

## 2023-01-12 DIAGNOSIS — F330 Major depressive disorder, recurrent, mild: Secondary | ICD-10-CM

## 2023-02-03 ENCOUNTER — Other Ambulatory Visit (HOSPITAL_COMMUNITY): Payer: Self-pay | Admitting: Psychiatry

## 2023-02-03 DIAGNOSIS — F330 Major depressive disorder, recurrent, mild: Secondary | ICD-10-CM

## 2023-02-05 ENCOUNTER — Other Ambulatory Visit (HOSPITAL_COMMUNITY): Payer: Self-pay | Admitting: Psychiatry

## 2023-02-05 DIAGNOSIS — F330 Major depressive disorder, recurrent, mild: Secondary | ICD-10-CM

## 2023-03-05 NOTE — Telephone Encounter (Signed)
This patient contacted the office for the following prescriptions to be refilled:    Medication requested :   Requested Prescriptions     Pending Prescriptions Disp Refills    vitamin D (ERGOCALCIFEROL) 1.25 MG (50000 UT) CAPS capsule [Pharmacy Med Name: Vitamin D (Ergocalciferol) Oral Capsule 1.25 MG (50000 UT)] 12 capsule 3     Sig: TAKE 1 CAPSULE BY MOUTH EVERY 7 DAYS        Last Refilled: 10/14/2022    Last Office Visit: 12/22/2022    Next Office Visit: 03/24/2023    Last Labs:12/18/2022, ordered 12/22/2022

## 2023-03-08 MED ORDER — VITAMIN D (ERGOCALCIFEROL) 1.25 MG (50000 UT) PO CAPS
1.25 | ORAL_CAPSULE | ORAL | 1 refills | Status: AC
Start: 2023-03-08 — End: ?

## 2023-03-15 NOTE — Progress Notes (Unsigned)
BH MD/PA/NP OP Progress Note  03/16/2023 6:39 PM Brittany [name]  MRN:  [id number]  Visit Diagnosis:    ICD-10-CM   1. MDD (major depressive disorder), recurrent episode, mild (HCC)  F33.0 escitalopram (LEXAPRO) 10 MG tablet    traZODone (DESYREL) 100 MG tablet      Assessment: Brittany [name] is a 72 y.o. female with a history of MDD who presented to [facility] at [facility] for initial evaluation on 02/11/22.  Patient had followed with Dr. Donell Beers in the past.   At initial evaluation patient reported experiencing increased mood lability with periods of depressed mood, tearfulness, decreased energy secondary to interpersonal stressors most often around her son.  Patient denied any SI/HI or thoughts of self-harm.  She denied any paranoia, delusions, AVH, or manic symptoms.  She also endorsed experiencing periods of increased anger out of proportion to triggers.  During which time she turns the anger internal and does not act out verbally or physically.  Patient felt that since decreasing Celexa 3 years ago she has been unstable.  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  It also showed mild global parenchymal volume loss and chronic white matter microangiopathy.   Brittany [name] presents for follow-up evaluation. Today, 03/16/23, patient reports that her mood has been stable over the past month.  Things at work have continued to go well along with things at home.  There was an offer from her son to move in though there was a questionable secondary motive for financial gain.  Patient currently prefer to live on her own and is able to care for her own wellbeing.  She denies any medication side effects and will continue on her current regimen and follow-up in a month.  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 Thinking about a divorce group - Collateral obtained from Dr. Donell Beers - Follow up in a month  Chief Complaint:  Chief Complaint  Patient presents with   Follow-up   HPI: Brittany [name] presents reporting that things have been going fairly well for her over he past couple months. Her son had wanted to buy a house and was planning to move and had offered to have Brittany [name] move in to the basement. Brittany [name] told him that she is not ready to move in with them. She enjoys her current living environment and is managing to care for herself. Toby later asked her to take out a loan for a 100,000 dollars to afford the down payment for the house, which Clydell refused. Her son ended up selling their current house and are moving forward with getting the new house.   Outside of that things with work and her book club have been going well. She has been taking her medication consistently and denies any concerns at this time.  Past Psychiatric History: Patient reports first experiencing symptoms in 1998 with a final diagnosis coming in 1999.  She was voluntarily hospitalized in 2000 with severe depression.  She denies any active SI but does note passive SI with no intent or plan at that time.  Patient saw Dr. Janann August, Dr. Senaida Ores, and Dr.  Plovsky during which time she was prescribed Depakote, Celexa, nortriptyline, trazodone, and clorazepate.  She reports being stable until 2020 when Celexa was decreased to 20 mg.  Cymbalta was started and then discontinued due to weight gain.  Started Lexapro on 04/13/2022  Patient denies any history of substance use.  Past Medical History:  Past Medical History:  Diagnosis Date   Allergic genetic state    Anemia    Anxiety    Arthritis    Cataract    Chicken pox    Clostridioides difficile infection 09/05/2018   Complication of  anesthesia    Depression    Family history of adverse reaction to anesthesia    mother - PONV   GERD (gastroesophageal reflux disease)    H/O hiatal hernia    History of GI bleed    Hypercholesteremia    Hyperlipidemia    Hypothyroidism    Osteoporosis    PONV (postoperative nausea and vomiting)    Stroke (HCC) 11/2017   Thyroid disease    Wears dentures    partial upper, full lower    Past Surgical History:  Procedure Laterality Date   ABDOMINAL HYSTERECTOMY  05/18/2000   total   BRAVO PH STUDY  05/26/2012   Procedure: BRAVO PH STUDY;  Surgeon: Shirley Friar, MD;  Location: [facility];  Service: Endoscopy;  Laterality: N/A;   CARPOMETACARPAL (CMC) FUSION OF THUMB Left 10/13/2022   Procedure: Left thumb carpometacarpal arthroplasty;  Surgeon: Kennedy Bucker, MD;  Location: [facility];  Service: Orthopedics;  Laterality: Left;   CATARACT EXTRACTION     CHOLECYSTECTOMY  05/18/2004   COLONOSCOPY WITH PROPOFOL N/A 10/24/2018   Procedure: COLONOSCOPY WITH PROPOFOL;  Surgeon: Scot Jun, MD;  Location: [facility] END[facility]COPY;  Service: Endoscopy;  Laterality: N/A;   COLONOSCOPY WITH PROPOFOL N/A 08/14/2021   Procedure: COLONOSCOPY WITH PROPOFOL;  Surgeon: [name], DO;  Location: [facility] END[facility]COPY;  Service: Gastroenterology;  Laterality: N/A;   detatched retina surgery     ESOPHAGOGASTRODUODENOSCOPY  05/26/2012   Procedure: ESOPHAGOGASTRODUODENOSCOPY (EGD);  Surgeon: Shirley Friar, MD;  Location: [facility];  Service: Endoscopy;  Laterality: N/A;   ESOPHAGOGASTRODUODENOSCOPY  05/30/2012   Procedure: ESOPHAGOGASTRODUODENOSCOPY (EGD);  Surgeon: Florencia Reasons, MD;  Location: [facility];  Service: Endoscopy;  Laterality: N/A;   ESOPHAGOGASTRODUODENOSCOPY N/A 08/31/2012   Procedure: ESOPHAGOGASTRODUODENOSCOPY (EGD);  Surgeon: Graylin Shiver, MD;  Location: [facility] ENDOSCOPY;  Service: Endoscopy;  Laterality: N/A;   ESOPHAGOGASTRODUODENOSCOPY (EGD) WITH  PROPOFOL N/A 10/24/2018   Procedure: ESOPHAGOGASTRODUODENOSCOPY (EGD) WITH PROPOFOL;  Surgeon: Scot Jun, MD;  Location: [facility] ENDOSCOPY;  Service: Endoscopy;  Laterality: N/A;   ESOPHAGOGASTRODUODENOSCOPY (EGD) WITH PROPOFOL N/A 08/05/2020   Procedure: ESOPHAGOGASTRODUODENOSCOPY (EGD) WITH PROPOFOL;  Surgeon: Toledo, Boykin Nearing, MD;  Location: [facility] ENDOSCOPY;  Service: Gastroenterology;  Laterality: N/A;   ESOPHAGOGASTRODUODENOSCOPY (EGD) WITH PROPOFOL N/A 08/14/2021   Procedure: ESOPHAGOGASTRODUODENOSCOPY (EGD) WITH PROPOFOL;  Surgeon: [name], DO;  Location: [facility] ENDOSCOPY;  Service: Gastroenterology;  Laterality: N/A;   EYE SURGERY     FOOT SURGERY Right    HIP SURGERY  1970s   "hip sunk in"   left eye surgery for detached retina     LIVER BIOPSY  2007   NECK SURGERY  05/18/2010   PLANTAR FASCIA RELEASE Left 10/10/2020   Procedure: ENDOSCOPIC PLANTAR FASC. RELEASE;  Surgeon: Rosetta Posner, DPM;  Location: [facility];  Service: Podiatry;  Laterality: Left;  ANESTHESIA- CHOICE   ROTATOR CUFF REPAIR  03/18/2012  right side   THYROID SURGERY     nodule removed, right side   TMJ ARTHROPLASTY  05/18/1990   TUBAL LIGATION  05/19/1979    Family Psychiatric History: Both of her kids see psychiatrists for ADHD  Family History:  Family History  Problem Relation Age of Onset   Hypertension Mother    Kidney disease Mother    Heart attack Mother    Osteoporosis Mother    Lung cancer Father    Cancer Sister    Leukemia Sister    Colon polyps Sister    Ovarian cancer Paternal Aunt    Ovarian cancer Maternal Grandmother     Social History:  Social History   Socioeconomic History   Marital status: Divorced    Spouse name: Not on file   Number of children: 2   Years of education: Not on file   Highest education level: Not on file  Occupational History   Not on file  Tobacco Use   Smoking status: Former    Current packs/day: 0.00    Average  packs/day: 1.5 packs/day for 20.0 years (30.0 ttl pk-yrs)    Types: Cigarettes    Start date: 02/05/1988    Quit date: 02/05/2008    Years since quitting: 15.1   Smokeless tobacco: Never  Vaping Use   Vaping status: Never Used  Substance and Sexual Activity   Alcohol use: No   Drug use: No   Sexual activity: Not on file  Other Topics Concern   Not on file  Social History Narrative   Left Handed    Lives in a townhouse       Are you currently employed ?    What is your current occupation? retired   Do you live at home alone?yes   Who lives with you?    What type of home do you live in: 1 story or 2 story? two   Caffeine none    Social Determinants of Health   Financial Resource Strain: Patient Declined (03/11/2023)   Received from [facility]   Overall Financial Resource Strain (CARDIA)    Difficulty of Paying Living Expenses: Patient declined  Food Insecurity: Patient Declined (03/11/2023)   Received from [facility]   Hunger Vital Sign    Worried About Running Out of Food in the Last Year: Patient declined    Ran Out of Food in the Last Year: Patient declined  Transportation Needs: Patient Declined (03/11/2023)   Received from [facility] - Transportation    In the past 12 months, has lack of transportation kept you from medical appointments or from getting medications?: Patient declined    Lack of Transportation (Non-Medical): Patient declined  Physical Activity: Not on file  Stress: Not on file  Social Connections: Unknown (09/30/2021)   Received from [facility]   Social Network    Social Network: Not on file    Allergies:  Allergies  Allergen Reactions   Lithium Anaphylaxis   Morphine Sulfate Nausea And Vomiting   Fentanyl Other (See Comments)    Extreme Sedation  Other reaction(s): Lethargy (intolerance)    Morphine Nausea And Vomiting   Pseudoephedrine Hypertension         Pseudoephedrine Hcl Nausea Only and Hypertension    Current Medications: Current Outpatient Medications  Medication Sig Dispense Refill   atorvastatin (LIPITOR) 40 MG tablet Take 40 mg by mouth daily.     betamethasone dipropionate 0.05 %  cream Apply topically daily.     brimonidine (ALPHAGAN) 0.15 % ophthalmic solution SMARTSIG:In Eye(s)     Cholecalciferol 50 MCG (2000 UT) TABS Take 2,000 Units by mouth daily.     DORZOLAMIDE HCL OP Apply to eye 2 (two) times daily.     escitalopram (LEXAPRO) 10 MG tablet Take 1 tablet (10 mg total) by mouth daily. 90 tablet 0   HYDROcodone-acetaminophen (NORCO/VICODIN) 5-325 MG tablet Take 1 tablet by mouth every 4 (four) hours as needed for moderate pain. 20 tablet 0   levothyroxine (SYNTHROID, LEVOTHROID) 25 MCG tablet Take 25 mcg by mouth every morning.      montelukast (SINGULAIR) 10 MG tablet Take 10 mg by mouth at bedtime.     pantoprazole (PROTONIX) 40 MG tablet Take 40 mg by mouth daily.     pregabalin (LYRICA) 50 MG capsule Take 1 capsule (50 mg total) by mouth 2 (two) times daily. 60 capsule 5   timolol (TIMOPTIC) 0.5 % ophthalmic solution SMARTSIG:In Eye(s)     traZODone (DESYREL) 100 MG tablet Take 1 tablet (100 mg total) by mouth at bedtime. 90 tablet 1   vitamin B-12 (CYANOCOBALAMIN) 1000 MCG tablet Take 1,000 mcg by mouth daily.     No current facility-administered medications for this visit.     Musculoskeletal: Strength & Muscle Tone: within normal limits Gait & Station: normal Patient leans: N/A  Psychiatric Specialty Exam: Review of Systems  There were no vitals taken for this visit.There is no height or weight on file to calculate BMI.  General Appearance: Well Groomed  Eye Contact:  Good  Speech:  Clear and Coherent and Normal Rate  Volume:  Normal  Mood:  Euthymic  Affect:  Appropriate and Congruent  Thought Process:  Coherent and Goal Directed  Orientation:  Full (Time, Place, and Person)  Thought Content: Logical    Suicidal Thoughts:  No  Homicidal Thoughts:  No  Memory:  NA  Judgement:  Good  Insight:  Fair  Psychomotor Activity:  Normal  Concentration:  Concentration: Good  Recall:  Good  Fund of Knowledge: Good  Language: Good  Akathisia:  NA    AIMS (if indicated): not done  Assets:  Communication Skills Desire for Improvement Housing Leisure Time Resilience Transportation  ADL's:  Intact  Cognition: WNL  Sleep:  Good   Metabolic Disorder Labs: Lab Results  Component Value Date   HGBA1C 5.7 (H) 11/28/2017   MPG 116.89 11/28/2017   No results found for: "PROLACTIN" Lab Results  Component Value Date   CHOL 180 11/28/2017   TRIG 128 11/28/2017   HDL 31 (L) 11/28/2017   CHOLHDL 5.8 11/28/2017   VLDL 26 11/28/2017   LDLCALC 123 (H) 11/28/2017   Lab Results  Component Value Date   TSH 1.180 08/19/2021   TSH 4.830 (H) 08/31/2012    Therapeutic Level Labs: No results found for: "LITHIUM" No results found for: "VALPROATE" No results found for: "CBMZ"   Screenings: PHQ2-9    Flowsheet Row [facility] from [facility]11/20[facility]  PHQ-2 Total Score 1      Flowsheet Row Admission (Discharged) from 10/13/2022 in [facility] PERIOP Admission (Discharged) from 08/14/2021 in [facility] ED from 05/30/2021 in [facility] Emergency Department at [facility]  C-SSRS RISK CATEGORY No Risk No Risk No Risk       Collaboration of Care: Collaboration of Care: Medication Management AEB medication prescription  40 minutes were  spent in chart review, interview, psycho education, counseling, medical decision making, coordination of care and long-term prognosis.  Patient was given opportunity to ask question and all concerns and questions were addressed and answers. Excluding separately billable services.  Patient/Guardian was advised Release of Information must be obtained prior to  any record release in order to collaborate their care with an outside provider. Patient/Guardian was advised if they have not already done so to contact the registration department to sign all necessary forms in order for [place] to release information regarding their care.   Consent: Patient/Guardian gives verbal consent for treatment and assignment of benefits for services provided during this visit. Patient/Guardian expressed understanding and agreed to proceed.    Stasia Cavalier, MD 03/16/2023, 6:39 PM

## 2023-03-16 ENCOUNTER — Encounter (HOSPITAL_COMMUNITY): Payer: Self-pay | Admitting: Psychiatry

## 2023-03-16 ENCOUNTER — Ambulatory Visit (HOSPITAL_BASED_OUTPATIENT_CLINIC_OR_DEPARTMENT_OTHER): Payer: Medicare Other | Admitting: Psychiatry

## 2023-03-16 DIAGNOSIS — F330 Major depressive disorder, recurrent, mild: Secondary | ICD-10-CM

## 2023-03-16 MED ORDER — ESCITALOPRAM OXALATE 10 MG PO TABS
10.0000 mg | ORAL_TABLET | Freq: Every day | ORAL | 0 refills | Status: DC
Start: 2023-03-16 — End: 2023-06-29

## 2023-03-16 MED ORDER — TRAZODONE HCL 100 MG PO TABS: 100.0000 mg | ORAL_TABLET | Freq: Every day | ORAL | 1 refills | Status: DC

## 2023-03-17 ENCOUNTER — Inpatient Hospital Stay: Admit: 2023-03-17 | Payer: MEDICARE | Primary: Family Medicine

## 2023-03-17 DIAGNOSIS — E119 Type 2 diabetes mellitus without complications: Secondary | ICD-10-CM

## 2023-03-17 LAB — COMPREHENSIVE METABOLIC PANEL
ALT: 23 U/L (ref 13–56)
AST: 18 U/L (ref 10–38)
Albumin/Globulin Ratio: 1.4 (ref 0.8–1.7)
Albumin: 3.7 g/dL (ref 3.4–5.0)
Alk Phosphatase: 76 U/L (ref 45–117)
Anion Gap: 2 mmol/L — ABNORMAL LOW (ref 3.0–18)
BUN/Creatinine Ratio: 17 (ref 12–20)
BUN: 12 mg/dL (ref 7.0–18)
CO2: 29 mmol/L (ref 21–32)
Calcium: 8.9 mg/dL (ref 8.5–10.1)
Chloride: 110 mmol/L (ref 100–111)
Creatinine: 0.7 mg/dL (ref 0.6–1.3)
Est, Glom Filt Rate: >90 mL/min/{1.73_m2} (ref >60)
Globulin: 2.7 g/dL (ref 2.0–4.0)
Glucose: 113 mg/dL — ABNORMAL HIGH (ref 74–99)
Potassium: 4.1 mmol/L (ref 3.5–5.5)
Sodium: 141 mmol/L (ref 136–145)
Total Bilirubin: 0.6 mg/dL (ref 0.2–1.0)
Total Protein: 6.4 g/dL (ref 6.4–8.2)

## 2023-03-17 LAB — CBC WITH AUTO DIFFERENTIAL
Basophils %: 1 % (ref 0–2)
Basophils Absolute: 0.1 10*3/uL (ref 0.0–0.1)
Eosinophils %: 2 % (ref 0–5)
Eosinophils Absolute: 0.1 10*3/uL (ref 0.0–0.4)
Hematocrit: 44.3 % (ref 35.0–45.0)
Hemoglobin: 13.6 g/dL (ref 12.0–16.0)
Immature Granulocytes %: 0 % (ref 0.0–0.5)
Immature Granulocytes Absolute: 0 10*3/uL (ref 0.00–0.04)
Lymphocytes %: 37 % (ref 21–52)
Lymphocytes Absolute: 2.6 10*3/uL (ref 0.9–3.6)
MCH: 28.1 pg (ref 24.0–34.0)
MCHC: 30.7 g/dL — ABNORMAL LOW (ref 31.0–37.0)
MCV: 91.5 fL (ref 78.0–100.0)
MPV: 12.6 fL — ABNORMAL HIGH (ref 9.2–11.8)
Monocytes %: 5 % (ref 3–10)
Monocytes Absolute: 0.4 10*3/uL (ref 0.05–1.2)
Neutrophils %: 55 % (ref 40–73)
Neutrophils Absolute: 3.8 10*3/uL (ref 1.8–8.0)
Nucleated RBCs: 0 /100{WBCs}
Platelets: 158 10*3/uL (ref 135–420)
RBC: 4.84 M/uL (ref 4.20–5.30)
RDW: 13.6 % (ref 11.6–14.5)
WBC: 6.9 10*3/uL (ref 4.6–13.2)
nRBC: 0 10*3/uL (ref 0.00–0.01)

## 2023-03-17 LAB — HEMOGLOBIN A1C
Estimated Avg Glucose: 117 mg/dL
Hemoglobin A1C: 5.7 % — ABNORMAL HIGH (ref 4.2–5.6)

## 2023-03-24 ENCOUNTER — Ambulatory Visit: Admit: 2023-03-24 | Discharge: 2023-03-24 | Payer: MEDICARE | Attending: Family Medicine | Primary: Family Medicine

## 2023-03-24 DIAGNOSIS — E119 Type 2 diabetes mellitus without complications: Secondary | ICD-10-CM

## 2023-03-24 NOTE — Progress Notes (Signed)
"  Have you been to the ER, urgent care clinic since your last visit?  Hospitalized since your last visit?"    NO    "Have you seen or consulted any other health care providers outside our system since your last visit?"    NO      "Have you had a diabetic eye exam?"    NO   Appointment scheduled in December   Date of last diabetic eye exam: 02/19/2021

## 2023-03-24 NOTE — Progress Notes (Signed)
SUBJECTIVE  Ff-up    HTN- currently on aldactone   Hypokalemia- endo eval neg for hyperaldo, currently on aldactone with  OTC kcl, normal K levels.     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  Reviewed malabsorption labs mild iron deficiency, not currently taking oral iron    DM-diet controlled, she does not check glucose at home. She is no longer on metformin.  Reviewed labs A1c 5.7>5.5>5.7     Asthma ?COPD- non smoker, previous welder. Says weight loss improved respirotary symptoms. denies cough, sob, wheezing. She says last albuterol use was a year ago. Usually heat triggers respiratory symptoms.    Chronic OA knee pain- following [facility] pain management, on prn oxycodone dr. Sterling Big    ROS:  See HPI, all others negative        Patient Active Problem List   Diagnosis Code    Chronic infection of sinus J32.9    Type 2 diabetes mellitus without complication (HCC) E11.9    Incontinence in female R32    Candidal intertrigo B37.2    Arthritis, multiple joint involvement M12.9    Essential hypertension I10    COPD (chronic obstructive pulmonary disease) (HCC) J44.9    Hyperlipidemia E78.5    Vitamin D deficiency E55.9    Internal hemorrhoids K64.8    Colon polyps K63.5    Hypokalemia E87.6    Hypomagnesemia E83.42    Syncope R55    Elevated troponin R77.8    Short gut syndrome K91.2    Hypocalcemia E83.51    Mild intermittent asthma J45.20    Thrombocytopenia, unspecified D69.6    Arthritis of knee M17.10    Non-compliance Z91.199    Arthrofibrosis of knee joint, 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         Current Outpatient Medications:     vitamin D (ERGOCALCIFEROL) 1.25 MG (50000 UT) CAPS capsule, TAKE 1 CAPSULE BY MOUTH EVERY 7 DAYS, Disp: 12 capsule, Rfl: 1    oxyCODONE HCl (OXY-IR) 10 MG immediate release tablet, , Disp: , Rfl:     diclofenac sodium (VOLTAREN) 1 % GEL, , Disp: , Rfl:     spironolactone  (ALDACTONE) 50 MG tablet, TAKE 1 TABLET EVERY DAY, Disp: 90 tablet, Rfl: 1    ferrous sulfate (IRON 325) 325 (65 Fe) MG tablet, Take 1 tablet by mouth daily (with breakfast), Disp: 90 tablet, Rfl: 1    blood glucose test strips (TRUE METRIX BLOOD GLUCOSE TEST) strip, Check glucose level once daily, Disp: 100 strip, Rfl: 3    TRUEplus Lancets 33G MISC, Check glucose level once daily, Disp: 100 each, Rfl: 3    Blood Glucose Monitoring Suppl (TRUE METRIX AIR GLUCOSE METER) w/Device KIT, Check glucose level once daily, Disp: 1 kit, Rfl: 0    aspirin 81 MG EC tablet, Take 1 tablet by mouth daily, Disp: , Rfl:     calcium citrate (CALCITRATE) 950 (200 Ca) MG tablet, Take 1 tablet by mouth daily, Disp: , Rfl:     cyanocobalamin 1000 MCG tablet, Take 1 tablet by mouth daily, Disp: , Rfl:     magnesium oxide (MAG-OX) 400 MG tablet, Take 1 tablet by mouth daily, Disp: , Rfl:     nystatin (MYCOSTATIN) 100000 UNIT/GM cream, Apply topically 2 times daily, Disp: , Rfl:     potassium chloride (KLOR-CON M) 20 MEQ extended release tablet, Take 2 tablets by mouth daily, Disp: , Rfl:  thiamine 100 MG tablet, Take 1 tablet by mouth daily, Disp: , Rfl:         Allergies   Allergen Reactions    Latex Rash    Acetaminophen-Codeine Nausea Only and Other (comments)     Nausea and pains in stomach    Codeine Other (comments)     Nausea and abd pain    Butrans [Buprenorphine] Rash    Iodine Rash    Lactose Diarrhea     Lactose intolerance    Other Plant, Educational psychologist, Environmental Hives     Conductive electrodes    Penicillins Itching       Past Medical History:   Diagnosis Date    Arthritis     knee, arms,     Asthma     resolved after gastric bypass    Chronic obstructive pulmonary disease (HCC)     no meds, since gastric bypass    Chronic pain     Diabetes (HCC)     no meds    H/O sinusitis     Hx SBO     multiple surgeries prior to 2002    Hypercholesterolemia     Hypertension     Sleep apnea     NOT USING     Stress incontinence      Thrombocytopenia (HCC)     Toe fracture, left August 2015    4th toe       Social History     Socioeconomic History    Marital status: SINGLE     Spouse name: Not on file    Number of children: Not on file    Years of education: Not on file    Highest education level: Not on file   Occupational History    Not on file   Tobacco Use    Smoking status: Never    Smokeless tobacco: Never   Vaping Use    Vaping Use: Never used   Substance and Sexual Activity    Alcohol use: No     Alcohol/week: 0.0 standard drinks    Drug use: No    Sexual activity: Not Currently     Partners: Male   Other Topics Concern    Not on file   Social History Narrative    Retired Psychologist, occupational, 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 Denies history of smoking      Social Determinants of Psychologist, prison and probation services Strain: Low Risk     Difficulty of Paying Living Expenses: Not very hard   Food Insecurity: No Food Insecurity    Worried About Programme researcher, broadcasting/film/video in the Last Year: Never true    Barista in the Last Year: Never true   Transportation Needs: Not on file   Physical Activity: Not on file   Stress: Not on file   Social Connections: Not on file   Intimate Partner Violence: Not on file   Housing Stability: Not on file       Family History   Problem Relation Age of Onset    Hypertension Mother     Stroke Mother     Diabetes Mother     Thyroid Disease Mother     Arthritis-rheumatoid Mother     Cancer Father         colon polpe    Cancer Maternal Aunt          OBJECTIVE    Physical Exam:     BP  132/88 (Site: Left Upper Arm, Position: Sitting, Cuff Size: Large Adult)   Pulse 73   Temp 97.8 F (36.6 C) (Temporal)   Resp 16   Ht 1.6 m (5\' 3" )   Wt 92.1 kg (203 lb)   SpO2 99%   BMI 35.96 kg/m         General: alert, well-appearing,obese,AA, in no apparent distress or pain  Head: Right brow with 2 sutures removed today   neck: supple, no adenopathy palpated  CVS: normal rate, regular rhythm, distinct S1 and S2  Lungs:clear to  ausculation bilaterally, no crackles, wheezing or rhonchi noted  Psych:  mood and affect normal  CMP:   Lab Results   Component Value Date/Time    NA 141 03/17/2023 08:04 AM    K 4.1 03/17/2023 08:04 AM    CL 110 03/17/2023 08:04 AM    CO2 29 03/17/2023 08:04 AM    BUN 12 03/17/2023 08:04 AM    CREATININE 0.70 03/17/2023 08:04 AM    GLUCOSE 113 03/17/2023 08:04 AM    GLUCOSE 107 12/10/2021 10:40 AM    CALCIUM 8.9 03/17/2023 08:04 AM    BILITOT 0.6 03/17/2023 08:04 AM    AST 18 03/17/2023 08:04 AM    ALT 23 03/17/2023 08:04 AM        CBC:   Lab Results   Component Value Date/Time    WBC 6.9 03/17/2023 08:04 AM    RBC 4.84 03/17/2023 08:04 AM    RBC 4.74 12/10/2021 10:40 AM    HGB 13.6 03/17/2023 08:04 AM    HCT 44.3 03/17/2023 08:04 AM    MCV 91.5 03/17/2023 08:04 AM    MCH 28.1 03/17/2023 08:04 AM    MCHC 30.7 03/17/2023 08:04 AM    RDW 13.6 03/17/2023 08:04 AM    PLT 158 03/17/2023 08:04 AM    MPV 12.6 03/17/2023 08:04 AM        Lipids   Lab Results   Component Value Date/Time    CHOL 152 09/16/2022 11:38 AM    TRIG 115 09/16/2022 11:38 AM    HDL 56 09/16/2022 11:38 AM    CHOLHDLRATIO 2.7 09/16/2022 11:38 AM         Imaging results last 24 hrs :MAM TOMO DIGITAL SCREEN BILATERAL    Result Date: 07/31/2021  BIRADS: 1-NEGATIVE BREAST DENSITY: C-The breasts are heterogeneously dense, which may obscure small masses. SCREENING MAMMOGRAM BILATERAL  3D tomosynthesis HISTORY: Screening. COMPARISON: 22 and other priors FINDINGS: 2D and 3D images were performed. Digital mammograms were performed. No suspicious microcalcifications, masses, or areas of architectural distortion. Interpretation performed in conjunction with computed assisted detection system.     No evidence of malignancy.  Suggest routine follow-up.      Imaging results impression onlyMAM TOMO DIGITAL SCREEN BILATERAL    Result Date: 07/31/2021  No evidence of malignancy.  Suggest routine follow-up.     No orders to display       A1c:   Hemoglobin A1C   Date Value  Ref Range Status   03/17/2023 5.7 (H) 4.2 - 5.6 % Final     Comment:     (NOTE)  HbA1C Interpretive Ranges  <5.7              Normal  5.7 - 6.4         Consider Prediabetes  >6.5              Consider Diabetes     12/18/2022 5.5 4.2 -  5.6 % Final     Comment:     (NOTE)  HbA1C Interpretive Ranges  <5.7              Normal  5.7 - 6.4         Consider Prediabetes  >6.5              Consider Diabetes     09/16/2022 5.7 (H) 4.2 - 5.6 % Final     Comment:     (NOTE)  HbA1C Interpretive Ranges  <5.7              Normal  5.7 - 6.4         Consider Prediabetes  >6.5              Consider Diabetes         ASSESSMENT/PLAN  Diagnoses and all orders for this visit:     Essential hypertension  controlled w/ hypoKalemia issues  cont aldactone to 50 mg and OTC kcl (had has chronic hypoK issues since gastric bypass)  DASH diet, BP Log    Hypokalemia  See #1    Diabetes mellitus type 2, diet-controlled (HCC)  Off meds after gastric bypass 02/2018  Monitoring   Labs prior to next visit  -     Comprehensive Metabolic Panel; Future  -     Hemoglobin A1C; Future  -     Lipid Panel; Future  -     Microalbumin / Creatinine Urine Ratio; Future    Mild intermittent asthma, unspecified whether complicated  Controlled  Cont prn albuterol      Pure hypercholesterolemia   off statin after wt loss/gastric bypass 2019  Monitoring    BMI 35-39 with comorbidity HCC  S/p gastric bypass    Vitamin D deficiency   s/p gastric bypass  Cont 50 k weekly  Monitoring    S/p gastric bypass  W/ dumping syndrome/symptoms of dehydration/hypoglycemia erratic  Advised increase hydration, fiber,  protein on each meal    Chronic pain of both knee  Following pain management dr. Sterling Big    Iron deficiency  Status post gastric bypass  For oral iron supplement  -     CBC with Auto Differential; Future  -     ferrous sulfate (IRON 325) 325 (65 Fe) MG tablet; Take 1 tablet by mouth daily (with breakfast)    Intestinal malabsorption, unspecified type  -     Vitamin B1, Whole  Blood; Future  -     Vitamin B12 & Folate; Future  -     Vitamin D 25 Hydroxy; Future  -     CBC with Auto Differential; Future  -     Ferritin; Future  -     Iron and TIBC; Future      Ff-up in 3 months, labs prior plan on MWV then    Patient understands plan of care. Patient has provided input and agrees with goals.

## 2023-03-29 MED ORDER — SPIRONOLACTONE 50 MG PO TABS
50 | ORAL_TABLET | Freq: Every day | ORAL | 1 refills | Status: AC
Start: 2023-03-29 — End: ?

## 2023-03-29 NOTE — Telephone Encounter (Signed)
Last OV 03/24/2023  Next OV 06/23/2022  Last lab 03/17/2023

## 2023-04-19 NOTE — Progress Notes (Unsigned)
BH MD/PA/NP OP Progress Note  04/20/2023 11:34 AM Brittany [name]  MRN:  [id number]  Visit Diagnosis:    ICD-10-CM   1. MDD (major depressive disorder), recurrent episode, mild (HCC)  F33.0       Assessment: Brittany [name] is a 72 y.o. female with a history of MDD who presented to [facility] at [facility] for initial evaluation on 02/11/22.  Patient had followed with Dr. Donell Beers in the past.   At initial evaluation patient reported experiencing increased mood lability with periods of depressed mood, tearfulness, decreased energy secondary to interpersonal stressors most often around her son.  Patient denied any SI/HI or thoughts of self-harm.  She denied any paranoia, delusions, AVH, or manic symptoms.  She also endorsed experiencing periods of increased anger out of proportion to triggers.  During which time she turns the anger internal and does not act out verbally or physically.  Patient felt that since decreasing Celexa 3 years ago she has been unstable.  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  It also showed mild global parenchymal volume loss and chronic white matter microangiopathy.   Brittany [name] presents for follow-up evaluation. Today, 04/20/23, patient reports stable mood and anxiety over the past month.  She has handled psychosocial stressors well without any significant change in anxiety or mood symptoms.  Patient has been dealing with some somatic issues of acid reflux and was encouraged to connect with her gastroenterologist.  The patient is stable she would like to try spacing appointments out to every other month.  She will still continue therapy with Gwen.  We will continue on her current medication regimen and follow-up in 2 months.  Plan:  - Continue Lexapro 10 mg QD - Continue trazodone 100 mg QHS  - Pregabalin 50 mg at bedtime for small fiber neuropathy,  managed by her neurologist - Labs reviewed, TSH WNL - Continue with [name] for therapy - Had went to a grief group, not a good fit. Thinking about a divorce group - Collateral obtained from Dr. Donell Beers - Follow up in 2 months  Chief Complaint:  Chief Complaint  Patient presents with   Follow-up   HPI: Brittany [name] presents reporting that things have been going pretty good.  He has kept busy with both work and things at home over the past month.  Patient reports that her son Dickie La has moved into the new house with his wife and daughter and they hosted Thanksgiving there.  Brittany [name] was also off school for the weekend stated Brittany [name] for part of the time which she enjoyed.  Psychiatrically patient reports that her anxiety and mood are both stable.  She is taking her medication consistently and denies any adverse side effects.  At this point her main concern is his severe acid reflux she is dealing with.  This resulted in her missing work 1 day.  She was encouraged to continue with her GI specialist to discuss this.  Past Psychiatric History: Patient reports first experiencing symptoms in 1998 with a final diagnosis coming in 1999.  She was voluntarily hospitalized in 2000 with severe depression.  She denies any active SI but does note passive SI with no intent or plan at that time.  Patient saw Dr. Janann August, Dr. Senaida Ores, and Dr. Donell Beers during which time she was prescribed Depakote, Celexa, nortriptyline, trazodone, and clorazepate.  She reports being stable  until 2020 when Celexa was decreased to 20 mg.  Cymbalta was started and then discontinued due to weight gain.  Started Lexapro on 04/13/2022  Patient denies any history of substance use.  Past Medical History:  Past Medical History:  Diagnosis Date   Allergic genetic state    Anemia    Anxiety    Arthritis    Cataract    Chicken pox    Clostridioides difficile infection 09/05/2018   Complication of anesthesia    Depression    Family history of  adverse reaction to anesthesia    mother - PONV   GERD (gastroesophageal reflux disease)    H/O hiatal hernia    History of GI bleed    Hypercholesteremia    Hyperlipidemia    Hypothyroidism    Osteoporosis    PONV (postoperative nausea and vomiting)    Stroke (HCC) 11/2017   Thyroid disease    Wears dentures    partial upper, full lower    Past Surgical History:  Procedure Laterality Date   ABDOMINAL HYSTERECTOMY  05/18/2000   total   BRAVO PH STUDY  05/26/2012   Procedure: BRAVO PH STUDY;  Surgeon: Shirley Friar, MD;  Location: [facility]COPY;  Service: Endoscopy;  Laterality: N/A;   CARPOMETACARPAL (CMC) FUSION OF THUMB Left 10/13/2022   Procedure: Left thumb carpometacarpal arthroplasty;  Surgeon: Kennedy Bucker, MD;  Location: [facility];  Service: Orthopedics;  Laterality: Left;   CATARACT EXTRACTION     CHOLECYSTECTOMY  05/18/2004   COLONOSCOPY WITH PROPOFOL N/A 10/24/2018   Procedure: COLONOSCOPY WITH PROPOFOL;  Surgeon: Scot Jun, MD;  Location: [facility] ENDOSCOPY;  Service: Endoscopy;  Laterality: N/A;   COLONOSCOPY WITH PROPOFOL N/A 08/14/2021   Procedure: COLONOSCOPY WITH PROPOFOL;  Surgeon: [name], DO;  Location: [facility] ENDOSCOPY;  Service: Gastroenterology;  Laterality: N/A;   detatched retina surgery     ESOPHAGOGASTRODUODENOSCOPY  05/26/2012   Procedure: ESOPHAGOGASTRODUODENOSCOPY (EGD);  Surgeon: Shirley Friar, MD;  Location: [facility];  Service: Endoscopy;  Laterality: N/A;   ESOPHAGOGASTRODUODENOSCOPY  05/30/2012   Procedure: ESOPHAGOGASTRODUODENOSCOPY (EGD);  Surgeon: Florencia Reasons, MD;  Location: [facility];  Service: Endoscopy;  Laterality: N/A;   ESOPHAGOGASTRODUODENOSCOPY N/A 08/31/2012   Procedure: ESOPHAGOGASTRODUODENOSCOPY (EGD);  Surgeon: Graylin Shiver, MD;  Location: [facility] END[facility]COPY;  Service: Endoscopy;  Laterality: N/A;   ESOPHAGOGASTRODUODENOSCOPY (EGD) WITH PROPOFOL N/A 10/24/2018   Procedure:  ESOPHAGOGASTRODUODENOSCOPY (EGD) WITH PROPOFOL;  Surgeon: Scot Jun, MD;  Location: [facility] ENDOSCOPY;  Service: Endoscopy;  Laterality: N/A;   ESOPHAGOGASTRODUODENOSCOPY (EGD) WITH PROPOFOL N/A 08/05/2020   Procedure: ESOPHAGOGASTRODUODENOSCOPY (EGD) WITH PROPOFOL;  Surgeon: Toledo, Boykin Nearing, MD;  Location: [facility] ENDOSCOPY;  Service: Gastroenterology;  Laterality: N/A;   ESOPHAGOGASTRODUODENOSCOPY (EGD) WITH PROPOFOL N/A 08/14/2021   Procedure: ESOPHAGOGASTRODUODENOSCOPY (EGD) WITH PROPOFOL;  Surgeon: [name], DO;  Location: [facility] ENDOSCOPY;  Service: Gastroenterology;  Laterality: N/A;   EYE SURGERY     FOOT SURGERY Right    HIP SURGERY  1970s   "hip sunk in"   left eye surgery for detached retina     LIVER BIOPSY  2007   NECK SURGERY  05/18/2010   PLANTAR FASCIA RELEASE Left 10/10/2020   Procedure: ENDOSCOPIC PLANTAR FASC. RELEASE;  Surgeon: Rosetta Posner, DPM;  Location: [facility];  Service: Podiatry;  Laterality: Left;  ANESTHESIA- CHOICE   ROTATOR CUFF REPAIR  03/18/2012   right side   THYROID SURGERY     nodule removed, right side   TMJ  ARTHROPLASTY  05/18/1990   TUBAL LIGATION  05/19/1979    Family Psychiatric History: Both of her kids see psychiatrists for ADHD  Family History:  Family History  Problem Relation Age of Onset   Hypertension Mother    Kidney disease Mother    Heart attack Mother    Osteoporosis Mother    Lung cancer Father    Cancer Sister    Leukemia Sister    Colon polyps Sister    Ovarian cancer Paternal Aunt    Ovarian cancer Maternal Grandmother     Social History:  Social History   Socioeconomic History   Marital status: Divorced    Spouse name: Not on file   Number of children: 2   Years of education: Not on file   Highest education level: Not on file  Occupational History   Not on file  Tobacco Use   Smoking status: Former    Current packs/day: 0.00    Average packs/day: 1.5 packs/day for 20.0 years (30.0  ttl pk-yrs)    Types: Cigarettes    Start date: 02/05/1988    Quit date: 02/05/2008    Years since quitting: 15.2   Smokeless tobacco: Never  Vaping Use   Vaping status: Never Used  Substance and Sexual Activity   Alcohol use: No   Drug use: No   Sexual activity: Not on file  Other Topics Concern   Not on file  Social History Narrative   Left Handed    Lives in a townhouse       Are you currently employed ?    What is your current occupation? retired   Do you live at home alone?yes   Who lives with you?    What type of home do you live in: 1 story or 2 story? two   Caffeine none    Social Determinants of Health   Financial Resource Strain: Patient Declined (03/11/2023)   Received from [facility]   Overall Financial Resource Strain (CARDIA)    Difficulty of Paying Living Expenses: Patient declined  Food Insecurity: Patient Declined (03/11/2023)   Received from [facility]   Hunger Vital Sign    Worried About Running Out of Food in the Last Year: Patient declined    Ran Out of Food in the Last Year: Patient declined  Transportation Needs: Patient Declined (03/11/2023)   Received from [facility] - Transportation    In the past 12 months, has lack of transportation kept you from medical appointments or from getting medications?: Patient declined    Lack of Transportation (Non-Medical): Patient declined  Physical Activity: Not on file  Stress: Not on file  Social Connections: Unknown (09/30/2021)   Received from [facility]   Social Network    Social Network: Not on file    Allergies:  Allergies  Allergen Reactions   Lithium Anaphylaxis   Morphine Sulfate Nausea And Vomiting   Fentanyl Other (See Comments)    Extreme Sedation  Other reaction(s): Lethargy (intolerance)    Morphine Nausea And Vomiting   Pseudoephedrine Hypertension        Pseudoephedrine Hcl Nausea Only and  Hypertension    Current Medications: Current Outpatient Medications  Medication Sig Dispense Refill   atorvastatin (LIPITOR) 40 MG tablet Take 40 mg by mouth daily.     betamethasone dipropionate 0.05 % cream Apply topically daily.     brimonidine (ALPHAGAN) 0.15 % ophthalmic solution SMARTSIG:In Eye(s)  Cholecalciferol 50 MCG (2000 UT) TABS Take 2,000 Units by mouth daily.     DORZOLAMIDE HCL OP Apply to eye 2 (two) times daily.     escitalopram (LEXAPRO) 10 MG tablet Take 1 tablet (10 mg total) by mouth daily. 90 tablet 0   levothyroxine (SYNTHROID, LEVOTHROID) 25 MCG tablet Take 25 mcg by mouth every morning.      montelukast (SINGULAIR) 10 MG tablet Take 10 mg by mouth at bedtime.     pantoprazole (PROTONIX) 40 MG tablet Take 40 mg by mouth daily.     pregabalin (LYRICA) 50 MG capsule Take 1 capsule (50 mg total) by mouth 2 (two) times daily. 60 capsule 5   timolol (TIMOPTIC) 0.5 % ophthalmic solution SMARTSIG:In Eye(s)     traZODone (DESYREL) 100 MG tablet Take 1 tablet (100 mg total) by mouth at bedtime. 90 tablet 1   vitamin B-12 (CYANOCOBALAMIN) 1000 MCG tablet Take 1,000 mcg by mouth daily.     No current facility-administered medications for this visit.     Musculoskeletal: Strength & Muscle Tone: within normal limits Gait & Station: normal Patient leans: N/A  Psychiatric Specialty Exam: Review of Systems  There were no vitals taken for this visit.There is no height or weight on file to calculate BMI.  General Appearance: Well Groomed  Eye Contact:  Good  Speech:  Clear and Coherent and Normal Rate  Volume:  Normal  Mood:  Euthymic  Affect:  Appropriate and Congruent  Thought Process:  Coherent and Goal Directed  Orientation:  Full (Time, Place, and Person)  Thought Content: Logical   Suicidal Thoughts:  No  Homicidal Thoughts:  No  Memory:  NA  Judgement:  Good  Insight:  Fair  Psychomotor Activity:  Normal  Concentration:  Concentration: Good  Recall:   Good  Fund of Knowledge: Good  Language: Good  Akathisia:  NA    AIMS (if indicated): not done  Assets:  Communication Skills Desire for Improvement Housing Leisure Time Resilience Transportation  ADL's:  Intact  Cognition: WNL  Sleep:  Good   Metabolic Disorder Labs: Lab Results  Component Value Date   HGBA1C 5.7 (H) 11/28/2017   MPG 116.89 11/28/2017   No results found for: "PROLACTIN" Lab Results  Component Value Date   CHOL 180 11/28/2017   TRIG 128 11/28/2017   HDL 31 (L) 11/28/2017   CHOLHDL 5.8 11/28/2017   VLDL 26 11/28/2017   LDLCALC 123 (H) 11/28/2017   Lab Results  Component Value Date   TSH 1.180 08/19/2021   TSH 4.830 (H) 08/31/2012    Therapeutic Level Labs: No results found for: "LITHIUM" No results found for: "VALPROATE" No results found for: "CBMZ"   Screenings: PHQ2-9    Flowsheet Row [facility] from [facility]11/20[facility] in [facility]  PHQ-2 Total Score 1      Flowsheet Row Admission (Discharged) from 10/13/2022 in  [facility] PERIOP Admission (Discharged) from 08/14/2021 in [facility] ED from 05/30/2021 in [facility]  C-SSRS RISK CATEGORY No Risk No Risk No Risk       Collaboration of Care: Collaboration of Care: Medication Management AEB medication prescription  40 minutes were spent in chart review, interview, psycho education, counseling, medical decision making, coordination of care and long-term prognosis.  Patient was given opportunity to ask question and all concerns and questions were addressed and answers. Excluding separately billable services.  Patient/Guardian was advised Release of  Information must be obtained prior to any record release in order to collaborate their care with an outside provider. Patient/Guardian was advised if they have not already done so to contact the registration department to  sign all necessary forms in order for [place] to release information regarding their care.   Consent: Patient/Guardian gives verbal consent for treatment and assignment of benefits for services provided during this visit. Patient/Guardian expressed understanding and agreed to proceed.    Stasia Cavalier, MD 04/20/2023, 11:34 AM

## 2023-04-20 ENCOUNTER — Encounter (HOSPITAL_COMMUNITY): Payer: Self-pay | Admitting: Psychiatry

## 2023-04-20 ENCOUNTER — Ambulatory Visit (HOSPITAL_COMMUNITY): Payer: Medicare Other | Admitting: Psychiatry

## 2023-04-20 VITALS — BP 105/65 | HR 60 | Ht 65.0 in | Wt 150.0 lb

## 2023-04-20 DIAGNOSIS — F330 Major depressive disorder, recurrent, mild: Secondary | ICD-10-CM | POA: Diagnosis not present

## 2023-05-11 ENCOUNTER — Other Ambulatory Visit: Payer: Self-pay | Admitting: Gastroenterology

## 2023-05-11 DIAGNOSIS — K219 Gastro-esophageal reflux disease without esophagitis: Secondary | ICD-10-CM

## 2023-05-21 ENCOUNTER — Encounter
Admission: RE | Admit: 2023-05-21 | Discharge: 2023-05-21 | Disposition: A | Discharge status: Home / Self Care | Payer: Medicare Other | Source: Ambulatory Visit | Attending: Gastroenterology

## 2023-05-21 DIAGNOSIS — K219 Gastro-esophageal reflux disease without esophagitis: Secondary | ICD-10-CM | POA: Insufficient documentation

## 2023-05-21 MED ORDER — TECHNETIUM TC 99M SULFUR COLLOID: 2.0600 | Freq: Once | INTRAVENOUS | Status: DC | PRN

## 2023-05-21 MED ORDER — TECHNETIUM TC 99M SULFUR COLLOID
2.0700 | Freq: Once | INTRAVENOUS | Status: AC | PRN
  Administered 2023-05-21: 2.07 via ORAL

## 2023-05-21 NOTE — Telephone Encounter (Signed)
 Pt states that she needs a new pain management. She doesn't like the one she is seeing now that took the place of Dr Sterling Big since she left. Please advise.

## 2023-05-25 MED ORDER — SPIRONOLACTONE 50 MG PO TABS
50 | ORAL_TABLET | Freq: Every day | ORAL | 1 refills | Status: AC
Start: 2023-05-25 — End: ?

## 2023-05-25 MED ORDER — NYSTATIN 100000 UNIT/GM EX CREA
100000 | Freq: Two times a day (BID) | CUTANEOUS | 1 refills | Status: AC
Start: 2023-05-25 — End: ?

## 2023-05-25 MED ORDER — VITAMIN D (ERGOCALCIFEROL) 1.25 MG (50000 UT) PO CAPS
1.25 MG (50000 UT) | ORAL_CAPSULE | ORAL | 1 refills | Status: DC
Start: 2023-05-25 — End: 2023-06-04

## 2023-05-25 NOTE — Telephone Encounter (Signed)
 Patient now has a new insurance and needs to switch her [employer] to [employer].  Last OV 03/24/2023  Next OV 06/24/2023

## 2023-05-27 NOTE — Telephone Encounter (Signed)
Called pt lmov to check for ins covering pain management and to call back with info. Closing encounter

## 2023-06-04 ENCOUNTER — Encounter

## 2023-06-04 NOTE — Telephone Encounter (Addendum)
[facility]lon is requesting a prior auth for Vit D . She also needs her   ferrous sulfate (IRON 325) 325 (65 Fe) MG tablet  QTY 90 refill at [facility] . Please advise. Thank you

## 2023-06-04 NOTE — Telephone Encounter (Signed)
Last OV 03/24/2023  Next OV 06/24/2023

## 2023-06-07 MED ORDER — FERROUS SULFATE 325 (65 FE) MG PO TABS
32565 (65 Fe) MG | ORAL_TABLET | Freq: Every day | ORAL | 1 refills | Status: DC
Start: 2023-06-07 — End: 2023-06-24

## 2023-06-07 MED ORDER — VITAMIN D (ERGOCALCIFEROL) 1.25 MG (50000 UT) PO CAPS
1.2550000 MG (50000 UT) | ORAL_CAPSULE | ORAL | 1 refills | Status: DC
Start: 2023-06-07 — End: 2023-06-24

## 2023-06-17 ENCOUNTER — Inpatient Hospital Stay: Admit: 2023-06-17 | Payer: MEDICARE | Primary: Family Medicine

## 2023-06-17 ENCOUNTER — Other Ambulatory Visit: Payer: MEDICARE | Primary: Family Medicine

## 2023-06-17 LAB — LABCORP SPECIMEN COLLECTION

## 2023-06-18 LAB — LIPID PANEL
Cholesterol, Total: 177 mg/dL (ref 100–199)
HDL: 69 mg/dL (ref >39)
LDL Cholesterol: 92 mg/dL (ref 0–99)
Triglycerides: 88 mg/dL (ref 0–149)
VLDL Cholesterol Calculated: 16 mg/dL (ref 5–40)

## 2023-06-18 LAB — CBC/DIFF AMBIGUOUS DEFAULT
Basophils %: 1 %
Basophils Absolute: 0.1 10*3/uL (ref 0.0–0.2)
Eosinophils %: 2 %
Eosinophils Absolute: 0.2 10*3/uL (ref 0.0–0.4)
Hematocrit: 44.1 % (ref 34.0–46.6)
Hemoglobin: 13.8 g/dL (ref 11.1–15.9)
Immature Grans (Abs): 0 10*3/uL (ref 0.0–0.1)
Immature Granulocytes %: 1 %
Lymphocytes %: 29 %
Lymphocytes Absolute: 1.9 10*3/uL (ref 0.7–3.1)
MCH: 28.1 pg (ref 26.6–33.0)
MCHC: 31.3 g/dL — ABNORMAL LOW (ref 31.5–35.7)
MCV: 90 fL (ref 79–97)
Monocytes %: 5 %
Monocytes Absolute: 0.3 10*3/uL (ref 0.1–0.9)
Neutrophils %: 62 %
Neutrophils Absolute: 4.2 10*3/uL (ref 1.4–7.0)
Platelets: 163 10*3/uL (ref 150–450)
RBC: 4.91 x10E6/uL (ref 3.77–5.28)
RDW: 13 % (ref 11.7–15.4)
WBC: 6.7 10*3/uL (ref 3.4–10.8)

## 2023-06-18 LAB — COMPREHENSIVE METABOLIC PANEL
ALT: 21 [IU]/L (ref 0–32)
AST: 16 [IU]/L (ref 0–40)
Albumin: 4.6 g/dL (ref 3.8–4.8)
Alkaline Phosphatase: 84 [IU]/L (ref 44–121)
BUN/Creatinine Ratio: 19 (ref 12–28)
BUN: 12 mg/dL (ref 8–27)
CO2: 23 mmol/L (ref 20–29)
Calcium: 9.4 mg/dL (ref 8.7–10.3)
Chloride: 102 mmol/L (ref 96–106)
Creatinine: 0.63 mg/dL (ref 0.57–1.00)
Est, Glom Filt Rate: 95 mL/min/{1.73_m2} (ref >59)
Globulin, Total: 2.2 g/dL (ref 1.5–4.5)
Glucose: 109 mg/dL — ABNORMAL HIGH (ref 70–99)
Potassium: 4.6 mmol/L (ref 3.5–5.2)
Sodium: 142 mmol/L (ref 134–144)
Total Bilirubin: 0.5 mg/dL (ref 0.0–1.2)
Total Protein: 6.8 g/dL (ref 6.0–8.5)

## 2023-06-18 LAB — HEMOGLOBIN A1C: Hemoglobin A1C: 6.1 % — ABNORMAL HIGH (ref 4.8–5.6)

## 2023-06-18 LAB — VITAMIN B12 & FOLATE
Folate: 14.1 ng/mL (ref >3.0)
Vitamin B-12: >2000 pg/mL — ABNORMAL HIGH (ref 232–1245)

## 2023-06-18 LAB — IRON AND TIBC
Iron % Saturation: 18 % (ref 15–55)
Iron: 72 ug/dL (ref 27–139)
TIBC: 390 ug/dL (ref 250–450)
UIBC: 318 ug/dL (ref 118–369)

## 2023-06-18 LAB — VITAMIN D 25 HYDROXY: Vit D, 25-Hydroxy: 32 ng/mL (ref 30.0–100.0)

## 2023-06-18 LAB — SPECIMEN STATUS REPORT

## 2023-06-18 LAB — FERRITIN: Ferritin: 70 ng/mL (ref 15–150)

## 2023-06-21 LAB — ALBUMIN/CREATININE RATIO, URINE
Albumin Urine: 4.2 ug/mL
Albumin/Creatinine Ratio: 6 mg/g{creat} (ref 0–29)
Creatinine, Ur: 70.5 mg/dL

## 2023-06-24 ENCOUNTER — Ambulatory Visit: Admit: 2023-06-24 | Discharge: 2023-06-24 | Payer: MEDICARE | Attending: Family Medicine | Primary: Family Medicine

## 2023-06-24 VITALS — BP 138/82 | HR 82 | Temp 96.20000°F | Resp 16 | Ht 63.0 in | Wt 204.6 lb

## 2023-06-24 DIAGNOSIS — K9089 Other intestinal malabsorption: Secondary | ICD-10-CM

## 2023-06-24 MED ORDER — CALCIUM CITRATE 950 (200 CA) MG PO TABS
950200 | ORAL_TABLET | Freq: Every day | ORAL | 3 refills | Status: AC
Start: 2023-06-24 — End: ?

## 2023-06-24 MED ORDER — POTASSIUM CHLORIDE CRYS ER 20 MEQ PO TBCR
20 | ORAL_TABLET | Freq: Every day | ORAL | 1 refills | 65.00000 days | Status: DC
Start: 2023-06-24 — End: 2024-02-15

## 2023-06-24 MED ORDER — VITAMIN D (ERGOCALCIFEROL) 1.25 MG (50000 UT) PO CAPS
1.2550000 | ORAL_CAPSULE | ORAL | 3 refills | Status: AC
Start: 2023-06-24 — End: ?

## 2023-06-24 MED ORDER — FERROUS SULFATE 325 (65 FE) MG PO TABS
32565 | ORAL_TABLET | Freq: Every day | ORAL | 3 refills | Status: AC
Start: 2023-06-24 — End: ?

## 2023-06-24 NOTE — Progress Notes (Signed)
SUBJECTIVE  Ff-up    HTN- currently on aldactone   Hypokalemia- endo eval neg for hyperaldo, currently on aldactone with  OTC kcl, normal K levels.     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  Reviewed malabsorption , on MVT replacement    DM-diet controlled, she does not check glucose at home. She is no longer on metformin.       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 Says weight loss improved respirotary symptoms. denies cough, sob, wheezing. She says last albuterol use was a year ago. Usually heat triggers respiratory symptoms.    Chronic OA knee pain- following [facility] pain management, new provider deescalating to tramadol    ROS:  See HPI, all others negative        Patient Active Problem List   Diagnosis Code    Chronic infection of sinus J32.9    Type 2 diabetes mellitus without complication (HCC) E11.9    Incontinence in female R32    Candidal intertrigo B37.2    Arthritis, multiple joint involvement M12.9    Essential hypertension I10    COPD (chronic obstructive pulmonary disease) (HCC) J44.9    Hyperlipidemia E78.5    Vitamin D deficiency E55.9    Internal hemorrhoids K64.8    Colon polyps K63.5    Hypokalemia E87.6    Hypomagnesemia E83.42    Syncope R55    Elevated troponin R77.8    Short gut syndrome K91.2    Hypocalcemia E83.51    Mild intermittent asthma J45.20    Thrombocytopenia, unspecified D69.6    Arthritis of knee M17.10    Non-compliance Z91.199    Arthrofibrosis of knee joint, 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         Current Outpatient Medications:     traMADol (ULTRAM) 50 MG tablet, , Disp: , Rfl:     ferrous sulfate (IRON 325) 325 (65 Fe) MG tablet, Take 1 tablet by mouth daily (with breakfast), Disp: 90 tablet, Rfl: 1    vitamin D (ERGOCALCIFEROL) 1.25 MG (50000 UT) CAPS capsule, Take 1 capsule by mouth every 7 days, Disp: 12 capsule, Rfl: 1    spironolactone (ALDACTONE) 50 MG  tablet, Take 1 tablet by mouth daily, Disp: 90 tablet, Rfl: 1    nystatin (MYCOSTATIN) 100000 UNIT/GM cream, Apply topically 2 times daily, Disp: 30 g, Rfl: 1    oxyCODONE HCl (OXY-IR) 10 MG immediate release tablet, , Disp: , Rfl:     diclofenac sodium (VOLTAREN) 1 % GEL, , Disp: , Rfl:     blood glucose test strips (TRUE METRIX BLOOD GLUCOSE TEST) strip, Check glucose level once daily, Disp: 100 strip, Rfl: 3    TRUEplus Lancets 33G MISC, Check glucose level once daily, Disp: 100 each, Rfl: 3    Blood Glucose Monitoring Suppl (TRUE METRIX AIR GLUCOSE METER) w/Device KIT, Check glucose level once daily, Disp: 1 kit, Rfl: 0    aspirin 81 MG EC tablet, Take 1 tablet by mouth daily, Disp: , Rfl:     calcium citrate (CALCITRATE) 950 (200 Ca) MG tablet, Take 1 tablet by mouth daily, Disp: , Rfl:     cyanocobalamin 1000 MCG tablet, Take 1 tablet by mouth daily, Disp: , Rfl:     magnesium oxide (MAG-OX) 400 MG tablet, Take 1 tablet by mouth daily, Disp: , Rfl:     potassium chloride (KLOR-CON M) 20 MEQ extended release tablet, Take 2 tablets by mouth  daily, Disp: , Rfl:     thiamine 100 MG tablet, Take 1 tablet by mouth daily, Disp: , Rfl:         Allergies   Allergen Reactions    Latex Rash    Acetaminophen-Codeine Nausea Only and Other (comments)     Nausea and pains in stomach    Codeine Other (comments)     Nausea and abd pain    Butrans [Buprenorphine] Rash    Iodine Rash    Lactose Diarrhea     Lactose intolerance    Other Plant, Educational psychologist, Environmental Hives     Conductive electrodes    Penicillins Itching       Past Medical History:   Diagnosis Date    Arthritis     knee, arms,     Asthma     resolved after gastric bypass    Chronic obstructive pulmonary disease (HCC)     no meds, since gastric bypass    Chronic pain     Diabetes (HCC)     no meds    H/O sinusitis     Hx SBO     multiple surgeries prior to 2002    Hypercholesterolemia     Hypertension     Sleep apnea     NOT USING     Stress incontinence      Thrombocytopenia (HCC)     Toe fracture, left August 2015    4th toe       Social History     Socioeconomic History    Marital status: SINGLE     Spouse name: Not on file    Number of children: Not on file    Years of education: Not on file    Highest education level: Not on file   Occupational History    Not on file   Tobacco Use    Smoking status: Never    Smokeless tobacco: Never   Vaping Use    Vaping Use: Never used   Substance and Sexual Activity    Alcohol use: No     Alcohol/week: 0.0 standard drinks    Drug use: No    Sexual activity: Not Currently     Partners: Male   Other Topics Concern    Not on file   Social History Narrative    Retired Psychologist, occupational, 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 Denies history of smoking      Social Determinants of Psychologist, prison and probation services Strain: Low Risk     Difficulty of Paying Living Expenses: Not very hard   Food Insecurity: No Food Insecurity    Worried About Programme researcher, broadcasting/film/video in the Last Year: Never true    Barista in the Last Year: Never true   Transportation Needs: Not on file   Physical Activity: Not on file   Stress: Not on file   Social Connections: Not on file   Intimate Partner Violence: Not on file   Housing Stability: Not on file       Family History   Problem Relation Age of Onset    Hypertension Mother     Stroke Mother     Diabetes Mother     Thyroid Disease Mother     Arthritis-rheumatoid Mother     Cancer Father         colon polpe    Cancer Maternal Aunt          OBJECTIVE  Physical Exam:     BP 138/82   Pulse 82   Temp (!) 96.2 F (35.7 C) (Temporal)   Resp 16   Ht 1.6 m (5\' 3" )   Wt 92.8 kg (204 lb 9.6 oz)   SpO2 98%   BMI 36.24 kg/m         General: alert, well-appearing,obese,AA, in no apparent distress or pain  Head: Right brow with 2 sutures removed today   neck: supple, no adenopathy palpated  CVS: normal rate, regular rhythm, distinct S1 and S2  Lungs:clear to ausculation bilaterally, no crackles, wheezing or  rhonchi noted  Psych:  mood and affect normal  CMP:   Lab Results   Component Value Date/Time    NA 142 06/17/2023 12:00 AM    K 4.6 06/17/2023 12:00 AM    CL 102 06/17/2023 12:00 AM    CO2 23 06/17/2023 12:00 AM    BUN 12 06/17/2023 12:00 AM    CREATININE 0.63 06/17/2023 12:00 AM    GLUCOSE 109 06/17/2023 12:00 AM    CALCIUM 9.4 06/17/2023 12:00 AM    BILITOT 0.5 06/17/2023 12:00 AM    AST 16 06/17/2023 12:00 AM    ALT 21 06/17/2023 12:00 AM        CBC:   Lab Results   Component Value Date/Time    WBC 6.7 06/17/2023 12:00 AM    RBC 4.91 06/17/2023 12:00 AM    RBC 4.74 12/10/2021 10:40 AM    HGB 13.8 06/17/2023 12:00 AM    HCT 44.1 06/17/2023 12:00 AM    MCV 90 06/17/2023 12:00 AM    MCH 28.1 06/17/2023 12:00 AM    MCHC 31.3 06/17/2023 12:00 AM    RDW 13.0 06/17/2023 12:00 AM    PLT 163 06/17/2023 12:00 AM    MPV 12.6 03/17/2023 08:04 AM        Lipids   Lab Results   Component Value Date/Time    CHOL 177 06/17/2023 12:00 AM    TRIG 88 06/17/2023 12:00 AM    HDL 69 06/17/2023 12:00 AM    CHOLHDLRATIO 2.7 09/16/2022 11:38 AM         Imaging results last 24 hrs :MAM TOMO DIGITAL SCREEN BILATERAL    Result Date: 07/31/2021  BIRADS: 1-NEGATIVE BREAST DENSITY: C-The breasts are heterogeneously dense, which may obscure small masses. SCREENING MAMMOGRAM BILATERAL  3D tomosynthesis HISTORY: Screening. COMPARISON: 22 and other priors FINDINGS: 2D and 3D images were performed. Digital mammograms were performed. No suspicious microcalcifications, masses, or areas of architectural distortion. Interpretation performed in conjunction with computed assisted detection system.     No evidence of malignancy.  Suggest routine follow-up.      Imaging results impression onlyMAM TOMO DIGITAL SCREEN BILATERAL    Result Date: 07/31/2021  No evidence of malignancy.  Suggest routine follow-up.     No orders to display       A1c:   Hemoglobin A1C   Date Value Ref Range Status   06/17/2023 6.1 (H) 4.8 - 5.6 % Final     Comment:                  Prediabetes: 5.7 - 6.4           Diabetes: >6.4           Glycemic control for adults with diabetes: <7.0     03/17/2023 5.7 (H) 4.2 - 5.6 % Final     Comment:     (NOTE)  HbA1C Interpretive Ranges  <5.7              Normal  5.7 - 6.4         Consider Prediabetes  >6.5              Consider Diabetes     12/18/2022 5.5 4.2 - 5.6 % Final     Comment:     (NOTE)  HbA1C Interpretive Ranges  <5.7              Normal  5.7 - 6.4         Consider Prediabetes  >6.5              Consider Diabetes         ASSESSMENT/PLAN  Diagnoses and all orders for this visit:     Essential hypertension  controlled w/ hypoKalemia issues  cont aldactone to 50 mg and OTC kcl (had has chronic hypoK issues since gastric bypass)  DASH diet, BP Log    Hypokalemia  See #1    Diabetes mellitus type 2, diet-controlled (HCC)  Off meds after gastric bypass 02/2018  Monitoring   Labs prior to next visit  -     Comprehensive Metabolic Panel; Future  -     Hemoglobin A1C; Future    Mild intermittent asthma, unspecified whether complicated  Controlled  Cont prn albuterol      Pure hypercholesterolemia   off statin after wt loss/gastric bypass 2019  Monitoring    BMI 35-39 with comorbidity HCC  S/p gastric bypass    Vitamin D deficiency   s/p gastric bypass  Cont 50 k weekly  Monitoring    S/p gastric bypass  W/ dumping syndrome/symptoms of dehydration/hypoglycemia erratic  Advised increase hydration, fiber,  protein on each meal    Chronic pain of both knee  Following pain management dr. Sterling Big    Iron deficiency  Status post gastric bypass  For oral iron supplement  -     ferrous sulfate (IRON 325) 325 (65 Fe) MG tablet; Take 1 tablet by mouth daily (with breakfast)    Intestinal malabsorption, unspecified type      Ff-up in 6 months, labs prior plan on MWV then    Patient understands plan of care. Patient has provided input and agrees with goals.

## 2023-06-24 NOTE — Progress Notes (Signed)
"  Have you been to the ER, urgent care clinic since your last visit?  Hospitalized since your last visit?"    NO    "Have you seen or consulted any other health care providers outside our system since your last visit?"    NO      "Have you had a diabetic eye exam?"    NO     Date of last diabetic eye exam: 02/16/2022

## 2023-06-28 NOTE — Progress Notes (Unsigned)
 BH MD/PA/NP OP Progress Note  06/29/2023 11:56 AM Brittany [name]  MRN:  [id number]  Visit Diagnosis:    ICD-10-CM   1. MDD (major depressive disorder), recurrent episode, mild (HCC)  F33.0 escitalopram (LEXAPRO) 10 MG tablet     Assessment: Brittany [name] is a 73 y.o. female with a history of MDD who presented to [facility] for initial evaluation on 02/11/22.  Patient had followed with Dr. Donell Beers in the past.   At initial evaluation patient reported experiencing increased mood lability with periods of depressed mood, tearfulness, decreased energy secondary to interpersonal stressors most often around her son.  Patient denied any SI/HI or thoughts of self-harm.  She denied any paranoia, delusions, AVH, or manic symptoms.  She also endorsed experiencing periods of increased anger out of proportion to triggers.  During which time she turns the anger internal and does not act out verbally or physically.  Patient felt that since decreasing Celexa 3 years ago she has been unstable.  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  It also showed mild global parenchymal volume loss and chronic white matter microangiopathy.   Brittany [name] presents for follow-up evaluation. Today, 06/29/23, patient reports mood is overall stable.  There is some interpersonal stressors between her and her kids though she has been able to manage this and is working to set boundaries.  Outside of that she denies any significant psychosocial stressors.  The acid reflux reported last time remains a concern and she has an upcoming endoscopy. She will continue monthly therapy with Gwen.  We will continue on her current medication regimen and follow-up in 2 months.  Psychotherapeutic interventions were used during today's session. From 11:32 AM to 11:50 AM we used empathic listening techniques and provided  support. Used supportive interviewing techniques to validate patients feelings. Worked on cognitive re framing techniques and focusing on behavioral activation.  Improvement was evidenced by patient's participation.    Plan:  - Continue Lexapro 10 mg QD - Continue trazodone 100 mg QHS  - Pregabalin 50 mg at bedtime for small fiber neuropathy, managed by her neurologist - Labs reviewed, TSH WNL - Continue with [name] for therapy once a month - Had went to a grief group, not a good fit. Thinking about a divorce group - Collateral obtained from Dr. Donell Beers - Follow up in 2 months  Chief Complaint:  Chief Complaint  Patient presents with   Follow-up   HPI: Brittany [name] presents reporting that mood wise things have been fairly stable in the interim.  She is still dealing with the acid reflux episodes once a week. It can take a few days to recover after one of the flair ups.  Brittany [name] has an endoscopy scheduled for later this week.   In regards to work Brittany [name] reports that it is still going well. 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 Brittany [name] did well to set boundaries so that she would only work one weekend a month during that time. The employee returned this week. Overall Brittany [name] feels that it went well and is enjoying her time at work.  Patient expresses frustration with her kids and has had some feeling that her kids have been distancing themselves from her. She only seems to hear from Brittany [name] when he wants something. There was an recent incident where Brittany [name] sent  Brittany [name] some money so he had collateral for a loan. He however returned 5000 less then he borrowed. Reviewed setting boundaries with her kids especially in regards to finances which patient does plan to do.  Has not heard from Brittany [name] much recently. She has made plans to visit [place] with her son in November of this year.   Past Psychiatric History: Patient reports first experiencing symptoms in 1998 with a final diagnosis  coming in 1999.  She was voluntarily hospitalized in 2000 with severe depression.  She denies any active SI but does note passive SI with no intent or plan at that time.  Patient saw Dr. Janann August, Dr. Senaida Ores, and Dr. Donell Beers during which time she was prescribed Depakote, Celexa, nortriptyline, trazodone, and clorazepate.  She reports being stable until 2020 when Celexa was decreased to 20 mg.  Cymbalta was started and then discontinued due to weight gain.  Started Lexapro on 04/13/2022  Patient denies any history of substance use.  Past Medical History:  Past Medical History:  Diagnosis Date   Allergic genetic state    Anemia    Anxiety    Arthritis    Cataract    Chicken pox    Clostridioides difficile infection 09/05/2018   Complication of anesthesia    Depression    Family history of adverse reaction to anesthesia    mother - PONV   GERD (gastroesophageal reflux disease)    H/O hiatal hernia    History of GI bleed    Hypercholesteremia    Hyperlipidemia    Hypothyroidism    Osteoporosis    PONV (postoperative nausea and vomiting)    Stroke (HCC) 11/2017   Thyroid disease    Wears dentures    partial upper, full lower    Past Surgical History:  Procedure Laterality Date   ABDOMINAL HYSTERECTOMY  05/18/2000   total   BRAVO PH STUDY  05/26/2012   Procedure: BRAVO PH STUDY;  Surgeon: Shirley Friar, MD;  Location: [facility];  Service: Endoscopy;  Laterality: N/A;   CARPOMETACARPAL (CMC) FUSION OF THUMB Left 10/13/2022   Procedure: Left thumb carpometacarpal arthroplasty;  Surgeon: Kennedy Bucker, MD;  Location: [facility];  Service: Orthopedics;  Laterality: Left;   CATARACT EXTRACTION     CHOLECYSTECTOMY  05/18/2004   COLONOSCOPY WITH PROPOFOL N/A 10/24/2018   Procedure: COLONOSCOPY WITH PROPOFOL;  Surgeon: Scot Jun, MD;  Location: [facility] ENDOSCOPY;  Service: Endoscopy;  Laterality: N/A;   COLONOSCOPY WITH PROPOFOL N/A 08/14/2021   Procedure:  COLONOSCOPY WITH PROPOFOL;  Surgeon: [name], DO;  Location: [facility] ENDOSCOPY;  Service: Gastroenterology;  Laterality: N/A;   detatched retina surgery     ESOPHAGOGASTRODUODENOSCOPY  05/26/2012   Procedure: ESOPHAGOGASTRODUODENOSCOPY (EGD);  Surgeon: Shirley Friar, MD;  Location: [facility];  Service: Endoscopy;  Laterality: N/A;   ESOPHAGOGASTRODUODENOSCOPY  05/30/2012   Procedure: ESOPHAGOGASTRODUODENOSCOPY (EGD);  Surgeon: Florencia Reasons, MD;  Location: [facility];  Service: Endoscopy;  Laterality: N/A;   ESOPHAGOGASTRODUODENOSCOPY N/A 08/31/2012   Procedure: ESOPHAGOGASTRODUODENOSCOPY (EGD);  Surgeon: Graylin Shiver, MD;  Location: [facility] ENDOSCOPY;  Service: Endoscopy;  Laterality: N/A;   ESOPHAGOGASTRODUODENOSCOPY (EGD) WITH PROPOFOL N/A 10/24/2018   Procedure: ESOPHAGOGASTRODUODENOSCOPY (EGD) WITH PROPOFOL;  Surgeon: Scot Jun, MD;  Location: [name] ENDOSCOPY;  Service: Endoscopy;  Laterality: N/A;   ESOPHAGOGASTRODUODENOSCOPY (EGD) WITH PROPOFOL N/A 08/05/2020   Procedure: ESOPHAGOGASTRODUODENOSCOPY (EGD) WITH PROPOFOL;  Surgeon: Toledo, Boykin Nearing, MD;  Location: [facility] ENDOSCOPY;  Service: Gastroenterology;  Laterality: N/A;  ESOPHAGOGASTRODUODENOSCOPY (EGD) WITH PROPOFOL N/A 08/14/2021   Procedure: ESOPHAGOGASTRODUODENOSCOPY (EGD) WITH PROPOFOL;  Surgeon: [name], DO;  Location: [facility] ENDOSCOPY;  Service: Gastroenterology;  Laterality: N/A;   EYE SURGERY     FOOT SURGERY Right    HIP SURGERY  1970s   "hip sunk in"   left eye surgery for detached retina     LIVER BIOPSY  2007   NECK SURGERY  05/18/2010   PLANTAR FASCIA RELEASE Left 10/10/2020   Procedure: ENDOSCOPIC PLANTAR FASC. RELEASE;  Surgeon: Rosetta Posner, DPM;  Location: [facility];  Service: Podiatry;  Laterality: Left;  ANESTHESIA- CHOICE   ROTATOR CUFF REPAIR  03/18/2012   right side   THYROID SURGERY     nodule removed, right side   TMJ ARTHROPLASTY  05/18/1990   TUBAL  LIGATION  05/19/1979    Family Psychiatric History: Both of her kids see psychiatrists for ADHD  Family History:  Family History  Problem Relation Age of Onset   Hypertension Mother    Kidney disease Mother    Heart attack Mother    Osteoporosis Mother    Lung cancer Father    Cancer Sister    Leukemia Sister    Colon polyps Sister    Ovarian cancer Paternal Aunt    Ovarian cancer Maternal Grandmother     Social History:  Social History   Socioeconomic History   Marital status: Divorced    Spouse name: Not on file   Number of children: 2   Years of education: Not on file   Highest education level: Not on file  Occupational History   Not on file  Tobacco Use   Smoking status: Former    Current packs/day: 0.00    Average packs/day: 1.5 packs/day for 20.0 years (30.0 ttl pk-yrs)    Types: Cigarettes    Start date: 02/05/1988    Quit date: 02/05/2008    Years since quitting: 15.4   Smokeless tobacco: Never  Vaping Use   Vaping status: Never Used  Substance and Sexual Activity   Alcohol use: No   Drug use: No   Sexual activity: Not on file  Other Topics Concern   Not on file  Social History Narrative   Left Handed    Lives in a townhouse       Are you currently employed ?    What is your current occupation? retired   Do you live at home alone?yes   Who lives with you?    What type of home do you live in: 1 story or 2 story? two   Caffeine none    Social Drivers of Corporate investment banker Strain: Patient Declined (03/11/2023)   Received from [facility]   Overall Financial Resource Strain (CARDIA)    Difficulty of Paying Living Expenses: Patient declined  Food Insecurity: Patient Declined (03/11/2023)   Received from [facility]   Hunger Vital Sign    Worried About Running Out of Food in the Last Year: Patient declined    Ran Out of Food in the Last Year: Patient declined  Transportation Needs: Patient Declined  (03/11/2023)   Received from [facility] - Transportation    In the past 12 months, has lack of transportation kept you from medical appointments or from getting medications?: Patient declined    Lack of Transportation (Non-Medical): Patient declined  Physical Activity: Not on file  Stress: Not on file  Social  Connections: Unknown (09/30/2021)   Received from [facility]   Social Network    Social Network: Not on file    Allergies:  Allergies  Allergen Reactions   Lithium Anaphylaxis   Morphine Sulfate Nausea And Vomiting   Fentanyl Other (See Comments)    Extreme Sedation  Other reaction(s): Lethargy (intolerance)    Morphine Nausea And Vomiting   Pseudoephedrine Hypertension        Pseudoephedrine Hcl Nausea Only and Hypertension    Current Medications: Current Outpatient Medications  Medication Sig Dispense Refill   atorvastatin (LIPITOR) 40 MG tablet Take 40 mg by mouth daily.     betamethasone dipropionate 0.05 % cream Apply topically daily.     brimonidine (ALPHAGAN) 0.15 % ophthalmic solution SMARTSIG:In Eye(s)     Cholecalciferol 50 MCG (2000 UT) TABS Take 2,000 Units by mouth daily.     DORZOLAMIDE HCL OP Apply to eye 2 (two) times daily.     escitalopram (LEXAPRO) 10 MG tablet Take 1 tablet (10 mg total) by mouth daily. 90 tablet 0   levothyroxine (SYNTHROID, LEVOTHROID) 25 MCG tablet Take 25 mcg by mouth every morning.      montelukast (SINGULAIR) 10 MG tablet Take 10 mg by mouth at bedtime.     pantoprazole (PROTONIX) 40 MG tablet Take 40 mg by mouth daily.     pregabalin (LYRICA) 50 MG capsule Take 1 capsule (50 mg total) by mouth 2 (two) times daily. 60 capsule 5   timolol (TIMOPTIC) 0.5 % ophthalmic solution SMARTSIG:In Eye(s)     traZODone (DESYREL) 100 MG tablet Take 1 tablet (100 mg total) by mouth at bedtime. 90 tablet 1   vitamin B-12 (CYANOCOBALAMIN) 1000 MCG tablet Take 1,000 mcg by mouth daily.     No  current facility-administered medications for this visit.     Musculoskeletal: Strength & Muscle Tone: within normal limits Gait & Station: normal Patient leans: N/A  Psychiatric Specialty Exam: Review of Systems  There were no vitals taken for this visit.There is no height or weight on file to calculate BMI.  General Appearance: Well Groomed  Eye Contact:  Good  Speech:  Clear and Coherent and Normal Rate  Volume:  Normal  Mood:  Euthymic  Affect:  Appropriate and Congruent  Thought Process:  Coherent and Goal Directed  Orientation:  Full (Time, Place, and Person)  Thought Content: Logical   Suicidal Thoughts:  No  Homicidal Thoughts:  No  Memory:  NA  Judgement:  Good  Insight:  Fair  Psychomotor Activity:  Normal  Concentration:  Concentration: Good  Recall:  Good  Fund of Knowledge: Good  Language: Good  Akathisia:  NA    AIMS (if indicated): not done  Assets:  Communication Skills Desire for Improvement Housing Leisure Time Resilience Transportation  ADL's:  Intact  Cognition: WNL  Sleep:  Good   Metabolic Disorder Labs: Lab Results  Component Value Date   HGBA1C 5.7 (H) 11/28/2017   MPG 116.89 11/28/2017   No results found for: "PROLACTIN" Lab Results  Component Value Date   CHOL 180 11/28/2017   TRIG 128 11/28/2017   HDL 31 (L) 11/28/2017   CHOLHDL 5.8 11/28/2017   VLDL 26 11/28/2017   LDLCALC 123 (H) 11/28/2017   Lab Results  Component Value Date   TSH 1.180 08/19/2021   TSH 4.830 (H) 08/31/2012    Therapeutic Level Labs: No results found for: "LITHIUM" No results found for: "VALPROATE" No results found for: "CBMZ"  Screenings: PHQ2-9    Flowsheet Row [facility] from [facility]11/20[facility]  PHQ-2 Total Score 1      Flowsheet Row Admission (Discharged) from 10/13/2022 in [facility] PERIOP Admission (Discharged) from 08/14/2021 in [facility]  ENDOSCOPY ED from 05/30/2021 in [facility] Department at [facility]  C-SSRS RISK CATEGORY No Risk No Risk No Risk       Collaboration of Care: Collaboration of Care: Medication Management AEB medication prescription  Patient/Guardian was advised Release of Information must be obtained prior to any record release in order to collaborate their care with an outside provider. Patient/Guardian was advised if they have not already done so to contact the registration department to sign all necessary forms in order for [place] to release information regarding their care.   Consent: Patient/Guardian gives verbal consent for treatment and assignment of benefits for services provided during this visit. Patient/Guardian expressed understanding and agreed to proceed.    Stasia Cavalier, MD 06/29/2023, 11:56 AM

## 2023-06-29 ENCOUNTER — Ambulatory Visit (HOSPITAL_COMMUNITY): Payer: Medicare Other | Admitting: Psychiatry

## 2023-06-29 VITALS — BP 118/71 | HR 69 | Ht 64.5 in | Wt 148.0 lb

## 2023-06-29 DIAGNOSIS — F330 Major depressive disorder, recurrent, mild: Secondary | ICD-10-CM

## 2023-06-29 MED ORDER — ESCITALOPRAM OXALATE 10 MG PO TABS
10.0000 mg | ORAL_TABLET | Freq: Every day | ORAL | 0 refills | Status: DC
Start: 2023-06-29 — End: 2023-09-28

## 2023-06-29 NOTE — Telephone Encounter (Signed)
Pt states that the insurance faxed a chronic condition form over and they have not heard anything and  threaten to cancel her insurance. Please advise.

## 2023-06-29 NOTE — Telephone Encounter (Signed)
Patient aware form has been signed and faxed

## 2023-06-30 ENCOUNTER — Encounter: Payer: Self-pay | Admitting: Gastroenterology

## 2023-06-30 ENCOUNTER — Encounter (HOSPITAL_COMMUNITY): Payer: Self-pay | Admitting: Psychiatry

## 2023-06-30 NOTE — H&P (Signed)
Pre-Procedure H&P   Patient ID: Brittany [name] is a 73 y.o. female.  Gastroenterology Provider: [name], DO  Referring Provider: Tawni Pummel, PA PCP: Kandyce Rud, MD  Date: 07/01/2023  HPI Ms. Brittany [name] is a 73 y.o. female who presents today for Esophagogastroduodenoscopy for GERD, regurgitation, nausea, vomiting .  Patient with worsening reflux symptoms despite increasing PPI.  No dysphagia or odynophagia.  She last underwent esophagogastroduodenoscopy in March 2023 demonstrating gastric intestinal metaplasia.  She was also noted to have these findings on EGD in 2022. In 2023 she underwent dilation with 52 [place] Maloney.  Regular Z-line and no signs of esophageal reflux were appreciated at that time.  The previous Nissen wrap was noted  Reports swallowing not improved after dilation  Hemoglobin 12 MCV 96 platelets 206,000 creatinine 0.9  EGD in 2020 also demonstrating gastritis   Past Medical History:  Diagnosis Date   Allergic genetic state    Anemia    Anxiety    Arthritis    Cataract    Chicken pox    Clostridioides difficile infection 09/05/2018   Complication of anesthesia    Depression    Family history of adverse reaction to anesthesia    mother - PONV   GERD (gastroesophageal reflux disease)    H/O hiatal hernia    History of GI bleed    Hypercholesteremia    Hyperlipidemia    Hypothyroidism    Osteoporosis    PONV (postoperative nausea and vomiting)    Stroke (HCC) 11/2017   Thyroid disease    Wears dentures    partial upper, full lower    Past Surgical History:  Procedure Laterality Date   ABDOMINAL HYSTERECTOMY  05/18/2000   total   BRAVO PH STUDY  05/26/2012   Procedure: BRAVO PH STUDY;  Surgeon: Shirley Friar, MD;  Location: [facility] ENDOSCOPY;  Service: Endoscopy;  Laterality: N/A;   CARPOMETACARPAL (CMC) FUSION OF THUMB Left 10/13/2022   Procedure: Left thumb carpometacarpal arthroplasty;  Surgeon: Kennedy Bucker,  MD;  Location: [facility];  Service: Orthopedics;  Laterality: Left;   CATARACT EXTRACTION     CHOLECYSTECTOMY  05/18/2004   COLONOSCOPY WITH PROPOFOL N/A 10/24/2018   Procedure: COLONOSCOPY WITH PROPOFOL;  Surgeon: Scot Jun, MD;  Location: [facility] ENDOSCOPY;  Service: Endoscopy;  Laterality: N/A;   COLONOSCOPY WITH PROPOFOL N/A 08/14/2021   Procedure: COLONOSCOPY WITH PROPOFOL;  Surgeon: [name], DO;  Location: [facility] ENDOSCOPY;  Service: Gastroenterology;  Laterality: N/A;   detatched retina surgery     ESOPHAGOGASTRODUODENOSCOPY  05/26/2012   Procedure: ESOPHAGOGASTRODUODENOSCOPY (EGD);  Surgeon: Shirley Friar, MD;  Location: [facility];  Service: Endoscopy;  Laterality: N/A;   ESOPHAGOGASTRODUODENOSCOPY  05/30/2012   Procedure: ESOPHAGOGASTRODUODENOSCOPY (EGD);  Surgeon: Florencia Reasons, MD;  Location: [facility] ENDOSCOPY;  Service: Endoscopy;  Laterality: N/A;   ESOPHAGOGASTRODUODENOSCOPY N/A 08/31/2012   Procedure: ESOPHAGOGASTRODUODENOSCOPY (EGD);  Surgeon: Graylin Shiver, MD;  Location: [facility] ENDOSCOPY;  Service: Endoscopy;  Laterality: N/A;   ESOPHAGOGASTRODUODENOSCOPY (EGD) WITH PROPOFOL N/A 10/24/2018   Procedure: ESOPHAGOGASTRODUODENOSCOPY (EGD) WITH PROPOFOL;  Surgeon: Scot Jun, MD;  Location: [facility] ENDOSCOPY;  Service: Endoscopy;  Laterality: N/A;   ESOPHAGOGASTRODUODENOSCOPY (EGD) WITH PROPOFOL N/A 08/05/2020   Procedure: ESOPHAGOGASTRODUODENOSCOPY (EGD) WITH PROPOFOL;  Surgeon: Toledo, Boykin Nearing, MD;  Location: [facility] ENDOSCOPY;  Service: Gastroenterology;  Laterality: N/A;   ESOPHAGOGASTRODUODENOSCOPY (EGD) WITH PROPOFOL N/A 08/14/2021   Procedure: ESOPHAGOGASTRODUODENOSCOPY (EGD) WITH PROPOFOL;  Surgeon: [name], DO;  Location:  [facility] ENDOSCOPY;  Service: Gastroenterology;  Laterality: N/A;   EYE SURGERY     FOOT SURGERY Right    HIP SURGERY  1970s   "hip sunk in"   left eye surgery for detached retina     LIVER BIOPSY  2007   NECK  SURGERY  05/18/2010   PLANTAR FASCIA RELEASE Left 10/10/2020   Procedure: ENDOSCOPIC PLANTAR FASC. RELEASE;  Surgeon: Rosetta Posner, DPM;  Location: [facility];  Service: Podiatry;  Laterality: Left;  ANESTHESIA- CHOICE   ROTATOR CUFF REPAIR  03/18/2012   right side   THYROID SURGERY     nodule removed, right side   TMJ ARTHROPLASTY  05/18/1990   TUBAL LIGATION  05/19/1979    Family History Sister- colon polyps No other h/o GI disease or malignancy  Review of Systems  Constitutional:  Negative for activity change, appetite change, chills, diaphoresis, fatigue, fever and unexpected weight change.  HENT:  Positive for trouble swallowing. Negative for voice change.   Respiratory:  Negative for shortness of breath and wheezing.   Cardiovascular:  Negative for chest pain, palpitations and leg swelling.  Gastrointestinal:  Negative for abdominal distention, abdominal pain, anal bleeding, blood in stool, constipation, diarrhea, nausea, rectal pain and vomiting.  Musculoskeletal:  Negative for arthralgias and myalgias.  Skin:  Negative for color change and pallor.  Neurological:  Negative for dizziness, syncope and weakness.  Psychiatric/Behavioral:  Negative for confusion.   All other systems reviewed and are negative.    Medications No current facility-administered medications on file prior to encounter.   Current Outpatient Medications on File Prior to Encounter  Medication Sig Dispense Refill   atorvastatin (LIPITOR) 40 MG tablet Take 40 mg by mouth daily.     DORZOLAMIDE HCL OP Apply to eye 2 (two) times daily.     levothyroxine (SYNTHROID, LEVOTHROID) 25 MCG tablet Take 25 mcg by mouth every morning.      montelukast (SINGULAIR) 10 MG tablet Take 10 mg by mouth at bedtime.     pantoprazole (PROTONIX) 40 MG tablet Take 40 mg by mouth daily.     pregabalin (LYRICA) 50 MG capsule Take 1 capsule (50 mg total) by mouth 2 (two) times daily. 60 capsule 5   traZODone (DESYREL)  100 MG tablet Take 1 tablet (100 mg total) by mouth at bedtime. 90 tablet 1   betamethasone dipropionate 0.05 % cream Apply topically daily.     brimonidine (ALPHAGAN) 0.15 % ophthalmic solution SMARTSIG:In Eye(s)     Cholecalciferol 50 MCG (2000 UT) TABS Take 2,000 Units by mouth daily.     timolol (TIMOPTIC) 0.5 % ophthalmic solution SMARTSIG:In Eye(s)     vitamin B-12 (CYANOCOBALAMIN) 1000 MCG tablet Take 1,000 mcg by mouth daily.      Pertinent medications related to GI and procedure were reviewed by me with the patient prior to the procedure   Current Facility-Administered Medications:    0.9 %  sodium chloride infusion, , Intravenous, Continuous, [name], DO, Last Rate: 20 mL/hr at 07/01/23 9604, New Bag at 07/01/23 5409  sodium chloride 20 mL/hr at 07/01/23 8119       Allergies  Allergen Reactions   Lithium Anaphylaxis   Morphine Sulfate Nausea And Vomiting   Fentanyl Other (See Comments)    Extreme Sedation  Other reaction(s): Lethargy (intolerance)    Morphine Nausea And Vomiting   Pseudoephedrine Hypertension        Pseudoephedrine Hcl Nausea Only and Hypertension   Allergies were reviewed by  me prior to the procedure  Objective   Body mass index is 24.84 kg/m. Vitals:   07/01/23 0708 07/01/23 0718  BP:  120/67  Pulse:  60  Resp:  18  Temp:  (!) 96.7 F (35.9 C)  TempSrc:  Temporal  SpO2:  98%  Weight: 66.7 kg   Height: 5' 4.5" (1.638 m)      Physical Exam Vitals and nursing note reviewed.  Constitutional:      General: She is not in acute distress.    Appearance: Normal appearance. She is not ill-appearing, toxic-appearing or diaphoretic.  HENT:     Head: Normocephalic and atraumatic.     Nose: Nose normal.     Mouth/Throat:     Mouth: Mucous membranes are moist.     Pharynx: Oropharynx is clear.     Comments: Multiple missing teeth Eyes:     General: No scleral icterus.    Extraocular Movements: Extraocular movements intact.   Cardiovascular:     Rate and Rhythm: Normal rate and regular rhythm.     Heart sounds: Murmur heard.     No friction rub. No gallop.  Pulmonary:     Effort: Pulmonary effort is normal. No respiratory distress.     Breath sounds: Normal breath sounds. No wheezing, rhonchi or rales.  Abdominal:     General: Bowel sounds are normal. There is no distension.     Palpations: Abdomen is soft.     Tenderness: There is no abdominal tenderness. There is no guarding or rebound.  Musculoskeletal:     Cervical back: Neck supple.     Right lower leg: No edema.     Left lower leg: No edema.  Skin:    General: Skin is warm and dry.     Coloration: Skin is not jaundiced or pale.  Neurological:     General: No focal deficit present.     Mental Status: She is alert and oriented to person, place, and time. Mental status is at baseline.  Psychiatric:        Mood and Affect: Mood normal.        Behavior: Behavior normal.        Thought Content: Thought content normal.        Judgment: Judgment normal.      Assessment:  Ms. Brittany [name] is a 73 y.o. female  who presents today for Esophagogastroduodenoscopy for GERD, regurgitation, nausea, vomiting .  Plan:  Esophagogastroduodenoscopy with possible intervention today  Esophagogastroduodenoscopy with possible biopsy, control of bleeding, polypectomy, and interventions as necessary has been discussed with the patient/patient representative. Informed consent was obtained from the patient/patient representative after explaining the indication, nature, and risks of the procedure including but not limited to death, bleeding, perforation, missed neoplasm/lesions, cardiorespiratory compromise, and reaction to medications. Opportunity for questions was given and appropriate answers were provided. Patient/patient representative has verbalized understanding is amenable to undergoing the procedure.   [name], DO  [facility]  Gastroenterology  Portions of the record may have been created with voice recognition software. Occasional wrong-word or 'sound-a-like' substitutions may have occurred due to the inherent limitations of voice recognition software.  Read the chart carefully and recognize, using context, where substitutions may have occurred.

## 2023-07-01 ENCOUNTER — Encounter
Admission: RE | Disposition: A | Discharge status: Home / Self Care | Payer: Self-pay | Source: Home / Self Care | Attending: Gastroenterology

## 2023-07-01 ENCOUNTER — Ambulatory Visit
Admission: RE | Admit: 2023-07-01 | Discharge: 2023-07-01 | Disposition: A | Discharge status: Home / Self Care | Payer: Medicare Other | Attending: Gastroenterology | Admitting: Gastroenterology

## 2023-07-01 ENCOUNTER — Ambulatory Visit: Payer: Medicare Other | Admitting: Anesthesiology

## 2023-07-01 ENCOUNTER — Encounter: Payer: Self-pay | Admitting: Gastroenterology

## 2023-07-01 DIAGNOSIS — Z87891 Personal history of nicotine dependence: Secondary | ICD-10-CM | POA: Diagnosis not present

## 2023-07-01 DIAGNOSIS — K449 Diaphragmatic hernia without obstruction or gangrene: Secondary | ICD-10-CM | POA: Insufficient documentation

## 2023-07-01 DIAGNOSIS — K219 Gastro-esophageal reflux disease without esophagitis: Secondary | ICD-10-CM | POA: Insufficient documentation

## 2023-07-01 DIAGNOSIS — E039 Hypothyroidism, unspecified: Secondary | ICD-10-CM | POA: Insufficient documentation

## 2023-07-01 DIAGNOSIS — K31A0 Gastric intestinal metaplasia, unspecified: Secondary | ICD-10-CM | POA: Insufficient documentation

## 2023-07-01 DIAGNOSIS — G709 Myoneural disorder, unspecified: Secondary | ICD-10-CM | POA: Insufficient documentation

## 2023-07-01 DIAGNOSIS — Z8673 Personal history of transient ischemic attack (TIA), and cerebral infarction without residual deficits: Secondary | ICD-10-CM | POA: Insufficient documentation

## 2023-07-01 DIAGNOSIS — M1990 Unspecified osteoarthritis, unspecified site: Secondary | ICD-10-CM | POA: Insufficient documentation

## 2023-07-01 DIAGNOSIS — J449 Chronic obstructive pulmonary disease, unspecified: Secondary | ICD-10-CM | POA: Diagnosis not present

## 2023-07-01 HISTORY — PX: ESOPHAGOGASTRODUODENOSCOPY (EGD) WITH PROPOFOL: SHX5813

## 2023-07-01 SURGERY — ESOPHAGOGASTRODUODENOSCOPY (EGD) WITH PROPOFOL
Anesthesia: Monitor Anesthesia Care

## 2023-07-01 MED ORDER — ONDANSETRON HCL 4 MG/2ML IJ SOLN
INTRAMUSCULAR | Status: AC
  Filled 2023-07-01: qty 2

## 2023-07-01 MED ORDER — LIDOCAINE HCL (CARDIAC) PF 100 MG/5ML IV SOSY
PREFILLED_SYRINGE | INTRAVENOUS | Status: DC | PRN
  Administered 2023-07-01: 60 mg via INTRAVENOUS

## 2023-07-01 MED ORDER — ONDANSETRON HCL 4 MG/2ML IJ SOLN
INTRAMUSCULAR | Status: DC | PRN
  Administered 2023-07-01: 4 mg via INTRAVENOUS

## 2023-07-01 MED ORDER — PROPOFOL 10 MG/ML IV BOLUS
INTRAVENOUS | Status: AC
  Filled 2023-07-01: qty 20

## 2023-07-01 MED ORDER — PROPOFOL 500 MG/50ML IV EMUL
INTRAVENOUS | Status: DC | PRN
  Administered 2023-07-01 (×3): 20 mg via INTRAVENOUS
  Administered 2023-07-01: 50 mg via INTRAVENOUS
  Administered 2023-07-01 (×2): 20 mg via INTRAVENOUS

## 2023-07-01 MED ORDER — LIDOCAINE HCL (PF) 2 % IJ SOLN
INTRAMUSCULAR | Status: AC
  Filled 2023-07-01: qty 5

## 2023-07-01 MED ORDER — SODIUM CHLORIDE 0.9 % IV SOLN: INTRAVENOUS | Status: DC

## 2023-07-01 NOTE — Transfer of Care (Signed)
Immediate Anesthesia Transfer of Care Note  Patient: Brittany [name]  Procedure(s) Performed: ESOPHAGOGASTRODUODENOSCOPY (EGD) WITH PROPOFOL  Patient Location: PACU and Endoscopy Unit  Anesthesia Type:MAC  Level of Consciousness: sedated  Airway & Oxygen Therapy: Patient Spontanous Breathing and Patient connected to nasal cannula oxygen  Post-op Assessment: Report given to RN and Post -op Vital signs reviewed and stable  Post vital signs: Reviewed and stable  Last Vitals:  Vitals Value Taken Time  BP 104/55 07/01/23 0751  Temp 36 C 07/01/23 0751  Pulse 53 07/01/23 0753  Resp 12 07/01/23 0753  SpO2 99 % 07/01/23 0753  Vitals shown include unfiled device data.  Last Pain:  Vitals:   07/01/23 0718  TempSrc: Temporal         Complications: No notable events documented.

## 2023-07-01 NOTE — Interval H&P Note (Signed)
History and Physical Interval Note: Preprocedure H&P from 07/01/23  was reviewed and there was no interval change after seeing and examining the patient.  Written consent was obtained from the patient after discussion of risks, benefits, and alternatives. Patient has consented to proceed with Esophagogastroduodenoscopy with possible intervention   07/01/2023 7:33 AM  Brittany [name]  has presented today for surgery, with the diagnosis of K21.9 (ICD-10-CM) - Gastroesophageal reflux disease, unspecified whether esophagitis present.  The various methods of treatment have been discussed with the patient and family. After consideration of risks, benefits and other options for treatment, the patient has consented to  Procedure(s): ESOPHAGOGASTRODUODENOSCOPY (EGD) WITH PROPOFOL (N/A) as a surgical intervention.  [name] history has been reviewed, patient examined, no change in status, stable for surgery.  I have reviewed the patient's chart and labs.  Questions were answered to the patient's satisfaction.     [name]

## 2023-07-01 NOTE — Anesthesia Postprocedure Evaluation (Signed)
Anesthesia Post Note  Patient: SHANDY CHECO  Procedure(s) Performed: ESOPHAGOGASTRODUODENOSCOPY (EGD) WITH PROPOFOL  Patient location during evaluation: PACU Anesthesia Type: MAC Level of consciousness: awake and alert Pain management: pain level controlled Vital Signs Assessment: post-procedure vital signs reviewed and stable Respiratory status: spontaneous breathing, nonlabored ventilation and respiratory function stable Cardiovascular status: blood pressure returned to baseline and stable Postop Assessment: no apparent nausea or vomiting Anesthetic complications: no   No notable events documented.   Last Vitals:  Vitals:   07/01/23 0801 07/01/23 0811  BP: 112/61 116/78  Pulse: (!) 56   Resp: 15 13  Temp:    SpO2: 100% 98%    Last Pain:  Vitals:   07/01/23 0801  TempSrc:   PainSc: 0-No pain                 Foye Deer

## 2023-07-01 NOTE — Anesthesia Preprocedure Evaluation (Addendum)
Anesthesia Evaluation  Patient identified by MRN, date of birth, ID band Patient awake    Reviewed: Allergy & Precautions, NPO status , Patient's Chart, lab work & pertinent test results  History of Anesthesia Complications (+) Family history of anesthesia reaction and history of anesthetic complications  Airway Mallampati: II  TM Distance: >3 FB Neck ROM: Full    Dental  (+) Lower Dentures, Partial Upper, Missing, Poor Dentition   Pulmonary COPD (seen on imaging as far back as 2009 per chart review), former smoker   Pulmonary exam normal breath sounds clear to auscultation       Cardiovascular Exercise Tolerance: Good Normal cardiovascular exam Rhythm:Regular Rate:Normal     Neuro/Psych  PSYCHIATRIC DISORDERS Anxiety Depression     Neuromuscular disease CVA (left vision decreased)    GI/Hepatic hiatal hernia,GERD  ,,  Endo/Other  Hypothyroidism    Renal/GU      Musculoskeletal  (+) Arthritis ,    Abdominal Normal abdominal exam  (+)   Peds  Hematology  (+) Blood dyscrasia, anemia   Anesthesia Other Findings Hypercholesteremia  PONV (postoperative nausea and vomiting) Hypothyroidism Depression  GERD (gastroesophageal reflux disease) H/O hiatal hernia  Anemia Cataract  Anxiety glaucoma Osteoporosis  Allergic genetic state Arthritis  Hyperlipidemia  Thyroid disease Fentanyl makes her sleepy History of GI bleed Wears dentures  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     Reproductive/Obstetrics                              Anesthesia Physical Anesthesia Plan  ASA: 3  Anesthesia Plan: General   Post-op Pain Management:    Induction: Intravenous  PONV Risk Score and Plan: Propofol infusion  Airway Management Planned:   Additional Equipment:   Intra-op Plan:   Post-operative Plan:   Informed Consent: I have reviewed the  patients History and Physical, chart, labs and discussed the procedure including the risks, benefits and alternatives for the proposed anesthesia with the patient or authorized representative who has indicated his/her understanding and acceptance.     Dental Advisory Given  Plan Discussed with: Anesthesiologist, CRNA and Surgeon  Anesthesia Plan Comments:          Anesthesia Quick Evaluation

## 2023-07-01 NOTE — Op Note (Signed)
[facility] Gastroenterology Patient Name: Brittany [name] Procedure Date: 07/01/2023 7:21 AM MRN: [id number] Account #: [id number] Date of Birth: Apr 14, 1951 Admit Type: Outpatient Age: 73 Room: [facility] ENDO ROOM 1 Gender: Female Note Status: Supervisor Override Instrument Name: Upper Endoscope [id number] Procedure:             Upper GI endoscopy Indications:           Gastro-esophageal reflux disease Providers:             [name] DO, DO Referring MD:          Hassell Halim MD (Referring MD) Medicines:             Monitored Anesthesia Care Complications:         No immediate complications. Estimated blood loss: None. Procedure:             Pre-Anesthesia Assessment:                        - Prior to the procedure, a History and Physical was                         performed, and patient medications and allergies were                         reviewed. The patient is competent. The risks and                         benefits of the procedure and the sedation options and                         risks were discussed with the patient. All questions                         were answered and informed consent was obtained.                         Patient identification and proposed procedure were                         verified by the physician, the nurse, the anesthetist                         and the technician in the endoscopy suite. Mental                         Status Examination: alert and oriented. Airway                         Examination: normal oropharyngeal airway and neck                         mobility. Respiratory Examination: clear to                         auscultation. CV Examination: RRR, no murmurs, no S3                         or S4. Prophylactic Antibiotics: The patient does not  require prophylactic antibiotics. Prior                         Anticoagulants: The patient has taken no anticoagulant                          or antiplatelet agents. ASA Grade Assessment: III - A                         patient with severe systemic disease. After reviewing                         the risks and benefits, the patient was deemed in                         satisfactory condition to undergo the procedure. The                         anesthesia plan was to use monitored anesthesia care                         (MAC). Immediately prior to administration of                         medications, the patient was re-assessed for adequacy                         to receive sedatives. The heart rate, respiratory                         rate, oxygen saturations, blood pressure, adequacy of                         pulmonary ventilation, and response to care were                         monitored throughout the procedure. The physical                         status of the patient was re-assessed after the                         procedure.                        After obtaining informed consent, the endoscope was                         passed under direct vision. Throughout the procedure,                         the patient's blood pressure, pulse, and oxygen                         saturations were monitored continuously. The Endoscope                         was introduced through the mouth, and advanced to the  third part of duodenum. The upper GI endoscopy was                         accomplished without difficulty. The patient tolerated                         the procedure well. Findings:      The duodenal bulb, first portion of the duodenum, second portion of the       duodenum and third portion of the duodenum were normal. Estimated blood       loss: none.      Evidence of a prior Nissen fundoplication was found in the cardia. This       was characterized by healthy appearing mucosa; appears lax. Estimated       blood loss: none.      Localized moderate mucosal changes characterized by  erythema consistent       with previous gastric intestinal metaplasia were found in the gastric       antrum. Not biopsied as this was performed two years ago and she is not       yet up for surveillance. Estimated blood loss: none. No nodularity or       lesion appreciated. Imaging was performed using white light and narrow       band imaging to visualize the mucosa. Estimated blood loss: none.      Esophagogastric landmarks were identified: the gastroesophageal junction       was found at 35 cm from the incisors.      A 2 cm hiatal hernia was present. Estimated blood loss: none.      The Z-line was regular. Estimated blood loss: none.      The exam of the esophagus was otherwise normal. Impression:            - Normal duodenal bulb, first portion of the duodenum,                         second portion of the duodenum and third portion of                         the duodenum.                        - A Nissen fundoplication was found, characterized by                         healthy appearing mucosa; appears lax.                        - Erythema consistent with previous gastric intestinal                         metaplasia mucosa in the antrum.                        - Esophagogastric landmarks identified.                        - 2 cm hiatal hernia.                        - Z-line regular.                        -  No specimens collected. Recommendation:        - Patient has a contact number available for                         emergencies. The signs and symptoms of potential                         delayed complications were discussed with the patient.                         Return to normal activities tomorrow. Written                         discharge instructions were provided to the patient.                        - Discharge patient to home.                        - Resume previous diet.                        - Continue present medications.                        - Return to  GI clinic as previously scheduled.                        - Consider change from ppi/h2 blocker to voquenza                         alone.                        If this does not improve symptoms, consider Bravo                         study, however, this may be functional                         dyspepsia/reflux.                        - The findings and recommendations were discussed with                         the patient. Procedure Code(s):     --- Professional ---                        [phone], Esophagogastroduodenoscopy, flexible,                         transoral; diagnostic, including collection of                         specimen(s) by brushing or washing, when performed                         (separate procedure) Diagnosis Code(s):     --- Professional ---  K44.9, Diaphragmatic hernia without obstruction or                         gangrene                        R12, Heartburn CPT copyright 2022 American Medical Association. All rights reserved. The codes documented in this report are preliminary and upon coder review may  be revised to meet current compliance requirements. Attending Participation:      I personally performed the entire procedure. Elfredia Nevins, DO [name] DO, DO 07/01/2023 7:56:10 AM This report has been signed electronically. Number of Addenda: 0 Note Initiated On: 07/01/2023 7:21 AM Estimated Blood Loss:  Estimated blood loss: none.      [facility]

## 2023-07-02 ENCOUNTER — Encounter: Payer: Self-pay | Admitting: Gastroenterology

## 2023-07-19 ENCOUNTER — Encounter

## 2023-07-19 ENCOUNTER — Telehealth

## 2023-07-19 NOTE — Telephone Encounter (Signed)
 Last OV 06/24/2023  Next OV 12/22/2023  Last lab 06/17/2023

## 2023-07-19 NOTE — Telephone Encounter (Signed)
 Pt states that [employer] needs a prior auth for the RX   vitamin D (ERGOCALCIFEROL) 1.25 MG (50000 UT) CAPS capsule QTY 12 refills 3  vitamin D (ERGOCALCIFEROL) 1.25 MG (50000 UT) CAPS capsule QTY 90 refills 3    ferrous sulfate (IRON 325) 325 (65 Fe) MG tablet QTY 90 Refills 3     she needs a RX refill for cyanocobalamin 1000 MCG tablet QTY 90 Refills 3 and thiamine 100 MG tablet QTY 90 refills 3 sent to [facility]. Please advise.

## 2023-07-26 MED ORDER — THIAMINE HCL 100 MG PO TABS
100 | ORAL_TABLET | Freq: Every day | ORAL | 1 refills | 30.00000 days | Status: DC
Start: 2023-07-26 — End: 2024-02-15

## 2023-07-26 MED ORDER — CYANOCOBALAMIN 1000 MCG PO TABS
1000 | ORAL_TABLET | Freq: Every day | ORAL | 3 refills | Status: DC
Start: 2023-07-26 — End: 2024-02-15

## 2023-07-26 MED ORDER — VITAMIN D (ERGOCALCIFEROL) 1.25 MG (50000 UT) PO CAPS
1.25 | ORAL_CAPSULE | ORAL | 3 refills | Status: DC
Start: 2023-07-26 — End: 2024-01-13

## 2023-07-26 MED ORDER — FERROUS SULFATE 325 (65 FE) MG PO TABS
325 | ORAL_TABLET | Freq: Every day | ORAL | 3 refills | Status: AC
Start: 2023-07-26 — End: ?

## 2023-08-18 ENCOUNTER — Inpatient Hospital Stay: Admit: 2023-08-18 | Payer: MEDICARE | Attending: Family Medicine | Primary: Family Medicine

## 2023-08-18 VITALS — Ht 62.99 in

## 2023-08-18 DIAGNOSIS — Z1231 Encounter for screening mammogram for malignant neoplasm of breast: Secondary | ICD-10-CM

## 2023-09-27 NOTE — Progress Notes (Unsigned)
 BH MD/PA/NP OP Progress Note  09/28/2023 3:26 PM YOLANDO MCGINN  MRN:  [id number]  Visit Diagnosis:    ICD-10-CM   1. MDD (major depressive disorder), recurrent episode, mild (HCC)  F33.0 escitalopram  (LEXAPRO ) 10 MG tablet    traZODone  (DESYREL ) 100 MG tablet      Assessment: SUAN SILLMAN is a 73 y.o. female with a history of MDD who presented to [facility] at [facility] for initial evaluation on 02/11/22.  Patient had followed with Dr. Levie Ream in the past.   At initial evaluation patient reported experiencing increased mood lability with periods of depressed mood, tearfulness, decreased energy secondary to interpersonal stressors most often around her son.  Patient denied any SI/HI or thoughts of self-harm.  She denied any paranoia, delusions, AVH, or manic symptoms.  She also endorsed experiencing periods of increased anger out of proportion to triggers.  During which time she turns the anger internal and does not act out verbally or physically.  Patient felt that since decreasing Celexa  3 years ago she has been unstable.  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  It also showed mild global parenchymal volume loss and chronic white matter microangiopathy.   Nora Beal presents for follow-up evaluation. Today, 09/28/23, patient reports that there mood has been stable in the interim. The financial stressors with her kids is still continuing which she is frustrated by, but it is manageable. She will continue monthly therapy with Gwen.  We will continue on her current medication regimen and follow-up in 2 months.  Psychotherapeutic interventions were used during today's session. From 2:32 PM to 2:55 PM we used empathic listening techniques and provided support. Used supportive interviewing techniques to validate patients feelings. Worked on cognitive re framing techniques and  focusing on behavioral activation.  Improvement was evidenced by patient's participation.   Plan:  - Continue Lexapro  10 mg QD - Continue trazodone  100 mg QHS  - Pregabalin  50 mg at bedtime for small fiber neuropathy, managed by her neurologist - Labs reviewed, TSH WNL - Continue with [name] for therapy once a month - Had went to a grief group, not a good fit. Thinking about a divorce group - Collateral obtained from Dr. Levie Ream - Follow up in 2 months  Chief Complaint:  Chief Complaint  Patient presents with   Follow-up   HPI: Rosalea presents reporting that things have not been too bad in the interim. She is still going to work part time which has been going well, she has picked up more hours on the weekends to pay for her trip to [place]. Things at the home have been about the same in regards to the relationship with her sons. She is reaching the point that she needs her kids to pay her back the 70,000 dollars she has lent them. Toby had promised to pay her back the money he borrowed after she last lent him money, but returned 5000 less then he borrowed. Patient expresses frustration with her kids around this. Support was provided.  Mood wise things have been stable in the interim.  She is still dealing with the acid reflux episodes once a week. It can take a few days to recover after one of the flair ups.  Anairis has an endoscopy scheduled for later this week.   Has not heard from [place] much recently. She has made plans to  visit [place] with her son in November of this year.   Medication wise she continues the Lexapro  and denies any adverse side effects. The previously reported acid reflux has mostly improved after meeting with her GI doctor who started on famotidine .   Past Psychiatric History: Patient reports first experiencing symptoms in 1998 with a final diagnosis coming in 1999.  She was voluntarily hospitalized in 2000 with severe depression.  She denies any active SI but does note  passive SI with no intent or plan at that time.  Patient saw Dr. Sibyl Drafts, Dr. Wayna Hails, and Dr. Levie Ream during which time she was prescribed Depakote, Celexa , nortriptyline, trazodone , and clorazepate .  She reports being stable until 2020 when Celexa  was decreased to 20 mg.  Cymbalta  was started and then discontinued due to weight gain.  Started Lexapro  on 04/13/2022  Patient denies any history of substance use.  Past Medical History:  Past Medical History:  Diagnosis Date   Allergic genetic state    Anemia    Anxiety    Arthritis    Cataract    Chicken pox    Clostridioides difficile infection 09/05/2018   Complication of anesthesia    Depression    Family history of adverse reaction to anesthesia    mother - PONV   GERD (gastroesophageal reflux disease)    H/O hiatal hernia    History of GI bleed    Hypercholesteremia    Hyperlipidemia    Hypothyroidism    Osteoporosis    PONV (postoperative nausea and vomiting)    Stroke (HCC) 11/2017   Thyroid  disease    Wears dentures    partial upper, full lower    Past Surgical History:  Procedure Laterality Date   ABDOMINAL HYSTERECTOMY  05/18/2000   total   BRAVO PH STUDY  05/26/2012   Procedure: BRAVO PH STUDY;  Surgeon: Yvetta Herbert, MD;  Location: [facility];  Service: Endoscopy;  Laterality: N/A;   CARPOMETACARPAL (CMC) FUSION OF THUMB Left 10/13/2022   Procedure: Left thumb carpometacarpal arthroplasty;  Surgeon: Molli Angelucci, MD;  Location: [facility];  Service: Orthopedics;  Laterality: Left;   CATARACT EXTRACTION     CHOLECYSTECTOMY  05/18/2004   COLONOSCOPY WITH PROPOFOL  N/A 10/24/2018   Procedure: COLONOSCOPY WITH PROPOFOL ;  Surgeon: Cassie Click, MD;  Location: [facility] ENDOSCOPY;  Service: Endoscopy;  Laterality: N/A;   COLONOSCOPY WITH PROPOFOL  N/A 08/14/2021   Procedure: COLONOSCOPY WITH PROPOFOL ;  Surgeon: Quintin Buckle, DO;  Location: [facility] ENDOSCOPY;  Service: Gastroenterology;   Laterality: N/A;   detatched retina surgery     ESOPHAGOGASTRODUODENOSCOPY  05/26/2012   Procedure: ESOPHAGOGASTRODUODENOSCOPY (EGD);  Surgeon: Yvetta Herbert, MD;  Location: [facility];  Service: Endoscopy;  Laterality: N/A;   ESOPHAGOGASTRODUODENOSCOPY  05/30/2012   Procedure: ESOPHAGOGASTRODUODENOSCOPY (EGD);  Surgeon: Brice Campi, MD;  Location: [facility];  Service: Endoscopy;  Laterality: N/A;   ESOPHAGOGASTRODUODENOSCOPY N/A 08/31/2012   Procedure: ESOPHAGOGASTRODUODENOSCOPY (EGD);  Surgeon: Celedonio Coil, MD;  Location: [facility] ENDOSCOPY;  Service: Endoscopy;  Laterality: N/A;   ESOPHAGOGASTRODUODENOSCOPY (EGD) WITH PROPOFOL  N/A 10/24/2018   Procedure: ESOPHAGOGASTRODUODENOSCOPY (EGD) WITH PROPOFOL ;  Surgeon: Cassie Click, MD;  Location: [facility] ENDOSCOPY;  Service: Endoscopy;  Laterality: N/A;   ESOPHAGOGASTRODUODENOSCOPY (EGD) WITH PROPOFOL  N/A 08/05/2020   Procedure: ESOPHAGOGASTRODUODENOSCOPY (EGD) WITH PROPOFOL ;  Surgeon: Toledo, Alphonsus Jeans, MD;  Location: [facility] ENDOSCOPY;  Service: Gastroenterology;  Laterality: N/A;   ESOPHAGOGASTRODUODENOSCOPY (EGD) WITH PROPOFOL  N/A 08/14/2021   Procedure: ESOPHAGOGASTRODUODENOSCOPY (EGD) WITH PROPOFOL ;  Surgeon: Mamie Searles,  Eugena Herter, DO;  Location: [facility] ENDOSCOPY;  Service: Gastroenterology;  Laterality: N/A;   ESOPHAGOGASTRODUODENOSCOPY (EGD) WITH PROPOFOL  N/A 07/01/2023   Procedure: ESOPHAGOGASTRODUODENOSCOPY (EGD) WITH PROPOFOL ;  Surgeon: Quintin Buckle, DO;  Location: [facility] ENDOSCOPY;  Service: Gastroenterology;  Laterality: N/A;   EYE SURGERY     FOOT SURGERY Right    HIP SURGERY  1970s   "hip sunk in"   left eye surgery for detached retina     LIVER BIOPSY  2007   NECK SURGERY  05/18/2010   PLANTAR FASCIA RELEASE Left 10/10/2020   Procedure: ENDOSCOPIC PLANTAR FASC. RELEASE;  Surgeon: Pink Bridges, DPM;  Location: [facility];  Service: Podiatry;  Laterality: Left;  ANESTHESIA- CHOICE   ROTATOR CUFF REPAIR   03/18/2012   right side   THYROID  SURGERY     nodule removed, right side   TMJ ARTHROPLASTY  05/18/1990   TUBAL LIGATION  05/19/1979    Family Psychiatric History: Both of her kids see psychiatrists for ADHD  Family History:  Family History  Problem Relation Age of Onset   Hypertension Mother    Kidney disease Mother    Heart attack Mother    Osteoporosis Mother    Lung cancer Father    Cancer Sister    Leukemia Sister    Colon polyps Sister    Ovarian cancer Paternal Aunt    Ovarian cancer Maternal Grandmother     Social History:  Social History   Socioeconomic History   Marital status: Divorced    Spouse name: Not on file   Number of children: 2   Years of education: Not on file   Highest education level: Not on file  Occupational History   Not on file  Tobacco Use   Smoking status: Former    Current packs/day: 0.00    Average packs/day: 1.5 packs/day for 20.0 years (30.0 ttl pk-yrs)    Types: Cigarettes    Start date: 02/05/1988    Quit date: 02/05/2008    Years since quitting: 15.6   Smokeless tobacco: Never  Vaping Use   Vaping status: Never Used  Substance and Sexual Activity   Alcohol use: No   Drug use: No   Sexual activity: Not on file  Other Topics Concern   Not on file  Social History Narrative   Left Handed    Lives in a townhouse       Are you currently employed ?    What is your current occupation? retired   Do you live at home alone?yes   Who lives with you?    What type of home do you live in: 1 story or 2 story? two   Caffeine none    Social Drivers of Corporate investment banker Strain: Patient Declined (03/11/2023)   Received from [facility]   Overall Financial Resource Strain (CARDIA)    Difficulty of Paying Living Expenses: Patient declined  Food Insecurity: Patient Declined (03/11/2023)   [facility]   Hunger Vital Sign    Worried About Running Out of Food in the Last Year:  Patient declined    Ran Out of Food in the Last Year: Patient declined  Transportation Needs: Patient Declined (03/11/2023)   Received from [facility] - Transportation    In the past 12 months, has lack of transportation kept you from medical appointments or from getting medications?: Patient declined    Lack of Transportation (Non-Medical):  Patient declined  Physical Activity: Not on file  Stress: Not on file  Social Connections: Unknown (09/30/2021)   Received from [facility]   Social Network    Social Network: Not on file    Allergies:  Allergies  Allergen Reactions   Lithium Anaphylaxis   Morphine Sulfate Nausea And Vomiting   Fentanyl  Other (See Comments)    Extreme Sedation  Other reaction(s): Lethargy (intolerance)    Morphine Nausea And Vomiting   Pseudoephedrine Hypertension        Pseudoephedrine Hcl Nausea Only and Hypertension    Current Medications: Current Outpatient Medications  Medication Sig Dispense Refill   atorvastatin  (LIPITOR) 40 MG tablet Take 40 mg by mouth daily.     betamethasone dipropionate 0.05 % cream Apply topically daily.     brimonidine (ALPHAGAN) 0.15 % ophthalmic solution SMARTSIG:In Eye(s)     Cholecalciferol 50 MCG (2000 UT) TABS Take 2,000 Units by mouth daily.     clorazepate  (TRANXENE ) 7.5 MG tablet Take 7.5 mg by mouth.     DORZOLAMIDE HCL OP Apply to eye 2 (two) times daily.     levothyroxine  (SYNTHROID , LEVOTHROID) 25 MCG tablet Take 25 mcg by mouth every morning.      montelukast  (SINGULAIR ) 10 MG tablet Take 10 mg by mouth at bedtime.     pantoprazole  (PROTONIX ) 40 MG tablet Take 40 mg by mouth daily.     pregabalin  (LYRICA ) 50 MG capsule Take 1 capsule (50 mg total) by mouth 2 (two) times daily. 60 capsule 5   timolol (TIMOPTIC) 0.5 % ophthalmic solution SMARTSIG:In Eye(s)     vitamin B-12 (CYANOCOBALAMIN ) 1000 MCG tablet Take 1,000 mcg by mouth daily.     escitalopram  (LEXAPRO )  10 MG tablet Take 1 tablet (10 mg total) by mouth daily. 90 tablet 0   traZODone  (DESYREL ) 100 MG tablet Take 1 tablet (100 mg total) by mouth at bedtime. 90 tablet 1   No current facility-administered medications for this visit.     Musculoskeletal: Strength & Muscle Tone: within normal limits Gait & Station: normal Patient leans: N/A  Psychiatric Specialty Exam: Review of Systems  Blood pressure 108/65, pulse 81, height 5' 4.5" (1.638 m), weight 146 lb (66.2 kg).Body mass index is 24.67 kg/m.  General Appearance: Well Groomed  Eye Contact:  Good  Speech:  Clear and Coherent and Normal Rate  Volume:  Normal  Mood:  Euthymic  Affect:  Appropriate and Congruent  Thought Process:  Coherent and Goal Directed  Orientation:  Full (Time, Place, and Person)  Thought Content: Logical   Suicidal Thoughts:  No  Homicidal Thoughts:  No  Memory:  NA  Judgement:  Good  Insight:  Fair  Psychomotor Activity:  Normal  Concentration:  Concentration: Good  Recall:  Good  Fund of Knowledge: Good  Language: Good  Akathisia:  NA    AIMS (if indicated): not done  Assets:  Communication Skills Desire for Improvement Housing Leisure Time Resilience Transportation  ADL's:  Intact  Cognition: WNL  Sleep:  Good   Metabolic Disorder Labs: Lab Results  Component Value Date   HGBA1C 5.7 (H) 11/28/2017   MPG 116.89 11/28/2017   No results found for: "PROLACTIN" Lab Results  Component Value Date   CHOL 180 11/28/2017   TRIG 128 11/28/2017   HDL 31 (L) 11/28/2017   CHOLHDL 5.8 11/28/2017   VLDL 26 11/28/2017   LDLCALC 123 (H) 11/28/2017   Lab Results  Component Value Date  TSH 1.180 08/19/2021   TSH 4.830 (H) 08/31/2012    Therapeutic Level Labs: No results found for: "LITHIUM" No results found for: "VALPROATE" No results found for: "CBMZ"   Screenings: PHQ2-9    Flowsheet Row [facility] from [facility]11/20[facility] in [facility]  PHQ-2  Total Score 1      Flowsheet Row Admission (Discharged) from 07/01/2023 in [facility] Admission (Discharged) from 10/13/2022 in [place] [facility] PERIOP Admission (Discharged) from 08/14/2021 in [facility]  C-SSRS RISK CATEGORY No Risk No Risk No Risk       Collaboration of Care: Collaboration of Care: Medication Management AEB medication prescription  Patient/Guardian was advised Release of Information must be obtained prior to any record release in order to collaborate their care with an outside provider. Patient/Guardian was advised if they have not already done so to contact the registration department to sign all necessary forms in order for us  to release information regarding their care.   Consent: Patient/Guardian gives verbal consent for treatment and assignment of benefits for services provided during this visit. Patient/Guardian expressed understanding and agreed to proceed.    Yves Herb, MD 09/28/2023, 3:26 PM

## 2023-09-28 ENCOUNTER — Ambulatory Visit (HOSPITAL_COMMUNITY): Payer: Medicare Other | Admitting: Psychiatry

## 2023-09-28 ENCOUNTER — Encounter (HOSPITAL_COMMUNITY): Payer: Self-pay | Admitting: Psychiatry

## 2023-09-28 ENCOUNTER — Other Ambulatory Visit: Payer: Self-pay

## 2023-09-28 VITALS — BP 108/65 | HR 81 | Ht 64.5 in | Wt 146.0 lb

## 2023-09-28 DIAGNOSIS — F330 Major depressive disorder, recurrent, mild: Secondary | ICD-10-CM | POA: Diagnosis not present

## 2023-09-28 MED ORDER — ESCITALOPRAM OXALATE 10 MG PO TABS: 10.0000 mg | ORAL_TABLET | Freq: Every day | ORAL | 0 refills | Status: AC

## 2023-09-28 MED ORDER — TRAZODONE HCL 100 MG PO TABS: 100.0000 mg | ORAL_TABLET | Freq: Every day | ORAL | 1 refills | Status: AC

## 2023-09-28 NOTE — Addendum Note (Signed)
 Addended by: Donnelly Gainer on: 09/28/2023 03:26 PM   Modules accepted: Level of Service

## 2023-10-13 ENCOUNTER — Encounter (HOSPITAL_COMMUNITY): Payer: Self-pay

## 2023-10-13 ENCOUNTER — Telehealth (HOSPITAL_COMMUNITY): Payer: Self-pay

## 2023-10-13 NOTE — Telephone Encounter (Signed)
 Contacted pt regarding Mychart Message sent about discontinuing services due to a bill she is receiving for a copay she has already paid. I assured the patient that she does not have a balance with [place] or [facility]. I let her know that I spoke with pt accounting and they advised that the statement was generated in error and they were going to escalate it to management. She stated that she appreciated that follow up but also was discontinuing care due to the "changes in check in process". When I asked for clarification, she stated that at times she is told that they cannot take payments. (Patient has paid the copayment on site at each visit after review). I apologized for any inconveniences and offered to have her seen at one of the other locations. She refused and states that she has "already made other arrangements". I advised pt to call back any time and we would be happy to get her scheduled at the [facility] office or any of our other locations and did not want her to go without care. She voiced appreciation and ended the call. Provider notified.

## 2023-10-16 ENCOUNTER — Emergency Department

## 2023-10-16 ENCOUNTER — Emergency Department
Admission: EM | Admit: 2023-10-16 | Discharge: 2023-10-16 | Disposition: A | Discharge status: Home / Self Care | Attending: Emergency Medicine | Admitting: Emergency Medicine

## 2023-10-16 ENCOUNTER — Other Ambulatory Visit: Payer: Self-pay

## 2023-10-16 DIAGNOSIS — S80212A Abrasion, left knee, initial encounter: Secondary | ICD-10-CM | POA: Diagnosis not present

## 2023-10-16 DIAGNOSIS — Y92480 Sidewalk as the place of occurrence of the external cause: Secondary | ICD-10-CM | POA: Insufficient documentation

## 2023-10-16 DIAGNOSIS — W101XXA Fall (on)(from) sidewalk curb, initial encounter: Secondary | ICD-10-CM | POA: Diagnosis not present

## 2023-10-16 DIAGNOSIS — S80211A Abrasion, right knee, initial encounter: Secondary | ICD-10-CM | POA: Diagnosis present

## 2023-10-16 DIAGNOSIS — S0083XA Contusion of other part of head, initial encounter: Secondary | ICD-10-CM | POA: Diagnosis not present

## 2023-10-16 DIAGNOSIS — M25561 Pain in right knee: Secondary | ICD-10-CM

## 2023-10-16 DIAGNOSIS — T07XXXA Unspecified multiple injuries, initial encounter: Secondary | ICD-10-CM

## 2023-10-16 MED ORDER — BACITRACIN ZINC 500 UNIT/GM EX OINT: TOPICAL_OINTMENT | Freq: Once | CUTANEOUS | Status: DC

## 2023-10-16 NOTE — ED Triage Notes (Signed)
 Pt to ED from [facility] for mechanical fall this morning, tripped on curb at [facility]. Small abrasions on knees and L forehead.. Denies LOC, no vision changes. Speech clear. Alert and oriented.

## 2023-10-16 NOTE — Discharge Instructions (Signed)
 Please follow-up with your primary care provider for symptoms that are not improving over the next week or so.  Rest and use ice on and off 20 minutes/h.  Keep the abrasions clean and dry.  Apply antibiotic ointment daily.  Keep open to air when not at risk for getting wet or dirty.  For any symptoms that changes or worsens if you are unable to see your primary care provider, please return to the emergency department.

## 2023-10-16 NOTE — ED Notes (Signed)
 Patient is alert and oriented, Family at bedside. No signs of distress.

## 2023-10-16 NOTE — ED Provider Notes (Signed)
 [facility] Provider Note    Event Date/Time   First MD Initiated Contact with Patient 10/16/23 1214     (approximate)   History   Fall   HPI  Brittany [name] is a 73 y.o. female with history of hyperlipidemia, CVA, IDA, and as listed in EMR presents to the emergency department for treatment and evaluation after sustaining a mechanical, nonsyncopal fall this morning.  She tripped on the curb at [employer].  She has small abrasions to both knees and the left side of her forehead.  No loss of consciousness or vision changes.  Sent from [facility] for evaluation.      Physical Exam   Triage Vital Signs: ED Triage Vitals  Encounter Vitals Group     BP 10/16/23 1157 129/82     Systolic BP Percentile --      Diastolic BP Percentile --      Pulse Rate 10/16/23 1157 63     Resp 10/16/23 1157 20     Temp 10/16/23 1157 98 F (36.7 C)     Temp Source 10/16/23 1157 Oral     SpO2 10/16/23 1157 98 %     Weight 10/16/23 1159 148 lb (67.1 kg)     Height 10/16/23 1159 5\' 4"  (1.626 m)     Head Circumference --      Peak Flow --      Pain Score 10/16/23 1157 5     Pain Loc --      Pain Education --      Exclude from Growth Chart --     Most recent vital signs: Vitals:   10/16/23 1157  BP: 129/82  Pulse: 63  Resp: 20  Temp: 98 F (36.7 C)  SpO2: 98%    General: Awake, no distress.  CV:  Good peripheral perfusion.  Resp:  Normal effort.  Abd:  No distention.  Other:  Abrasion above the left eyebrow on the forehead without laceration or active bleeding.  Abrasion to the prepatellar surface of the right knee.  Right knee pain with attempt to perform flexion.  Superficial abrasion to the prepatellar surface of the left knee.   ED Results / Procedures / Treatments   Labs (all labs ordered are listed, but only abnormal results are displayed) Labs Reviewed - No data to display   EKG     RADIOLOGY  Image and radiology report reviewed and interpreted by  me. Radiology report consistent with the same.  CT head and cervical spine are negative for acute concerns.  X-ray of the right knee negative for fracture.  PROCEDURES:  Critical Care performed: No  Procedures   MEDICATIONS ORDERED IN ED:  Medications - No data to display   IMPRESSION / MDM / ASSESSMENT AND PLAN / ED COURSE   I have reviewed the triage note.  Differential diagnosis includes, but is not limited to, minor head injury, forehead abrasion, concussion, ICH.  Patella fracture, knee contusion, knee strain, abrasion  Patient's presentation is most consistent with acute presentation with potential threat to life or bodily function.  73 year old female presenting to the emergency department for treatment and evaluation after mechanical, nonsyncopal fall while at [facility].  See HPI for further details.  CT image of the head and cervical spine are both negative for acute concerns.  Image of the right knee reveals no patella fracture.  Results were discussed with the patient and family who feel reassured.  Plan will be to have her use  antibiotic ointment on the abrasions to the forehead and knees for the next few days.  She was encouraged to use ice 20 minutes/h while awake.  If symptoms or not improving over the week, she was instructed to see her primary care provider.  For symptoms that change or worsen if she is unable to schedule an appointment, she was encouraged to return to the emergency department.      FINAL CLINICAL IMPRESSION(S) / ED DIAGNOSES   Final diagnoses:  Acute pain of right knee  Forehead contusion, initial encounter  Abrasions of multiple sites     Rx / DC Orders   ED Discharge Orders     None        Note:  This document was prepared using Dragon voice recognition software and may include unintentional dictation errors.   Sherryle Don, FNP 10/16/23 1421    Lind Repine, MD 10/16/23 1528

## 2023-11-30 ENCOUNTER — Ambulatory Visit (HOSPITAL_COMMUNITY): Admitting: Psychiatry

## 2023-12-17 ENCOUNTER — Inpatient Hospital Stay: Admit: 2023-12-17 | Payer: Medicare (Managed Care) | Primary: Family Medicine

## 2023-12-17 DIAGNOSIS — E119 Type 2 diabetes mellitus without complications: Principal | ICD-10-CM

## 2023-12-17 LAB — COMPREHENSIVE METABOLIC PANEL
ALT: 21 U/L (ref 10–35)
AST: 23 U/L (ref 10–38)
Albumin/Globulin Ratio: 1.6 (ref 0.8–1.7)
Albumin: 4.2 g/dL (ref 3.4–5.0)
Alk Phosphatase: 86 U/L (ref 45–117)
Anion Gap: 12 mmol/L (ref 3.0–18.0)
BUN/Creatinine Ratio: 18 (ref 12–20)
BUN: 13 mg/dL (ref 6–23)
CO2: 23 mmol/L (ref 21–32)
Calcium: 9.1 mg/dL (ref 8.5–10.1)
Chloride: 107 mmol/L (ref 98–107)
Creatinine: 0.74 mg/dL (ref 0.6–1.3)
Est, Glom Filt Rate: 86 ml/min/1.73m2 (ref >60)
Globulin: 2.7 g/dL (ref 2.0–4.0)
Glucose: 106 mg/dL (ref 74–108)
Potassium: 4.5 mmol/L (ref 3.5–5.5)
Sodium: 142 mmol/L (ref 136–145)
Total Bilirubin: 0.6 mg/dL (ref 0.2–1.0)
Total Protein: 6.8 g/dL (ref 6.4–8.2)

## 2023-12-17 LAB — HEMOGLOBIN A1C
Estimated Avg Glucose: 133 mg/dL
Hemoglobin A1C: 6.3 % — ABNORMAL HIGH (ref 4.2–5.6)

## 2023-12-22 ENCOUNTER — Ambulatory Visit
Admit: 2023-12-22 | Discharge: 2023-12-22 | Payer: Medicare (Managed Care) | Attending: Family Medicine | Primary: Family Medicine

## 2023-12-22 DIAGNOSIS — Z0000 Encounter for general adult medical examination without abnormal findings: Principal | ICD-10-CM

## 2023-12-22 NOTE — Progress Notes (Signed)
 Have you been to the ER, urgent care clinic since your last visit?  Hospitalized since your last visit?   NO    Have you seen or consulted any other health care providers outside our system since your last visit?   NO      "Have you had a diabetic eye exam?"    YES - Where: DR. GWENYTH  Nurse/CMA to request most recent records if not in the chart     Date of last diabetic eye exam: 02/16/2022

## 2023-12-22 NOTE — Progress Notes (Signed)
 Medicare Annual Wellness Visit    Cheryl Paul is here for Medicare AWV    Assessment & Plan   Medicare annual wellness visit, subsequent       No follow-ups on file.     Subjective       Patient's complete Health Risk Assessment and screening values have been reviewed and are found in Flowsheets. The following problems were reviewed today and where indicated follow up appointments were made and/or referrals ordered.    Positive Risk Factor Screenings with Interventions:    Fall Risk:  Do you feel unsteady or are you worried about falling? : (!) yes  2 or more falls in past year?: no  Fall with injury in past year?: no  Interventions:    Reviewed medications, home hazards, visual acuity, and co-morbidities that can increase risk for falls  See AVS for additional education material         Controlled Medication Review:    Today's Pain Level: Pain Score: Zero     Opioid Risk: (Low risk score <55) Opioid risk score: 4    Patient is low risk for opioid use disorder or overdose.    Last PDMP Mark as Reviewed:  Review User Review Instant Review Result                   Inactivity:  On average, how many days per week do you engage in moderate to strenuous exercise (like a brisk walk)?: 1 day (!) Abnormal  On average, how many minutes do you engage in exercise at this level?: 10 min  Interventions:  See AVS for additional education material     Abnormal BMI (obese):  Body mass index is 37.8 kg/m. (!) Abnormal  Interventions:  See AVS for additional education material        Dentist Screen:  Have you seen the dentist within the past year?: (!) No    Intervention:  Advised to schedule with their dentist    Hearing Screen:  Do you or your family notice any trouble with your hearing that hasn't been managed with hearing aids?: (!) Yes    Interventions:  Referred to ENT     Safety:  Do you have non-slip mats or non-slip surfaces or shower bars or grab bars in your shower or bathtub?: (!) No  Interventions:  See AVS for  additional education material     ADL's:   Patient reports needing help with:  Select all that apply: (!) Walking/Balance  Interventions:  See AVS for additional education material                  Objective   Vitals:    12/22/23 0833   BP: 138/88   Pulse: 77   Resp: 16   Temp: 96.8 F (36 C)   TempSrc: Temporal   SpO2: 99%   Weight: 96.8 kg (213 lb 6.4 oz)   Height: 1.6 m (5' 3)      Body mass index is 37.8 kg/m.                    Allergies   Allergen Reactions    Latex Rash    Acetaminophen-Codeine Nausea Only and Other (See Comments)     Nausea and pains in stomach    Codeine Other (See Comments)     Nausea and abd pain    Buprenorphine Rash    Environmental/Seasonal Hives     Conductive electrodes  Iodine Rash    Lactose Diarrhea     Lactose intolerance    Penicillins Itching    Adhesive Tape      Itching, redness     Prior to Visit Medications    Medication Sig Taking? Authorizing Provider   tiZANidine (ZANAFLEX) 2 MG tablet  Yes [provider]   vitamin D  (ERGOCALCIFEROL ) 1.25 MG (50000 UT) CAPS capsule Take 1 capsule by mouth every 7 days Yes An Lannan, Lebron SQUIBB, MD   cyanocobalamin  1000 MCG tablet Take 1 tablet by mouth daily Yes Derik Fults, Lebron SQUIBB, MD   thiamine  100 MG tablet Take 1 tablet by mouth daily Yes Hester Forget, Lebron SQUIBB, MD   ferrous sulfate  (IRON 325) 325 (65 Fe) MG tablet Take 1 tablet by mouth daily (with breakfast) Yes Vietta Bonifield, Lebron SQUIBB, MD   traMADol (ULTRAM) 50 MG tablet  Yes [provider]   potassium chloride  (KLOR-CON  M) 20 MEQ extended release tablet Take 2 tablets by mouth daily Yes Kassondra Geil, Lebron SQUIBB, MD   calcium  citrate (CALCITRATE) 950 (200 Ca) MG tablet Take 1 tablet by mouth daily Yes Wanell Lorenzi, Lebron SQUIBB, MD   spironolactone  (ALDACTONE ) 50 MG tablet Take 1 tablet by mouth daily Yes Shirleymae Hauth, Lebron SQUIBB, MD   nystatin  (MYCOSTATIN ) 100000 UNIT/GM cream Apply topically 2 times daily Yes Deairra Halleck P, MD   diclofenac sodium (VOLTAREN) 1 % GEL  Yes [provider]   blood glucose test strips (TRUE METRIX BLOOD GLUCOSE TEST) strip Check glucose level once daily Yes Maryl Blalock, Lebron SQUIBB, MD   TRUEplus Lancets 33G MISC Check glucose level once daily Yes Skylynn Burkley, Lebron SQUIBB, MD   Blood Glucose Monitoring Suppl (TRUE METRIX AIR GLUCOSE METER) w/Device KIT Check glucose level once daily Yes Asianae Minkler, Lebron SQUIBB, MD   aspirin 81 MG EC tablet Take 1 tablet by mouth daily Yes Automatic Reconciliation, Ar   magnesium oxide (MAG-OX) 400 MG tablet Take 1 tablet by mouth daily Yes Automatic Reconciliation, Ar   oxyCODONE HCl (OXY-IR) 10 MG immediate release tablet   [provider]       CareTeam (Including outside providers/suppliers regularly involved in providing care):   Patient Care Team:  Sherryll Lebron SQUIBB, MD as PCP - General  Sherryll, Lebron SQUIBB, MD as PCP - Empaneled Provider  Daryl Lonni LABOR, RN as Ambulatory Care Manager     Recommendations for Preventive Services Due: see orders and patient instructions/AVS.  Recommended screening schedule for the next 5-10 years is provided to the patient in written form: see Patient Instructions/AVS.     Reviewed and updated this visit:  Tobacco  Allergies  Meds  Med Hx  Surg Hx  Fam Hx  Sexual Hx           SUBJECTIVE  Ff-up    HTN- currently on aldactone    Hypokalemia- endo eval neg for hyperaldo, currently on aldactone  with  OTC kcl, normal K levels.     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  Reviewed malabsorption , on MVT replacement    DM-diet controlled, she does not check glucose at home. She is no longer on metformin.       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 Says weight loss improved respirotary symptoms. denies cough, sob, wheezing. She says last albuterol use was a year ago. Usually heat triggers respiratory symptoms.    Chronic OA knee pain- following [facility] pain management, new  provider  deescalating to tramadol    ROS:  See HPI, all others negative        Patient Active Problem List   Diagnosis Code    Chronic infection of sinus J32.9    Type 2 diabetes mellitus without complication (HCC) E11.9    Incontinence in female R32    Candidal intertrigo B37.2    Arthritis, multiple joint involvement M12.9    Essential hypertension I10    COPD (chronic obstructive pulmonary disease) (HCC) J44.9    Hyperlipidemia E78.5    Vitamin D  deficiency E55.9    Internal hemorrhoids K64.8    Colon polyps K63.5    Hypokalemia E87.6    Hypomagnesemia E83.42    Syncope R55    Elevated troponin R77.8    Short gut syndrome K91.2    Hypocalcemia E83.51    Mild intermittent asthma J45.20    Thrombocytopenia, unspecified D69.6    Arthritis of knee M17.10    Non-compliance Z91.199    Arthrofibrosis of knee joint, 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         Current Outpatient Medications:     tiZANidine (ZANAFLEX) 2 MG tablet, , Disp: , Rfl:     vitamin D  (ERGOCALCIFEROL ) 1.25 MG (50000 UT) CAPS capsule, Take 1 capsule by mouth every 7 days, Disp: 12 capsule, Rfl: 3    cyanocobalamin  1000 MCG tablet, Take 1 tablet by mouth daily, Disp: 90 tablet, Rfl: 3    thiamine  100 MG tablet, Take 1 tablet by mouth daily, Disp: 90 tablet, Rfl: 1    ferrous sulfate  (IRON 325) 325 (65 Fe) MG tablet, Take 1 tablet by mouth daily (with breakfast), Disp: 90 tablet, Rfl: 3    traMADol (ULTRAM) 50 MG tablet, , Disp: , Rfl:     potassium chloride  (KLOR-CON  M) 20 MEQ extended release tablet, Take 2 tablets by mouth daily, Disp: 180 tablet, Rfl: 1    calcium  citrate (CALCITRATE) 950 (200 Ca) MG tablet, Take 1 tablet by mouth daily, Disp: 90 tablet, Rfl: 3    spironolactone  (ALDACTONE ) 50 MG tablet, Take 1 tablet by mouth daily, Disp: 90 tablet, Rfl: 1    nystatin  (MYCOSTATIN ) 100000 UNIT/GM cream, Apply topically 2 times daily, Disp: 30 g, Rfl: 1    diclofenac sodium (VOLTAREN) 1 % GEL, , Disp: , Rfl:     blood glucose test strips (TRUE METRIX  BLOOD GLUCOSE TEST) strip, Check glucose level once daily, Disp: 100 strip, Rfl: 3    TRUEplus Lancets 33G MISC, Check glucose level once daily, Disp: 100 each, Rfl: 3    Blood Glucose Monitoring Suppl (TRUE METRIX AIR GLUCOSE METER) w/Device KIT, Check glucose level once daily, Disp: 1 kit, Rfl: 0    aspirin 81 MG EC tablet, Take 1 tablet by mouth daily, Disp: , Rfl:     magnesium oxide (MAG-OX) 400 MG tablet, Take 1 tablet by mouth daily, Disp: , Rfl:     oxyCODONE HCl (OXY-IR) 10 MG immediate release tablet, , Disp: , Rfl:         Allergies   Allergen Reactions    Latex Rash    Acetaminophen-Codeine Nausea Only and Other (comments)     Nausea and pains in stomach    Codeine Other (comments)     Nausea and abd pain    Butrans [Buprenorphine] Rash    Iodine Rash    Lactose Diarrhea     Lactose intolerance    Other Plant, Educational psychologist, Environmental Hives     Conductive electrodes  Penicillins Itching       Past Medical History:   Diagnosis Date    Arthritis     knee, arms,     Asthma     resolved after gastric bypass    Chronic obstructive pulmonary disease (HCC)     no meds, since gastric bypass    Chronic pain     Diabetes (HCC)     no meds    H/O sinusitis     Hx SBO     multiple surgeries prior to 2002    Hypercholesterolemia     Hypertension     Sleep apnea     NOT USING     Stress incontinence     Thrombocytopenia (HCC)     Toe fracture, left August 2015    4th toe       Social History     Socioeconomic History    Marital status: SINGLE     Spouse name: Not on file    Number of children: Not on file    Years of education: Not on file    Highest education level: Not on file   Occupational History    Not on file   Tobacco Use    Smoking status: Never    Smokeless tobacco: Never   Vaping Use    Vaping Use: Never used   Substance and Sexual Activity    Alcohol use: No     Alcohol/week: 0.0 standard drinks    Drug use: No    Sexual activity: Not Currently     Partners: Male   Other Topics Concern    Not on file    Social History Narrative    Retired Psychologist, occupational, 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 Denies history of smoking      Social Determinants of Psychologist, prison and probation services Strain: Low Risk     Difficulty of Paying Living Expenses: Not very hard   Food Insecurity: No Food Insecurity    Worried About Programme researcher, broadcasting/film/video in the Last Year: Never true    Ran Out of Food in the Last Year: Never true   Transportation Needs: Not on file   Physical Activity: Not on file   Stress: Not on file   Social Connections: Not on file   Intimate Partner Violence: Not on file   Housing Stability: Not on file       Family History   Problem Relation Age of Onset    Hypertension Mother     Stroke Mother     Diabetes Mother     Thyroid Disease Mother     Arthritis-rheumatoid Mother     Cancer Father         colon polpe    Cancer Maternal Aunt          OBJECTIVE    Physical Exam:     BP 138/88   Pulse 77   Temp 96.8 F (36 C) (Temporal)   Resp 16   Ht 1.6 m (5' 3)   Wt 96.8 kg (213 lb 6.4 oz)   SpO2 99%   BMI 37.80 kg/m         General: alert, well-appearing,obese,AA, in no apparent distress or pain  Head: Right brow with 2 sutures removed today   neck: supple, no adenopathy palpated  CVS: normal rate, regular rhythm, distinct S1 and S2  Lungs:clear to ausculation bilaterally, no crackles, wheezing or rhonchi noted  Feet: no lesions  Psych:  mood and affect normal  CMP:   Lab Results   Component Value Date/Time    NA 142 12/17/2023 09:08 AM    K 4.5 12/17/2023 09:08 AM    CL 107 12/17/2023 09:08 AM    CO2 23 12/17/2023 09:08 AM    BUN 13 12/17/2023 09:08 AM    CREATININE 0.74 12/17/2023 09:08 AM    GLUCOSE 106 12/17/2023 09:08 AM    GLUCOSE 109 06/17/2023 12:00 AM    CALCIUM  9.1 12/17/2023 09:08 AM    BILITOT 0.6 12/17/2023 09:08 AM    AST 23 12/17/2023 09:08 AM    ALT 21 12/17/2023 09:08 AM        CBC:   Lab Results   Component Value Date/Time    WBC 6.7 06/17/2023 12:00 AM    RBC 4.91 06/17/2023 12:00 AM    RBC 4.74  12/10/2021 10:40 AM    HGB 13.8 06/17/2023 12:00 AM    HCT 44.1 06/17/2023 12:00 AM    MCV 90 06/17/2023 12:00 AM    MCH 28.1 06/17/2023 12:00 AM    MCHC 31.3 06/17/2023 12:00 AM    RDW 13.0 06/17/2023 12:00 AM    PLT 163 06/17/2023 12:00 AM    MPV 12.6 03/17/2023 08:04 AM        Lipids   Lab Results   Component Value Date/Time    CHOL 177 06/17/2023 12:00 AM    TRIG 88 06/17/2023 12:00 AM    HDL 69 06/17/2023 12:00 AM    CHOLHDLRATIO 2.7 09/16/2022 11:38 AM         Imaging results last 24 hrs :MAM TOMO DIGITAL SCREEN BILATERAL    Result Date: 07/31/2021  BIRADS: 1-NEGATIVE BREAST DENSITY: C-The breasts are heterogeneously dense, which may obscure small masses. SCREENING MAMMOGRAM BILATERAL  3D tomosynthesis HISTORY: Screening. COMPARISON: 22 and other priors FINDINGS: 2D and 3D images were performed. Digital mammograms were performed. No suspicious microcalcifications, masses, or areas of architectural distortion. Interpretation performed in conjunction with computed assisted detection system.     No evidence of malignancy.  Suggest routine follow-up.      Imaging results impression onlyMAM TOMO DIGITAL SCREEN BILATERAL    Result Date: 07/31/2021  No evidence of malignancy.  Suggest routine follow-up.     No orders to display       A1c:   Hemoglobin A1C   Date Value Ref Range Status   12/17/2023 6.3 (H) 4.2 - 5.6 % Final     Comment:     Reference Range  Normal       <5.7%  Prediabetes  5.7-6.4%  Diabetes     >6.4%     06/17/2023 6.1 (H) 4.8 - 5.6 % Final     Comment:                 Prediabetes: 5.7 - 6.4           Diabetes: >6.4           Glycemic control for adults with diabetes: <7.0     03/17/2023 5.7 (H) 4.2 - 5.6 % Final     Comment:     (NOTE)  HbA1C Interpretive Ranges  <5.7              Normal  5.7 - 6.4         Consider Prediabetes  >6.5              Consider Diabetes  ASSESSMENT/PLAN  Diagnoses and all orders for this visit:     Essential hypertension  controlled w/ hypoKalemia issues  cont  aldactone  to 50 mg and OTC kcl (had has chronic hypoK issues since gastric bypass)  DASH diet, BP Log    Hypokalemia  See #1    Diabetes mellitus type 2, diet-controlled (HCC)  Off meds after gastric bypass 02/2018  Monitoring   -     HM DIABETES FOOT EXAM  -     Hemoglobin A1C; Future  -     Lipid Panel; Future  -     Albumin/Creatinine Ratio, Urine; Future  -     Comprehensive Metabolic Panel; Future    Mild intermittent asthma, unspecified whether complicated  Controlled  Cont prn albuterol      Pure hypercholesterolemia   off statin after wt loss/gastric bypass 2019  Monitoring    BMI 35-39 with comorbidity HCC  S/p gastric bypass    Vitamin D  deficiency   s/p gastric bypass  Cont 50 k weekly  Monitoring    S/p gastric bypass  W/ dumping syndrome/symptoms of dehydration/hypoglycemia erratic  Advised increase hydration, fiber,  protein on each meal    Chronic pain of both knee  Following pain management dr. Olegario    Iron deficiency  Status post gastric bypass  For oral iron supplement  -     ferrous sulfate  (IRON 325) 325 (65 Fe) MG tablet; Take 1 tablet by mouth daily (with breakfast)    Decreased hearing, unspecified laterality  -     External Referral To ENT    Intestinal malabsorption, unspecified type  -     Vitamin B1, Whole Blood; Future  -     Vitamin B12 & Folate; Future  -     Vitamin D  25 Hydroxy; Future  -     Iron and TIBC; Future  -     Ferritin; Future  -     CBC with Auto Differential; Future    Ff-up in 6 months    Patient understands plan of care. Patient has provided input and agrees with goals.

## 2023-12-22 NOTE — Patient Instructions (Signed)
 Preventing Falls: Care Instructions  Injuries and health problems such as trouble walking or poor eyesight can increase your risk of falling. So can some medicines. But there are things you can do to help prevent falls. You can exercise to get stronger. You can also arrange your home to make it safer.    Talk to your doctor about the medicines you take. Ask if any of them increase the risk of falls and whether they can be changed or stopped.   Try to exercise regularly. It can help improve your strength and balance. This can help lower your risk of falling.         Practice fall safety and prevention.   Wear low-heeled shoes that fit well and give your feet good support. Talk to your doctor if you have foot problems that make this hard.  Carry a cellphone or wear a medical alert device that you can use to call for help.  Use stepladders instead of chairs to reach high objects. Don't climb if you're at risk for falls. Ask for help, if needed.  Wear the correct eyeglasses, if you need them.        Make your home safer.   Remove rugs, cords, clutter, and furniture from walkways.  Keep your house well lit. Use night-lights in hallways and bathrooms.  Install and use sturdy handrails on stairways.  Wear nonskid footwear, even inside. Don't walk barefoot or in socks without shoes.        Be safe outside.   Use handrails, curb cuts, and ramps whenever possible.  Keep your hands free by using a shoulder bag or backpack.  Try to walk in well-lit areas. Watch out for uneven ground, changes in pavement, and debris.  Be careful in the winter. Walk on the grass or gravel when sidewalks are slippery. Use de-icer on steps and walkways. Add non-slip devices to shoes.    Put grab bars and nonskid mats in your shower or tub and near the toilet. Try to use a shower chair or bath bench when bathing.   Get into a tub or shower by putting in your weaker leg first. Get out with your strong side first. Have a phone or medical alert  device in the bathroom with you.   Where can you learn more?  Go to [employer] and enter G117 to learn more about Preventing Falls: Care Instructions.  Current as of: December 16, 2022  Content Version: 14.5   [address], [place].   Care instructions adapted under license by [facility]. If you have questions about a medical condition or this instruction, always ask your healthcare professional. Romayne Alderman, [facility], disclaims any warranty or liability for your use of this information.         Learning About Being Active as an Older Adult  Why is being active important as you get older?     Being active is one of the best things you can do for your health. And it's never too late to start. Being active--or getting active, if you aren't already--has definite benefits. It can:  Give you more energy,  Keep your mind sharp.  Improve balance to reduce your risk of falls.  Help you manage chronic illness with fewer medicines.  No matter how old you are, how fit you are, or what health problems you have, there is a form of activity that will work for you. And the more physical activity you can do, the better your overall  health will be.  What kinds of activity can help you stay healthy?  Being more active will make your daily activities easier. Physical activity includes planned exercise and things you do in daily life. There are four types of activity:  Aerobic.  Doing aerobic activity makes your heart and lungs strong.  Includes walking, dancing, and gardening.  Aim for at least 2 hours spread throughout the week.  It improves your energy and can help you sleep better.  Muscle-strengthening.  This type of activity can help maintain muscle and strengthen bones.  Includes climbing stairs, using resistance bands, and lifting or carrying heavy loads.  Aim for at least twice a week.  It can help protect the knees and other joints.  Stretching.  Stretching gives you better range of  motion in joints and muscles.  Includes upper arm stretches, calf stretches, and gentle yoga.  Aim for at least twice a week, preferably after your muscles are warmed up from other activities.  It can help you function better in daily life.  Balancing.  This helps you stay coordinated and have good posture.  Includes heel-to-toe walking, tai chi, and certain types of yoga.  Aim for at least 3 days a week.  It can reduce your risk of falling.  Even if you have a hard time meeting the recommendations, it's better to be more active than less active. All activity done in each category counts toward your weekly total. You'd be surprised how daily things like carrying groceries, keeping up with grandchildren, and taking the stairs can add up.  What keeps you from being active?  If you've had a hard time being more active, you're not alone. Maybe you remember being able to do more. Or maybe you've never thought of yourself as being active. It's frustrating when you can't do the things you want. Being more active can help. What's holding you back?  Getting started.  Have a goal, but break it into easy tasks. Small steps build into big accomplishments.  Staying motivated.  If you feel like skipping your activity, remember your goal. Maybe you want to move better and stay independent. Every activity gets you one step closer.  Not feeling your best.  Start with 5 minutes of an activity you enjoy. Prove to yourself you can do it. As you get comfortable, increase your time.  You may not be where you want to be. But you're in the process of getting there. Everyone starts somewhere.  How can you find safe ways to stay active?  Talk with your doctor about any physical challenges you're facing. Make a plan with your doctor if you have a health problem or aren't sure how to get started with activity.  If you're already active, ask your doctor if there is anything you should change to stay safe as your body and health change.  If you  tend to feel dizzy after you take medicine, avoid activity at that time. Try being active before you take your medicine. This will reduce your risk of falls.  If you plan to be active at home, make sure to clear your space before you get started. Remove things like TV cords, coffee tables, and throw rugs. It's safest to have plenty of space to move freely.  The key to getting more active is to take it slow and steady. Try to improve only a little bit at a time. Pick just one area to improve on at first. And if an activity  hurts, stop and talk to your doctor.  Where can you learn more?  Go to [employer] and enter P600 to learn more about Learning About Being Active as an Older Adult.  Current as of: December 16, 2022  Content Version: 14.5   [address] [place].   Care instructions adapted under license by [facility]. If you have questions about a medical condition or this instruction, always ask your healthcare professional. Romayne Alderman, [facility], disclaims any warranty or liability for your use of this information.         Learning About Dental Care for Older Adults  Dental care for older adults: Overview  Dental care for older people is much the same as for younger adults. But older adults do have concerns that younger adults do not. Older adults may have problems with gum disease and decay on the roots of their teeth. They may need missing teeth replaced or broken fillings fixed. Or they may have dentures that need to be cared for. Some older adults may have trouble holding a toothbrush.  You can help remind the person you are caring for to brush and floss their teeth or to clean their dentures. In some cases, you may need to do the brushing and other dental care tasks. People who have trouble using their hands or who have dementia may need this extra help.  How can you help with dental care?  Normal dental care  To keep the teeth and gums healthy:  Brush the teeth with  fluoride toothpaste twice a day--in the morning and at night--and floss at least once a day. Plaque can quickly build up on the teeth of older adults.  Watch for the signs of gum disease. These signs include gums that bleed after brushing or after eating hard foods, such as apples.  See a dentist regularly. Many experts recommend checkups every 6 months.  Keep the dentist up to date on any new medications the person is taking.  Encourage a balanced diet that includes whole grains, vegetables, and fruits, and that is low in saturated fat and sodium.  Encourage the person you're caring for not to use tobacco products. They can affect dental and general health.  Many older adults have a fixed income and feel that they can't afford dental care. But most towns and cities have programs in which dentists help older adults by lowering fees. Contact your area's public health offices or social services for information about dental care in your area.  Using a toothbrush  Older adults with arthritis sometimes have trouble brushing their teeth because they can't easily hold the toothbrush. Their hands and fingers may be stiff, painful, or weak. If this is the case, you can:  Offer an Mining engineer toothbrush.  Enlarge the handle of a non-electric toothbrush by wrapping a sponge, an elastic bandage, or adhesive tape around it.  Push the toothbrush handle through a ball made of rubber or soft foam.  Make the handle longer and thicker by taping Popsicle sticks or tongue depressors to it.  You may also be able to buy special toothbrushes, toothpaste dispensers, and floss holders.  Your doctor may recommend a soft-bristle toothbrush if the person you care for bleeds easily. Bleeding can happen because of a health problem or from certain medicines.  A toothpaste for sensitive teeth may help if the person you care for has sensitive teeth.  How do you brush and floss someone's teeth?  If the person you are caring for has  a hard time cleaning  their teeth on their own, you may need to brush and floss their teeth for them. It may be easiest to have the person sit and face away from you, and to sit or stand behind them. That way you can steady their head against your arm as you reach around to floss and brush their teeth. Choose a place that has good lighting and is comfortable for both of you.  Before you begin, gather your supplies. You will need gloves, floss, a toothbrush, and a container to hold water if you are not near a sink. Wash and dry your hands well and put on gloves. Start by flossing:  Gently work a piece of floss between each of the teeth toward the gums. A plastic flossing tool may make this easier, and they are available at most drugstores.  Curve the floss around each tooth into a U-shape and gently slide it under the gum line.  Move the floss firmly up and down several times to scrape off the plaque.  After you've finished flossing, throw away the used floss and begin brushing:  Wet the brush and apply toothpaste.  Place the brush at a 45-degree angle where the teeth meet the gums. Press firmly, and move the brush in small circles over the surface of the teeth.  Be careful not to brush too hard. Vigorous brushing can make the gums pull away from the teeth and can scratch the tooth enamel.  Brush all surfaces of the teeth, on the tongue side and on the cheek side. Pay special attention to the front teeth and all surfaces of the back teeth.  Brush chewing surfaces with short back-and-forth strokes.  After you've finished, help the person rinse the remaining toothpaste from their mouth.  Where can you learn more?  Go to [employer] and enter F944 to learn more about Learning About Dental Care for Older Adults.  Current as of: December 16, 2022  Content Version: 14.5   [address], [place].   Care instructions adapted under license by [facility]. If you have questions about a medical condition or this  instruction, always ask your healthcare professional. Romayne Alderman, [facility], disclaims any warranty or liability for your use of this information.         Hearing Loss: Care Instructions  Overview     Hearing loss is a sudden or slow decrease in how well you hear. It can range from slight to profound. Permanent hearing loss can occur with aging. It also can happen when you are exposed long-term to loud noise. Examples include listening to loud music, riding motorcycles, or being around other loud machines.  Hearing loss can affect your work and home life. It can make you feel lonely or depressed. You may feel that you have lost your independence. But hearing aids and other devices can help you hear better and feel connected to others.  Follow-up care is a key part of your treatment and safety. Be sure to make and go to all appointments, and call your doctor if you are having problems. It's also a good idea to know your test results and keep a list of the medicines you take.  How can you care for yourself at home?  Avoid loud noises whenever possible. This helps keep your hearing from getting worse.  Always wear hearing protection around loud noises.  Wear a hearing aid as directed.  A professional can help you pick a hearing aid that will  work best for you.  You can also get hearing aids over the counter for mild to moderate hearing loss.  Have hearing tests as your doctor suggests. They can show whether your hearing has changed. Your hearing aid may need to be adjusted.  Use other devices as needed. These may include:  Telephone amplifiers and hearing aids that can connect to a television, stereo, radio, or microphone.  Devices that use lights or vibrations. These alert you to the doorbell, a ringing telephone, or a baby monitor.  Television closed-captioning. This shows the words at the bottom of the screen. Most new TVs can do this.  TTY (text telephone). This lets you type messages back and forth on the  telephone instead of talking or listening. These devices are also called TDD. When messages are typed on the keyboard, they are sent over the phone line to a receiving TTY. The message is shown on a monitor.  Use text messaging, social media, and email if it is hard for you to communicate by telephone.  Try to learn a listening technique called speechreading. It is not lipreading. You pay attention to people's gestures, expressions, posture, and tone of voice. These clues can help you understand what a person is saying. Face the person you are talking to, and have them face you. Make sure the lighting is good. You need to see the other person's face clearly.  Think about counseling if you need help to adjust to your hearing loss.  When should you call for help?  Watch closely for changes in your health, and be sure to contact your doctor if:    You think your hearing is getting worse.     You have new symptoms, such as dizziness or nausea.   Where can you learn more?  Go to [employer] and enter R798 to learn more about Hearing Loss: Care Instructions.  Current as of: March 14, 2023  Content Version: 14.5   [address] [place].   Care instructions adapted under license by [facility]. If you have questions about a medical condition or this instruction, always ask your healthcare professional. Romayne Alderman, [facility], disclaims any warranty or liability for your use of this information.         Learning About Activities of Daily Living  What are activities of daily living?     Activities of daily living (ADLs) are the basic self-care tasks you do every day. These include eating, bathing, dressing, and moving around.  As you age, and if you have health problems, you may find that it's harder to do some of these tasks. If so, your doctor can suggest ideas that may help.  To measure what kind of help you may need, your doctor will ask how well you are able to do ADLs. Let your  doctor know if there are any tasks that you are having trouble doing. This is an important first step to getting help. And when you have the help you need, you can stay as independent as possible.  How will a doctor assess your ADLs?  Asking about ADLs is part of a routine health checkup your doctor will likely do as you age. Your health check might be done in a doctor's office, in your home, or at a hospital. The goal is to find out if you are having any problems that could make it hard to care for yourself or that make it unsafe for you to be on your own.  To  measure your ADLs, your doctor will ask how hard it is for you to do routine tasks. Your doctor may also want to know if you have changed the way you do a task because of a health problem. Your doctor may watch how you:  Walk back and forth.  Keep your balance while you stand or walk.  Move from sitting to standing or from a bed to a chair.  Button or unbutton a [employer] fast streamer.  Remove and put on your shoes.  It's common to feel a little worried or anxious if you find you can't do all the things you used to be able to do. Talking with your doctor about ADLs is a way to make sure you're as safe as possible and able to care for yourself as well as you can. You may want to bring a caregiver, friend, or family member to your checkup. They can help you talk to your doctor.  Follow-up care is a key part of your treatment and safety. Be sure to make and go to all appointments, and call your doctor if you are having problems. It's also a good idea to know your test results and keep a list of the medicines you take.  Current as of: March 11, 2023  Content Version: 14.5   [address] .   Care instructions adapted under license by [facility]. If you have questions about a medical condition or this instruction, always ask your healthcare professional. Romayne Alderman, [facility], disclaims any warranty or liability for your use of this  information.         Starting a Weight-Loss Plan: Care Instructions  Overview    It can be a challenge to lose weight. But your doctor can help you make a weight-loss plan that meets your needs.  You don't have to make a lot of big changes at once. A better idea might be to focus on small changes and stick with them. When those changes become habit, you can add a few more changes.  Some people find it helpful to take an exercise or nutrition class. If you have questions, ask your doctor about seeing a registered dietitian or an exercise specialist. You might also think about joining a weight-loss support group.  If you're not ready to make changes right now, try to pick a date in the future. Then make an appointment with your doctor to talk about when and how you'll get started with a plan.  Follow-up care is a key part of your treatment and safety. Be sure to make and go to all appointments, and call your doctor if you are having problems. It's also a good idea to know your test results and keep a list of the medicines you take.  How can you care for yourself as you start a weight-loss plan?   Set realistic goals. Many people expect to lose much more weight than is likely. A weight loss of 5% to 10% of your body weight may be enough to improve your health.  Get family and friends involved to provide support. Talk to them about why you are trying to lose weight, and ask them to help. They can help by participating in exercise and having meals with you, even if they may be eating something different.  Find what works best for you. If you do not have time or do not like to cook, a program that offers meal replacement bars or shakes may be better for you.  Or if you like to prepare meals, finding a plan that includes daily menus and recipes may be best.  Ask your doctor about other health professionals who can help you achieve your weight-loss goals.  A dietitian can help you make healthy changes in your diet.  An  exercise specialist or personal trainer can help you develop a safe and effective exercise program.  A counselor or psychiatrist can help you cope with issues such as depression, anxiety, or family problems that can make it hard to focus on weight loss.  Consider joining a support group for people who are trying to lose weight. Your doctor can suggest groups in your area.  Where can you learn more?  Go to [employer] and enter U357 to learn more about Starting a Weight-Loss Plan: Care Instructions.  Current as of: September 15, 2022  Content Version: 14.5   [address] [place].   Care instructions adapted under license by [facility]. If you have questions about a medical condition or this instruction, always ask your healthcare professional. Romayne Alderman, [facility], disclaims any warranty or liability for your use of this information.         A Healthy Heart: Care Instructions  Overview     Coronary artery disease, also called heart disease, occurs when a substance called plaque builds up in the vessels that supply oxygen-rich blood to your heart muscle. This can narrow the blood vessels and reduce blood flow. A heart attack happens when blood flow is completely blocked. A high-fat diet, smoking, and other factors increase the risk of heart disease.  Your doctor has found that you have a chance of having heart disease. A heart-healthy lifestyle can help keep your heart healthy and prevent heart disease. This lifestyle includes eating healthy, being active, staying at a weight that's healthy for you, and not smoking or using tobacco. It also includes taking medicines as directed, managing other health conditions, and trying to get a healthy amount of sleep.  Follow-up care is a key part of your treatment and safety. Be sure to make and go to all appointments, and call your doctor if you are having problems. It's also a good idea to know your test results and keep a list of the  medicines you take.  How can you care for yourself at home?  Diet    Use less salt when you cook and eat. This helps lower your blood pressure. Taste food before salting. Add only a little salt when you think you need it. With time, your taste buds will adjust to less salt.     Eat fewer snack items, fast foods, canned soups, and other high-salt, high-fat, processed foods.     Read food labels and try to avoid saturated and trans fats. They increase your risk of heart disease by raising cholesterol levels.     Limit the amount of solid fat--butter, margarine, and shortening--you eat. Use olive, peanut, or canola oil when you cook. Bake, broil, and steam foods instead of frying them.     Eat a variety of fruit and vegetables every day. Dark green, deep orange, red, or yellow fruits and vegetables are especially good for you. Examples include spinach, carrots, peaches, and berries.     Foods high in fiber can reduce your cholesterol and provide important vitamins and minerals. High-fiber foods include whole-grain cereals and breads, oatmeal, beans, brown rice, citrus fruits, and apples.     Eat lean proteins. Heart-healthy proteins include seafood, lean  meats and poultry, eggs, beans, peas, nuts, seeds, and soy products.     Limit drinks and foods with added sugar. These include candy, desserts, and soda pop.   Heart-healthy lifestyle    If your doctor recommends it, get more exercise. For many people, walking is a good choice. Or you may want to swim, bike, or do other activities. Bit by bit, increase the time you're active every day. Try for at least 30 minutes on most days of the week.     Try to quit or cut back on using tobacco and other nicotine products. This includes smoking and vaping. If you need help quitting, talk to your doctor about stop-smoking programs and medicines. These can increase your chances of quitting for good. Quitting is one of the most important things you can do to protect your heart. It  is never too late to quit. Try to avoid secondhand smoke too.     Stay at a weight that's healthy for you. Talk to your doctor if you need help losing weight.     Try to get 7 to 9 hours of sleep each night.     Limit alcohol to 2 drinks a day for men and 1 drink a day for women. Too much alcohol can cause health problems.     Manage other health problems such as diabetes, high blood pressure, and high cholesterol. If you think you may have a problem with alcohol or drug use, talk to your doctor.   Medicines    Take your medicines exactly as prescribed. Call your doctor if you think you are having a problem with your medicine.     If your doctor recommends aspirin, take the amount directed each day. Make sure you take aspirin and not another kind of pain reliever, such as acetaminophen (Tylenol).   When should you call for help?   Call 911 if you have symptoms of a heart attack. These may include:    Chest pain or pressure, or a strange feeling in the chest.     Sweating.     Shortness of breath.     Pain, pressure, or a strange feeling in the back, neck, jaw, or upper belly or in one or both shoulders or arms.     Lightheadedness or sudden weakness.     A fast or irregular heartbeat.   After you call 911, the operator may tell you to chew 1 adult-strength or 2 to 4 low-dose aspirin. Wait for an ambulance. Do not try to drive yourself.  Watch closely for changes in your health, and be sure to contact your doctor if you have any problems.  Where can you learn more?  Go to [employer] and enter F075 to learn more about A Healthy Heart: Care Instructions.  Current as of: December 16, 2022  Content Version: 14.5   [address], [place].   Care instructions adapted under license by [facility]. If you have questions about a medical condition or this instruction, always ask your healthcare professional. Romayne Alderman, [facility], disclaims any warranty or liability for your use of this  information.    Personalized Preventive Plan for Cheryl Paul - 12/22/2023  Medicare offers a range of preventive health benefits. Some of the tests and screenings are paid in full while other may be subject to a deductible, co-insurance, and/or copay.  Some of these benefits include a comprehensive review of your medical history including lifestyle, illnesses that may run in your  family, and various assessments and screenings as appropriate.  After reviewing your medical record and screening and assessments performed today your provider may have ordered immunizations, labs, imaging, and/or referrals for you.  A list of these orders (if applicable) as well as your Preventive Care list are included within your After Visit Summary for your review.

## 2023-12-24 NOTE — Telephone Encounter (Signed)
 Patient si calling because she spoke with her insurance company and a Rolator and a scooter are covered under her plan. Wanted to let you know so it can be ordered.

## 2023-12-24 NOTE — Telephone Encounter (Signed)
**Note De-identified  [place] Obfuscation** Please advise 

## 2023-12-27 ENCOUNTER — Encounter: Payer: Medicare (Managed Care) | Attending: Cardiovascular Disease | Primary: Family Medicine

## 2023-12-29 NOTE — Telephone Encounter (Addendum)
 Orders have been faxed to [facility]

## 2024-01-13 ENCOUNTER — Encounter

## 2024-01-13 MED ORDER — VITAMIN D (ERGOCALCIFEROL) 1.25 MG (50000 UT) PO CAPS
1.25 | ORAL_CAPSULE | ORAL | 3 refills | 84.00000 days | Status: DC
Start: 2024-01-13 — End: 2024-02-15

## 2024-01-13 NOTE — Telephone Encounter (Signed)
 This patient contacted office for the following prescriptions to be filled:    Medication requested :  vitamin d2    PCP:  pascual  Pharmacy or Print:  [facility]  Mail order or Local pharmacy [address]   Last office visit 12/22/23  Next office visit 06/26/24

## 2024-01-13 NOTE — Telephone Encounter (Signed)
 Last office visit 12/22/23  Next office visit 06/26/24   Last Vit D 06/17/2023

## 2024-01-13 NOTE — Telephone Encounter (Signed)
 Patient also said she was able to get the Rolator but they wouldn't let her get the scooter. Patient has questions about next steps.

## 2024-02-15 ENCOUNTER — Encounter

## 2024-02-15 MED ORDER — CYANOCOBALAMIN 1000 MCG PO TABS
1000 | ORAL_TABLET | Freq: Every day | ORAL | 3 refills | 30.00000 days | Status: AC
Start: 2024-02-15 — End: ?

## 2024-02-15 MED ORDER — VITAMIN D (ERGOCALCIFEROL) 1.25 MG (50000 UT) PO CAPS
1.25 | ORAL_CAPSULE | ORAL | 3 refills | 84.00000 days | Status: DC
Start: 2024-02-15 — End: 2024-02-22

## 2024-02-15 MED ORDER — THIAMINE HCL 100 MG PO TABS
100 | ORAL_TABLET | Freq: Every day | ORAL | 3 refills | 30.00000 days | Status: AC
Start: 2024-02-15 — End: ?

## 2024-02-15 MED ORDER — POTASSIUM CHLORIDE CRYS ER 20 MEQ PO TBCR
20 | ORAL_TABLET | Freq: Every day | ORAL | 1 refills | 65.00000 days | Status: DC
Start: 2024-02-15 — End: 2024-02-22

## 2024-02-15 NOTE — Telephone Encounter (Signed)
 This patient contacted office for the following prescriptions to be filled:    Medication requested : vitamin D  (ERGOCALCIFEROL ) 1.25 MG (50000 UT) CAPS capsule QTY 12 Refills 3    thiamine  100 MG tablet  QTY 90 Refill 3      cyanocobalamin  1000 MCG tablet QTY 90 Refill 3    potassium chloride  (KLOR-CON  M) 20 MEQ extended release tablet QTY 180 Refill3  PCP: pascual  Pharmacy or Print: [facility]  Mail order or Local pharmacy Mail Order   Last office visit 12/22/2023  Next office visit 06/26/2024   Changing to [facility]

## 2024-02-15 NOTE — Telephone Encounter (Signed)
 Last OV 08.06.25  Next OV 02.09.25  Last lab 08.01.25

## 2024-02-21 NOTE — Telephone Encounter (Signed)
"  This patient contacted office for the following prescriptions to be filled:    Medication requested : Potassium, Vitamin D2   PCP:  Dr. Sherryll   Pharmacy or Print:  [facility]  Mail order or Local pharmacy Mail Order     Scheduled appointment if not seen by current providers in office: F/u 06/27/2023  "

## 2024-02-21 NOTE — Telephone Encounter (Signed)
 Last OV 08.06.25  Next OV 02.09.25  Last lab 08.01.25

## 2024-02-22 ENCOUNTER — Encounter

## 2024-02-22 MED ORDER — VITAMIN D (ERGOCALCIFEROL) 1.25 MG (50000 UT) PO CAPS
1.25 | ORAL_CAPSULE | ORAL | 3 refills | 84.00000 days | Status: DC
Start: 2024-02-22 — End: 2024-03-01

## 2024-02-22 MED ORDER — POTASSIUM CHLORIDE CRYS ER 20 MEQ PO TBCR
20 | ORAL_TABLET | Freq: Every day | ORAL | 1 refills | Status: AC
Start: 2024-02-22 — End: ?

## 2024-03-01 ENCOUNTER — Encounter

## 2024-03-01 NOTE — Telephone Encounter (Signed)
"  Pt's insurance will not pay for Vit D2 50,000 units. Can you please send the RX to [facility] on [address] so she can use the Good [facility] . The pharmacy has been updated in the system. Thank you  "

## 2024-03-02 MED ORDER — VITAMIN D (ERGOCALCIFEROL) 1.25 MG (50000 UT) PO CAPS
1.25 | ORAL_CAPSULE | ORAL | 3 refills | Status: AC
Start: 2024-03-02 — End: ?

## 2024-03-02 NOTE — Telephone Encounter (Signed)
"  Fax received from [employer] stating prior auth is required for the   vitamin D  (ERGOCALCIFEROL ) 1.25 MG (50000 UT) CAPS capsule  .  Submit a PA request  1. Go to key.[email or web address] and click Enter a Key  2. Patient last name:  Cheryl Paul      DOB:  August 22, 1950      Key: [id number]  3. Click start a PA, complete the form, and send to plan     "

## 2024-03-23 NOTE — Telephone Encounter (Signed)
"  [facility] states that the scooter order needs to go to another dme company. Pt states that she was able to get the rollator. Please advise.   "

## 2024-04-08 ENCOUNTER — Other Ambulatory Visit (HOSPITAL_COMMUNITY): Payer: Self-pay | Admitting: Psychiatry

## 2024-04-08 DIAGNOSIS — F330 Major depressive disorder, recurrent, mild: Secondary | ICD-10-CM

## 2024-04-10 ENCOUNTER — Ambulatory Visit
Admit: 2024-04-10 | Discharge: 2024-04-10 | Payer: Medicare (Managed Care) | Attending: Family Medicine | Primary: Family Medicine

## 2024-04-10 VITALS — BP 128/78 | HR 74 | Temp 97.70000°F | Resp 16 | Ht 63.0 in | Wt 214.0 lb

## 2024-04-10 DIAGNOSIS — L729 Follicular cyst of the skin and subcutaneous tissue, unspecified: Principal | ICD-10-CM

## 2024-04-10 NOTE — Progress Notes (Addendum)
"  Have you been to the ER, urgent care clinic since your last visit?  Hospitalized since your last visit?   NO    Have you seen or consulted any other health care providers outside our system since your last visit?   [facility], Dr. Carolin LOV: 02/23/24 & 12/24/23.  [facility], Dr. Inocente Eagles LOV: 03/15/2024.    Chief Complaint   Patient presents with    Knot in the back    Leg Pain     Left leg pain            "

## 2024-04-10 NOTE — Progress Notes (Signed)
 "    Cheryl Paul (DOB:  10-01-1950) is a 73 y.o. female, Established patient, here for evaluation of the following chief complaint(s):  Knot in the back and Leg Pain (Left leg pain/)         Assessment & Plan  1. Sebaceous cyst:  - A small sebaceous cyst was noted under the skin over the middle lumbar spine. There is no discharge, redness, or swelling around it.  - The plan is to monitor the cyst for any changes. She is advised to keep an eye on it and follow up with Dr. Ena if there are any concerns.    2. Chronic pain:  - She experiences chronic pain in the left leg, left hip, and upper thigh.  - She is advised to continue using a heating pad on the left lateral thigh and to discuss the effectiveness of her current pain medications with her pain management doctor.  - If needed, physical therapy can be considered along with pain management.    Results    1. Skin cyst  Comments:  over mid lumbar spine  2. Other chronic pain  Comments:  left lateral thigh , pain management    Return for as scheduled with PCP .       Subjective   History of Present Illness  The patient presents for evaluation of a knot in her back and chronic pain.    Back Knot and Pain  - She has identified a palpable mass in her back, described as a knot, accompanied by a burning sensation.  - The mass is located in the midline of her back and occasionally causes stinging discomfort.  - The severity of the pain is such that it impedes her ability to turn over.  - She reports no loss of urinary or bowel control, and no symptoms of leg tingling, numbness, or weakness.    Medication and Pain Management  - She has a history of knee replacement surgery and is currently under the care of a pain management specialist.  - Prior to the current specialist's intervention, she was managed by another physician who prescribed oxycodone and half a tablet of Tylenol PM, which she found beneficial.  - Upon transitioning to the current specialist's care, her  medication regimen was altered to include tramadol and a muscle relaxer, which she reports as ineffective.  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  - Post-surgery, she was advised to undergo right knee and hip replacement.  - She is currently only seeing the pain management specialist.  - She had been attending physical therapy for approximately 6 months post-surgery but was advised to discontinue.    PAST SURGICAL HISTORY:  - Knee replacement surgery    Review of Systems       Objective   Blood pressure 128/78, pulse 74, temperature 97.7 F (36.5 C), temperature source Temporal, resp. rate 16, height 1.6 m (5' 3), weight 97.1 kg (214 lb), SpO2 98%.  Physical Exam  General Appearance: sitting comfortable without any acute distress. able to talk in full sentance  Vital signs: stable.   Neurological: awake alert and oriented. no focal neurological deficit  Psychiatric: behaviour fair  Other observations: Skin: A small sebaceous cyst is present under the skin over the middle lumbar spine. No discharge, redness, or swelling noted around it.           The patient (or guardian, if applicable) and other individuals in attendance with the patient  were advised that Artificial Intelligence will be utilized during this visit to record, process the conversation to generate a clinical note and to support improvement of the AI technology. The patient (or guardian, if applicable) and other individuals in attendance at the appointment consented to the use of AI, including the recording.      An electronic signature was used to authenticate this note.    --Dirk JONELLE Fairly, MD       "

## 2024-04-11 ENCOUNTER — Other Ambulatory Visit (HOSPITAL_COMMUNITY): Payer: Self-pay | Admitting: Psychiatry

## 2024-04-11 DIAGNOSIS — F330 Major depressive disorder, recurrent, mild: Secondary | ICD-10-CM

## 2024-05-01 ENCOUNTER — Encounter: Admission: RE | Disposition: A | Payer: Self-pay | Attending: Gastroenterology

## 2024-05-01 ENCOUNTER — Encounter: Payer: Self-pay | Admitting: Gastroenterology

## 2024-05-01 ENCOUNTER — Ambulatory Visit: Admitting: Anesthesiology

## 2024-05-01 ENCOUNTER — Ambulatory Visit
Admission: RE | Admit: 2024-05-01 | Discharge: 2024-05-01 | Disposition: A | Attending: Gastroenterology | Admitting: Gastroenterology

## 2024-05-01 DIAGNOSIS — K2950 Unspecified chronic gastritis without bleeding: Secondary | ICD-10-CM | POA: Insufficient documentation

## 2024-05-01 DIAGNOSIS — Z79899 Other long term (current) drug therapy: Secondary | ICD-10-CM | POA: Diagnosis not present

## 2024-05-01 DIAGNOSIS — D509 Iron deficiency anemia, unspecified: Secondary | ICD-10-CM | POA: Insufficient documentation

## 2024-05-01 DIAGNOSIS — E039 Hypothyroidism, unspecified: Secondary | ICD-10-CM | POA: Diagnosis not present

## 2024-05-01 DIAGNOSIS — D122 Benign neoplasm of ascending colon: Secondary | ICD-10-CM | POA: Diagnosis not present

## 2024-05-01 DIAGNOSIS — K31A12 Gastric intestinal metaplasia without dysplasia, involving the body (corpus): Secondary | ICD-10-CM | POA: Insufficient documentation

## 2024-05-01 DIAGNOSIS — K219 Gastro-esophageal reflux disease without esophagitis: Secondary | ICD-10-CM | POA: Diagnosis present

## 2024-05-01 DIAGNOSIS — Z8673 Personal history of transient ischemic attack (TIA), and cerebral infarction without residual deficits: Secondary | ICD-10-CM | POA: Insufficient documentation

## 2024-05-01 DIAGNOSIS — J449 Chronic obstructive pulmonary disease, unspecified: Secondary | ICD-10-CM | POA: Insufficient documentation

## 2024-05-01 DIAGNOSIS — D128 Benign neoplasm of rectum: Secondary | ICD-10-CM | POA: Diagnosis not present

## 2024-05-01 DIAGNOSIS — F419 Anxiety disorder, unspecified: Secondary | ICD-10-CM | POA: Diagnosis not present

## 2024-05-01 DIAGNOSIS — K5730 Diverticulosis of large intestine without perforation or abscess without bleeding: Secondary | ICD-10-CM | POA: Insufficient documentation

## 2024-05-01 DIAGNOSIS — Z87891 Personal history of nicotine dependence: Secondary | ICD-10-CM | POA: Diagnosis not present

## 2024-05-01 DIAGNOSIS — K3189 Other diseases of stomach and duodenum: Secondary | ICD-10-CM | POA: Insufficient documentation

## 2024-05-01 DIAGNOSIS — M1990 Unspecified osteoarthritis, unspecified site: Secondary | ICD-10-CM | POA: Insufficient documentation

## 2024-05-01 DIAGNOSIS — K449 Diaphragmatic hernia without obstruction or gangrene: Secondary | ICD-10-CM | POA: Insufficient documentation

## 2024-05-01 HISTORY — PX: POLYPECTOMY: SHX149

## 2024-05-01 HISTORY — PX: ESOPHAGOGASTRODUODENOSCOPY: SHX5428

## 2024-05-01 HISTORY — PX: COLONOSCOPY: SHX5424

## 2024-05-01 SURGERY — COLONOSCOPY
Anesthesia: General

## 2024-05-01 MED ORDER — LIDOCAINE HCL (CARDIAC) PF 100 MG/5ML IV SOSY
PREFILLED_SYRINGE | INTRAVENOUS | Status: DC | PRN
  Administered 2024-05-01: 13:00:00 60 mg via INTRAVENOUS

## 2024-05-01 MED ORDER — GLYCOPYRROLATE 0.2 MG/ML IJ SOLN
INTRAMUSCULAR | Status: AC
  Filled 2024-05-01: qty 1

## 2024-05-01 MED ORDER — SODIUM CHLORIDE 0.9 % IV SOLN: INTRAVENOUS | Status: DC

## 2024-05-01 MED ORDER — PROPOFOL 10 MG/ML IV BOLUS
INTRAVENOUS | Status: DC | PRN
  Administered 2024-05-01 (×2): 50 mg via INTRAVENOUS

## 2024-05-01 MED ORDER — GLYCOPYRROLATE 0.2 MG/ML IJ SOLN
INTRAMUSCULAR | Status: DC | PRN
  Administered 2024-05-01: 13:00:00 .2 mg via INTRAVENOUS

## 2024-05-01 MED ORDER — DEXMEDETOMIDINE HCL IN NACL 80 MCG/20ML IV SOLN
INTRAVENOUS | Status: DC | PRN
  Administered 2024-05-01 (×3): 4 ug via INTRAVENOUS
  Administered 2024-05-01: 13:00:00 8 ug via INTRAVENOUS

## 2024-05-01 MED ORDER — EPHEDRINE 5 MG/ML INJ
INTRAVENOUS | Status: AC
  Filled 2024-05-01: qty 5

## 2024-05-01 MED ORDER — PROPOFOL 500 MG/50ML IV EMUL
INTRAVENOUS | Status: DC | PRN
  Administered 2024-05-01: 13:00:00 75 ug/kg/min via INTRAVENOUS

## 2024-05-01 NOTE — Op Note (Signed)
 [facility] Patient Name: Brittany [name] Procedure Date: 05/01/2024 1:03 PM MRN: [id number] Account #: [id number] Date of Birth: [birth year] Admit Type: Outpatient Age: 73 Room: [facility] ENDO ROOM 2 Gender: Female Note Status: Finalized Instrument Name: Colon Scope [id number] Procedure:             Colonoscopy Indications:           Iron  deficiency anemia Providers:             Elspeth Ozell Jungling DO, DO Referring MD:          Jerona CHARLENA Sayre MD (Referring MD) Medicines:             Monitored Anesthesia Care Complications:         No immediate complications. Estimated blood loss:                         Minimal. Procedure:             Pre-Anesthesia Assessment:                        - Prior to the procedure, a History and Physical was                         performed, and patient medications and allergies were                         reviewed. The patient is competent. The risks and                         benefits of the procedure and the sedation options and                         risks were discussed with the patient. All questions                         were answered and informed consent was obtained.                         Patient identification and proposed procedure were                         verified by the physician, the nurse, the anesthetist                         and the technician in the endoscopy suite. Mental                         Status Examination: alert and oriented. Airway                         Examination: normal oropharyngeal airway and neck                         mobility. Respiratory Examination: clear to                         auscultation. CV Examination: RRR, no murmurs, no S3  or S4. Prophylactic Antibiotics: The patient does not                         require prophylactic antibiotics. Prior                         Anticoagulants: The patient has taken no anticoagulant                          or antiplatelet agents. ASA Grade Assessment: III - A                         patient with severe systemic disease. After reviewing                         the risks and benefits, the patient was deemed in                         satisfactory condition to undergo the procedure. The                         anesthesia plan was to use monitored anesthesia care                         (MAC). Immediately prior to administration of                         medications, the patient was re-assessed for adequacy                         to receive sedatives. The heart rate, respiratory                         rate, oxygen saturations, blood pressure, adequacy of                         pulmonary ventilation, and response to care were                         monitored throughout the procedure. The physical                         status of the patient was re-assessed after the                         procedure.                        After obtaining informed consent, the colonoscope was                         passed under direct vision. Throughout the procedure,                         the patient's blood pressure, pulse, and oxygen                         saturations were monitored continuously. The  Colonoscope was introduced through the anus and                         advanced to the the terminal ileum, with                         identification of the appendiceal orifice and IC                         valve. The colonoscopy was performed without                         difficulty. The patient tolerated the procedure well.                         The quality of the bowel preparation was evaluated                         using the BBPS [facility] Bowel Preparation Scale) with                         scores of: Right Colon = 3, Transverse Colon = 3 and                         Left Colon = 3 (entire mucosa seen well with no                         residual staining, small  fragments of stool or opaque                         liquid). The total BBPS score equals 9. The terminal                         ileum, ileocecal valve, appendiceal orifice, and                         rectum were photographed. Findings:      The perianal and digital rectal examinations were normal. Pertinent       negatives include normal sphincter tone.      The terminal ileum appeared normal. Estimated blood loss: none.      Retroflexion in the right colon was performed.      A 1 to 2 mm polyp was found in the rectum. The polyp was sessile. The       polyp was removed with a jumbo cold forceps. Resection and retrieval       were complete. Estimated blood loss was minimal.      A 3 to 4 mm polyp was found in the ascending colon. The polyp was       sessile. The polyp was removed with a cold snare. Resection and       retrieval were complete. Estimated blood loss was minimal.      Multiple small-mouthed diverticula were found in the sigmoid colon.       Estimated blood loss: none.      The exam was otherwise without abnormality on direct and retroflexion       views. Impression:            - The  examined portion of the ileum was normal.                        - One 1 to 2 mm polyp in the rectum, removed with a                         jumbo cold forceps. Resected and retrieved.                        - One 3 to 4 mm polyp in the ascending colon, removed                         with a cold snare. Resected and retrieved.                        - Diverticulosis in the sigmoid colon.                        - The examination was otherwise normal on direct and                         retroflexion views. Recommendation:        - Patient has a contact number available for                         emergencies. The signs and symptoms of potential                         delayed complications were discussed with the patient.                         Return to normal activities tomorrow. Written                          discharge instructions were provided to the patient.                        - Discharge patient to home.                        - Resume previous diet.                        - Continue present medications.                        - Await pathology results.                        - Repeat colonoscopy for surveillance based on                         pathology results.                        - Return to GI office as previously scheduled.                        - If iron  deficiency does not respond to iron   supplementation, consider Video Capsule endoscopy. Can                         consider CT Enterography in the interim if concern for                         small bowel lesion and not recently scanned.                        - The findings and recommendations were discussed with                         the patient. Procedure Code(s):     --- Professional ---                        [phone], Colonoscopy, flexible; with removal of                         tumor(s), polyp(s), or other lesion(s) by snare                         technique                        45380, 59, Colonoscopy, flexible; with biopsy, single                         or multiple Diagnosis Code(s):     --- Professional ---                        D12.8, Benign neoplasm of rectum                        D12.2, Benign neoplasm of ascending colon                        D50.9, Iron  deficiency anemia, unspecified                        K57.30, Diverticulosis of large intestine without                         perforation or abscess without bleeding CPT copyright 2022 American Medical Association. All rights reserved. The codes documented in this report are preliminary and upon coder review may  be revised to meet current compliance requirements. Attending Participation:      I personally performed the entire procedure. Elspeth Jungling, DO Elspeth Ozell Jungling DO, DO 05/01/2024 1:50:31  PM This report has been signed electronically. Number of Addenda: 0 Note Initiated On: 05/01/2024 1:03 PM Scope Withdrawal Time: 0 hours 13 minutes 4 seconds  Total Procedure Duration: 0 hours 16 minutes 39 seconds  Estimated Blood Loss:  Estimated blood loss was minimal.      [facility]

## 2024-05-01 NOTE — Op Note (Signed)
 [facility] Gastroenterology Patient Name: Brittany [name] Procedure Date: 05/01/2024 1:03 PM MRN: [id number] Account #: [id number] Date of Birth: 1950/07/17 Admit Type: Outpatient Age: 73 Room: [facility] ENDO ROOM 2 Gender: Female Note Status: Supervisor Override Instrument Name: Barnie GI Scope [id number] Procedure:             Upper GI endoscopy Indications:           Iron  deficiency anemia, GERD Providers:             Elspeth Ozell Onita ROSALEA, DO Referring MD:          Jerona CHARLENA Sayre MD (Referring MD) Medicines:             Monitored Anesthesia Care Complications:         No immediate complications. Estimated blood loss:                         Minimal. Procedure:             Pre-Anesthesia Assessment:                        - Prior to the procedure, a History and Physical was                         performed, and patient medications and allergies were                         reviewed. The patient is competent. The risks and                         benefits of the procedure and the sedation options and                         risks were discussed with the patient. All questions                         were answered and informed consent was obtained.                         Patient identification and proposed procedure were                         verified by the physician, the nurse, the anesthetist                         and the technician in the endoscopy suite. Mental                         Status Examination: alert and oriented. Airway                         Examination: normal oropharyngeal airway and neck                         mobility. Respiratory Examination: clear to                         auscultation. CV Examination: RRR, no murmurs, no S3  or S4. Prophylactic Antibiotics: The patient does not                         require prophylactic antibiotics. Prior                         Anticoagulants: The patient has taken no  anticoagulant                         or antiplatelet agents. ASA Grade Assessment: III - A                         patient with severe systemic disease. After reviewing                         the risks and benefits, the patient was deemed in                         satisfactory condition to undergo the procedure. The                         anesthesia plan was to use monitored anesthesia care                         (MAC). Immediately prior to administration of                         medications, the patient was re-assessed for adequacy                         to receive sedatives. The heart rate, respiratory                         rate, oxygen saturations, blood pressure, adequacy of                         pulmonary ventilation, and response to care were                         monitored throughout the procedure. The physical                         status of the patient was re-assessed after the                         procedure.                        After obtaining informed consent, the endoscope was                         passed under direct vision. Throughout the procedure,                         the patient's blood pressure, pulse, and oxygen                         saturations were monitored continuously. The Endoscope  was introduced through the mouth, and advanced to the                         third part of duodenum. The upper GI endoscopy was                         accomplished without difficulty. The patient tolerated                         the procedure well. Findings:      The duodenal bulb, first portion of the duodenum, second portion of the       duodenum and third portion of the duodenum were normal. Biopsies for       histology were taken with a cold forceps for evaluation of celiac       disease. Estimated blood loss was minimal.      Diffuse atrophic mucosa was found in the entire examined stomach.       Imaging was performed using  white light and narrow band imaging to       visualize the mucosa. Biopsies were taken with a cold forceps for       histology. Biopsies taken from antrum/incisure and body. placed in       separate jars. Estimated blood loss was minimal.      A 2 cm hiatal hernia was present. Estimated blood loss: none.      The exam of the stomach was otherwise normal.      The examined esophagus was normal. Estimated blood loss: none.      The Z-line was regular. Estimated blood loss: none. Impression:            - Normal duodenal bulb, first portion of the duodenum,                         second portion of the duodenum and third portion of                         the duodenum. Biopsied.                        - Gastric mucosal atrophy. Biopsied.                        - 2 cm hiatal hernia.                        - Normal esophagus.                        - Z-line regular. Recommendation:        - Patient has a contact number available for                         emergencies. The signs and symptoms of potential                         delayed complications were discussed with the patient.                         Return to normal activities tomorrow. Written  discharge instructions were provided to the patient.                        - Discharge patient to home.                        - Resume previous diet.                        - Continue present medications.                        - Await pathology results.                        - Repeat upper endoscopy for surveillance based on                         pathology results.                        - Return to referring physician as previously                         scheduled.                        - The findings and recommendations were discussed with                         the patient. Procedure Code(s):     --- Professional ---                        [phone], Esophagogastroduodenoscopy, flexible,                          transoral; with biopsy, single or multiple Diagnosis Code(s):     --- Professional ---                        K31.89, Other diseases of stomach and duodenum                        K44.9, Diaphragmatic hernia without obstruction or                         gangrene                        D50.9, Iron  deficiency anemia, unspecified CPT copyright 2022 American [employer] Association. All rights reserved. The codes documented in this report are preliminary and upon coder review may  be revised to meet current compliance requirements. Attending Participation:      I personally performed the entire procedure. Elspeth Jungling, DO Elspeth Ozell Jungling DO, DO 05/01/2024 1:26:16 PM This report has been signed electronically. Number of Addenda: 0 Note Initiated On: 05/01/2024 1:03 PM Estimated Blood Loss:  Estimated blood loss was minimal.      [facility]

## 2024-05-01 NOTE — Anesthesia Postprocedure Evaluation (Signed)
 Anesthesia Post Note  Patient: Brittany [name]  Procedure(s) Performed: COLONOSCOPY EGD (ESOPHAGOGASTRODUODENOSCOPY) POLYPECTOMY, INTESTINE  Patient location during evaluation: PACU Anesthesia Type: General Level of consciousness: awake and alert Pain management: satisfactory to patient Vital Signs Assessment: post-procedure vital signs reviewed and stable Respiratory status: spontaneous breathing Cardiovascular status: stable Anesthetic complications: no   No notable events documented.   Last Vitals:  Vitals:   05/01/24 1409 05/01/24 1414  BP: (!) 94/58 (!) 92/52  Pulse: 60 61  Resp: 15 13  Temp:    SpO2: 100% 100%    Last Pain:  Vitals:   05/01/24 1414  TempSrc:   PainSc: 0-No pain                 VAN STAVEREN,Tijah Hane

## 2024-05-01 NOTE — Interval H&P Note (Signed)
 History and Physical Interval Note: Preprocedure H&P from 05/01/2024  was reviewed and there was no interval change after seeing and examining the patient.  Written consent was obtained from the patient after discussion of risks, benefits, and alternatives. Patient has consented to proceed with Esophagogastroduodenoscopy and Colonoscopy with possible intervention   05/01/2024 1:09 PM  Brittany [name]  has presented today for surgery, with the diagnosis of Iron  deficiency anemia, unspecified iron  deficiency anemia type (D50.9).  The various methods of treatment have been discussed with the patient and family. After consideration of risks, benefits and other options for treatment, the patient has consented to  Procedures: COLONOSCOPY (N/A) EGD (ESOPHAGOGASTRODUODENOSCOPY) (N/A) as a surgical intervention.  [name] history has been reviewed, patient examined, no change in status, stable for surgery.  I have reviewed the patient's chart and labs.  Questions were answered to the patient's satisfaction.     Elspeth Ozell Jungling

## 2024-05-01 NOTE — Transfer of Care (Signed)
 Immediate Anesthesia Transfer of Care Note  Patient: Brittany [name]  Procedure(s) Performed: COLONOSCOPY EGD (ESOPHAGOGASTRODUODENOSCOPY) POLYPECTOMY, INTESTINE  Patient Location: PACU  Anesthesia Type:General  Level of Consciousness: sedated  Airway & Oxygen Therapy: Patient Spontanous Breathing  Post-op Assessment: Report given to RN and Post -op Vital signs reviewed and stable  Post vital signs: Reviewed and stable  Last Vitals:  Vitals Value Taken Time  BP 80/41 05/01/24 13:50  Temp 36.1 C 05/01/24 13:49  Pulse 73 05/01/24 13:51  Resp 18 05/01/24 13:51  SpO2 99 % 05/01/24 13:51  Vitals shown include unfiled device data.  Last Pain:  Vitals:   05/01/24 1349  TempSrc: Temporal  PainSc: Asleep         Complications: No notable events documented.

## 2024-05-01 NOTE — Anesthesia Preprocedure Evaluation (Signed)
 Anesthesia Evaluation  Patient identified by MRN, date of birth, ID band Patient awake    Reviewed: Allergy & Precautions, NPO status , Patient's Chart, lab work & pertinent test results  History of Anesthesia Complications (+) PONV and history of anesthetic complications  Airway Mallampati: II  TM Distance: >3 FB Neck ROM: full    Dental  (+) Partial Upper, Partial Lower   Pulmonary neg pulmonary ROS, COPD,  COPD inhaler, former smoker   Pulmonary exam normal breath sounds clear to auscultation       Cardiovascular Exercise Tolerance: Good negative cardio ROS Normal cardiovascular exam Rhythm:Regular Rate:Normal     Neuro/Psych   Anxiety     TIAnegative neurological ROS  negative psych ROS   GI/Hepatic negative GI ROS, Neg liver ROS, hiatal hernia,GERD  Medicated,,  Endo/Other  negative endocrine ROSHypothyroidism    Renal/GU negative Renal ROS  negative genitourinary   Musculoskeletal  (+) Arthritis ,    Abdominal   Peds negative pediatric ROS (+)  Hematology negative hematology ROS (+) Blood dyscrasia, anemia   Anesthesia Other Findings Past Medical History: No date: Allergic genetic state No date: Anemia No date: Anxiety No date: Arthritis No date: Cataract No date: Chicken pox 09/05/2018: Clostridioides difficile infection No date: Complication of anesthesia No date: Depression No date: Family history of adverse reaction to anesthesia     Comment:  mother - PONV No date: GERD (gastroesophageal reflux disease) No date: H/O hiatal hernia No date: History of GI bleed No date: Hypercholesteremia No date: Hyperlipidemia No date: Hypothyroidism No date: Osteoporosis No date: PONV (postoperative nausea and vomiting) 11/2017: Stroke (HCC) No date: Thyroid  disease No date: Wears dentures     Comment:  partial upper, full lower  Past Surgical History: 05/18/2000: ABDOMINAL HYSTERECTOMY     Comment:   total 05/26/2012: BRAVO PH STUDY     Comment:  Procedure: BRAVO PH STUDY;  Surgeon: Jerrell KYM Sol, MD;  Location: [facility] ENDOSCOPY;  Service:               Endoscopy;  Laterality: N/A; 10/13/2022: CARPOMETACARPAL (CMC) FUSION OF THUMB; Left     Comment:  Procedure: Left thumb carpometacarpal arthroplasty;                Surgeon: Kathlynn Sharper, MD;  Location: [facility] SURGERY               [facility];  Service: Orthopedics;  Laterality: Left; No date: CATARACT EXTRACTION 05/18/2004: CHOLECYSTECTOMY 10/24/2018: COLONOSCOPY WITH PROPOFOL ; N/A     Comment:  Procedure: COLONOSCOPY WITH PROPOFOL ;  Surgeon: Viktoria Lamar DASEN, MD;  Location: [facility] ENDOSCOPY;  Service:               Endoscopy;  Laterality: N/A; 08/14/2021: COLONOSCOPY WITH PROPOFOL ; N/A     Comment:  Procedure: COLONOSCOPY WITH PROPOFOL ;  Surgeon: Onita Elspeth Sharper, DO;  Location: [name] ENDOSCOPY;  Service:               Gastroenterology;  Laterality: N/A; No date: detatched retina surgery 05/26/2012: ESOPHAGOGASTRODUODENOSCOPY     Comment:  Procedure: ESOPHAGOGASTRODUODENOSCOPY (EGD);  Surgeon:               Jerrell KYM Sol, MD;  Location: [facility] ENDOSCOPY;  Service: Endoscopy;  Laterality: N/A; 05/30/2012: ESOPHAGOGASTRODUODENOSCOPY     Comment:  Procedure: ESOPHAGOGASTRODUODENOSCOPY (EGD);  Surgeon:               Lamar LULLA Bunk, MD;  Location: [facility];  Service:               Endoscopy;  Laterality: N/A; 08/31/2012: ESOPHAGOGASTRODUODENOSCOPY; N/A     Comment:  Procedure: ESOPHAGOGASTRODUODENOSCOPY (EGD);  Surgeon:               Lesta JULIANNA Fitz, MD;  Location: [facility] ENDOSCOPY;  Service:               Endoscopy;  Laterality: N/A; 10/24/2018: ESOPHAGOGASTRODUODENOSCOPY (EGD) WITH PROPOFOL ; N/A     Comment:  Procedure: ESOPHAGOGASTRODUODENOSCOPY (EGD) WITH               PROPOFOL ;  Surgeon: Viktoria Lamar DASEN, MD;  Location:               [facility] ENDOSCOPY;  Service: Endoscopy;   Laterality: N/A; 08/05/2020: ESOPHAGOGASTRODUODENOSCOPY (EGD) WITH PROPOFOL ; N/A     Comment:  Procedure: ESOPHAGOGASTRODUODENOSCOPY (EGD) WITH               PROPOFOL ;  Surgeon: Toledo, Ladell POUR, MD;  Location:               [facility] ENDOSCOPY;  Service: Gastroenterology;  Laterality:               N/A; 08/14/2021: ESOPHAGOGASTRODUODENOSCOPY (EGD) WITH PROPOFOL ; N/A     Comment:  Procedure: ESOPHAGOGASTRODUODENOSCOPY (EGD) WITH               PROPOFOL ;  Surgeon: Onita Elspeth Sharper, DO;  Location:              [facility] ENDOSCOPY;  Service: Gastroenterology;  Laterality:               N/A; 07/01/2023: ESOPHAGOGASTRODUODENOSCOPY (EGD) WITH PROPOFOL ; N/A     Comment:  Procedure: ESOPHAGOGASTRODUODENOSCOPY (EGD) WITH               PROPOFOL ;  Surgeon: Onita Elspeth Sharper, DO;  Location:              [facility] ENDOSCOPY;  Service: Gastroenterology;  Laterality:               N/A; No date: EYE SURGERY No date: FOOT SURGERY; Right 1970s: HIP SURGERY     Comment:  hip sunk in No date: left eye surgery for detached retina 2007: LIVER BIOPSY 05/18/2010: NECK SURGERY 10/10/2020: PLANTAR FASCIA RELEASE; Left     Comment:  Procedure: ENDOSCOPIC PLANTAR FASC. RELEASE;  Surgeon:               Lennie Barter, DPM;  Location: [facility] SURGERY [facility];                Service: Podiatry;  Laterality: Left;  ANESTHESIA- CHOICE 03/18/2012: ROTATOR CUFF REPAIR     Comment:  right side No date: THYROID  SURGERY     Comment:  nodule removed, right side 05/18/1990: TMJ ARTHROPLASTY 05/19/1979: TUBAL LIGATION  BMI    Body Mass Index: 22.27 kg/m      Reproductive/Obstetrics negative OB ROS                              Anesthesia Physical Anesthesia Plan  ASA: 3  Anesthesia Plan: General   Post-op Pain Management:    Induction: Intravenous  PONV Risk Score and Plan: Propofol  infusion and TIVA  Airway Management Planned: Natural Airway and Nasal Cannula  Additional Equipment:    Intra-op Plan:   Post-operative Plan:   Informed Consent: I have reviewed the patients History and Physical, chart, labs and discussed the procedure including the risks, benefits and alternatives for the proposed anesthesia with the patient or authorized representative who has indicated his/her understanding and acceptance.     Dental Advisory Given  Plan Discussed with: CRNA  Anesthesia Plan Comments:         Anesthesia Quick Evaluation

## 2024-05-01 NOTE — H&P (Signed)
 Pre-Procedure H&P   Patient ID: Brittany [name] is a 73 y.o. female.  Gastroenterology Provider: Elspeth Ozell Jungling, DO  Referring Provider: Romero Antigua, PA PCP: Diedra Lame, MD  Date: 05/01/2024  HPI Ms. Brittany [name] is a 73 y.o. female who presents today for Esophagogastroduodenoscopy and Colonoscopy for Iron  deficiency anemia .  Patient has noted worsening reflux and regurgitation.  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  This was also demonstrated in 2023 in the antrum  EGD in 2025 demonstrating hiatal hernia  Colonoscopy in 2020 normal, 2023 negative biopsies for microscopic colitis normal TI  Iron  sat down to 5% hemoglobin 11.4 which is close to baseline MCV 99 platelets 236,000 creatinine 0.9  Sister with colon polyps   Past Medical History:  Diagnosis Date   Allergic genetic state    Anemia    Anxiety    Arthritis    Cataract    Chicken pox    Clostridioides difficile infection 09/05/2018   Complication of anesthesia    Depression    Family history of adverse reaction to anesthesia    mother - PONV   GERD (gastroesophageal reflux disease)    H/O hiatal hernia    History of GI bleed    Hypercholesteremia    Hyperlipidemia    Hypothyroidism    Osteoporosis    PONV (postoperative nausea and vomiting)    Stroke (HCC) 11/2017   Thyroid  disease    Wears dentures    partial upper, full lower    Past Surgical History:  Procedure Laterality Date   ABDOMINAL HYSTERECTOMY  05/18/2000   total   BRAVO PH STUDY  05/26/2012   Procedure: BRAVO PH STUDY;  Surgeon: Jerrell KYM Sol, MD;  Location: [facility];  Service: Endoscopy;  Laterality: N/A;   CARPOMETACARPAL (CMC) FUSION OF THUMB Left 10/13/2022   Procedure: Left thumb carpometacarpal arthroplasty;  Surgeon: Kathlynn Ozell, MD;  Location: [facility];  Service: Orthopedics;  Laterality: Left;    CATARACT EXTRACTION     CHOLECYSTECTOMY  05/18/2004   COLONOSCOPY WITH PROPOFOL  N/A 10/24/2018   Procedure: COLONOSCOPY WITH PROPOFOL ;  Surgeon: Viktoria Lamar DASEN, MD;  Location: [facility] END[facility]COPY;  Service: Endoscopy;  Laterality: N/A;   COLONOSCOPY WITH PROPOFOL  N/A 08/14/2021   Procedure: COLONOSCOPY WITH PROPOFOL ;  Surgeon: Jungling Elspeth Ozell, DO;  Location: [facility] ENDOSCOPY;  Service: Gastroenterology;  Laterality: N/A;   detatched retina surgery     ESOPHAGOGASTRODUODENOSCOPY  05/26/2012   Procedure: ESOPHAGOGASTRODUODENOSCOPY (EGD);  Surgeon: Jerrell KYM Sol, MD;  Location: [facility];  Service: Endoscopy;  Laterality: N/A;   ESOPHAGOGASTRODUODENOSCOPY  05/30/2012   Procedure: ESOPHAGOGASTRODUODENOSCOPY (EGD);  Surgeon: Lamar LULLA Bunk, MD;  Location: [facility];  Service: Endoscopy;  Laterality: N/A;   ESOPHAGOGASTRODUODENOSCOPY N/A 08/31/2012   Procedure: ESOPHAGOGASTRODUODENOSCOPY (EGD);  Surgeon: Lesta JULIANNA Fitz, MD;  Location: [facility] ENDOSCOPY;  Service: Endoscopy;  Laterality: N/A;   ESOPHAGOGASTRODUODENOSCOPY (EGD) WITH PROPOFOL  N/A 10/24/2018   Procedure: ESOPHAGOGASTRODUODENOSCOPY (EGD) WITH PROPOFOL ;  Surgeon: Viktoria Lamar DASEN, MD;  Location: [facility] ENDOSCOPY;  Service: Endoscopy;  Laterality: N/A;   ESOPHAGOGASTRODUODENOSCOPY (EGD) WITH PROPOFOL  N/A 08/05/2020   Procedure: ESOPHAGOGASTRODUODENOSCOPY (EGD) WITH PROPOFOL ;  Surgeon: Toledo, Ladell POUR, MD;  Location: [facility] ENDOSCOPY;  Service: Gastroenterology;  Laterality: N/A;   ESOPHAGOGASTRODUODENOSCOPY (EGD) WITH PROPOFOL  N/A 08/14/2021   Procedure: ESOPHAGOGASTRODUODENOSCOPY (EGD) WITH PROPOFOL ;  Surgeon: Jungling Elspeth Ozell, DO;  Location: [facility] ENDOSCOPY;  Service: Gastroenterology;  Laterality: N/A;   ESOPHAGOGASTRODUODENOSCOPY (  EGD) WITH PROPOFOL  N/A 07/01/2023   Procedure: ESOPHAGOGASTRODUODENOSCOPY (EGD) WITH PROPOFOL ;  Surgeon: Onita Elspeth Sharper, DO;  Location: [facility] ENDOSCOPY;  Service: Gastroenterology;  Laterality:  N/A;   EYE SURGERY     FOOT SURGERY Right    HIP SURGERY  1970s   hip sunk in   left eye surgery for detached retina     LIVER BIOPSY  2007   NECK SURGERY  05/18/2010   PLANTAR FASCIA RELEASE Left 10/10/2020   Procedure: ENDOSCOPIC PLANTAR FASC. RELEASE;  Surgeon: Lennie Barter, DPM;  Location: [facility];  Service: Podiatry;  Laterality: Left;  ANESTHESIA- CHOICE   ROTATOR CUFF REPAIR  03/18/2012   right side   THYROID  SURGERY     nodule removed, right side   TMJ ARTHROPLASTY  05/18/1990   TUBAL LIGATION  05/19/1979    Family History Sister- colon polyps No other h/o GI disease or malignancy  Review of Systems  Constitutional:  Negative for activity change, appetite change, chills, diaphoresis, fatigue, fever and unexpected weight change.  HENT:  Negative for trouble swallowing and voice change.   Respiratory:  Negative for shortness of breath and wheezing.   Cardiovascular:  Negative for chest pain, palpitations and leg swelling.  Gastrointestinal:  Negative for abdominal distention, abdominal pain, anal bleeding, blood in stool, constipation, diarrhea, nausea, rectal pain and vomiting.  Musculoskeletal:  Negative for arthralgias and myalgias.  Skin:  Negative for color change and pallor.  Neurological:  Negative for dizziness, syncope and weakness.  Psychiatric/Behavioral:  Negative for confusion.   All other systems reviewed and are negative.    Medications Medications Ordered Prior to Encounter[1]  Pertinent medications related to GI and procedure were reviewed by me with the patient prior to the procedure  Current Medications[2]  sodium chloride  20 mL/hr at 05/01/24 1240       Allergies[3] Allergies were reviewed by me prior to the procedure  Objective   Body mass index is 22.27 kg/m. Vitals:   05/01/24 1212 05/01/24 1224  BP:  130/76  Pulse:  62  Resp:  18  Temp:  (!) 97 F (36.1 C)  TempSrc:  Temporal  SpO2:  99%  Weight: 62.6 kg    Height: 5' 6 (1.676 m)      Physical Exam Vitals and nursing note reviewed.  Constitutional:      General: She is not in acute distress.    Appearance: Normal appearance. She is not ill-appearing, toxic-appearing or diaphoretic.  HENT:     Head: Normocephalic and atraumatic.     Nose: Nose normal.     Mouth/Throat:     Mouth: Mucous membranes are moist.     Pharynx: Oropharynx is clear.  Eyes:     General: No scleral icterus.    Extraocular Movements: Extraocular movements intact.  Cardiovascular:     Rate and Rhythm: Normal rate and regular rhythm.     Heart sounds: Normal heart sounds. No murmur heard.    No friction rub. No gallop.  Pulmonary:     Effort: Pulmonary effort is normal. No respiratory distress.     Breath sounds: Normal breath sounds. No wheezing, rhonchi or rales.  Abdominal:     General: Abdomen is flat. Bowel sounds are normal. There is no distension.     Palpations: Abdomen is soft.     Tenderness: There is no abdominal tenderness. There is no guarding or rebound.  Musculoskeletal:     Cervical back: Neck supple.     Right  lower leg: No edema.     Left lower leg: No edema.  Skin:    General: Skin is warm and dry.     Coloration: Skin is not jaundiced or pale.  Neurological:     General: No focal deficit present.     Mental Status: She is alert and oriented to person, place, and time. Mental status is at baseline.  Psychiatric:        Mood and Affect: Mood normal.        Behavior: Behavior normal.        Thought Content: Thought content normal.        Judgment: Judgment normal.      Assessment:  Ms. Brittany [name] is a 73 y.o. female  who presents today for Esophagogastroduodenoscopy and Colonoscopy for Iron  deficiency anemia .  Plan:  Esophagogastroduodenoscopy and Colonoscopy with possible intervention today  Esophagogastroduodenoscopy and Colonoscopy with possible biopsy, control of bleeding, polypectomy, and interventions as necessary  has been discussed with the patient/patient representative. Informed consent was obtained from the patient/patient representative after explaining the indication, nature, and risks of the procedure including but not limited to death, bleeding, perforation, missed neoplasm/lesions, cardiorespiratory compromise, and reaction to medications. Opportunity for questions was given and appropriate answers were provided. Patient/patient representative has verbalized understanding is amenable to undergoing the procedure.   Elspeth Ozell Jungling, DO  [facility]  Portions of the record may have been created with voice recognition software. Occasional wrong-word or 'sound-a-like' substitutions may have occurred due to the inherent limitations of voice recognition software.  Read the chart carefully and recognize, using context, where substitutions may have occurred.     [1]  No current facility-administered medications on file prior to encounter.   Current Outpatient Medications on File Prior to Encounter  Medication Sig Dispense Refill   atorvastatin  (LIPITOR) 40 MG tablet Take 40 mg by mouth daily.     brimonidine (ALPHAGAN) 0.15 % ophthalmic solution SMARTSIG:In Eye(s)     escitalopram  (LEXAPRO ) 10 MG tablet Take 1 tablet (10 mg total) by mouth daily. 90 tablet 0   famotidine  (PEPCID ) 40 MG tablet Take 40 mg by mouth daily.     levothyroxine  (SYNTHROID , LEVOTHROID) 25 MCG tablet Take 25 mcg by mouth every morning.      montelukast  (SINGULAIR ) 10 MG tablet Take 10 mg by mouth at bedtime.     pantoprazole  (PROTONIX ) 40 MG tablet Take 40 mg by mouth daily.     pregabalin  (LYRICA ) 50 MG capsule Take 1 capsule (50 mg total) by mouth 2 (two) times daily. 60 capsule 5   timolol (TIMOPTIC) 0.5 % ophthalmic solution SMARTSIG:In Eye(s)     betamethasone dipropionate 0.05 % cream Apply topically daily.     Cholecalciferol 50 MCG (2000 UT) TABS Take 2,000 Units by mouth daily.      clorazepate  (TRANXENE ) 7.5 MG tablet Take 7.5 mg by mouth. (Patient not taking: Reported on 05/01/2024)     DORZOLAMIDE HCL OP Apply to eye 2 (two) times daily.     traZODone  (DESYREL ) 100 MG tablet Take 1 tablet (100 mg total) by mouth at bedtime. 90 tablet 1   vitamin B-12 (CYANOCOBALAMIN ) 1000 MCG tablet Take 1,000 mcg by mouth daily.    [2]  Current Facility-Administered Medications:    0.9 %  sodium chloride  infusion, , Intravenous, Continuous, Jungling Elspeth Ozell, DO, Last Rate: 20 mL/hr at 05/01/24 1240, Continued from Pre-op at 05/01/24 1240 [3]  Allergies Allergen Reactions   Lithium  Anaphylaxis   Morphine Sulfate Nausea And Vomiting   Fentanyl  Other (See Comments)    Extreme Sedation  Other reaction(s): Lethargy (intolerance)    Morphine Nausea And Vomiting   Pseudoephedrine Hypertension        Pseudoephedrine Hcl Nausea Only and Hypertension

## 2024-05-02 ENCOUNTER — Encounter: Payer: Self-pay | Admitting: Gastroenterology

## 2024-05-02 LAB — SURGICAL PATHOLOGY

## 2024-06-15 NOTE — Telephone Encounter (Signed)
 Incoming call received from Ms. Joshua requesting for nurse sarina to give her a call.

## 2024-06-22 ENCOUNTER — Inpatient Hospital Stay: Admit: 2024-06-22 | Primary: Family Medicine

## 2024-06-22 LAB — LABCORP SPECIMEN COLLECTION

## 2024-06-23 LAB — SPECIMEN STATUS REPORT

## 2024-06-26 ENCOUNTER — Encounter: Payer: Medicare (Managed Care) | Attending: Family Medicine | Primary: Family Medicine
